# Patient Record
Sex: Female | Born: 1935 | Race: White | Hispanic: No | Smoking: Never smoker
Health system: Southern US, Community
[De-identification: ages and names within clinical notes are randomized; demographics above are authoritative.]

## PROBLEM LIST (undated history)

## (undated) DIAGNOSIS — J45909 Unspecified asthma, uncomplicated: Secondary | ICD-10-CM

## (undated) DIAGNOSIS — D649 Anemia, unspecified: Secondary | ICD-10-CM

## (undated) DIAGNOSIS — G47419 Narcolepsy without cataplexy: Secondary | ICD-10-CM

## (undated) DIAGNOSIS — J189 Pneumonia, unspecified organism: Secondary | ICD-10-CM

## (undated) DIAGNOSIS — K649 Unspecified hemorrhoids: Secondary | ICD-10-CM

## (undated) DIAGNOSIS — Z9889 Other specified postprocedural states: Secondary | ICD-10-CM

## (undated) DIAGNOSIS — K5790 Diverticulosis of intestine, part unspecified, without perforation or abscess without bleeding: Secondary | ICD-10-CM

## (undated) DIAGNOSIS — K635 Polyp of colon: Secondary | ICD-10-CM

## (undated) DIAGNOSIS — F32A Depression, unspecified: Secondary | ICD-10-CM

## (undated) DIAGNOSIS — G459 Transient cerebral ischemic attack, unspecified: Secondary | ICD-10-CM

## (undated) DIAGNOSIS — M51369 Other intervertebral disc degeneration, lumbar region without mention of lumbar back pain or lower extremity pain: Secondary | ICD-10-CM

## (undated) DIAGNOSIS — I499 Cardiac arrhythmia, unspecified: Secondary | ICD-10-CM

## (undated) DIAGNOSIS — E039 Hypothyroidism, unspecified: Secondary | ICD-10-CM

## (undated) DIAGNOSIS — T4145XA Adverse effect of unspecified anesthetic, initial encounter: Secondary | ICD-10-CM

## (undated) DIAGNOSIS — I2510 Atherosclerotic heart disease of native coronary artery without angina pectoris: Secondary | ICD-10-CM

## (undated) DIAGNOSIS — M5136 Other intervertebral disc degeneration, lumbar region: Secondary | ICD-10-CM

## (undated) DIAGNOSIS — I10 Essential (primary) hypertension: Secondary | ICD-10-CM

## (undated) DIAGNOSIS — T8859XA Other complications of anesthesia, initial encounter: Secondary | ICD-10-CM

## (undated) DIAGNOSIS — K219 Gastro-esophageal reflux disease without esophagitis: Secondary | ICD-10-CM

## (undated) DIAGNOSIS — I639 Cerebral infarction, unspecified: Secondary | ICD-10-CM

## (undated) DIAGNOSIS — Z87442 Personal history of urinary calculi: Secondary | ICD-10-CM

## (undated) DIAGNOSIS — Z950 Presence of cardiac pacemaker: Secondary | ICD-10-CM

## (undated) DIAGNOSIS — F419 Anxiety disorder, unspecified: Secondary | ICD-10-CM

## (undated) DIAGNOSIS — R112 Nausea with vomiting, unspecified: Secondary | ICD-10-CM

## (undated) DIAGNOSIS — D126 Benign neoplasm of colon, unspecified: Secondary | ICD-10-CM

## (undated) HISTORY — PX: EYE SURGERY: SHX253

## (undated) HISTORY — DX: Transient cerebral ischemic attack, unspecified: G45.9

## (undated) HISTORY — DX: Other intervertebral disc degeneration, lumbar region: M51.36

## (undated) HISTORY — DX: Essential (primary) hypertension: I10

## (undated) HISTORY — DX: Polyp of colon: K63.5

## (undated) HISTORY — DX: Narcolepsy without cataplexy: G47.419

## (undated) HISTORY — PX: ABDOMINAL HYSTERECTOMY: SHX81

## (undated) HISTORY — PX: BACK SURGERY: SHX140

## (undated) HISTORY — DX: Hypothyroidism, unspecified: E03.9

## (undated) HISTORY — PX: OTHER SURGICAL HISTORY: SHX169

## (undated) HISTORY — DX: Unspecified hemorrhoids: K64.9

## (undated) HISTORY — DX: Cerebral infarction, unspecified: I63.9

## (undated) HISTORY — DX: Benign neoplasm of colon, unspecified: D12.6

## (undated) HISTORY — DX: Other intervertebral disc degeneration, lumbar region without mention of lumbar back pain or lower extremity pain: M51.369

## (undated) HISTORY — DX: Unspecified asthma, uncomplicated: J45.909

## (undated) HISTORY — PX: BREAST ENHANCEMENT SURGERY: SHX7

## (undated) HISTORY — DX: Diverticulosis of intestine, part unspecified, without perforation or abscess without bleeding: K57.90

## (undated) SURGERY — REMOVAL, IMPLANT, BREAST
Anesthesia: General | Laterality: Right

---
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
## 2003-05-27 DIAGNOSIS — K635 Polyp of colon: Secondary | ICD-10-CM

## 2003-05-27 HISTORY — PX: COLONOSCOPY: SHX174

## 2003-05-27 HISTORY — DX: Polyp of colon: K63.5

## 2003-11-29 ENCOUNTER — Inpatient Hospital Stay (HOSPITAL_COMMUNITY): Admission: EM | Admit: 2003-11-29 | Discharge: 2003-12-02 | Payer: Self-pay | Admitting: Emergency Medicine

## 2003-11-29 ENCOUNTER — Ambulatory Visit (HOSPITAL_COMMUNITY): Admission: RE | Admit: 2003-11-29 | Discharge: 2003-11-29 | Payer: Self-pay | Admitting: Family Medicine

## 2003-12-28 ENCOUNTER — Encounter: Admission: RE | Admit: 2003-12-28 | Discharge: 2003-12-28 | Payer: Self-pay | Admitting: Family Medicine

## 2004-01-01 ENCOUNTER — Ambulatory Visit (HOSPITAL_COMMUNITY): Admission: RE | Admit: 2004-01-01 | Discharge: 2004-01-01 | Payer: Self-pay

## 2004-01-04 ENCOUNTER — Ambulatory Visit (HOSPITAL_COMMUNITY): Admission: RE | Admit: 2004-01-04 | Discharge: 2004-01-04 | Payer: Self-pay | Admitting: General Surgery

## 2004-02-05 ENCOUNTER — Ambulatory Visit (HOSPITAL_COMMUNITY): Admission: RE | Admit: 2004-02-05 | Discharge: 2004-02-05 | Payer: Self-pay | Admitting: Ophthalmology

## 2004-04-02 ENCOUNTER — Ambulatory Visit (HOSPITAL_COMMUNITY): Admission: RE | Admit: 2004-04-02 | Discharge: 2004-04-02 | Payer: Self-pay | Admitting: Family Medicine

## 2004-09-12 ENCOUNTER — Ambulatory Visit: Payer: Self-pay | Admitting: Orthopedic Surgery

## 2004-10-16 ENCOUNTER — Emergency Department (HOSPITAL_COMMUNITY): Admission: EM | Admit: 2004-10-16 | Discharge: 2004-10-16 | Payer: Self-pay | Admitting: Emergency Medicine

## 2004-10-16 ENCOUNTER — Ambulatory Visit (HOSPITAL_COMMUNITY): Admission: RE | Admit: 2004-10-16 | Discharge: 2004-10-16 | Payer: Self-pay | Admitting: Ophthalmology

## 2004-10-17 ENCOUNTER — Ambulatory Visit: Payer: Self-pay | Admitting: Orthopedic Surgery

## 2004-10-31 ENCOUNTER — Ambulatory Visit: Payer: Self-pay | Admitting: Orthopedic Surgery

## 2004-12-05 ENCOUNTER — Ambulatory Visit: Payer: Self-pay | Admitting: Orthopedic Surgery

## 2005-01-01 ENCOUNTER — Encounter: Admission: RE | Admit: 2005-01-01 | Discharge: 2005-01-01 | Payer: Self-pay | Admitting: Neurology

## 2005-01-16 ENCOUNTER — Ambulatory Visit: Payer: Self-pay | Admitting: Orthopedic Surgery

## 2005-02-12 ENCOUNTER — Emergency Department (HOSPITAL_COMMUNITY): Admission: EM | Admit: 2005-02-12 | Discharge: 2005-02-12 | Payer: Self-pay | Admitting: Emergency Medicine

## 2005-05-26 DIAGNOSIS — K649 Unspecified hemorrhoids: Secondary | ICD-10-CM

## 2005-05-26 HISTORY — PX: COLONOSCOPY: SHX174

## 2005-05-26 HISTORY — DX: Unspecified hemorrhoids: K64.9

## 2005-05-31 ENCOUNTER — Ambulatory Visit: Payer: Self-pay | Admitting: *Deleted

## 2005-06-02 ENCOUNTER — Inpatient Hospital Stay (HOSPITAL_COMMUNITY): Admission: EM | Admit: 2005-06-02 | Discharge: 2005-06-03 | Payer: Self-pay | Admitting: Emergency Medicine

## 2005-06-06 ENCOUNTER — Encounter (HOSPITAL_COMMUNITY): Admission: RE | Admit: 2005-06-06 | Discharge: 2005-07-06 | Payer: Self-pay | Admitting: Internal Medicine

## 2005-07-07 ENCOUNTER — Encounter (HOSPITAL_COMMUNITY): Admission: RE | Admit: 2005-07-07 | Discharge: 2005-08-06 | Payer: Self-pay | Admitting: Internal Medicine

## 2005-07-12 ENCOUNTER — Emergency Department (HOSPITAL_COMMUNITY): Admission: EM | Admit: 2005-07-12 | Discharge: 2005-07-12 | Payer: Self-pay | Admitting: Emergency Medicine

## 2005-07-22 ENCOUNTER — Ambulatory Visit (HOSPITAL_COMMUNITY): Admission: RE | Admit: 2005-07-22 | Discharge: 2005-07-22 | Payer: Self-pay | Admitting: Neurology

## 2005-08-11 ENCOUNTER — Encounter (HOSPITAL_COMMUNITY): Admission: RE | Admit: 2005-08-11 | Discharge: 2005-09-10 | Payer: Self-pay | Admitting: Internal Medicine

## 2005-08-15 ENCOUNTER — Ambulatory Visit: Payer: Self-pay | Admitting: *Deleted

## 2005-08-20 ENCOUNTER — Ambulatory Visit (HOSPITAL_COMMUNITY): Admission: RE | Admit: 2005-08-20 | Discharge: 2005-08-20 | Payer: Self-pay | Admitting: *Deleted

## 2005-08-20 ENCOUNTER — Ambulatory Visit: Payer: Self-pay | Admitting: *Deleted

## 2005-08-21 ENCOUNTER — Ambulatory Visit (HOSPITAL_COMMUNITY): Admission: RE | Admit: 2005-08-21 | Discharge: 2005-08-21 | Payer: Self-pay | Admitting: *Deleted

## 2005-08-26 ENCOUNTER — Ambulatory Visit: Payer: Self-pay | Admitting: *Deleted

## 2005-12-09 ENCOUNTER — Emergency Department (HOSPITAL_COMMUNITY): Admission: EM | Admit: 2005-12-09 | Discharge: 2005-12-09 | Payer: Self-pay | Admitting: Emergency Medicine

## 2005-12-10 ENCOUNTER — Ambulatory Visit: Payer: Self-pay | Admitting: Orthopedic Surgery

## 2005-12-23 ENCOUNTER — Inpatient Hospital Stay (HOSPITAL_COMMUNITY): Admission: EM | Admit: 2005-12-23 | Discharge: 2005-12-29 | Payer: Self-pay | Admitting: Emergency Medicine

## 2005-12-26 ENCOUNTER — Encounter: Payer: Self-pay | Admitting: Neurology

## 2005-12-29 ENCOUNTER — Inpatient Hospital Stay: Admission: AD | Admit: 2005-12-29 | Discharge: 2006-01-17 | Payer: Self-pay | Admitting: Internal Medicine

## 2005-12-29 ENCOUNTER — Ambulatory Visit: Payer: Self-pay | Admitting: Orthopedic Surgery

## 2005-12-30 ENCOUNTER — Ambulatory Visit (HOSPITAL_COMMUNITY): Admission: RE | Admit: 2005-12-30 | Discharge: 2005-12-30 | Payer: Self-pay | Admitting: Internal Medicine

## 2006-01-22 ENCOUNTER — Ambulatory Visit: Payer: Self-pay | Admitting: Orthopedic Surgery

## 2006-02-04 ENCOUNTER — Ambulatory Visit: Payer: Self-pay | Admitting: Gastroenterology

## 2006-02-26 ENCOUNTER — Ambulatory Visit: Payer: Self-pay | Admitting: Gastroenterology

## 2006-03-10 ENCOUNTER — Ambulatory Visit: Payer: Self-pay | Admitting: Gastroenterology

## 2006-03-10 ENCOUNTER — Ambulatory Visit (HOSPITAL_COMMUNITY): Admission: RE | Admit: 2006-03-10 | Discharge: 2006-03-10 | Payer: Self-pay | Admitting: Gastroenterology

## 2006-05-26 DIAGNOSIS — D126 Benign neoplasm of colon, unspecified: Secondary | ICD-10-CM

## 2006-05-26 HISTORY — PX: COLONOSCOPY: SHX174

## 2006-05-26 HISTORY — DX: Benign neoplasm of colon, unspecified: D12.6

## 2006-06-22 ENCOUNTER — Ambulatory Visit: Payer: Self-pay | Admitting: Cardiology

## 2006-06-22 ENCOUNTER — Inpatient Hospital Stay (HOSPITAL_COMMUNITY): Admission: EM | Admit: 2006-06-22 | Discharge: 2006-06-26 | Payer: Self-pay | Admitting: Emergency Medicine

## 2006-07-29 ENCOUNTER — Ambulatory Visit: Payer: Self-pay | Admitting: Cardiology

## 2006-09-09 ENCOUNTER — Ambulatory Visit (HOSPITAL_COMMUNITY): Admission: RE | Admit: 2006-09-09 | Discharge: 2006-09-09 | Payer: Self-pay | Admitting: Internal Medicine

## 2006-09-21 ENCOUNTER — Ambulatory Visit (HOSPITAL_COMMUNITY): Admission: RE | Admit: 2006-09-21 | Discharge: 2006-09-21 | Payer: Self-pay | Admitting: Internal Medicine

## 2006-10-09 ENCOUNTER — Observation Stay (HOSPITAL_COMMUNITY): Admission: RE | Admit: 2006-10-09 | Discharge: 2006-10-10 | Payer: Self-pay | Admitting: Urology

## 2006-11-02 ENCOUNTER — Ambulatory Visit (HOSPITAL_COMMUNITY): Admission: RE | Admit: 2006-11-02 | Discharge: 2006-11-02 | Payer: Self-pay | Admitting: Internal Medicine

## 2006-11-17 ENCOUNTER — Ambulatory Visit (HOSPITAL_COMMUNITY): Admission: RE | Admit: 2006-11-17 | Discharge: 2006-11-17 | Payer: Self-pay | Admitting: Internal Medicine

## 2006-11-18 ENCOUNTER — Encounter (HOSPITAL_COMMUNITY): Admission: RE | Admit: 2006-11-18 | Discharge: 2006-12-18 | Payer: Self-pay | Admitting: Urology

## 2006-11-30 ENCOUNTER — Encounter: Payer: Self-pay | Admitting: Gastroenterology

## 2006-11-30 ENCOUNTER — Ambulatory Visit (HOSPITAL_COMMUNITY): Admission: RE | Admit: 2006-11-30 | Discharge: 2006-11-30 | Payer: Self-pay | Admitting: Gastroenterology

## 2006-11-30 ENCOUNTER — Ambulatory Visit: Payer: Self-pay | Admitting: Gastroenterology

## 2007-02-18 ENCOUNTER — Ambulatory Visit (HOSPITAL_COMMUNITY): Admission: RE | Admit: 2007-02-18 | Discharge: 2007-02-18 | Payer: Self-pay | Admitting: Internal Medicine

## 2008-04-08 ENCOUNTER — Encounter: Admission: RE | Admit: 2008-04-08 | Discharge: 2008-04-08 | Payer: Self-pay | Admitting: Neurology

## 2008-05-26 HISTORY — PX: EYE SURGERY: SHX253

## 2008-07-02 ENCOUNTER — Emergency Department (HOSPITAL_COMMUNITY): Admission: EM | Admit: 2008-07-02 | Discharge: 2008-07-02 | Payer: Self-pay | Admitting: Emergency Medicine

## 2008-12-19 ENCOUNTER — Encounter: Admission: RE | Admit: 2008-12-19 | Discharge: 2008-12-19 | Payer: Self-pay | Admitting: Internal Medicine

## 2008-12-21 ENCOUNTER — Encounter: Admission: RE | Admit: 2008-12-21 | Discharge: 2008-12-21 | Payer: Self-pay | Admitting: Internal Medicine

## 2009-01-03 ENCOUNTER — Observation Stay (HOSPITAL_COMMUNITY): Admission: EM | Admit: 2009-01-03 | Discharge: 2009-01-05 | Payer: Self-pay | Admitting: Emergency Medicine

## 2009-01-04 ENCOUNTER — Encounter (INDEPENDENT_AMBULATORY_CARE_PROVIDER_SITE_OTHER): Payer: Self-pay | Admitting: Internal Medicine

## 2009-02-21 ENCOUNTER — Ambulatory Visit (HOSPITAL_COMMUNITY): Admission: RE | Admit: 2009-02-21 | Discharge: 2009-02-21 | Payer: Self-pay | Admitting: Internal Medicine

## 2009-07-09 ENCOUNTER — Encounter: Admission: RE | Admit: 2009-07-09 | Discharge: 2009-07-09 | Payer: Self-pay | Admitting: Internal Medicine

## 2010-03-14 ENCOUNTER — Ambulatory Visit (HOSPITAL_COMMUNITY): Admission: RE | Admit: 2010-03-14 | Discharge: 2010-03-14 | Payer: Self-pay | Admitting: Internal Medicine

## 2010-03-20 ENCOUNTER — Ambulatory Visit (HOSPITAL_COMMUNITY): Admission: RE | Admit: 2010-03-20 | Discharge: 2010-03-20 | Payer: Self-pay | Admitting: Internal Medicine

## 2010-06-15 ENCOUNTER — Encounter: Payer: Self-pay | Admitting: Family Medicine

## 2010-06-16 ENCOUNTER — Encounter: Payer: Self-pay | Admitting: Neurology

## 2010-08-31 LAB — PROTIME-INR
INR: 3.2 — ABNORMAL HIGH (ref 0.00–1.49)
Prothrombin Time: 24 seconds — ABNORMAL HIGH (ref 11.6–15.2)
Prothrombin Time: 32.4 seconds — ABNORMAL HIGH (ref 11.6–15.2)

## 2010-08-31 LAB — CBC
HCT: 45.5 % (ref 36.0–46.0)
Hemoglobin: 15 g/dL (ref 12.0–15.0)
MCHC: 33.1 g/dL (ref 30.0–36.0)
MCV: 87.9 fL (ref 78.0–100.0)
Platelets: 201 10*3/uL (ref 150–400)
RDW: 15 % (ref 11.5–15.5)

## 2010-08-31 LAB — BASIC METABOLIC PANEL
BUN: 19 mg/dL (ref 6–23)
BUN: 22 mg/dL (ref 6–23)
CO2: 29 mEq/L (ref 19–32)
CO2: 30 mEq/L (ref 19–32)
Calcium: 9 mg/dL (ref 8.4–10.5)
Chloride: 107 mEq/L (ref 96–112)
GFR calc non Af Amer: >60 mL/min (ref >60)
Glucose, Bld: 104 mg/dL — ABNORMAL HIGH (ref 70–99)
Glucose, Bld: 96 mg/dL (ref 70–99)
Potassium: 4 mEq/L (ref 3.5–5.1)
Potassium: 5.5 mEq/L — ABNORMAL HIGH (ref 3.5–5.1)
Sodium: 140 mEq/L (ref 135–145)
Sodium: 142 mEq/L (ref 135–145)

## 2010-08-31 LAB — LIPID PANEL
Cholesterol: 136 mg/dL (ref 0–200)
HDL: 55 mg/dL (ref >39)
LDL Cholesterol: 65 mg/dL (ref 0–99)
Total CHOL/HDL Ratio: 2.5 RATIO
VLDL: 16 mg/dL (ref 0–40)

## 2010-08-31 LAB — DIFFERENTIAL
Basophils Absolute: 0.1 10*3/uL (ref 0.0–0.1)
Eosinophils Absolute: 0.2 10*3/uL (ref 0.0–0.7)
Eosinophils Relative: 3 % (ref 0–5)
Lymphocytes Relative: 34 % (ref 12–46)
Monocytes Absolute: 0.3 10*3/uL (ref 0.1–1.0)

## 2010-09-10 LAB — COMPREHENSIVE METABOLIC PANEL
ALT: 16 U/L (ref 0–35)
Alkaline Phosphatase: 51 U/L (ref 39–117)
CO2: 32 mEq/L (ref 19–32)
GFR calc non Af Amer: 55 mL/min — ABNORMAL LOW (ref >60)
Glucose, Bld: 103 mg/dL — ABNORMAL HIGH (ref 70–99)
Potassium: 3.8 mEq/L (ref 3.5–5.1)
Sodium: 143 mEq/L (ref 135–145)
Total Protein: 6.6 g/dL (ref 6.0–8.3)

## 2010-09-10 LAB — DIFFERENTIAL
Basophils Relative: 0 % (ref 0–1)
Eosinophils Absolute: 0.2 10*3/uL (ref 0.0–0.7)
Eosinophils Relative: 2 % (ref 0–5)
Monocytes Relative: 7 % (ref 3–12)
Neutrophils Relative %: 68 % (ref 43–77)

## 2010-09-10 LAB — CBC
Hemoglobin: 13.3 g/dL (ref 12.0–15.0)
RBC: 4.55 MIL/uL (ref 3.87–5.11)

## 2010-09-10 LAB — POCT CARDIAC MARKERS: Myoglobin, poc: 59 ng/mL (ref 12–200)

## 2010-10-08 NOTE — Consult Note (Signed)
NAME:  Cynthia, [name]          ACCOUNT NO.:  [id number]   MEDICAL RECORD NO.:  [id number]          PATIENT TYPE:  OBV   LOCATION:                               FACILITY:  [facility]   PHYSICIAN:  Pramod P. Pearlean Brownie, MD    DATE OF BIRTH:  1935/06/27   DATE OF CONSULTATION:  DATE OF DISCHARGE:                                 CONSULTATION   REFERRING PHYSICIAN:  Samuel Jester, DO   REASON FOR REFERRAL:  TIA.   HISTORY OF PRESENT ILLNESS:  [name] is a 75 year old Caucasian  lady who had elective surgery on her right eye [facility].  The surgery began at 1:30 p.m.  and she was sedated with Versed.  Then, surgery finished at around 2:20.  She noticed she had expressive speech difficulties, trouble getting  words out and talking.  The symptoms persisted for at least 15-20  minutes and the patient was transferred to [facility] and at about an  hour or so from the onset she noticed her speech has recovered  completely.  She denied any trouble understanding people or any  increased visual weakness, though she does have some mild right facial  droop as a residual from her previous stroke.  She denied any extremity  weakness, numbness, headache, double vision, or loss of vision.   PAST MEDICAL HISTORY:  Significant for left brain subcortical infarct as  well as a flurry of 9 TIAs in 2007.  She was initially on aspirin,  subsequently switched to Coumadin and has remained on Coumadin and in  fact was taking it today prior to her eye surgery.  Her past medical  history significant for hypertension, coronary artery disease, stroke,  TIAs, asthma, hypothyroidism, right eye surgery x4, previous craniotomy  for head injury.   PAST SURGICAL HISTORY:  Appendicectomy, hysterectomy, craniotomy, and  right eye surgery.   HOME MEDICATIONS:  Coumadin, Lasix, levothyroxine, potassium, Prilosec,  simvastatin, and amlodipine.   MEDICATION  ALLERGIES:  EPINEPHRINE causes anaphylaxis.  VICODIN causes  hallucinations.  DEMEROL causes headache.   SOCIAL HISTORY:  The patient lives alone.  She is independent with  activities of daily living.  She does not smoke.  She has occasional  glass of wine.   REVIEW OF SYSTEMS:  Negative for recent fever, cough, chest pain,  diarrhea, or other illness.   PHYSICAL EXAMINATION:  GENERAL:  A pleasant middle-aged Caucasian lady  who is currently not in distress.  VITAL SIGNS:  She is afebrile.  Pulse rate is 54 per minute and regular.  Blood pressure on admission 142/65, it is not down to 130/70.  Respiratory rate 16 per minute.  Distal pulses well felt.  HEENT:  Head is nontraumatic.  Hearing is normal.  She has recent  bandage on the right eye from her surgery.  NECK:  Supple.  There is no bruit.  CARDIAC:  Regular heart sounds.  No murmur or gallop.  LUNGS:  Clear to auscultation.  NEUROLOGICAL:  She is pleasant, awake, alert, and cooperative with no  aphasia, apraxia, or dysarthria.  Right eye  movements not checked due to  surgical bandage.  She has full range of eye movements in the left eye.  Visual acuity and fields are adequate in the left eye.  She has minimum  right lower facial symmetry when she smiles.  Tongue is midline.  Motor  system exam reveals no upper extremity drift.  There is minimum right  lower extremity drift.  She has symmetric upper extremity strength.  There is mild weakness of hip flexors on the right.  Deep tendon  reflexes 2+ and symmetric.  Right plantar is equivocal, left is  downgoing.  Sensation is intact.  Coordination is slow, but accurate on  the right.  Gait was not tested.   DATA REVIEWED:  Noncontrast CAT scan of the head done today reveals no  acute abnormality.   LABORATORY DATA:  Electrolytes are significant only for potassium of  5.5.  Coagulation labs are pending.   IMPRESSION:  A 75-hour lady with transient episode with expressive   language difficulties, likely left hemispheric transient ischemic  attack.  This occurred in the immediate postoperative period following  right eye surgery under sedation.  My review of operative records  revealed that there was transient hypotension with systolic blood  pressure down to 80s, but it was not sustained.   PLAN:  I had a long discussion with the patient in regards to her risk  for future strokes and the need for secondary prevention.  She has  failed aspirin and now Coumadin.  I do not see there is a long-term  indication for Coumadin as she does not have documented atrial  fibrillation, mechanical heart valve, DVT, or pulmonary embolism.  She  has had the present TIA despite being on Coumadin and hence I think we  need to look at alternative strategies of secondary prevention.  I would  suggest she consider participating in the POINT trial comparing aspirin  alone versus aspirin and Plavix.  She appears interested.  We will give  information to review and see if she wants to participate.  She will be  admitted for further evaluation.  We will check MRI scan of the brain,  echocardiogram, Doppler studies, lipid profile, and hemoglobin A1c.  We  will follow the patient.  Kindly call for questions.           ______________________________  Sunny Schlein. Pearlean Brownie, MD     PPS/MEDQ  D:  01/03/2009  T:  01/04/2009  Job:  [id number]

## 2010-10-08 NOTE — H&P (Signed)
NAME:  [name], [name]          ACCOUNT NO.:  [id number]   MEDICAL RECORD NO.:  [id number]          PATIENT TYPE:  AMB   LOCATION:  DAY                           FACILITY:  [facility]   PHYSICIAN:  Ky Barban, M.D.DATE OF BIRTH:  [birth year]   DATE OF ADMISSION:  10/09/2006  DATE OF DISCHARGE:  LH                              HISTORY & PHYSICAL   This patient is coming tomorrow to have cystoretrograde pyelogram done.   CHIEF COMPLAINT:  Left flank pain.   HISTORY:  A 75 year old female referred to me by Dr. Margo Aye.  The patient  for the last 2-3 months has had on and off pain in the left flank.  A CT  scan was done which showed that she has moderate left hydronephrosis.  She does have a stone in the left kidney also, but it is in the calix,  not in the pelvis or UPJ.  There is a suspicion that she might have UPJ  obstruction on that side.  On that CT, she also was found to have a  lesion in the liver.  MRI was done and it turns out to be hemangioma.  She denies any gross hematuria, fever, or chills.  No voiding  difficulty.   PAST MEDICAL HISTORY:  1. Hysterectomy at the age of 72.  She also tells me that she had      radiotherapy after that, so obviously there was some kind of      cancer, but there is no evidence of recurrence so far.  2. She also had appendectomy.  3. 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  4. She also had several surgeries on her eyes.  5. She had breast implants removed and reimplanted again.  6. She had twice big CVA's, but recovered from several mini strokes.      No hypertension or diabetes.   FAMILY HISTORY:  Father had colon cancer and prostate cancer.  Mother  had pancreatic and kidney cancer.   MEDICATIONS:  1. Coumadin.  2. Toprol.  3. Furosemide.  4. Potassium chloride.  5. Crestor.  6. Levothyroxine.  7. Prilosec.  8. NitroQuick p.r.n.   SUPPLEMENTS:  1. Zinc.  2. Vitamin C.  3. Vitamin E.  4. Selenium.  5. Biotin.  6. Magnesium B Complex plus B12.  7. Vitamin D.  8. Calcium.  9. Flax oil.  10.Iron.   PERSONAL HISTORY:  She does not smoke or drink.   ALLERGIES:  She is allergic to ADRENALINE, DEMEROL, VICODIN.   REVIEW OF SYSTEMS:  Negative.   PHYSICAL EXAMINATION:  GENERAL:  A well-developed female in no acute  distress.  She was alert and oriented.  HEENT:  Negative.  CHEST:  Symmetrical.  HEART:  Regular rate and rhythm, no murmur.  ABDOMEN:  Soft, flat.  Liver, spleen, and kidneys are not palpable.  VITAL SIGNS:  Blood pressure 132/82, temperature 98.3.  PELVIC:  There is no CVA tenderness.  No adnexal mass  or tenderness.   IMPRESSION:  Left flank pain, left renal calculus, small 5 mm  nonobstructing possible left UPJ obstruction.   PLAN:  Cystoscopy, left retrograde pyelogram, possible double J stent  insertion.  We discussed the procedure and its limitations.  She  understands and wanted me to go ahead and schedule it.   I appreciate Dr. Margo Aye for letting me to see this patient.      Ky Barban, M.D.  Electronically Signed     MIJ/MEDQ  D:  10/08/2006  T:  10/08/2006  Job:  [id number]   cc:   Catalina Pizza, M.D.  Fax: [phone]

## 2010-10-08 NOTE — Op Note (Signed)
NAMEJOURDIN, [name]          ACCOUNT NO.:  [id number]   MEDICAL RECORD NO.:  [id number]          PATIENT TYPE:  AMB   LOCATION:  DAY                           FACILITY:  [facility]   PHYSICIAN:  Ky Barban, M.D.DATE OF BIRTH:  [birth year]   DATE OF PROCEDURE:  10/09/2006  DATE OF DISCHARGE:                               OPERATIVE REPORT   PREOPERATIVE DIAGNOSIS:  Left ureteropelvic junction obstruction, left  flank pain.   POSTOPERATIVE DIAGNOSIS:  Left ureteropelvic junction obstruction, left  flank pain.   PROCEDURE:  Cystoscopy, left retrograde pyelogram, insertion of double J  stent left side.  No strings attached.   ANESTHESIA:  Spinal.   DESCRIPTION OF PROCEDURE:  Patient with spinal anesthesia, placed in  lithotomy position after usual prep and drape.  A #25 cystoscope  introduced into the bladder.  It is inspected.  Left ureteral orifice  catheter as well as wedge catheter, Hypaque was injected under  fluoroscopic control.  The dye goes up into the upper ureter which looks  fine.  At the UPJ there is obstruction and the dye then goes into the  renal pelvis which is markedly dilated along with the calyces.  Although  some of the calyces are still cup-shape.  There is a small 5 mm stone in  the lower pole calyx as seen on CT but I again see very nicely over here  on this x-ray.  At this point, under fluoroscopy I can see the dye going  into the renal pelvis into the ureter.  There is not complete  obstruction but I decided to put a double J stent and see if it relieves  her pain.  A guidewire is passed up into the renal pelvis over the  guidewire.  A 5 French 24 cm double J stent is positioned between the  renal pelvis and the bladder.  Guidewire is removed, ___________  in the  bladder and the renal pelvis is obtained.  All instruments removed.  Patient left the operating room in satisfactory condition.      Ky Barban, M.D.  Electronically  Signed     MIJ/MEDQ  D:  10/09/2006  T:  10/09/2006  Job:  [id number]   cc:   Dr. __________ .

## 2010-10-08 NOTE — H&P (Signed)
[name], [name]          ACCOUNT NO.:  [id number]   MEDICAL RECORD NO.:  [id number]          PATIENT TYPE:  OBV   LOCATION:  3713                         FACILITY:  [facility]   PHYSICIAN:  Ruthy Dick, MD    DATE OF BIRTH:  05-29-1935   DATE OF ADMISSION:  01/03/2009  DATE OF DISCHARGE:                              HISTORY & PHYSICAL   The patient was seen and examined in the emergency room.   CHIEF COMPLAINTS:  Slurred speech today postop.   HISTORY OF PRESENT ILLNESS:  [name] is a pleasant 75 year old  lady with past medical history significant for multiple CVAs which has  led to right-sided hemiparesis, asthma, hypertension, hypothyroidism,  narcolepsy, and TIAs who was brought to the emergency room after she had  eye surgery today.  Specifically, the patient had vitrectomy of right  eye and postop was given pain medication.  After injection of this  medication which the patient is not sure what it was, the patient  started having episodes of slurred speech and inability to speak fast  enough.  Because of this and the fact that she has had previous history  of strokes, she was brought to the emergency room for further  evaluation.  According to the emergency room documentation, in the  emergency room she continued to have slow speech and her thought  processes seemed to be slow.  By the time I saw her, she said her speech  was back to normal and at that time she had already been seen by the  neurology team.  She denies having any other symptoms during this time.  No chest pain.  No shortness of breath.  No abdominal pain.  No nausea.  No vomiting.  No diarrhea.  No constipation.   PAST MEDICAL HISTORY:  Asthma, hypertension, hypothyroidism, narcolepsy,  recurrent stroke, recurrent TIA.   PAST SURGICAL HISTORY:  Core decompression surgery, multiple eye  surgeries, breast implant.   MEDICATIONS:  1. Coumadin 5 mg daily.  2. Lasix 40 mg daily.  3.  Levothyroxine 50 mcg daily.  4. Potassium chloride 20 mEq daily.  5. Prilosec 20 mg daily.  6. Simvastatin 10 mg daily.  7. Toprol-XL 25 mg daily.   ALLERGIES:  The patient is allergic to DEMEROL, EPINEPHRINE, and  VICODIN.   SOCIAL HISTORY:  Denies drinking, smoking, and illicit drug abuse.   FAMILY HISTORY:  Noncontributory.  The patient does mention that his  father did have hypertension and the mother did have also hypertension.   REVIEW OF SYSTEMS:  A 12-point review of system was done and was  negative except noted in the history of present illness.  Additionally,  the patient denies chest pain, shortness of breath, abdominal pain,  nausea, vomiting, diarrhea, dysuria, frequency, syncope, palpitations.  Also denies rash.   PHYSICAL EXAMINATION:  GENERAL:  The patient is seen and examined in the  emergency room.  She is alert and oriented x3, in no acute  cardiopulmonary distress and no painful distress either.  Speech is  normal at this time.  VITAL SIGNS:  Temperature 97.4, pulse 68, respirations 14, blood  pressure 112/69, and saturating 100% on room air.  HEENT:  Normocephalic.  The patient has a right eye patch on the right  eye postop.  Pupils are reactive on the left.  Ocular muscles normal.  NECK:  Supple.  No JVD.  No lymphadenopathy.  No thyromegaly.  CHEST:  Clear to auscultation bilaterally.  No rhonchi.  No rales.  No  wheezing.  ABDOMEN:  Soft, nontender.  No hepatosplenomegaly.  EXTREMITIES:  No clubbing.  No cyanosis.  No edema.  CARDIOVASCULAR:  First and second heart sounds only.  No third heart  sounds. No murmurs.  No gallops.  No rubs.  CENTRAL NERVOUS SYSTEM:  Nonfocal.  Cranial nerves intact II through  XII.  The patient however has a right-sided hemiparesis with the power  of about 3-4/5 on the right side.  Power on the left is 5/5.   LABORATORY DATA/INVESTIGATIONS:  CT scan of the brain does not show any  new acute findings.  CBC with differential  is fairly normal.  INR is  2.2.  Sodium is 140, potassium is 5.5, chloride 107, glucose 96, and  bicarb 30.  BUN 22, creatinine 0.80.  EKG shows a normal sinus rhythm  with no acute ST-T wave abnormalities.   ASSESSMENT:  1. Possible transient ischemic attack versus drug reaction postop.  2. Recurrent cerebrovascular accident.  3. Recurrent transient ischemic attack.  4. Hypertension.  5. Hypothyroidism.  6. Asthma.  7. Dyslipidemia.   PLAN OF CARE:  Plan of care will be to have this patient on observation  in the hospital for 24-48 hours, get neuro checks during this time, get  MRI of the brain.  We will also get a 2-D echo and carotid Doppler.  We  will continue Coumadin and since she is already on Coumadin at 2.0 INR,  we do not think we need any further DVT prophylaxis.      Ruthy Dick, MD      Ruthy Dick, MD     GU/MEDQ  D:  01/03/2009  T:  01/04/2009  Job:  [id number]

## 2010-10-08 NOTE — Discharge Summary (Signed)
NAMETYNETTA, BACHMANN          ACCOUNT NO.:  [id number]   MEDICAL RECORD NO.:  [id number]          PATIENT TYPE:  OBV   LOCATION:  3713                         FACILITY:  [facility]   PHYSICIAN:  Ruthy Dick, MD    DATE OF BIRTH:  April 25, 1936   DATE OF ADMISSION:  01/03/2009  DATE OF DISCHARGE:  01/04/2009                               DISCHARGE SUMMARY   REASON FOR ADMISSION:  Expressive aphasia, transient.   FINAL DISCHARGE DIAGNOSES:  1. Transient ischemic attack versus adverse effects to medications.  2. History of recurrent cerebrovascular accidents.  3. History of recurrent transient ischemic attacks.  4. Hypertension.  5. Hypothyroidism.  6. Asthma.  7. Dyslipidemia.  8. Hyperkalemia, resolved.   CONSULTS DURING THIS ADMISSION:  Neurology consult.   PROCEDURES DURING THIS ADMISSION:  1. 2-D echocardiogram, results pending.  2. CT scan of the head that showed no acute intracranial abnormalities      and a right frontal encephalomalacia.  3. MRI of the brain and carotid Dopplers, results of MRI and carotid      Dopplers pending at this time.   BRIEF HISTORY OF PRESENT ILLNESS AND HOSPITAL COURSE:  This is a  pleasant 75 year old lady with past medical history significant for  multiple CVAs, asthma, hypertension, and TIAs, who came in because of  expressive aphasia that happened, status post vitrectomy with Dr.  Luciana Axe.  According to her, she had expressive aphasia, but by the time I  saw the patient last night, her speech was back to baseline.  Today, she  continued to be stable and back at her baseline speech, and a workup is  in progress to rule out possibility of stroke.  As already noted, both  her MRI and carotid Dopplers are pending at this time.  Once these tests  are done and results had, the patient may be able to go home later on  tonight or tomorrow morning.  As already noted, the patient has no  complaints whatsoever today, no chest pain, no shortness of  breath, no  abdominal pain, no nausea, no vomiting, no diarrhea, and no  constipation.   PHYSICAL EXAMINATION:  VITAL SIGNS:  Today, temperature 97.5, pulse 56,  respirations 16, blood pressure 129/72, and saturating 100% on room air.  CHEST:  Clear to auscultation, bilateral.  ABDOMEN:  Soft and nontender.  EXTREMITIES:  No clubbing, no cyanosis, and no edema.  CARDIOVASCULAR:  First and second heart sounds only.  CENTRAL NERVOUS SYSTEM:  The patient has a right-sided hemiplegia with a  pulse of about 4/5, probably up to 5/5 sometimes.  The patient also has  a transparent right eye patch noted on the right eye.  Of note is the  fact that the patient was seen by the neurologist and according to Dr.  Pearlean Brownie, the patient may not really have any clear indication and definite  need for being on Coumadin.  However, we are going to continue this  medication since the patient is being scheduled to see Dr. Anne Hahn, who  would then address this issue from a neurologist point of view.   DISCHARGE MEDICATIONS:  1.  Coumadin 5 mg daily except Wednesday and Sunday when the patient      takes 2.5 mg daily.  Coumadin 2.5 mg daily and Wednesday and      Sunday.  2. Lasix 40 mg daily.  3. Levothyroxine 50 mcg daily.  4. Potassium chloride 10 mEq daily.  5. Zocor 10 mg daily.  6. Toprol-XL 25 mg daily.  7. The patient is also to take eye drops as prescribed by Dr. Luciana Axe.   She is to follow up with Dr. Luciana Axe as scheduled, to follow up with Dr.  Anne Hahn in 1 month.  The patient is to call for this appointment and to  follow up with Dr. Margo Aye in 1 month.  The patient also to call for  appointment.   DISCHARGE TIME:  40 minutes.      Ruthy Dick, MD  Electronically Signed     Ruthy Dick, MD  Electronically Signed    GU/MEDQ  D:  01/04/2009  T:  01/05/2009  Job:  [id number]   cc:   Jillyn Hidden A. Rankin, M.D.  Dr. Margo Aye  Dr. Anne Hahn

## 2010-10-08 NOTE — Op Note (Signed)
NAMEBRECKYN, [name]          ACCOUNT NO.:  [id number]   MEDICAL RECORD NO.:  [id number]          PATIENT TYPE:  AMB   LOCATION:  DAY                           FACILITY:  [facility]   PHYSICIAN:  Kassie Mends, M.D.      DATE OF BIRTH:  15-Feb-1936   DATE OF PROCEDURE:  11/30/2006  DATE OF DISCHARGE:                               OPERATIVE REPORT   REFERRING PHYSICIAN:  Catalina Pizza, M.D.   INDICATION FOR EXAM:  Ms. Brutus is a 75 year old female who presents  with rectal bleeding.  She had a PET scan on January 24 that showed  increased uptake in the distal sigmoid colon.  Radiology recommended  correlation with endoscopy.  She presents for evaluation of this area.  She had a colonoscopy in October 2007, which showed hemorrhoids.   FINDINGS:  1. A 1.5 cm sessile, rectosigmoid, villous-appearing polyp removed via      snare cautery.  2. Rare sigmoid diverticulosis.  3. Moderate internal hemorrhoids.  Otherwise no masses, inflammatory      changes or AVMs seen.   RECOMMENDATIONS:  1. Will call Ms. Mikes with her biopsy report.  Once her biopsy      results are known, then we will determine when her next colonoscopy      should be.  2. She may restart her Coumadin in 5 days.  No aspirin or NSAIDs for 7      days.  3. She should follow a high-fiber diet.  She is given a handout on      high-fiber diet, hemorrhoids, and polyps.  4. Anusol HC pr BID for 14 days. Rx given.  5. If her rectal bleeding persists, then would consider surgical      consultation for hemorrhoidectomy.   MEDICATIONS:  1. Demerol 75 mg IV.  2. Versed 5 mg IV.   PROCEDURE TECHNIQUE:  Physical exam was performed and informed consent  was obtained from the patient after explaining the benefits, risks and  alternatives to the procedure.  The patient was connected to the monitor  and placed in the left lateral position.  Continuous oxygen was provided  by nasal cannula and IV medicine administered through an  indwelling  cannula.  After administration of sedation and rectal exam, the  patient's rectum was intubated  and the scope was advanced under direct visualization to the mid  transverse colon.  The scope was withdrawn slowly by carefully examining  the color, texture, anatomy and integrity of the mucosa on the way out.  The patient was recovered in endoscopy and discharged home in  satisfactory condition.      Kassie Mends, M.D.  Electronically Signed     SM/MEDQ  D:  11/30/2006  T:  11/30/2006  Job:  [id number]   cc:   Catalina Pizza, M.D.  Fax: [phone]

## 2010-10-11 NOTE — Consult Note (Signed)
NAME:  [name], [name]                    ACCOUNT NO.:  [id number]   MEDICAL RECORD NO.:  [id number]                   PATIENT TYPE:  INP   LOCATION:  A226                                 FACILITY:  [facility]   PHYSICIAN:  [place] Bing, M.D.               DATE OF BIRTH:  1935-08-16   DATE OF CONSULTATION:  11/30/2003  DATE OF DISCHARGE:                                   CONSULTATION   REFERRING:  Dr. Evlyn Courier.   HISTORY OF PRESENT ILLNESS:  [age]-year-old woman admitted to [facility] with progressive chest discomfort.  [name] cardiac history  dates back approximately 20 years when she experienced palpitations  associated with chest discomfort.  She reports cardiac catheterization at  that time which apparently did not demonstrate significant disease.  She  describes some blockages, but these appear to be in the peripheral vessels  rather than in the coronaries.  In any case, she has not had substantial  cardiac issues since that time.  Over recent weeks, she has developed  episodes of chest pressure, frequently with associated left jaw discomfort.  There has been some radiation to the left arm.  These typically occur at  rest and last a matter of minutes.  There is nothing she can do to improve  her symptoms.  She used nitroglycerin in the distant past, but not recently.  She did experience similar symptoms during treadmill exercise a few weeks  ago.   Cardiovascular risk factors include hypertension.   PAST MEDICAL HISTORY:  Past medical history is complex.  It includes  hypothyroidism and asthma.  She describes a number of injuries including a  fall late last year for which she is apparently seeking disability.  She  sustained a head injury 10 years ago that required craniotomy.  She had a  traumatic A-V fistula in the left leg that was repaired; I wonder if this  was related to her previous angiography.   SURGICAL PROCEDURES:  Past surgical  procedures include:  1. Appendectomy.  2. Hysterectomy.  3. TMJ surgery.  4. She underwent bilateral breast implants, but these were subsequently     removed.  5. She has had bilateral cataract surgery.  6. She also reports remote blood transfusions -- the reason for these is     unclear.   SOCIAL HISTORY:  Recently moved to [place] from [facility] and  works at [employer]; widowed with 9 children; previously employed as a  Buyer, retail.  Denies use of tobacco products or excessive use of  alcohol.   FAMILY HISTORY:  Mother died from pancreatic cancer; father had colon  cancer; no coronary disease in first-degree relatives.   REVIEW OF SYSTEMS:  Arthralgias in both ankles; discomfort in left hip and  shoulder related to prior fall; GERD symptoms; described some rectal  bleeding a few weeks ago; had bronchospasm that she attributes to asthma as  well as cough a  few weeks ago; all other systems reviewed and are negative.   MEDICATIONS PRIOR TO ADMISSION:  1. Toprol 50 mg daily.  2. Levothyroxine 0.075 mg daily.  3. Furosemide 40 mg daily.  4. Kay Ciel 20 mEq daily.  5. Diltiazem 240 mg daily.  6. Aspirin 325 mg daily.   ALLERGIES:  She reports an allergy to EPINEPHRINE, DEMEROL and VICODIN.   PHYSICAL EXAMINATION:  GENERAL:  On exam, pleasant woman in no acute  distress.  VITAL SIGNS:  The temperature is 96.6, heart rate 60 and regular,  respirations 20, blood pressure 135/75, O2 saturation 97% on 2 L, weight  138.  HEENT:  Normal lids and conjunctivae; anicteric sclerae.  NECK:  No jugular venous distention; no carotid bruits.  ENDOCRINE:  No thyromegaly.  HEMATOPOIETIC:  No cervical, axillary or inguinal adenopathy.  LUNGS:  Clear.  CARDIAC:  Normal first and second heart sounds; modest systolic ejection  murmur.  ABDOMEN:  Soft and nontender; no organomegaly; no masses.  EXTREMITIES:  Distal pulses intact; no edema.   LABORATORY DATA:  Initial  laboratory notable for normal CBC, potassium of  3.2 and negative cardiac markers.   EKG:  Sinus bradycardia; somewhat delayed R wave progression; otherwise  unremarkable.   Echocardiogram:  Mild LVH; normal LV systolic function.   IMPRESSION:  [name] presents with symptoms that are somewhat  worrisome for myocardial ischemia, but atypical in a number of respects,  particularly in the resting occurrence of her symptoms.  It would be  reasonable to proceed directly to coronary angiography; however, that test  is unavailable until at least next Monday.  I believe it would be safe to  perform a stress Cardiolite study tomorrow.  If negative, we could attempt  to manage her symptoms by adjustment of her medications.  Hypertension is  well-controlled.  A lipid profile will also be obtained.   We appreciate the request for consultative services and will be happy to  follow this woman with you.      ___________________________________________                                            [place] Bing, M.D.   RR/MEDQ  D:  11/30/2003  T:  12/01/2003  Job:  [id number]

## 2010-10-11 NOTE — Discharge Summary (Signed)
NAME:  [name], [name]                    ACCOUNT NO.:  [id number]   MEDICAL RECORD NO.:  [id number]                   PATIENT TYPE:  INP   LOCATION:  A226                                 FACILITY:  [facility]   PHYSICIAN:  Jerolyn Shin C. Katrinka Blazing, M.D.                DATE OF BIRTH:  May 18, 1936   DATE OF ADMISSION:  11/29/2003  DATE OF DISCHARGE:  12/02/2003                                 DISCHARGE SUMMARY   DISCHARGE DIAGNOSES:  1. Atypical non-cardiac chest pain, treated, improved.  2. Hypertension.  3. 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  4. Multi-level degenerative disk disease with spondylosis and broad base     disk bulges, contributing to foraminal narrowing of C2-3,  C3-4, C4-5, C5-6, C6-7, C7-T1, T1-T2 and T3-T4.  1. Left paracentral herniated nucleus pulposus with L5-S1 mass effect on     left S1 nerve root and left foraminal herniated nucleus pulposus at L2-3     with mass effect on L2 nerve root.  2. Multi-level degenerative disk disease extending from L1-2 through L5-S1.  3. Chronic venous hypertension, lower extremities with stasis dermatitis.  4. History of asthma, stable.  5. Hypothyroidism.  6. Clinical depression.  7. Unresolved grief.   DISPOSITION:  The patient is discharged to home in stable, improved  condition.   DISCHARGE MEDICATIONS:  1. Toprol XL 50 mg q.d.  2. Diltiazem ER 240 mg q.d.  3. Furosemide 40 mg q.d.  4. Potassium chloride 10 meq q.d.  5. Prilosec 20 mg q.d.  6. Ibuprofen 400 mg t.i.d. with meals.  7. Aspirin 325 mg q.d.  8. Lipitor 20 mg q.d.  9. Levoxyl 75 mcg q.d.  10.      Darvocet N-100 q. 6 hours p.r.n. pain.   SUMMARY:  A 75 year old female admitted for evaluation of chest pain. The  patient gives a history of onset of tightness in her mid chest on Thursday  of the previous week. She had severe tightness, which kept her from going to  sleep. She had to sit up all night because of  pain. By the early morning,  the pain improved and she had mild, vague chest discomfort over the next few  days. Tonight, prior to admission, she had more pain with heaviness in her  chest and she again had a sensation as if someone was sitting on her chest.  She had pain, which radiated to the angle of the jaw and the left ear. The  pain then radiated to her left shoulder and down the outer aspect of her  left arm. She states that she had a similar episode in November of 2004,  which resolved spontaneously. There is a prior history of cardiac  catheterization in her 40's but she does not remember the year. She states  that during that time, she was having some heart irregularities and  tachycardia. She had Holter  monitor placed but she did not know the results  of the Holter monitor. She was started on Cardizem and Toprol at that time  and she has remained on those medications. She has no family history of  heart disease. She does have a history of hypertension. The patient was  admitted for evaluation and cardiology evaluation. It appeared initially  that her chest pain had an element of musculoskeletal origin.   PAST MEDICAL HISTORY:  Which is extensive, is given in the admission note.   PHYSICAL EXAMINATION:  VITAL SIGNS:  Blood pressure 120/80, pulse 72,  respiratory rate 18, O2 saturation was 95% on room air.  GENERAL:  The patient appeared to be depressed.  HEENT:  Head examination revealed operative changes of the right scalp in  the right parietal frontal area. There was some deformity of both jaws and  she had clicking sounds of her TMJ bilaterally. There were operative changes  of both eyes with obvious lens implants.  NECK:  Revealed tenderness to the posterior neck, along the lower aspect,  and tenderness laterally on the left side along the cervical spine. Range of  motion  of the neck appeared to be well preserved.  LUNGS:  Clear.  HEART:  Regular.  ABDOMEN:  Soft with  mild epigastric tenderness.  BACK:  Revealed lower lumbar tenderness in the midline, extending into the  iliac fossa on the left.  EXTREMITIES:  She had tenderness of her left hip to palpation. The patient  had good peripheral pulses. There were chronic changes of stasis dermatitis  and with extensive telangiectasias of both legs.  NEUROLOGIC:  She did not have any deficit. She had some pain with walking  but this was felt to be musculoskeletal.   HOSPITAL COURSE:  The patient was admitted. She had serial cardiac enzymes,  repeat electrocardiogram's, and she was placed on telemetry. An  echocardiogram was ordered and cardiology consultation was obtained. When  seen by cardiology, it was Dr. Marvel Plan impression that her symptoms were  somewhat worrisome for myocardial ischemia but he felt that it was atypical  in its presentation. Her cardiac enzymes were negative for myocardial  ischemia. An adenosine Cardiolite stress test was done and this showed no  ischemia with normal left ventricular function and no wall motion  abnormalities. It was therefore deemed that she had non-cardiac chest pain  and could be further evaluated as an outpatient. Also during this admission,  CT scan of her neck and back were done and this showed extensive  degenerative disk disease as outlined above. The patient's symptoms were  controlled symptomatically and by December 02, 2003, it was felt that she was  stable enough to be discharged home with close followup in the office.     ___________________________________________                                         Dirk Dress. Katrinka Blazing, M.D.   LCS/MEDQ  D:  12/26/2003  T:  12/27/2003  Job:  [id number]

## 2010-10-11 NOTE — Discharge Summary (Signed)
NAMEDIARRA, [name]          ACCOUNT NO.:  [id number]   MEDICAL RECORD NO.:  [id number]          PATIENT TYPE:  INP   LOCATION:  A213                          FACILITY:  [facility]   PHYSICIAN:  Catalina Pizza, M.D.        DATE OF BIRTH:  Jun 08, 1935   DATE OF ADMISSION:  12/23/2005  DATE OF DISCHARGE:  LH                                 DISCHARGE SUMMARY   DISCHARGE DIAGNOSES:  1. Acute cerebrovascular accident in left occipital lobe, as well as left      posterior frontal parasagittal region at the cortical base.  2. Hyperlipidemia.  3. Hypothyroidism.  4. Anxiety and depression.  5. Right foot evulsion fracture.  6. Started on Coumadin therapy.   DISCHARGE MEDICATIONS:  Medications stopped during hospitalization are  Aggrenox, Cardizem LA, and Prempro.   Medications that she is continued on are:  1. Toprol-XL 50 mg p.o. daily.  2. Furosemide 40 mg p.o. daily.  3. Potassium chloride 20 mEq p.o. daily.  4. Aspirin 81 mg p.o. daily.  5. Lovastatin 80 mg p.o. daily.  6. Levothyroxine 75 mcg p.o. daily.  7. Lexapro 5 mg p.o. daily.  8. Isoniazid 300 mg p.o. daily.  9. Coumadin per regimen listed below.  10.Darvocet-N 100 one tablet q.6h. p.r.n. for pain.   May continue all of her over-the-counter regimen including zinc, vitamin C,  calcium plus D 600 mg p.o. daily, vitamin E, biotin, flax seed oil,  magnesium, and vitamin B12 complex.   ALLERGIES:  She is allergic to:  1. EPINEPHRINE which causes her to have anaphylactic shock.  2. DEMEROL causes her to have severe headaches.  3. VICODIN causes confusion.   BRIEF HISTORY OF PRESENT ILLNESS:  On December 23, 2005, [name] woke up  and found to have some numbness and a tingling sensation down her right leg.  She has had previous stroke and TIA-type symptoms before but this did not  resolve on its own and came into the emergency department for further  assessment and had CT scan and MRI confirming acute stroke.   PHYSICAL  EXAMINATION AT TIME OF DISCHARGE:  VITAL SIGNS:  Blood pressure  ranging between 114/60 to 150/76, heart rate between 44 and 53, temperature  is 98.3, respirations are 16-18, she is saturating 98% on room air.  GENERAL:  This is an elderly white female, younger than her stated age, in  no acute distress lying in bed.  HEENT:  Unchanged.  Does have some lid lag which has been there since  previous strokes.  No JVD, no carotid bruits.  Pupils were equal and round,  react to light and accommodation.  Oropharynx is clear, tongue is midline.  HEART:  Regular rate and rhythm, no murmurs, gallops or rubs.  LUNGS:  Clear to auscultation bilaterally.  ABDOMEN:  Soft, nontender, nondistended, positive bowel sounds.  EXTREMITIES:  No clubbing, cyanosis, or edema.  Positive pulses all  extremities.  Does have an Ace wrap and soft cast to right foot.  NEUROLOGIC:  Strength appears to be relatively the same as before.  Does  have some right-sided weakness  compared to the left side.  This has been  chronic and related to previous stroke.  She does have subjectively some  decreased sensation in her toes occasionally but improving from since she  was admitted.  She is alert and oriented x3.  SKIN:  She does have a bandage to her right inguinal area with possibly one  stitch put in by Dr. Corliss Skains following angiogram.  This will likely need  to be removed in the next few days.  She has no signs of hematoma and pain  has improved associated with this.   LABORATORY WORK OBTAINED DURING HOSPITALIZATION AND AT TIME OF DISCHARGE:  Initial CBC:  White count of 5.6, hemoglobin of 12.6.  At time of discharge,  white count is 5.6, hemoglobin 13.1, platelet count of 297.  BMET obtained  during hospitalization shows sodium 138, potassium 3.7, chloride 106, CO2  26, glucose 94, BUN 19, creatinine 1.0, calcium of 8.9.  TSH was 0.625 and  free T4 was 1.17.  RPR was nonreactive.  Sed rate was 6.  Vitamin B12 level  was  472.  Homocysteine level was 5.  Her PTs have gone 1, 1.3, and 2.0  respectively.   IMAGES OBTAINED DURING HOSPITALIZATION:  CT of head showed acute left  occipital lobe infarction.  MRI/MRA of head revealed small focal ischemic  area in the left posterior frontal parasagittal region, old ischemic  changes, subacute area of probable ischemia in the right posterior frontal  anterior parietal region.  MRA question of whether some stenosis in the left  middle cerebral artery as well as a question of aneurysm.  She went for a  cerebral angiogram done by Dr. Corliss Skains which revealed no evidence  suggesting of presence of intracranial or extra-axial stenosis, dissection,  occlusions, or aneurysms.  No evidence of AV fistula or AVMs.  Venous  outflow was normal.  No signs of aneurysm.  She also had a right inguinal  ultrasound to rule out any signs of hematoma or significant lesion following  the angiogram that was causing her pain.  It only showed very small 6 mm  hematoma but no other abnormalities noted per Dr. Tyron Russell' report.   HOSPITAL COURSE PER PROBLEM:  #1 - CEREBROVASCULAR ACCIDENT.  Dr. Gerilyn Pilgrim  was consulted.  She has had multiple CVAs and TIA-type symptoms over the  last several months, has been worked up significantly with TEE, carotid  Dopplers, and has not been able to find any specific source related to it.  She did have angiogram which did not reveal any significant stenosis or  plaque which could be causing this issue.  She has remained on telemetry  throughout hospitalization and did not have any heart arrhythmias which  could predispose her to embolic-type disease.  She did have full workup and  felt that starting her on Coumadin to see if this would limit her embolic-  type strokes might be beneficial and we will do this for 6 months and  reevaluate if having any further issues related to it.   #2 - COUMADIN THERAPY.  Since she has had such rapid rise in her Coumadin level  will stop her Lovenox and will not need that at the nursing facility  and will start her out on 2.5 mg of Coumadin this afternoon starting at 6  p.m. and recheck a PT/INR in the morning and call results to Dr. Scharlene Gloss  office.  If we could run that STAT, that would be beneficial, and will  adjust her Coumadin level at that time.   #3 - HYPOTHYROIDISM.  Continue on her current regimen.  Checked level and is  doing well.   #4 - HYPERLIPIDEMIA.  Continue on the lovastatin.  This was checked in the  office recently and was doing well with that.   #5 - ANXIETY AND DEPRESSION.  She is doing well on Lexapro 5 mg daily.   #6 - RIGHT FOOT EVULSION FRACTURE.  She was seen and assessed by Dr.  Romeo Apple this morning in followup.  She has had this for approximately a  week to two weeks now.  He recommended continue her on the Ace wrap with the  soft cast for the next 3 weeks and then question whether she had any further  problems and follow up with him.   #7 - DEBILITY AND SOCIAL SITUATION.  She lives on a third-floor apartment  and given her recent stroke and her foot fracture, she is unable to be able  to live at her third story apartment.  She is currently working on getting  this resolved but I feel that she needs some daily physical therapy to help  increase her strength and mobility without having any weakness or falls, so  will send her to a nursing facility today.   #8 - HYPERTENSION AND BRADYCARDIA.  We will stop her Cardizem today and  continue her on the Toprol-XL.  She was asymptomatic with some of her  bradycardia but Cardizem is not a great blood pressure drug.  Please get  blood pressures daily on her and if her blood pressures run into the 160s  please call the office for further adjustment to her medicines.  Likely will  need an ARB in addition to her Toprol-XL as well.   DISPOSITION:  Will send the patient to [facility] for short-term  nursing care for the next 2-3  weeks, at which time she will be trying to  attempt to get another apartment on the first floor if possible.  Will need  physical therapy daily and will be getting routine PT/INR and Coumadin  checks until regulated.      Catalina Pizza, M.D.  Electronically Signed     ZH/MEDQ  D:  12/29/2005  T:  12/29/2005  Job:  [id number]
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
## 2010-10-11 NOTE — Letter (Signed)
July 29, 2006    Catalina Pizza, M.D.  [address]  [place],  [place] [address]   RE:  CAMARIA, GERALD  MRN:  [id number]  /  DOB:  May 11, 1936   Dear Ian Malkin:   Ms. Neglia returns to the office following an admission to [facility] in January for chest pain. Cardiac catheterization revealed no  critical coronary disease. She has done fairly well since discharge. She  notes tachycardia palpitations at times that resolve fairly promptly  with rest and with which there are no associated symptoms. She has had  some exertional dyspnea. She has had some chest discomfort that responds  to sublingual nitroglycerin. At times, she notes her whole body shaking  with her heartbeat.   CARDIAC MEDICATIONS:  1. Metoprolol 50 mg daily.  2. Furosemide 40 mg daily.  3. Lovastatin 40 mg daily.  4. Warfarin as managed from your office.   PHYSICAL EXAMINATION:  GENERAL:  A pleasant woman in no acute distress.  VITAL SIGNS:  The blood pressure is 130/80, heart rate 68 and regular,  respirations 16, weight 145, 7 pounds more than in April of last year.  HEENT:  Anicteric sclera.  LUNGS:  Clear.  CARDIAC:  Normal first and second heart sounds; fourth heart sound  present.  ABDOMEN:  Soft and nontender; no organomegaly.  EXTREMITIES:  No edema.  NEUROMUSCULAR:  Halting speech; no focal findings.   IMPRESSION:  Ms. Livengood is doing generally well. Should palpitations  become more troublesome, we can provide her with an event recorder.  Holter testing in the past has apparently not been revealing. Her chest  discomfort may represent mild ischemia and can continue to be treated  with sublingual nitroglycerin as needed. If it becomes more frequent, we  can try to increase her beta blocker or add a calcium channel  antagonist. Lipid profile was excellent a few months ago. We will  recheck a chemistry profile in 4 months and plan a return visit in 8  months. Vaccinations are up-to-date.    Sincerely,      Gerrit Friends. Dietrich Pates, MD, [facility]  Electronically Signed    RMR/MedQ  DD: 07/29/2006  DT: 07/29/2006  Job #: [id number]
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
## 2010-10-11 NOTE — Discharge Summary (Signed)
[name], [name]          ACCOUNT NO.:  [id number]   MEDICAL RECORD NO.:  [id number]          PATIENT TYPE:  INP   LOCATION:  3713                         FACILITY:  [facility]   PHYSICIAN:  Thomas C. Wall, MD, FACCDATE OF BIRTH:  [birth year]   DATE OF ADMISSION:  06/22/2006  DATE OF DISCHARGE:  06/25/2006                               DISCHARGE SUMMARY   PROCEDURES PERFORMED DURING HOSPITALIZATION:  1. Cardiac catheterization performed by Dr. Tonny Bollman on June 25, 2006.      a.     Nonobstructive coronary artery disease.      b.     Normal left ventricular function.      c.     Medical treatment with normal left ventricular ejection       fraction of 65% with no mitral regurgitation.   PRIMARY DIAGNOSES:  1. Chest pain, noncardiac.  2. Hypertension.   SECONDARY DIAGNOSES:  1. Hyperlipidemia.  2. History of cerebrovascular accident x2.  3. Hypothyroidism.  4. Asthma.  5. Arthritis.  6. Iron deficiency anemia.  7. Chronic Coumadin anticoagulation secondary to cerebrovascular      accident.  8. Temporomandibular joint surgery.  9. Cataracts.  10.Abnormal PVD in June 2007 on isoniazid for a 22-month course.   HISTORY OF PRESENT ILLNESS:  A 75 year old Caucasian female with no  prior history of coronary artery disease in her usual state of health,  while shopping at [employer], lifted a case of water into her cart and  continued shop.  While waiting at checkout, she developed 10/10  substernal chest pressure feeling like a big lump inside without  associated symptoms.  The patient went by her primary care physician's  office secondary to the pain and continuing of symptoms with radiation  pain down the left arm with sharp pain the left jaw.  The primary care  physician called EMS and, on arrival, she was treated with sublingual  nitroglycerin by EMS with complete resolution of pain.  They called Dr.  [place] Bing, her cardiologist, and she was advised to come  to [facility] ED.  The patient was seen and examined by Ward Givens,  Nurse Practitioner, and by Dr. Valera Castle in the [facility] Emergency  Room.   HOSPITAL COURSE:  On arrival, the patient's vital signs were found to be  100/77, heart rate 63, respirations 16, temperature 97.8, O2 100% on 2  liters.  The patient was admitted to rule out myocardial infarction.  Her Coumadin was discontinued and subsequent lab work was completed.  PT, on admission, was found to be 30.7, INR 2.8 with a PTT of 43.  The  patient was given vitamin K 2.5 mg and cardiac catheterization was  planned the following day.   Cardiac catheterization was completed by Dr. Tonny Bollman on June 25, 2006.  The left main was angiographically normal.  It bifurcated  into the LAD and left circumflex.  The left main stem is short.  The LAD  is a medium-caliber vessel in the proximal portion giving rise to a  large first diagonal branch that  was actually bigger in the mid and  distal true LAD.  The LAD, in its mid portion, had a long, tubular 40%  stenosis.  The vessel beyond that is a small diameter.  The first  diagonal branch, which again is a large vessel, almost services a twin  LAD system.  It is angiographically without significant disease.  The  left circumflex has nonobstructive plaque in the ostium that appears no  worse than 20% to 25%.  The circumflex gives off a first obtuse diagonal  branch, which is tortuous, but has no significant angiographic disease.  The remainder of the circumflex is small.  The right coronary artery was  found to be dominant.  It is angiographically normal throughout its  proximal and mid portions.  The distal vessel had nonobstructive plaque.  It gives off to a large posterior descending branch, which is  angiographically normal.  It gives off a medium-sized posterolateral  branch.  Left ventricular function was assessed in a 30 degree right  anterior oblique projection  demonstrating normal left ventricular  function with a left ventricular ejection fraction of 65%.  There was no  mitral regurgitation.  Per Dr. Tonny Bollman.   The patient recovered well after cardiac catheterization with no  continued complaints of chest discomfort.  The patient was started on  Protonix 40 mg p.o. daily during hospitalization.  She had no evidence  of bleeding, hematoma, or pseudoaneurysm post catheterization.  She was  started back on her Coumadin per pharmacy and will continue Coumadin as  an outpatient with follow up in the [facility] Clinic.   DISCHARGE LABORATORY DATA:  Hemoglobin 12.7, hematocrit 39, white blood  cells 6.1, platelets 273,000.  PT 15.3, INR 1.2.  Sodium 142, potassium  3.5, chloride 103, CO2 32, glucose 114, BUN 17, creatinine 0.76.  Troponin 0.01 and 0.02 respectively.  Cholesterol 135, triglycerides  158, HDL 53, LDL 50.  TSH 2.646.   VITAL SIGNS ON DISCHARGE:  Temperature 97.8, pulse 64, respirations 20,  blood pressure 115/69, O2 sat 100% on room air.  The patient weight  142.3 pounds on discharge.   FOLLOWUP APPOINTMENTS AND PLAN:  1. The patient will be seen by Dr. [place] Bing in the [facility] on July 15, 2006 at 10:00 a.m.  2. The patient has been given post cardiac catheterization instruction      with particular emphasis on the right groin site to evaluate for      bleeding, infection, or swelling.  3. The patient has been advised of a new medication, Protonix 40 mg      once a day, and a prescription has been given.  4. The patient is told to continue her other medications at home as      directed.   DISCHARGE MEDICATIONS:  1. Protonix 40 mg once a day (new prescription).  2. Toprol XL 50 mg once a day.  3. Synthroid 50 mcg once a day.  4. Lasix 40 mg daily.  5. Lovastatin 40 mg daily.  6. Potassium chloride 20 mEq daily.  7. Coumadin as directed.  8. Isoniazid 3 mg daily. 9. Multiple multivitamins to  include calcium, vitamin C, vitamin E,      biotin, flax oil, magnesium, B-complex, omega, and selenium.   The patient is being helped by case management for a ride home as she  did not arrive here on her own effort and was brought here by EMS.  This  will  be completed prior to discharge.      Bettey Mare. Lyman Bishop, NP      Jesse Sans. Daleen Squibb, MD, [facility]  Electronically Signed    KML/MEDQ  D:  06/25/2006  T:  06/26/2006  Job:  [id number]   cc:   Catalina Pizza, M.D.

## 2010-10-11 NOTE — Group Therapy Note (Signed)
NAMEFELIZ, HERARD          ACCOUNT NO.:  [id number]   MEDICAL RECORD NO.:  [id number]          PATIENT TYPE:  INP   LOCATION:  A213                          FACILITY:  [facility]   PHYSICIAN:  Catalina Pizza, M.D.        DATE OF BIRTH:  04-01-1936   DATE OF PROCEDURE:  DATE OF DISCHARGE:                                   PROGRESS NOTE   SUBJECTIVE:  [name] is a 75 year old white female admitted  for bihemispheric stroke. Has had a cerebral angiogram and with no  significant findings, very distal lesions, not amenable to any type of  surgery, unclear exactly where these strokes are originating from but per  Dr. Ronal Fear thoughts and  recommendations, started her on Coumadin for at  least six months.  Biggest complaint she has now is right leg and right  lower quadrant abdominal pain occasional and pain medicine does help with  this and this pain comes and goes.  This is the side that she had the  angiogram catheter put in.  No other complaints per her report.   OBJECTIVE:  VITAL SIGNS:  Temperature 98.2, blood pressure 109/63, pulse 48,  respirations 20, and saturation 97-100% on room air.  GENERAL:  This is an elderly white female, looking younger than her stated  age, in no acute distress, lying in bed.  HEENT:  Unchanged.  HEART:  Regular rate and rhythm.  No murmurs, gallops or rubs.  ABDOMEN:  Soft, nondistended with positive bowel sounds.  Does have some  right lower quadrant pain almost towards the inguinal area.  LUNGS:  Clear to auscultation bilaterally.  EXTREMITIES:  No cyanosis, clubbing or edema.  Positive pulses in all  extremities.  Right inguinal femoral area does not have any ecchymoses.  No  significant mass appreciated  NEUROLOGIC: Continues to have decreased sensation in the right foot with  numbness periodic which is somewhat better than constant before.  No other  specific deficits noted that are not chronic for her.   LABORATORY DATA:  PT/INR  showed a protime of 16.3, INR 1.3.   IMPRESSION:  This is a 75 year old white female with new onset of  hemispheric infarction cerebrovascular accident.  I started her on Coumadin  at this time.   ASSESSMENT/PLAN:  1.  Right abdominal inguinal pain, shooting in nature.  Wonder if related to      irritation of the nerve in the right inguinal bundle from the      catheterization, but given her degree of occasional pain, I need to      confirm that she is not having a bleed given the fact that she started      on Coumadin.  No signs of external bleed or external hematoma at this      time.  Pain may improve as she gets further out from the angiogram. We      will get an ultrasound with Dopplers on this.  2.  Hypothyroidism.  Continue with her current dosage of thyroid medicine.  3.  Anxiety and depression.  Doing well.  Continue on Lexapro.  4.  Hyperlipidemia.  Continue on lovastatin  5.  Right foot fracture.  Awaiting input from Dr. Romeo Apple.   DISPOSITION:  The patient maybe able to go to a nursing facility tomorrow if  all other medical conditions are stable and we will continue to get her  therapeutic on her Coumadin.      Catalina Pizza, M.D.  Electronically Signed     ZH/MEDQ  D:  12/28/2005  T:  12/28/2005  Job:  [id number]

## 2010-10-11 NOTE — H&P (Signed)
[name], [name]          ACCOUNT NO.:  [id number]   MEDICAL RECORD NO.:  [id number]          PATIENT TYPE:  INP   LOCATION:  2630                         FACILITY:  [facility]   PHYSICIAN:  Cynthia Sans. Wall, MD, FACCDATE OF BIRTH:  December 31, 1935   DATE OF ADMISSION:  06/22/2006  DATE OF DISCHARGE:                              HISTORY & PHYSICAL   PRIMARY CARDIOLOGIST:  Cynthia Friends. Dietrich Pates, MD, [facility] in [place].   PRIMARY CARE PHYSICIAN:  [name], M.D. in [place].   PATIENT PROFILE:  A 75 year old Caucasian female with no prior history  of CAD who presents to [place] ED with chest pain.   PROBLEM LIST:  1. Chest pain.      a.     July 2005 - low-risk Myoview.      b.     June 02, 2005 - 2-D echocardiogram with EF 60-65%, normal       [name] motion.  2. Hypertension.  3. Hyperlipidemia.  4. History of CVA x2.  5. Hypothyroidism.  6. Asthma.  7. Arthritis.  8. Iron-deficiency anemia.  9. Chronic Coumadin anticoagulation secondary to CVA.  10.TMJ surgery.  11.Cataract.  12.Status post total hysterectomy.  13.History of some joint replacement.  14.Benign left frontal meningioma.  15.Rectocele.  16.Abnormal PPD in June of 2007, on isoniazid for a 45-month course.   HISTORY OF PRESENT ILLNESS:  A 75 year old Caucasian female with no  prior history of CAD.  She was in her usual state of health until today  when, while at [facility], she had lifted a case of water into her cart  and continued shopping without problems.  While waiting to check out,  she developed 10/10 substernal chest pressure, saying it felt like a  big lump inside without associated symptoms.  She paid for her food and  then got into her car, and while driving to her primary care [name]'s  office, symptoms radiating down the left arm with sharp pain to the left  jaw.  Her primary care doctor called EMS, and following their arrival  she was treated with one sublingual nitroglycerin with complete  resolution of pain.  After discussion with Dr. Dietrich [name], she was taken  to [facility] ED.  Currently, she is comfortable but complains of a  minimal 1/10 left chest pain.   ALLERGIES:  1. DEMEROL.  2. VICODIN.  3. EPINEPHRINE.   MEDICATIONS:  1. Synthroid 50 mcg daily.  2. Toprol XL 50 mg daily.  3. Lasix 40 mg daily.  4. Lovastatin 40 mg daily.  5. Potassium chloride 20 mEq daily.  6. Coumadin as directed.  7. Isoniazid 3 mg daily since June of 2007.   FAMILY HISTORY:  Mother died at age 42 with a history of hypertension,  cancer and CVA.  Father died at 85 with CVA, cancer and heart problems.  She has two brothers who are alive and well.   SOCIAL HISTORY:  She lives in [place] with her cat.  She has one son  who lives in [place] and has a history of asthma.  She  currently volunteers at [employer].  She says maybe she smoked  for about a year in her 97s, but has not smoked since then.  She will  have a glass of wine every once in a while.  Denies any drug use.  She  will occasionally exercise.   REVIEW OF SYSTEMS:  Positive for chest pain as outlined in the HPI.  She  does have some residual right-sided weakness which she characterizes as  mild since her strokes.  Otherwise, all systems reviewed and negative.   PHYSICAL EXAMINATION:  VITAL SIGNS:  Temperature 97.8, heart rate 63,  respirations 16, blood pressure 100/77, pulse oximetry 100% on 2 liters.  GENERAL:  A pleasant white female in no acute distress.  Awake, alert  and oriented x3.  NECK:  Normal carotid upstrokes.  No bruits or JVD.  LUNGS:  Respirations were unlabored.  Clear to auscultation.  CARDIAC:  Regular S1 and S2.  No S3, S4 or murmurs.  ABDOMEN:  Round, soft, nontender, nontender.  Bowel sounds present x4.  EXTREMITIES:  Warm dry, pink.  No clubbing, cyanosis or edema.  Dorsalis  pedis pulse and posterior tibial pulses 2+ and equal bilaterally.  There  are no femoral bruits.    CHIEF COMPLAINT:  Pending.   ELECTROCARDIOGRAM:  Sinus bradycardia with a normal axis and a rate of  57 beats per minute.  No acute ST-T changes.   LABORATORY WORK:  Pending.   ASSESSMENT AND PLAN:  1. Chest pain.  She has minimal chest discomfort now.  Chest pain      earlier today lasted about 1 hour and was relieved with      nitroglycerin.  The patient says up front that she prefers to avoid      an invasive evaluation if possible.  Plan to admit and cycle      cardiac enzymes.  Continue beta blocker, statin, baby aspirin, and      I will hold her Coumadin for now.  If cardiac enzymes are abnormal,      will plan catheterization in the a.m.  However, if enzymes are      negative, we will likely do an inpatient Myoview.  2. Hypertension, stable on Lasix and beta blocker.  3. Hyperlipidemia.  Check lipids and LFTs.  Continue statin.  4. Hypothyroidism.  Check PFTs.  Continue Synthroid.  5. History of cerebrovascular accident.  Coumadin on hold for now      secondary to #1.  Continue statin.  6. History of abnormal PPD.  She is on isoniazid therapy for 9 months      to complete in March of 2008.      [name], ANP      Cynthia Sans. Daleen Squibb, MD, [facility]  Electronically Signed    CB/MEDQ  D:  06/22/2006  T:  06/22/2006  Job:  [id number]

## 2010-10-11 NOTE — Discharge Summary (Signed)
[name], [name]          ACCOUNT NO.:  [id number]   MEDICAL RECORD NO.:  [id number]          PATIENT TYPE:  INP   LOCATION:  A228                          FACILITY:  [facility]   PHYSICIAN:  Osvaldo Shipper, MD     DATE OF BIRTH:  25-Feb-1936   DATE OF ADMISSION:  06/02/2005  DATE OF DISCHARGE:  01/09/2007LH                                 DISCHARGE SUMMARY   PRIMARY CARE PHYSICIAN:  Dr. Loleta Chance.  However, she is thinking of  getting a  new PMD at this time.   DISCHARGE DIAGNOSES:  1.  Acute left parietal infarct.  2.  Hypertension.  3.  Dyslipidemia.  4.  Hypothyroidism.   HISTORY AND PHYSICAL:  Please see the H&P dictated at the time of admission  detailed regarding the patient's presenting illness.   BRIEF HOSPITAL COURSE:  The patient is a 75 year old white female who  presented to the hospital with the sudden onset of confusion and mild right-  sided weakness.  The patient initially underwent a CAT scan of her head,  which did not show any acute findings.  There were some findings of stable  left frontal meningioma and old infarction of the right frontal lobe.  Because her presentation was typical for a TIA the patient was admitted for  further workup.  She underwent an MRI of her brain the following day, which  showed acute left posterior parietal and posterior frontal temporal lobe  infarction.  Subsequently Dr. Gerilyn Pilgrim was consulted. Patient was started on  Aggrenox.  A carotid Doppler study was done, which did not show any  significant stenosis.  An echocardiogram was also done on this patient,  which showed an EF of 60-65%.  No other significant abnormalities were  noted.   The patient was also seen by physical therapy, who said that the patient had  a mild deficit and that should could benefit from outpatient PT, which will  be set up for this patient.   The patient is also status post surgery to her right side of her right eye  for various reasons including  trauma when she was 75 years old.  As a result  she has some diplopia in the right eye, for which she has been following up  with the ophthalmologist in the past one year.  She has been told that she  needs to see a neuro-ophthalmologist, which she has not had a chance to do  so until now.   Her other medical issues include hypertension, hypothyroidism, are all  stable.   DISCHARGE MEDICATIONS:  1.  Aggrenox one capsule daily for two days, then one capsule b.i.d.  2.  Enteric coated aspirin 81 mg once daily.  3.  Vicodin 7.5/500 q.6h. p.r.n. for pain.  4.  Lovastatin 40 mg p.o. once daily.  5.  She may resume her other outpatient medications as before except the      aspirin which has been changed.  Please see H&P.   FOLLOW UP:  1.  Dr. Gerilyn Pilgrim in three weeks.  2.  She is requesting a new PMD, Dr. Early Chars, in one week's time.  DISCHARGE INSTRUCTIONS:  LFTs are to be checked in one week's time with the  results to her PMD.   DIET:  Heart healthy diet.   PHYSICAL ACTIVITY:  As before.  Outpatient PT is to be set up.   STUDIES:  Done during this hospital stay include the following:  1.  CT of the head.  2.  Chest x-ray that did not show any acute abnormality.  3.  Carotid Dopplers discussed above.  4.  MRI of the brain and an MRI of the orbit, face, and neck also discussed      above.  The MRI of the orbit did not show any significant findings as      well.  5.  A 2-D echocardiogram also done as discussed above.   CONSULTATIONS:  Dr. Gerilyn Pilgrim.   ADDENDUM:  Please note that the above is preliminary until signed.      Osvaldo Shipper, MD  Electronically Signed     GK/MEDQ  D:  06/03/2005  T:  06/03/2005  Job:  [id number]   cc:   Annia Friendly. Loleta Chance, MD  Fax: [phone]   Dorthula Rue. Early Chars, MD  Fax: [phone]   Kofi A. Gerilyn Pilgrim, M.D.  Fax: [phone]

## 2010-10-11 NOTE — Procedures (Signed)
NAMEEDWARDINE, DESCHEPPER          ACCOUNT NO.:  [id number]   MEDICAL RECORD NO.:  [id number]          PATIENT TYPE:  AMB   LOCATION:  DAY                           FACILITY:  [facility]   PHYSICIAN:  Vida Roller, M.D.   DATE OF BIRTH:  1935/05/29   DATE OF PROCEDURE:  08/20/2005  DATE OF DISCHARGE:                                  ECHOCARDIOGRAM   PRIMARY CARE PHYSICIAN:  Catalina Pizza, M.D.   INDICATIONS:  Ms. Lesesne is a lady that I follow who has anxiety,  depression, hypertension and a history of an acute CVA.  She has had a  history of TIA in the past.  Her CVA, unfortunately, was in a multivessel  territory and so Dr. Gerilyn Pilgrim had asked if we would consider doing a  transesophageal echocardiogram.  Her transthoracic echocardiogram was  essentially unremarkable as was her bilateral carotid Dopplers.  The thought  was that there was potential for an inner cardiac shunt causing  multiterritorial vessel distribution to her stroke and we proceed with  transesophageal echocardiogram after obtaining informed consent.   DESCRIPTION OF PROCEDURE:  The patient was placed in the left lateral  decubitus position after being n.p.o. after midnight.  She was given  conscious sedation and her oropharynx was anesthetized using topical  Cetacaine spray.  Once the patient achieved adequate conscious sedation  using graduated doses of Versed and Fentanyl, the transesophageal  echocardiogram probe was successfully passed through the oropharynx into the  stomach where transgastric views were obtained.  Once the transgastric views  were obtained, the patient then had the transesophageal echocardiogram probe  moved to the mid esophageal point of view.  At that point, imaging was  obtained.  The patient then had agitated saline used as a contrast agent and  after this was completed, the probe was removed and the patient was  recovered appropriate for an upper endoscopy.  She recovered without any  complications.   FINDINGS:  1.  The left ventricle is normal size with normal systolic function.      Estimated ejection fraction is 60-65%.  There are no wall motion      abnormalities.  The ventricular septum is intact to both color and      contrast.  2.  The right ventricle is normal size.  There is normal systolic function.  3.  Both atria appear to be top normal in size.  The left atrium has a      pulsatile left atrial appendage with no evidence of clot.  4.  Superior vena cava and the inferior vena cava appear to be appropriately      positioned anatomically.  5.  The aortic valve was trileaflet, tricommisural, slightly sclerotic with      no evidence of stenosis or regurgitation.  There was no vegetation seen.  6.  The mitral valve is delicate with no evidence of vegetation.  There is      mild to mitral regurgitation.  There is mild annular calcification.      There is no evidence of stenosis.  7.  The tricuspid valve is delicate.  There  is mild regurgitation.  No      stenosis is seen.  8.  The pulmonic valve has mild regurgitation.  There is a delicate valve      with no stenosis.  9.  The atrial septum is intact to color and also intact to agitated saline      contrast.  10. The descending aorta, aortic arch and ascending aorta are free of      significant atheroma and there is a small area of sessile fixed atheroma      in the descending aorta which is appropriate for the patient's age.   ASSESSMENT:  There does not appear to be any obvious inner cardiac shunt or  source of emboli in the cardiac strictures.      Vida Roller, M.D.  Electronically Signed     JH/MEDQ  D:  08/20/2005  T:  08/21/2005  Job:  [id number]   cc:   Darleen Crocker A. Gerilyn Pilgrim, M.D.  Fax: [phone]

## 2010-10-11 NOTE — Group Therapy Note (Signed)
NAME:  [name], [name]          ACCOUNT NO.:  [id number]   MEDICAL RECORD NO.:  [id number]          PATIENT TYPE:  INP   LOCATION:  A213                          FACILITY:  [facility]   PHYSICIAN:  Catalina Pizza, M.D.        DATE OF BIRTH:  [birth year]   DATE OF PROCEDURE:  DATE OF DISCHARGE:                                   PROGRESS NOTE   SUBJECTIVE:  [name] is a 75 year old white female admitted for  another bihemispheric infarction.  Initially MRA is read as possible  aneurysm but had angiogram today by Dr. Corliss Skains but per his preliminary  report, angiographically no evidence of stenosis, occlusions, or aneurysms  seen.  Venous outflow within normal limits.  He entered through the right  groin.  She tolerated the procedure well.  At this time, only complaint is  lower back pain due to lying flat.  She is not having any other complaints  at this time.  She did have some wheezing and was started on Xopenex neb.   OBJECTIVE:  VITAL SIGNS:  Temperature 98.1, blood pressure 122/68, pulse 59,  respirations 18, 99% on room air.  GENERAL:  This is an elderly white female younger than stated age.  No acute  distress.  Lying in bed.  HEENT:  Unchanged.  HEART:  Regular rate and rhythm.  No murmurs, rubs or gallops.  ABDOMEN:  Soft, nontender, nondistended.  Positive bowel sounds.  LUNGS:  Clear to auscultation bilaterally.  EXTREMITIES:  No clubbing, cyanosis or edema.  Positive pulses in all  extremities.  She did get a stitch and dressing over the right inguinal  area.  Dry.  No signs of bleeding.  NEUROLOGIC:  Continues to have decreased sensation in her right foot and mid  calf but no change in weakness.   LABORATORY DATA:  RPR was nonreactive.  Sed rate was 6.  Vitamin B12 level  was 472.  Homocysteine level was 5.   ASSESSMENT/PLAN:  1.  Cerebrovascular accident:  Was seen by Dr. Gerilyn Pilgrim, who stated to start      her on Coumadin following the angiogram.  Once everything has  come back      negative, we will start her on Lovenox 1 mg/kg subcu b.i.d. and DC her      Aggrenox and begin her on Coumadin dose per pharmacy.  Continue with      physical therapy for now.  2.  Hypothyroidism:  Doing well.  Continue current medications.  3.  Anxiety/depression:  Doing well.  Continue Lexapro.  4.  Hyperlipidemia:  Continue Lovastatin.  5.  Right foot fracture:  See Dr. Romeo Apple next week.  Will try to get him      to come by and assess her prior to discharge to nursing facility.   DISPOSITION:  The patient is accepted at the [facility] and will  likely be sent over to the [facility] on Monday for further  rehabilitation and physical therapy, more than what she would get at home,  as well as trying to work out her living situation, since she  lives in a  third floor apartment, should try to get this changed.      Catalina Pizza, M.D.  Electronically Signed     ZH/MEDQ  D:  12/26/2005  T:  12/26/2005  Job:  [id number]

## 2010-10-11 NOTE — Group Therapy Note (Signed)
[name], Cynthia          ACCOUNT NO.:  [id number]   MEDICAL RECORD NO.:  [id number]          PATIENT TYPE:  INP   LOCATION:  A213                          FACILITY:  [facility]   PHYSICIAN:  Catalina Pizza, M.D.        DATE OF BIRTH:  06/23/1935   DATE OF PROCEDURE:  12/27/2005  DATE OF DISCHARGE:                                   PROGRESS NOTE   SUBJECTIVE:  [name] is a 75 year old white female who was admitted  with another bi-hemispheric infarction.  She was found to have small vessel  changes on cerebral angiogram yesterday by Dr. Corliss Skains.  No specific  findings to elaborate on cause of this stroke.  She states she did have  lower back pain which was relieved by pain medicine yesterday following the  angiogram.  She denies any problems with shortness of breath or chest pains  at this time.  She does mention some constipation.   OBJECTIVE:  VITALS:  Temperature 97.8.  Blood pressure 143/72.  Pulse 53.  Respirations 18.  Oxygen saturation 99% on room air.  GENERAL:  This is an elderly white female younger than stated age and in no  acute distress.  HEENT:  Unchanged.  HEART:  Regular rate and rhythm.  No murmurs, gallops, or rubs.  No signs of  arrhythmias on telemetry.  ABDOMEN:  Soft, nontender, nondistended.  Positive bowel wounds.  LUNGS:  Clear to auscultation bilaterally.  EXTREMITIES:  No cyanosis, clubbing, or edema.  Positive pulse in all  extremities.  NEUROLOGIC:  No significant change.  Continues with decreased sensation in  the right foot and mid calf but no change in weakness from yesterday.   LABORATORY DATA:  CBC shows white count of 5.8.  Hemoglobin 12.4.  Platelet  count 298.  PT 13.5, INR 1.0.   ASSESSMENT AND PLAN:  1.  Cerebrovascular accident, seen and assessed by Dr Jerre Simon.  Had the      cerebral angiogram by Dr. Corliss Skains.  Started on Coumadin yesterday and      will continue to monitor her PT INR to get therapeutic between 2-3      range.   Likely will need this for six months and then will re-evaluate.      Still unclear the exact cause of this.  2.  Hypothyroidism, doing well.  3.  Anxiety and depression, doing well.  4.  Hyperlipidemia.  Continue with lovastatin.  5.  Right foot fracture, getting Dr. Romeo Apple to assess her and prior to      going to the nursing facility.   DISPOSITION:  The patient will be going to a short-term nursing facility for  further physical therapy while she has time to work out her living situation  given the fact she lives in a third-floor apartment.      Catalina Pizza, M.D.  Electronically Signed     ZH/MEDQ  D:  12/27/2005  T:  12/27/2005  Job:  [id number]

## 2010-10-11 NOTE — Op Note (Signed)
NAME:  [name], MERRIMAN                    ACCOUNT NO.:  [id number]   MEDICAL RECORD NO.:  [id number]                   PATIENT TYPE:  OIB   LOCATION:  2899                                 FACILITY:  [facility]   PHYSICIAN:  Alford Highland. Rankin, M.D.                DATE OF BIRTH:  08/05/1935   DATE OF PROCEDURE:  02/05/2004  DATE OF DISCHARGE:                                 OPERATIVE REPORT   PREOPERATIVE DIAGNOSIS:  Epiretinal membrane, right eye.   POSTOPERATIVE DIAGNOSES:  1.  Epiretinal membrane, right eye.  2.  Posterior capsular opacity, right eye.   PROCEDURE:  1.  Posterior vitrectomy with membrane peel, right eye, using #25 gauge      system.  2.  Posterior capsulotomy, right eye.   SURGEON:  Alford Highland. Rankin, M.D.   ANESTHESIA:  Local retrobulbar, mild sedation.   INDICATIONS FOR PROCEDURE:  The patient is a 75 year old woman who has  significant visual impairment of the right eye, on the basis of macular  topographic distortion from an epiretinal membrane.  This is an attempt to  remove the macular wrinkling.  The patient understands the risks of  anesthesia, including the rare occurrence of death, but also to the eye,  including but not limited to hemorrhage, infection, scarring, need for  another surgery, no change in vision, loss of vision and a progression of  the disease despite intervention.   DESCRIPTION OF PROCEDURE:  After an appropriate signed consent was obtained,  she was taken to the operating room.  In the operating room the appropriate  monitoring, followed by mild sedation, 0.75% Marcaine delivered 5 mL  retrobulbar, an additional 5 mL laterally, in the fashion of a modified [employer].  The right periocular region was sterilely prepped and draped in the  usual ophthalmic fashion.  A lid speculum was applied.  A #25 gauge system  was then placed 3.5 mm posterior to the limbus in the inferotemporal  quadrant with the infusion cannula verified in the  vitreous cavity.  The  superior entry sites were then made with the #25 gauge cannula system.  The  microscope placed into position with the Biom attachment.  A core vitrectomy  was then begun.  It was notable that a previous vitreous detachment had  occurred.  The peripheral vitreous skirt trimmed 360 degrees.  No retinal  holes or tears were identified.  The epiretinal membrane was engaged but not  the membrane.  __________ was removed in a continuous sheet of the entire  posterior pole, apart from the temporal arcades.  A limited __________ with  a #25 gauge needle was engaged.  Identified that there were remnants of  internal limiting membrane.  None could be identified.  It was therefore  believed that the internal limiting membrane had been removed in one  continuous sheet from the macular region.  It was noted during this time of  the membrane  peel, that remnants of the posterior capsule opacity with  limited visualization of the peripheral retina.  The surgical posterior  capsule was then completed and enlarged.  The peripheral retina was  inspected and found to be free of retinal holes or tears.  The instruments  were removed from the eye.  Plugs placed into the #25 gauge cannula.  The  cannula was removed and gentle compression held over this area.  The  intraocular was assessed after the infusion had been removed and found  to be normal.  The subconjunctival injection of Decadron placed inferiorly.  A sterile patch and Fox shield were applied.  The patient was awakened without problem, and was taken to the recovery  room.  No complications occurred.                                               Alford Highland Rankin, M.D.    GAR/MEDQ  D:  02/05/2004  T:  02/05/2004  Job:  [id number]

## 2010-10-11 NOTE — Group Therapy Note (Signed)
NAME:  [name], SWISS          ACCOUNT NO.:  [id number]   MEDICAL RECORD NO.:  [id number]          PATIENT TYPE:  INP   LOCATION:  A213                          FACILITY:  [facility]   PHYSICIAN:  Catalina Pizza, M.D.        DATE OF BIRTH:  Dec 08, 1935   DATE OF PROCEDURE:  12/25/2005  DATE OF DISCHARGE:                                   PROGRESS NOTE   SUBJECTIVE:  [name] is a 75 year old white female admitted for  another stroke, bi-hemispheric infarction.  She was also found to have a  question of aneurysm on MRA upon the left side of the internal carotid  artery.  She does state she does continue to have some decreased sensation  in her toes on her right foot and question of some generalized weakness, but  states it is about the same.  She did have a little episode today where she  felt a little bit weaker, but once she sat down felt much better.  She  denies any other problems with shortness of breath or chest pains.   OBJECTIVE:  VITAL SIGNS:  Temperature is 98.3, blood pressure is 135/74,  pulse is 55, respirations are 20.  GENERAL:  This is an elderly white female, younger than her stated age, in  no acute distress.  HEENT:  Unchanged.  HEART:  Regular rate and rhythm.  No murmurs, gallops or rubs.  ABDOMEN:  Soft, nontender, nondistended, with positive bowel sounds.  LUNGS:  Clear to auscultation bilaterally.  EXTREMITIES:  No cyanosis, clubbing or edema.  Positive pulses in all  extremities.  NEUROLOGIC:  Continues to have decreased sensation in the right foot and mid  calf, but no change in weakness from yesterday.   LABORATORY DATA:  Sed rate 6; they are still pending some homocysteine level  and RPR.   ASSESSMENT/PLAN:  1.  Cerebrovascular accident.  Was seen and assessed by Dr. Gerilyn Pilgrim, and      suggest continuing on her current therapy for now and will assess with      angiogram the MCA stenosis, as well as the possible aneurysm.  Likely      may suggest to  anticoagulated for 6 months with Coumadin once felt the      aneurysm is either taken care of or not significant.  There have been no      signs of paroxysmal atrial fibrillation since being on the floor.  2.  Question carotid aneurysm.  Awaiting angiogram; to be sent to [facility]      for this.  Unclear whether neurosurgery or Dr. Corliss Skains of      neuroradiology will be able to fix this lesion, or at least assess      whether this lesion is stable so that she could go on anticoagulation      therapy.  3.  Hypothyroidism.  Doing well.  Continued with her current dosage/  4.  Anxiety/depression.  Doing well.  Continue on the Lexapro.  5.  Hyperlipidemia.  Continue with the lovastatin.  6.  Right foot fracture.  She is to followup with Dr. Romeo Apple  next week,      but, if is going to a nursing facility, may try to get him in to assess      earlier.   DISPOSITION:  The patient concerned about her living situation and working  with a care manager/social worker to help assess this and work out possible  short stay in a nursing facility for rehabilitation, and then try to get her  moved to a different apartment that is not on the third floor, given her  medical condition.      Catalina Pizza, M.D.  Electronically Signed     ZH/MEDQ  D:  12/25/2005  T:  12/25/2005  Job:  [id number]
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
## 2010-10-11 NOTE — Procedures (Signed)
NAME:  [name], [name]                    ACCOUNT NO.:  [id number]   MEDICAL RECORD NO.:  [id number]                   PATIENT TYPE:  INP   LOCATION:  A226                                 FACILITY:  [facility]   PHYSICIAN:  [place] Bing, M.D.               DATE OF BIRTH:  10/18/1935   DATE OF PROCEDURE:  11/30/2003  DATE OF DISCHARGE:                                  ECHOCARDIOGRAM   REFERRING PHYSICIAN:  Jerolyn Shin C. Katrinka Blazing, M.D.   CLINICAL DATA:  A 75 year old woman with chest pain, hypertension, and  asthma.   M-MODE TRACINGS:  1. Aorta 2.8.  2. Left atrium 3.6.  3. Septum 1.4.  4. Posterior wall 1.0.  5. LV diastole 3.7.  6. LV systole 1.9.   IMPRESSION:  1. Technically adequate echocardiographic study.  2. Left atrial size at the upper limit of normal; normal right atrium.  3. Normal right ventricular size and function; borderline RVH.  4. Normal aortic valve; mild annular calcification.  5. Normal mitral valve; mitral annular calcification.  6. Normal pulmonic valve.  7. Normal tricuspid valve.  8. Normal internal dimension of the left ventricle; mild hypertrophy; normal     regional and global LV systolic function.  9. Normal IVC.      ___________________________________________                                            [place] Bing, M.D.   RR/MEDQ  D:  11/30/2003  T:  12/01/2003  Job:  [id number]

## 2010-10-11 NOTE — Cardiovascular Report (Signed)
[name], [name]          ACCOUNT NO.:  [id number]   MEDICAL RECORD NO.:  [id number]          PATIENT TYPE:  INP   LOCATION:  3713                         FACILITY:  [facility]   PHYSICIAN:  Veverly Fells. Excell Seltzer, MD  DATE OF BIRTH:  Nov 16, 1935   DATE OF PROCEDURE:  DATE OF DISCHARGE:                            CARDIAC CATHETERIZATION   PROCEDURE:  Left heart catheterization, selective coronary angiography,  left ventricular angiography and Star close of the right femoral artery.   INDICATIONS:  [name] is a 75 year old woman who presented with  chest pain.  She ruled out for a myocardial infarction.  She had very  typical symptoms and has multiple risk factors, so she was subsequently  referred for cardiac catheterization.   PROCEDURAL DETAILS:  Risks and indications of the procedure were  explained in detail to the patient.  Informed consent was obtained.  The  right groin was prepped and draped and anesthetized with 1% lidocaine.  Using modified Seldinger technique a 6-French sheath was placed in the  right femoral artery.  Multiple views of the left and right coronary  arteries were taken using standard 6-French diagnostic catheters.  A  pigtail catheter was then inserted into the left ventricle where  pressures were recorded.  A left ventriculogram was performed.  A  pullback across the aortic valve was done.  All catheter exchanges were  performed over a guidewire.  At the conclusion of the case, a Star close  device was used to seal the right femoral arteriotomy.  There were no  immediate complications.   FINDINGS:  Aortic pressure 149/73, mean of 106, left ventricular  pressure 155/11 with an end-diastolic pressure of 16.   CORONARY ANGIOGRAPHY:  Left mainstem is angiographically normal.  It  bifurcates into the LAD and left circumflex.  The left main stem is  short.   The LAD is a medium caliber vessel in its proximal portion.  It gives  off a large first  diagonal branch that is actually bigger than the mid  and distal true LAD.  The LAD in its midportion has a long tubular 40%  stenosis.  The vessel beyond this is small diameter.  The first diagonal  branch, which again is a large vessel and almost serves as a twin LAD  system, is angiographically without significant disease.   The left circumflex has nonobstructive plaque in its ostium that appears  no worse than 20-25%.  The circumflex gives off a first obtuse marginal  branch which is tortuous but has no significant angiographic disease.  The remainder of the circumflex is small.   The right coronary artery is dominant.  It is angiographically normal  throughout its proximal and midportions.  The distal vessel has  nonobstructive plaque.  It gives off a large posterior descending branch  which is angiographically normal.  It gives off a medium size  posterolateral branch.   Left ventricular function assessed in the 30 degrees right anterior  oblique projection demonstrates normal left ventricular function with a  left ventricular ejection fraction of 65%.  There is no mitral  regurgitation.   ASSESSMENT:  1.  Nonobstructive coronary artery disease.  2. Normal left ventricular function.   RECOMMENDATIONS:  The patient should continue with medical therapy for  coronary artery disease.  Will plan on discharge later today if there  are no other significant issues that come up.      Veverly Fells. Excell Seltzer, MD  Electronically Signed     MDC/MEDQ  D:  06/25/2006  T:  06/25/2006  Job:  [id number]   cc:   Catalina Pizza, M.D.  Gerrit Friends. Dietrich Pates, MD, [facility]
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
## 2010-10-11 NOTE — Op Note (Signed)
NAME:  [name], [name]          ACCOUNT NO.:  [id number]   MEDICAL RECORD NO.:  [id number]          PATIENT TYPE:  EMS   LOCATION:  ED                            FACILITY:  [facility]   PHYSICIAN:  Tilda Burrow, M.D. DATE OF BIRTH:  [birth year]   DATE OF PROCEDURE:  02/12/2005  DATE OF DISCHARGE:                                 OPERATIVE REPORT   PROCEDURE IN THE EMERGENCY ROOM:  Excision of vulvar sebaceous cyst.   INDICATIONS:  A 75 year old female originally undergoing excision of vulvar  sebaceous in the office but had a tachycardia response to lidocaine with  epinephrine with a reported history which she described as anaphylactic  reaction to epinephrine. Due to this history and recent history of a  myocardial infarction last year, she was transferred to ER for monitoring  and assessment until anesthesia had resolved and been absorbed. At 2 p.m.,  she was then taken for local anesthesia with lidocaine only, 2%, which was  used to infiltrate around the 1-cm sebaceous cyst in the left labia minora.  This was then opened 1-cm in length over the cyst and the sebaceous cyst  peeled out of the underlying connective tissue. Three sutures of 4-0 Prolene  were then necessary to complete closure of the incision. The patient  tolerated the procedure well and received Keflex 500 q.i.d. for 3 days, 250  mg q.i.d. for 3 days. Will follow up five days in the office for suture  removal.      Tilda Burrow, M.D.  Electronically Signed     JVF/MEDQ  D:  02/12/2005  T:  02/12/2005  Job:  [id number]

## 2010-10-11 NOTE — Discharge Summary (Signed)
[name], [name]          ACCOUNT NO.:  [id number]   MEDICAL RECORD NO.:  [id number]          PATIENT TYPE:  INP   LOCATION:  3713                         FACILITY:  [facility]   PHYSICIAN:  Gerrit Friends. Dietrich Pates, MD, FACCDATE OF BIRTH:  March 06, 1936   DATE OF ADMISSION:  06/22/2006  DATE OF DISCHARGE:  06/26/2006                               DISCHARGE SUMMARY   PRIMARY CARDIOLOGIST:  Dr. Dietrich Pates.   PRIMARY CARE PHYSICIAN:  Dr. Shelva Majestic in [place].   PRINCIPAL DIAGNOSIS:  Chest pain.   SECONDARY DIAGNOSES:  1. Hypertension.  2. Hyperlipidemia.  3. History of cerebrovascular accident x2.  4. Hypothyroidism.  5. Asthma.  6. Arthritis.  7. Iron deficiency anemia.  8. Chronic Coumadin anticoagulation, secondary to history of      cerebrovascular accident.  9. Temporomandibular joint surgery.  10.Cataracts.  11.Status post total hysterectomy.  12.History of some joint replacement.  13.Benign left frontal meningioma.  14.Rectocele.  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   ALLERGIES:  DEMEROL, VICODIN, EPINEPHRINE.   PROCEDURES PERFORMED:  Left heart cardiac catheterization.   HISTORY OF PRESENT ILLNESS:  A 75 year old Caucasian female with no  prior history of CAD, who was in her usual state of health until June 02, 2006, when she developed 10/10 substernal chest pain while standing  in line at [facility], without associated symptoms, lasting approximately 1  hour and then resolving with sublingual nitroglycerin at her primary  care Haidyn Kilburg's office, where she had driven in the interim.  She was  then taken via EMS to [facility] ER, where she continued to complain of  mild, 1/10, left chest pain but was otherwise comfortable.  Her symptoms  were not reproducible.  She was subsequently admitted for further  evaluation.   HOSPITAL COURSE:  Following admission, the patient ruled out for MI  and  had no additional chest pain.  The decision was made to pursue left  heart cardiac catheterization once her INR was less or equal to 1.6, and  her Coumadin was held.  Catheterization was performed on January 31,  revealing nonobstructive coronary artery disease with an EF of 65%.  Coumadin was subsequently reinitiated, and she was placed on empiric  Protonix therapy.  She has had no complications post-catheterization and  is being discharged home today in satisfactory condition.   DISCHARGE LABORATORY VALUES:  Hemoglobin 12.7, hematocrit 39.0, WBC 6.1,  platelets 273.  PT 15.3, INR 1.2.  Sodium 142, potassium 3.5, chloride  103, CO2 of 32, BUN 17, creatinine 0.76, glucose 114.  Total bilirubin  0.8, alkaline phosphatase 52, AST 43, ALT 40, total protein 6.5, albumin  3.8, calcium 9.6.  CK 54, MB 2.5, troponin I 0.02.  Total cholesterol  135, triglycerides 158, HDL 53, LDL 50.  TSH 2.646, free T4 1.09.   DISPOSITION:  The patient is being discharged home today in good  condition.   FOLLOWUP APPOINTMENTS:  She is asked to follow up with her primary care  physician, Dr. Shelva Majestic, as previously scheduled, and will  require  Coumadin followup early next week.  She is scheduled to follow up with  Dr. Dietrich Pates on February 20 at 10 a.m.   DISCHARGE MEDICATIONS:  1. Protonix 40 mg every day.  2. Toprol-XL 50 mg every day.  3. Synthroid 50 mcg every day.  4. Lasix 40 mg every day.  5. Lovastatin 40 mg every day.  6. Potassium chloride 20 mEq every day.  7. Coumadin as previously prescribed.  8. Isoniazid 300 mg every day.  9. Nitroglycerin 0.4 mg sublingual p.r.n. chest pain.  10.Multivitamin 1 every day.   OUTSTANDING LABORATORY STUDIES:  None.   DURATION OF DISCHARGE ENCOUNTER:  40 minutes, including physician time.      Nicolasa Ducking, ANP      Gerrit Friends. Dietrich Pates, MD, [facility]  Electronically Signed    CB/MEDQ  D:  06/26/2006  T:  06/27/2006  Job:  [id number]   cc:    Catalina Pizza, M.D.
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
## 2010-10-11 NOTE — Op Note (Signed)
[name], [name]          ACCOUNT NO.:  [id number]   MEDICAL RECORD NO.:  [id number]          PATIENT TYPE:  AMB   LOCATION:  DAY                           FACILITY:  [facility]   PHYSICIAN:  Kassie Mends, M.D.      DATE OF BIRTH:  15-Jan-1936   DATE OF PROCEDURE:  03/10/2006  DATE OF DISCHARGE:                                 OPERATIVE REPORT   REFERRING PHYSICIAN:  Catalina Pizza, M.D.   PROCEDURE:  Ileocolonoscopy.   INDICATION FOR EXAMINATION:  [name] is a 75 year old female who  presents with rectal bleeding while on Coumadin.  Her last colonoscopy was  in 2005.  She had 2 hyperplastic polyps.  She has a father who had colon  cancer diagnosed at age greater than 13.   FINDINGS:  Moderate size internal hemorrhoids.  Otherwise, no polyps,  masses, diverticula, inflammatory changes or vascular ectasia seen.   RECOMMENDATIONS:  1. High-fiber diet.  Citrucel or other fiber supplementation once or twice      daily.  Consider Colace 1 mg 1 to 2 times daily.  2. Follow up with Dr. Cira Servant in 1 month.  3. Screening colonoscopy in 10 years.   MEDICATIONS:  1. Fentanyl 75 mcg IV.  2. Versed 4 mg IV.   PROCEDURE TECHNIQUE:  Physical exam was performed, and informed consent was  obtained from the patient after explaining the benefits, risks and  alternatives to the procedure.  The patient was connected to the monitor and  placed in left lateral position.  Continuous oxygen provided by nasal  cannula, and IV medicine administered through an indwelling cannula.  After  administration of sedation and rectal exam, the patient's rectum was  intubated, and the  scope was passed under direct visualization to the 10-20 cm into the  terminal ileum.  The scope was subsequently removed slowly by carefully  examining the color, texture, anatomy and integrity of mucosa on the way  out.  The patient was recovered in endoscopy suite and discharged to home in  satisfactory condition.      Kassie Mends, M.D.  Electronically Signed     SM/MEDQ  D:  03/10/2006  T:  03/11/2006  Job:  [id number]   cc:   Catalina Pizza, M.D.  Fax: [phone]

## 2010-10-11 NOTE — Procedures (Signed)
NAMEABBIE, [name]          ACCOUNT NO.:  [id number]   MEDICAL RECORD NO.:  [id number]          PATIENT TYPE:  INP   LOCATION:  A228                          FACILITY:  [facility]   PHYSICIAN:  Vida Roller, M.D.   DATE OF BIRTH:  06/19/1935   DATE OF PROCEDURE:  06/02/2005  DATE OF DISCHARGE:                                  ECHOCARDIOGRAM   PRIMARY:  Dr. Butler Denmark.   Tape number [id number], tape count 3087 through 4213.   This is a 75 year old woman with asthma for LV systolic function. Technical  quality of the study is difficult, poor acoustic windows.   M-MODE TRACINGS:  Aorta is 31 mm.   Left atrium is 35 mm.   The septum is 11 mm.   Posterior wall is 11 mm.   Left ventricular diastolic dimension 37 mm.   Left ventricular systolic dimension 23 mm.   TWO-D AND DOPPLER IMAGING:  Left ventricle is normal size with of preserved  LV systolic function. Estimated ejection fraction 60 to 65%. No obvious wall  motion abnormalities were seen.   The right ventricle is normal size with normal systolic function.   Both atria appear to be enlarged.   There is no obvious valvular heart disease by Doppler profile.   The aortic root appears to be slightly enlarged, although this is a  difficult study.   There is no pericardial effusion.   The inferior vena cava appears to be normal size. Comparison with previous  study done in January 2005 shows no significant interval change although the  size of the aorta was not commented on in the 2005 study.      Vida Roller, M.D.  Electronically Signed     JH/MEDQ  D:  06/02/2005  T:  06/03/2005  Job:  [id number]

## 2010-10-11 NOTE — Group Therapy Note (Signed)
NAME:  MELVIN, MARMO          ACCOUNT NO.:  [id number]   MEDICAL RECORD NO.:  [id number]          PATIENT TYPE:  INP   LOCATION:  A213                          FACILITY:  [facility]   PHYSICIAN:  Catalina Pizza, M.D.        DATE OF BIRTH:  1935-12-21   DATE OF PROCEDURE:  12/24/2005  DATE OF DISCHARGE:                                   PROGRESS NOTE   SUBJECTIVE:  [name] is a 75 year old white female admitted for yet  again another stroke.  At this time she only had some numbness and tingling  in her right foot.  Apparently last evening, she also had some jerking  motions in her right leg and right foot as well.  She denies any other  problems as far as chest pain or shortness of breath.  Dr. Gerilyn Pilgrim has been  consulted and awaiting his input.   OBJECTIVE:  VITAL SIGNS:  Temperature is 98.2, blood pressure 143/75, heart  rate 62, respirations are 20.  GENERAL:  This is an elderly white female lying in bed in no acute distress.  HEENT:  No change.  HEART:  Regular rate and rhythm.  No murmurs, gallops or rubs.  ABDOMEN:  Soft, nontender, nondistended.  Bowel sounds.  LUNGS:  Clear to auscultation bilaterally.  EXTREMITIES:  No cyanosis, clubbing or edema.  Positive pulses.  NEUROLOGIC:  She continues to have some decreased sensation in the right  foot up to mid-calf and continues to have some mild weakness in that area as  well.   LABORATORY DATA:  New lab work obtained showed TSH of 0.625, free T4 of  1.17.   IMAGES:  MRI and MRA of brain showed small focal acute ischemic area in the  left posterior frontal parasagittal region and cortical base.  Subacute area  of probable ischemia in the right posterior frontal anterior parietal area.  Nonspecific subcortical white matter changes most likely representative of  ischemic changes or small vessel disease, shows old ischemic changes, shows  mild sinusitis.  MRA showed probable 50% stenosis of left middle cerebral  artery in its  M1 segment.  Also showed an approximate 3 mm x 2.5 mm saccular  outpouching projecting medially from the internal carotid artery in its  caval segment.  This is very suspicious for aneurysm.   ASSESSMENT/PLAN:  1.  Cerebrovascular accident awaiting consult from Dr. Gerilyn Pilgrim.  Unclear if      there is anything else that needs to be done as far as anticoagulation      or whether she may need to be started on Coumadin to help prevent      further problems.  For now will continue with the Aggrenox.  She will      need physical therapy and outpatient therapy and further assessment.      Unclear if she needs specifically inpatient physical therapy.  PT      initially said home health and outpatient PT is some difficulty as the      patient lives on the third floor and it may be difficult for her to  transition up and down stairs in the short term.  2.  Question of carotid aneurysm:  May need to have further evaluation with      angiogram done by Dr. Corliss Skains who may be able to repair this if in      fact it is an aneurysm or whether it might need neurosurgery to take a      look at it.  We will also get Dr. Ronal Fear input on this.  3.  Hypothyroidism:  TSH and free T4 are normal at this time.  Continue      current therapy.  4.  Anxiety/depression:  She is doing well.  Will continue on the Lexapro.  5.  Hyperlipidemia:  Continue on lovastatin.  6.  Right foot fracture:  She is to follow-up with Dr. Romeo Apple next week      for further evaluation of this.      Catalina Pizza, M.D.  Electronically Signed     ZH/MEDQ  D:  12/24/2005  T:  12/25/2005  Job:  [id number]

## 2010-10-11 NOTE — Procedures (Signed)
NAME:  [name], [name]                    ACCOUNT NO.:  [id number]   MEDICAL RECORD NO.:  [id number]                   PATIENT TYPE:  INP   LOCATION:  A226                                 FACILITY:  [facility]   PHYSICIAN:  Vida Roller, M.D.                DATE OF BIRTH:  April 15, 1936   DATE OF PROCEDURE:  12/01/2003  DATE OF DISCHARGE:                                    STRESS TEST   A 75 year old female with questionable nonobstructive coronary artery  disease by catheterization 15 years ago in [place].  No records are  available.  The patient now presents with atypical chest discomfort.  Cardiac enzymes negative x 3 for acute myocardial infarction.  EKG reveals  no ischemic changes.  The patient was originally scheduled for an exercise  Cardiolite; however, secondary to elevated blood pressure, she was changed  to adenosine.   BASELINE DATA:  EKG reveals sinus rhythm at 54 beats per minute with  nonspecific ST abnormalities. Blood pressure is 178/98.   Adenosine was infused over 4-minute protocol with Cardiolite injected at 3  minutes.  The patient reported chest tightness, shortness of breath, and  tingling in her arms which resolved in recovery.  She did complain of neck  pain with no ischemic changes on her EKG.  There were no arrhythmias noted  and no ischemic changes during adenosine infusion.   Final images and results are pending MD review.     ________________________________________  ___________________________________________  Jae Dire, P.A. [facility]                      Vida Roller, M.D.   AB/MEDQ  D:  12/01/2003  T:  12/01/2003  Job:  [id number]

## 2010-10-11 NOTE — Consult Note (Signed)
[name], [name]          ACCOUNT NO.:  [id number]   MEDICAL RECORD NO.:  [id number]          PATIENT TYPE:  AMB   LOCATION:                                FACILITY:  [facility]   PHYSICIAN:  Kassie Mends, M.D.      DATE OF BIRTH:  [birth year]   DATE OF CONSULTATION:  02/26/2006  DATE OF DISCHARGE:                                   CONSULTATION   PROBLEM LIST:  1. Multiple cerebrovascular accidents and transient ischemic attacks  2. Hypertension.  3. Asthma.  4. Colonoscopy in 2005 with two hyperplastic polyps.  5. Father had colon cancer at age greater than 93.  6. Hysterectomy.  7. Hypothyroidism.  8. Chronic anticoagulation:  Coumadin 2.5 mg every Monday, Wednesday,      Friday and then 5 mg Tuesday, Thursday, Saturday, Sunday.  9. Allergy to epinephrine and Demerol   SUBJECTIVE:  [name] is a 75 year old female who is seen today as a  return patient visit.  She says the rectal bleeding still persists, but is  not as bad as it was.  She sees blood throughout the stool.  She is still  seeing rectal bleeding every day.  Last hospitalization for stroke was in  [place].  She reports having a TIA in August2007.  She reports being told by  Dr. Katrinka Blazing that needed a colonoscopy every 3 years.  The report from  August2005 confirms this.  She is not sure the reason why she was told to  have one every 3 years.  The records from Dr. Katrinka Blazing office are unavailable.   MEDICATIONS:  1. Coumadin.  2. Toprol XL 50 mg daily.  3. Lasix 40 mg daily.  4. Potassium chloride 20 mEq once daily.  5. Lovastatin 40 mg b.i.d.  6. Levothyroxine 75 mcg daily.  7. Lexapro 5 mg daily.  8. INH 300 mg daily.  9. Zinc 50 mg daily.  Vitamin C, calcium, vitamin e, multivitamin, Biotin, flax oil, magnesium, B-  complex, Omega 07-1198, selenium.   OBJECTIVE:  VITAL SIGNS:  Weight 141 pounds, height 5 feet, 4 inches,  temperature 98.5, blood pressure 138/70, pulse 64.  GENERAL:  She is in no apparent  distress.  LUNGS:  Clear to auscultation  bilaterally.  CARDIOVASCULAR:  Regular rhythm, no murmur. Normal S1 and S2.  ABDOMEN: Bowel sounds present, soft, nontender, nondistended.  No rebound or  guarding.   LABORATORY:  September26 2007, iron 40 total bili 0.6, AST 37, ALT 37,  albumin 4.6 white count 4.4.  Hemoglobin 13.4, platelet 325, INR 1.3  ferritin 40 (10-291).   ASSESSMENT:  [name] is a 75 year old female who has hematochezia on  Coumadin.  She had a CVA approximately 2 months ago.  She should have a  colonoscopy to further investigate the etiology for her hematochezia.  She,  in spite of her rectal bleeding has a normal hemoglobin.  The differential  diagnosis includes hemorrhoids or rectal polyp.  Thank for allowing me to  see [name] in consultation.  My recommendations follow.   RECOMMENDATIONS:  1. Will discuss anticoagulation management surrounding her colonoscopy.  She will need to be off of Coumadin to perform a colonoscopy so that a      polypectomy may be performed if needed.  We will discuss the need for      overlap therapy with heparin and the need for possible hospital      admission with Dr. Margo Aye.  2. She will follow up with me after the colonoscopy is complete.  I will      contact her after speaking with Dr. Margo Aye to arrange for colonoscopy.   Please feel free to contact me at [phone] with additional questions.      Kassie Mends, M.D.  Electronically Signed     SM/MEDQ  D:  02/27/2006  T:  03/01/2006  Job:  [id number]   cc:   Catalina Pizza, M.D.  Fax: [phone]

## 2011-07-21 ENCOUNTER — Other Ambulatory Visit: Payer: Self-pay | Admitting: Neurology

## 2011-07-21 DIAGNOSIS — G44009 Cluster headache syndrome, unspecified, not intractable: Secondary | ICD-10-CM

## 2011-07-21 DIAGNOSIS — M792 Neuralgia and neuritis, unspecified: Secondary | ICD-10-CM

## 2011-07-21 DIAGNOSIS — M47812 Spondylosis without myelopathy or radiculopathy, cervical region: Secondary | ICD-10-CM

## 2011-07-28 ENCOUNTER — Ambulatory Visit
Admission: RE | Admit: 2011-07-28 | Discharge: 2011-07-28 | Disposition: A | Discharge status: Home / Self Care | Payer: Medicare Other | Source: Ambulatory Visit | Attending: Neurology | Admitting: Neurology

## 2011-07-28 DIAGNOSIS — M792 Neuralgia and neuritis, unspecified: Secondary | ICD-10-CM

## 2011-07-28 DIAGNOSIS — M47812 Spondylosis without myelopathy or radiculopathy, cervical region: Secondary | ICD-10-CM

## 2011-07-28 DIAGNOSIS — G44009 Cluster headache syndrome, unspecified, not intractable: Secondary | ICD-10-CM

## 2011-10-16 ENCOUNTER — Other Ambulatory Visit (HOSPITAL_COMMUNITY): Payer: Self-pay | Admitting: Internal Medicine

## 2011-10-16 ENCOUNTER — Ambulatory Visit (HOSPITAL_COMMUNITY)
Admission: RE | Admit: 2011-10-16 | Discharge: 2011-10-16 | Disposition: A | Discharge status: Home / Self Care | Payer: Medicare Other | Source: Ambulatory Visit | Attending: Internal Medicine | Admitting: Internal Medicine

## 2011-10-16 DIAGNOSIS — N644 Mastodynia: Secondary | ICD-10-CM

## 2011-10-16 DIAGNOSIS — R0781 Pleurodynia: Secondary | ICD-10-CM

## 2011-10-16 DIAGNOSIS — J449 Chronic obstructive pulmonary disease, unspecified: Secondary | ICD-10-CM | POA: Insufficient documentation

## 2011-10-16 DIAGNOSIS — J4489 Other specified chronic obstructive pulmonary disease: Secondary | ICD-10-CM | POA: Insufficient documentation

## 2011-10-16 DIAGNOSIS — R079 Chest pain, unspecified: Secondary | ICD-10-CM | POA: Insufficient documentation

## 2011-10-16 DIAGNOSIS — R059 Cough, unspecified: Secondary | ICD-10-CM | POA: Insufficient documentation

## 2011-10-16 DIAGNOSIS — R05 Cough: Secondary | ICD-10-CM | POA: Insufficient documentation

## 2011-10-28 ENCOUNTER — Telehealth: Payer: Self-pay | Admitting: Gastroenterology

## 2011-10-28 NOTE — Telephone Encounter (Signed)
PLEASE CALL PT. SHE WAS SEEN IN AUG 2007 FOR AN OPV AND HAD HER TCS IN OCT 2007. IF SHE IS NOT ON COUMADIN, SHE DOES NOT NEEDS A VISIT PRIOR TO TCS. IF SHE IS ON COUMADIN SHE NEEDS TO BE SEEN BY DR. Estle Sabella PRIOR TO HER TCS.

## 2011-10-28 NOTE — Telephone Encounter (Signed)
Pt called to set up her colonoscopy. Her last one with in [id number]. I explained to her that it was office policy to bring patient in for OV if there is a HX of polyps and/or cancer. Pt said that SF knew all this before in 2007 and she shouldn't have to come back for OV. I told her all I could do is get with SF and see if she's OK with patient being triaged over the phone. Please call her back at [phone]

## 2011-10-29 NOTE — Telephone Encounter (Signed)
LMOM to call.

## 2011-10-29 NOTE — Telephone Encounter (Signed)
Pt called back and was informed. [name] is scheduling an OV.

## 2011-11-18 ENCOUNTER — Ambulatory Visit (INDEPENDENT_AMBULATORY_CARE_PROVIDER_SITE_OTHER): Payer: Medicare Other | Admitting: Gastroenterology

## 2011-11-18 ENCOUNTER — Other Ambulatory Visit: Payer: Self-pay | Admitting: Gastroenterology

## 2011-11-18 ENCOUNTER — Telehealth: Payer: Self-pay | Admitting: Gastroenterology

## 2011-11-18 VITALS — BP 134/68 | HR 54 | Temp 97.5°F | Ht 63.0 in | Wt 137.8 lb

## 2011-11-18 DIAGNOSIS — Z860101 Personal history of adenomatous and serrated colon polyps: Secondary | ICD-10-CM | POA: Insufficient documentation

## 2011-11-18 DIAGNOSIS — Z7901 Long term (current) use of anticoagulants: Secondary | ICD-10-CM

## 2011-11-18 DIAGNOSIS — Z800 Family history of malignant neoplasm of digestive organs: Secondary | ICD-10-CM

## 2011-11-18 DIAGNOSIS — Z86010 Personal history of colonic polyps: Secondary | ICD-10-CM

## 2011-11-18 MED ORDER — PEG 3350-KCL-NA BICARB-NACL 420 G PO SOLR: ORAL | Status: AC

## 2011-11-18 NOTE — Assessment & Plan Note (Signed)
H/O adenomatous colon polyps and FH of CRC in 1st degree relative at advanced age. Last TCS 2008. Due for f/u at this time. As done previously, we will hold her coumadin for four days prior to procedure.  I have discussed the risks, alternatives, benefits with regards to but not limited to the risk of reaction to medication, bleeding, infection, perforation and the patient is agreeable to proceed. Written consent to be obtained.  Further instructions regarding coumadin to be given day of procedure.

## 2011-11-18 NOTE — Progress Notes (Signed)
Faxed to PCP

## 2011-11-18 NOTE — Progress Notes (Signed)
Primary Care Physician:  Dwana Melena, MD  Primary Gastroenterologist:  [name], MD   Chief Complaint  Patient presents with  . Colonoscopy    HPI:  [name] is a 76 y.o. female here to schedule surveillance TCS given h/o adenomatous colon polyps and FH CRC.  She is on chronic coumadin for h/o two "sizable" strokes. Couple of years since last TIA/CVA. No constipation, diarrhea. BM 2-3 per day. No bleeding, melena. No abdominal pain. Appetite good. GERD well-controlled on omeprazole. No dysphagia.   Current Outpatient Prescriptions  Medication Sig Dispense Refill  . Ascorbic Acid (VITAMIN C) 1000 MG tablet Take 1,000 mg by mouth daily.      Marland Kitchen b complex vitamins tablet Take 1 tablet by mouth daily.      . cholecalciferol (VITAMIN D) 1000 UNITS tablet Take 1,000 Units by mouth daily.      . Flaxseed, Linseed, (FLAXSEED OIL) 1000 MG CAPS Take by mouth.      . furosemide (LASIX) 40 MG tablet Take 40 mg by mouth daily.      Marland Kitchen levothyroxine (SYNTHROID, LEVOTHROID) 50 MCG tablet Take 50 mcg by mouth daily.      . metoprolol (LOPRESSOR) 50 MG tablet Take 50 mg by mouth 2 (two) times daily.      . NON FORMULARY Calcium 1200 mg plus D 200 IU   daily      . NON FORMULARY B complex  One daily      . NON FORMULARY Magnesium 250 mg daily      . omeprazole (PRILOSEC) 20 MG capsule Take 20 mg by mouth daily.      . potassium chloride (K-DUR) 10 MEQ tablet Take 10 mEq by mouth 2 (two) times daily.      . vitamin E (VITAMIN E) 400 UNIT capsule Take 400 Units by mouth daily.      Marland Kitchen warfarin (COUMADIN) 3 MG tablet Take 3 mg by mouth daily. Takes 3 mg x 6 days a week and 5 mg on other day        Allergies as of 11/18/2011 - Review Complete 11/18/2011  Allergen Reaction Noted  . Demerol (meperidine) Other (See Comments) 11/18/2011  . Epinephrine Anaphylaxis 11/18/2011  . Vicodin (hydrocodone-acetaminophen) Other (See Comments) 11/18/2011    Past Medical History  Diagnosis Date  . CVA  (cerebral vascular accident)   . TIA (transient ischemic attack)   . HTN (hypertension)   . Asthma   . Hypothyroidism   . Hyperplastic colon polyp 2005  . Hemorrhoids 2007  . Adenomatous colon polyp 2008  . Diverticulosis   . Narcolepsy     Past Surgical History  Procedure Date  . Abdominal hysterectomy     age 37, complicated by poor wound healing, followed by revision of scar and radiation for ?malignancy  . Colonoscopy 2005    2 hyperplastic polyps  . Colonoscopy 2007    Dr. Darrick Penna- hemorrhoids  . Colonoscopy 2008    Dr. Steva Ready polyp- rare sigmoid diverticulosis, internal hemorrhoids  . Eye surgery 2010  . Breast enhancement surgery   . Eye surgery   . Right thumb surgery     Family History  Problem Relation Age of Onset  . Colon cancer Father     age 58  . Prostate cancer Father   . Pancreatic cancer Mother     age 55    History   Social History  . Marital Status: Widowed    Spouse Name: N/A  Number of Children: N/A  . Years of Education: N/A   Occupational History  . Not on file.   Social History Main Topics  . Smoking status: Never Smoker   . Smokeless tobacco: Not on file  . Alcohol Use: No  . Drug Use: No  . Sexually Active: Not on file   Other Topics Concern  . Not on file   Social History Narrative  . No narrative on file      ROS:  General: Negative for anorexia, weight loss, fever, chills, fatigue, weakness. Eyes: Negative for vision changes.  ENT: Negative for hoarseness, difficulty swallowing , nasal congestion. CV: Negative for chest pain, angina, palpitations, dyspnea on exertion, peripheral edema.  Respiratory: Negative for dyspnea at rest, dyspnea on exertion, cough, sputum, wheezing.  GI: See history of present illness. GU:  Negative for dysuria, hematuria, urinary incontinence, urinary frequency, nocturnal urination.  MS: Negative for joint pain, low back pain.  Derm: Negative for rash or itching.  Neuro:  Negative for weakness, abnormal sensation, seizure, frequent headaches, memory loss, confusion.  Psych: Negative for anxiety, depression, suicidal ideation, hallucinations.  Endo: Negative for unusual weight change.  Heme: Negative for bruising or bleeding. Allergy: Negative for rash or hives.    Physical Examination:  BP 134/68  Pulse 54  Temp 97.5 F (36.4 C) (Tympanic)  Ht 5\' 3"  (1.6 m)  Wt 137 lb 12.8 oz (62.506 kg)  BMI 24.41 kg/m2   General: Well-nourished, well-developed in no acute distress.  Head: Normocephalic, atraumatic.   Eyes: Conjunctiva pink, no icterus. Mouth: Oropharyngeal mucosa moist and pink , no lesions erythema or exudate. Neck: Supple without thyromegaly, masses, or lymphadenopathy.  Lungs: Clear to auscultation bilaterally.  Heart: Regular rate and rhythm, no murmurs rubs or gallops.  Abdomen: Bowel sounds are normal, nontender, nondistended, no hepatosplenomegaly or masses, no abdominal bruits or hernia , no rebound or guarding.   Rectal: defer Extremities: No lower extremity edema. No clubbing or deformities.  Neuro: Alert and oriented x 4 , grossly normal neurologically.  Skin: Warm and dry, no rash or jaundice.   Psych: Alert and cooperative, normal mood and affect.

## 2011-11-18 NOTE — Patient Instructions (Addendum)
We have scheduled you for a colonoscopy. Please see separate instructions. 

## 2011-11-18 NOTE — Telephone Encounter (Signed)
Patient called and stated the the movieprep Rx that she was given in the office was to expensive and wanted to know if we would call her in something differently and I faxed in Tri lyte Prep to [facility] in [place] and mailed the patient new instructions

## 2011-11-19 ENCOUNTER — Encounter (HOSPITAL_COMMUNITY): Payer: Self-pay

## 2011-11-28 MED ORDER — SODIUM CHLORIDE 0.45 % IV SOLN
Freq: Once | INTRAVENOUS | Status: AC
  Administered 2011-12-01: 12:00:00 via INTRAVENOUS

## 2011-12-01 ENCOUNTER — Encounter (HOSPITAL_COMMUNITY): Payer: Self-pay | Admitting: *Deleted

## 2011-12-01 ENCOUNTER — Ambulatory Visit (HOSPITAL_COMMUNITY)
Admission: RE | Admit: 2011-12-01 | Discharge: 2011-12-01 | Disposition: A | Discharge status: Home Health | Payer: Medicare Other | Source: Ambulatory Visit | Attending: Gastroenterology | Admitting: Gastroenterology

## 2011-12-01 ENCOUNTER — Encounter (HOSPITAL_COMMUNITY)
Admission: RE | Disposition: A | Discharge status: Home Health | Payer: Self-pay | Source: Ambulatory Visit | Attending: Gastroenterology

## 2011-12-01 DIAGNOSIS — Z7901 Long term (current) use of anticoagulants: Secondary | ICD-10-CM | POA: Insufficient documentation

## 2011-12-01 DIAGNOSIS — D126 Benign neoplasm of colon, unspecified: Secondary | ICD-10-CM | POA: Insufficient documentation

## 2011-12-01 DIAGNOSIS — Z800 Family history of malignant neoplasm of digestive organs: Secondary | ICD-10-CM | POA: Insufficient documentation

## 2011-12-01 DIAGNOSIS — Z1211 Encounter for screening for malignant neoplasm of colon: Secondary | ICD-10-CM

## 2011-12-01 DIAGNOSIS — Z860100 Personal history of colon polyps, unspecified: Secondary | ICD-10-CM | POA: Insufficient documentation

## 2011-12-01 DIAGNOSIS — K5730 Diverticulosis of large intestine without perforation or abscess without bleeding: Secondary | ICD-10-CM

## 2011-12-01 DIAGNOSIS — D129 Benign neoplasm of anus and anal canal: Secondary | ICD-10-CM

## 2011-12-01 DIAGNOSIS — D128 Benign neoplasm of rectum: Secondary | ICD-10-CM

## 2011-12-01 DIAGNOSIS — K648 Other hemorrhoids: Secondary | ICD-10-CM | POA: Insufficient documentation

## 2011-12-01 DIAGNOSIS — Z79899 Other long term (current) drug therapy: Secondary | ICD-10-CM | POA: Insufficient documentation

## 2011-12-01 DIAGNOSIS — Z8673 Personal history of transient ischemic attack (TIA), and cerebral infarction without residual deficits: Secondary | ICD-10-CM | POA: Insufficient documentation

## 2011-12-01 DIAGNOSIS — I10 Essential (primary) hypertension: Secondary | ICD-10-CM | POA: Insufficient documentation

## 2011-12-01 DIAGNOSIS — Z09 Encounter for follow-up examination after completed treatment for conditions other than malignant neoplasm: Secondary | ICD-10-CM | POA: Insufficient documentation

## 2011-12-01 HISTORY — PX: COLONOSCOPY: SHX5424

## 2011-12-01 SURGERY — COLONOSCOPY
Anesthesia: Moderate Sedation

## 2011-12-01 MED ORDER — FENTANYL CITRATE 0.05 MG/ML IJ SOLN
INTRAMUSCULAR | Status: AC
  Filled 2011-12-01: qty 2

## 2011-12-01 MED ORDER — MIDAZOLAM HCL 5 MG/5ML IJ SOLN
INTRAMUSCULAR | Status: DC | PRN
  Administered 2011-12-01: 1 mg via INTRAVENOUS
  Administered 2011-12-01: 2 mg via INTRAVENOUS
  Administered 2011-12-01: 1 mg via INTRAVENOUS

## 2011-12-01 MED ORDER — MIDAZOLAM HCL 5 MG/5ML IJ SOLN
INTRAMUSCULAR | Status: AC
  Filled 2011-12-01: qty 10

## 2011-12-01 MED ORDER — FENTANYL CITRATE 0.05 MG/ML IJ SOLN
INTRAMUSCULAR | Status: DC | PRN
  Administered 2011-12-01 (×3): 25 ug via INTRAVENOUS

## 2011-12-01 MED ORDER — STERILE WATER FOR IRRIGATION IR SOLN
Status: DC | PRN
  Administered 2011-12-01: 12:00:00

## 2011-12-01 NOTE — H&P (Signed)
Primary Care Physician:  Dwana Melena, MD Primary Gastroenterologist:  Dr. Darrick Penna  Pre-Procedure History & Physical: HPI:  [name] is a 76 y.o. female here for  FAMILY Hx COLON CA/PERSONAL HISTORY OF POLYPS.   Past Medical History  Diagnosis Date  . CVA (cerebral vascular accident)   . TIA (transient ischemic attack)   . HTN (hypertension)   . Asthma   . Hypothyroidism   . Hyperplastic colon polyp 2005  . Hemorrhoids 2007  . Adenomatous colon polyp 2008  . Diverticulosis   . Narcolepsy     Past Surgical History  Procedure Date  . Abdominal hysterectomy     age 70, complicated by poor wound healing, followed by revision of scar and radiation for ?malignancy  . Colonoscopy 2005    2 hyperplastic polyps  . Colonoscopy 2007    Dr. Darrick Penna- hemorrhoids  . Colonoscopy 2008    Dr. Steva Ready polyp- rare sigmoid diverticulosis, internal hemorrhoids  . Eye surgery 2010  . Breast enhancement surgery   . Eye surgery   . Right thumb surgery     Prior to Admission medications   Medication Sig Start Date End Date Taking? Authorizing Provider  Ascorbic Acid (VITAMIN C) 1000 MG tablet Take 1,000 mg by mouth daily.   Yes Historical Provider, MD  B Complex Vitamins (VITAMIN B COMPLEX) TABS Take 1 tablet by mouth daily.   Yes Historical Provider, MD  Biotin 300 MCG TABS Take 1 tablet by mouth daily.   Yes Historical Provider, MD  BLACK COHOSH PO Take 1 capsule by mouth daily. Patient states strength is 300mg    Yes Historical Provider, MD  CALCIUM CARBONATE PO Take 1 tablet by mouth daily. Patient states medication is calcium 1200 mg without vitamin D.   Yes Historical Provider, MD  cholecalciferol (VITAMIN D) 1000 UNITS tablet Take 1,000 Units by mouth daily.   Yes Historical Provider, MD  CYANOCOBALAMIN PO Take 1 tablet by mouth daily. Patient unsure of strength   Yes Historical Provider, MD  Ferrous Gluconate (IRON) 240 (27 FE) MG TABS Take 1 tablet by mouth daily.    Yes Historical Provider, MD  Flaxseed, Linseed, (FLAXSEED OIL) 1000 MG CAPS Take 1 capsule by mouth 2 (two) times daily.    Yes Historical Provider, MD  furosemide (LASIX) 40 MG tablet Take 40 mg by mouth daily.   Yes Historical Provider, MD  Lecithin 1200 MG CAPS Take 1 capsule by mouth daily.    Yes Historical Provider, MD  levothyroxine (SYNTHROID, LEVOTHROID) 50 MCG tablet Take 50 mcg by mouth daily.   Yes Historical Provider, MD  LUTEIN PO Take 1 capsule by mouth daily. Strength varies based on what is purchased at the time.   Yes Historical Provider, MD  Magnesium 250 MG TABS Take 1 tablet by mouth daily.   Yes Historical Provider, MD  metoprolol (LOPRESSOR) 50 MG tablet Take 50 mg by mouth 2 (two) times daily.   Yes Historical Provider, MD  omeprazole (PRILOSEC) 20 MG capsule Take 20 mg by mouth daily.   Yes Historical Provider, MD  potassium chloride (K-DUR) 10 MEQ tablet Take 10 mEq by mouth 2 (two) times daily.   Yes Historical Provider, MD  PROAIR HFA 108 (90 BASE) MCG/ACT inhaler Inhale 1 puff into the lungs Once daily as needed. For shortness of breath 09/03/11  Yes Historical Provider, MD  vitamin E 400 UNIT capsule Take 400 Units by mouth daily.   Yes Historical Provider, MD  ZETIA 10 MG tablet  Take 1 tablet by mouth Daily. 11/10/11  Yes Historical Provider, MD  Zinc 50 MG TABS Take 1 tablet by mouth daily.   Yes Historical Provider, MD  warfarin (COUMADIN) 3 MG tablet Take 3 mg by mouth daily. Takes 3 mg for 6 days of the week (Monday-Saturday)    Historical Provider, MD  warfarin (COUMADIN) 5 MG tablet Take 5 mg by mouth once a week. Takes 5 mg on Sundays.    Historical Provider, MD    Allergies as of 11/18/2011 - Review Complete 11/18/2011  Allergen Reaction Noted  . Demerol (meperidine) Other (See Comments) 11/18/2011  . Epinephrine Anaphylaxis 11/18/2011  . Vicodin (hydrocodone-acetaminophen) Other (See Comments) 11/18/2011    Family History  Problem Relation Age of Onset    . Colon cancer Father     age 86  . Prostate cancer Father   . Pancreatic cancer Mother     age 39    History   Social History  . Marital Status: Widowed    Spouse Name: N/A    Number of Children: N/A  . Years of Education: N/A   Occupational History  . Not on file.   Social History Main Topics  . Smoking status: Former Smoker -- 0.2 packs/day for 1 years  . Smokeless tobacco: Not on file  . Alcohol Use: No  . Drug Use: No  . Sexually Active: Not on file   Other Topics Concern  . Not on file   Social History Narrative  . No narrative on file    Review of Systems: See HPI, otherwise negative ROS   Physical Exam: BP 164/66  Pulse 55  Temp 97.8 F (36.6 C) (Oral)  Resp 19  Ht 5\' 3"  (1.6 m)  Wt 137 lb (62.143 kg)  BMI 24.27 kg/m2  SpO2 97% General:   Alert,  pleasant and cooperative in NAD Head:  Normocephalic and atraumatic. Neck:  Supple;  Lungs:  Clear throughout to auscultation.    Heart:  Regular rate and rhythm. Abdomen:  Soft, nontender and nondistended. Normal bowel sounds, without guarding, and without rebound.   Neurologic:  Alert and  oriented x4;  grossly normal neurologically.  Impression/Plan:     FAMILY HX COLON CA/PERSONAL HISTORY OF POLYPS.  PLAN:  1. TCS TODAY

## 2011-12-01 NOTE — Op Note (Signed)
[facility] [address] [place], [place]  [address]  COLONOSCOPY PROCEDURE REPORT  PATIENT:  Cynthia, [name]  MR#:  [id number] BIRTHDATE:  12-20-1935, 76 yrs. old  GENDER:  female  ENDOSCOPIST:  [name], MD REF. BY:  Catalina Pizza, M.D. ASSISTANT:  PROCEDURE DATE:  12/01/2011 PROCEDURE:  Colonoscopy with biopsy and snare polypectomy  INDICATIONS:  PERSONAL HISTORY OF POLYPS-LAST TCS 2007 FAMILY HX OF COLON CANCER  MEDICATIONS:   Fentanyl 75 mcg IV, Versed 4 mg IV  DESCRIPTION OF PROCEDURE:    Physical exam was performed. Informed consent was obtained from the patient after explaining the benefits, risks, and alternatives to procedure.  The patient was connected to monitor and placed in left lateral position. Continuous oxygen was provided by nasal cannula and IV medicine administered through an indwelling cannula.  After administration of sedation and rectal exam, the patient's rectum was intubated and the EC-[id number] ([id number]) colonoscope was advanced under direct visualization to the cecum.  The scope was removed slowly by carefully examining the color, texture, anatomy, and integrity mucosa on the way out.  The patient was recovered in endoscopy and discharged home in satisfactory condition. <<PROCEDUREIMAGES>>  FINDINGS:  There were FIVE SESSILE polypOID LESIONS (3-4MM) identified and removed. in the ascending colon.  There were TWO polypOID LESIONs (3-6 MM) identified and removed. in the proximal transverse colon.  POLYPS REMOVED VIA COLD FORCEPS. A 6 MM sessile polyp was found in the recto-sigmoid colon & REMOVED VIA SNARE CAUTERY.  FREQUENT Diverticula were found in the sigmoid to descending colon segments. MODERATE Internal Hemorrhoids were found.  PREP QUALITY:  EXCELLENT CECAL W/D TIME:    39 minutes  COMPLICATIONS:    None  ENDOSCOPIC IMPRESSION: 1) Polyps, multiple in the ascending colon 2) Polyps, multiple in the proximal transverse  colon 3) Sessile polyp in the recto-sigmoid colon 4) Internal hemorrhoids MILD DIVERTICULOSIS  RECOMMENDATIONS: AWAIT BIOPSIES HIGH FIBER DIET RESTART COUMADIN JUL 13. CONSIDER TCS IN 5 YEARS WITH OVERTBE IF BENEFITS OUTWEIGH THE RISKS  REPEAT EXAM:  No  ______________________________ [name], MD  CC:  Catalina Pizza, M.D.  n. eSIGNED:   Daniela Hernan at 12/01/2011 01:42 PM  Loleta Dicker, [phone]

## 2011-12-04 ENCOUNTER — Encounter (HOSPITAL_COMMUNITY): Payer: Self-pay | Admitting: Gastroenterology

## 2011-12-06 ENCOUNTER — Telehealth: Payer: Self-pay | Admitting: Gastroenterology

## 2011-12-08 ENCOUNTER — Encounter: Payer: Self-pay | Admitting: Gastroenterology

## 2011-12-08 NOTE — Telephone Encounter (Signed)
Please call pt. She had a simple adenomas removed from her colon. FOLLOW A High fiber diet. TCS in 5 years IF THE BENEFITS OUTWEIGH THE RISKS.

## 2011-12-08 NOTE — Telephone Encounter (Signed)
Pt aware of results 

## 2011-12-08 NOTE — Telephone Encounter (Signed)
Forwarded to Dawn  

## 2011-12-08 NOTE — Telephone Encounter (Signed)
Results faxed to Dr Dwana Melena

## 2011-12-08 NOTE — Telephone Encounter (Signed)
5 year recall put in

## 2011-12-08 NOTE — Telephone Encounter (Signed)
ERROR

## 2011-12-09 NOTE — Progress Notes (Signed)
REVIEWED.  

## 2012-09-01 ENCOUNTER — Other Ambulatory Visit (HOSPITAL_COMMUNITY): Payer: Self-pay | Admitting: *Deleted

## 2012-09-01 ENCOUNTER — Ambulatory Visit (HOSPITAL_COMMUNITY)
Admission: RE | Admit: 2012-09-01 | Discharge: 2012-09-01 | Disposition: A | Discharge status: Home / Self Care | Payer: MEDICARE | Source: Ambulatory Visit | Attending: *Deleted | Admitting: *Deleted

## 2012-09-01 DIAGNOSIS — R05 Cough: Secondary | ICD-10-CM

## 2012-09-01 DIAGNOSIS — R059 Cough, unspecified: Secondary | ICD-10-CM | POA: Insufficient documentation

## 2012-09-01 DIAGNOSIS — R911 Solitary pulmonary nodule: Secondary | ICD-10-CM | POA: Insufficient documentation

## 2012-09-20 ENCOUNTER — Ambulatory Visit (HOSPITAL_COMMUNITY)
Admission: RE | Admit: 2012-09-20 | Discharge: 2012-09-20 | Disposition: A | Discharge status: Home / Self Care | Payer: Medicare Other | Source: Ambulatory Visit | Attending: Internal Medicine | Admitting: Internal Medicine

## 2012-09-20 ENCOUNTER — Encounter: Payer: Self-pay | Admitting: Internal Medicine

## 2012-09-20 ENCOUNTER — Ambulatory Visit (INDEPENDENT_AMBULATORY_CARE_PROVIDER_SITE_OTHER): Payer: Medicare Other | Admitting: Internal Medicine

## 2012-09-20 ENCOUNTER — Institutional Professional Consult (permissible substitution): Payer: MEDICARE | Admitting: Pulmonary Disease

## 2012-09-20 ENCOUNTER — Encounter (HOSPITAL_COMMUNITY): Payer: Self-pay

## 2012-09-20 VITALS — BP 130/76 | HR 57 | Ht 62.0 in | Wt 143.4 lb

## 2012-09-20 DIAGNOSIS — R590 Localized enlarged lymph nodes: Secondary | ICD-10-CM

## 2012-09-20 DIAGNOSIS — R222 Localized swelling, mass and lump, trunk: Secondary | ICD-10-CM

## 2012-09-20 DIAGNOSIS — R0781 Pleurodynia: Secondary | ICD-10-CM

## 2012-09-20 DIAGNOSIS — J9859 Other diseases of mediastinum, not elsewhere classified: Secondary | ICD-10-CM

## 2012-09-20 DIAGNOSIS — I10 Essential (primary) hypertension: Secondary | ICD-10-CM | POA: Insufficient documentation

## 2012-09-20 DIAGNOSIS — R071 Chest pain on breathing: Secondary | ICD-10-CM | POA: Insufficient documentation

## 2012-09-20 DIAGNOSIS — R0789 Other chest pain: Secondary | ICD-10-CM

## 2012-09-20 DIAGNOSIS — R599 Enlarged lymph nodes, unspecified: Secondary | ICD-10-CM

## 2012-09-20 LAB — BASIC METABOLIC PANEL
BUN: 29 mg/dL — ABNORMAL HIGH (ref 6–23)
CO2: 35 mEq/L — ABNORMAL HIGH (ref 19–32)
Calcium: 10.2 mg/dL (ref 8.4–10.5)
Creatinine, Ser: 0.92 mg/dL (ref 0.50–1.10)
GFR calc Af Amer: 69 mL/min — ABNORMAL LOW (ref >90)

## 2012-09-20 MED ORDER — IOHEXOL 350 MG/ML SOLN
100.0000 mL | Freq: Once | INTRAVENOUS | Status: AC | PRN
  Administered 2012-09-20: 100 mL via INTRAVENOUS

## 2012-09-20 MED ORDER — PROMETHAZINE-CODEINE 6.25-10 MG/5ML PO SYRP: 5.0000 mL | ORAL_SOLUTION | ORAL | Status: DC | PRN
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
## 2012-09-20 NOTE — Patient Instructions (Addendum)
Order- Schedule CT chest, PE protocol with contrast    Dx pleuritic R  Chest pain, Left hilar adenopathy   To be done at [facility]  Try to brace your ribs with a small pillow when you cough  Script for cough syrup to use if needed  Continue Symbicort inhaler- 2 puffs then rinse mouth, twice daily  Ok to use the blue Ventolin HFA rescue inhaler    2 puffs, up to 4 times daily as needed for chest tightness, wheeze, cough  You can stop using the brown/ orange Flovent inhaler  Ok to use the tramadol as directed.

## 2012-09-26 DIAGNOSIS — R079 Chest pain, unspecified: Secondary | ICD-10-CM | POA: Insufficient documentation

## 2012-09-26 DIAGNOSIS — J9859 Other diseases of mediastinum, not elsewhere classified: Secondary | ICD-10-CM | POA: Insufficient documentation

## 2012-09-26 NOTE — Assessment & Plan Note (Signed)
Her smoking history was minimal and remote but an abnormality like this is indication for chest CT. Plan-the chest

## 2012-09-26 NOTE — Assessment & Plan Note (Addendum)
This does not seem like pleurisy, which would not cause tenderness to direct pressure. Most likely, she cracked rib or tore intracostal muscle with cough. Plan-CT Scan of chest, cough syrup, discussed appropriate use of Symbicort. Brace chest wall with a pillow when coughing

## 2012-10-14 ENCOUNTER — Encounter: Payer: Self-pay | Admitting: Internal Medicine

## 2012-10-14 ENCOUNTER — Other Ambulatory Visit: Payer: Medicare Other

## 2012-10-14 ENCOUNTER — Ambulatory Visit (INDEPENDENT_AMBULATORY_CARE_PROVIDER_SITE_OTHER): Payer: Medicare Other | Admitting: Internal Medicine

## 2012-10-14 VITALS — BP 124/76 | HR 49 | Ht 62.0 in | Wt 142.0 lb

## 2012-10-14 DIAGNOSIS — J9859 Other diseases of mediastinum, not elsewhere classified: Secondary | ICD-10-CM

## 2012-10-14 DIAGNOSIS — R590 Localized enlarged lymph nodes: Secondary | ICD-10-CM

## 2012-10-14 DIAGNOSIS — J309 Allergic rhinitis, unspecified: Secondary | ICD-10-CM

## 2012-10-14 DIAGNOSIS — R0781 Pleurodynia: Secondary | ICD-10-CM

## 2012-10-14 DIAGNOSIS — B370 Candidal stomatitis: Secondary | ICD-10-CM

## 2012-10-14 DIAGNOSIS — R222 Localized swelling, mass and lump, trunk: Secondary | ICD-10-CM

## 2012-10-14 DIAGNOSIS — R071 Chest pain on breathing: Secondary | ICD-10-CM

## 2012-10-14 DIAGNOSIS — R0789 Other chest pain: Secondary | ICD-10-CM

## 2012-10-14 LAB — BASIC METABOLIC PANEL
CO2: 30 mEq/L (ref 19–32)
Calcium: 9.1 mg/dL (ref 8.4–10.5)
Creatinine, Ser: 0.9 mg/dL (ref 0.4–1.2)
GFR: 62.41 mL/min (ref >60.00)
Glucose, Bld: 94 mg/dL (ref 70–99)
Sodium: 141 mEq/L (ref 135–145)

## 2012-10-14 MED ORDER — AEROCHAMBER MV MISC: Status: DC

## 2012-10-14 MED ORDER — FLUCONAZOLE 150 MG PO TABS: 150.0000 mg | ORAL_TABLET | Freq: Once | ORAL | Status: DC

## 2012-10-14 MED ORDER — BENZONATATE 200 MG PO CAPS: 200.0000 mg | ORAL_CAPSULE | Freq: Three times a day (TID) | ORAL | Status: DC | PRN

## 2012-10-14 NOTE — Patient Instructions (Addendum)
Script sent for benzonatate for cough to use as needed  Script sent for diflucan tabs to clear yeast from throat. This will affect your coumadin, but should be ok just for 3 days.  Script for Aerochamber spacer tube to use with either Symbicort or Dulera to reduce chance of yeast in your throat.  Order- lab Allergy profile  Dx allergic rhinitis
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
## 2012-10-22 NOTE — Progress Notes (Signed)
Quick Note:  LMTCB ______ 

## 2012-10-25 ENCOUNTER — Telehealth: Payer: Self-pay | Admitting: Internal Medicine

## 2012-10-25 NOTE — Telephone Encounter (Signed)
I spoke with patient about results and she verbalized understanding and had no questions 

## 2012-10-28 ENCOUNTER — Encounter: Payer: Self-pay | Admitting: Internal Medicine

## 2012-10-28 DIAGNOSIS — B370 Candidal stomatitis: Secondary | ICD-10-CM | POA: Insufficient documentation

## 2012-10-28 NOTE — Assessment & Plan Note (Signed)
Watch with occasional CXR

## 2012-10-28 NOTE — Assessment & Plan Note (Addendum)
Contributes to hoarseness. Educated on mouth care. Plan- diflucan. Spacer for steroid inhaler. Allergy profile

## 2012-10-28 NOTE — Assessment & Plan Note (Signed)
Explained this really seems musculoskeletal.  Plan- symptomatic therapy

## 2012-12-15 ENCOUNTER — Emergency Department (HOSPITAL_COMMUNITY)
Admission: EM | Admit: 2012-12-15 | Discharge: 2012-12-15 | Disposition: A | Discharge status: Home / Self Care | Payer: Medicare Other | Attending: Emergency Medicine | Admitting: Emergency Medicine

## 2012-12-15 ENCOUNTER — Emergency Department (HOSPITAL_COMMUNITY): Payer: Medicare Other

## 2012-12-15 ENCOUNTER — Encounter (HOSPITAL_COMMUNITY): Payer: Self-pay | Admitting: Emergency Medicine

## 2012-12-15 DIAGNOSIS — G8929 Other chronic pain: Secondary | ICD-10-CM | POA: Insufficient documentation

## 2012-12-15 DIAGNOSIS — J45901 Unspecified asthma with (acute) exacerbation: Secondary | ICD-10-CM | POA: Insufficient documentation

## 2012-12-15 DIAGNOSIS — Y9289 Other specified places as the place of occurrence of the external cause: Secondary | ICD-10-CM | POA: Insufficient documentation

## 2012-12-15 DIAGNOSIS — S6990XA Unspecified injury of unspecified wrist, hand and finger(s), initial encounter: Secondary | ICD-10-CM | POA: Insufficient documentation

## 2012-12-15 DIAGNOSIS — Z860100 Personal history of colon polyps, unspecified: Secondary | ICD-10-CM | POA: Insufficient documentation

## 2012-12-15 DIAGNOSIS — Z7901 Long term (current) use of anticoagulants: Secondary | ICD-10-CM | POA: Insufficient documentation

## 2012-12-15 DIAGNOSIS — Z8719 Personal history of other diseases of the digestive system: Secondary | ICD-10-CM | POA: Insufficient documentation

## 2012-12-15 DIAGNOSIS — I10 Essential (primary) hypertension: Secondary | ICD-10-CM | POA: Insufficient documentation

## 2012-12-15 DIAGNOSIS — M549 Dorsalgia, unspecified: Secondary | ICD-10-CM | POA: Insufficient documentation

## 2012-12-15 DIAGNOSIS — Z8669 Personal history of other diseases of the nervous system and sense organs: Secondary | ICD-10-CM | POA: Insufficient documentation

## 2012-12-15 DIAGNOSIS — Z79899 Other long term (current) drug therapy: Secondary | ICD-10-CM | POA: Insufficient documentation

## 2012-12-15 DIAGNOSIS — Y939 Activity, unspecified: Secondary | ICD-10-CM | POA: Insufficient documentation

## 2012-12-15 DIAGNOSIS — Z23 Encounter for immunization: Secondary | ICD-10-CM | POA: Insufficient documentation

## 2012-12-15 DIAGNOSIS — Z87891 Personal history of nicotine dependence: Secondary | ICD-10-CM | POA: Insufficient documentation

## 2012-12-15 DIAGNOSIS — M542 Cervicalgia: Secondary | ICD-10-CM | POA: Insufficient documentation

## 2012-12-15 DIAGNOSIS — E039 Hypothyroidism, unspecified: Secondary | ICD-10-CM | POA: Insufficient documentation

## 2012-12-15 DIAGNOSIS — Z7902 Long term (current) use of antithrombotics/antiplatelets: Secondary | ICD-10-CM | POA: Insufficient documentation

## 2012-12-15 DIAGNOSIS — Z8679 Personal history of other diseases of the circulatory system: Secondary | ICD-10-CM | POA: Insufficient documentation

## 2012-12-15 DIAGNOSIS — Z8673 Personal history of transient ischemic attack (TIA), and cerebral infarction without residual deficits: Secondary | ICD-10-CM | POA: Insufficient documentation

## 2012-12-15 DIAGNOSIS — IMO0001 Reserved for inherently not codable concepts without codable children: Secondary | ICD-10-CM | POA: Insufficient documentation

## 2012-12-15 DIAGNOSIS — S61451A Open bite of right hand, initial encounter: Secondary | ICD-10-CM

## 2012-12-15 MED ORDER — DOXYCYCLINE HYCLATE 100 MG PO TABS
100.0000 mg | ORAL_TABLET | Freq: Once | ORAL | Status: AC
  Administered 2012-12-15: 100 mg via ORAL
  Filled 2012-12-15: qty 1

## 2012-12-15 MED ORDER — TRAMADOL HCL 50 MG PO TABS
50.0000 mg | ORAL_TABLET | Freq: Four times a day (QID) | ORAL | Status: DC | PRN
Start: 2012-12-15 — End: 2013-04-06

## 2012-12-15 MED ORDER — TETANUS-DIPHTH-ACELL PERTUSSIS 5-2.5-18.5 LF-MCG/0.5 IM SUSP
0.5000 mL | Freq: Once | INTRAMUSCULAR | Status: AC
  Administered 2012-12-15: 0.5 mL via INTRAMUSCULAR
  Filled 2012-12-15: qty 0.5

## 2012-12-15 MED ORDER — DOXYCYCLINE HYCLATE 100 MG PO CAPS: 100.0000 mg | ORAL_CAPSULE | Freq: Two times a day (BID) | ORAL | Status: DC

## 2012-12-15 NOTE — ED Notes (Signed)
States that she was bitten by her cat yesterday evening, states that the cat is kept indoors and vaccinations are up to date.  States she started having redness and tenderness last night in her right hand.

## 2012-12-15 NOTE — ED Provider Notes (Signed)
History  This chart was scribed for Cynthia Jakes, MD by Bennett Scrape, ED Scribe. This patient was seen in room [facility] and the patient's care was started at 7:43 AM.  CSN: [id number] Arrival date & time 12/15/12  0711  First MD Initiated Contact with Patient 12/15/12 [phone]     Chief Complaint  Patient presents with  . Animal Bite    Patient is a 77 y.o. female presenting with animal bite. The history is provided by the patient. No language interpreter was used.  Animal Bite Contact animal:  Cat Location:  Hand Hand injury location:  R hand and dorsum of R hand Pain details:    Quality:  Sharp and stinging   Severity:  Moderate   Timing:  Constant Incident location:  Home Animal's rabies vaccination status:  Up to date Tetanus status:  Out of date Worsened by:  Activity Ineffective treatments:  None tried Associated symptoms: no fever and no rash     HPI Comments: [name] is a 78 y.o. female who presents to the Emergency Department complaining of a gradually worsening animal bite with associated redness and pain. Pt states that she was swatting at her cat when the cat bite her during play around 3 PM yesterday. She states that the cat is kept inside and is UTD on vaccines. She reports that she is currently on coumadin and the wound bled vigorously before becoming hemostatically stable. She admits that she trimmed several pieces of skin off once the wound stopped bleeding, because "there was nothing to fix up".  She reports an associated sharp "stinging" pain that she rates the pain a 6 or 7 out of 10 currently. She states that the pain is aggravated by movement of the fingers. She has a h/o a natural right thumb reconstruction surgery, no hardware, 6 years ago but states that the pain with movement is a new symptom. She denies fevers as an associated symptom. Pt is a former smoker but denies alcohol use.   Past Medical History  Diagnosis Date  . CVA  (cerebral vascular accident)   . TIA (transient ischemic attack)   . HTN (hypertension)   . Asthma   . Hypothyroidism   . Hyperplastic colon polyp 2005  . Hemorrhoids 2007  . Adenomatous colon polyp 2008  . Diverticulosis   . Narcolepsy    Past Surgical History  Procedure Laterality Date  . Abdominal hysterectomy      age 35, complicated by poor wound healing, followed by revision of scar and radiation for ?malignancy  . Colonoscopy  2005    2 hyperplastic polyps  . Colonoscopy  2007    Dr. Darrick Penna- hemorrhoids  . Colonoscopy  2008    Dr. Steva Ready polyp- rare sigmoid diverticulosis, internal hemorrhoids  . Eye surgery  2010  . Breast enhancement surgery    . Eye surgery    . Right thumb surgery    . Colonoscopy  12/01/2011    Procedure: COLONOSCOPY;  Surgeon: West Bali, MD;  Location: [facility];  Service: Endoscopy;  Laterality: N/A;  12:30 PM   Family History  Problem Relation Age of Onset  . Colon cancer Father     age 71  . Prostate cancer Father   . Pancreatic cancer Mother     age 37   History  Substance Use Topics  . Smoking status: Former Smoker -- 0.25 packs/day for 1 years  . Smokeless tobacco: Not on file  . Alcohol Use:  No   No OB history provided.  Review of Systems  Constitutional: Negative for fever and chills.  HENT: Positive for neck pain (chronic, no changes). Negative for congestion and sore throat.   Eyes: Negative for visual disturbance.  Respiratory: Positive for shortness of breath (chronic from asthma ). Negative for cough.   Cardiovascular: Negative for chest pain and leg swelling.  Gastrointestinal: Negative for nausea, vomiting, abdominal pain and diarrhea.  Genitourinary: Negative for dysuria.  Musculoskeletal: Positive for back pain (chronic, no changes).  Skin: Positive for color change and wound. Negative for rash.  Neurological: Negative for headaches.  Hematological: Bruises/bleeds easily (on coumadin).   Psychiatric/Behavioral: Negative for confusion.    Allergies  Epinephrine; Demerol; and Vicodin  Home Medications   Current Outpatient Rx  Name  Route  Sig  Dispense  Refill  . Ascorbic Acid (VITAMIN C) 1000 MG tablet   Oral   Take 1,000 mg by mouth daily.         . B Complex Vitamins (VITAMIN B COMPLEX) TABS   Oral   Take 1 tablet by mouth daily.         . benzonatate (TESSALON) 200 MG capsule   Oral   Take 1 capsule (200 mg total) by mouth 3 (three) times daily as needed for cough.   30 capsule   1   . Biotin 300 MCG TABS   Oral   Take 1 tablet by mouth daily.         Marland Kitchen BLACK COHOSH PO   Oral   Take 1 capsule by mouth daily. Patient states strength is 300mg          . budesonide-formoterol (SYMBICORT) 160-4.5 MCG/ACT inhaler   Inhalation   Inhale 2 puffs into the lungs 2 (two) times daily.         Marland Kitchen CALCIUM CARBONATE PO   Oral   Take 1 tablet by mouth daily. Patient states medication is calcium 1200 mg without vitamin D.         . cholecalciferol (VITAMIN D) 1000 UNITS tablet   Oral   Take 1,000 Units by mouth daily.         . CYANOCOBALAMIN PO   Oral   Take 1 tablet by mouth daily. Patient unsure of strength         . doxycycline (VIBRAMYCIN) 100 MG capsule   Oral   Take 1 capsule (100 mg total) by mouth 2 (two) times daily.   14 capsule   0   . Ferrous Gluconate (IRON) 240 (27 FE) MG TABS   Oral   Take 1 tablet by mouth daily.         . Flaxseed, Linseed, (FLAXSEED OIL) 1000 MG CAPS   Oral   Take 1 capsule by mouth daily.          . fluconazole (DIFLUCAN) 150 MG tablet   Oral   Take 1 tablet (150 mg total) by mouth once. daily   3 tablet   0   . FLUoxetine (PROZAC) 10 MG tablet   Oral   Take 5 mg by mouth daily.         . fluticasone (FLOVENT HFA) 110 MCG/ACT inhaler   Inhalation   Inhale 1 puff into the lungs 2 (two) times daily.         . furosemide (LASIX) 40 MG tablet   Oral   Take 40 mg by mouth daily.          Marland Kitchen  Lecithin 1200 MG CAPS   Oral   Take 1 capsule by mouth daily.          Marland Kitchen levothyroxine (SYNTHROID, LEVOTHROID) 75 MCG tablet   Oral   Take 75 mcg by mouth daily before breakfast.         . LUTEIN PO   Oral   Take 1 capsule by mouth daily. Strength varies based on what is purchased at the time.         . Magnesium 250 MG TABS   Oral   Take 1 tablet by mouth daily.         . metoprolol (LOPRESSOR) 50 MG tablet   Oral   Take 50 mg by mouth 2 (two) times daily.         Marland Kitchen omeprazole (PRILOSEC) 20 MG capsule   Oral   Take 20 mg by mouth daily.         . potassium chloride (K-DUR) 10 MEQ tablet   Oral   Take 10 mEq by mouth 2 (two) times daily.         Marland Kitchen PROAIR HFA 108 (90 BASE) MCG/ACT inhaler   Inhalation   Inhale 1 puff into the lungs Once daily as needed. For shortness of breath         . promethazine-codeine (PHENERGAN WITH CODEINE) 6.25-10 MG/5ML syrup   Oral   Take 5 mLs by mouth every 4 (four) hours as needed for cough.   200 mL   0   . Spacer/Aero-Holding Chambers (AEROCHAMBER MV) inhaler      Use as instructed   1 each   0   . traMADol (ULTRAM) 50 MG tablet   Oral   Take 1 tablet (50 mg total) by mouth every 6 (six) hours as needed.   20 tablet   0   . vitamin E 400 UNIT capsule   Oral   Take 400 Units by mouth daily.         Marland Kitchen warfarin (COUMADIN) 4 MG tablet      Take 1 tablet qd except on Sundays take 6mg          . ZETIA 10 MG tablet   Oral   Take 1 tablet by mouth Daily.         . Zinc 50 MG TABS   Oral   Take 1 tablet by mouth daily.          Triage Vitals: BP 167/65  Pulse 62  Temp(Src) 98.2 F (36.8 C) (Oral)  Resp 16  Wt 140 lb (63.504 kg)  BMI 25.6 kg/m2  SpO2 99%  Physical Exam  Nursing note and vitals reviewed. Constitutional: She is oriented to person, place, and time. She appears well-developed and well-nourished. No distress.  HENT:  Head: Normocephalic and atraumatic.  Mouth/Throat:  Oropharynx is clear and moist.  Eyes: Conjunctivae and EOM are normal. Pupils are equal, round, and reactive to light.  Sclera are clear  Neck: Neck supple. No tracheal deviation present.  Cardiovascular: Normal rate and regular rhythm.   No murmur heard. Pulses:      Dorsalis pedis pulses are 2+ on the right side, and 2+ on the left side.  Pulmonary/Chest: Effort normal and breath sounds normal. No respiratory distress. She has no wheezes.  Abdominal: Soft. Bowel sounds are normal. She exhibits no distension. There is no tenderness.  Musculoskeletal: Normal range of motion. She exhibits no edema (no ankle swelling).  Lymphadenopathy:    She has no  cervical adenopathy.  Neurological: She is alert and oriented to person, place, and time. No cranial nerve deficit.  Pt able to move both sets of fingers and toes  Skin: Skin is warm and dry. No rash noted.  2 cm laceration on the dorsum of the right hand between the thumb and index finger. There is an area of erythema that extends to the base of thumb and second metacarpal that is 4 cm by 4 cm large. No swelling of the right thumb or index finger, 1 second cap refill in both, index finger and thumb have good passive ROM  Psychiatric: She has a normal mood and affect. Her behavior is normal.    ED Course  Procedures (including critical care time)  Medications  TDaP (BOOSTRIX) injection 0.5 mL (0.5 mLs Intramuscular Given 12/15/12 0805)  doxycycline (VIBRA-TABS) tablet 100 mg (100 mg Oral Given 12/15/12 0806)    DIAGNOSTIC STUDIES: Oxygen Saturation is 99% on room air, normal by my interpretation.    COORDINATION OF CARE: 7:54 AM-Discussed treatment plan which includes antibiotics, TD vaccine and xray of right hand with pt at bedside and pt agreed to plan.   Dg Hand Complete Right  12/15/2012   *RADIOLOGY REPORT*  Clinical Data: Animal bite.  RIGHT HAND - COMPLETE 3+ VIEW  Comparison: 07/17 of seven  Findings: No fracture or dislocation.   No normal trapezium is seen at the first carpal metacarpal articulation.  This is a chronic finding and may be postoperative.  The soft tissues are unremarkable.  There is no radiopaque foreign body.  IMPRESSION: No fracture or radiopaque foreign body.   Original Report Authenticated By: Amie Portland, M.D.    1. Cat bite of hand, right, initial encounter     MDM  Patient status post cat bite to her right hand yesterday evening. Some signs of early infection with redness around the bite site. Laceration will not these closed vital to let heal by secondary intention at this point in time. Does not appear to be deep x-rays of the area were negative for foreign body or any bony injury. Patient started on doxycycline he will be continued. Patient will return for recheck of the wound in 2 days. Patient given for cautious to return if the infection gets worse at all. Patient's tetanus updated here. Rabies vaccination not required at this time. Animal can be observed at home.  I personally performed the services described in this documentation, which was scribed in my presence. The recorded information has been reviewed and is accurate.     Cynthia Jakes, MD 12/15/12 [phone]

## 2013-01-14 ENCOUNTER — Ambulatory Visit: Payer: Medicare Other | Admitting: Internal Medicine

## 2013-01-20 ENCOUNTER — Other Ambulatory Visit: Payer: Self-pay

## 2013-01-20 ENCOUNTER — Encounter: Payer: Self-pay | Admitting: Internal Medicine

## 2013-01-20 ENCOUNTER — Ambulatory Visit (INDEPENDENT_AMBULATORY_CARE_PROVIDER_SITE_OTHER): Payer: Self-pay | Admitting: Internal Medicine

## 2013-01-20 VITALS — BP 118/72 | HR 48 | Ht 62.0 in | Wt 142.4 lb

## 2013-01-20 DIAGNOSIS — R05 Cough: Secondary | ICD-10-CM

## 2013-01-20 DIAGNOSIS — K219 Gastro-esophageal reflux disease without esophagitis: Secondary | ICD-10-CM

## 2013-01-20 DIAGNOSIS — R079 Chest pain, unspecified: Secondary | ICD-10-CM

## 2013-01-20 DIAGNOSIS — R059 Cough, unspecified: Secondary | ICD-10-CM

## 2013-01-20 MED ORDER — BENZONATATE 200 MG PO CAPS: 200.0000 mg | ORAL_CAPSULE | Freq: Three times a day (TID) | ORAL | Status: DC | PRN

## 2013-01-20 NOTE — Patient Instructions (Addendum)
We need to really try to reduce reflux of stomach juice into your esophagus, especially while you are sleeping. Don't eat or drink for at least an hour or two before you lie down. Try to sleep with head and chest elevated a little.  Order- Regular barium esophagram (not MBS/speech) dx esophageal reflux  Order - lab- Allergy Profile   Dx cough  Sample Dymista nasal spray   1-2 puffs each nostril once daily at bedtime  Refill script for benzonatate perles

## 2013-01-20 NOTE — Assessment & Plan Note (Signed)
Likely cough, reflux and chest discomfort are related. Uncertain if there is an allergic post nasal drip component.  Plan- allergy profile, trial of Dymista nasal spray

## 2013-01-20 NOTE — Assessment & Plan Note (Signed)
Character of pain has changed. Suspect this is part of a reflux pattern also causing her hoarseness. Plan- Reflux precautions, continue omeprazole, Barium swallow
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
## 2013-01-21 LAB — ALLERGY FULL PROFILE
Alternaria Alternata: <0.1 kU/L
Bahia Grass: <0.1 kU/L
Bermuda Grass: <0.1 kU/L
Box Elder IgE: <0.1 kU/L
Candida Albicans: <0.1 kU/L
Cat Dander: <0.1 kU/L
Curvularia lunata: <0.1 kU/L
Elm IgE: <0.1 kU/L
Fescue: <0.1 kU/L
G009 Red Top: <0.1 kU/L
Oak: <0.1 kU/L
Sycamore Tree: <0.1 kU/L
Timothy Grass: <0.1 kU/L

## 2013-01-27 ENCOUNTER — Ambulatory Visit (HOSPITAL_COMMUNITY)
Admission: RE | Admit: 2013-01-27 | Discharge: 2013-01-27 | Disposition: A | Discharge status: Home / Self Care | Payer: Medicare Other | Source: Ambulatory Visit | Attending: Internal Medicine | Admitting: Internal Medicine

## 2013-01-27 DIAGNOSIS — K219 Gastro-esophageal reflux disease without esophagitis: Secondary | ICD-10-CM

## 2013-01-27 DIAGNOSIS — R05 Cough: Secondary | ICD-10-CM | POA: Insufficient documentation

## 2013-01-27 DIAGNOSIS — R059 Cough, unspecified: Secondary | ICD-10-CM | POA: Insufficient documentation

## 2013-01-28 NOTE — Progress Notes (Signed)
Quick Note:  Pt aware of results. ______ 

## 2013-03-16 ENCOUNTER — Other Ambulatory Visit (HOSPITAL_COMMUNITY): Payer: Self-pay | Admitting: Internal Medicine

## 2013-03-16 DIAGNOSIS — M8580 Other specified disorders of bone density and structure, unspecified site: Secondary | ICD-10-CM

## 2013-03-22 ENCOUNTER — Other Ambulatory Visit (HOSPITAL_COMMUNITY): Payer: Medicare Other

## 2013-03-23 ENCOUNTER — Ambulatory Visit (HOSPITAL_COMMUNITY)
Admission: RE | Admit: 2013-03-23 | Discharge: 2013-03-23 | Disposition: A | Discharge status: Home / Self Care | Payer: Medicare Other | Source: Ambulatory Visit | Attending: Internal Medicine | Admitting: Internal Medicine

## 2013-03-23 DIAGNOSIS — M899 Disorder of bone, unspecified: Secondary | ICD-10-CM | POA: Insufficient documentation

## 2013-03-23 DIAGNOSIS — M8580 Other specified disorders of bone density and structure, unspecified site: Secondary | ICD-10-CM

## 2013-03-23 DIAGNOSIS — Z780 Asymptomatic menopausal state: Secondary | ICD-10-CM | POA: Insufficient documentation

## 2013-03-24 ENCOUNTER — Ambulatory Visit (INDEPENDENT_AMBULATORY_CARE_PROVIDER_SITE_OTHER): Payer: Medicare Other | Admitting: Internal Medicine

## 2013-03-24 ENCOUNTER — Encounter: Payer: Self-pay | Admitting: Internal Medicine

## 2013-03-24 VITALS — BP 124/78 | HR 49 | Ht 62.0 in | Wt 141.6 lb

## 2013-03-24 DIAGNOSIS — R05 Cough: Secondary | ICD-10-CM

## 2013-03-24 DIAGNOSIS — J690 Pneumonitis due to inhalation of food and vomit: Secondary | ICD-10-CM

## 2013-03-24 MED ORDER — UMECLIDINIUM-VILANTEROL 62.5-25 MCG/INH IN AEPB: 1.0000 | INHALATION_SPRAY | Freq: Every day | RESPIRATORY_TRACT | Status: DC

## 2013-03-24 NOTE — Patient Instructions (Signed)
Try replacing Symbicort with sample Anoro inhaler    1 puff, ONE time daily    When this runs out, go back to Symbicort. See if your asthma and cough do better.  Referral - consultation to [facility] for cough with aspiration
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
## 2013-04-01 ENCOUNTER — Other Ambulatory Visit: Payer: Self-pay | Admitting: Gastroenterology

## 2013-04-01 ENCOUNTER — Encounter: Payer: Self-pay | Admitting: Gastroenterology

## 2013-04-01 ENCOUNTER — Ambulatory Visit (INDEPENDENT_AMBULATORY_CARE_PROVIDER_SITE_OTHER): Payer: Medicare Other | Admitting: Gastroenterology

## 2013-04-01 VITALS — BP 141/78 | HR 52 | Wt 140.0 lb

## 2013-04-01 DIAGNOSIS — R1319 Other dysphagia: Secondary | ICD-10-CM

## 2013-04-01 NOTE — Patient Instructions (Signed)
HOLD COUMADIN TODAY.  UPPER ENDOSCOPY WITH DILATION NOV 11. NOTHING TO EAT OR DRINK AFTER 8 AM. YOU MAY HAVE A CLEAR LIQUID BREAKFAST.  WE WILL SCHEDULE A M,ODIFICED BARIUM SWALLOW AFTER YOUR ENDOSCOPY.  FOLLOW UP IN 4 MOS.

## 2013-04-01 NOTE — Progress Notes (Signed)
Subjective:    Patient ID: [name], female    DOB: 04/25/1936, 77 y.o.   MRN: [id number]  HALL, ZACH, MD  HPI DEALING WITH RESP ISSUES. SOME THINGS GO DOWN THE WRONG WAY. SHE COUGHS AND SPUTTERS. RECENTLY INHALED A PEANUT. REFERRED TO DR. YOUNG FOR COUGH AND HOARSENESS. HAD 3 CVAs. BPE-SEP 2014-?ASPIRATION & BA TABLET SLOW TO MOVE THROUGH GE JXN. NO RECENT TIAs OR CVA Sx. BALANCE IS OFF, SHE FELL THUS AM. BMs: FOR WEEKS #4 THEN ALL OF A SUDDEN IT'S A #1. MAY BE LOOSE. CHEST PAIN OFF AND ON WHEN SHE LAYS DOWN. NO WEIGHT LOSS. APPETITE IS GOOD. PT DENIES FEVER, CHILLS, BRBPR, nausea, vomiting, melena, abd pain, problems swallowing, problems with sedation, OR heartburn or indigestion.  Past Medical History  Diagnosis Date  . CVA (cerebral vascular accident)   . TIA (transient ischemic attack)   . HTN (hypertension)   . Asthma   . Hypothyroidism   . Hyperplastic colon polyp 2005  . Hemorrhoids 2007  . Adenomatous colon polyp 2008  . Diverticulosis   . Narcolepsy     Past Surgical History  Procedure Laterality Date  . Abdominal hysterectomy      age 28, complicated by poor wound healing, followed by revision of scar and radiation for ?malignancy  . Colonoscopy  2005    2 hyperplastic polyps  . Colonoscopy  2007    Dr. Fields- hemorrhoids  . Colonoscopy  2008    Dr. Fields-adenomatous polyp- rare sigmoid diverticulosis, internal hemorrhoids  . Eye surgery  2010  . Breast enhancement surgery    . Eye surgery    . Right thumb surgery    . Colonoscopy  12/01/2011    Procedure: COLONOSCOPY;  Surgeon: Sandi L Fields, MD;  [facility]: [facility];  Service: Endoscopy;  Laterality: N/A;  12:30 PM    Allergies  Allergen Reactions  . Epinephrine Anaphylaxis  . Demerol [Meperidine] Other (See Comments)    headaches  . Vicodin [Hydrocodone-Acetaminophen] Other (See Comments)    Said it makes her crazy    Current Outpatient Prescriptions  Medication Sig Dispense Refill  .  Ascorbic Acid (VITAMIN C) 1000 MG tablet Take 1,000 mg by mouth daily.      . B Complex Vitamins (VITAMIN B COMPLEX) TABS Take 1 tablet by mouth daily.      . benzonatate (TESSALON) 200 MG capsule Take 1 capsule (200 mg total) by mouth 3 (three) times daily as needed for cough.  30 capsule  1  . Biotin 300 MCG TABS Take 1 tablet by mouth daily.      . BLACK COHOSH PO Take 1 capsule by mouth daily. Patient states strength is 300mg      . budesonide-formoterol (SYMBICORT) 160-4.5 MCG/ACT inhaler Inhale 2 puffs into the lungs 2 (two) times daily.      . CALCIUM CARBONATE PO Take 1 tablet by mouth daily. Patient states medication is calcium 1200 mg without vitamin D.      . cholecalciferol (VITAMIN D) 1000 UNITS tablet Take 1,000 Units by mouth daily.      . CYANOCOBALAMIN PO Take 1 tablet by mouth daily. Patient unsure of strength      . Ferrous Gluconate (IRON) 240 (27 FE) MG TABS Take 1 tablet by mouth daily.      . Flaxseed, Linseed, (FLAXSEED OIL) 1000 MG CAPS Take 1 capsule by mouth daily.       . FLUoxetine (PROZAC) 10 MG tablet Take 5 mg   by mouth daily.      . fluticasone (FLOVENT HFA) 110 MCG/ACT inhaler Inhale 1 puff into the lungs 2 (two) times daily.      . furosemide (LASIX) 40 MG tablet Take 40 mg by mouth daily.      . Lecithin 1200 MG CAPS Take 1 capsule by mouth daily.       . levothyroxine (SYNTHROID, LEVOTHROID) 75 MCG tablet Take 75 mcg by mouth daily before breakfast.      . LUTEIN PO Take 1 capsule by mouth daily. Strength varies based on what is purchased at the time.      . Magnesium 250 MG TABS Take 1 tablet by mouth daily.      . metoprolol (LOPRESSOR) 50 MG tablet Take 50 mg by mouth 2 (two) times daily.      . omeprazole (PRILOSEC) 20 MG capsule Take 20 mg by mouth daily.      . potassium chloride (K-DUR) 10 MEQ tablet Take 10 mEq by mouth 2 (two) times daily.      . PROAIR HFA 108 (90 BASE) MCG/ACT inhaler Inhale 1 puff into the lungs Once daily as needed. For shortness  of breath      . Spacer/Aero-Holding Chambers (AEROCHAMBER MV) inhaler Use as instructed  1 each  0  . vitamin E 400 UNIT capsule Take 400 Units by mouth daily.      . warfarin (COUMADIN) 4 MG tablet Take 1 tablet qd except on Sundays take 6mg      . ZETIA 10 MG tablet Take 1 tablet by mouth Daily.      . Zinc 50 MG TABS Take 1 tablet by mouth daily.      . traMADol (ULTRAM) 50 MG tablet Take 1 tablet (50 mg total) by mouth every 6 (six) hours as needed.  20 tablet  0  . Umeclidinium-Vilanterol (ANORO ELLIPTA) 62.5-25 MCG/INH AEPB Inhale 1 puff into the lungs daily.  1 each  0      Review of Systems     Objective:   Physical Exam  Vitals reviewed. Constitutional: She is oriented to person, place, and time. She appears well-nourished. No distress.  HENT:  Head: Normocephalic and atraumatic.  Mouth/Throat: No oropharyngeal exudate.  Eyes: Pupils are equal, round, and reactive to light. No scleral icterus.  Neck: Normal range of motion. Neck supple.  Cardiovascular: Normal rate, regular rhythm and normal heart sounds.   Pulmonary/Chest: Effort normal and breath sounds normal. No respiratory distress.  Abdominal: Soft. Bowel sounds are normal. She exhibits no distension. There is no tenderness.  Musculoskeletal: Normal range of motion. She exhibits no edema.  Lymphadenopathy:    She has no cervical adenopathy.  Neurological: She is alert and oriented to person, place, and time.  NO  NEW FOCAL DEFICITS   Psychiatric: She has a normal mood and affect.          Assessment & Plan:   

## 2013-04-01 NOTE — Assessment & Plan Note (Signed)
Not associated with weight loss in pt with hx: CVA AND BPE SUGGESTING UES DYSFUNCTION AND ? STRICTURE AT GE JXN.  EGD/DIL NOV 11 MBS AFTER EGD CONSIDER ENT REFERRAL FOR BOTOX OPV IN 4 MOS

## 2013-04-04 NOTE — Progress Notes (Signed)
cc'd to pcp 

## 2013-04-06 ENCOUNTER — Encounter (HOSPITAL_COMMUNITY): Payer: Self-pay | Admitting: Pharmacy Technician

## 2013-04-10 NOTE — Assessment & Plan Note (Addendum)
Barium swallow did not clearly demonstrate reflux or aspiration risk, but it is not ruled out. Plan-she will see Dr. Lisbeth Renshaw, her gastroenterologist Explore possible aspiration pneumonitis/bronchitis as a basis for her cough-replace Symbicort with a trial of Anoro. Consider possible need for bronchoscopy to look for foreign material and inflammation in the airways consistent with aspiration.

## 2013-04-15 ENCOUNTER — Ambulatory Visit (HOSPITAL_COMMUNITY)
Admission: RE | Admit: 2013-04-15 | Discharge: 2013-04-15 | Disposition: A | Discharge status: Home / Self Care | Payer: Medicare Other | Source: Ambulatory Visit | Attending: Gastroenterology | Admitting: Gastroenterology

## 2013-04-15 ENCOUNTER — Encounter (HOSPITAL_COMMUNITY)
Admission: RE | Disposition: A | Discharge status: Home / Self Care | Payer: Self-pay | Source: Ambulatory Visit | Attending: Gastroenterology

## 2013-04-15 ENCOUNTER — Encounter (HOSPITAL_COMMUNITY): Payer: Self-pay | Admitting: *Deleted

## 2013-04-15 DIAGNOSIS — R1310 Dysphagia, unspecified: Secondary | ICD-10-CM

## 2013-04-15 DIAGNOSIS — K2940 Chronic atrophic gastritis without bleeding: Secondary | ICD-10-CM | POA: Insufficient documentation

## 2013-04-15 DIAGNOSIS — R1319 Other dysphagia: Secondary | ICD-10-CM

## 2013-04-15 DIAGNOSIS — K2970 Gastritis, unspecified, without bleeding: Secondary | ICD-10-CM

## 2013-04-15 DIAGNOSIS — K2990 Gastroduodenitis, unspecified, without bleeding: Secondary | ICD-10-CM

## 2013-04-15 DIAGNOSIS — I10 Essential (primary) hypertension: Secondary | ICD-10-CM | POA: Insufficient documentation

## 2013-04-15 DIAGNOSIS — K2980 Duodenitis without bleeding: Secondary | ICD-10-CM

## 2013-04-15 HISTORY — PX: ESOPHAGOGASTRODUODENOSCOPY (EGD) WITH ESOPHAGEAL DILATION: SHX5812

## 2013-04-15 SURGERY — ESOPHAGOGASTRODUODENOSCOPY (EGD) WITH ESOPHAGEAL DILATION
Anesthesia: Moderate Sedation

## 2013-04-15 MED ORDER — BUTAMBEN-TETRACAINE-BENZOCAINE 2-2-14 % EX AERO
INHALATION_SPRAY | CUTANEOUS | Status: DC | PRN
  Administered 2013-04-15: 2 via TOPICAL

## 2013-04-15 MED ORDER — FENTANYL CITRATE 0.05 MG/ML IJ SOLN
INTRAMUSCULAR | Status: DC | PRN
  Administered 2013-04-15 (×2): 25 ug via INTRAVENOUS

## 2013-04-15 MED ORDER — MIDAZOLAM HCL 5 MG/5ML IJ SOLN
INTRAMUSCULAR | Status: AC
  Filled 2013-04-15: qty 10

## 2013-04-15 MED ORDER — FENTANYL CITRATE 0.05 MG/ML IJ SOLN
INTRAMUSCULAR | Status: AC
  Filled 2013-04-15: qty 2

## 2013-04-15 MED ORDER — MIDAZOLAM HCL 5 MG/5ML IJ SOLN
INTRAMUSCULAR | Status: DC | PRN
  Administered 2013-04-15: 2 mg via INTRAVENOUS
  Administered 2013-04-15: 1 mg via INTRAVENOUS

## 2013-04-15 MED ORDER — MINERAL OIL PO OIL
TOPICAL_OIL | ORAL | Status: AC
  Filled 2013-04-15: qty 30

## 2013-04-15 MED ORDER — STERILE WATER FOR IRRIGATION IR SOLN
Status: DC | PRN
  Administered 2013-04-15: 12:00:00

## 2013-04-15 MED ORDER — SODIUM CHLORIDE 0.9 % IV SOLN
INTRAVENOUS | Status: DC
  Administered 2013-04-15: 12:00:00 via INTRAVENOUS

## 2013-04-15 MED ORDER — MEPERIDINE HCL 100 MG/ML IJ SOLN: INTRAMUSCULAR | Status: DC | PRN

## 2013-04-15 MED ORDER — MEPERIDINE HCL 100 MG/ML IJ SOLN
INTRAMUSCULAR | Status: AC
  Filled 2013-04-15: qty 2

## 2013-04-15 NOTE — Op Note (Signed)
[facility] [address] [place], [address]   ENDOSCOPY PROCEDURE REPORT  PATIENT: [name], [name]  MR#: [id number] BIRTHDATE: 04/09/1936 , 77  yrs. old GENDER: Female  ENDOSCOPIST: [name], MD REFFERED XB:JYNW Hall, M.D.  PROCEDURE DATE:  04/15/2013 PROCEDURE:   EGD with biopsy and EGD with dilatation over guidewire   INDICATIONS:1.  dysphagia. MEDICATIONS: Demerol 50 mg IV and Versed 3 mg IV TOPICAL ANESTHETIC: Cetacaine Spray  DESCRIPTION OF PROCEDURE:   After the risks benefits and alternatives of the procedure were thoroughly explained, informed consent was obtained.  The EG-[id number] ([id number])  endoscope was introduced through the mouth and advanced to the second portion of the duodenum. The instrument was slowly withdrawn as the mucosa was carefully examined.  Prior to withdrawal of the scope, the guidwire was placed.  The esophagus was dilated successfully.  The patient was recovered in endoscopy and discharged home in satisfactory condition.   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  OTHERWISE NL ESOPHAGUS.   STOMACH: Moderate non-erosive gastritis (inflammation) was found in the gastric antrum.  Multiple biopsies were performed. DUODENUM: Mild duodenal inflammation was found in the duodenal bulb.   The duodenal mucosa showed no abnormalities in the 2nd part of the duodenum.   Dilation was then performed at the proximal esophagus Dilator: Savary over guidewire Size(s): 12.8-16 MM  COMPLICATIONS: There were no complications.   ENDOSCOPIC IMPRESSION: 1.   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  . 2.   MODERATER Non-erosive gastritis  AND MILD DUOEDENITIS  RECOMMENDATIONS: MODIFIED BARIUM SWALLOW IN THE NEXT 2 WEEKS. START COUMADIN NOV 25. CONTINUE OMEPRAZOLE.  TAKE 30 MINUTES PRIOR TO YOUR FIRST MEAL. FOLLOW A LOW FAT DIET. BIOPSY WILL BE BACK IN 7 DAYS.  FOLLOW UP IN [facility] 2015.      _______________________________ eSignedJonette Eva, MD 04/15/2013 2:55 PM      PATIENT NAME:  [name], [name] MR#: [id number]

## 2013-04-15 NOTE — Interval H&P Note (Signed)
History and Physical Interval Note:  04/15/2013 11:14 AM  [name]  has presented today for surgery, with the diagnosis of DYSPHAGIA  The various methods of treatment have been discussed with the patient and family. After consideration of risks, benefits and other options for treatment, the patient has consented to  Procedure(s) with comments: ESOPHAGOGASTRODUODENOSCOPY (EGD) WITH ESOPHAGEAL DILATION (N/A) - 11:45-moved to 12:30 [name] to notify pt as a surgical intervention .  The patient's history has been reviewed, patient examined, no change in status, stable for surgery.  I have reviewed the patient's chart and labs.  Questions were answered to the patient's satisfaction.     [employer]

## 2013-04-15 NOTE — H&P (View-Only) (Signed)
Subjective:    Patient ID: [name], female    DOB: 23-Feb-1936, 77 y.o.   MRN: [id number]  Catalina Pizza, MD  HPI DEALING WITH RESP ISSUES. SOME THINGS GO DOWN THE WRONG WAY. SHE COUGHS AND SPUTTERS. RECENTLY INHALED A PEANUT. REFERRED TO DR. Maple Hudson FOR COUGH AND HOARSENESS. HAD 3 CVAs. BPE-SEP 2014-?ASPIRATION & BA TABLET SLOW TO MOVE THROUGH GE JXN. NO RECENT TIAs OR CVA Sx. BALANCE IS OFF, SHE FELL THUS AM. BMs: FOR WEEKS #4 THEN ALL OF A SUDDEN IT'S A #1. MAY BE LOOSE. CHEST PAIN OFF AND ON WHEN SHE LAYS DOWN. NO WEIGHT LOSS. APPETITE IS GOOD. PT DENIES FEVER, CHILLS, BRBPR, nausea, vomiting, melena, abd pain, problems swallowing, problems with sedation, OR heartburn or indigestion.  Past Medical History  Diagnosis Date  . CVA (cerebral vascular accident)   . TIA (transient ischemic attack)   . HTN (hypertension)   . Asthma   . Hypothyroidism   . Hyperplastic colon polyp 2005  . Hemorrhoids 2007  . Adenomatous colon polyp 2008  . Diverticulosis   . Narcolepsy     Past Surgical History  Procedure Laterality Date  . Abdominal hysterectomy      age 8, complicated by poor wound healing, followed by revision of scar and radiation for ?malignancy  . Colonoscopy  2005    2 hyperplastic polyps  . Colonoscopy  2007    Dr. Darrick Penna- hemorrhoids  . Colonoscopy  2008    Dr. Steva Ready polyp- rare sigmoid diverticulosis, internal hemorrhoids  . Eye surgery  2010  . Breast enhancement surgery    . Eye surgery    . Right thumb surgery    . Colonoscopy  12/01/2011    Procedure: COLONOSCOPY;  Surgeon: West Bali, MD;  Location: [facility];  Service: Endoscopy;  Laterality: N/A;  12:30 PM    Allergies  Allergen Reactions  . Epinephrine Anaphylaxis  . Demerol [Meperidine] Other (See Comments)    headaches  . Vicodin [Hydrocodone-Acetaminophen] Other (See Comments)    York Spaniel it makes her crazy    Current Outpatient Prescriptions  Medication Sig Dispense Refill  .  Ascorbic Acid (VITAMIN C) 1000 MG tablet Take 1,000 mg by mouth daily.      . B Complex Vitamins (VITAMIN B COMPLEX) TABS Take 1 tablet by mouth daily.      . benzonatate (TESSALON) 200 MG capsule Take 1 capsule (200 mg total) by mouth 3 (three) times daily as needed for cough.  30 capsule  1  . Biotin 300 MCG TABS Take 1 tablet by mouth daily.      Marland Kitchen BLACK COHOSH PO Take 1 capsule by mouth daily. Patient states strength is 300mg       . budesonide-formoterol (SYMBICORT) 160-4.5 MCG/ACT inhaler Inhale 2 puffs into the lungs 2 (two) times daily.      Marland Kitchen CALCIUM CARBONATE PO Take 1 tablet by mouth daily. Patient states medication is calcium 1200 mg without vitamin D.      . cholecalciferol (VITAMIN D) 1000 UNITS tablet Take 1,000 Units by mouth daily.      . CYANOCOBALAMIN PO Take 1 tablet by mouth daily. Patient unsure of strength      . Ferrous Gluconate (IRON) 240 (27 FE) MG TABS Take 1 tablet by mouth daily.      . Flaxseed, Linseed, (FLAXSEED OIL) 1000 MG CAPS Take 1 capsule by mouth daily.       Marland Kitchen FLUoxetine (PROZAC) 10 MG tablet Take 5 mg  by mouth daily.      . fluticasone (FLOVENT HFA) 110 MCG/ACT inhaler Inhale 1 puff into the lungs 2 (two) times daily.      . furosemide (LASIX) 40 MG tablet Take 40 mg by mouth daily.      . Lecithin 1200 MG CAPS Take 1 capsule by mouth daily.       Marland Kitchen levothyroxine (SYNTHROID, LEVOTHROID) 75 MCG tablet Take 75 mcg by mouth daily before breakfast.      . LUTEIN PO Take 1 capsule by mouth daily. Strength varies based on what is purchased at the time.      . Magnesium 250 MG TABS Take 1 tablet by mouth daily.      . metoprolol (LOPRESSOR) 50 MG tablet Take 50 mg by mouth 2 (two) times daily.      Marland Kitchen omeprazole (PRILOSEC) 20 MG capsule Take 20 mg by mouth daily.      . potassium chloride (K-DUR) 10 MEQ tablet Take 10 mEq by mouth 2 (two) times daily.      Marland Kitchen PROAIR HFA 108 (90 BASE) MCG/ACT inhaler Inhale 1 puff into the lungs Once daily as needed. For shortness  of breath      . Spacer/Aero-Holding Chambers (AEROCHAMBER MV) inhaler Use as instructed  1 each  0  . vitamin E 400 UNIT capsule Take 400 Units by mouth daily.      Marland Kitchen warfarin (COUMADIN) 4 MG tablet Take 1 tablet qd except on Sundays take 6mg       . ZETIA 10 MG tablet Take 1 tablet by mouth Daily.      . Zinc 50 MG TABS Take 1 tablet by mouth daily.      . traMADol (ULTRAM) 50 MG tablet Take 1 tablet (50 mg total) by mouth every 6 (six) hours as needed.  20 tablet  0  . Umeclidinium-Vilanterol (ANORO ELLIPTA) 62.5-25 MCG/INH AEPB Inhale 1 puff into the lungs daily.  1 each  0      Review of Systems     Objective:   Physical Exam  Vitals reviewed. Constitutional: She is oriented to person, place, and time. She appears well-nourished. No distress.  HENT:  Head: Normocephalic and atraumatic.  Mouth/Throat: No oropharyngeal exudate.  Eyes: Pupils are equal, round, and reactive to light. No scleral icterus.  Neck: Normal range of motion. Neck supple.  Cardiovascular: Normal rate, regular rhythm and normal heart sounds.   Pulmonary/Chest: Effort normal and breath sounds normal. No respiratory distress.  Abdominal: Soft. Bowel sounds are normal. She exhibits no distension. There is no tenderness.  Musculoskeletal: Normal range of motion. She exhibits no edema.  Lymphadenopathy:    She has no cervical adenopathy.  Neurological: She is alert and oriented to person, place, and time.  NO  NEW FOCAL DEFICITS   Psychiatric: She has a normal mood and affect.          Assessment & Plan:

## 2013-04-18 ENCOUNTER — Other Ambulatory Visit: Payer: Self-pay | Admitting: Gastroenterology

## 2013-04-18 DIAGNOSIS — R1319 Other dysphagia: Secondary | ICD-10-CM

## 2013-04-19 ENCOUNTER — Telehealth: Payer: Self-pay | Admitting: Gastroenterology

## 2013-04-19 NOTE — Telephone Encounter (Signed)
Please call pt. HER stomach Bx shows gastritis.   MODIFIED BARIUM SWALLOW IN THE NEXT WEEK. START COUMADIN NOV 25.  CONTINUE OMEPRAZOLE. TAKE 30 MINUTES PRIOR TO YOUR FIRST MEAL. FOLLOW A LOW FAT DIET.  FOLLOW UP IN MAR 2015.

## 2013-04-19 NOTE — Telephone Encounter (Signed)
Referral was sent to speech pathology

## 2013-04-19 NOTE — Telephone Encounter (Signed)
Called and informed pt.  

## 2013-04-19 NOTE — Telephone Encounter (Signed)
Results Cc to PCP  

## 2013-04-20 ENCOUNTER — Encounter (HOSPITAL_COMMUNITY): Payer: Self-pay | Admitting: Gastroenterology

## 2013-04-25 ENCOUNTER — Encounter: Payer: Self-pay | Admitting: Internal Medicine

## 2013-04-25 ENCOUNTER — Ambulatory Visit (INDEPENDENT_AMBULATORY_CARE_PROVIDER_SITE_OTHER): Payer: Medicare Other | Admitting: Internal Medicine

## 2013-04-25 ENCOUNTER — Encounter (INDEPENDENT_AMBULATORY_CARE_PROVIDER_SITE_OTHER): Payer: Self-pay

## 2013-04-25 ENCOUNTER — Ambulatory Visit (INDEPENDENT_AMBULATORY_CARE_PROVIDER_SITE_OTHER)
Admission: RE | Admit: 2013-04-25 | Discharge: 2013-04-25 | Disposition: A | Discharge status: Home / Self Care | Payer: Medicare Other | Source: Ambulatory Visit | Attending: Internal Medicine | Admitting: Internal Medicine

## 2013-04-25 VITALS — BP 120/74 | HR 52 | Ht 62.0 in | Wt 143.0 lb

## 2013-04-25 DIAGNOSIS — R05 Cough: Secondary | ICD-10-CM

## 2013-04-25 NOTE — Patient Instructions (Signed)
Sample Dulera 200   2 puffs then rinse mouth, twice daily  You can use your tramadol as a cough suppressant if the perles aren't enough - try tramadol 50 mg, 1 orf 2 tabs every 8 hours if needed for cough  Order CXR   Dx chronic cough
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
## 2013-05-02 ENCOUNTER — Telehealth: Payer: Self-pay | Admitting: Gastroenterology

## 2013-05-02 NOTE — Telephone Encounter (Signed)
I am waiting for [facility] from Speech Pathology to return my call

## 2013-05-02 NOTE — Telephone Encounter (Signed)
Pt called to check on scheduling her modified barium pill. She said that after her procedure on 11/21 that SF wanted her to be scheduled for this in 2 weeks. Patient said she hasn't heard anything from [place] and was following up. Please advise. [phone]

## 2013-05-03 NOTE — Telephone Encounter (Signed)
I spoke to [facility] with Speech Pathology and she stated she would call the patient now to schedule

## 2013-05-03 NOTE — Telephone Encounter (Signed)
Referral has been sitting in Speech Pathologys workque since Nov

## 2013-05-05 ENCOUNTER — Other Ambulatory Visit: Payer: Self-pay | Admitting: Gastroenterology

## 2013-05-05 DIAGNOSIS — R1319 Other dysphagia: Secondary | ICD-10-CM

## 2013-05-10 ENCOUNTER — Ambulatory Visit (HOSPITAL_COMMUNITY)
Admission: RE | Admit: 2013-05-10 | Discharge: 2013-05-10 | Disposition: A | Discharge status: Home / Self Care | Payer: Medicare Other | Source: Ambulatory Visit | Attending: Gastroenterology | Admitting: Gastroenterology

## 2013-05-10 DIAGNOSIS — R1319 Other dysphagia: Secondary | ICD-10-CM | POA: Insufficient documentation

## 2013-05-10 DIAGNOSIS — IMO0001 Reserved for inherently not codable concepts without codable children: Secondary | ICD-10-CM | POA: Insufficient documentation

## 2013-05-10 DIAGNOSIS — R1310 Dysphagia, unspecified: Secondary | ICD-10-CM | POA: Insufficient documentation

## 2013-05-10 NOTE — Procedures (Signed)
Objective Swallowing Evaluation: Modified Barium Swallowing Study   Patient Details  Name: [name] MRN: [id number] Date of Birth: [birth year]  Today's Date: 05/10/2013 Time: 2:30 PM - 3:06 PM    Past Medical History:  Past Medical History  Diagnosis Date  . CVA (cerebral vascular accident)   . TIA (transient ischemic attack)   . HTN (hypertension)   . Asthma   . Hypothyroidism   . Hyperplastic colon polyp 2005  . Hemorrhoids 2007  . Adenomatous colon polyp 2008  . Diverticulosis   . Narcolepsy    Past Surgical History:  Past Surgical History  Procedure Laterality Date  . Abdominal hysterectomy      age 77, complicated by poor wound healing, followed by revision of scar and radiation for ?malignancy  . Colonoscopy  2005    2 hyperplastic polyps  . Colonoscopy  2007    Dr. Darrick Penna- hemorrhoids  . Colonoscopy  2008    Dr. Steva Ready polyp- rare sigmoid diverticulosis, internal hemorrhoids  . Eye surgery  2010  . Breast enhancement surgery    . Eye surgery    . Right thumb surgery    . Colonoscopy  12/01/2011    Procedure: COLONOSCOPY;  Surgeon: West Bali, MD;  Location: [facility];  Service: Endoscopy;  Laterality: N/A;  12:30 PM  . Esophagogastroduodenoscopy (egd) with esophageal dilation N/A 04/15/2013    Procedure: ESOPHAGOGASTRODUODENOSCOPY (EGD) WITH ESOPHAGEAL DILATION;  Surgeon: West Bali, MD;  Location: [facility];  Service: Endoscopy;  Laterality: N/A;  11:45-moved to 12:30 [name] to notify pt   HPI:  [name] is a 77 year old female who was referred by Dr Darrick Penna for [facility] due to pt complaints of choking and coughing since last spring. Pt is followed by Dr. Maple Hudson (Pulmonology). She had a barium pill esophagram completed in September at [facility] which showed no penetration or aspiration, however cough was elicited when pt drank water so there is some question of aspiration. Pt was dilated by Dr. 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 The pt reports to me that there seems to be no rhyme or reason to choking episodes, but that it started after URI last February. Pt has asthma.  Symptoms/Limitations Symptoms: "I get choked on my own saliva... or crumbs." Special Tests: MBSS  Recommendation/Prognosis  Clinical Impression Dysphagia Diagnosis: Mild oral phase dysphagia;Mild pharyngeal phase dysphagia Clinical impression: Oropharyngeal swallow is essentially normal to mild impairment. Pt required cues to fully masticate solids prior to swallowing and even on subsequent trials, mastication was incomplete. Pt had one episode of flash penetration during straw sips of thin liquid and NO aspiration was witnessed. Pt had the most difficulty coordinating pill and thin liquid. Pt had difficulty transferring pill to posterior oral cavity and threw her head back several times while taking mulitple sips. Pt had strong cough after the pill entered the esophagus, however no aspiration was witnessed.  SLP spoke to pt at length after the study to review the results and attempt to answer pt's questions/concerns. Pt relayed stories of coughing up bits of peanuts and crumbs well after having eaten them. SLP suggested that pt try a mechanical soft diet (avoid nuts, dry flaky crunchy foods), however pt stated that she was not going to do that and that this test was a waste of her time. I explained that this test only assesses her swallow function at that moment and perhaps she was having episodes of  penetration/aspiration at times and to try chewing foods thoroughly, eating soft diet, and going slowly. Pt was not interested in suggestions.   Swallow Evaluation Recommendations Diet Recommendations: Dysphagia 3 (Mechanical Soft);Thin liquid Liquid Administration via: Cup;Straw Medication Administration:  (Suggested trying pills whole in puree or thicker drinks) Supervision: Patient able to self feed Compensations:  Small sips/bites Postural Changes and/or Swallow Maneuvers: Out of bed for meals;Seated upright 90 degrees;Upright 30-60 min after meal Oral Care Recommendations: Oral care BID Other Recommendations: Clarify dietary restrictions Follow up Recommendations: None Prognosis Prognosis for Safe Diet Advancement: Fair Barriers to Reach Goals: Behavior Individuals Consulted Consulted and Agree with Results and Recommendations: Patient Report Sent to : Referring physician  SLP Assessment/Plan   General:  Date of Onset: 04/15/13 HPI: [name] is a 77 year old female who was referred by Dr Darrick Penna for [facility] due to pt complaints of choking and coughing since last spring. Pt is followed by Dr. Maple Hudson (Pulmonology). She had a barium pill esophagram completed in September at [facility] which showed no penetration or aspiration, however cough was elicited when pt drank water so there is some question of aspiration. Pt was dilated by Dr. 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 The pt reports to me that there seems to be no rhyme or reason to choking episodes, but that it started after URI last February. Pt has asthma. Type of Study: Modified Barium Swallowing Study Reason for Referral: Objectively evaluate swallowing function Diet Prior to this Study: Regular;Thin liquids Temperature Spikes Noted: No Respiratory Status: Room air Behavior/Cognition: Alert Oral Cavity - Dentition: Adequate natural dentition Oral Motor / Sensory Function: Within functional limits Self-Feeding Abilities: Able to feed self Patient Positioning: Upright in chair Baseline Vocal Quality: Clear;Hoarse Volitional Cough: Strong Volitional Swallow: Able to elicit Anatomy: Within functional limits Pharyngeal Secretions: Not observed secondary MBS  Reason for Referral:  Objectively evaluate swallowing function   Oral Phase Oral Preparation/Oral Phase Oral Phase: Impaired Oral -  Solids Oral - Regular: Impaired mastication (rapid oral transit with incomplete mastication suspected) Oral - Pill: Reduced posterior propulsion;Delayed oral transit (Pt throws head back to try to swallow pill with water) Pharyngeal Phase  Pharyngeal - Thin Pharyngeal - Thin Straw: Penetration/Aspiration during swallow;Delayed swallow initiation Penetration/Aspiration details (thin straw): Material enters airway, remains ABOVE vocal cords then ejected out;Material does not enter airway Pharyngeal - Solids Pharyngeal - Pill:  (Pt penetrated trace amount of thin when taking pill) Pharyngeal Phase - Comment Pharyngeal Comment: See summary Cervical Esophageal Phase  Cervical Esophageal Phase Cervical Esophageal Phase: Impaired Cervical Esophageal Phase - Solids Puree: Prominent cricopharyngeal segment Cervical Esophageal Phase - Comment Cervical Esophageal Comment: Prominent cricopharyngeus noted with each swallow   G-Codes Functional Assessment Tool Used: MBSS Functional Limitations: Swallowing Swallow Current Status ([id number]): At least 1 percent but less than 20 percent impaired, limited or restricted Swallow Goal Status [id number]): At least 1 percent but less than 20 percent impaired, limited or restricted Swallow Discharge Status [phone]): At least 1 percent but less than 20 percent impaired, limited or restricted  Thank you,  Havery Moros, CCC-SLP [phone]  PORTER,DABNEY 05/10/2013, 10:17 PM

## 2013-05-11 NOTE — Plan of Care (Signed)
REVIEWED MBS report.

## 2013-05-12 ENCOUNTER — Ambulatory Visit: Payer: Medicare Other | Admitting: Internal Medicine

## 2013-05-13 NOTE — Assessment & Plan Note (Signed)
Cyclical cough with probable recurrent aspiration. Now being evaluated by GI Plan-use Dulera 200  with spacer, instead of Symbicort

## 2013-06-27 ENCOUNTER — Encounter (INDEPENDENT_AMBULATORY_CARE_PROVIDER_SITE_OTHER): Payer: Self-pay

## 2013-06-27 ENCOUNTER — Ambulatory Visit (INDEPENDENT_AMBULATORY_CARE_PROVIDER_SITE_OTHER): Payer: Medicare Other | Admitting: Internal Medicine

## 2013-06-27 ENCOUNTER — Ambulatory Visit (INDEPENDENT_AMBULATORY_CARE_PROVIDER_SITE_OTHER)
Admission: RE | Admit: 2013-06-27 | Discharge: 2013-06-27 | Disposition: A | Discharge status: Home / Self Care | Payer: Medicare Other | Source: Ambulatory Visit | Attending: Internal Medicine | Admitting: Internal Medicine

## 2013-06-27 ENCOUNTER — Encounter: Payer: Self-pay | Admitting: Internal Medicine

## 2013-06-27 VITALS — BP 130/80 | HR 57 | Ht 62.0 in | Wt 138.8 lb

## 2013-06-27 DIAGNOSIS — R079 Chest pain, unspecified: Secondary | ICD-10-CM

## 2013-06-27 DIAGNOSIS — R059 Cough, unspecified: Secondary | ICD-10-CM

## 2013-06-27 DIAGNOSIS — J45909 Unspecified asthma, uncomplicated: Secondary | ICD-10-CM

## 2013-06-27 DIAGNOSIS — R05 Cough: Secondary | ICD-10-CM

## 2013-06-27 MED ORDER — ALBUTEROL SULFATE (2.5 MG/3ML) 0.083% IN NEBU: 2.5000 mg | INHALATION_SOLUTION | Freq: Four times a day (QID) | RESPIRATORY_TRACT | Status: DC | PRN

## 2013-06-27 MED ORDER — COMPRESSOR/NEBULIZER MISC: Status: DC
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
## 2013-07-21 NOTE — Assessment & Plan Note (Signed)
Not clear if this is chest wall pain

## 2013-07-21 NOTE — Assessment & Plan Note (Signed)
High probability recurrent aspiration contributing to chronic bronchitis. No history of flu this winter which would be an additional cause for bronchitis.  Plan-chest x-ray , nebulizer Xopenex treatment now , home nebulizer/albuterol through a DME company

## 2013-07-22 ENCOUNTER — Telehealth: Payer: Self-pay | Admitting: Internal Medicine

## 2013-07-22 NOTE — Telephone Encounter (Signed)
Called and spoke w/ pt. She reports she needs a bill from [place] for her insurance. I advised her she will need to call billing in regards to this. I gave her phone # for this. Nothing further needed

## 2013-08-22 ENCOUNTER — Encounter: Payer: Self-pay | Admitting: *Deleted

## 2013-08-22 ENCOUNTER — Ambulatory Visit (INDEPENDENT_AMBULATORY_CARE_PROVIDER_SITE_OTHER): Payer: Medicare Other | Admitting: Cardiovascular Disease

## 2013-08-22 ENCOUNTER — Encounter: Payer: Self-pay | Admitting: Cardiovascular Disease

## 2013-08-22 VITALS — BP 169/83 | HR 53 | Ht 60.0 in | Wt 137.0 lb

## 2013-08-22 DIAGNOSIS — R002 Palpitations: Secondary | ICD-10-CM

## 2013-08-22 DIAGNOSIS — I10 Essential (primary) hypertension: Secondary | ICD-10-CM

## 2013-08-22 DIAGNOSIS — R000 Tachycardia, unspecified: Secondary | ICD-10-CM

## 2013-08-22 DIAGNOSIS — R079 Chest pain, unspecified: Secondary | ICD-10-CM

## 2013-08-22 MED ORDER — AMLODIPINE BESYLATE 5 MG PO TABS: 5.0000 mg | ORAL_TABLET | Freq: Every day | ORAL | Status: DC

## 2013-08-22 NOTE — Patient Instructions (Signed)
   Begin Norvasc 5mg  daily - new sent to pharm Continue all other medications.   Your physician has recommended that you wear a 2 week event monitor. Event monitors are medical devices that record the heart's electrical activity. Doctors most often [place] these monitors to diagnose arrhythmias. Arrhythmias are problems with the speed or rhythm of the heartbeat. The monitor is a small, portable device. You can wear one while you do your normal daily activities. This is usually used to diagnose what is causing palpitations/syncope (passing out). Your physician has requested that you have an echocardiogram. Echocardiography is a painless test that uses sound waves to create images of your heart. It provides your doctor with information about the size and shape of your heart and how well your heart's chambers and valves are working. This procedure takes approximately one hour. There are no restrictions for this procedure. Your physician has requested that you have a lexiscan myoview. For further information please visit [email or web address]. Please follow instruction sheet, as given. Office will contact with results via phone or letter.   Follow up in  6 weeks
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
## 2013-08-25 ENCOUNTER — Other Ambulatory Visit: Payer: Self-pay | Admitting: *Deleted

## 2013-08-25 ENCOUNTER — Ambulatory Visit (INDEPENDENT_AMBULATORY_CARE_PROVIDER_SITE_OTHER): Payer: Medicare Other | Admitting: Internal Medicine

## 2013-08-25 ENCOUNTER — Encounter (INDEPENDENT_AMBULATORY_CARE_PROVIDER_SITE_OTHER): Payer: Self-pay

## 2013-08-25 ENCOUNTER — Encounter: Payer: Self-pay | Admitting: Internal Medicine

## 2013-08-25 VITALS — BP 140/80 | HR 58 | Ht 62.0 in | Wt 139.0 lb

## 2013-08-25 DIAGNOSIS — R059 Cough, unspecified: Secondary | ICD-10-CM

## 2013-08-25 DIAGNOSIS — R002 Palpitations: Secondary | ICD-10-CM

## 2013-08-25 DIAGNOSIS — R05 Cough: Secondary | ICD-10-CM

## 2013-08-25 DIAGNOSIS — J42 Unspecified chronic bronchitis: Secondary | ICD-10-CM

## 2013-08-25 DIAGNOSIS — R000 Tachycardia, unspecified: Secondary | ICD-10-CM

## 2013-08-25 MED ORDER — BENZONATATE 200 MG PO CAPS: 200.0000 mg | ORAL_CAPSULE | Freq: Three times a day (TID) | ORAL | Status: DC | PRN

## 2013-08-25 NOTE — Patient Instructions (Signed)
Order- office spirometry    Dx chronic bronchitis  Continue Dulera inhaler  Script refill benzonatate perles sent
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
## 2013-09-01 ENCOUNTER — Encounter (HOSPITAL_COMMUNITY)
Admission: RE | Admit: 2013-09-01 | Discharge: 2013-09-01 | Disposition: A | Discharge status: Home / Self Care | Payer: Medicare Other | Source: Ambulatory Visit | Attending: Cardiovascular Disease | Admitting: Cardiovascular Disease

## 2013-09-01 ENCOUNTER — Encounter (HOSPITAL_COMMUNITY): Payer: Self-pay

## 2013-09-01 DIAGNOSIS — R000 Tachycardia, unspecified: Secondary | ICD-10-CM

## 2013-09-01 DIAGNOSIS — R079 Chest pain, unspecified: Secondary | ICD-10-CM

## 2013-09-01 DIAGNOSIS — R002 Palpitations: Secondary | ICD-10-CM

## 2013-09-01 MED ORDER — TECHNETIUM TC 99M SESTAMIBI - CARDIOLITE
30.0000 | Freq: Once | INTRAVENOUS | Status: AC | PRN
  Administered 2013-09-01: 10:00:00 30 via INTRAVENOUS

## 2013-09-01 MED ORDER — REGADENOSON 0.4 MG/5ML IV SOLN
INTRAVENOUS | Status: AC
  Administered 2013-09-01: 0.4 mg via INTRAVENOUS
  Filled 2013-09-01: qty 5

## 2013-09-01 MED ORDER — SODIUM CHLORIDE 0.9 % IJ SOLN
INTRAMUSCULAR | Status: AC
  Administered 2013-09-01: 10 mL via INTRAVENOUS
  Filled 2013-09-01: qty 10

## 2013-09-01 MED ORDER — TECHNETIUM TC 99M SESTAMIBI GENERIC - CARDIOLITE
10.0000 | Freq: Once | INTRAVENOUS | Status: AC | PRN
  Administered 2013-09-01: 10 via INTRAVENOUS

## 2013-09-01 NOTE — Progress Notes (Signed)
Stress Lab Nurses Notes - [name] 09/01/2013 Reason for doing test: Chest Pain Type of test: Wille Glaser Nurse performing test: Gerrit Halls, RN Nuclear Medicine Tech: Melburn Hake Echo Tech: Not Applicable MD performing test: Gaynelle Cage MD: [place] Crane Test explained and consent signed: yes IV started: 22g jelco, Saline lock flushed, No redness or edema and Saline lock started in radiology Symptoms:headache, neck pain & chest discomfort  Treatment/Intervention: None Reason test stopped: protocol completed After recovery IV was: Discontinued via X-ray tech and No redness or edema Patient to return to [place]. Med at : 11:00 Patient discharged: Home Patient's Condition upon discharge was: stable Comments:During test BP 141/95 & HR 90.  Recovery BP 127/71 & HR 79.  Symptoms resolved in recovery.  Donnajean Lopes

## 2013-09-05 ENCOUNTER — Encounter: Payer: Self-pay | Admitting: *Deleted

## 2013-09-08 ENCOUNTER — Other Ambulatory Visit (INDEPENDENT_AMBULATORY_CARE_PROVIDER_SITE_OTHER): Payer: Medicare Other

## 2013-09-08 ENCOUNTER — Other Ambulatory Visit: Payer: Self-pay

## 2013-09-08 ENCOUNTER — Encounter: Payer: Self-pay | Admitting: Internal Medicine

## 2013-09-08 ENCOUNTER — Telehealth: Payer: Self-pay | Admitting: Cardiovascular Disease

## 2013-09-08 DIAGNOSIS — I359 Nonrheumatic aortic valve disorder, unspecified: Secondary | ICD-10-CM

## 2013-09-08 DIAGNOSIS — R002 Palpitations: Secondary | ICD-10-CM

## 2013-09-08 DIAGNOSIS — R079 Chest pain, unspecified: Secondary | ICD-10-CM

## 2013-09-08 DIAGNOSIS — I059 Rheumatic mitral valve disease, unspecified: Secondary | ICD-10-CM

## 2013-09-12 NOTE — Telephone Encounter (Signed)
Error

## 2013-09-13 ENCOUNTER — Telehealth: Payer: Self-pay | Admitting: *Deleted

## 2013-09-13 NOTE — Telephone Encounter (Signed)
Notes Recorded by Laurine Blazer, LPN on 0/07/4915 at 9:15 PM Patient notified. Stress test & echo showed normal pumping function. Explained again the importance of blood pressure control. Stated she has been wearing her support stockings recently & this seems to help some. Advised her to keep log of BP readings over next 2 weeks & return to office for MD review. Next OV is scheduled for 10/06/2013 with Dr. Bronson Ing. ------  Notes Recorded by Laurine Blazer, LPN on 0/56/9794 at 8:01 PM Left message to return call.  ------  Notes Recorded by Herminio Commons, MD on 09/08/2013 at 1:08 PM Optimal BP control will help with leg swelling. Agree with taking extra dose of Lasix to decrease swelling. Diastolic dysfunction ("heart stiffness with relaxation") is contributing to this, emphasizing the importance of BP control. ------  Notes Recorded by Laurine Blazer, LPN on 6/55/3748 at 27:07 PM Patient notified and verbalized understanding. States that she does continue to have swelling in her feet, ankles, and legs. No c/o increased SOB. Does notice weight difference of about 6 pounds off/on. States she does take an extra Lasix occasionally & helps. Advised message will be sent back to provider for advice. ------  Notes Recorded by Herminio Commons, MD on 09/08/2013 at 12:27 PM Normal pumping function. Heart is stiff with relaxation, common with both HTN and elderly.

## 2013-09-13 NOTE — Telephone Encounter (Signed)
Message copied by Laurine Blazer on Tue Sep 13, 2013  4:05 PM ------      Message from: Kate Sable A      Created: Thu Sep 08, 2013  1:08 PM       Optimal BP control will help with leg swelling. Agree with taking extra dose of Lasix to decrease swelling. Diastolic dysfunction ("heart stiffness with relaxation") is contributing to this, emphasizing the importance of BP control. ------

## 2013-09-25 DIAGNOSIS — J42 Unspecified chronic bronchitis: Secondary | ICD-10-CM | POA: Insufficient documentation

## 2013-09-25 NOTE — Assessment & Plan Note (Signed)
Continued emphasized reflux precautions 

## 2013-09-25 NOTE — Assessment & Plan Note (Addendum)
This mild chronic bronchitis with a reflux component and probably some allergic exacerbation now Office spirometry did not demonstrate an obvious reactive component responsive to bronchodilators Plan-continued Dulera at least through current supply. Consider steroid inhaler later. Benzonatate Perles

## 2013-10-06 ENCOUNTER — Ambulatory Visit: Payer: Medicare Other | Admitting: Cardiovascular Disease

## 2013-10-06 ENCOUNTER — Encounter: Payer: Self-pay | Admitting: Cardiovascular Disease

## 2013-10-06 ENCOUNTER — Ambulatory Visit (INDEPENDENT_AMBULATORY_CARE_PROVIDER_SITE_OTHER): Payer: Medicare Other | Admitting: Cardiovascular Disease

## 2013-10-06 VITALS — BP 128/76 | HR 46 | Ht 60.0 in | Wt 137.0 lb

## 2013-10-06 DIAGNOSIS — R609 Edema, unspecified: Secondary | ICD-10-CM

## 2013-10-06 DIAGNOSIS — I10 Essential (primary) hypertension: Secondary | ICD-10-CM

## 2013-10-06 DIAGNOSIS — R079 Chest pain, unspecified: Secondary | ICD-10-CM

## 2013-10-06 DIAGNOSIS — Z79899 Other long term (current) drug therapy: Secondary | ICD-10-CM

## 2013-10-06 DIAGNOSIS — R000 Tachycardia, unspecified: Secondary | ICD-10-CM

## 2013-10-06 DIAGNOSIS — R002 Palpitations: Secondary | ICD-10-CM

## 2013-10-06 MED ORDER — METOLAZONE 2.5 MG PO TABS: 2.5000 mg | ORAL_TABLET | ORAL | Status: DC

## 2013-10-06 MED ORDER — METOPROLOL TARTRATE 25 MG PO TABS: 25.0000 mg | ORAL_TABLET | Freq: Two times a day (BID) | ORAL | Status: DC
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
## 2013-10-06 NOTE — Patient Instructions (Signed)
   Decrease Metoprolol to 25mg  twice a day  - new sent to pharm  Begin Metolazone 2.5mg  daily x 3 days only, then STOP - new sent to pharm Continue all other medications.   Lab for BMET - due Monday, 10/10/2013 Office will contact with results via phone or letter.   Follow up in  6 weeks Please call office next week with update on how doing.

## 2013-10-07 ENCOUNTER — Encounter: Payer: Self-pay | Admitting: *Deleted

## 2013-10-07 NOTE — Telephone Encounter (Signed)
This encounter was created in error - please disregard.

## 2013-10-10 ENCOUNTER — Telehealth: Payer: Self-pay | Admitting: *Deleted

## 2013-10-10 ENCOUNTER — Other Ambulatory Visit: Payer: Self-pay | Admitting: Cardiovascular Disease

## 2013-10-10 DIAGNOSIS — N289 Disorder of kidney and ureter, unspecified: Secondary | ICD-10-CM

## 2013-10-10 DIAGNOSIS — Z79899 Other long term (current) drug therapy: Secondary | ICD-10-CM

## 2013-10-10 DIAGNOSIS — E876 Hypokalemia: Secondary | ICD-10-CM

## 2013-10-11 LAB — BASIC METABOLIC PANEL
BUN: 46 mg/dL — AB (ref 6–23)
CHLORIDE: 84 meq/L — AB (ref 96–112)
CO2: 43 meq/L — AB (ref 19–32)
CREATININE: 1.38 mg/dL — AB (ref 0.50–1.10)
Calcium: 10.7 mg/dL — ABNORMAL HIGH (ref 8.4–10.5)
Glucose, Bld: 94 mg/dL (ref 70–99)
Potassium: 2.8 mEq/L — ABNORMAL LOW (ref 3.5–5.3)
Sodium: 139 mEq/L (ref 135–145)

## 2013-10-12 ENCOUNTER — Other Ambulatory Visit: Payer: Self-pay | Admitting: *Deleted

## 2013-10-12 ENCOUNTER — Telehealth: Payer: Self-pay

## 2013-10-12 DIAGNOSIS — R002 Palpitations: Secondary | ICD-10-CM

## 2013-10-12 DIAGNOSIS — E876 Hypokalemia: Secondary | ICD-10-CM

## 2013-10-12 DIAGNOSIS — N289 Disorder of kidney and ureter, unspecified: Secondary | ICD-10-CM

## 2013-10-12 DIAGNOSIS — Z79899 Other long term (current) drug therapy: Secondary | ICD-10-CM

## 2013-10-12 MED ORDER — FUROSEMIDE 40 MG PO TABS: 40.0000 mg | ORAL_TABLET | Freq: Two times a day (BID) | ORAL | Status: DC

## 2013-10-12 NOTE — Telephone Encounter (Signed)
Notes Recorded by Laurine Blazer, LPN on 0/93/2671 at 2:45 PM Patient notified and verbalized understanding.   Notes Recorded by Herminio Commons, MD on 10/11/2013 at 9:12 AM Take 60 meq KCl for next 3 days, then back to regular dose. Repeat BMET in 1 week. Given abnormal renal function, would reduce Lasix back to 40 mg bid and recommend therapeutic compression stockings for leg swelling.  Follow up scheduled for 11/18/2013.

## 2013-10-12 NOTE — Telephone Encounter (Signed)
Patient advised that these symptoms she was feeling earlier may be related to her low potassium.  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  She was advised by nurse that EKG & BP were normal.  Advised to call back if not improving.  Patient verbalized understanding.

## 2013-10-12 NOTE — Telephone Encounter (Signed)
Per Dr.Koneswaran please do EKG and BP as pt saw him in hallway today( she volunteers  in the gift shop) with c/o racing heart   EKG NSR, BP 120/74   Per MD reassure pt that HR is in normal limits,pt states she runs int he 60's   Pt verbalized understanding of his diredtive

## 2013-11-18 ENCOUNTER — Encounter: Payer: Self-pay | Admitting: Cardiovascular Disease

## 2013-11-18 ENCOUNTER — Ambulatory Visit (INDEPENDENT_AMBULATORY_CARE_PROVIDER_SITE_OTHER): Payer: Medicare Other | Admitting: Cardiovascular Disease

## 2013-11-18 VITALS — BP 135/84 | HR 57 | Ht 60.0 in | Wt 132.0 lb

## 2013-11-18 DIAGNOSIS — R609 Edema, unspecified: Secondary | ICD-10-CM

## 2013-11-18 DIAGNOSIS — Z79899 Other long term (current) drug therapy: Secondary | ICD-10-CM

## 2013-11-18 DIAGNOSIS — N289 Disorder of kidney and ureter, unspecified: Secondary | ICD-10-CM

## 2013-11-18 DIAGNOSIS — R000 Tachycardia, unspecified: Secondary | ICD-10-CM

## 2013-11-18 DIAGNOSIS — I10 Essential (primary) hypertension: Secondary | ICD-10-CM

## 2013-11-18 DIAGNOSIS — R002 Palpitations: Secondary | ICD-10-CM

## 2013-11-18 MED ORDER — FUROSEMIDE 40 MG PO TABS: ORAL_TABLET | ORAL | Status: DC

## 2013-11-18 NOTE — Patient Instructions (Signed)
   Decrease Lasix to 40mg  every morning & 20mg  every evening  Continue Metoprolol at 25mg  twice a day   Continue all other medications.   Your physician wants you to follow up in: 6 months.  You will receive a reminder letter in the mail one-two months in advance.  If you don't receive a letter, please call our office to schedule the follow up appointment
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
## 2013-12-29 ENCOUNTER — Ambulatory Visit: Payer: Medicare Other | Admitting: Internal Medicine

## 2014-01-05 ENCOUNTER — Ambulatory Visit: Payer: Medicare Other | Admitting: Internal Medicine

## 2014-03-13 ENCOUNTER — Other Ambulatory Visit: Payer: Self-pay | Admitting: *Deleted

## 2014-03-13 MED ORDER — AMLODIPINE BESYLATE 5 MG PO TABS: 5.0000 mg | ORAL_TABLET | Freq: Every day | ORAL | Status: DC

## 2014-04-26 ENCOUNTER — Other Ambulatory Visit: Payer: Self-pay | Admitting: *Deleted

## 2014-04-26 MED ORDER — METOPROLOL TARTRATE 25 MG PO TABS: 25.0000 mg | ORAL_TABLET | Freq: Two times a day (BID) | ORAL | Status: DC

## 2014-06-12 ENCOUNTER — Telehealth: Payer: Self-pay | Admitting: *Deleted

## 2014-06-12 MED ORDER — AMLODIPINE BESYLATE 5 MG PO TABS: 5.0000 mg | ORAL_TABLET | Freq: Every day | ORAL | Status: DC

## 2014-06-12 NOTE — Telephone Encounter (Signed)
Refill request from [facility] [place] amlodipine 5 mg. Medication sent to pharmacy.

## 2014-07-17 ENCOUNTER — Ambulatory Visit (HOSPITAL_COMMUNITY)
Admission: RE | Admit: 2014-07-17 | Discharge: 2014-07-17 | Disposition: A | Discharge status: Home / Self Care | Payer: PPO | Source: Ambulatory Visit | Attending: Internal Medicine | Admitting: Internal Medicine

## 2014-07-17 ENCOUNTER — Other Ambulatory Visit (HOSPITAL_COMMUNITY): Payer: Self-pay | Admitting: Internal Medicine

## 2014-07-17 DIAGNOSIS — M25571 Pain in right ankle and joints of right foot: Secondary | ICD-10-CM | POA: Diagnosis not present

## 2014-07-17 DIAGNOSIS — W19XXXA Unspecified fall, initial encounter: Secondary | ICD-10-CM | POA: Diagnosis not present

## 2014-07-17 DIAGNOSIS — R52 Pain, unspecified: Secondary | ICD-10-CM

## 2014-07-24 ENCOUNTER — Other Ambulatory Visit: Payer: Self-pay | Admitting: *Deleted

## 2014-07-24 MED ORDER — METOPROLOL TARTRATE 25 MG PO TABS: 25.0000 mg | ORAL_TABLET | Freq: Two times a day (BID) | ORAL | Status: DC

## 2014-08-09 ENCOUNTER — Other Ambulatory Visit: Payer: Self-pay | Admitting: Cardiovascular Disease

## 2014-08-24 ENCOUNTER — Other Ambulatory Visit: Payer: Self-pay | Admitting: Cardiovascular Disease

## 2014-09-07 ENCOUNTER — Other Ambulatory Visit: Payer: Self-pay | Admitting: Cardiovascular Disease

## 2014-09-11 ENCOUNTER — Other Ambulatory Visit: Payer: Self-pay | Admitting: Cardiovascular Disease

## 2014-09-20 ENCOUNTER — Other Ambulatory Visit: Payer: Self-pay | Admitting: Cardiovascular Disease

## 2014-10-10 ENCOUNTER — Other Ambulatory Visit: Payer: Self-pay | Admitting: Cardiology

## 2014-11-24 ENCOUNTER — Encounter: Payer: Self-pay | Admitting: Cardiovascular Disease

## 2014-11-24 ENCOUNTER — Ambulatory Visit (INDEPENDENT_AMBULATORY_CARE_PROVIDER_SITE_OTHER): Payer: PPO | Admitting: Cardiovascular Disease

## 2014-11-24 VITALS — BP 140/70 | HR 60 | Ht 64.0 in | Wt 136.0 lb

## 2014-11-24 DIAGNOSIS — R609 Edema, unspecified: Secondary | ICD-10-CM | POA: Diagnosis not present

## 2014-11-24 DIAGNOSIS — R079 Chest pain, unspecified: Secondary | ICD-10-CM | POA: Diagnosis not present

## 2014-11-24 DIAGNOSIS — I10 Essential (primary) hypertension: Secondary | ICD-10-CM

## 2014-11-24 DIAGNOSIS — R002 Palpitations: Secondary | ICD-10-CM | POA: Diagnosis not present

## 2014-11-24 NOTE — Progress Notes (Signed)
Patient ID: [name], female   DOB: Dec 17, 1935, 79 y.o.   MRN: [id number]      SUBJECTIVE: The patient returns for follow-up of chest pain, palpitations, leg swelling, and hypertension. She has been having leg swelling but has not used compression stockings due to the heat and humidity. She denies chest pain and palpitations. She has neck pain and lumbar pain related to osteopenia and arthritis.   Review of Systems: As per "subjective", otherwise negative.  Allergies  Allergen Reactions  . Epinephrine Anaphylaxis  . Demerol [Meperidine] Other (See Comments)    headaches  . Vicodin [Hydrocodone-Acetaminophen] Other (See Comments)    [name] it makes her crazy    Current Outpatient Prescriptions  Medication Sig Dispense Refill  . albuterol (PROVENTIL) (2.5 MG/3ML) 0.083% nebulizer solution Take 3 mLs (2.5 mg total) by nebulization every 6 (six) hours as needed for wheezing or shortness of breath. 75 mL 12  . amLODipine (NORVASC) 5 MG tablet TAKE 1 TABLET BY MOUTH DAILY 15 tablet 0  . Ascorbic Acid (VITAMIN C) 1000 MG tablet Take 1,000 mg by mouth daily.    . B Complex Vitamins (VITAMIN B COMPLEX) TABS Take 1 tablet by mouth daily.    . benzonatate (TESSALON) 200 MG capsule Take 1 capsule (200 mg total) by mouth 3 (three) times daily as needed for cough. 30 capsule 5  . Biotin 300 MCG TABS Take 1 tablet by mouth daily.    Marland Kitchen BLACK COHOSH PO Take 1 capsule by mouth daily. Patient states strength is 300mg     . CALCIUM CARBONATE PO Take 1 tablet by mouth daily. Patient states medication is calcium 1200 mg without vitamin D.    . cholecalciferol (VITAMIN D) 1000 UNITS tablet Take 1,000 Units by mouth daily.    . CYANOCOBALAMIN PO Take 1 tablet by mouth daily. Patient unsure of strength    . Ferrous Gluconate (IRON) 240 (27 FE) MG TABS Take 1 tablet by mouth daily.    . Flaxseed, Linseed, (FLAXSEED OIL) 1000 MG CAPS Take 1 capsule by mouth daily.     Marland Kitchen FLUoxetine (PROZAC) 10 MG  tablet Take 5 mg by mouth daily.    . fluticasone (FLOVENT HFA) 110 MCG/ACT inhaler Inhale 1 puff into the lungs 2 (two) times daily.    . furosemide (LASIX) 40 MG tablet Take 1 tab (40mg ) by mouth every morning & 1/2 tab (20mg ) every evening    . Lecithin 1200 MG CAPS Take 1 capsule by mouth daily.     Marland Kitchen levothyroxine (SYNTHROID, LEVOTHROID) 50 MCG tablet Take 1 tablet by mouth daily.    . LUTEIN PO Take 1 capsule by mouth daily. Strength varies based on what is purchased at the time.    . Magnesium 250 MG TABS Take 1 tablet by mouth daily.    . metolazone (ZAROXOLYN) 2.5 MG tablet Take 1 tablet (2.5 mg total) by mouth as directed. 30 tablet 0  . metoprolol tartrate (LOPRESSOR) 25 MG tablet TAKE 1 TABLET BY MOUTH TWICE DAILY 30 tablet 0  . metoprolol tartrate (LOPRESSOR) 25 MG tablet TAKE 1 TABLET BY MOUTH TWICE DAILY 60 tablet 6  . mometasone-formoterol (DULERA) 200-5 MCG/ACT AERO Inhale 2 puffs into the lungs 2 (two) times daily.    . Nebulizers (COMPRESSOR/NEBULIZER) MISC Use as directed 1 each 0  . Omega-3 Fatty Acids (FISH OIL) 1200 MG CAPS Take 1,200 mg by mouth daily.    Marland Kitchen omeprazole (PRILOSEC) 20 MG capsule Take 20 mg by mouth  daily.    . potassium chloride (K-DUR) 10 MEQ tablet Take 10 mEq by mouth 2 (two) times daily.    . traMADol (ULTRAM) 50 MG tablet Take by mouth every 6 (six) hours as needed. pain    . vitamin E 400 UNIT capsule Take 400 Units by mouth daily.    Marland Kitchen warfarin (COUMADIN) 4 MG tablet Take 1 tablet by mouth daily. Or as directed based on labs    . ZETIA 10 MG tablet Take 1 tablet by mouth Daily.    . Zinc 50 MG TABS Take 1 tablet by mouth daily.     No current facility-administered medications for this visit.    Past Medical History  Diagnosis Date  . CVA (cerebral vascular accident)   . TIA (transient ischemic attack)   . HTN (hypertension)   . Asthma   . Hypothyroidism   . Hyperplastic colon polyp 2005  . Hemorrhoids 2007  . Adenomatous colon polyp 2008    . Diverticulosis   . Narcolepsy     Past Surgical History  Procedure Laterality Date  . Abdominal hysterectomy      age 6, complicated by poor wound healing, followed by revision of scar and radiation for ?malignancy  . Colonoscopy  2005    2 hyperplastic polyps  . Colonoscopy  2007    Dr. Oneida Alar- hemorrhoids  . Colonoscopy  2008    Dr. Reed Pandy polyp- rare sigmoid diverticulosis, internal hemorrhoids  . Eye surgery  2010  . Breast enhancement surgery    . Eye surgery    . Right thumb surgery    . Colonoscopy  12/01/2011    Procedure: COLONOSCOPY;  Surgeon: Danie Binder, MD;  [facility];  Service: Endoscopy;  Laterality: N/A;  12:30 PM  . Esophagogastroduodenoscopy (egd) with esophageal dilation N/A 04/15/2013    Procedure: ESOPHAGOGASTRODUODENOSCOPY (EGD) WITH ESOPHAGEAL DILATION;  Surgeon: Danie Binder, MD;  [facility]: [facility];  Service: Endoscopy;  Laterality: N/A;  11:45-moved to 12:30 Darius Bump to notify pt    History   Social History  . Marital Status: Widowed    Spouse Name: N/A  . Number of Children: N/A  . Years of Education: N/A   Occupational History  . Not on file.   Social History Main Topics  . Smoking status: Never Smoker   . Smokeless tobacco: Never Used  . Alcohol Use: No  . Drug Use: No  . Sexual Activity: Not on file   Other Topics Concern  . Not on file   Social History Narrative     Filed Vitals:   11/24/14 1126  BP: 140/70  Pulse: 60  Height: 5\' 4"  (1.626 m)  Weight: 136 lb (61.689 kg)  SpO2: 98%    PHYSICAL EXAM General: NAD HEENT: Normal. Neck: No JVD, no thyromegaly. Lungs: Clear to auscultation bilaterally with normal respiratory effort. CV: Nondisplaced PMI.  Regular rate and rhythm, normal S1/S2, no S3/S4, no murmur. Trace pretibial and periankle edema.  No carotid bruit.  Normal pedal pulses.  Abdomen: Soft, nontender, no distention.  Neurologic: Alert and oriented x 3.  Psych: Normal  affect. Skin: Normal. Musculoskeletal: Mild kyphosis. Extremities: No clubbing or cyanosis.   ECG: Most recent ECG reviewed.      ASSESSMENT AND PLAN: 1. Chest pain: No further symptoms since BP under control. She underwent coronary angiography in 2008 which revealed nonobstructive disease. Lexiscan Cardiolite stress testing was normal in 08/2013. Echocardiogram demonstrated normal left ventricular systolic function and  grade 2 diastolic function.   2. Essential hypertension: Well controlled on amlodipine 5 mg daily. No changes.  3. Tachycardia/palpitations: Symptomatically stable. Prior event monitoring did not demonstrate any supraventricular tachyarrhythmias. Symptoms correlated with sinus rhythm.   4. Leg swelling: Continue Lasix 40 mg q am and 20 mg q pm. She will use compression stockings when the weather is cooler.  Dispo: f/u 1 year.  Kate Sable, M.D., F.A.C.C.

## 2014-11-24 NOTE — Patient Instructions (Signed)
Continue all current medications. Your physician wants you to follow up in:  1 year.  You will receive a reminder letter in the mail one-two months in advance.  If you don't receive a letter, please call our office to schedule the follow up appointment   

## 2015-01-11 ENCOUNTER — Ambulatory Visit (INDEPENDENT_AMBULATORY_CARE_PROVIDER_SITE_OTHER): Payer: PPO | Admitting: Gastroenterology

## 2015-01-11 ENCOUNTER — Encounter: Payer: Self-pay | Admitting: Gastroenterology

## 2015-01-11 VITALS — BP 131/71 | HR 51 | Temp 98.2°F | Ht 64.0 in | Wt 137.2 lb

## 2015-01-11 DIAGNOSIS — R1011 Right upper quadrant pain: Secondary | ICD-10-CM

## 2015-01-11 DIAGNOSIS — R194 Change in bowel habit: Secondary | ICD-10-CM | POA: Diagnosis not present

## 2015-01-11 DIAGNOSIS — G8929 Other chronic pain: Secondary | ICD-10-CM | POA: Insufficient documentation

## 2015-01-11 DIAGNOSIS — R1010 Upper abdominal pain, unspecified: Secondary | ICD-10-CM | POA: Diagnosis not present

## 2015-01-11 DIAGNOSIS — R634 Abnormal weight loss: Secondary | ICD-10-CM

## 2015-01-11 LAB — CREATININE, SERUM: CREATININE: 1 mg/dL — AB (ref 0.60–0.93)

## 2015-01-11 NOTE — Assessment & Plan Note (Signed)
ASSOCIATED WITH WEIGHT LOSS AND RLQ ABD PAIN. DIFFERENTIAL DIAGNOSIS INCLUDES INCISIONAL HERNIA, CHANGE IN BOWEL HABITS DUE TO CHANGE IN DIET, RECURRENCE OF PELVIC CANCER, AND LESS LIKELY ADHESION DUE TO RADIATION AND PRIOR SURGERY.   CONTINUE PROBIOTIC. AVOID FOOD THAT MAY CAUSE GAS AND BLOATING.  HANDOUT GIVEN. COMPLETE CT SCAN ABD/PELVIS. CREATININE TODAY. FOLLOW UP IN 4 MOS.   GREATER THAN 50% WAS SPENT IN COUNSELING & COORDINATION OF CARE WITH THE PATIENT: DISCUSSED DIFFERENTIAL DIAGNOSIS, PROCEDURE, BENEFITS, RISKS, AND MANAGEMENT OF RLQ PAIN. TOTAL ENCOUNTER TIME: 57 MINS.

## 2015-01-11 NOTE — Progress Notes (Signed)
Subjective:    Patient ID: [name], female    DOB: 1935/11/19, 79 y.o.   MRN: [id number]  Delphina Cahill, MD  HPI STARTED NOTICING PAIN IN RLQ AND BULGE SINCE A FEW WEEKS. OVER PAST FEW WEEKS INTENSITY IS THE SAME. PAIN IS MOST OF THE TIME. DOESN'T KEEP HER AWAKE UP WITH BACK AND LEG PAIN. ACHY-RADIATION-MOSTLY IN RLQ.  ?? ASSOCIATED WITH FREQUENT BMs> BULGE IS JUST IN RLQ. AS BIG AS AN ORANGE. FOOD DOESN'T CHANGE BULGE. WORSE: AFTER EATS, WHEN HAS URGE TO HAVE BM. BETTER: NOTHING. NO NEW MEDS. TAKING OTC PROBIOTIC INSTEAD OF OMEPRAZOLE. TAKES A LOT OF SUPPLEMENTS.  COUPLE MOS AGO STARTING HAVING LARGE SOLID BMs. THIS IS A CHANGE IN HER BOWEL IN HABITS,  THAT MAY BE EXPLOSIVE AND CAN BE LIKE PEBBLES. RARE LOOSE STOOLS. DIET HASN'T CHANGED-? MORE TOMATO SANDWICH. SITS ON TOILET-LONG HOT DOG THIS AM. BMs: 4-6/DAY(#1-5). NO NOCTURNAL STOOLS. MILK: IN AM. ICE CREAM: NO, CHEESE: ONCE A WEEK.  BEEN EXERCISING AND CONCERNED ABOUT GAINING WEIGHT. IN 2014: 140 LBS. WANTS TO LOSE WEIGHT SO SHE CAN FIT IN HER CLOTHES. IS DELIBERATE WITH SWALLOWING AFTER STROKE.CHEST PAIN/SHORTNESS OF BREATH  IF SHE HAS A ALLERGY FLARE. HARD TO PEE IF SHE'S HAVING A MOVEMENT AT THE SAME TIME.   PT DENIES FEVER, CHILLS, HEMATOCHEZIA, nausea, vomiting, melena,  OR heartburn or indigestion.   Past Medical History  Diagnosis Date  . CVA (cerebral vascular accident)   . TIA (transient ischemic attack)   . HTN (hypertension)   . Asthma   . Hypothyroidism   . Hyperplastic colon polyp 2005  . Hemorrhoids 2007  . Adenomatous colon polyp 2008  . Diverticulosis   . Narcolepsy    Past Surgical History  Procedure Laterality Date  . Abdominal hysterectomy      age 43, complicated by poor wound healing, followed by revision of scar and radiation for ?malignancy  . Colonoscopy  2005    2 hyperplastic polyps  . Colonoscopy  2007    Dr. Oneida Alar- hemorrhoids  . Colonoscopy  2008    Dr. Reed Pandy polyp-  rare sigmoid diverticulosis, internal hemorrhoids  . Eye surgery  2010  . Breast enhancement surgery    . Eye surgery    . Right thumb surgery    . Colonoscopy  12/01/2011    Procedure: COLONOSCOPY;  Surgeon: Danie Binder, MD;  [facility];  Service: Endoscopy;  Laterality: N/A;  12:30 PM  . Esophagogastroduodenoscopy (egd) with esophageal dilation N/A 04/15/2013    Procedure: ESOPHAGOGASTRODUODENOSCOPY (EGD) WITH ESOPHAGEAL DILATION;  Surgeon: Danie Binder, MD;  [facility];  Service: Endoscopy;  Laterality: N/A;  11:45-moved to 12:30 Darius Bump to notify pt   Allergies  Allergen Reactions  . Epinephrine Anaphylaxis  . Demerol [Meperidine] Other (See Comments)    headaches  . Vicodin [Hydrocodone-Acetaminophen] Other (See Comments)    [name] it makes her crazy   Current Outpatient Prescriptions  Medication Sig Dispense Refill  . albuterol (PROVENTIL) (2.5 MG/3ML) 0.083% nebulizer solution Take 3 mLs (2.5 mg total) by nebulization every 6 (six) hours as needed for wheezing or shortness of breath.    Marland Kitchen amLODipine (NORVASC) 5 MG tablet TAKE 1 TABLET BY MOUTH DAILY    . Ascorbic Acid (VITAMIN C) 1000 MG tablet Take 1,000 mg by mouth daily.    . B Complex Vitamins (VITAMIN B COMPLEX) TABS Take 1 tablet by mouth daily.    . Biotin 300 MCG TABS  Take 1 tablet by mouth daily.    Marland Kitchen CALCIUM CARBONATE PO Take 1 tablet by mouth daily. Patient states medication is calcium 1200 mg without vitamin D.    . cholecalciferol (VITAMIN D) 1000 UNITS tablet Take 1,000 Units by mouth daily.    . CYANOCOBALAMIN PO Take 1 tablet by mouth daily. Patient unsure of strength    . Ferrous Gluconate (IRON) 240 (27 FE) MG TABS Take 1 tablet by mouth daily.    . Flaxseed, Linseed, (FLAXSEED OIL) 1000 MG CAPS Take 1 capsule by mouth daily.     Marland Kitchen FLUoxetine (PROZAC) 10 MG tablet Take 5 mg by mouth daily.    . fluticasone (FLOVENT HFA) 110 MCG/ACT inhaler Inhale 1 puff into the lungs 2 (two) times  daily.    . furosemide (LASIX) 40 MG tablet Take 1 tab (40mg ) by mouth every morning & 1/2 tab (20mg ) every evening    . Lecithin 1200 MG CAPS Take 1 capsule by mouth daily.     Marland Kitchen levothyroxine (SYNTHROID, LEVOTHROID) 50 MCG tablet Take 1 tablet by mouth daily.    . LUTEIN PO Take 1 capsule by mouth daily. Strength varies based on what is purchased at the time.    . Magnesium 250 MG TABS Take 1 tablet by mouth daily.    . metoprolol tartrate (LOPRESSOR) 25 MG tablet TAKE 1 TABLET BY MOUTH TWICE DAILY    . mometasone-formoterol (DULERA) 200-5 MCG/ACT AERO Inhale 2 puffs into the lungs 2 (two) times daily.    . Nebulizers (COMPRESSOR/NEBULIZER) MISC Use as directed    . Omega-3 Fatty Acids (FISH OIL) 1200 MG CAPS Take 1,200 mg by mouth daily.    . potassium chloride (K-DUR) 10 MEQ tablet Take 10 mEq by mouth 2 (two) times daily.    . Probiotic Product (PROBIOTIC DAILY PO) Take by mouth.    . vitamin E 400 UNIT capsule Take 400 Units by mouth daily.    Marland Kitchen warfarin (COUMADIN) 4 MG tablet Take 1 tablet by mouth daily. Or as directed based on labs    . ZETIA 10 MG tablet Take 1 tablet by mouth Daily.    . Zinc 50 MG TABS Take 1 tablet by mouth daily.    . benzonatate (TESSALON) 200 MG capsule Take 1 capsule (200 mg total) by mouth 3 (three) times daily as needed for cough.    Marland Kitchen BLACK COHOSH PO Take 1 capsule by mouth daily. Patient states strength is 300mg     . metolazone (ZAROXOLYN) 2.5 MG tablet Take 1 tablet (2.5 mg total) by mouth as directed. (Patient not taking: Reported on 01/11/2015)    .      . traMADol (ULTRAM) 50 MG tablet Take by mouth every 6 (six) hours as needed. pain     Family History  Problem Relation Age of Onset  . Colon cancer Father     age 60  . Prostate cancer Father   . Pancreatic cancer Mother     age 22   Review of Systems PER HPI OTHERWISE ALL SYSTEMS ARE NEGATIVE.     Objective:   Physical Exam  Constitutional: She is oriented to person, place, and time. She  appears well-developed and well-nourished. No distress.  HENT:  Head: Normocephalic and atraumatic.  Mouth/Throat: Oropharynx is clear and moist. No oropharyngeal exudate.  Eyes: Pupils are equal, round, and reactive to light. No scleral icterus.  Neck: Normal range of motion. Neck supple.  Cardiovascular: Normal rate, regular rhythm and normal  heart sounds.   Pulmonary/Chest: Effort normal and breath sounds normal. No respiratory distress.  Abdominal: Soft. Bowel sounds are normal. She exhibits no distension and no mass. There is tenderness. There is no rebound and no guarding.  MILD RLQ TTP WHICH IMPROVES WITH VALSALVA. SMALL BULGE IN RLQ ALONG INCISION.   Musculoskeletal: She exhibits no edema.  Lymphadenopathy:    She has no cervical adenopathy.  Neurological: She is alert and oriented to person, place, and time.  NO  NEW FOCAL DEFICITS   Psychiatric:  FLAT AFFECT, NL MOOD  Vitals reviewed.         Assessment & Plan:

## 2015-01-11 NOTE — Progress Notes (Signed)
ON RECALL  °

## 2015-01-11 NOTE — Assessment & Plan Note (Signed)
ASSOCIATED WITH WEIGHT LOSS AND change on bowel habits-LAST TCS JUL 2013. DIFFERENTIAL DIAGNOSIS INCLUDES INCISIONAL HERNIA, CHANGE IN BOWEL HABITS DUE TO CHANGE IN DIET, RECURRENCE OF PELVIC CANCER, AND LESS LIKELY ADHESION DUE TO RADIATION AND PRIOR SURGERY.   CONTINUE PROBIOTIC. AVOID FOOD THAT MAY CAUSE GAS AND BLOATING.  HANDOUT GIVEN. COMPLETE CT SCAN ABD/PELVIS. CREATININE TODAY. FOLLOW UP IN 4 MOS.

## 2015-01-11 NOTE — Patient Instructions (Signed)
CONTINUE SUPPLEMENTS.  COMPLETE BLOOD DRAW PRIOR TO GETTING YOUR CT SCAN.  AVOID ITEMS THAT CAUSE BLOATING & GAS. SEE INFO BELOW.  FOLLOW UP IN 4 MOS.    BLOATING AND GAS PREVENTION  Although gas may be uncomfortable and embarrassing, it is not life-threatening. Understanding causes, ways to reduce symptoms, and treatment will help most people find some relief.  Points to remember . Everyone has gas in the digestive tract. Marland Kitchen People often believe normal passage of gas to be excessive. . Gas comes from two main sources: swallowed air and normal breakdown of certain foods by harmless bacteria naturally present in the large intestine. . Many foods with carbohydrates can cause gas. Fats and proteins cause little gas. . Foods that may cause gas include o beans  o vegetables, such as broccoli, cabbage, brussels sprouts, onions, artichokes, and asparagus  o fruits, such as pears, apples, and peaches  o whole grains, such as whole wheat and bran  o soft drinks and fruit drinks  o milk and milk products, such as cheese and ice cream, and packaged foods prepared with lactose, such as bread, cereal, and salad dressing  o foods containing sorbitol, such as dietetic foods and sugar free candies and gums . The most common symptoms of gas are belching, flatulence, bloating, and abdominal pain.  . The most common ways to reduce the discomfort of gas are changing diet, taking nonprescription medicines, and reducing the amount of air swallowed. . Digestive enzymes, such as lactase supplements, actually help digest carbohydrates and may allow people to eat foods that normally cause gas.

## 2015-01-11 NOTE — Assessment & Plan Note (Signed)
ASSOCIATED WITH change in bowel habits AND RLQ ABD PAIN. DIFFERENTIAL DIAGNOSIS INCLUDES INCISIONAL HERNIA, CHANGE IN BOWEL HABITS DUE TO CHANGE IN DIET, RECURRENCE OF PELVIC CANCER, AND LESS LIKELY ADHESION DUE TO RADIATION AND PRIOR SURGERY.   CONTINUE PROBIOTIC. AVOID FOOD THAT MAY CAUSE GAS AND BLOATING.  HANDOUT GIVEN. COMPLETE CT SCAN ABD/PELVIS. CREATININE TODAY. FOLLOW UP IN 4 MOS.

## 2015-01-11 NOTE — Progress Notes (Signed)
cc'ed to pcp °

## 2015-01-15 ENCOUNTER — Ambulatory Visit (HOSPITAL_COMMUNITY)
Admission: RE | Admit: 2015-01-15 | Discharge: 2015-01-15 | Disposition: A | Discharge status: Home / Self Care | Payer: PPO | Source: Ambulatory Visit | Attending: Gastroenterology | Admitting: Gastroenterology

## 2015-01-15 DIAGNOSIS — K5730 Diverticulosis of large intestine without perforation or abscess without bleeding: Secondary | ICD-10-CM | POA: Diagnosis not present

## 2015-01-15 DIAGNOSIS — R1011 Right upper quadrant pain: Secondary | ICD-10-CM

## 2015-01-15 DIAGNOSIS — G8929 Other chronic pain: Secondary | ICD-10-CM

## 2015-01-15 DIAGNOSIS — R194 Change in bowel habit: Secondary | ICD-10-CM | POA: Diagnosis not present

## 2015-01-15 DIAGNOSIS — R1031 Right lower quadrant pain: Secondary | ICD-10-CM | POA: Insufficient documentation

## 2015-01-15 DIAGNOSIS — N200 Calculus of kidney: Secondary | ICD-10-CM | POA: Insufficient documentation

## 2015-01-15 DIAGNOSIS — R634 Abnormal weight loss: Secondary | ICD-10-CM

## 2015-01-15 MED ORDER — IOHEXOL 300 MG/ML  SOLN
100.0000 mL | Freq: Once | INTRAMUSCULAR | Status: AC | PRN
  Administered 2015-01-15: 100 mL via INTRAVENOUS

## 2015-01-15 MED ORDER — SODIUM CHLORIDE 0.9 % IJ SOLN
INTRAMUSCULAR | Status: AC
  Filled 2015-01-15: qty 75

## 2015-01-16 ENCOUNTER — Telehealth: Payer: Self-pay | Admitting: Gastroenterology

## 2015-01-16 DIAGNOSIS — G8929 Other chronic pain: Secondary | ICD-10-CM

## 2015-01-16 DIAGNOSIS — R1011 Right upper quadrant pain: Principal | ICD-10-CM

## 2015-01-16 NOTE — Telephone Encounter (Signed)
PLEASE CALL PT. HER CT SHOWS KIDNEY STONES WITH MILD OBSTRUCTION OF HER URETERS ON BOTH SIDES, SMALL GALLSTONES, BENIGN LIVER CYSTS,  AND DIVERTICULOSIS IN HER COLON AND SMALL BOWEL. SHE DOES NOT HAVE A MASS IN HER PANCREAS, OR HER PELVIS. SHE SHOULD SEE HER UROLOGIST. NO ADDITIONAL GI WORKUP IS NEEDED AT THIS TIME. CONTINUE OMEPRAZOLE AND A LOW FTA DIET/

## 2015-01-16 NOTE — Telephone Encounter (Signed)
LMOM to call.

## 2015-01-17 NOTE — Telephone Encounter (Signed)
Pt is aware of instructions. She said she would like a referral to Urologist in [place], [place].

## 2015-01-18 ENCOUNTER — Other Ambulatory Visit: Payer: Self-pay

## 2015-01-18 DIAGNOSIS — N200 Calculus of kidney: Secondary | ICD-10-CM

## 2015-01-18 NOTE — Telephone Encounter (Signed)
Referral sent to [facility] 

## 2015-01-18 NOTE — Telephone Encounter (Signed)
REVIEWED-NO ADDITIONAL RECOMMENDATIONS. 

## 2015-02-08 ENCOUNTER — Other Ambulatory Visit: Payer: Self-pay | Admitting: Cardiovascular Disease

## 2015-04-10 ENCOUNTER — Encounter: Payer: Self-pay | Admitting: Gastroenterology

## 2015-06-13 DIAGNOSIS — I482 Chronic atrial fibrillation: Secondary | ICD-10-CM | POA: Diagnosis not present

## 2015-06-13 DIAGNOSIS — F411 Generalized anxiety disorder: Secondary | ICD-10-CM | POA: Diagnosis not present

## 2015-06-13 DIAGNOSIS — R05 Cough: Secondary | ICD-10-CM | POA: Diagnosis not present

## 2015-06-27 ENCOUNTER — Other Ambulatory Visit: Payer: Self-pay | Admitting: Cardiovascular Disease

## 2015-07-11 DIAGNOSIS — I482 Chronic atrial fibrillation: Secondary | ICD-10-CM | POA: Diagnosis not present

## 2015-07-11 DIAGNOSIS — L988 Other specified disorders of the skin and subcutaneous tissue: Secondary | ICD-10-CM | POA: Diagnosis not present

## 2015-08-03 DIAGNOSIS — L738 Other specified follicular disorders: Secondary | ICD-10-CM | POA: Diagnosis not present

## 2015-08-03 DIAGNOSIS — L821 Other seborrheic keratosis: Secondary | ICD-10-CM | POA: Diagnosis not present

## 2015-08-03 DIAGNOSIS — D1801 Hemangioma of skin and subcutaneous tissue: Secondary | ICD-10-CM | POA: Diagnosis not present

## 2015-08-08 DIAGNOSIS — R55 Syncope and collapse: Secondary | ICD-10-CM | POA: Diagnosis not present

## 2015-08-15 DIAGNOSIS — T8543XA Leakage of breast prosthesis and implant, initial encounter: Secondary | ICD-10-CM | POA: Diagnosis not present

## 2015-08-17 DIAGNOSIS — E782 Mixed hyperlipidemia: Secondary | ICD-10-CM | POA: Diagnosis not present

## 2015-08-17 DIAGNOSIS — E119 Type 2 diabetes mellitus without complications: Secondary | ICD-10-CM | POA: Diagnosis not present

## 2015-08-27 DIAGNOSIS — R600 Localized edema: Secondary | ICD-10-CM | POA: Diagnosis not present

## 2015-08-27 DIAGNOSIS — T8549XA Other mechanical complication of breast prosthesis and implant, initial encounter: Secondary | ICD-10-CM | POA: Diagnosis not present

## 2015-08-27 DIAGNOSIS — E039 Hypothyroidism, unspecified: Secondary | ICD-10-CM | POA: Diagnosis not present

## 2015-08-27 DIAGNOSIS — I482 Chronic atrial fibrillation: Secondary | ICD-10-CM | POA: Diagnosis not present

## 2015-09-13 DIAGNOSIS — M542 Cervicalgia: Secondary | ICD-10-CM | POA: Diagnosis not present

## 2015-09-13 DIAGNOSIS — M546 Pain in thoracic spine: Secondary | ICD-10-CM | POA: Diagnosis not present

## 2015-09-13 DIAGNOSIS — N62 Hypertrophy of breast: Secondary | ICD-10-CM | POA: Diagnosis not present

## 2015-09-13 DIAGNOSIS — T8544XA Capsular contracture of breast implant, initial encounter: Secondary | ICD-10-CM | POA: Diagnosis not present

## 2015-09-13 DIAGNOSIS — T8549XA Other mechanical complication of breast prosthesis and implant, initial encounter: Secondary | ICD-10-CM | POA: Diagnosis not present

## 2015-10-05 DIAGNOSIS — I482 Chronic atrial fibrillation: Secondary | ICD-10-CM | POA: Diagnosis not present

## 2015-10-25 ENCOUNTER — Other Ambulatory Visit: Payer: Self-pay | Admitting: Cardiovascular Disease

## 2015-11-06 DIAGNOSIS — I10 Essential (primary) hypertension: Secondary | ICD-10-CM | POA: Diagnosis not present

## 2015-11-06 DIAGNOSIS — I482 Chronic atrial fibrillation: Secondary | ICD-10-CM | POA: Diagnosis not present

## 2015-11-21 DIAGNOSIS — I482 Chronic atrial fibrillation: Secondary | ICD-10-CM | POA: Diagnosis not present

## 2015-11-21 DIAGNOSIS — I10 Essential (primary) hypertension: Secondary | ICD-10-CM | POA: Diagnosis not present

## 2015-11-21 DIAGNOSIS — R001 Bradycardia, unspecified: Secondary | ICD-10-CM | POA: Diagnosis not present

## 2015-11-22 ENCOUNTER — Other Ambulatory Visit: Payer: Self-pay | Admitting: Cardiovascular Disease

## 2015-12-19 DIAGNOSIS — R001 Bradycardia, unspecified: Secondary | ICD-10-CM | POA: Diagnosis not present

## 2015-12-19 DIAGNOSIS — Z7901 Long term (current) use of anticoagulants: Secondary | ICD-10-CM | POA: Diagnosis not present

## 2015-12-19 DIAGNOSIS — I10 Essential (primary) hypertension: Secondary | ICD-10-CM | POA: Diagnosis not present

## 2015-12-19 DIAGNOSIS — R296 Repeated falls: Secondary | ICD-10-CM | POA: Diagnosis not present

## 2015-12-23 ENCOUNTER — Other Ambulatory Visit: Payer: Self-pay | Admitting: Cardiovascular Disease

## 2016-01-04 ENCOUNTER — Ambulatory Visit (INDEPENDENT_AMBULATORY_CARE_PROVIDER_SITE_OTHER): Payer: PPO | Admitting: Cardiovascular Disease

## 2016-01-04 ENCOUNTER — Encounter: Payer: Self-pay | Admitting: Cardiovascular Disease

## 2016-01-04 VITALS — BP 132/80 | HR 67 | Ht 61.0 in | Wt 138.0 lb

## 2016-01-04 DIAGNOSIS — I10 Essential (primary) hypertension: Secondary | ICD-10-CM | POA: Diagnosis not present

## 2016-01-04 DIAGNOSIS — R002 Palpitations: Secondary | ICD-10-CM

## 2016-01-04 DIAGNOSIS — R609 Edema, unspecified: Secondary | ICD-10-CM | POA: Diagnosis not present

## 2016-01-04 DIAGNOSIS — R072 Precordial pain: Secondary | ICD-10-CM

## 2016-01-04 MED ORDER — METOPROLOL TARTRATE 25 MG PO TABS: ORAL_TABLET | ORAL | 11 refills | Status: DC

## 2016-01-04 NOTE — Progress Notes (Signed)
SUBJECTIVE: The patient returns for follow-up of chest pain, palpitations, leg swelling, and hypertension.  She thinks a saline implant in her right breast burst as she developed bruising last week. She wonders if it should come out. She denies exertional chest pain. She continues to have palpitations in the morning when getting ready for work. 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  Allergies  Allergen Reactions  . Epinephrine Anaphylaxis  . Demerol [Meperidine] Other (See Comments)    headaches  . Vicodin [Hydrocodone-Acetaminophen] Other (See Comments)    [name] it makes her crazy    Current Outpatient Prescriptions  Medication Sig Dispense Refill  . albuterol (PROVENTIL) (2.5 MG/3ML) 0.083% nebulizer solution Take 3 mLs (2.5 mg total) by nebulization every 6 (six) hours as needed for wheezing or shortness of breath. 75 mL 12  . amLODipine (NORVASC) 5 MG tablet TAKE 1 TABLET BY MOUTH EVERY DAY, MUST MAKE OFFICE VISIT 30 tablet 11  . Ascorbic Acid (VITAMIN C) 1000 MG tablet Take 1,000 mg by mouth daily.    . B Complex Vitamins (VITAMIN B COMPLEX) TABS Take 1 tablet by mouth daily.    . Biotin 300 MCG TABS Take 1 tablet by mouth daily.    Marland Kitchen CALCIUM CARBONATE PO Take 1 tablet by mouth daily. Patient states medication is calcium 1200 mg without vitamin D.    . cholecalciferol (VITAMIN D) 1000 UNITS tablet Take 1,000 Units by mouth daily.    . CYANOCOBALAMIN PO Take 1 tablet by mouth daily. Patient unsure of strength    . Ferrous Gluconate (IRON) 240 (27 FE) MG TABS Take 1 tablet by mouth daily.    . Flaxseed, Linseed, (FLAXSEED OIL) 1000 MG CAPS Take 1 capsule by mouth daily.     . furosemide (LASIX) 40 MG tablet Take 1 tab (40mg ) by mouth every morning & 1/2 tab (20mg ) every evening    . Lecithin 1200 MG CAPS Take 1 capsule by mouth daily.     Marland Kitchen levothyroxine (SYNTHROID,  LEVOTHROID) 50 MCG tablet Take 1 tablet by mouth daily.    . LUTEIN PO Take 1 capsule by mouth daily. Strength varies based on what is purchased at the time.    . Magnesium 250 MG TABS Take 1 tablet by mouth daily.    . metoprolol tartrate (LOPRESSOR) 25 MG tablet Take 12.5 mg by mouth 2 (two) times daily.    . Nebulizers (COMPRESSOR/NEBULIZER) MISC Use as directed 1 each 0  . Omega-3 Fatty Acids (FISH OIL) 1200 MG CAPS Take 1,200 mg by mouth daily.    . potassium chloride (K-DUR) 10 MEQ tablet Take 10 mEq by mouth 2 (two) times daily.    . vitamin E 400 UNIT capsule Take 400 Units by mouth daily.    Marland Kitchen warfarin (COUMADIN) 4 MG tablet Take 1 tablet by mouth daily. Or as directed based on labs    . ZETIA 10 MG tablet Take 1 tablet by mouth Daily.    . Zinc 50 MG TABS Take 1 tablet by mouth daily.     No current facility-administered medications for this visit.     Past Medical History:  Diagnosis Date  . Adenomatous colon polyp 2008  . Asthma   . CVA (cerebral vascular accident) ([place])   . Diverticulosis   . Hemorrhoids 2007  . HTN (hypertension)   . Hyperplastic colon polyp 2005  .  Hypothyroidism   . Narcolepsy   . TIA (transient ischemic attack)     Past Surgical History:  Procedure Laterality Date  . ABDOMINAL HYSTERECTOMY     age 35, complicated by poor wound healing, followed by revision of scar and radiation for ?malignancy  . BREAST ENHANCEMENT SURGERY    . COLONOSCOPY  2005   2 hyperplastic polyps  . COLONOSCOPY  2007   Dr. Oneida Alar- hemorrhoids  . COLONOSCOPY  2008   Dr. Reed Pandy polyp- rare sigmoid diverticulosis, internal hemorrhoids  . COLONOSCOPY  12/01/2011   Procedure: COLONOSCOPY;  Surgeon: Danie Binder, MD;  [facility];  Service: Endoscopy;  Laterality: N/A;  12:30 PM  . ESOPHAGOGASTRODUODENOSCOPY (EGD) WITH ESOPHAGEAL DILATION N/A 04/15/2013   Procedure: ESOPHAGOGASTRODUODENOSCOPY (EGD) WITH ESOPHAGEAL DILATION;  Surgeon: Danie Binder,  MD;  [facility];  Service: Endoscopy;  Laterality: N/A;  11:45-moved to 12:30 Darius Bump to notify pt  . EYE SURGERY  2010  . EYE SURGERY    . right thumb surgery      Social History   Social History  . Marital status: Widowed    Spouse name: N/A  . Number of children: N/A  . Years of education: N/A   Occupational History  . Not on file.   Social History Main Topics  . Smoking status: Never Smoker  . Smokeless tobacco: Never Used  . Alcohol use No  . Drug use: No  . Sexual activity: No   Other Topics Concern  . Not on file   Social History Narrative  . No narrative on file     Vitals:   01/04/16 1117  BP: 132/80  Pulse: 67  SpO2: 97%  Weight: 138 lb (62.6 kg)  Height: 5\' 1"  (1.549 m)    PHYSICAL EXAM General: NAD HEENT: Normal. Neck: No JVD, no thyromegaly. Lungs: Clear to auscultation bilaterally with normal respiratory effort. CV: Nondisplaced PMI.  Regular rate and rhythm, normal S1/S2, no S3/S4, no murmur. No pretibial or periankle edema.    Abdomen: Soft, nontender, no distention.  Neurologic: Alert and oriented.  Psych: Normal affect. Skin: Normal. Musculoskeletal: No gross deformities.    ECG: Most recent ECG reviewed.      ASSESSMENT AND PLAN: 1. Chest pain: No further symptoms since BP under control. She underwent coronary angiography in 2008 which revealed nonobstructive disease. Lexiscan Cardiolite stress testing was normal in 08/2013. Echocardiogram demonstrated normal left ventricular systolic function and grade 2 diastolic function.   2. Essential hypertension: Well controlled on amlodipine 5 mg daily. No changes.  3. Tachycardia/palpitations: Will have her try an extra 12.5 mg of metoprolol for a total of 25 mg of metoprolol every morning and continue 12.5 mg every evening. I instructed her that should she become drowsy and fatigued, to go back to 12.5 mg in the morning. Prior event monitoring did not demonstrate any  supraventricular tachyarrhythmias. Symptoms correlated with sinus rhythm.   4. Leg swelling: Continue Lasix 40 mg q am and 20 mg q pm. She will use compression stockings when the weather is cooler.  Dispo: f/u 1 year.   Kate Sable, M.D., F.A.C.C.

## 2016-01-04 NOTE — Patient Instructions (Signed)
Your physician wants you to follow-up in: 1 Year with Dr. Bronson Ing.  You will receive a reminder letter in the mail two months in advance. If you don't receive a letter, please call our office to schedule the follow-up appointment.  Your physician has recommended you make the following change in your medication: Increase Lopressor to 25 mg in the Am and 12.5 in the PM   If you need a refill on your cardiac medications before your next appointment, please call your pharmacy.  Thank you for choosing [facility]!

## 2016-01-09 DIAGNOSIS — F339 Major depressive disorder, recurrent, unspecified: Secondary | ICD-10-CM | POA: Diagnosis not present

## 2016-01-09 DIAGNOSIS — I482 Chronic atrial fibrillation: Secondary | ICD-10-CM | POA: Diagnosis not present

## 2016-01-09 DIAGNOSIS — F411 Generalized anxiety disorder: Secondary | ICD-10-CM | POA: Diagnosis not present

## 2016-01-30 DIAGNOSIS — F411 Generalized anxiety disorder: Secondary | ICD-10-CM | POA: Diagnosis not present

## 2016-01-30 DIAGNOSIS — M542 Cervicalgia: Secondary | ICD-10-CM | POA: Diagnosis not present

## 2016-01-30 DIAGNOSIS — Z7901 Long term (current) use of anticoagulants: Secondary | ICD-10-CM | POA: Diagnosis not present

## 2016-01-30 DIAGNOSIS — I482 Chronic atrial fibrillation: Secondary | ICD-10-CM | POA: Diagnosis not present

## 2016-01-30 DIAGNOSIS — E119 Type 2 diabetes mellitus without complications: Secondary | ICD-10-CM | POA: Diagnosis not present

## 2016-01-30 DIAGNOSIS — F339 Major depressive disorder, recurrent, unspecified: Secondary | ICD-10-CM | POA: Diagnosis not present

## 2016-01-31 ENCOUNTER — Other Ambulatory Visit: Payer: Self-pay | Admitting: Cardiovascular Disease

## 2016-02-27 DIAGNOSIS — M542 Cervicalgia: Secondary | ICD-10-CM | POA: Diagnosis not present

## 2016-02-27 DIAGNOSIS — F339 Major depressive disorder, recurrent, unspecified: Secondary | ICD-10-CM | POA: Diagnosis not present

## 2016-02-27 DIAGNOSIS — F411 Generalized anxiety disorder: Secondary | ICD-10-CM | POA: Diagnosis not present

## 2016-02-27 DIAGNOSIS — I482 Chronic atrial fibrillation: Secondary | ICD-10-CM | POA: Diagnosis not present

## 2016-02-27 DIAGNOSIS — Z7901 Long term (current) use of anticoagulants: Secondary | ICD-10-CM | POA: Diagnosis not present

## 2016-02-27 DIAGNOSIS — Z6824 Body mass index (BMI) 24.0-24.9, adult: Secondary | ICD-10-CM | POA: Diagnosis not present

## 2016-03-05 DIAGNOSIS — S0591XS Unspecified injury of right eye and orbit, sequela: Secondary | ICD-10-CM | POA: Diagnosis not present

## 2016-03-05 DIAGNOSIS — Z961 Presence of intraocular lens: Secondary | ICD-10-CM | POA: Diagnosis not present

## 2016-03-05 DIAGNOSIS — H524 Presbyopia: Secondary | ICD-10-CM | POA: Diagnosis not present

## 2016-03-05 DIAGNOSIS — H52203 Unspecified astigmatism, bilateral: Secondary | ICD-10-CM | POA: Diagnosis not present

## 2016-05-29 DIAGNOSIS — T8549XA Other mechanical complication of breast prosthesis and implant, initial encounter: Secondary | ICD-10-CM | POA: Diagnosis not present

## 2016-05-29 DIAGNOSIS — T8544XA Capsular contracture of breast implant, initial encounter: Secondary | ICD-10-CM | POA: Diagnosis not present

## 2016-06-06 DIAGNOSIS — Z7901 Long term (current) use of anticoagulants: Secondary | ICD-10-CM | POA: Diagnosis not present

## 2016-06-06 DIAGNOSIS — I482 Chronic atrial fibrillation: Secondary | ICD-10-CM | POA: Diagnosis not present

## 2016-06-18 ENCOUNTER — Encounter (HOSPITAL_COMMUNITY)
Admission: RE | Admit: 2016-06-18 | Discharge: 2016-06-18 | Disposition: A | Discharge status: Home / Self Care | Payer: PPO | Source: Ambulatory Visit | Attending: Specialist | Admitting: Specialist

## 2016-06-18 ENCOUNTER — Encounter (HOSPITAL_BASED_OUTPATIENT_CLINIC_OR_DEPARTMENT_OTHER): Payer: Self-pay | Admitting: *Deleted

## 2016-06-18 DIAGNOSIS — Z01812 Encounter for preprocedural laboratory examination: Secondary | ICD-10-CM | POA: Insufficient documentation

## 2016-06-18 LAB — BASIC METABOLIC PANEL
Anion gap: 9 (ref 5–15)
BUN: 36 mg/dL — ABNORMAL HIGH (ref 6–20)
CO2: 31 mmol/L (ref 22–32)
CREATININE: 1.07 mg/dL — AB (ref 0.44–1.00)
Calcium: 9.4 mg/dL (ref 8.9–10.3)
Chloride: 101 mmol/L (ref 101–111)
GFR calc Af Amer: 56 mL/min — ABNORMAL LOW (ref >60)
GFR calc non Af Amer: 48 mL/min — ABNORMAL LOW (ref >60)
Glucose, Bld: 108 mg/dL — ABNORMAL HIGH (ref 65–99)
Potassium: 4 mmol/L (ref 3.5–5.1)
SODIUM: 141 mmol/L (ref 135–145)

## 2016-06-18 LAB — PROTIME-INR
INR: 0.93
PROTHROMBIN TIME: 12.5 s (ref 11.4–15.2)

## 2016-06-18 NOTE — Progress Notes (Signed)
Patient is on Coumadin for hx CVA. Her last dose was 06-09-16 , she works at [employer] and will get Bmet and PT/INR done there prior to surgery.

## 2016-06-22 NOTE — Anesthesia Preprocedure Evaluation (Addendum)
Anesthesia Evaluation  Patient identified by MRN, date of birth, ID band Patient awake    Reviewed: Allergy & Precautions, NPO status , Patient's Chart, lab work & pertinent test results  History of Anesthesia Complications (+) PONV and history of anesthetic complications  Airway Mallampati: II  TM Distance: >3 FB Neck ROM: Full    Dental  (+) Teeth Intact   Pulmonary neg shortness of breath, asthma , neg sleep apnea, COPD,  COPD inhaler, neg recent URI,    breath sounds clear to auscultation       Cardiovascular hypertension, Pt. on medications and Pt. on home beta blockers (-) angina(-) CHF (-) dysrhythmias  Rhythm:Regular  '15 ECHO: EF 60-65%, mild MR   Neuro/Psych TIACVA (R leg weakness), Residual Symptoms negative psych ROS   GI/Hepatic Neg liver ROS, GERD  ,  Endo/Other  Hypothyroidism   Renal/GU negative Renal ROS     Musculoskeletal   Abdominal   Peds  Hematology negative hematology ROS (+) Coumadin: last dose 06/10/15   Anesthesia Other Findings   Reproductive/Obstetrics                            Anesthesia Physical Anesthesia Plan  ASA: III  Anesthesia Plan: General   Post-op Pain Management:    Induction: Intravenous  Airway Management Planned: Oral ETT  Additional Equipment: None  Intra-op Plan:   Post-operative Plan: Extubation in OR  Informed Consent: I have reviewed the patients History and Physical, chart, labs and discussed the procedure including the risks, benefits and alternatives for the proposed anesthesia with the patient or authorized representative who has indicated his/her understanding and acceptance.   Dental advisory given  Plan Discussed with: CRNA and Surgeon  Anesthesia Plan Comments:        Anesthesia Quick Evaluation

## 2016-06-23 ENCOUNTER — Ambulatory Visit (HOSPITAL_BASED_OUTPATIENT_CLINIC_OR_DEPARTMENT_OTHER)
Admission: RE | Admit: 2016-06-23 | Discharge: 2016-06-24 | Disposition: A | Discharge status: Home / Self Care | Payer: PPO | Source: Ambulatory Visit | Attending: Specialist | Admitting: Specialist

## 2016-06-23 ENCOUNTER — Ambulatory Visit (HOSPITAL_BASED_OUTPATIENT_CLINIC_OR_DEPARTMENT_OTHER): Payer: PPO | Admitting: Anesthesiology

## 2016-06-23 ENCOUNTER — Encounter (HOSPITAL_BASED_OUTPATIENT_CLINIC_OR_DEPARTMENT_OTHER)
Admission: RE | Disposition: A | Discharge status: Home / Self Care | Payer: Self-pay | Source: Ambulatory Visit | Attending: Specialist

## 2016-06-23 ENCOUNTER — Encounter (HOSPITAL_BASED_OUTPATIENT_CLINIC_OR_DEPARTMENT_OTHER): Payer: Self-pay | Admitting: Anesthesiology

## 2016-06-23 DIAGNOSIS — E039 Hypothyroidism, unspecified: Secondary | ICD-10-CM | POA: Diagnosis not present

## 2016-06-23 DIAGNOSIS — R079 Chest pain, unspecified: Secondary | ICD-10-CM | POA: Diagnosis not present

## 2016-06-23 DIAGNOSIS — R194 Change in bowel habit: Secondary | ICD-10-CM | POA: Diagnosis not present

## 2016-06-23 DIAGNOSIS — Z7901 Long term (current) use of anticoagulants: Secondary | ICD-10-CM | POA: Diagnosis not present

## 2016-06-23 DIAGNOSIS — Y831 Surgical operation with implant of artificial internal device as the cause of abnormal reaction of the patient, or of later complication, without mention of misadventure at the time of the procedure: Secondary | ICD-10-CM | POA: Insufficient documentation

## 2016-06-23 DIAGNOSIS — J449 Chronic obstructive pulmonary disease, unspecified: Secondary | ICD-10-CM | POA: Insufficient documentation

## 2016-06-23 DIAGNOSIS — I69351 Hemiplegia and hemiparesis following cerebral infarction affecting right dominant side: Secondary | ICD-10-CM | POA: Insufficient documentation

## 2016-06-23 DIAGNOSIS — I10 Essential (primary) hypertension: Secondary | ICD-10-CM | POA: Insufficient documentation

## 2016-06-23 DIAGNOSIS — T85898A Other specified complication of other internal prosthetic devices, implants and grafts, initial encounter: Secondary | ICD-10-CM | POA: Diagnosis not present

## 2016-06-23 DIAGNOSIS — Z79899 Other long term (current) drug therapy: Secondary | ICD-10-CM | POA: Diagnosis not present

## 2016-06-23 DIAGNOSIS — C50919 Malignant neoplasm of unspecified site of unspecified female breast: Secondary | ICD-10-CM | POA: Diagnosis present

## 2016-06-23 DIAGNOSIS — T8544XA Capsular contracture of breast implant, initial encounter: Secondary | ICD-10-CM | POA: Diagnosis not present

## 2016-06-23 DIAGNOSIS — Z45811 Encounter for adjustment or removal of right breast implant: Secondary | ICD-10-CM | POA: Diagnosis not present

## 2016-06-23 DIAGNOSIS — R05 Cough: Secondary | ICD-10-CM | POA: Diagnosis not present

## 2016-06-23 DIAGNOSIS — T8549XA Other mechanical complication of breast prosthesis and implant, initial encounter: Secondary | ICD-10-CM | POA: Diagnosis not present

## 2016-06-23 HISTORY — DX: Other specified postprocedural states: Z98.890

## 2016-06-23 HISTORY — DX: Other specified postprocedural states: R11.2

## 2016-06-23 HISTORY — PX: BREAST IMPLANT EXCHANGE: SHX6296

## 2016-06-23 HISTORY — DX: Adverse effect of unspecified anesthetic, initial encounter: T41.45XA

## 2016-06-23 HISTORY — DX: Other complications of anesthesia, initial encounter: T88.59XA

## 2016-06-23 HISTORY — DX: Gastro-esophageal reflux disease without esophagitis: K21.9

## 2016-06-23 SURGERY — REPLACEMENT, IMPLANT, BREAST
Anesthesia: General | Site: Breast | Laterality: Right

## 2016-06-23 MED ORDER — PHENYLEPHRINE HCL 10 MG/ML IJ SOLN
INTRAMUSCULAR | Status: DC | PRN
  Administered 2016-06-23: 40 ug via INTRAVENOUS

## 2016-06-23 MED ORDER — MORPHINE SULFATE (PF) 2 MG/ML IV SOLN
1.0000 mg | INTRAVENOUS | Status: DC | PRN
  Administered 2016-06-24: 1 mg via INTRAVENOUS

## 2016-06-23 MED ORDER — SUCCINYLCHOLINE CHLORIDE 20 MG/ML IJ SOLN
INTRAMUSCULAR | Status: DC | PRN
  Administered 2016-06-23: 60 mg via INTRAVENOUS

## 2016-06-23 MED ORDER — FENTANYL CITRATE (PF) 100 MCG/2ML IJ SOLN
25.0000 ug | INTRAMUSCULAR | Status: DC | PRN
Start: 2016-06-23 — End: 2016-06-24
  Administered 2016-06-23 (×2): 25 ug via INTRAVENOUS
  Administered 2016-06-23: 50 ug via INTRAVENOUS
  Administered 2016-06-23: 25 ug via INTRAVENOUS

## 2016-06-23 MED ORDER — LIDOCAINE HCL (PF) 0.5 % IJ SOLN
INTRAMUSCULAR | Status: DC | PRN
  Administered 2016-06-23: 6 mL

## 2016-06-23 MED ORDER — IBUPROFEN 600 MG PO TABS
600.0000 mg | ORAL_TABLET | Freq: Four times a day (QID) | ORAL | Status: DC | PRN
  Administered 2016-06-23 (×2): 600 mg via ORAL
  Filled 2016-06-23 (×2): qty 1

## 2016-06-23 MED ORDER — CEFAZOLIN SODIUM 1 G IJ SOLR
INTRAMUSCULAR | Status: AC
  Filled 2016-06-23: qty 10

## 2016-06-23 MED ORDER — DEXTROSE IN LACTATED RINGERS 5 % IV SOLN
INTRAVENOUS | Status: DC
  Administered 2016-06-23 (×2): via INTRAVENOUS

## 2016-06-23 MED ORDER — OXYCODONE HCL 5 MG/5ML PO SOLN: 5.0000 mg | Freq: Once | ORAL | Status: DC | PRN

## 2016-06-23 MED ORDER — LIDOCAINE 2% (20 MG/ML) 5 ML SYRINGE
INTRAMUSCULAR | Status: AC
  Filled 2016-06-23: qty 5

## 2016-06-23 MED ORDER — SODIUM CHLORIDE 0.9 % IV SOLN
INTRAVENOUS | Status: DC | PRN
  Administered 2016-06-23: 450 mL

## 2016-06-23 MED ORDER — SUCCINYLCHOLINE CHLORIDE 200 MG/10ML IV SOSY
PREFILLED_SYRINGE | INTRAVENOUS | Status: AC
  Filled 2016-06-23: qty 10

## 2016-06-23 MED ORDER — FENTANYL CITRATE (PF) 100 MCG/2ML IJ SOLN
INTRAMUSCULAR | Status: DC | PRN
Start: 2016-06-23 — End: 2016-06-23
  Administered 2016-06-23 (×2): 50 ug via INTRAVENOUS

## 2016-06-23 MED ORDER — EPHEDRINE 5 MG/ML INJ
INTRAVENOUS | Status: AC
  Filled 2016-06-23: qty 10

## 2016-06-23 MED ORDER — DEXAMETHASONE SODIUM PHOSPHATE 10 MG/ML IJ SOLN
INTRAMUSCULAR | Status: AC
  Filled 2016-06-23: qty 1

## 2016-06-23 MED ORDER — BUPIVACAINE-EPINEPHRINE (PF) 0.5% -1:200000 IJ SOLN
INTRAMUSCULAR | Status: AC
  Filled 2016-06-23: qty 60

## 2016-06-23 MED ORDER — PROPOFOL 10 MG/ML IV BOLUS
INTRAVENOUS | Status: AC
  Filled 2016-06-23: qty 20

## 2016-06-23 MED ORDER — OXYCODONE HCL 5 MG PO TABS
5.0000 mg | ORAL_TABLET | ORAL | Status: DC | PRN
  Administered 2016-06-23 (×3): 5 mg via ORAL
  Filled 2016-06-23 (×2): qty 1

## 2016-06-23 MED ORDER — CEFAZOLIN SODIUM-DEXTROSE 2-4 GM/100ML-% IV SOLN
INTRAVENOUS | Status: AC
  Filled 2016-06-23: qty 100

## 2016-06-23 MED ORDER — LIDOCAINE HCL (PF) 0.5 % IJ SOLN
INTRAMUSCULAR | Status: AC
  Filled 2016-06-23: qty 50

## 2016-06-23 MED ORDER — CEFAZOLIN SODIUM-DEXTROSE 2-4 GM/100ML-% IV SOLN
2.0000 g | INTRAVENOUS | Status: AC
  Administered 2016-06-23: 2 g via INTRAVENOUS

## 2016-06-23 MED ORDER — LACTATED RINGERS IV SOLN
INTRAVENOUS | Status: DC
  Administered 2016-06-23: 07:00:00 via INTRAVENOUS
  Administered 2016-06-23: 10 mL/h via INTRAVENOUS

## 2016-06-23 MED ORDER — OXYCODONE HCL 5 MG/5ML PO SOLN: 5.0000 mg | ORAL | Status: DC | PRN

## 2016-06-23 MED ORDER — FENTANYL CITRATE (PF) 100 MCG/2ML IJ SOLN: 50.0000 ug | INTRAMUSCULAR | Status: DC | PRN

## 2016-06-23 MED ORDER — CHLORHEXIDINE GLUCONATE CLOTH 2 % EX PADS: 6.0000 | MEDICATED_PAD | Freq: Once | CUTANEOUS | Status: DC

## 2016-06-23 MED ORDER — PROPOFOL 10 MG/ML IV BOLUS
INTRAVENOUS | Status: DC | PRN
  Administered 2016-06-23: 150 mg via INTRAVENOUS
  Administered 2016-06-23: 100 mg via INTRAVENOUS
  Administered 2016-06-23: 50 mg via INTRAVENOUS
  Administered 2016-06-23: 20 mg via INTRAVENOUS

## 2016-06-23 MED ORDER — FENTANYL CITRATE (PF) 100 MCG/2ML IJ SOLN
INTRAMUSCULAR | Status: AC
  Filled 2016-06-23: qty 2

## 2016-06-23 MED ORDER — MIDAZOLAM HCL 2 MG/2ML IJ SOLN
1.0000 mg | INTRAMUSCULAR | Status: DC | PRN
Start: 2016-06-23 — End: 2016-06-24

## 2016-06-23 MED ORDER — EPHEDRINE SULFATE 50 MG/ML IJ SOLN
INTRAMUSCULAR | Status: DC | PRN
  Administered 2016-06-23: 25 mg via INTRAVENOUS

## 2016-06-23 MED ORDER — SCOPOLAMINE 1 MG/3DAYS TD PT72: 1.0000 | MEDICATED_PATCH | Freq: Once | TRANSDERMAL | Status: DC | PRN

## 2016-06-23 MED ORDER — LIDOCAINE HCL (CARDIAC) 20 MG/ML IV SOLN
INTRAVENOUS | Status: DC | PRN
  Administered 2016-06-23: 20 mg via INTRAVENOUS
  Administered 2016-06-23: 30 mg via INTRAVENOUS

## 2016-06-23 MED ORDER — LACTATED RINGERS IV SOLN
INTRAVENOUS | Status: DC | PRN
  Administered 2016-06-23 (×2): via INTRAVENOUS

## 2016-06-23 MED ORDER — ONDANSETRON HCL 4 MG/2ML IJ SOLN
INTRAMUSCULAR | Status: AC
  Filled 2016-06-23: qty 2

## 2016-06-23 MED ORDER — CEFAZOLIN SODIUM-DEXTROSE 2-4 GM/100ML-% IV SOLN
2.0000 g | Freq: Three times a day (TID) | INTRAVENOUS | Status: DC
  Administered 2016-06-23 – 2016-06-24 (×3): 2 g via INTRAVENOUS
  Filled 2016-06-23 (×3): qty 100

## 2016-06-23 MED ORDER — OXYCODONE HCL 5 MG PO TABS
5.0000 mg | ORAL_TABLET | Freq: Once | ORAL | Status: DC | PRN
  Filled 2016-06-23: qty 1

## 2016-06-23 MED ORDER — DEXAMETHASONE SODIUM PHOSPHATE 4 MG/ML IJ SOLN
INTRAMUSCULAR | Status: DC | PRN
  Administered 2016-06-23: 10 mg via INTRAVENOUS

## 2016-06-23 MED ORDER — GLYCOPYRROLATE 0.2 MG/ML IJ SOLN
INTRAMUSCULAR | Status: DC | PRN
  Administered 2016-06-23: 0.2 mg via INTRAVENOUS

## 2016-06-23 SURGICAL SUPPLY — 67 items
BAG DECANTER FOR FLEXI CONT (MISCELLANEOUS) ×2 IMPLANT
BENZOIN TINCTURE PRP APPL 2/3 (GAUZE/BANDAGES/DRESSINGS) ×2 IMPLANT
BINDER BREAST LRG (GAUZE/BANDAGES/DRESSINGS) IMPLANT
BINDER BREAST MEDIUM (GAUZE/BANDAGES/DRESSINGS) ×2 IMPLANT
BINDER BREAST XLRG (GAUZE/BANDAGES/DRESSINGS) IMPLANT
BINDER BREAST XXLRG (GAUZE/BANDAGES/DRESSINGS) IMPLANT
BLADE KNIFE PERSONA 10 (BLADE) ×2 IMPLANT
BLADE KNIFE PERSONA 15 (BLADE) ×2 IMPLANT
CANISTER SUCT 1200ML W/VALVE (MISCELLANEOUS) ×2 IMPLANT
COVER BACK TABLE 60X90IN (DRAPES) ×2 IMPLANT
COVER MAYO STAND STRL (DRAPES) ×2 IMPLANT
DECANTER SPIKE VIAL GLASS SM (MISCELLANEOUS) IMPLANT
DRAIN CHANNEL 10M FLAT 3/4 FLT (DRAIN) IMPLANT
DRAIN PENROSE 1/4X12 LTX STRL (WOUND CARE) IMPLANT
DRAPE LAPAROSCOPIC ABDOMINAL (DRAPES) ×2 IMPLANT
DRSG PAD ABDOMINAL 8X10 ST (GAUZE/BANDAGES/DRESSINGS) ×4 IMPLANT
ELECT BLADE 6.5 .24CM SHAFT (ELECTRODE) IMPLANT
ELECT REM PT RETURN 9FT ADLT (ELECTROSURGICAL) ×2
ELECTRODE REM PT RTRN 9FT ADLT (ELECTROSURGICAL) ×1 IMPLANT
EVACUATOR SILICONE 100CC (DRAIN) IMPLANT
FILTER 7/8 IN (FILTER) ×2 IMPLANT
GAUZE SPONGE 4X4 12PLY STRL (GAUZE/BANDAGES/DRESSINGS) ×2 IMPLANT
GAUZE XEROFORM 5X9 LF (GAUZE/BANDAGES/DRESSINGS) ×2 IMPLANT
GLOVE BIO SURGEON STRL SZ 6.5 (GLOVE) ×4 IMPLANT
GLOVE BIOGEL M STRL SZ7.5 (GLOVE) ×2 IMPLANT
GLOVE BIOGEL PI IND STRL 7.0 (GLOVE) ×1 IMPLANT
GLOVE BIOGEL PI IND STRL 8 (GLOVE) ×1 IMPLANT
GLOVE BIOGEL PI INDICATOR 7.0 (GLOVE) ×1
GLOVE BIOGEL PI INDICATOR 8 (GLOVE) ×1
GLOVE ECLIPSE 7.0 STRL STRAW (GLOVE) ×2 IMPLANT
GOWN STRL REUS W/TWL XL LVL3 (GOWN DISPOSABLE) ×4 IMPLANT
IMPL BREAST MP 425CC (Breast) ×1 IMPLANT
IMPLANT BREAST MP 425CC (Breast) ×2 IMPLANT
IV NS 500ML (IV SOLUTION) ×4
IV NS 500ML BAXH (IV SOLUTION) ×4 IMPLANT
KIT FILL SYSTEM UNIVERSAL (SET/KITS/TRAYS/PACK) IMPLANT
MARKER SKIN DUAL TIP RULER LAB (MISCELLANEOUS) IMPLANT
NDL SAFETY ECLIPSE 18X1.5 (NEEDLE) ×1 IMPLANT
NEEDLE HYPO 18GX1.5 SHARP (NEEDLE) ×1
NEEDLE HYPO 25X1 1.5 SAFETY (NEEDLE) ×2 IMPLANT
NEEDLE SPNL 18GX3.5 QUINCKE PK (NEEDLE) IMPLANT
NS IRRIG 1000ML POUR BTL (IV SOLUTION) ×2 IMPLANT
PACK BASIN DAY SURGERY FS (CUSTOM PROCEDURE TRAY) ×2 IMPLANT
PEN SKIN MARKING BROAD TIP (MISCELLANEOUS) IMPLANT
PIN SAFETY STERILE (MISCELLANEOUS) IMPLANT
SET ASEPTIC TRANSFER (MISCELLANEOUS) ×2 IMPLANT
SLEEVE SCD COMPRESS KNEE MED (MISCELLANEOUS) ×2 IMPLANT
SPONGE LAP 18X18 X RAY DECT (DISPOSABLE) ×4 IMPLANT
STRIP SUTURE WOUND CLOSURE 1/2 (SUTURE) IMPLANT
SUT MNCRL AB 3-0 PS2 18 (SUTURE) ×2 IMPLANT
SUT MON AB 2-0 CT1 36 (SUTURE) ×4 IMPLANT
SUT MON AB 5-0 PS2 18 (SUTURE) IMPLANT
SUT PROLENE 3 0 PS 2 (SUTURE) IMPLANT
SUT VLOC 180 0 24IN GS25 (SUTURE) IMPLANT
SYR 20CC LL (SYRINGE) ×2 IMPLANT
SYR 50ML LL SCALE MARK (SYRINGE) ×2 IMPLANT
SYR BULB IRRIGATION 50ML (SYRINGE) ×2 IMPLANT
SYR CONTROL 10ML LL (SYRINGE) ×2 IMPLANT
TAPE HYPAFIX 6X30 (GAUZE/BANDAGES/DRESSINGS) ×2 IMPLANT
TAPE MEASURE 72IN RETRACT (INSTRUMENTS)
TAPE MEASURE LINEN 72IN RETRCT (INSTRUMENTS) IMPLANT
TOWEL OR 17X24 6PK STRL BLUE (TOWEL DISPOSABLE) ×6 IMPLANT
TRAY DSU PREP LF (CUSTOM PROCEDURE TRAY) ×2 IMPLANT
TUBE CONNECTING 20X1/4 (TUBING) ×2 IMPLANT
UNDERPAD 30X30 (UNDERPADS AND DIAPERS) ×2 IMPLANT
VAC PENCILS W/TUBING CLEAR (MISCELLANEOUS) ×2 IMPLANT
YANKAUER SUCT BULB TIP NO VENT (SUCTIONS) ×2 IMPLANT

## 2016-06-23 NOTE — Anesthesia Procedure Notes (Signed)
Procedure Name: Intubation Date/Time: 06/23/2016 7:40 AM Performed by: Marrianne Mood Pre-anesthesia Checklist: Patient identified, Emergency Drugs available, Suction available, Patient being monitored and Timeout performed Patient Re-evaluated:Patient Re-evaluated prior to inductionOxygen Delivery Method: Circle system utilized Preoxygenation: Pre-oxygenation with 100% oxygen Intubation Type: IV induction Ventilation: Mask ventilation without difficulty Laryngoscope Size: Miller and 3 Tube type: Oral Tube size: 7.0 mm Number of attempts: 1 Airway Equipment and Method: Stylet and Oral airway Placement Confirmation: ETT inserted through vocal cords under direct vision,  positive ETCO2 and breath sounds checked- equal and bilateral Secured at: 20 cm Tube secured with: Tape Dental Injury: Teeth and Oropharynx as per pre-operative assessment  Difficulty Due To: Difficult Airway- due to limited oral opening and Difficult Airway- due to reduced neck mobility

## 2016-06-23 NOTE — Brief Op Note (Signed)
06/23/2016  8:46 AM  PATIENT:  [name]  81 y.o. female  PRE-OPERATIVE DIAGNOSIS:  deflation of right breast implant  POST-OPERATIVE DIAGNOSIS:  deflation of right breast implant  PROCEDURE:  Procedure(s): RIGHT BREAST IMPLANT REMOVAL AND REPLACEMENT (Right)  SURGEON:  Surgeon(s) and Role:    * Cristine Polio, MD - Primary  PHYSICIAN ASSISTANT:   ASSISTANTS: none   ANESTHESIA:   general  EBL:  Total I/O In: 1500 [I.V.:1500] Out: 12 [Blood:12]  BLOOD ADMINISTERED:none  DRAINS: (bbb) Jackson-Pratt drain(s) with closed bulb suction in the right lateral breast areas   LOCAL MEDICATIONS USED:  LIDOCAINE   SPECIMEN:  Excision  DISPOSITION OF SPECIMEN:  PATHOLOGY  COUNTS:  YES  TOURNIQUET:  * No tourniquets in log *  DICTATION: .Other Dictation: Dictation Number [id number]  PLAN OF CARE: Admit for overnight observation  PATIENT DISPOSITION:  PACU - hemodynamically stable.   Delay start of Pharmacological VTE agent (>24hrs) due to surgical blood loss or risk of bleeding: yes

## 2016-06-23 NOTE — Anesthesia Postprocedure Evaluation (Addendum)
Anesthesia Post Note  Patient: [name]  Procedure(s) Performed: Procedure(s) (LRB): RIGHT BREAST IMPLANT REMOVAL AND REPLACEMENT (Right)  Patient location during evaluation: PACU Anesthesia Type: General Level of consciousness: awake and alert, patient cooperative and oriented Pain management: pain level controlled (pain improving) Vital Signs Assessment: post-procedure vital signs reviewed and stable Respiratory status: spontaneous breathing, nonlabored ventilation, respiratory function stable and patient connected to nasal cannula oxygen Cardiovascular status: blood pressure returned to baseline and stable Postop Assessment: no signs of nausea or vomiting Anesthetic complications: no       Last Vitals:  Vitals:   06/23/16 1045 06/23/16 1130  BP: 114/68 100/62  Pulse: 75 77  Resp: 13 18  Temp: 36.4 C 36.1 C    Last Pain:  Vitals:   06/23/16 1126  TempSrc:   PainSc: 4                  Sarai January,E. Deziyah Arvin

## 2016-06-23 NOTE — H&P (Signed)
[name] is an 81 y.o. female.   Chief Complaint: Rupture implant right breast HPI: SP RECONSRUCTIONS IN THE PAST  hAD PREVious rupture on the left side requiring over 9 reconstructive procedures  Past Medical History:  Diagnosis Date  . Adenomatous colon polyp 2008  . Asthma   . Complication of anesthesia   . CVA (cerebral vascular accident) ([place])    sl weaker on rt foot  . Diverticulosis   . GERD (gastroesophageal reflux disease)   . Hemorrhoids 2007  . HTN (hypertension)   . Hyperplastic colon polyp 2005  . Hypothyroidism   . Narcolepsy   . PONV (postoperative nausea and vomiting)   . TIA (transient ischemic attack)     Past Surgical History:  Procedure Laterality Date  . ABDOMINAL HYSTERECTOMY     age 30, complicated by poor wound healing, followed by revision of scar and radiation for ?malignancy  . BREAST ENHANCEMENT SURGERY    . COLONOSCOPY  2005   2 hyperplastic polyps  . COLONOSCOPY  2007   Dr. Oneida Alar- hemorrhoids  . COLONOSCOPY  2008   Dr. Reed Pandy polyp- rare sigmoid diverticulosis, internal hemorrhoids  . COLONOSCOPY  12/01/2011   Procedure: COLONOSCOPY;  Surgeon: Danie Binder, MD;  Location: [facility];  Service: Endoscopy;  Laterality: N/A;  12:30 PM  . ESOPHAGOGASTRODUODENOSCOPY (EGD) WITH ESOPHAGEAL DILATION N/A 04/15/2013   Procedure: ESOPHAGOGASTRODUODENOSCOPY (EGD) WITH ESOPHAGEAL DILATION;  Surgeon: Danie Binder, MD;  Location: [facility];  Service: Endoscopy;  Laterality: N/A;  11:45-moved to 12:30 Darius Bump to notify pt  . EYE SURGERY  2010  . EYE SURGERY    . right thumb surgery      Family History  Problem Relation Age of Onset  . Colon cancer Father     age 6  . Prostate cancer Father   . Pancreatic cancer Mother     age 76   Social History:  reports that she has never smoked. She has never used smokeless tobacco. She reports that she does not drink alcohol or use drugs.  Allergies:  Allergies  Allergen  Reactions  . Epinephrine Anaphylaxis  . Demerol [Meperidine] Other (See Comments)    headaches  . Vicodin [Hydrocodone-Acetaminophen] Other (See Comments)    [name] it makes her crazy    Medications Prior to Admission  Medication Sig Dispense Refill  . amLODipine (NORVASC) 5 MG tablet TAKE 1 TABLET BY MOUTH EVERY DAY 30 tablet 6  . Ascorbic Acid (VITAMIN C) 1000 MG tablet Take 1,000 mg by mouth daily.    . B Complex Vitamins (VITAMIN B COMPLEX) TABS Take 1 tablet by mouth daily.    . Biotin 300 MCG TABS Take 1 tablet by mouth daily.    Marland Kitchen CALCIUM CARBONATE PO Take 1 tablet by mouth daily. Patient states medication is calcium 1200 mg without vitamin D.    . cholecalciferol (VITAMIN D) 1000 UNITS tablet Take 1,000 Units by mouth daily.    . CYANOCOBALAMIN PO Take 1 tablet by mouth daily. Patient unsure of strength    . ezetimibe (ZETIA) 10 MG tablet Take 10 mg by mouth daily.    . Ferrous Gluconate (IRON) 240 (27 FE) MG TABS Take 1 tablet by mouth daily.    . Flaxseed, Linseed, (FLAXSEED OIL) 1000 MG CAPS Take 1 capsule by mouth daily.     . furosemide (LASIX) 40 MG tablet Take 1 tab (40mg ) by mouth every morning & 1/2 tab (20mg ) every evening    .  levothyroxine (SYNTHROID, LEVOTHROID) 50 MCG tablet Take 1 tablet by mouth daily.    . LUTEIN PO Take 1 capsule by mouth daily. Strength varies based on what is purchased at the time.    . Magnesium 250 MG TABS Take 1 tablet by mouth daily.    . metoprolol tartrate (LOPRESSOR) 25 MG tablet Take 25 mg (1 Tablet) in the AM and 12.5 (1/2 Tablet)  in the PM 36 tablet 11  . Omega-3 Fatty Acids (FISH OIL) 1200 MG CAPS Take 1,200 mg by mouth daily.    . potassium chloride (K-DUR) 10 MEQ tablet Take 10 mEq by mouth 2 (two) times daily.    . vitamin E 400 UNIT capsule Take 400 Units by mouth daily.    . Zinc 50 MG TABS Take 1 tablet by mouth daily.    Marland Kitchen albuterol (PROVENTIL) (2.5 MG/3ML) 0.083% nebulizer solution Take 3 mLs (2.5 mg total) by nebulization  every 6 (six) hours as needed for wheezing or shortness of breath. 75 mL 12  . Nebulizers (COMPRESSOR/NEBULIZER) MISC Use as directed 1 each 0  . warfarin (COUMADIN) 4 MG tablet Take 1 tablet by mouth daily. Or as directed based on labs      No results found for this or any previous visit (from the past 48 hour(s)). No results found.  Review of Systems  Constitutional: Negative.   HENT: Negative.   Eyes: Negative.   Respiratory: Negative.   Cardiovascular: Negative.   Gastrointestinal: Negative.   Genitourinary: Negative.   Musculoskeletal: Positive for neck pain.  Skin: Negative.     Height 5\' 1"  (1.549 m), weight 62.6 kg (138 lb). Physical Exam   Assessment/Plan Rupture implant right side for removal and replacement  [name] L, MD 06/23/2016, 6:41 AM

## 2016-06-23 NOTE — Transfer of Care (Signed)
Immediate Anesthesia Transfer of Care Note  Patient: [name]  Procedure(s) Performed: Procedure(s): RIGHT BREAST IMPLANT REMOVAL AND REPLACEMENT (Right)  Patient Location: PACU  Anesthesia Type:General  Level of Consciousness: sedated  Airway & Oxygen Therapy: Patient Spontanous Breathing and Patient connected to face mask oxygen  Post-op Assessment: Report given to RN and Post -op Vital signs reviewed and stable  Post vital signs: Reviewed and stable  Last Vitals:  Vitals:   06/23/16 0704  BP: (!) 145/68  Pulse: (!) 52  Resp: 18  Temp: 36.6 C    Last Pain:  Vitals:   06/23/16 0704  TempSrc: Oral         Complications: No apparent anesthesia complications

## 2016-06-24 ENCOUNTER — Encounter (HOSPITAL_BASED_OUTPATIENT_CLINIC_OR_DEPARTMENT_OTHER): Payer: Self-pay | Admitting: Specialist

## 2016-06-24 DIAGNOSIS — T8549XA Other mechanical complication of breast prosthesis and implant, initial encounter: Secondary | ICD-10-CM | POA: Diagnosis not present

## 2016-06-24 MED ORDER — MORPHINE SULFATE (PF) 4 MG/ML IV SOLN
INTRAVENOUS | Status: AC
  Filled 2016-06-24: qty 1

## 2016-06-24 NOTE — Op Note (Signed)
NAME:  [name], [name]               ACCOUNT NO.:  MEDICAL RECORD NO.:  [id number]  LOCATION:                                 FACILITY:  PHYSICIAN:  Terea Neubauer L. Towanda Malkin, M.D.   DATE OF BIRTH:  DATE OF PROCEDURE:  06/23/2016 DATE OF DISCHARGE:                              OPERATIVE REPORT   INDICATION:  This is a 81 year old lady, who had reconstructive surgeries done in the past.  She has had severe situations with scar contracture and irritations and ruptures.  She has a rupture on the right side.  This caused pain and discomfort with severe contracture on that side.  The patient had a surgery done in [place], is not sure of the title and the make of the implant.  PROCEDURES DONE:  Exploration of right breast, excision of severe capsule formation class 4 circumferentially.  Removal of deflated implant.  Reconstruction with mentor implant, saline type, reference [id number], structure [id number].  Preoperatively the patient underwent drawings for the areas.  She underwent general anesthesia, intubated orally, prep was done to the chest, breast areas in routine fashion using Hibiclens, soap and solution walled off with sterile towels and drapes, so as to make a sterile field.  The previous inframammary incision was re-approached with a 15 blade down to skin and subcutaneous tissue.  Dissection was carried out delicately.  The patient is severely allergic to epinephrine.  Hemostasis maintained with Bovie and anticoagulation throughout the procedure, dissection carried down to the capsule.  The capsule was elevated previously circumferentially using Metzenbaum scissors and lighted retractor.  I was then able to remove it with the ruptured implant within it.  Hemostasis was maintained with the Bovie throughout the area of the dissection and then this placed in field with a Mentor smooth moderate profile implant, style 1600, total of 500 mL and good contour.  The wounds were  irrigated and a #10 fully fluted Blake drain was placed in the depths of the wound, brought out through the lateral portion incision secured with 3-0 Prolene.  The muscle and subcutaneous tissue were closed with 2-0 Monocryl x2 layers and then a running subcuticular stitch of 3-0 Monocryl.  Steri-Strips and soft dressing were applied on all areas.  She withstood procedure very well.  Sterile dressing applied.     Odella Aquas. Towanda Malkin, M.D.     Elie Confer  D:  06/23/2016  T:  06/24/2016  Job:  [id number]

## 2016-06-24 NOTE — Discharge Instructions (Signed)
Activity (include date of return to work if known) As tolerated: NO showers NO driving No heavy activities  Diet:regular No restrictions:  Wound Care: Keep dressing clean & dry  Do not change dressings For Abdominoplasties wear abdominal binder Special Instructions: Do not raise arms up  About my Jackson-Pratt Bulb Drain  What is a Jackson-Pratt bulb? A Jackson-Pratt is a soft, round device used to collect drainage. It is connected to a long, thin drainage catheter, which is held in place by one or two small stiches near your surgical incision site. When the bulb is squeezed, it forms a vacuum, forcing the drainage to empty into the bulb.  Emptying the Jackson-Pratt bulb- To empty the bulb: 1. Release the plug on the top of the bulb. 2. Pour the bulb's contents into a measuring container which your nurse will provide. 3. Record the time emptied and amount of drainage. Empty the drain(s) as often as your     doctor or nurse recommends.  Date                  Time                    Amount (Drain 1)                 Amount (Drain 2)  _____________________________________________________________________  _____________________________________________________________________  _____________________________________________________________________  _____________________________________________________________________  _____________________________________________________________________  _____________________________________________________________________  _____________________________________________________________________  _____________________________________________________________________  Squeezing the Jackson-Pratt Bulb- To squeeze the bulb: 1. Make sure the plug at the top of the bulb is open. 2. Squeeze the bulb tightly in your fist. You will hear air squeezing from the bulb. 3. Replace the plug while the bulb is squeezed. 4. Use a safety pin to attach the bulb to  your clothing. This will keep the catheter from     pulling at the bulb insertion site.  When to call your doctor- Call your doctor if:  Drain site becomes red, swollen or hot.  You have a fever greater than 101 degrees F.  There is oozing at the drain site.  Drain falls out (apply a guaze bandage over the drain hole and secure it with tape).  Drainage increases daily not related to activity patterns. (You will usually have more drainage when you are active than when you are resting.)  Drainage has a bad odor.   Continue to empty, recharge, & record drainage from J-P drains &/or Hemovacs 2-3 times a day, as needed. Call Doctor if any unusual problems occur such as pain, excessive Bleeding, unrelieved Nausea/vomiting, Fever &/or chills When lying down, keep head elevated on 2-3 pillows or back-rest For Addominoplasties the Jack-knife position Follow-up appointment: Please call the office.  The patient received discharge instruction from:___________________________________________   Patient signature ________________________________________ / Date___________    Signature of individual providing instructions/ Date________________

## 2016-07-03 ENCOUNTER — Other Ambulatory Visit: Payer: Self-pay | Admitting: Cardiovascular Disease

## 2016-07-18 DIAGNOSIS — Z7901 Long term (current) use of anticoagulants: Secondary | ICD-10-CM | POA: Diagnosis not present

## 2016-07-18 DIAGNOSIS — I482 Chronic atrial fibrillation: Secondary | ICD-10-CM | POA: Diagnosis not present

## 2016-07-18 DIAGNOSIS — Z9886 Personal history of breast implant removal: Secondary | ICD-10-CM | POA: Diagnosis not present

## 2016-08-15 DIAGNOSIS — F438 Other reactions to severe stress: Secondary | ICD-10-CM | POA: Diagnosis not present

## 2016-08-15 DIAGNOSIS — L988 Other specified disorders of the skin and subcutaneous tissue: Secondary | ICD-10-CM | POA: Diagnosis not present

## 2016-08-15 DIAGNOSIS — N6459 Other signs and symptoms in breast: Secondary | ICD-10-CM | POA: Diagnosis not present

## 2016-08-15 DIAGNOSIS — I482 Chronic atrial fibrillation: Secondary | ICD-10-CM | POA: Diagnosis not present

## 2016-08-15 DIAGNOSIS — Z6823 Body mass index (BMI) 23.0-23.9, adult: Secondary | ICD-10-CM | POA: Diagnosis not present

## 2016-08-28 ENCOUNTER — Other Ambulatory Visit: Payer: Self-pay | Admitting: Cardiovascular Disease

## 2016-08-28 DIAGNOSIS — D485 Neoplasm of uncertain behavior of skin: Secondary | ICD-10-CM | POA: Diagnosis not present

## 2016-08-29 DIAGNOSIS — Z7901 Long term (current) use of anticoagulants: Secondary | ICD-10-CM | POA: Diagnosis not present

## 2016-08-29 DIAGNOSIS — K5900 Constipation, unspecified: Secondary | ICD-10-CM | POA: Diagnosis not present

## 2016-08-29 DIAGNOSIS — I482 Chronic atrial fibrillation: Secondary | ICD-10-CM | POA: Diagnosis not present

## 2016-08-29 DIAGNOSIS — F438 Other reactions to severe stress: Secondary | ICD-10-CM | POA: Diagnosis not present

## 2016-08-29 DIAGNOSIS — Z6823 Body mass index (BMI) 23.0-23.9, adult: Secondary | ICD-10-CM | POA: Diagnosis not present

## 2016-09-10 DIAGNOSIS — C50111 Malignant neoplasm of central portion of right female breast: Secondary | ICD-10-CM | POA: Diagnosis not present

## 2016-09-26 DIAGNOSIS — Z7901 Long term (current) use of anticoagulants: Secondary | ICD-10-CM | POA: Diagnosis not present

## 2016-09-26 DIAGNOSIS — Z6823 Body mass index (BMI) 23.0-23.9, adult: Secondary | ICD-10-CM | POA: Diagnosis not present

## 2016-09-26 DIAGNOSIS — M2669 Other specified disorders of temporomandibular joint: Secondary | ICD-10-CM | POA: Diagnosis not present

## 2016-09-26 DIAGNOSIS — I482 Chronic atrial fibrillation: Secondary | ICD-10-CM | POA: Diagnosis not present

## 2016-09-26 DIAGNOSIS — F339 Major depressive disorder, recurrent, unspecified: Secondary | ICD-10-CM | POA: Diagnosis not present

## 2016-09-26 DIAGNOSIS — R5383 Other fatigue: Secondary | ICD-10-CM | POA: Diagnosis not present

## 2016-10-06 DIAGNOSIS — E119 Type 2 diabetes mellitus without complications: Secondary | ICD-10-CM | POA: Diagnosis not present

## 2016-10-06 DIAGNOSIS — I10 Essential (primary) hypertension: Secondary | ICD-10-CM | POA: Diagnosis not present

## 2016-10-06 DIAGNOSIS — D509 Iron deficiency anemia, unspecified: Secondary | ICD-10-CM | POA: Diagnosis not present

## 2016-10-10 DIAGNOSIS — M542 Cervicalgia: Secondary | ICD-10-CM | POA: Diagnosis not present

## 2016-10-10 DIAGNOSIS — E039 Hypothyroidism, unspecified: Secondary | ICD-10-CM | POA: Diagnosis not present

## 2016-10-10 DIAGNOSIS — M8580 Other specified disorders of bone density and structure, unspecified site: Secondary | ICD-10-CM | POA: Diagnosis not present

## 2016-10-10 DIAGNOSIS — E782 Mixed hyperlipidemia: Secondary | ICD-10-CM | POA: Diagnosis not present

## 2016-10-10 DIAGNOSIS — M79604 Pain in right leg: Secondary | ICD-10-CM | POA: Diagnosis not present

## 2016-10-10 DIAGNOSIS — R944 Abnormal results of kidney function studies: Secondary | ICD-10-CM | POA: Diagnosis not present

## 2016-10-10 DIAGNOSIS — Z6824 Body mass index (BMI) 24.0-24.9, adult: Secondary | ICD-10-CM | POA: Diagnosis not present

## 2016-10-10 DIAGNOSIS — Z8673 Personal history of transient ischemic attack (TIA), and cerebral infarction without residual deficits: Secondary | ICD-10-CM | POA: Diagnosis not present

## 2016-10-10 DIAGNOSIS — D1801 Hemangioma of skin and subcutaneous tissue: Secondary | ICD-10-CM | POA: Diagnosis not present

## 2016-10-22 DIAGNOSIS — T8549XA Other mechanical complication of breast prosthesis and implant, initial encounter: Secondary | ICD-10-CM | POA: Diagnosis not present

## 2016-10-22 DIAGNOSIS — T8544XA Capsular contracture of breast implant, initial encounter: Secondary | ICD-10-CM | POA: Diagnosis not present

## 2016-10-28 ENCOUNTER — Encounter: Payer: Self-pay | Admitting: Gastroenterology

## 2016-11-03 ENCOUNTER — Other Ambulatory Visit: Payer: Self-pay | Admitting: Cardiovascular Disease

## 2016-11-07 DIAGNOSIS — I482 Chronic atrial fibrillation: Secondary | ICD-10-CM | POA: Diagnosis not present

## 2016-11-07 DIAGNOSIS — R079 Chest pain, unspecified: Secondary | ICD-10-CM | POA: Diagnosis not present

## 2016-11-07 DIAGNOSIS — Z7901 Long term (current) use of anticoagulants: Secondary | ICD-10-CM | POA: Diagnosis not present

## 2016-11-21 ENCOUNTER — Other Ambulatory Visit: Payer: Self-pay | Admitting: Cardiovascular Disease

## 2016-12-05 DIAGNOSIS — H04309 Unspecified dacryocystitis of unspecified lacrimal passage: Secondary | ICD-10-CM | POA: Diagnosis not present

## 2016-12-05 DIAGNOSIS — Z7901 Long term (current) use of anticoagulants: Secondary | ICD-10-CM | POA: Diagnosis not present

## 2016-12-08 NOTE — Addendum Note (Signed)
Addendum  created 12/08/16 1521 by Annye Asa, MD   Sign clinical note

## 2016-12-08 NOTE — Anesthesia Postprocedure Evaluation (Deleted)
Anesthesia Post Note  Patient: [name]  Procedure(s) Performed: Procedure(s) (LRB): RIGHT BREAST IMPLANT REMOVAL AND REPLACEMENT (Right)     Anesthesia Post Evaluation  Last Vitals:  Vitals:   06/24/16 0245 06/24/16 0745  BP: 128/68 122/62  Pulse: 68 64  Resp:  18  Temp:  36.3 C    Last Pain:  Vitals:   06/24/16 0930  TempSrc:   PainSc: 0-No pain                 Patra Gherardi,E. Dolce Sylvia

## 2016-12-08 NOTE — Addendum Note (Signed)
Addendum  created 12/08/16 1652 by Annye Asa, MD   Delete clinical note, Sign clinical note

## 2017-01-02 DIAGNOSIS — Z7901 Long term (current) use of anticoagulants: Secondary | ICD-10-CM | POA: Diagnosis not present

## 2017-01-02 DIAGNOSIS — Z6824 Body mass index (BMI) 24.0-24.9, adult: Secondary | ICD-10-CM | POA: Diagnosis not present

## 2017-01-02 DIAGNOSIS — R002 Palpitations: Secondary | ICD-10-CM | POA: Diagnosis not present

## 2017-01-02 DIAGNOSIS — I482 Chronic atrial fibrillation: Secondary | ICD-10-CM | POA: Diagnosis not present

## 2017-01-02 DIAGNOSIS — I10 Essential (primary) hypertension: Secondary | ICD-10-CM | POA: Diagnosis not present

## 2017-01-02 DIAGNOSIS — Z86010 Personal history of colonic polyps: Secondary | ICD-10-CM | POA: Diagnosis not present

## 2017-01-02 DIAGNOSIS — R000 Tachycardia, unspecified: Secondary | ICD-10-CM | POA: Diagnosis not present

## 2017-01-06 ENCOUNTER — Ambulatory Visit: Payer: PPO | Admitting: Cardiovascular Disease

## 2017-01-07 ENCOUNTER — Emergency Department (HOSPITAL_COMMUNITY): Payer: PRIVATE HEALTH INSURANCE

## 2017-01-07 ENCOUNTER — Emergency Department (HOSPITAL_COMMUNITY)
Admission: EM | Admit: 2017-01-07 | Discharge: 2017-01-07 | Disposition: A | Discharge status: Home / Self Care | Payer: PRIVATE HEALTH INSURANCE | Attending: Emergency Medicine | Admitting: Emergency Medicine

## 2017-01-07 ENCOUNTER — Encounter (HOSPITAL_COMMUNITY): Payer: Self-pay | Admitting: Cardiology

## 2017-01-07 DIAGNOSIS — Z853 Personal history of malignant neoplasm of breast: Secondary | ICD-10-CM | POA: Diagnosis not present

## 2017-01-07 DIAGNOSIS — W19XXXA Unspecified fall, initial encounter: Secondary | ICD-10-CM | POA: Insufficient documentation

## 2017-01-07 DIAGNOSIS — E039 Hypothyroidism, unspecified: Secondary | ICD-10-CM | POA: Diagnosis not present

## 2017-01-07 DIAGNOSIS — S5011XA Contusion of right forearm, initial encounter: Secondary | ICD-10-CM | POA: Diagnosis not present

## 2017-01-07 DIAGNOSIS — I10 Essential (primary) hypertension: Secondary | ICD-10-CM | POA: Diagnosis not present

## 2017-01-07 DIAGNOSIS — S40021A Contusion of right upper arm, initial encounter: Secondary | ICD-10-CM | POA: Diagnosis not present

## 2017-01-07 DIAGNOSIS — J45909 Unspecified asthma, uncomplicated: Secondary | ICD-10-CM | POA: Insufficient documentation

## 2017-01-07 DIAGNOSIS — Y939 Activity, unspecified: Secondary | ICD-10-CM | POA: Insufficient documentation

## 2017-01-07 DIAGNOSIS — Z79899 Other long term (current) drug therapy: Secondary | ICD-10-CM | POA: Insufficient documentation

## 2017-01-07 DIAGNOSIS — Y999 Unspecified external cause status: Secondary | ICD-10-CM | POA: Insufficient documentation

## 2017-01-07 DIAGNOSIS — S59911A Unspecified injury of right forearm, initial encounter: Secondary | ICD-10-CM | POA: Diagnosis not present

## 2017-01-07 DIAGNOSIS — Y929 Unspecified place or not applicable: Secondary | ICD-10-CM | POA: Insufficient documentation

## 2017-01-07 DIAGNOSIS — S6991XA Unspecified injury of right wrist, hand and finger(s), initial encounter: Secondary | ICD-10-CM | POA: Diagnosis not present

## 2017-01-07 DIAGNOSIS — M79631 Pain in right forearm: Secondary | ICD-10-CM | POA: Diagnosis not present

## 2017-01-07 NOTE — ED Provider Notes (Signed)
[facility] DEPT Provider Note   CSN: [id number] Arrival date & time: 01/07/17  1113     History   Chief Complaint Chief Complaint  Patient presents with  . Arm Injury    HPI [name] is a 81 y.o. female with no comorbidities, on coumadin, presenting with acute onset of persistent right arm pain beginning prior to arrival. Patient states she was file when it fell forward onto her, she caught it with her right arm and on her right knee. She localizes pain to right forearm and right second and third knuckles, that is worse with movement. She states she has not taken anything for pain prior to arrival. Denies numbness or tingling, denies head trauma or fall. No other complaints today.  The history is provided by the patient.    Past Medical History:  Diagnosis Date  . Adenomatous colon polyp 2008  . Asthma   . Complication of anesthesia   . CVA (cerebral vascular accident) ([place])    sl weaker on rt foot  . Diverticulosis   . GERD (gastroesophageal reflux disease)   . Hemorrhoids 2007  . HTN (hypertension)   . Hyperplastic colon polyp 2005  . Hypothyroidism   . Narcolepsy   . PONV (postoperative nausea and vomiting)   . TIA (transient ischemic attack)     Patient Active Problem List   Diagnosis Date Noted  . Breast cancer ([place]) 06/23/2016  . Change in bowel habits 01/11/2015  . Loss of weight 01/11/2015  . Abdominal pain, chronic, right upper quadrant 01/11/2015  . Unspecified chronic bronchitis ([place]) 09/25/2013  . Other dysphagia 04/01/2013  . Cough 01/20/2013  . Thrush 10/28/2012  . Chest pain 09/26/2012  . H/O adenomatous polyp of colon 11/18/2011  . FH: colon cancer 11/18/2011  . Chronic anticoagulation 11/18/2011    Past Surgical History:  Procedure Laterality Date  . ABDOMINAL HYSTERECTOMY     age 47, complicated by poor wound healing, followed by revision of scar and radiation for ?malignancy  . BREAST ENHANCEMENT SURGERY    . BREAST  IMPLANT EXCHANGE Right 06/23/2016   Procedure: RIGHT BREAST IMPLANT REMOVAL AND REPLACEMENT;  Surgeon: Cristine Polio, MD;  Location: [facility];  Service: Plastics;  Laterality: Right;  . COLONOSCOPY  2005   2 hyperplastic polyps  . COLONOSCOPY  2007   Dr. Oneida Alar- hemorrhoids  . COLONOSCOPY  2008   Dr. Reed Pandy polyp- rare sigmoid diverticulosis, internal hemorrhoids  . COLONOSCOPY  12/01/2011   Procedure: COLONOSCOPY;  Surgeon: Danie Binder, MD;  [facility];  Service: Endoscopy;  Laterality: N/A;  12:30 PM  . ESOPHAGOGASTRODUODENOSCOPY (EGD) WITH ESOPHAGEAL DILATION N/A 04/15/2013   Procedure: ESOPHAGOGASTRODUODENOSCOPY (EGD) WITH ESOPHAGEAL DILATION;  Surgeon: Danie Binder, MD;  [facility];  Service: Endoscopy;  Laterality: N/A;  11:45-moved to 12:30 Darius Bump to notify pt  . EYE SURGERY  2010  . EYE SURGERY    . right thumb surgery      OB History    No data available       Home Medications    Prior to Admission medications   Medication Sig Start Date End Date Taking? Authorizing Provider  albuterol (PROVENTIL) (2.5 MG/3ML) 0.083% nebulizer solution Take 3 mLs (2.5 mg total) by nebulization every 6 (six) hours as needed for wheezing or shortness of breath. 06/27/13   Baird Lyons D, MD  amLODipine (NORVASC) 5 MG tablet TAKE 1 TABLET BY MOUTH EVERY DAY 11/21/16   Bronson Ing,  Lenise Herald, MD  Ascorbic Acid (VITAMIN C) 1000 MG tablet Take 1,000 mg by mouth daily.    [provider]  B Complex Vitamins (VITAMIN B COMPLEX) TABS Take 1 tablet by mouth daily.    [provider]  Biotin 300 MCG TABS Take 1 tablet by mouth daily.    [provider]  CALCIUM CARBONATE PO Take 1 tablet by mouth daily. Patient states medication is calcium 1200 mg without vitamin D.    [provider]  cholecalciferol (VITAMIN D) 1000 UNITS tablet Take 1,000 Units by mouth daily.    [provider]  CYANOCOBALAMIN  PO Take 1 tablet by mouth daily. Patient unsure of strength    [provider]  ezetimibe (ZETIA) 10 MG tablet Take 10 mg by mouth daily.    [provider]  Ferrous Gluconate (IRON) 240 (27 FE) MG TABS Take 1 tablet by mouth daily.    [provider]  Flaxseed, Linseed, (FLAXSEED OIL) 1000 MG CAPS Take 1 capsule by mouth daily.     [provider]  furosemide (LASIX) 40 MG tablet Take 1 tab (40mg ) by mouth every morning & 1/2 tab (20mg ) every evening 11/18/13   Herminio Commons, MD  levothyroxine (SYNTHROID, LEVOTHROID) 50 MCG tablet Take 1 tablet by mouth daily. 10/05/13   [provider]  LUTEIN PO Take 1 capsule by mouth daily. Strength varies based on what is purchased at the time.    [provider]  Magnesium 250 MG TABS Take 1 tablet by mouth daily.    [provider]  metoprolol tartrate (LOPRESSOR) 25 MG tablet Take 25 mg (1 Tablet) in the AM and 12.5 (1/2 Tablet)  in the PM 01/04/16   Herminio Commons, MD  metoprolol tartrate (LOPRESSOR) 25 MG tablet TAKE 1 TABLET BY MOUTH TWICE DAILY 07/03/16   Herminio Commons, MD  Nebulizers (COMPRESSOR/NEBULIZER) MISC Use as directed 06/27/13   Deneise Lever, MD  Omega-3 Fatty Acids (FISH OIL) 1200 MG CAPS Take 1,200 mg by mouth daily.    [provider]  potassium chloride (K-DUR) 10 MEQ tablet Take 10 mEq by mouth 2 (two) times daily.    [provider]  Zinc 50 MG TABS Take 1 tablet by mouth daily.    [provider]    Family History Family History  Problem Relation Age of Onset  . Colon cancer Father        age 93  . Prostate cancer Father   . Pancreatic cancer Mother        age 43    Social History Social History  Substance Use Topics  . Smoking status: Never Smoker  . Smokeless tobacco: Never Used  . Alcohol use No     Allergies   Epinephrine; Demerol [meperidine]; and Vicodin [hydrocodone-acetaminophen]   Review of  Systems Review of Systems  Musculoskeletal: Positive for arthralgias and myalgias.  Skin: Positive for color change. Negative for wound.  Neurological: Negative for numbness and headaches.     Physical Exam Updated Vital Signs BP (!) 173/60 (BP Location: Left Arm)   Pulse (!) 58   Temp 97.9 F (36.6 C) (Oral)   Resp 18   Ht 5\' 3"  (1.6 m)   Wt 59.9 kg (132 lb)   SpO2 100%   BMI 23.38 kg/m   Physical Exam  Constitutional: She appears well-developed and well-nourished. No distress.  HENT:  Head: Normocephalic and atraumatic.  Eyes: Conjunctivae are normal.  Cardiovascular: Intact distal pulses.   Slightly bradycardic  Pulmonary/Chest: Effort normal.  Musculoskeletal:  Right second and third knuckles with mild erythema and tenderness, no gross deformities. Right proximal forearm with small bruise and tenderness. No wrist tenderness or elbow tenderness. Normal range of motion of entire right upper extremity. No edema or gross deformities.  Neurological:  Normal sensation right distal arm.  Psychiatric: She has a normal mood and affect. Her behavior is normal.  Nursing note and vitals reviewed.    ED Treatments / Results  Labs (all labs ordered are listed, but only abnormal results are displayed) Labs Reviewed - No data to display  EKG  EKG Interpretation None       Radiology Dg Forearm Right  Result Date: 01/07/2017 CLINICAL DATA:  Injury to the right forearm today with pain. EXAM: RIGHT FOREARM - 2 VIEW COMPARISON:  None. FINDINGS: There is no evidence of fracture or other focal bone lesions. Soft tissues are unremarkable. IMPRESSION: Negative. Electronically Signed   By: Ilona Sorrel M.D.   On: 01/07/2017 13:02   Dg Hand Complete Right  Result Date: 01/07/2017 CLINICAL DATA:  Injury.  Cabinet fell onto right arm. EXAM: RIGHT HAND - COMPLETE 3+ VIEW COMPARISON:  12/15/2012 FINDINGS: There is no evidence of fracture or dislocation. There is no evidence of  arthropathy or other focal bone abnormality. Soft tissues are unremarkable. IMPRESSION: Negative. Electronically Signed   By: Kerby Moors M.D.   On: 01/07/2017 13:05    Procedures Procedures (including critical care time)  Medications Ordered in ED Medications - No data to display   Initial Impression / Assessment and Plan / ED Course  I have reviewed the triage vital signs and the nursing notes.  Pertinent labs & imaging results that were available during my care of the patient were reviewed by me and considered in my medical decision making (see chart for details).     Patient with right arm contusion. Patient on Coumadin. Small bruises noted, without hematoma. X-ray of right forearm and hand negative for acute fracture or dislocation. Normal range of motion, NV intact. Discussed symptomatic management with patient and to monitor for signs of hematoma. Patient to return if she develops hematoma. Patient is safe for discharge home with PCP follow-up.  Patient discussed with Dr. Alvino Chapel.  Discussed results, findings, treatment and follow up. Patient advised of return precautions. Patient verbalized understanding and agreed with plan.  Final Clinical Impressions(s) / ED Diagnoses   Final diagnoses:  Arm contusion, right, initial encounter    New Prescriptions Discharge Medication List as of 01/07/2017  1:37 PM       Russo, [place] N, PA-C 01/07/17 1707    Davonna Belling, MD 01/08/17 1511

## 2017-01-07 NOTE — ED Triage Notes (Signed)
File cabinet fell on right arm today.  C/o pain to forearm and knuckles on left hand.

## 2017-01-07 NOTE — Discharge Instructions (Signed)
Please read instructions below. Apply ice to your arm for 20 minutes at a time. Follow up with your primary care provider is symptoms persist/ Return to the ER for new or worsening symptoms.

## 2017-01-07 NOTE — ED Notes (Signed)
Bruise noted to right hand, right knee and right wrist.  Rates pates pain 2/10.

## 2017-01-13 DIAGNOSIS — I10 Essential (primary) hypertension: Secondary | ICD-10-CM | POA: Diagnosis not present

## 2017-01-13 DIAGNOSIS — E119 Type 2 diabetes mellitus without complications: Secondary | ICD-10-CM | POA: Diagnosis not present

## 2017-01-13 DIAGNOSIS — E039 Hypothyroidism, unspecified: Secondary | ICD-10-CM | POA: Diagnosis not present

## 2017-01-16 DIAGNOSIS — R600 Localized edema: Secondary | ICD-10-CM | POA: Diagnosis not present

## 2017-01-16 DIAGNOSIS — M542 Cervicalgia: Secondary | ICD-10-CM | POA: Diagnosis not present

## 2017-01-16 DIAGNOSIS — M8580 Other specified disorders of bone density and structure, unspecified site: Secondary | ICD-10-CM | POA: Diagnosis not present

## 2017-01-16 DIAGNOSIS — M79604 Pain in right leg: Secondary | ICD-10-CM | POA: Diagnosis not present

## 2017-01-16 DIAGNOSIS — R944 Abnormal results of kidney function studies: Secondary | ICD-10-CM | POA: Diagnosis not present

## 2017-01-16 DIAGNOSIS — R140 Abdominal distension (gaseous): Secondary | ICD-10-CM | POA: Diagnosis not present

## 2017-01-16 DIAGNOSIS — E039 Hypothyroidism, unspecified: Secondary | ICD-10-CM | POA: Diagnosis not present

## 2017-01-16 DIAGNOSIS — Z6823 Body mass index (BMI) 23.0-23.9, adult: Secondary | ICD-10-CM | POA: Diagnosis not present

## 2017-01-16 DIAGNOSIS — E782 Mixed hyperlipidemia: Secondary | ICD-10-CM | POA: Diagnosis not present

## 2017-01-16 DIAGNOSIS — Z8673 Personal history of transient ischemic attack (TIA), and cerebral infarction without residual deficits: Secondary | ICD-10-CM | POA: Diagnosis not present

## 2017-01-20 ENCOUNTER — Ambulatory Visit (INDEPENDENT_AMBULATORY_CARE_PROVIDER_SITE_OTHER): Payer: PPO | Admitting: Cardiovascular Disease

## 2017-01-20 ENCOUNTER — Encounter: Payer: Self-pay | Admitting: Cardiovascular Disease

## 2017-01-20 VITALS — BP 138/80 | HR 63 | Ht 60.0 in | Wt 136.0 lb

## 2017-01-20 DIAGNOSIS — I10 Essential (primary) hypertension: Secondary | ICD-10-CM

## 2017-01-20 DIAGNOSIS — R600 Localized edema: Secondary | ICD-10-CM

## 2017-01-20 DIAGNOSIS — R072 Precordial pain: Secondary | ICD-10-CM

## 2017-01-20 DIAGNOSIS — R002 Palpitations: Secondary | ICD-10-CM

## 2017-01-20 MED ORDER — FUROSEMIDE 40 MG PO TABS: 40.0000 mg | ORAL_TABLET | Freq: Two times a day (BID) | ORAL | 3 refills | Status: DC

## 2017-01-20 MED ORDER — FUROSEMIDE 40 MG PO TABS: ORAL_TABLET | ORAL | 3 refills | Status: DC

## 2017-01-20 NOTE — Addendum Note (Signed)
Addended by: Laurine Blazer on: 01/20/2017 02:00 PM   Modules accepted: Orders

## 2017-01-20 NOTE — Addendum Note (Signed)
Addended by: Laurine Blazer on: 01/20/2017 02:04 PM   Modules accepted: Orders

## 2017-01-20 NOTE — Progress Notes (Addendum)
SUBJECTIVE:  The patient returns for follow-up of chest pain, palpitations, leg swelling, and hypertension. 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  She has been having bilateral leg edema, right worse than left. Ever since her stroke she has had chronic right lower extremity edema. She uses a stationary bike. She seldom has chest pains.    Review of Systems: As per "subjective", otherwise negative.  Allergies  Allergen Reactions  . Epinephrine Anaphylaxis  . Demerol [Meperidine] Other (See Comments)    headaches  . Vicodin [Hydrocodone-Acetaminophen] Other (See Comments)    [name] it makes her crazy    Current Outpatient Prescriptions  Medication Sig Dispense Refill  . albuterol (PROVENTIL) (2.5 MG/3ML) 0.083% nebulizer solution Take 3 mLs (2.5 mg total) by nebulization every 6 (six) hours as needed for wheezing or shortness of breath. 75 mL 12  . amLODipine (NORVASC) 5 MG tablet TAKE 1 TABLET BY MOUTH EVERY DAY 30 tablet 6  . Ascorbic Acid (VITAMIN C) 1000 MG tablet Take 1,000 mg by mouth daily.    . B Complex Vitamins (VITAMIN B COMPLEX) TABS Take 1 tablet by mouth daily.    . Biotin 300 MCG TABS Take 1 tablet by mouth daily.    Marland Kitchen CALCIUM CARBONATE PO Take 1 tablet by mouth daily. Patient states medication is calcium 1200 mg without vitamin D.    . cholecalciferol (VITAMIN D) 1000 UNITS tablet Take 1,000 Units by mouth daily.    . CYANOCOBALAMIN PO Take 1 tablet by mouth daily. Patient unsure of strength    . ezetimibe (ZETIA) 10 MG tablet Take 10 mg by mouth daily.    . Ferrous Gluconate (IRON) 240 (27 FE) MG TABS Take 1 tablet by mouth daily.    . Flaxseed, Linseed, (FLAXSEED OIL) 1000 MG CAPS Take 1 capsule by mouth daily.     . furosemide (LASIX) 40 MG tablet Take 1 tab (40mg ) by mouth every morning & 1/2 tab (20mg ) every evening    . levothyroxine (SYNTHROID, LEVOTHROID) 50 MCG tablet Take 75 mcg by mouth daily.     . LUTEIN PO Take 1 capsule by mouth  daily. Strength varies based on what is purchased at the time.    . Magnesium 250 MG TABS Take 1 tablet by mouth daily.    . metoprolol tartrate (LOPRESSOR) 25 MG tablet TAKE 1 TABLET BY MOUTH TWICE DAILY 60 tablet 3  . Nebulizers (COMPRESSOR/NEBULIZER) MISC Use as directed 1 each 0  . Omega-3 Fatty Acids (FISH OIL) 1200 MG CAPS Take 1,200 mg by mouth daily.    . potassium chloride (K-DUR) 10 MEQ tablet Take 10 mEq by mouth 2 (two) times daily.    Marland Kitchen warfarin (COUMADIN) 4 MG tablet Take 4 mg by mouth daily.    . Zinc 50 MG TABS Take 1 tablet by mouth daily.     No current facility-administered medications for this visit.     Past Medical History:  Diagnosis Date  . Adenomatous colon polyp 2008  . Asthma   . Complication of anesthesia   . CVA (cerebral vascular accident) ([place])    sl weaker on rt foot  . Diverticulosis   . GERD (gastroesophageal reflux disease)   . Hemorrhoids 2007  . HTN (hypertension)   . Hyperplastic colon polyp 2005  . Hypothyroidism   . Narcolepsy   . PONV (postoperative nausea and vomiting)   . TIA (transient ischemic attack)     Past Surgical History:  Procedure Laterality Date  . ABDOMINAL HYSTERECTOMY     age 59, complicated by poor wound healing, followed by revision of scar and radiation for ?malignancy  . BREAST ENHANCEMENT SURGERY    . BREAST IMPLANT EXCHANGE Right 06/23/2016   Procedure: RIGHT BREAST IMPLANT REMOVAL AND REPLACEMENT;  Surgeon: Cristine Polio, MD;  Location: [facility];  Service: Plastics;  Laterality: Right;  . COLONOSCOPY  2005   2 hyperplastic polyps  . COLONOSCOPY  2007   Dr. Oneida Alar- hemorrhoids  . COLONOSCOPY  2008   Dr. Reed Pandy polyp- rare sigmoid diverticulosis, internal hemorrhoids  . COLONOSCOPY  12/01/2011   Procedure: COLONOSCOPY;  Surgeon: Danie Binder, MD;  [facility];  Service: Endoscopy;  Laterality: N/A;  12:30 PM  . ESOPHAGOGASTRODUODENOSCOPY (EGD) WITH ESOPHAGEAL DILATION  N/A 04/15/2013   Procedure: ESOPHAGOGASTRODUODENOSCOPY (EGD) WITH ESOPHAGEAL DILATION;  Surgeon: Danie Binder, MD;  [facility];  Service: Endoscopy;  Laterality: N/A;  11:45-moved to 12:30 Darius Bump to notify pt  . EYE SURGERY  2010  . EYE SURGERY    . right thumb surgery      Social History   Social History  . Marital status: Widowed    Spouse name: N/A  . Number of children: N/A  . Years of education: N/A   Occupational History  . Not on file.   Social History Main Topics  . Smoking status: Never Smoker  . Smokeless tobacco: Never Used  . Alcohol use No  . Drug use: No  . Sexual activity: No   Other Topics Concern  . Not on file   Social History Narrative  . No narrative on file     Vitals:   01/20/17 1324  BP: 138/80  Pulse: 63  SpO2: 97%  Weight: 136 lb (61.7 kg)  Height: 5' (1.524 m)    Wt Readings from Last 3 Encounters:  01/20/17 136 lb (61.7 kg)  01/07/17 132 lb (59.9 kg)  06/23/16 135 lb (61.2 kg)     PHYSICAL EXAM General: NAD HEENT: Normal. Neck: No JVD, no thyromegaly. Lungs: Clear to auscultation bilaterally with normal respiratory effort. CV: Nondisplaced PMI.  Regular rate and rhythm, normal S1/S2, no S3/S4, no murmur. 1+ pitting edema in left leg, trace to 1+ in right leg. Abdomen: Soft, nontender, no distention.  Neurologic: Alert and oriented.  Psych: Normal affect. Skin: Normal. Musculoskeletal: No gross deformities.    ECG: Most recent ECG reviewed.   Labs: Lab Results  Component Value Date/Time   K 4.0 06/18/2016 02:30 PM   BUN 36 (H) 06/18/2016 02:30 PM   CREATININE 1.07 (H) 06/18/2016 02:30 PM   CREATININE 1.00 (H) 01/11/2015 09:54 AM   ALT 16 07/02/2008 01:30 PM   HGB 15.0 01/03/2009 04:16 PM     Lipids: Lab Results  Component Value Date/Time   [facility]  01/04/2009 03:27 AM    65        Total Cholesterol/HDL:CHD Risk Coronary Heart Disease Risk Table                     Men   Women  1/2 Average  Risk   3.4   3.3  Average Risk       5.0   4.4  2 X Average Risk   9.6   7.1  3 X Average Risk  23.4   11.0        Use the calculated Patient Ratio above and the CHD Risk  Table to determine the patient's CHD Risk.        ATP III CLASSIFICATION (LDL):  <100     mg/dL   Optimal  100-129  mg/dL   Near or Above                    Optimal  130-159  mg/dL   Borderline  160-189  mg/dL   High  >190     mg/dL   Very High   CHOL  01/04/2009 03:27 AM    136        ATP III CLASSIFICATION:  <200     mg/dL   Desirable  200-239  mg/dL   Borderline High  >=240    mg/dL   High          TRIG 78 01/04/2009 03:27 AM   HDL 55 01/04/2009 03:27 AM       ASSESSMENT AND PLAN: 1. Chest pain: Overall symptomatically stable. She underwent coronary angiography in 2008 which revealed nonobstructive disease. Lexiscan Cardiolite stress testing was normal in 08/2013. Echocardiogram demonstrated normal left ventricular systolic function and grade 2 diastolic function.   2. Essential hypertension: Well controlled on amlodipine 5 mg daily. No changes.  3. Tachycardia/palpitations: Symptomatically stable on metoprolol. No changes. Prior event monitoring did not demonstrate any supraventricular tachyarrhythmias. Symptoms correlated with sinus rhythm.   4. Bilateral leg edema: Increase Lasix to 40 mg bid. She will use compression stockings when the weather is cooler.      Disposition: Follow up 1 yr   Kate Sable, M.D., F.A.C.C.

## 2017-01-20 NOTE — Patient Instructions (Addendum)
Medication Instructions:   Increase Lasix to 40mg  twice a day.  Continue all other medications.    Labwork: none  Testing/Procedures: none  Follow-Up: Your physician wants you to follow up in:  1 year.  You will receive a reminder letter in the mail one-two months in advance.  If you don't receive a letter, please call our office to schedule the follow up appointment   Any Other Special Instructions Will Be Listed Below (If Applicable).  If you need a refill on your cardiac medications before your next appointment, please call your pharmacy.

## 2017-01-21 ENCOUNTER — Ambulatory Visit (INDEPENDENT_AMBULATORY_CARE_PROVIDER_SITE_OTHER): Payer: PPO | Admitting: Gastroenterology

## 2017-01-21 ENCOUNTER — Encounter: Payer: Self-pay | Admitting: Gastroenterology

## 2017-01-21 DIAGNOSIS — Z86010 Personal history of colonic polyps: Secondary | ICD-10-CM | POA: Diagnosis not present

## 2017-01-21 DIAGNOSIS — R194 Change in bowel habit: Secondary | ICD-10-CM | POA: Diagnosis not present

## 2017-01-21 DIAGNOSIS — R1319 Other dysphagia: Secondary | ICD-10-CM | POA: Diagnosis not present

## 2017-01-21 NOTE — Assessment & Plan Note (Signed)
LAST TCS 2013: 3 SIMPLE ADENOMAS REMOVED(< 1 CM). NO WARNING SIGNS/SYMPTOMS  CONTINUE TO MONITOR SYMPTOMS.

## 2017-01-21 NOTE — Progress Notes (Signed)
cc'ed to pcp °

## 2017-01-21 NOTE — Patient Instructions (Signed)
DRINK WATER TO KEEP YOUR URINE LIGHT YELLOW.  CONTINUE YOUR HIGH FIBER DIET. AVOID ITEMS THAT CAUSE BLOATING & GAS. SEE INFO BELOW.  ADD LINZESS 30 MINS PRIOR TO BREAKFAST WHEN NEEDED TO AVOID CONSTIPATION. OPEN LINZESS CAPSULE. PLACE GRANULES IN 2 TEASPOONS OF WATER. STIR IT FOR 30 SECONDS. TAKE 1 TSP OF THE WATER  ONCE TO THREE TIMES A WEEK. YOU DO NOT NEED TO TO TAKE THE GRANULES THE MEDICINE IS IN THE WATER. IT MAY CAUSE EXPLOSIVE DIARRHEA.  PLEASE CALL IF YOUR WOULD LIKE A PRESCRIPTION FOR LINZESS.  FOLLOW UP IN 4 MOS.    BLOATING AND GAS PREVENTION  Although gas may be uncomfortable and embarrassing, it is not life-threatening. Understanding causes, ways to reduce symptoms, and treatment will help most people find some relief.  Points to remember . Everyone has gas in the digestive tract. Marland Kitchen People often believe normal passage of gas to be excessive. . Gas comes from two main sources: swallowed air and normal breakdown of certain foods by harmless bacteria naturally present in the large intestine. . Many foods with carbohydrates can cause gas. Fats and proteins cause little gas. . Foods that may cause gas include o beans  o vegetables, such as broccoli, cabbage, brussels sprouts, onions, artichokes, and asparagus  o fruits, such as pears, apples, and peaches  o whole grains, such as whole wheat and bran  o soft drinks and fruit drinks  o milk and milk products, such as cheese and ice cream, and packaged foods prepared with lactose, such as bread, cereal, and salad dressing  o foods containing sorbitol, such as dietetic foods and sugar free candies and gums . The most common symptoms of gas are belching, flatulence, bloating, and abdominal pain. However, some of these symptoms are often caused by an intestinal disorder, such as irritable bowel syndrome, rather than too much gas. . The most common ways to reduce the discomfort of gas are changing diet, taking nonprescription  medicines, and reducing the amount of air swallowed. . Digestive enzymes, such as lactase supplements, actually help digest carbohydrates and may allow people to eat foods that normally cause gas.

## 2017-01-21 NOTE — Assessment & Plan Note (Addendum)
SYMPTOMS NOT IDEALLY CONTROLLED ON OTC STOOL SOFTENERS. LAST SEEN IN 2016 WITH SIMILAR COMPLAINT.  REVIEWED TCS FROM 2007, 2008, AND 2013. LAST TCS HAD 39 MIN CECAL WITHDRAWAL TIME. DRINK WATER TO KEEP YOUR URINE LIGHT YELLOW. CONTINUE YOUR HIGH FIBER DIET. AVOID ITEMS THAT CAUSE BLOATING & GAS. SEE INFO BELOW.  ADD LINZESS 30 MINS PRIOR TO BREAKFAST WHEN NEEDED TO AVOID CONSTIPATION. OPEN LINZESS CAPSULE. PLACE GRANULES IN 2 TEASPOONS OF WATER. STIR IT FOR 30 SECONDS. TAKE 1 TSP OF THE WATER  ONCE TO THREE TIMES A WEEK. YOU DO NOT NEED TO TO TAKE THE GRANULES THE MEDICINE IS IN THE WATER. IT MAY CAUSE EXPLOSIVE DIARRHEA.  PLEASE CALL IF YOUR WOULD LIKE A PRESCRIPTION FOR LINZESS. PT [facility]S WILL PLACE HER IN THE DONUT HOLE.  FOLLOW UP IN 4 MOS.

## 2017-01-21 NOTE — Progress Notes (Addendum)
Primary Care Physician:  Celene Squibb, MD Primary Gastroenterologist:  Dr. Oneida Alar  Pre-Procedure History & Physical: HPI:  [name] is a 81 y.o. female here for constipation. GOES SEVERAL TIMES A DAY. 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 BOWELS HAVE BEEN A PROBLEMS SINCE 6-8 MOS.TWO YEARS AGO BOWELS WERE MORE REGULAR.AS A TEENAGER WAS CONSTIPATED A LOT. ADDED BUPROPRION A WHILE AGO. TAKES CALCIUM WITH MEALS. TAKING IRON FOR A LONG TIME. STOOL MAY BE REALLY DARK. STAYS BLOATED: ALL THE TIME. RARE TROUBLE GETTING CHOKED. MEDS TRIED TO REGULATE BOWELS: OTC STOOL SOFTENER. CAN'T DO MOM: VOMITS. HASN'T TRIED SENNA. NOW HAVING 4-6 BMs A DAY: #1 OR #6.  USUALLY #1 AND THEN #5 AND THEN #6.  PT DENIES FEVER, CHILLS, HEMATOCHEZIA, HEMATEMESIS, nausea, vomiting, melena, diarrhea, CHEST PAIN, SHORTNESS OF BREATH, CHANGE IN BOWEL IN HABITS, abdominal pain, problems swallowing, OR heartburn or indigestion.  Past Medical History:  Diagnosis Date  . Adenomatous colon polyp 2008  . Asthma   . Complication of anesthesia   . CVA (cerebral vascular accident) ([place])    sl weaker on rt foot  . Diverticulosis   . GERD (gastroesophageal reflux disease)   . Hemorrhoids 2007  . HTN (hypertension)   . Hyperplastic colon polyp 2005  . Hypothyroidism   . Narcolepsy   . PONV (postoperative nausea and vomiting)   . TIA (transient ischemic attack)    Past Surgical History:  Procedure Laterality Date  . ABDOMINAL HYSTERECTOMY     age 64, complicated by poor wound healing, followed by revision of scar and radiation for ?malignancy  . BREAST ENHANCEMENT SURGERY    . BREAST IMPLANT EXCHANGE Right 06/23/2016   Procedure: RIGHT BREAST IMPLANT REMOVAL AND REPLACEMENT;  Surgeon: Cristine Polio, MD;  Location: [facility];  Service: Plastics;  Laterality: Right;  . COLONOSCOPY  2005   2 hyperplastic polyps  . COLONOSCOPY  2007   Dr. Oneida Alar- hemorrhoids  .  COLONOSCOPY  2008   Dr. Reed Pandy polyp- rare sigmoid diverticulosis, internal hemorrhoids  . COLONOSCOPY  12/01/2011   Procedure: COLONOSCOPY;  Surgeon: Danie Binder, MD;  [facility];  Service: Endoscopy;  Laterality: N/A;  12:30 PM  . ESOPHAGOGASTRODUODENOSCOPY (EGD) WITH ESOPHAGEAL DILATION N/A 04/15/2013   Procedure: ESOPHAGOGASTRODUODENOSCOPY (EGD) WITH ESOPHAGEAL DILATION;  Surgeon: Danie Binder, MD;  [facility];  Service: Endoscopy;  Laterality: N/A;  11:45-moved to 12:30 Darius Bump to notify pt  . EYE SURGERY  2010  . EYE SURGERY    . right thumb surgery      Prior to Admission medications   Medication Sig Start Date End Date Taking? Authorizing Provider  albuterol (PROVENTIL) (2.5 MG/3ML) 0.083% nebulizer solution Take 3 mLs (2.5 mg total) by nebulization every 6 (six) hours as needed for wheezing or shortness of breath. 06/27/13   Baird Lyons D, MD  amLODipine (NORVASC) 5 MG tablet TAKE 1 TABLET BY MOUTH EVERY DAY 11/21/16   Herminio Commons, MD  Ascorbic Acid (VITAMIN C) 1000 MG tablet Take 1,000 mg by mouth daily.    [provider]  B Complex Vitamins (VITAMIN B COMPLEX) TABS Take 1 tablet by mouth daily.    [provider]  Biotin 300 MCG TABS Take 1 tablet by mouth daily.    [provider]  CALCIUM CARBONATE PO Take 1 tablet by mouth daily. Patient states medication is calcium 1200 mg without vitamin D.    [provider]  cholecalciferol (VITAMIN D) 1000 UNITS tablet Take 1,000 Units by mouth daily.    [provider]  CYANOCOBALAMIN PO Take 1 tablet by mouth daily. Patient unsure of strength    [provider]  ezetimibe (ZETIA) 10 MG tablet Take 10 mg by mouth daily.    [provider]  Ferrous Gluconate (IRON) 240 (27 FE) MG TABS Take 1 tablet by mouth daily.    [provider]  Flaxseed, Linseed, (FLAXSEED OIL) 1000 MG CAPS Take 1 capsule by mouth daily.      [provider]  furosemide (LASIX) 40 MG tablet Take 1 tablet (40 mg total) by mouth 2 (two) times daily. 01/20/17   Herminio Commons, MD  levothyroxine (SYNTHROID, LEVOTHROID) 50 MCG tablet Take 75 mcg by mouth daily.  10/05/13   [provider]  LUTEIN PO Take 1 capsule by mouth daily. Strength varies based on what is purchased at the time.    [provider]  Magnesium 250 MG TABS Take 1 tablet by mouth daily.    [provider]  metoprolol tartrate (LOPRESSOR) 25 MG tablet TAKE 1 TABLET BY MOUTH TWICE DAILY 07/03/16   Herminio Commons, MD  Nebulizers (COMPRESSOR/NEBULIZER) MISC Use as directed 06/27/13   Deneise Lever, MD  Omega-3 Fatty Acids (FISH OIL) 1200 MG CAPS Take 1,200 mg by mouth daily.    [provider]  potassium chloride (K-DUR) 10 MEQ tablet Take 10 mEq by mouth 2 (two) times daily.    [provider]  warfarin (COUMADIN) 4 MG tablet Take 4 mg by mouth daily.    [provider]  Zinc 50 MG TABS Take 1 tablet by mouth daily.    [provider]   Allergies as of 01/21/2017 - Review Complete 01/21/2017  Allergen Reaction Noted  . Epinephrine Anaphylaxis 11/18/2011  . Demerol [meperidine] Other (See Comments) 11/18/2011  . Vicodin [hydrocodone-acetaminophen] Other (See Comments) 11/18/2011   Family History  Problem Relation Age of Onset  . Colon cancer Father        age 29  . Prostate cancer Father   . Pancreatic cancer Mother        age 76   Social History   Social History  . Marital status: Widowed    Spouse name: N/A  . Number of children: N/A  . Years of education: N/A   Occupational History  . Not on file.   Social History Main Topics  . Smoking status: Never Smoker  . Smokeless tobacco: Never Used  . Alcohol use No  . Drug use: No  . Sexual activity: No   Other Topics Concern  . Not on file   Social History Narrative  . No narrative on file   Review of Systems: See HPI,  otherwise negative ROS. HAS TROUBLE WITH NECK PAIN AND PAIN UNDER RIGHT JAW. NEVER SEEN MASSAGE THERAPIST. 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   Physical Exam: BP 129/75   Pulse 62   Temp 98.7 F (37.1 C) (Oral)   Ht 5\' 1"  (1.549 m)   Wt 133 lb 6.4 oz (60.5 kg)   BMI 25.21 kg/m  General:   Alert,  pleasant and cooperative in NAD Head:  Normocephalic and atraumatic. Neck:  Supple; Lungs:  Clear throughout to auscultation.    Heart:  Regular rate and rhythm. Abdomen:  Soft, nontender and nondistended. Normal bowel sounds, without guarding, and without rebound.  Neurologic:  Alert and  oriented x4; NO  NEW FOCAL DEFICITS EXTREMITIES: 1-2+ BILATERAL LOWER LEG/PEDAL EDEMA SKIN: NO RASH  Impression/Plan:

## 2017-01-21 NOTE — Progress Notes (Signed)
ON RECALL  °

## 2017-01-21 NOTE — Assessment & Plan Note (Signed)
SYMPTOMS FAIRLY WELL CONTROLLED.  CONTINUE TO MONITOR SYMPTOMS. 

## 2017-02-01 DIAGNOSIS — R600 Localized edema: Secondary | ICD-10-CM | POA: Diagnosis not present

## 2017-02-01 DIAGNOSIS — Z6824 Body mass index (BMI) 24.0-24.9, adult: Secondary | ICD-10-CM | POA: Diagnosis not present

## 2017-02-06 DIAGNOSIS — I10 Essential (primary) hypertension: Secondary | ICD-10-CM | POA: Diagnosis not present

## 2017-02-06 DIAGNOSIS — Z6823 Body mass index (BMI) 23.0-23.9, adult: Secondary | ICD-10-CM | POA: Diagnosis not present

## 2017-02-06 DIAGNOSIS — R600 Localized edema: Secondary | ICD-10-CM | POA: Diagnosis not present

## 2017-02-13 DIAGNOSIS — R002 Palpitations: Secondary | ICD-10-CM | POA: Diagnosis not present

## 2017-02-13 DIAGNOSIS — R000 Tachycardia, unspecified: Secondary | ICD-10-CM | POA: Diagnosis not present

## 2017-03-17 DIAGNOSIS — G5139 Clonic hemifacial spasm, unspecified: Secondary | ICD-10-CM | POA: Diagnosis not present

## 2017-03-17 DIAGNOSIS — R002 Palpitations: Secondary | ICD-10-CM | POA: Diagnosis not present

## 2017-03-17 DIAGNOSIS — R600 Localized edema: Secondary | ICD-10-CM | POA: Diagnosis not present

## 2017-03-17 DIAGNOSIS — R000 Tachycardia, unspecified: Secondary | ICD-10-CM | POA: Diagnosis not present

## 2017-04-07 ENCOUNTER — Encounter: Payer: Self-pay | Admitting: Gastroenterology

## 2017-04-21 DIAGNOSIS — R51 Headache: Secondary | ICD-10-CM | POA: Diagnosis not present

## 2017-04-21 DIAGNOSIS — Z7901 Long term (current) use of anticoagulants: Secondary | ICD-10-CM | POA: Diagnosis not present

## 2017-05-12 DIAGNOSIS — Z86718 Personal history of other venous thrombosis and embolism: Secondary | ICD-10-CM | POA: Diagnosis not present

## 2017-05-12 DIAGNOSIS — K5900 Constipation, unspecified: Secondary | ICD-10-CM | POA: Diagnosis not present

## 2017-06-03 DIAGNOSIS — K5900 Constipation, unspecified: Secondary | ICD-10-CM | POA: Diagnosis not present

## 2017-06-03 DIAGNOSIS — R55 Syncope and collapse: Secondary | ICD-10-CM | POA: Diagnosis not present

## 2017-06-03 DIAGNOSIS — R079 Chest pain, unspecified: Secondary | ICD-10-CM | POA: Diagnosis not present

## 2017-06-03 DIAGNOSIS — Z7901 Long term (current) use of anticoagulants: Secondary | ICD-10-CM | POA: Diagnosis not present

## 2017-06-03 DIAGNOSIS — M79601 Pain in right arm: Secondary | ICD-10-CM | POA: Diagnosis not present

## 2017-06-03 DIAGNOSIS — M542 Cervicalgia: Secondary | ICD-10-CM | POA: Diagnosis not present

## 2017-06-03 DIAGNOSIS — M79602 Pain in left arm: Secondary | ICD-10-CM | POA: Diagnosis not present

## 2017-06-03 DIAGNOSIS — E039 Hypothyroidism, unspecified: Secondary | ICD-10-CM | POA: Diagnosis not present

## 2017-06-05 ENCOUNTER — Other Ambulatory Visit: Payer: Self-pay

## 2017-06-05 ENCOUNTER — Encounter: Payer: Self-pay | Admitting: *Deleted

## 2017-06-05 ENCOUNTER — Encounter: Payer: Self-pay | Admitting: Cardiovascular Disease

## 2017-06-05 ENCOUNTER — Telehealth: Payer: Self-pay | Admitting: Cardiovascular Disease

## 2017-06-05 ENCOUNTER — Ambulatory Visit (INDEPENDENT_AMBULATORY_CARE_PROVIDER_SITE_OTHER): Payer: PPO | Admitting: Cardiovascular Disease

## 2017-06-05 VITALS — BP 132/68 | HR 55 | Ht 60.0 in | Wt 136.4 lb

## 2017-06-05 DIAGNOSIS — R600 Localized edema: Secondary | ICD-10-CM

## 2017-06-05 DIAGNOSIS — R079 Chest pain, unspecified: Secondary | ICD-10-CM | POA: Diagnosis not present

## 2017-06-05 DIAGNOSIS — I10 Essential (primary) hypertension: Secondary | ICD-10-CM

## 2017-06-05 DIAGNOSIS — R002 Palpitations: Secondary | ICD-10-CM

## 2017-06-05 NOTE — Telephone Encounter (Signed)
Pre-cert Verification for the following procedure   Echo scheduled for 06/10/17 at [facility] scheduled 06-12-17 at [facility]

## 2017-06-05 NOTE — Progress Notes (Signed)
SUBJECTIVE: The patient presents for evaluation of chest pain.  She underwent a normal nuclear stress test on 09/01/2013. She has been having bilateral leg edema, right worse than left. Ever since her stroke she has had chronic right lower extremity edema.  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  She woke up shortly after 1 AM on Wednesday night with chest pressure radiating down both arms.  She said her heart rate and blood pressure were significantly elevated.  She initially took one nitroglycerin but did not have complete relief of her symptoms.  She took another nitroglycerin and she said her blood pressure became very low.  She said the entire episode lasted for over an hour.  She has had some right sided neck pain as well.  She said she felt unsteady after the symptoms occurred and she lie down on the floor.  She has not had any recurrence of symptoms.  She saw her PCP who reportedly performed an ECG and obtain blood test.  We are in the process of trying to acquire those records.    Review of Systems: As per "subjective", otherwise negative.  Allergies  Allergen Reactions  . Epinephrine Anaphylaxis  . Demerol [Meperidine] Other (See Comments)    headaches  . Vicodin [Hydrocodone-Acetaminophen] Other (See Comments)    [name] it makes her crazy    Current Outpatient Medications  Medication Sig Dispense Refill  . albuterol (PROVENTIL) (2.5 MG/3ML) 0.083% nebulizer solution Take 3 mLs (2.5 mg total) by nebulization every 6 (six) hours as needed for wheezing or shortness of breath. 75 mL 12  . amLODipine (NORVASC) 5 MG tablet TAKE 1 TABLET BY MOUTH EVERY DAY 30 tablet 6  . Ascorbic Acid (VITAMIN C) 1000 MG tablet Take 1,000 mg by mouth daily.    . B Complex Vitamins (VITAMIN B COMPLEX) TABS Take 1 tablet by mouth daily.    . Biotin 300 MCG TABS Take 1 tablet by mouth daily.    Marland Kitchen buPROPion (WELLBUTRIN XL) 150 MG 24 hr tablet Take 150 mg by mouth daily.    Marland Kitchen CALCIUM  CARBONATE PO Take 1 tablet by mouth daily. Patient states medication is calcium 1200 mg without vitamin D.    . cholecalciferol (VITAMIN D) 1000 UNITS tablet Take 1,000 Units by mouth daily.    . CYANOCOBALAMIN PO Take 1 tablet by mouth daily. Patient unsure of strength    . ezetimibe (ZETIA) 10 MG tablet Take 10 mg by mouth daily.    . Ferrous Gluconate (IRON) 240 (27 FE) MG TABS Take 1 tablet by mouth daily.    . Flaxseed, Linseed, (FLAXSEED OIL) 1000 MG CAPS Take 1 capsule by mouth.     . furosemide (LASIX) 40 MG tablet Take 1 tablet (40 mg total) by mouth 2 (two) times daily. 180 tablet 3  . Lecithin 1200 MG CAPS Take 1 capsule by mouth daily.    Marland Kitchen levothyroxine (SYNTHROID, LEVOTHROID) 75 MCG tablet Take 1 tablet by mouth daily.  4  . LUTEIN PO Take 1 capsule by mouth daily. Strength varies based on what is purchased at the time.    . Magnesium 250 MG TABS Take 1 tablet by mouth daily.    . metoprolol tartrate (LOPRESSOR) 25 MG tablet TAKE 1 TABLET BY MOUTH TWICE DAILY 60 tablet 3  . Nebulizers (COMPRESSOR/NEBULIZER) MISC Use as directed 1 each 0  . Omega-3 Fatty Acids (FISH OIL) 1200 MG CAPS Take 1,200 mg by mouth daily.    Marland Kitchen  pantoprazole (PROTONIX) 40 MG tablet Take 1 tablet by mouth daily as needed.    . potassium chloride (K-DUR) 10 MEQ tablet Take 10 mEq by mouth 2 (two) times daily.    . Turmeric 500 MG TABS Take by mouth daily.    Marland Kitchen warfarin (COUMADIN) 4 MG tablet Take 4 mg by mouth daily.    . Zinc 50 MG TABS Take 1 tablet by mouth daily.     No current facility-administered medications for this visit.     Past Medical History:  Diagnosis Date  . Adenomatous colon polyp 2008  . Asthma   . Complication of anesthesia   . CVA (cerebral vascular accident) ([place])    sl weaker on rt foot  . Diverticulosis   . GERD (gastroesophageal reflux disease)   . Hemorrhoids 2007  . HTN (hypertension)   . Hyperplastic colon polyp 2005  . Hypothyroidism   . Narcolepsy   . PONV  (postoperative nausea and vomiting)   . TIA (transient ischemic attack)     Past Surgical History:  Procedure Laterality Date  . ABDOMINAL HYSTERECTOMY     age 89, complicated by poor wound healing, followed by revision of scar and radiation for ?malignancy  . BREAST ENHANCEMENT SURGERY    . BREAST IMPLANT EXCHANGE Right 06/23/2016   Procedure: RIGHT BREAST IMPLANT REMOVAL AND REPLACEMENT;  Surgeon: Cristine Polio, MD;  Location: [facility];  Service: Plastics;  Laterality: Right;  . COLONOSCOPY  2005   2 hyperplastic polyps  . COLONOSCOPY  2007   Dr. Oneida Alar- hemorrhoids  . COLONOSCOPY  2008   Dr. Reed Pandy polyp- rare sigmoid diverticulosis, internal hemorrhoids  . COLONOSCOPY  12/01/2011   Procedure: COLONOSCOPY;  Surgeon: Danie Binder, MD;  [facility];  Service: Endoscopy;  Laterality: N/A;  12:30 PM  . ESOPHAGOGASTRODUODENOSCOPY (EGD) WITH ESOPHAGEAL DILATION N/A 04/15/2013   Procedure: ESOPHAGOGASTRODUODENOSCOPY (EGD) WITH ESOPHAGEAL DILATION;  Surgeon: Danie Binder, MD;  [facility];  Service: Endoscopy;  Laterality: N/A;  11:45-moved to 12:30 Darius Bump to notify pt  . EYE SURGERY  2010  . EYE SURGERY    . right thumb surgery      Social History   Socioeconomic History  . Marital status: Widowed    Spouse name: Not on file  . Number of children: Not on file  . Years of education: Not on file  . Highest education level: Not on file  Social Needs  . Financial resource strain: Not on file  . Food insecurity - worry: Not on file  . Food insecurity - inability: Not on file  . Transportation needs - medical: Not on file  . Transportation needs - non-medical: Not on file  Occupational History  . Not on file  Tobacco Use  . Smoking status: Never Smoker  . Smokeless tobacco: Never Used  Substance and Sexual Activity  . Alcohol use: No  . Drug use: No  . Sexual activity: No  Other Topics Concern  . Not on file  Social  History Narrative  . Not on file     Vitals:   06/05/17 1110  BP: 132/68  Pulse: (!) 55  SpO2: 100%  Weight: 136 lb 6.4 oz (61.9 kg)  Height: 5' (1.524 m)    Wt Readings from Last 3 Encounters:  06/05/17 136 lb 6.4 oz (61.9 kg)  01/21/17 133 lb 6.4 oz (60.5 kg)  01/20/17 136 lb (61.7 kg)     PHYSICAL EXAM General:  NAD HEENT: Normal. Neck: No JVD, no thyromegaly. Lungs: Clear to auscultation bilaterally with normal respiratory effort. CV: Regular rate and rhythm, normal S1/S2, no S3/S4, no murmur. No pretibial or periankle edema.  No carotid bruit.   Abdomen: Soft, nontender, no distention.  Neurologic: Alert and oriented.  Psych: Normal affect. Skin: Normal. Musculoskeletal: No gross deformities.    ECG: Most recent ECG reviewed.   Labs: Lab Results  Component Value Date/Time   K 4.0 06/18/2016 02:30 PM   BUN 36 (H) 06/18/2016 02:30 PM   CREATININE 1.07 (H) 06/18/2016 02:30 PM   CREATININE 1.00 (H) 01/11/2015 09:54 AM   ALT 16 07/02/2008 01:30 PM   HGB 15.0 01/03/2009 04:16 PM     Lipids: Lab Results  Component Value Date/Time   [facility]  01/04/2009 03:27 AM    65        Total Cholesterol/HDL:CHD Risk Coronary Heart Disease Risk Table                     Men   Women  1/2 Average Risk   3.4   3.3  Average Risk       5.0   4.4  2 X Average Risk   9.6   7.1  3 X Average Risk  23.4   11.0        Use the calculated Patient Ratio above and the CHD Risk Table to determine the patient's CHD Risk.        ATP III CLASSIFICATION (LDL):  <100     mg/dL   Optimal  100-129  mg/dL   Near or Above                    Optimal  130-159  mg/dL   Borderline  160-189  mg/dL   High  >190     mg/dL   Very High   CHOL  01/04/2009 03:27 AM    136        ATP III CLASSIFICATION:  <200     mg/dL   Desirable  200-239  mg/dL   Borderline High  >=240    mg/dL   High          TRIG 78 01/04/2009 03:27 AM   HDL 55 01/04/2009 03:27 AM       ASSESSMENT AND PLAN:  1.  Chest pain:  I am attempting to obtain records from her PCP with respect to her ECG and blood tests.  She underwent coronary angiography in 2008 which revealed nonobstructive disease. Lexiscan Cardiolite stress testing was normal in 08/2013. Echocardiogram demonstrated normal left ventricular systolic function and grade 2 diastolic function.  I will repeat a Lexiscan Myoview stress test to assess for ischemic heart disease.  I will also repeat an echocardiogram to assess for interval changes in left ventricular function and regional wall motion.  2. Essential hypertension: Well controlled on amlodipine 5 mg daily. No changes.  3. Tachycardia/palpitations: Symptomatically stable at present on metoprolol. No changes. Prior event monitoring did not demonstrate any supraventricular tachyarrhythmias. Symptoms correlated with sinus rhythm.   4. Bilateral leg edema: Continue Lasix 40 mg bid.  Compression stocking use is advised.    Disposition: Follow up 3 weeks   Kate Sable, M.D., F.A.C.C.

## 2017-06-05 NOTE — Patient Instructions (Signed)
Medication Instructions:  Continue all current medications.  Labwork: none  Testing/Procedures:  Your physician has requested that you have an echocardiogram. Echocardiography is a painless test that uses sound waves to create images of your heart. It provides your doctor with information about the size and shape of your heart and how well your heart's chambers and valves are working. This procedure takes approximately one hour. There are no restrictions for this procedure.  Your physician has requested that you have a lexiscan myoview. For further information please visit [email or web address]. Please follow instruction sheet, as given.  Office will contact with results via phone or letter.    Follow-Up: 2-3 weeks   Any Other Special Instructions Will Be Listed Below (If Applicable).  If you need a refill on your cardiac medications before your next appointment, please call your pharmacy.

## 2017-06-09 DIAGNOSIS — Z7901 Long term (current) use of anticoagulants: Secondary | ICD-10-CM | POA: Diagnosis not present

## 2017-06-09 DIAGNOSIS — R51 Headache: Secondary | ICD-10-CM | POA: Diagnosis not present

## 2017-06-10 ENCOUNTER — Other Ambulatory Visit: Payer: Self-pay

## 2017-06-10 ENCOUNTER — Ambulatory Visit (INDEPENDENT_AMBULATORY_CARE_PROVIDER_SITE_OTHER): Payer: PPO

## 2017-06-10 DIAGNOSIS — R079 Chest pain, unspecified: Secondary | ICD-10-CM | POA: Diagnosis not present

## 2017-06-12 ENCOUNTER — Encounter (HOSPITAL_COMMUNITY)
Admission: RE | Admit: 2017-06-12 | Discharge: 2017-06-12 | Disposition: A | Discharge status: Home / Self Care | Payer: PPO | Source: Ambulatory Visit | Attending: Cardiovascular Disease | Admitting: Cardiovascular Disease

## 2017-06-12 ENCOUNTER — Encounter (HOSPITAL_COMMUNITY): Payer: Self-pay

## 2017-06-12 ENCOUNTER — Encounter (HOSPITAL_BASED_OUTPATIENT_CLINIC_OR_DEPARTMENT_OTHER)
Admission: RE | Admit: 2017-06-12 | Discharge: 2017-06-12 | Disposition: A | Discharge status: Home / Self Care | Payer: PPO | Source: Ambulatory Visit | Attending: Cardiovascular Disease | Admitting: Cardiovascular Disease

## 2017-06-12 ENCOUNTER — Telehealth: Payer: Self-pay | Admitting: Cardiovascular Disease

## 2017-06-12 DIAGNOSIS — R079 Chest pain, unspecified: Secondary | ICD-10-CM

## 2017-06-12 LAB — NM MYOCAR MULTI W/SPECT W/WALL MOTION / EF
CHL CUP NUCLEAR SDS: 0
CHL CUP NUCLEAR SRS: 0
CHL CUP RESTING HR STRESS: 58 {beats}/min
LHR: 0.17
LV dias vol: 48 mL (ref 46–106)
LV sys vol: 12 mL
NUC STRESS TID: 0.98
Peak HR: 90 {beats}/min
SSS: 0

## 2017-06-12 MED ORDER — TECHNETIUM TC 99M TETROFOSMIN IV KIT
10.0000 | PACK | Freq: Once | INTRAVENOUS | Status: AC | PRN
  Administered 2017-06-12: 10.9 via INTRAVENOUS

## 2017-06-12 MED ORDER — TECHNETIUM TC 99M TETROFOSMIN IV KIT
30.0000 | PACK | Freq: Once | INTRAVENOUS | Status: AC | PRN
  Administered 2017-06-12: 30.2 via INTRAVENOUS

## 2017-06-12 MED ORDER — SODIUM CHLORIDE 0.9% FLUSH
INTRAVENOUS | Status: AC
  Administered 2017-06-12: 10 mL via INTRAVENOUS
  Filled 2017-06-12: qty 10

## 2017-06-12 MED ORDER — REGADENOSON 0.4 MG/5ML IV SOLN
INTRAVENOUS | Status: AC
  Administered 2017-06-12: 0.4 mg via INTRAVENOUS
  Filled 2017-06-12: qty 5

## 2017-06-12 NOTE — Telephone Encounter (Signed)
Returned call

## 2017-06-16 NOTE — Telephone Encounter (Signed)
Returning call.

## 2017-06-17 NOTE — Telephone Encounter (Signed)
-----   Message from Herminio Commons, MD sent at 06/12/2017  4:32 PM EST ----- Normal.

## 2017-06-17 NOTE — Telephone Encounter (Signed)
Called pt., no answer. Left message for pt to return call.  

## 2017-06-17 NOTE — Telephone Encounter (Signed)
Notes recorded by Laurine Blazer, LPN on 10/03/209 at 1:73 PM EST Patient notified via voice mail of normal echo.  Copy to pmd. Follow up scheduled for 07/13/2017 with Dr. Bronson Ing.

## 2017-07-07 DIAGNOSIS — I340 Nonrheumatic mitral (valve) insufficiency: Secondary | ICD-10-CM | POA: Diagnosis not present

## 2017-07-07 DIAGNOSIS — Z7901 Long term (current) use of anticoagulants: Secondary | ICD-10-CM | POA: Diagnosis not present

## 2017-07-07 DIAGNOSIS — Z86718 Personal history of other venous thrombosis and embolism: Secondary | ICD-10-CM | POA: Diagnosis not present

## 2017-07-07 DIAGNOSIS — R062 Wheezing: Secondary | ICD-10-CM | POA: Diagnosis not present

## 2017-07-13 ENCOUNTER — Ambulatory Visit: Payer: PPO | Admitting: Cardiovascular Disease

## 2017-07-13 ENCOUNTER — Encounter: Payer: Self-pay | Admitting: Cardiovascular Disease

## 2017-07-13 ENCOUNTER — Other Ambulatory Visit: Payer: Self-pay

## 2017-07-13 ENCOUNTER — Ambulatory Visit: Payer: Self-pay | Admitting: Cardiovascular Disease

## 2017-07-13 VITALS — BP 128/80 | HR 62 | Ht 60.0 in | Wt 138.0 lb

## 2017-07-13 DIAGNOSIS — R600 Localized edema: Secondary | ICD-10-CM | POA: Diagnosis not present

## 2017-07-13 DIAGNOSIS — I10 Essential (primary) hypertension: Secondary | ICD-10-CM

## 2017-07-13 DIAGNOSIS — R002 Palpitations: Secondary | ICD-10-CM | POA: Diagnosis not present

## 2017-07-13 DIAGNOSIS — R079 Chest pain, unspecified: Secondary | ICD-10-CM

## 2017-07-13 NOTE — Progress Notes (Signed)
SUBJECTIVE: The patient returns for follow-up after undergoing cardiovascular testing performed for the evaluation of chest pain.  Nuclear stress test was normal on 06/12/17.  Echocardiogram demonstrated normal left ventricular systolic function, LVEF 67-61%, mild LVH, indeterminate diastolic function, and mild tricuspid regurgitation.  She has episodic upper left-sided chest pain when bending over or lifting heavy objects.  She denies shortness of breath and leg swelling.      Review of Systems: As per "subjective", otherwise negative.  Allergies  Allergen Reactions  . Epinephrine Anaphylaxis  . Demerol [Meperidine] Other (See Comments)    headaches  . Vicodin [Hydrocodone-Acetaminophen] Other (See Comments)    [name] it makes her crazy    Current Outpatient Medications  Medication Sig Dispense Refill  . albuterol (PROVENTIL) (2.5 MG/3ML) 0.083% nebulizer solution Take 3 mLs (2.5 mg total) by nebulization every 6 (six) hours as needed for wheezing or shortness of breath. 75 mL 12  . amLODipine (NORVASC) 5 MG tablet TAKE 1 TABLET BY MOUTH EVERY DAY 30 tablet 6  . Ascorbic Acid (VITAMIN C) 1000 MG tablet Take 1,000 mg by mouth daily.    . B Complex Vitamins (VITAMIN B COMPLEX) TABS Take 1 tablet by mouth daily.    . Biotin 300 MCG TABS Take 1 tablet by mouth daily.    Marland Kitchen buPROPion (WELLBUTRIN XL) 150 MG 24 hr tablet Take 150 mg by mouth daily.    Marland Kitchen CALCIUM CARBONATE PO Take 1 tablet by mouth daily. Patient states medication is calcium 1200 mg without vitamin D.    . cholecalciferol (VITAMIN D) 1000 UNITS tablet Take 1,000 Units by mouth daily.    . CYANOCOBALAMIN PO Take 1 tablet by mouth daily. Patient unsure of strength    . ezetimibe (ZETIA) 10 MG tablet Take 10 mg by mouth daily.    . Ferrous Gluconate (IRON) 240 (27 FE) MG TABS Take 1 tablet by mouth daily.    . Flaxseed, Linseed, (FLAXSEED OIL) 1000 MG CAPS Take 1 capsule by mouth.     . furosemide (LASIX) 40 MG tablet  Take 1 tablet (40 mg total) by mouth 2 (two) times daily. 180 tablet 3  . Lecithin 1200 MG CAPS Take 1 capsule by mouth daily.    Marland Kitchen levothyroxine (SYNTHROID, LEVOTHROID) 75 MCG tablet Take 1 tablet by mouth daily.  4  . LUTEIN PO Take 1 capsule by mouth daily. Strength varies based on what is purchased at the time.    . Magnesium 250 MG TABS Take 1 tablet by mouth daily.    . metoprolol tartrate (LOPRESSOR) 25 MG tablet TAKE 1 TABLET BY MOUTH TWICE DAILY 60 tablet 3  . Nebulizers (COMPRESSOR/NEBULIZER) MISC Use as directed 1 each 0  . Omega-3 Fatty Acids (FISH OIL) 1200 MG CAPS Take 1,200 mg by mouth daily.    . pantoprazole (PROTONIX) 40 MG tablet Take 1 tablet by mouth daily as needed.    . potassium chloride (K-DUR) 10 MEQ tablet Take 10 mEq by mouth 2 (two) times daily.    . Turmeric 500 MG TABS Take by mouth daily.    Marland Kitchen warfarin (COUMADIN) 4 MG tablet Take 4 mg by mouth daily.    . Zinc 50 MG TABS Take 1 tablet by mouth daily.     No current facility-administered medications for this visit.     Past Medical History:  Diagnosis Date  . Adenomatous colon polyp 2008  . Asthma   . Complication of anesthesia   .  CVA (cerebral vascular accident) ([place])    sl weaker on rt foot  . Diverticulosis   . GERD (gastroesophageal reflux disease)   . Hemorrhoids 2007  . HTN (hypertension)   . Hyperplastic colon polyp 2005  . Hypothyroidism   . Narcolepsy   . PONV (postoperative nausea and vomiting)   . TIA (transient ischemic attack)     Past Surgical History:  Procedure Laterality Date  . ABDOMINAL HYSTERECTOMY     age 82, complicated by poor wound healing, followed by revision of scar and radiation for ?malignancy  . BREAST ENHANCEMENT SURGERY    . BREAST IMPLANT EXCHANGE Right 06/23/2016   Procedure: RIGHT BREAST IMPLANT REMOVAL AND REPLACEMENT;  Surgeon: Cristine Polio, MD;  Location: [place];  Service: Plastics;  Laterality: Right;  . COLONOSCOPY  2005   2  hyperplastic polyps  . COLONOSCOPY  2007   Dr. Oneida Alar- hemorrhoids  . COLONOSCOPY  2008   Dr. Reed Pandy polyp- rare sigmoid diverticulosis, internal hemorrhoids  . COLONOSCOPY  12/01/2011   Procedure: COLONOSCOPY;  Surgeon: Danie Binder, MD;  [facility];  Service: Endoscopy;  Laterality: N/A;  12:30 PM  . ESOPHAGOGASTRODUODENOSCOPY (EGD) WITH ESOPHAGEAL DILATION N/A 04/15/2013   Procedure: ESOPHAGOGASTRODUODENOSCOPY (EGD) WITH ESOPHAGEAL DILATION;  Surgeon: Danie Binder, MD;  Location: [facility];  Service: Endoscopy;  Laterality: N/A;  11:45-moved to 12:30 Darius Bump to notify pt  . EYE SURGERY  2010  . EYE SURGERY    . right thumb surgery      Social History   Socioeconomic History  . Marital status: Widowed    Spouse name: Not on file  . Number of children: Not on file  . Years of education: Not on file  . Highest education level: Not on file  Social Needs  . Financial resource strain: Not on file  . Food insecurity - worry: Not on file  . Food insecurity - inability: Not on file  . Transportation needs - medical: Not on file  . Transportation needs - non-medical: Not on file  Occupational History  . Not on file  Tobacco Use  . Smoking status: Never Smoker  . Smokeless tobacco: Never Used  Substance and Sexual Activity  . Alcohol use: No  . Drug use: No  . Sexual activity: No  Other Topics Concern  . Not on file  Social History Narrative  . Not on file     Vitals:   07/13/17 1404  BP: 128/80  Pulse: 62  SpO2: 98%  Weight: 138 lb (62.6 kg)  Height: 5' (1.524 m)    Wt Readings from Last 3 Encounters:  07/13/17 138 lb (62.6 kg)  06/05/17 136 lb 6.4 oz (61.9 kg)  01/21/17 133 lb 6.4 oz (60.5 kg)     PHYSICAL EXAM General: NAD HEENT: Normal. Neck: No JVD, no thyromegaly. Lungs: Clear to auscultation bilaterally with normal respiratory effort. CV: Regular rate and rhythm, normal S1/S2, no S3/S4, no murmur. No pretibial or  periankle edema.   Abdomen: Soft, nontender, no distention.  Neurologic: Alert and oriented.  Psych: Normal affect. Skin: Normal. Musculoskeletal: No gross deformities.    ECG: Most recent ECG reviewed.   Labs: Lab Results  Component Value Date/Time   K 4.0 06/18/2016 02:30 PM   BUN 36 (H) 06/18/2016 02:30 PM   CREATININE 1.07 (H) 06/18/2016 02:30 PM   CREATININE 1.00 (H) 01/11/2015 09:54 AM   ALT 16 07/02/2008 01:30 PM   HGB 15.0 01/03/2009  04:16 PM     Lipids: Lab Results  Component Value Date/Time   [facility]  01/04/2009 03:27 AM    65        Total Cholesterol/HDL:CHD Risk Coronary Heart Disease Risk Table                     Men   Women  1/2 Average Risk   3.4   3.3  Average Risk       5.0   4.4  2 X Average Risk   9.6   7.1  3 X Average Risk  23.4   11.0        Use the calculated Patient Ratio above and the CHD Risk Table to determine the patient's CHD Risk.        ATP III CLASSIFICATION (LDL):  <100     mg/dL   Optimal  100-129  mg/dL   Near or Above                    Optimal  130-159  mg/dL   Borderline  160-189  mg/dL   High  >190     mg/dL   Very High   CHOL  01/04/2009 03:27 AM    136        ATP III CLASSIFICATION:  <200     mg/dL   Desirable  200-239  mg/dL   Borderline High  >=240    mg/dL   High          TRIG 78 01/04/2009 03:27 AM   HDL 55 01/04/2009 03:27 AM       ASSESSMENT AND PLAN: 1.  Chest pain: Current symptoms are likely due to arthritis.  Normal nuclear stress test in January 2019 as noted above.  No further cardiac testing is indicated.  2.  Hypertension: Controlled on amlodipine 5 milligrams daily.  No changes.  3.  Tachycardia/palpitations: Symptomatically stable on metoprolol.  No changes.  4.  Bilateral leg edema: Continue Lasix 40 mg twice daily as per her preference.  Previously educated her on compression stockings.    Disposition: Follow up 1 year   Kate Sable, M.D., F.A.C.C.

## 2017-07-13 NOTE — Patient Instructions (Signed)

## 2017-08-04 DIAGNOSIS — J060 Acute laryngopharyngitis: Secondary | ICD-10-CM | POA: Diagnosis not present

## 2017-08-04 DIAGNOSIS — R079 Chest pain, unspecified: Secondary | ICD-10-CM | POA: Diagnosis not present

## 2017-08-04 DIAGNOSIS — J209 Acute bronchitis, unspecified: Secondary | ICD-10-CM | POA: Diagnosis not present

## 2017-08-04 DIAGNOSIS — Z6825 Body mass index (BMI) 25.0-25.9, adult: Secondary | ICD-10-CM | POA: Diagnosis not present

## 2017-08-04 DIAGNOSIS — Z7901 Long term (current) use of anticoagulants: Secondary | ICD-10-CM | POA: Diagnosis not present

## 2017-08-04 DIAGNOSIS — R062 Wheezing: Secondary | ICD-10-CM | POA: Diagnosis not present

## 2017-09-01 DIAGNOSIS — R599 Enlarged lymph nodes, unspecified: Secondary | ICD-10-CM | POA: Diagnosis not present

## 2017-09-01 DIAGNOSIS — L304 Erythema intertrigo: Secondary | ICD-10-CM | POA: Diagnosis not present

## 2017-09-01 DIAGNOSIS — G8929 Other chronic pain: Secondary | ICD-10-CM | POA: Diagnosis not present

## 2017-09-01 DIAGNOSIS — M545 Low back pain: Secondary | ICD-10-CM | POA: Diagnosis not present

## 2017-09-01 DIAGNOSIS — Z6826 Body mass index (BMI) 26.0-26.9, adult: Secondary | ICD-10-CM | POA: Diagnosis not present

## 2017-09-01 DIAGNOSIS — Z7901 Long term (current) use of anticoagulants: Secondary | ICD-10-CM | POA: Diagnosis not present

## 2017-09-01 DIAGNOSIS — M542 Cervicalgia: Secondary | ICD-10-CM | POA: Diagnosis not present

## 2017-09-01 DIAGNOSIS — M25552 Pain in left hip: Secondary | ICD-10-CM | POA: Diagnosis not present

## 2017-09-01 DIAGNOSIS — B372 Candidiasis of skin and nail: Secondary | ICD-10-CM | POA: Diagnosis not present

## 2017-09-29 DIAGNOSIS — E039 Hypothyroidism, unspecified: Secondary | ICD-10-CM | POA: Diagnosis not present

## 2017-09-29 DIAGNOSIS — M79602 Pain in left arm: Secondary | ICD-10-CM | POA: Diagnosis not present

## 2017-09-29 DIAGNOSIS — R55 Syncope and collapse: Secondary | ICD-10-CM | POA: Diagnosis not present

## 2017-09-29 DIAGNOSIS — M79601 Pain in right arm: Secondary | ICD-10-CM | POA: Diagnosis not present

## 2017-09-29 DIAGNOSIS — K5900 Constipation, unspecified: Secondary | ICD-10-CM | POA: Diagnosis not present

## 2017-09-29 DIAGNOSIS — R51 Headache: Secondary | ICD-10-CM | POA: Diagnosis not present

## 2017-09-29 DIAGNOSIS — R079 Chest pain, unspecified: Secondary | ICD-10-CM | POA: Diagnosis not present

## 2017-09-29 DIAGNOSIS — Z7901 Long term (current) use of anticoagulants: Secondary | ICD-10-CM | POA: Diagnosis not present

## 2017-10-02 DIAGNOSIS — M542 Cervicalgia: Secondary | ICD-10-CM | POA: Diagnosis not present

## 2017-10-02 DIAGNOSIS — R140 Abdominal distension (gaseous): Secondary | ICD-10-CM | POA: Diagnosis not present

## 2017-10-02 DIAGNOSIS — Z6826 Body mass index (BMI) 26.0-26.9, adult: Secondary | ICD-10-CM | POA: Diagnosis not present

## 2017-10-02 DIAGNOSIS — R600 Localized edema: Secondary | ICD-10-CM | POA: Diagnosis not present

## 2017-10-02 DIAGNOSIS — E782 Mixed hyperlipidemia: Secondary | ICD-10-CM | POA: Diagnosis not present

## 2017-10-02 DIAGNOSIS — M8580 Other specified disorders of bone density and structure, unspecified site: Secondary | ICD-10-CM | POA: Diagnosis not present

## 2017-10-02 DIAGNOSIS — R944 Abnormal results of kidney function studies: Secondary | ICD-10-CM | POA: Diagnosis not present

## 2017-10-02 DIAGNOSIS — E039 Hypothyroidism, unspecified: Secondary | ICD-10-CM | POA: Diagnosis not present

## 2017-10-02 DIAGNOSIS — B379 Candidiasis, unspecified: Secondary | ICD-10-CM | POA: Diagnosis not present

## 2017-10-02 DIAGNOSIS — M79604 Pain in right leg: Secondary | ICD-10-CM | POA: Diagnosis not present

## 2017-10-23 DIAGNOSIS — R944 Abnormal results of kidney function studies: Secondary | ICD-10-CM | POA: Diagnosis not present

## 2017-10-23 DIAGNOSIS — R140 Abdominal distension (gaseous): Secondary | ICD-10-CM | POA: Diagnosis not present

## 2017-10-23 DIAGNOSIS — R600 Localized edema: Secondary | ICD-10-CM | POA: Diagnosis not present

## 2017-10-23 DIAGNOSIS — M542 Cervicalgia: Secondary | ICD-10-CM | POA: Diagnosis not present

## 2017-10-23 DIAGNOSIS — M8580 Other specified disorders of bone density and structure, unspecified site: Secondary | ICD-10-CM | POA: Diagnosis not present

## 2017-10-23 DIAGNOSIS — Z6826 Body mass index (BMI) 26.0-26.9, adult: Secondary | ICD-10-CM | POA: Diagnosis not present

## 2017-10-23 DIAGNOSIS — Z8673 Personal history of transient ischemic attack (TIA), and cerebral infarction without residual deficits: Secondary | ICD-10-CM | POA: Diagnosis not present

## 2017-10-23 DIAGNOSIS — M79604 Pain in right leg: Secondary | ICD-10-CM | POA: Diagnosis not present

## 2017-10-23 DIAGNOSIS — E782 Mixed hyperlipidemia: Secondary | ICD-10-CM | POA: Diagnosis not present

## 2017-10-23 DIAGNOSIS — E039 Hypothyroidism, unspecified: Secondary | ICD-10-CM | POA: Diagnosis not present

## 2017-11-16 DIAGNOSIS — E039 Hypothyroidism, unspecified: Secondary | ICD-10-CM | POA: Diagnosis not present

## 2017-11-16 DIAGNOSIS — E782 Mixed hyperlipidemia: Secondary | ICD-10-CM | POA: Diagnosis not present

## 2017-11-27 DIAGNOSIS — E782 Mixed hyperlipidemia: Secondary | ICD-10-CM | POA: Diagnosis not present

## 2017-11-27 DIAGNOSIS — Z6825 Body mass index (BMI) 25.0-25.9, adult: Secondary | ICD-10-CM | POA: Diagnosis not present

## 2017-11-27 DIAGNOSIS — R944 Abnormal results of kidney function studies: Secondary | ICD-10-CM | POA: Diagnosis not present

## 2017-11-27 DIAGNOSIS — Z7901 Long term (current) use of anticoagulants: Secondary | ICD-10-CM | POA: Diagnosis not present

## 2017-11-27 DIAGNOSIS — Z8673 Personal history of transient ischemic attack (TIA), and cerebral infarction without residual deficits: Secondary | ICD-10-CM | POA: Diagnosis not present

## 2017-11-27 DIAGNOSIS — E039 Hypothyroidism, unspecified: Secondary | ICD-10-CM | POA: Diagnosis not present

## 2017-11-27 DIAGNOSIS — F39 Unspecified mood [affective] disorder: Secondary | ICD-10-CM | POA: Diagnosis not present

## 2017-11-27 DIAGNOSIS — R062 Wheezing: Secondary | ICD-10-CM | POA: Diagnosis not present

## 2017-11-27 DIAGNOSIS — I10 Essential (primary) hypertension: Secondary | ICD-10-CM | POA: Diagnosis not present

## 2017-11-27 DIAGNOSIS — K219 Gastro-esophageal reflux disease without esophagitis: Secondary | ICD-10-CM | POA: Diagnosis not present

## 2017-12-08 DIAGNOSIS — E039 Hypothyroidism, unspecified: Secondary | ICD-10-CM | POA: Diagnosis not present

## 2017-12-08 DIAGNOSIS — E782 Mixed hyperlipidemia: Secondary | ICD-10-CM | POA: Diagnosis not present

## 2017-12-08 DIAGNOSIS — R079 Chest pain, unspecified: Secondary | ICD-10-CM | POA: Diagnosis not present

## 2017-12-08 DIAGNOSIS — K5900 Constipation, unspecified: Secondary | ICD-10-CM | POA: Diagnosis not present

## 2017-12-08 DIAGNOSIS — M542 Cervicalgia: Secondary | ICD-10-CM | POA: Diagnosis not present

## 2017-12-08 DIAGNOSIS — Z86718 Personal history of other venous thrombosis and embolism: Secondary | ICD-10-CM | POA: Diagnosis not present

## 2017-12-08 DIAGNOSIS — M79602 Pain in left arm: Secondary | ICD-10-CM | POA: Diagnosis not present

## 2017-12-08 DIAGNOSIS — M79601 Pain in right arm: Secondary | ICD-10-CM | POA: Diagnosis not present

## 2017-12-08 DIAGNOSIS — R51 Headache: Secondary | ICD-10-CM | POA: Diagnosis not present

## 2017-12-08 DIAGNOSIS — R55 Syncope and collapse: Secondary | ICD-10-CM | POA: Diagnosis not present

## 2017-12-08 DIAGNOSIS — Z7901 Long term (current) use of anticoagulants: Secondary | ICD-10-CM | POA: Diagnosis not present

## 2017-12-08 DIAGNOSIS — Z6825 Body mass index (BMI) 25.0-25.9, adult: Secondary | ICD-10-CM | POA: Diagnosis not present

## 2017-12-21 DIAGNOSIS — Z8673 Personal history of transient ischemic attack (TIA), and cerebral infarction without residual deficits: Secondary | ICD-10-CM | POA: Diagnosis not present

## 2017-12-21 DIAGNOSIS — Z6825 Body mass index (BMI) 25.0-25.9, adult: Secondary | ICD-10-CM | POA: Diagnosis not present

## 2017-12-21 DIAGNOSIS — Z7901 Long term (current) use of anticoagulants: Secondary | ICD-10-CM | POA: Diagnosis not present

## 2018-01-04 DIAGNOSIS — M79601 Pain in right arm: Secondary | ICD-10-CM | POA: Diagnosis not present

## 2018-01-04 DIAGNOSIS — M79602 Pain in left arm: Secondary | ICD-10-CM | POA: Diagnosis not present

## 2018-01-04 DIAGNOSIS — R944 Abnormal results of kidney function studies: Secondary | ICD-10-CM | POA: Diagnosis not present

## 2018-01-04 DIAGNOSIS — Z7901 Long term (current) use of anticoagulants: Secondary | ICD-10-CM | POA: Diagnosis not present

## 2018-01-04 DIAGNOSIS — K5900 Constipation, unspecified: Secondary | ICD-10-CM | POA: Diagnosis not present

## 2018-01-04 DIAGNOSIS — R55 Syncope and collapse: Secondary | ICD-10-CM | POA: Diagnosis not present

## 2018-01-04 DIAGNOSIS — Z86718 Personal history of other venous thrombosis and embolism: Secondary | ICD-10-CM | POA: Diagnosis not present

## 2018-01-04 DIAGNOSIS — R51 Headache: Secondary | ICD-10-CM | POA: Diagnosis not present

## 2018-01-04 DIAGNOSIS — R079 Chest pain, unspecified: Secondary | ICD-10-CM | POA: Diagnosis not present

## 2018-01-04 DIAGNOSIS — M542 Cervicalgia: Secondary | ICD-10-CM | POA: Diagnosis not present

## 2018-01-04 DIAGNOSIS — E782 Mixed hyperlipidemia: Secondary | ICD-10-CM | POA: Diagnosis not present

## 2018-01-04 DIAGNOSIS — E039 Hypothyroidism, unspecified: Secondary | ICD-10-CM | POA: Diagnosis not present

## 2018-01-29 ENCOUNTER — Other Ambulatory Visit: Payer: Self-pay | Admitting: Cardiovascular Disease

## 2018-02-01 ENCOUNTER — Other Ambulatory Visit (HOSPITAL_COMMUNITY): Payer: Self-pay | Admitting: Internal Medicine

## 2018-02-01 DIAGNOSIS — R1011 Right upper quadrant pain: Secondary | ICD-10-CM

## 2018-02-01 DIAGNOSIS — R10811 Right upper quadrant abdominal tenderness: Secondary | ICD-10-CM | POA: Diagnosis not present

## 2018-02-01 DIAGNOSIS — Z7901 Long term (current) use of anticoagulants: Secondary | ICD-10-CM | POA: Diagnosis not present

## 2018-02-04 ENCOUNTER — Ambulatory Visit (HOSPITAL_COMMUNITY): Payer: PPO

## 2018-02-05 ENCOUNTER — Ambulatory Visit (HOSPITAL_COMMUNITY)
Admission: RE | Admit: 2018-02-05 | Discharge: 2018-02-05 | Disposition: A | Discharge status: Home / Self Care | Payer: PPO | Source: Ambulatory Visit | Attending: Internal Medicine | Admitting: Internal Medicine

## 2018-02-05 DIAGNOSIS — R1011 Right upper quadrant pain: Secondary | ICD-10-CM | POA: Insufficient documentation

## 2018-02-08 ENCOUNTER — Telehealth: Payer: Self-pay | Admitting: Gastroenterology

## 2018-02-08 NOTE — Telephone Encounter (Signed)
REVIEWED. I PERSONALLY REVIEWED THE CT 2016 and [place] Feb 05, 2018 WITH DR. Garald Balding TODAY. CHANGES ARE CLINICALLY STABLE.   OK WITH PT TRANSFER TO DR. Gala Romney.

## 2018-02-08 NOTE — Telephone Encounter (Signed)
I decline to accept pt

## 2018-02-08 NOTE — Telephone Encounter (Signed)
Pt called to say that she had an [place] on Friday and wanted to make an OV with [facility] ONLY. She is a [facility] patient and wants to switch to [facility] because she felt that [facility] wasn't interested in her care. She hasn't seen [facility] since AUG 2018. I explained to her that [facility] and [facility] don't see each other's patients and I couldn't schedule her with [facility] without approval that he was willing to accept her as a new patient first. Please advise if you are willing to see this lady and when? She said her [place] was abnormal. [phone]

## 2018-02-09 NOTE — Telephone Encounter (Signed)
PT HAS CHOSEN TO SEEK CARE ELSEWHERE. NO NEED TO SEND A DISCHARGE LETTER.

## 2018-02-09 NOTE — Telephone Encounter (Signed)
Noted  

## 2018-02-09 NOTE — Telephone Encounter (Addendum)
I made the patient aware and she stated she will have her pcp refer her to another GI doctor.  Dr. Oneida Alar is it ok to send the patient a discharge letter.

## 2018-03-01 DIAGNOSIS — Z7901 Long term (current) use of anticoagulants: Secondary | ICD-10-CM | POA: Diagnosis not present

## 2018-03-01 DIAGNOSIS — R1010 Upper abdominal pain, unspecified: Secondary | ICD-10-CM | POA: Diagnosis not present

## 2018-03-05 DIAGNOSIS — J060 Acute laryngopharyngitis: Secondary | ICD-10-CM | POA: Diagnosis not present

## 2018-03-05 DIAGNOSIS — Z6825 Body mass index (BMI) 25.0-25.9, adult: Secondary | ICD-10-CM | POA: Diagnosis not present

## 2018-03-18 ENCOUNTER — Other Ambulatory Visit (HOSPITAL_COMMUNITY): Payer: Self-pay | Admitting: Gastroenterology

## 2018-03-18 DIAGNOSIS — R194 Change in bowel habit: Secondary | ICD-10-CM | POA: Diagnosis not present

## 2018-03-18 DIAGNOSIS — R1011 Right upper quadrant pain: Secondary | ICD-10-CM | POA: Diagnosis not present

## 2018-03-18 DIAGNOSIS — Z800 Family history of malignant neoplasm of digestive organs: Secondary | ICD-10-CM | POA: Diagnosis not present

## 2018-03-18 DIAGNOSIS — R935 Abnormal findings on diagnostic imaging of other abdominal regions, including retroperitoneum: Secondary | ICD-10-CM | POA: Diagnosis not present

## 2018-03-18 DIAGNOSIS — Z8673 Personal history of transient ischemic attack (TIA), and cerebral infarction without residual deficits: Secondary | ICD-10-CM | POA: Diagnosis not present

## 2018-03-19 ENCOUNTER — Other Ambulatory Visit (HOSPITAL_COMMUNITY): Payer: Self-pay | Admitting: Gastroenterology

## 2018-03-19 DIAGNOSIS — R935 Abnormal findings on diagnostic imaging of other abdominal regions, including retroperitoneum: Secondary | ICD-10-CM

## 2018-03-26 ENCOUNTER — Other Ambulatory Visit (HOSPITAL_COMMUNITY): Payer: Self-pay | Admitting: Gastroenterology

## 2018-03-26 ENCOUNTER — Ambulatory Visit (HOSPITAL_COMMUNITY)
Admission: RE | Admit: 2018-03-26 | Discharge: 2018-03-26 | Disposition: A | Discharge status: Home / Self Care | Payer: PPO | Source: Ambulatory Visit | Attending: Gastroenterology | Admitting: Gastroenterology

## 2018-03-26 DIAGNOSIS — K7689 Other specified diseases of liver: Secondary | ICD-10-CM | POA: Insufficient documentation

## 2018-03-26 DIAGNOSIS — D1803 Hemangioma of intra-abdominal structures: Secondary | ICD-10-CM | POA: Insufficient documentation

## 2018-03-26 DIAGNOSIS — K838 Other specified diseases of biliary tract: Secondary | ICD-10-CM | POA: Insufficient documentation

## 2018-03-26 DIAGNOSIS — R935 Abnormal findings on diagnostic imaging of other abdominal regions, including retroperitoneum: Secondary | ICD-10-CM

## 2018-03-26 DIAGNOSIS — D1801 Hemangioma of skin and subcutaneous tissue: Secondary | ICD-10-CM | POA: Diagnosis not present

## 2018-03-26 DIAGNOSIS — R932 Abnormal findings on diagnostic imaging of liver and biliary tract: Secondary | ICD-10-CM | POA: Diagnosis not present

## 2018-03-26 LAB — POCT I-STAT CREATININE: CREATININE: 1.1 mg/dL — AB (ref 0.44–1.00)

## 2018-03-26 MED ORDER — SODIUM CHLORIDE 0.9 % IJ SOLN
INTRAMUSCULAR | Status: AC
  Filled 2018-03-26: qty 300

## 2018-03-26 MED ORDER — GADOBUTROL 1 MMOL/ML IV SOLN
7.0000 mL | Freq: Once | INTRAVENOUS | Status: AC | PRN
  Administered 2018-03-26: 7 mL via INTRAVENOUS

## 2018-03-29 DIAGNOSIS — Z7901 Long term (current) use of anticoagulants: Secondary | ICD-10-CM | POA: Diagnosis not present

## 2018-03-29 DIAGNOSIS — R109 Unspecified abdominal pain: Secondary | ICD-10-CM | POA: Diagnosis not present

## 2018-04-01 ENCOUNTER — Other Ambulatory Visit (HOSPITAL_COMMUNITY): Payer: Self-pay | Admitting: Gastroenterology

## 2018-04-01 DIAGNOSIS — R1011 Right upper quadrant pain: Secondary | ICD-10-CM

## 2018-04-05 ENCOUNTER — Telehealth (HOSPITAL_COMMUNITY): Payer: Self-pay | Admitting: Physical Therapy

## 2018-04-05 NOTE — Telephone Encounter (Signed)
Pt called our office to schedule MRI -gave pt Radiology number at [facility].

## 2018-04-09 ENCOUNTER — Encounter (HOSPITAL_COMMUNITY): Payer: Self-pay

## 2018-04-09 ENCOUNTER — Ambulatory Visit (HOSPITAL_COMMUNITY)
Admission: RE | Admit: 2018-04-09 | Discharge: 2018-04-09 | Disposition: A | Discharge status: Home / Self Care | Payer: PPO | Source: Ambulatory Visit | Attending: Gastroenterology | Admitting: Gastroenterology

## 2018-04-09 DIAGNOSIS — R1011 Right upper quadrant pain: Secondary | ICD-10-CM | POA: Insufficient documentation

## 2018-04-09 DIAGNOSIS — R109 Unspecified abdominal pain: Secondary | ICD-10-CM | POA: Diagnosis not present

## 2018-04-09 MED ORDER — TECHNETIUM TC 99M MEBROFENIN IV KIT
5.0000 | PACK | Freq: Once | INTRAVENOUS | Status: AC | PRN
  Administered 2018-04-09: 5.2 via INTRAVENOUS

## 2018-04-26 DIAGNOSIS — N289 Disorder of kidney and ureter, unspecified: Secondary | ICD-10-CM | POA: Diagnosis not present

## 2018-04-26 DIAGNOSIS — R109 Unspecified abdominal pain: Secondary | ICD-10-CM | POA: Diagnosis not present

## 2018-04-26 DIAGNOSIS — N130 Hydronephrosis with ureteropelvic junction obstruction: Secondary | ICD-10-CM | POA: Diagnosis not present

## 2018-04-26 DIAGNOSIS — D1809 Hemangioma of other sites: Secondary | ICD-10-CM | POA: Diagnosis not present

## 2018-04-26 DIAGNOSIS — Z7901 Long term (current) use of anticoagulants: Secondary | ICD-10-CM | POA: Diagnosis not present

## 2018-05-07 ENCOUNTER — Ambulatory Visit: Payer: Self-pay | Admitting: Urology

## 2018-05-14 DIAGNOSIS — E782 Mixed hyperlipidemia: Secondary | ICD-10-CM | POA: Diagnosis not present

## 2018-05-14 DIAGNOSIS — E039 Hypothyroidism, unspecified: Secondary | ICD-10-CM | POA: Diagnosis not present

## 2018-05-14 DIAGNOSIS — I10 Essential (primary) hypertension: Secondary | ICD-10-CM | POA: Diagnosis not present

## 2018-05-14 DIAGNOSIS — K219 Gastro-esophageal reflux disease without esophagitis: Secondary | ICD-10-CM | POA: Diagnosis not present

## 2018-05-17 DIAGNOSIS — R748 Abnormal levels of other serum enzymes: Secondary | ICD-10-CM | POA: Diagnosis not present

## 2018-05-17 DIAGNOSIS — R194 Change in bowel habit: Secondary | ICD-10-CM | POA: Diagnosis not present

## 2018-05-17 DIAGNOSIS — Z800 Family history of malignant neoplasm of digestive organs: Secondary | ICD-10-CM | POA: Diagnosis not present

## 2018-05-17 DIAGNOSIS — Z8673 Personal history of transient ischemic attack (TIA), and cerebral infarction without residual deficits: Secondary | ICD-10-CM | POA: Diagnosis not present

## 2018-05-17 DIAGNOSIS — R109 Unspecified abdominal pain: Secondary | ICD-10-CM | POA: Diagnosis not present

## 2018-05-28 DIAGNOSIS — R1084 Generalized abdominal pain: Secondary | ICD-10-CM | POA: Diagnosis not present

## 2018-05-28 DIAGNOSIS — Z7901 Long term (current) use of anticoagulants: Secondary | ICD-10-CM | POA: Diagnosis not present

## 2018-06-11 ENCOUNTER — Ambulatory Visit: Payer: PPO | Admitting: Urology

## 2018-06-11 DIAGNOSIS — N952 Postmenopausal atrophic vaginitis: Secondary | ICD-10-CM

## 2018-06-11 DIAGNOSIS — R3912 Poor urinary stream: Secondary | ICD-10-CM | POA: Diagnosis not present

## 2018-06-11 DIAGNOSIS — N1330 Unspecified hydronephrosis: Secondary | ICD-10-CM | POA: Diagnosis not present

## 2018-06-25 ENCOUNTER — Other Ambulatory Visit (HOSPITAL_COMMUNITY): Payer: Self-pay | Admitting: Urology

## 2018-06-25 DIAGNOSIS — N1330 Unspecified hydronephrosis: Secondary | ICD-10-CM

## 2018-06-30 DIAGNOSIS — R109 Unspecified abdominal pain: Secondary | ICD-10-CM | POA: Diagnosis not present

## 2018-06-30 DIAGNOSIS — Z7901 Long term (current) use of anticoagulants: Secondary | ICD-10-CM | POA: Diagnosis not present

## 2018-07-02 ENCOUNTER — Ambulatory Visit (HOSPITAL_COMMUNITY)
Admission: RE | Admit: 2018-07-02 | Discharge: 2018-07-02 | Disposition: A | Discharge status: Home / Self Care | Payer: PPO | Source: Ambulatory Visit | Attending: Urology | Admitting: Urology

## 2018-07-02 DIAGNOSIS — N1339 Other hydronephrosis: Secondary | ICD-10-CM | POA: Diagnosis not present

## 2018-07-02 DIAGNOSIS — N1330 Unspecified hydronephrosis: Secondary | ICD-10-CM

## 2018-07-02 MED ORDER — FUROSEMIDE 10 MG/ML IJ SOLN
INTRAMUSCULAR | Status: AC
  Administered 2018-07-02: 30 mg via INTRAVENOUS
  Filled 2018-07-02: qty 4

## 2018-07-02 MED ORDER — FUROSEMIDE 10 MG/ML IJ SOLN
30.0000 mg | Freq: Once | INTRAMUSCULAR | Status: AC
Start: 2018-07-02 — End: 2018-07-02
  Administered 2018-07-02: 30 mg via INTRAVENOUS

## 2018-07-02 MED ORDER — TECHNETIUM TC 99M MERTIATIDE
5.0000 | Freq: Once | INTRAVENOUS | Status: AC | PRN
  Administered 2018-07-02: 5.5 via INTRAVENOUS

## 2018-07-05 DIAGNOSIS — R194 Change in bowel habit: Secondary | ICD-10-CM | POA: Diagnosis not present

## 2018-07-05 DIAGNOSIS — R109 Unspecified abdominal pain: Secondary | ICD-10-CM | POA: Diagnosis not present

## 2018-07-05 DIAGNOSIS — R748 Abnormal levels of other serum enzymes: Secondary | ICD-10-CM | POA: Diagnosis not present

## 2018-07-05 DIAGNOSIS — Z800 Family history of malignant neoplasm of digestive organs: Secondary | ICD-10-CM | POA: Diagnosis not present

## 2018-07-05 DIAGNOSIS — Z8673 Personal history of transient ischemic attack (TIA), and cerebral infarction without residual deficits: Secondary | ICD-10-CM | POA: Diagnosis not present

## 2018-07-05 DIAGNOSIS — R1032 Left lower quadrant pain: Secondary | ICD-10-CM | POA: Diagnosis not present

## 2018-07-25 HISTORY — PX: BACK SURGERY: SHX140

## 2018-08-06 ENCOUNTER — Ambulatory Visit: Payer: PPO | Admitting: Urology

## 2018-08-06 DIAGNOSIS — R3912 Poor urinary stream: Secondary | ICD-10-CM | POA: Diagnosis not present

## 2018-08-06 DIAGNOSIS — Q6239 Other obstructive defects of renal pelvis and ureter: Secondary | ICD-10-CM

## 2018-08-06 DIAGNOSIS — R1031 Right lower quadrant pain: Secondary | ICD-10-CM

## 2018-08-09 DIAGNOSIS — Z823 Family history of stroke: Secondary | ICD-10-CM | POA: Diagnosis not present

## 2018-08-09 DIAGNOSIS — M546 Pain in thoracic spine: Secondary | ICD-10-CM | POA: Diagnosis not present

## 2018-08-09 DIAGNOSIS — S39011A Strain of muscle, fascia and tendon of abdomen, initial encounter: Secondary | ICD-10-CM | POA: Diagnosis not present

## 2018-08-09 DIAGNOSIS — R109 Unspecified abdominal pain: Secondary | ICD-10-CM | POA: Diagnosis not present

## 2018-08-09 DIAGNOSIS — Z7901 Long term (current) use of anticoagulants: Secondary | ICD-10-CM | POA: Diagnosis not present

## 2018-08-09 DIAGNOSIS — N289 Disorder of kidney and ureter, unspecified: Secondary | ICD-10-CM | POA: Diagnosis not present

## 2018-08-09 DIAGNOSIS — G8929 Other chronic pain: Secondary | ICD-10-CM | POA: Diagnosis not present

## 2018-08-09 DIAGNOSIS — M545 Low back pain: Secondary | ICD-10-CM | POA: Diagnosis not present

## 2018-08-09 DIAGNOSIS — N130 Hydronephrosis with ureteropelvic junction obstruction: Secondary | ICD-10-CM | POA: Diagnosis not present

## 2018-08-13 DIAGNOSIS — M545 Low back pain: Secondary | ICD-10-CM | POA: Diagnosis not present

## 2018-08-16 DIAGNOSIS — M62838 Other muscle spasm: Secondary | ICD-10-CM | POA: Diagnosis not present

## 2018-08-16 DIAGNOSIS — M545 Low back pain: Secondary | ICD-10-CM | POA: Diagnosis not present

## 2018-08-16 DIAGNOSIS — L603 Nail dystrophy: Secondary | ICD-10-CM | POA: Diagnosis not present

## 2018-08-18 ENCOUNTER — Emergency Department (HOSPITAL_COMMUNITY): Payer: PPO

## 2018-08-18 ENCOUNTER — Other Ambulatory Visit (HOSPITAL_COMMUNITY): Payer: Self-pay | Admitting: Internal Medicine

## 2018-08-18 ENCOUNTER — Encounter (HOSPITAL_COMMUNITY): Payer: Self-pay | Admitting: Emergency Medicine

## 2018-08-18 ENCOUNTER — Inpatient Hospital Stay (HOSPITAL_COMMUNITY)
Admission: EM | Admit: 2018-08-18 | Discharge: 2018-08-24 | DRG: 516 | Disposition: A | Discharge status: Skilled Nursing Facility | Payer: PPO | Attending: Internal Medicine | Admitting: Internal Medicine

## 2018-08-18 ENCOUNTER — Other Ambulatory Visit: Payer: Self-pay

## 2018-08-18 DIAGNOSIS — Z79899 Other long term (current) drug therapy: Secondary | ICD-10-CM | POA: Diagnosis not present

## 2018-08-18 DIAGNOSIS — R262 Difficulty in walking, not elsewhere classified: Secondary | ICD-10-CM | POA: Diagnosis not present

## 2018-08-18 DIAGNOSIS — S22080D Wedge compression fracture of T11-T12 vertebra, subsequent encounter for fracture with routine healing: Secondary | ICD-10-CM | POA: Diagnosis not present

## 2018-08-18 DIAGNOSIS — Z7901 Long term (current) use of anticoagulants: Secondary | ICD-10-CM | POA: Diagnosis not present

## 2018-08-18 DIAGNOSIS — Z4789 Encounter for other orthopedic aftercare: Secondary | ICD-10-CM | POA: Diagnosis not present

## 2018-08-18 DIAGNOSIS — K5900 Constipation, unspecified: Secondary | ICD-10-CM | POA: Diagnosis present

## 2018-08-18 DIAGNOSIS — F419 Anxiety disorder, unspecified: Secondary | ICD-10-CM | POA: Diagnosis present

## 2018-08-18 DIAGNOSIS — K5909 Other constipation: Secondary | ICD-10-CM | POA: Diagnosis not present

## 2018-08-18 DIAGNOSIS — M545 Low back pain, unspecified: Secondary | ICD-10-CM | POA: Diagnosis present

## 2018-08-18 DIAGNOSIS — Z885 Allergy status to narcotic agent status: Secondary | ICD-10-CM | POA: Diagnosis not present

## 2018-08-18 DIAGNOSIS — M4854XA Collapsed vertebra, not elsewhere classified, thoracic region, initial encounter for fracture: Secondary | ICD-10-CM | POA: Diagnosis present

## 2018-08-18 DIAGNOSIS — I10 Essential (primary) hypertension: Secondary | ICD-10-CM

## 2018-08-18 DIAGNOSIS — N200 Calculus of kidney: Secondary | ICD-10-CM | POA: Diagnosis not present

## 2018-08-18 DIAGNOSIS — F329 Major depressive disorder, single episode, unspecified: Secondary | ICD-10-CM | POA: Diagnosis present

## 2018-08-18 DIAGNOSIS — I130 Hypertensive heart and chronic kidney disease with heart failure and stage 1 through stage 4 chronic kidney disease, or unspecified chronic kidney disease: Secondary | ICD-10-CM | POA: Diagnosis present

## 2018-08-18 DIAGNOSIS — M549 Dorsalgia, unspecified: Secondary | ICD-10-CM

## 2018-08-18 DIAGNOSIS — M6281 Muscle weakness (generalized): Secondary | ICD-10-CM | POA: Diagnosis not present

## 2018-08-18 DIAGNOSIS — E039 Hypothyroidism, unspecified: Secondary | ICD-10-CM | POA: Diagnosis present

## 2018-08-18 DIAGNOSIS — M546 Pain in thoracic spine: Secondary | ICD-10-CM | POA: Diagnosis not present

## 2018-08-18 DIAGNOSIS — I5032 Chronic diastolic (congestive) heart failure: Secondary | ICD-10-CM | POA: Diagnosis present

## 2018-08-18 DIAGNOSIS — I69341 Monoplegia of lower limb following cerebral infarction affecting right dominant side: Secondary | ICD-10-CM

## 2018-08-18 DIAGNOSIS — K219 Gastro-esophageal reflux disease without esophagitis: Secondary | ICD-10-CM | POA: Diagnosis present

## 2018-08-18 DIAGNOSIS — S22080A Wedge compression fracture of T11-T12 vertebra, initial encounter for closed fracture: Secondary | ICD-10-CM | POA: Diagnosis present

## 2018-08-18 DIAGNOSIS — Z8042 Family history of malignant neoplasm of prostate: Secondary | ICD-10-CM

## 2018-08-18 DIAGNOSIS — S22088A Other fracture of T11-T12 vertebra, initial encounter for closed fracture: Secondary | ICD-10-CM | POA: Diagnosis not present

## 2018-08-18 DIAGNOSIS — M5136 Other intervertebral disc degeneration, lumbar region: Secondary | ICD-10-CM | POA: Diagnosis not present

## 2018-08-18 DIAGNOSIS — M5116 Intervertebral disc disorders with radiculopathy, lumbar region: Secondary | ICD-10-CM | POA: Diagnosis not present

## 2018-08-18 DIAGNOSIS — Z79890 Hormone replacement therapy (postmenopausal): Secondary | ICD-10-CM

## 2018-08-18 DIAGNOSIS — S22080G Wedge compression fracture of T11-T12 vertebra, subsequent encounter for fracture with delayed healing: Secondary | ICD-10-CM

## 2018-08-18 DIAGNOSIS — R52 Pain, unspecified: Secondary | ICD-10-CM

## 2018-08-18 DIAGNOSIS — Z800 Family history of malignant neoplasm of digestive organs: Secondary | ICD-10-CM | POA: Diagnosis not present

## 2018-08-18 DIAGNOSIS — Z741 Need for assistance with personal care: Secondary | ICD-10-CM | POA: Diagnosis not present

## 2018-08-18 DIAGNOSIS — N183 Chronic kidney disease, stage 3 (moderate): Secondary | ICD-10-CM | POA: Diagnosis not present

## 2018-08-18 LAB — COMPREHENSIVE METABOLIC PANEL
ALK PHOS: 89 U/L (ref 38–126)
ALT: 26 U/L (ref 0–44)
AST: 33 U/L (ref 15–41)
Albumin: 4.4 g/dL (ref 3.5–5.0)
Anion gap: 10 (ref 5–15)
BUN: 24 mg/dL — ABNORMAL HIGH (ref 8–23)
CO2: 30 mmol/L (ref 22–32)
Calcium: 9.4 mg/dL (ref 8.9–10.3)
Chloride: 101 mmol/L (ref 98–111)
Creatinine, Ser: 1.12 mg/dL — ABNORMAL HIGH (ref 0.44–1.00)
GFR calc Af Amer: 53 mL/min — ABNORMAL LOW (ref >60)
GFR calc non Af Amer: 46 mL/min — ABNORMAL LOW (ref >60)
Glucose, Bld: 107 mg/dL — ABNORMAL HIGH (ref 70–99)
Potassium: 4.3 mmol/L (ref 3.5–5.1)
Sodium: 141 mmol/L (ref 135–145)
Total Bilirubin: 0.6 mg/dL (ref 0.3–1.2)
Total Protein: 7.3 g/dL (ref 6.5–8.1)

## 2018-08-18 LAB — URINALYSIS, ROUTINE W REFLEX MICROSCOPIC
Bacteria, UA: NONE SEEN
Bilirubin Urine: NEGATIVE
Glucose, UA: NEGATIVE mg/dL
Hgb urine dipstick: NEGATIVE
KETONES UR: NEGATIVE mg/dL
Nitrite: NEGATIVE
Protein, ur: NEGATIVE mg/dL
Specific Gravity, Urine: 1.005 (ref 1.005–1.030)
pH: 8 (ref 5.0–8.0)

## 2018-08-18 LAB — CBC WITH DIFFERENTIAL/PLATELET
Abs Immature Granulocytes: 0.02 10*3/uL (ref 0.00–0.07)
Basophils Absolute: 0.1 10*3/uL (ref 0.0–0.1)
Basophils Relative: 1 %
Eosinophils Absolute: 0.2 10*3/uL (ref 0.0–0.5)
Eosinophils Relative: 3 %
HCT: 45.5 % (ref 36.0–46.0)
Hemoglobin: 14.6 g/dL (ref 12.0–15.0)
IMMATURE GRANULOCYTES: 0 %
Lymphocytes Relative: 17 %
Lymphs Abs: 1.2 10*3/uL (ref 0.7–4.0)
MCH: 28.9 pg (ref 26.0–34.0)
MCHC: 32.1 g/dL (ref 30.0–36.0)
MCV: 90.1 fL (ref 80.0–100.0)
MONOS PCT: 6 %
Monocytes Absolute: 0.4 10*3/uL (ref 0.1–1.0)
NEUTROS PCT: 73 %
Neutro Abs: 5.2 10*3/uL (ref 1.7–7.7)
Platelets: 300 10*3/uL (ref 150–400)
RBC: 5.05 MIL/uL (ref 3.87–5.11)
RDW: 13.7 % (ref 11.5–15.5)
WBC: 7 10*3/uL (ref 4.0–10.5)
nRBC: 0 % (ref 0.0–0.2)

## 2018-08-18 LAB — PROTIME-INR
INR: 2.9 — ABNORMAL HIGH (ref 0.8–1.2)
Prothrombin Time: 30 seconds — ABNORMAL HIGH (ref 11.4–15.2)

## 2018-08-18 MED ORDER — ONDANSETRON HCL 4 MG PO TABS: 4.0000 mg | ORAL_TABLET | Freq: Four times a day (QID) | ORAL | Status: DC | PRN

## 2018-08-18 MED ORDER — FERROUS SULFATE 325 (65 FE) MG PO TABS
325.0000 mg | ORAL_TABLET | Freq: Every day | ORAL | Status: DC
  Administered 2018-08-19 – 2018-08-24 (×6): 325 mg via ORAL
  Filled 2018-08-18 (×6): qty 1

## 2018-08-18 MED ORDER — MORPHINE SULFATE (PF) 2 MG/ML IV SOLN: 2.0000 mg | INTRAVENOUS | Status: DC | PRN

## 2018-08-18 MED ORDER — IBUPROFEN 400 MG PO TABS
400.0000 mg | ORAL_TABLET | Freq: Four times a day (QID) | ORAL | Status: DC | PRN
  Administered 2018-08-20: 400 mg via ORAL
  Filled 2018-08-18: qty 1

## 2018-08-18 MED ORDER — BUPROPION HCL ER (XL) 150 MG PO TB24
150.0000 mg | ORAL_TABLET | Freq: Every day | ORAL | Status: DC
  Administered 2018-08-19 – 2018-08-24 (×6): 150 mg via ORAL
  Filled 2018-08-18 (×6): qty 1

## 2018-08-18 MED ORDER — KETOROLAC TROMETHAMINE 15 MG/ML IJ SOLN
15.0000 mg | Freq: Once | INTRAMUSCULAR | Status: AC
  Administered 2018-08-19: 15 mg via INTRAVENOUS
  Filled 2018-08-18: qty 1

## 2018-08-18 MED ORDER — ONDANSETRON HCL 4 MG/2ML IJ SOLN: 4.0000 mg | Freq: Once | INTRAMUSCULAR | Status: DC

## 2018-08-18 MED ORDER — IOHEXOL 300 MG/ML  SOLN
75.0000 mL | Freq: Once | INTRAMUSCULAR | Status: AC | PRN
  Administered 2018-08-18: 75 mL via INTRAVENOUS

## 2018-08-18 MED ORDER — FUROSEMIDE 40 MG PO TABS
40.0000 mg | ORAL_TABLET | Freq: Every day | ORAL | Status: DC
  Administered 2018-08-19 – 2018-08-24 (×6): 40 mg via ORAL
  Filled 2018-08-18 (×6): qty 1

## 2018-08-18 MED ORDER — ACETAMINOPHEN 650 MG RE SUPP: 650.0000 mg | Freq: Four times a day (QID) | RECTAL | Status: DC | PRN

## 2018-08-18 MED ORDER — ONDANSETRON HCL 4 MG/2ML IJ SOLN
4.0000 mg | Freq: Once | INTRAMUSCULAR | Status: AC
  Administered 2018-08-18: 4 mg via INTRAVENOUS
  Filled 2018-08-18: qty 2

## 2018-08-18 MED ORDER — AMLODIPINE BESYLATE 5 MG PO TABS
5.0000 mg | ORAL_TABLET | Freq: Every day | ORAL | Status: DC
  Administered 2018-08-19 – 2018-08-24 (×6): 5 mg via ORAL
  Filled 2018-08-18 (×6): qty 1

## 2018-08-18 MED ORDER — VITAMIN D 25 MCG (1000 UNIT) PO TABS
1000.0000 [IU] | ORAL_TABLET | Freq: Every day | ORAL | Status: DC
  Administered 2018-08-19 – 2018-08-24 (×6): 1000 [IU] via ORAL
  Filled 2018-08-18 (×6): qty 1

## 2018-08-18 MED ORDER — KETOROLAC TROMETHAMINE 15 MG/ML IJ SOLN
15.0000 mg | Freq: Once | INTRAMUSCULAR | Status: AC
  Administered 2018-08-18: 15 mg via INTRAVENOUS
  Filled 2018-08-18: qty 1

## 2018-08-18 MED ORDER — EZETIMIBE 10 MG PO TABS
10.0000 mg | ORAL_TABLET | Freq: Every day | ORAL | Status: DC
  Administered 2018-08-19 – 2018-08-24 (×6): 10 mg via ORAL
  Filled 2018-08-18 (×6): qty 1

## 2018-08-18 MED ORDER — LEVOTHYROXINE SODIUM 50 MCG PO TABS
50.0000 ug | ORAL_TABLET | Freq: Every day | ORAL | Status: DC
  Administered 2018-08-19 – 2018-08-24 (×6): 50 ug via ORAL
  Filled 2018-08-18 (×6): qty 1

## 2018-08-18 MED ORDER — ACETAMINOPHEN 325 MG PO TABS: 650.0000 mg | ORAL_TABLET | Freq: Four times a day (QID) | ORAL | Status: DC | PRN

## 2018-08-18 MED ORDER — ONDANSETRON HCL 4 MG/2ML IJ SOLN: 4.0000 mg | Freq: Four times a day (QID) | INTRAMUSCULAR | Status: DC | PRN

## 2018-08-18 MED ORDER — DOCUSATE SODIUM 100 MG PO CAPS
100.0000 mg | ORAL_CAPSULE | Freq: Two times a day (BID) | ORAL | Status: DC
  Administered 2018-08-18 – 2018-08-20 (×4): 100 mg via ORAL
  Filled 2018-08-18 (×4): qty 1

## 2018-08-18 MED ORDER — POLYETHYLENE GLYCOL 3350 17 G PO PACK
17.0000 g | PACK | Freq: Every day | ORAL | Status: DC | PRN
  Administered 2018-08-19: 17 g via ORAL
  Filled 2018-08-18: qty 1

## 2018-08-18 MED ORDER — MORPHINE SULFATE (PF) 2 MG/ML IV SOLN
2.0000 mg | INTRAVENOUS | Status: DC | PRN
  Administered 2018-08-20 – 2018-08-23 (×2): 2 mg via INTRAVENOUS
  Filled 2018-08-18 (×2): qty 1

## 2018-08-18 MED ORDER — ALBUTEROL SULFATE (2.5 MG/3ML) 0.083% IN NEBU: 2.5000 mg | INHALATION_SOLUTION | Freq: Four times a day (QID) | RESPIRATORY_TRACT | Status: DC | PRN

## 2018-08-18 MED ORDER — VITAMIN C 500 MG PO TABS
1000.0000 mg | ORAL_TABLET | Freq: Every day | ORAL | Status: DC
  Administered 2018-08-19 – 2018-08-24 (×6): 1000 mg via ORAL
  Filled 2018-08-18 (×6): qty 2

## 2018-08-18 MED ORDER — METOPROLOL TARTRATE 25 MG PO TABS
12.5000 mg | ORAL_TABLET | Freq: Two times a day (BID) | ORAL | Status: DC
  Administered 2018-08-18 – 2018-08-24 (×12): 12.5 mg via ORAL
  Filled 2018-08-18 (×12): qty 1

## 2018-08-18 NOTE — ED Triage Notes (Signed)
Pt states lower back pain x1 week after picking up some water at home with pain that radiates down both legs. PT states she went to Dr. [place] Crane office and they put in [name] but pt states she has been hurting for a week and wants to come to ED to be seen. PT drove herself to ED today and ambulated into waiting room. PT denies any urinary symptoms and states last BM 08/16/18.

## 2018-08-18 NOTE — ED Notes (Signed)
Patient ambulatory to bathroom and another 800 cc urine.

## 2018-08-18 NOTE — ED Provider Notes (Signed)
[facility] EMERGENCY DEPARTMENT Provider Note   CSN: [id number] Arrival date & time: 08/18/18  3382    History   Chief Complaint Chief Complaint  Patient presents with  . Back Pain    HPI [name] is a 83 y.o. female with a past medical history as outlined below, most significant for asthma, hypertension, history of TIA and also has chronic abdominal pain who is under the care of Dr. Alessandra Bevels presenting with a one-week history of worsening mid to lower back pain.  She has a history of a chronic back pain as well, however 1 week ago was lifting a case of water when she felt sudden onset of pain midline radiating down into her bilateral legs to her lower thighs.  She denies weakness or numbness in her legs and has been ambulatory.  She denies any urinary incontinence or retention.  She was seen by her PCP 2 days ago at which time she was started on a medication that she cannot recall the name but it has not been helpful.  She was also seen again yesterday and was placed on cyclobenzaprine which she states is also not helping.  She has been using a heating pad consistently without relief.     The history is provided by the patient.    Past Medical History:  Diagnosis Date  . Adenomatous colon polyp 2008  . Asthma   . Complication of anesthesia   . CVA (cerebral vascular accident) ([place])    sl weaker on rt foot  . Diverticulosis   . GERD (gastroesophageal reflux disease)   . Hemorrhoids 2007  . HTN (hypertension)   . Hyperplastic colon polyp 2005  . Hypothyroidism   . Narcolepsy   . PONV (postoperative nausea and vomiting)   . TIA (transient ischemic attack)     Patient Active Problem List   Diagnosis Date Noted  . Breast cancer ([place]) 06/23/2016  . Change in bowel habits 01/11/2015  . Loss of weight 01/11/2015  . Abdominal pain, chronic, right upper quadrant 01/11/2015  . Unspecified chronic bronchitis ([place]) 09/25/2013  . Other dysphagia 04/01/2013  . Cough  01/20/2013  . Thrush 10/28/2012  . Chest pain 09/26/2012  . H/O adenomatous polyp of colon 11/18/2011  . Chronic anticoagulation 11/18/2011    Past Surgical History:  Procedure Laterality Date  . ABDOMINAL HYSTERECTOMY     age 7, complicated by poor wound healing, followed by revision of scar and radiation for ?malignancy  . BREAST ENHANCEMENT SURGERY    . BREAST IMPLANT EXCHANGE Right 06/23/2016   Procedure: RIGHT BREAST IMPLANT REMOVAL AND REPLACEMENT;  Surgeon: Cristine Polio, MD;  Location: [place];  Service: Plastics;  Laterality: Right;  . COLONOSCOPY  2005   2 hyperplastic polyps  . COLONOSCOPY  2007   Dr. Oneida Alar- hemorrhoids  . COLONOSCOPY  2008   Dr. Reed Pandy polyp- rare sigmoid diverticulosis, internal hemorrhoids  . COLONOSCOPY  12/01/2011   Procedure: COLONOSCOPY;  Surgeon: Danie Binder, MD;  [facility];  Service: Endoscopy;  Laterality: N/A;  12:30 PM  . ESOPHAGOGASTRODUODENOSCOPY (EGD) WITH ESOPHAGEAL DILATION N/A 04/15/2013   Procedure: ESOPHAGOGASTRODUODENOSCOPY (EGD) WITH ESOPHAGEAL DILATION;  Surgeon: Danie Binder, MD;  [facility];  Service: Endoscopy;  Laterality: N/A;  11:45-moved to 12:30 Darius Bump to notify pt  . EYE SURGERY  2010  . EYE SURGERY    . right thumb surgery       OB History    Gravida  Para      Term      Preterm      AB      Living  1     SAB      TAB      Ectopic      Multiple      Live Births               Home Medications    Prior to Admission medications   Medication Sig Start Date End Date Taking? Authorizing Provider  albuterol (PROVENTIL) (2.5 MG/3ML) 0.083% nebulizer solution Take 3 mLs (2.5 mg total) by nebulization every 6 (six) hours as needed for wheezing or shortness of breath. 06/27/13  Yes Baird Lyons D, MD  amLODipine (NORVASC) 5 MG tablet TAKE 1 TABLET BY MOUTH EVERY DAY 11/21/16  Yes Herminio Commons, MD  Ascorbic Acid (VITAMIN C)  1000 MG tablet Take 1,000 mg by mouth daily.   Yes [provider]  B Complex Vitamins (VITAMIN B COMPLEX) TABS Take 1 tablet by mouth daily.   Yes [provider]  Biotin 300 MCG TABS Take 1 tablet by mouth daily after supper.    Yes [provider]  buPROPion (WELLBUTRIN XL) 150 MG 24 hr tablet Take 150 mg by mouth daily. 01/15/17  Yes [provider]  CALCIUM CARBONATE PO Take 1 tablet by mouth daily. Patient states medication is calcium 1200 mg without vitamin D.   Yes [provider]  cholecalciferol (VITAMIN D) 1000 UNITS tablet Take 1,000 Units by mouth daily.   Yes [provider]  CYANOCOBALAMIN PO Take 1 tablet by mouth daily. Patient unsure of strength   Yes [provider]  ezetimibe (ZETIA) 10 MG tablet Take 10 mg by mouth daily.   Yes [provider]  Ferrous Gluconate (IRON) 240 (27 FE) MG TABS Take 1 tablet by mouth daily.   Yes [provider]  Flaxseed, Linseed, (FLAXSEED OIL) 1000 MG CAPS Take 1 capsule by mouth.    Yes [provider]  furosemide (LASIX) 20 MG tablet Take 20 mg by mouth daily after supper.   Yes [provider]  furosemide (LASIX) 40 MG tablet TAKE 1 TABLET BY MOUTH TWICE DAILY Patient taking differently: 40 mg daily.  01/29/18  Yes Herminio Commons, MD  Lecithin 1200 MG CAPS Take 1 capsule by mouth daily.   Yes [provider]  levothyroxine (SYNTHROID, LEVOTHROID) 75 MCG tablet Take 50 mcg by mouth daily.  05/12/17  Yes [provider]  LUTEIN PO Take 1 capsule by mouth daily. Strength varies based on what is purchased at the time.   Yes [provider]  Magnesium 250 MG TABS Take 1 tablet by mouth daily.   Yes [provider]  metoprolol tartrate (LOPRESSOR) 25 MG tablet TAKE 1 TABLET BY MOUTH TWICE DAILY Patient taking differently: 12.5 mg 2 (two) times daily.  07/03/16  Yes Herminio Commons, MD  Nebulizers  (COMPRESSOR/NEBULIZER) MISC Use as directed 06/27/13  Yes Young, Kasandra Knudsen, MD  Omega-3 Fatty Acids (FISH OIL) 1200 MG CAPS Take 1,200 mg by mouth daily.   Yes [provider]  pantoprazole (PROTONIX) 40 MG tablet Take 1 tablet by mouth daily as needed. 06/03/17  Yes [provider]  potassium chloride (K-DUR) 10 MEQ tablet Take 10 mEq by mouth 2 (two) times daily.   Yes [provider]  Turmeric 500 MG TABS Take by mouth daily.   Yes [provider]  warfarin (COUMADIN) 4 MG tablet Take 4 mg by mouth daily.   Yes [provider]  Zinc 50 MG TABS Take 1 tablet by mouth daily.   Yes [provider]    Family History Family History  Problem Relation Age of Onset  . Colon cancer Father        age 26  . Prostate cancer Father   . Pancreatic cancer Mother        age 3    Social History Social History   Tobacco Use  . Smoking status: Never Smoker  . Smokeless tobacco: Never Used  Substance Use Topics  . Alcohol use: No  . Drug use: No     Allergies   Epinephrine; Demerol [meperidine]; and Vicodin [hydrocodone-acetaminophen]   Review of Systems Review of Systems  Constitutional: Negative for fever.  Respiratory: Negative for shortness of breath.   Cardiovascular: Negative for chest pain and leg swelling.  Gastrointestinal: Negative for abdominal distention, abdominal pain and constipation.  Genitourinary: Negative for difficulty urinating, dysuria, flank pain, frequency and urgency.  Musculoskeletal: Positive for back pain. Negative for gait problem and joint swelling.  Skin: Negative for rash.  Neurological: Negative for weakness and numbness.     Physical Exam Updated Vital Signs BP (!) 171/78   Pulse 69   Temp 97.7 F (36.5 C) (Oral)   Resp 16   Ht 5\' 3"  (1.6 m)   Wt 59 kg   SpO2 94%   BMI 23.03 kg/m   Physical Exam Vitals signs and nursing note reviewed.  Constitutional:      Appearance: She is  well-developed.  HENT:     Head: Normocephalic.  Eyes:     Conjunctiva/sclera: Conjunctivae normal.  Neck:     Musculoskeletal: Normal range of motion and neck supple.  Cardiovascular:     Rate and Rhythm: Normal rate.     Comments: Pedal pulses normal. Pulmonary:     Effort: Pulmonary effort is normal.  Abdominal:     General: Bowel sounds are normal. There is no distension.     Palpations: Abdomen is soft. There is no mass.     Comments: Abdomen distended, soft.  Nontender without mass (baseline per pt report).   Musculoskeletal: Normal range of motion.     Thoracic back: She exhibits bony tenderness. She exhibits no swelling, no edema, no deformity and no spasm.     Lumbar back: She exhibits bony tenderness. She exhibits no swelling, no edema, no deformity and no spasm.  Skin:    General: Skin is warm and dry.  Neurological:     Mental Status: She is alert.     Sensory: No sensory deficit.     Motor: No tremor or atrophy.     Gait: Gait normal.     Deep Tendon Reflexes:     Reflex Scores:      Patellar reflexes are 2+ on the right side and 2+ on the left side.      Achilles reflexes are 2+ on the right side and 2+ on the left side.    Comments: No strength deficit noted in hip and knee flexor and extensor muscle groups.  Ankle flexion and extension intact.      ED Treatments / Results  Labs (all labs ordered are listed, but only abnormal results are displayed) Labs Reviewed  URINALYSIS, ROUTINE W REFLEX MICROSCOPIC - Abnormal; Notable for the following components:      Result Value   Color,  Urine STRAW (*)    Leukocytes,Ua TRACE (*)    All other components within normal limits  COMPREHENSIVE METABOLIC PANEL - Abnormal; Notable for the following components:   Glucose, Bld 107 (*)    BUN 24 (*)    Creatinine, Ser 1.12 (*)    GFR calc non Af Amer 46 (*)    GFR calc Af Amer 53 (*)    All other components within normal limits  CBC WITH DIFFERENTIAL/PLATELET    EKG  None  Radiology Dg Thoracic Spine W/swimmers  Result Date: 08/18/2018 CLINICAL DATA:  Thoracic spine pain since lifting water a week ago. EXAM: THORACIC SPINE - 3 VIEWS COMPARISON:  CT chest 09/20/2012 and CT abdomen and pelvis 01/15/2015. FINDINGS: S shaped thoracolumbar scoliosis is identified. Remote T11 and T12 compression fractures are noted. No acute fracture. Mild multilevel degenerative change noted. IMPRESSION: No acute finding. S shaped scoliosis. Mild, remote T11 and T12 compression fractures. Electronically Signed   By: Inge Rise M.D.   On: 08/18/2018 10:34   Dg Lumbar Spine Complete  Result Date: 08/18/2018 CLINICAL DATA:  Low back pain for 1 week radiating into both legs after lifting water. EXAM: LUMBAR SPINE - COMPLETE 4+ VIEW COMPARISON:  CT abdomen and pelvis 01/15/2015. FINDINGS: Convex left scoliosis is unchanged. The patient has remote T12 superior endplate and G18 inferior endplate compression fractures which are unchanged. Lower lumbar facet arthropathy is noted. Mild loss of disc space height is seen at L5-S1. Paraspinous structures demonstrate a stone in the lower pole of the left kidney, unchanged. Massive volume of stool throughout the colon is seen. IMPRESSION: No acute abnormality or change compared to the prior exam. Mild, remote T11 and T12 compression fractures. Lower lumbar spondylosis. Nonobstructing stone left kidney, unchanged. Very large volume of stool throughout the colon. Electronically Signed   By: Inge Rise M.D.   On: 08/18/2018 10:30   Ct Abdomen Pelvis W Contrast  Result Date: 08/18/2018 CLINICAL DATA:  Right greater than left lower abdominal pain. EXAM: CT ABDOMEN AND PELVIS WITH CONTRAST TECHNIQUE: Multidetector CT imaging of the abdomen and pelvis was performed using the standard protocol following bolus administration of intravenous contrast. CONTRAST:  66mL OMNIPAQUE IOHEXOL 300 MG/ML  SOLN COMPARISON:  MRI abdomen dated March 26, 2018.  CT abdomen pelvis dated January 15, 2015. FINDINGS: Lower chest: No acute abnormality. Hepatobiliary: Multiple hepatic simple cysts and small hemangiomas are unchanged. Unchanged small gallbladder polyp versus gallstone. No gallbladder wall thickening. Unchanged mild common bile duct and central intrahepatic biliary dilatation. Pancreas: Unremarkable. No pancreatic ductal dilatation or surrounding inflammatory changes. Spleen: Normal in size without focal abnormality. Adrenals/Urinary Tract: The adrenal glands are unremarkable. There is a 1.2 cm exophytic, high-attenuating lesion arising from the midpole of the right kidney, previously characterized as a hemorrhagic/proteinaceous cyst on prior MRI. Subcentimeter low-density lesions in both kidneys remain too small to characterize, but are unchanged. Slight interval enlargement of the nonobstructive calculi in the lower pole of the left kidney and midpole of the right kidney. Decreased mild left hydronephrosis to the level of the UPJ. No right hydronephrosis. The bladder is unremarkable for the degree of distention. Stomach/Bowel: Stomach is within normal limits. Appendix is surgically absent. No evidence of bowel wall thickening, distention, or inflammatory changes. Moderate left-sided colonic diverticulosis. Large colonic stool burden. Vascular/Lymphatic: Aortic atherosclerosis. No enlarged abdominal or pelvic lymph nodes. Reproductive: Status post hysterectomy. No adnexal masses. Other: No abdominal wall hernia or abnormality. No abdominopelvic ascites. No pneumoperitoneum. Musculoskeletal: No  acute or significant osseous findings. Slight interval progression of the now moderate T12 compression deformity since November 2019. IMPRESSION: 1.  No acute intra-abdominal process. 2. Prominent stool throughout the colon. Correlate for constipation. 3. Slight interval increase in size of bilateral nonobstructive nephrolithiasis. 4. Slight interval progression of the now  moderate T12 compression deformity since November 2019. Electronically Signed   By: Titus Dubin M.D.   On: 08/18/2018 15:22    Procedures Procedures (including critical care time)  Medications Ordered in ED Medications  ondansetron (ZOFRAN) injection 4 mg (has no administration in time range)  morphine 2 MG/ML injection 2 mg (has no administration in time range)  ondansetron (ZOFRAN) injection 4 mg (4 mg Intravenous Given 08/18/18 1241)  iohexol (OMNIPAQUE) 300 MG/ML solution 75 mL (75 mLs Intravenous Contrast Given 08/18/18 1455)     Initial Impression / Assessment and Plan / ED Course  I have reviewed the triage vital signs and the nursing notes.  Pertinent labs & imaging results that were available during my care of the patient were reviewed by me and considered in my medical decision making (see chart for details).        Review of chart, ct imaging 11/19 negative for abdominal vascular disease/aneurysm.  Imaging today, old compression fractures T11/T12.  Large stool.  At re-exam,  Pt states her abdominal pain is worsening, localizing to the RLQ.  Also feels she cannot empty her bladder, has a hx of a "blockage" left ureter, followed by Dr Jeffie Pollock.  Ct abd/bladder scan ordered.   Patient has had several episodes of urination while here, controlled, no loss of bladder control.  She also has no bladder residual post void per bladder scanning.  Review of CT scan and labs reviewed and discussed with patient.  She is unaware of having a diagnosis of thoracic compression fracture, but has had chronic pain for a while severe since picking up a case of water last week.  She has no neuro deficits on today's exam, although was unable to ambulate patient secondary to pain.  Discussed CT results with Dr. Hyacinth Meeker of interventional radiology.  He would need a thoracic MRI to be able to determine whether she would be a kyphoplasty candidate.  If the MRI shows edema then a referral to Dr. Patrecia Pour for  consideration of this procedure would be a good plan.  Call to [facility], pt picked up a prescription for flexeril on 3/20.  Yesterday, was started on tizanidine.  Pt reports neither medicine has made a difference.  Attempts to get patient is sitting on the edge of the bed were unsuccessful secondary to severe pain.  Patient drove herself here and she lives alone.  Unable to give patient any potentially sedating medications for pain control and anticipate being able to DC home.  Explored possible friends are family who could help her at home with ADLs.  She states she has only 1 brother locally who is in worse physical condition than her and is not in a position to be able to help her.  Discussed with attending and will arrange for admission for further testing, OT/PT and MRI as recommended by interventional radiology.  Discussed with Dr. Velia Meyer who will accept patient to his service.  Final Clinical Impressions(s) / ED Diagnoses   Final diagnoses:  Compression fracture of T12 vertebra, initial encounter [facility])    ED Discharge Orders    None       Landis Martins 08/18/18 1627    Francine Graven,  DO 08/23/18 1804

## 2018-08-18 NOTE — ED Notes (Signed)
Patient ambulatory to restroom.  Patient had urine of 800 cc

## 2018-08-18 NOTE — ED Notes (Signed)
Quincy Sheehan, brother  [phone]  "Contact if anything changes or updates"

## 2018-08-18 NOTE — H&P (Addendum)
History and Physical    PLEASE NOTE THAT DRAGON DICTATION SOFTWARE WAS USED IN THE CONSTRUCTION OF THIS NOTE.   [name] PFX:[id number] DOB: 22-Dec-1935 DOA: 08/18/2018  PCP: Celene Squibb, MD Patient coming from: home  I have personally briefly reviewed patient's old medical records in [place]  Chief Complaint: low back pain  HPI: [name] is a 83 y.o. female with medical history significant for multiple acute ischemic CVAs prompting chronic anticoagulation on warfarin, chronic abdominal pain, acquired hypothyroidism, hypertension, chronic diastolic heart failure, who is admitted to [facility] on 08/18/2018 for pain control in setting of progression of T12 compression fracture at presenting from home to [facility] ED complaining of low back pain.   The patient reports that she has been experiencing low back pain for approximately 1 year, but developed significant exacerbation there of 1 week ago after attempting to lift a case of water off of a shelf in front of her.  In performing dissection, the patient reports immediate exacerbation of her low back discomfort, which is gradually progressed over the ensuing 1 week to the point where she is no longer able to ambulate, shower, or prepare her own food due to insufficient pain control as opposed to any acute focal weakness.  She conveys her concern regarding the above as she reports that she lives by herself, functions completely independently at baseline, and has no local family they could offer her temporary assistance.   She reports that the low back pain has been constant over the course of the last week, and worsens with any kind of movement as well as with ambulation.  She reports radiation of the low, midline back pain, into the groin bilaterally, as well as down the lateral aspect of her left lower extremity.  She denies any associated fecal incontinence, urinary retention, or saddle anesthesia.  Over  the course of the last year, the patient reports that she has tried opioid painkillers as management of her low back discomfort, but states that these have offered her little benefit in terms of pain control, while subjecting her to undesirable side effects such as somnolence that has impaired her ability for independent ADL completion.  Over the last week, the patient reports that she has tried taking as needed Tylenol, which has offered little benefit in terms of pain control.  Additionally, she has tried heating pads, which likewise have offered little improvement in her discomfort. She has not tried any recent NSAIDs.  Denies any recent falls or additional trauma.   The patient also reports chronic abdominal pain for several years, without any significant recent worsening thereof.  In the setting of a relative decline in her ambulatory status over the course of the last week, she has noted relative constipation, with most recent bowel movement occurring 2 days prior to today's presentation.    ED Course: Vital signs in the emergency department were notable for the following: Temperature max 97.7; heart rate 57-68; blood pressure ranged from 149/71-160 4/87; respiratory rate 16-18, and oxygen saturation 96 to 99% on room air.  Labs in the ED were notable for the following: CMP notable for sodium 141, creatinine 1.12 relative to baseline creatinine of 1.0-1.1.  CBC notable for white blood cell count of 7000.  Urinalysis showed no evidence of white blood cells, no bacteria, and was nitrite negative.  In the context of the patient's abdominal discomfort, is further described above, CT abdomen/pelvis with contrast was pursued, and per  final radiology report showed no evidence of acute intra-abdominal process, but did show evidence of prominent stool in the absence of any evidence of obstruction.  CT abdomen/pelvis, relative to MRI from November 2019, all shows showed slight interval progression of the now  moderate T12 compression fracture.  In response to this progression of T12 compression fracture with worsening symptoms of low back discomfort, the ED provider discussed the patient's case and imaging with the on-call interventional radiologist, Dr. Hyacinth Meeker, who recommended obtaining MRI of thoracic spine to further evaluate the patient's candidacy for kyphoplasty.   Of note, while still in the ED, the patient voided 800 cc a urine, with post void residual scan revealing 0 cc's of retained urine.     Review of Systems: As per HPI otherwise 10 point review of systems negative.   Past Medical History:  Diagnosis Date   Adenomatous colon polyp 2008   Asthma    Complication of anesthesia    CVA (cerebral vascular accident) ([place])    sl weaker on rt foot   Diverticulosis    GERD (gastroesophageal reflux disease)    Hemorrhoids 2007   HTN (hypertension)    Hyperplastic colon polyp 2005   Hypothyroidism    Narcolepsy    PONV (postoperative nausea and vomiting)    TIA (transient ischemic attack)     Past Surgical History:  Procedure Laterality Date   ABDOMINAL HYSTERECTOMY     age 36, complicated by poor wound healing, followed by revision of scar and radiation for ?malignancy   BREAST ENHANCEMENT SURGERY     BREAST IMPLANT EXCHANGE Right 06/23/2016   Procedure: RIGHT BREAST IMPLANT REMOVAL AND REPLACEMENT;  Surgeon: Cristine Polio, MD;  Location: [facility];  Service: Plastics;  Laterality: Right;   COLONOSCOPY  2005   2 hyperplastic polyps   COLONOSCOPY  2007   Dr. Oneida Alar- hemorrhoids   COLONOSCOPY  2008   Dr. Reed Pandy polyp- rare sigmoid diverticulosis, internal hemorrhoids   COLONOSCOPY  12/01/2011   Procedure: COLONOSCOPY;  Surgeon: Danie Binder, MD;  [facility];  Service: Endoscopy;  Laterality: N/A;  12:30 PM   ESOPHAGOGASTRODUODENOSCOPY (EGD) WITH ESOPHAGEAL DILATION N/A 04/15/2013   Procedure:  ESOPHAGOGASTRODUODENOSCOPY (EGD) WITH ESOPHAGEAL DILATION;  Surgeon: Danie Binder, MD;  [facility];  Service: Endoscopy;  Laterality: N/A;  11:45-moved to 12:30 Darius Bump to notify pt   EYE SURGERY  2010   EYE SURGERY     right thumb surgery      Social History:  reports that she has never smoked. She has never used smokeless tobacco. She reports that she does not drink alcohol or use drugs.   Allergies  Allergen Reactions   Epinephrine Anaphylaxis   Demerol [Meperidine] Other (See Comments)    headaches   Vicodin [Hydrocodone-Acetaminophen] Other (See Comments)    Said it makes her crazy    Family History  Problem Relation Age of Onset   Colon cancer Father        age 74   Prostate cancer Father    Pancreatic cancer Mother        age 47     Prior to Admission medications   Medication Sig Start Date End Date Taking? Authorizing Provider  albuterol (PROVENTIL) (2.5 MG/3ML) 0.083% nebulizer solution Take 3 mLs (2.5 mg total) by nebulization every 6 (six) hours as needed for wheezing or shortness of breath. 06/27/13  Yes Baird Lyons D, MD  amLODipine (NORVASC) 5 MG tablet TAKE  1 TABLET BY MOUTH EVERY DAY 11/21/16  Yes Herminio Commons, MD  Ascorbic Acid (VITAMIN C) 1000 MG tablet Take 1,000 mg by mouth daily.   Yes [provider]  B Complex Vitamins (VITAMIN B COMPLEX) TABS Take 1 tablet by mouth daily.   Yes [provider]  Biotin 300 MCG TABS Take 1 tablet by mouth daily after supper.    Yes [provider]  buPROPion (WELLBUTRIN XL) 150 MG 24 hr tablet Take 150 mg by mouth daily. 01/15/17  Yes [provider]  CALCIUM CARBONATE PO Take 1 tablet by mouth daily. Patient states medication is calcium 1200 mg without vitamin D.   Yes [provider]  cholecalciferol (VITAMIN D) 1000 UNITS tablet Take 1,000 Units by mouth daily.   Yes [provider]  CYANOCOBALAMIN PO Take 1 tablet by mouth daily.  Patient unsure of strength   Yes [provider]  ezetimibe (ZETIA) 10 MG tablet Take 10 mg by mouth daily.   Yes [provider]  Ferrous Gluconate (IRON) 240 (27 FE) MG TABS Take 1 tablet by mouth daily.   Yes [provider]  Flaxseed, Linseed, (FLAXSEED OIL) 1000 MG CAPS Take 1 capsule by mouth.    Yes [provider]  furosemide (LASIX) 20 MG tablet Take 20 mg by mouth daily after supper.   Yes [provider]  furosemide (LASIX) 40 MG tablet TAKE 1 TABLET BY MOUTH TWICE DAILY Patient taking differently: 40 mg daily.  01/29/18  Yes Herminio Commons, MD  Lecithin 1200 MG CAPS Take 1 capsule by mouth daily.   Yes [provider]  levothyroxine (SYNTHROID, LEVOTHROID) 75 MCG tablet Take 50 mcg by mouth daily.  05/12/17  Yes [provider]  LUTEIN PO Take 1 capsule by mouth daily. Strength varies based on what is purchased at the time.   Yes [provider]  Magnesium 250 MG TABS Take 1 tablet by mouth daily.   Yes [provider]  metoprolol tartrate (LOPRESSOR) 25 MG tablet TAKE 1 TABLET BY MOUTH TWICE DAILY Patient taking differently: 12.5 mg 2 (two) times daily.  07/03/16  Yes Herminio Commons, MD  Nebulizers (COMPRESSOR/NEBULIZER) MISC Use as directed 06/27/13  Yes Young, Kasandra Knudsen, MD  Omega-3 Fatty Acids (FISH OIL) 1200 MG CAPS Take 1,200 mg by mouth daily.   Yes [provider]  pantoprazole (PROTONIX) 40 MG tablet Take 1 tablet by mouth daily as needed. 06/03/17  Yes [provider]  potassium chloride (K-DUR) 10 MEQ tablet Take 10 mEq by mouth 2 (two) times daily.   Yes [provider]  Turmeric 500 MG TABS Take by mouth daily.   Yes [provider]  warfarin (COUMADIN) 4 MG tablet Take 4 mg by mouth daily.   Yes [provider]  Zinc 50 MG TABS Take 1 tablet by mouth daily.   Yes [provider]     Objective    Physical Exam: Vitals:    08/18/18 1232 08/18/18 1330 08/18/18 1530 08/18/18 1600  BP: (!) 169/85 (!) 149/71 (!) 164/87 (!) 171/78  Pulse: 68 (!) 59 (!) 58 69  Resp: 18 18 18 16   Temp:      TempSrc:      SpO2: 100% 96% 98% 94%  Weight:      Height:        General: appears to be stated age; alert, oriented Skin: warm, dry, no rash Head:  AT/[place] Eyes:  PEARL  b/l, EOMI Mouth:  Oral mucosa membranes appear moist, normal dentition Neck: supple; trachea midline Heart:  RRR; did not appreciate any M/R/G Lungs: CTAB, did not appreciate any wheezes, rales, or rhonchi Abdomen: + BS; soft, ND, NT Extremities: no peripheral edema, no muscle wasting   Labs on Admission: I have personally reviewed following labs and imaging studies  CBC: Recent Labs  Lab 08/18/18 1205  WBC 7.0  NEUTROABS 5.2  HGB 14.6  HCT 45.5  MCV 90.1  PLT 194   Basic Metabolic Panel: Recent Labs  Lab 08/18/18 1205  NA 141  K 4.3  CL 101  CO2 30  GLUCOSE 107*  BUN 24*  CREATININE 1.12*  CALCIUM 9.4   GFR: Estimated Creatinine Clearance: 32.6 mL/min (A) (by C-G formula based on SCr of 1.12 mg/dL (H)). Liver Function Tests: Recent Labs  Lab 08/18/18 1205  AST 33  ALT 26  ALKPHOS 89  BILITOT 0.6  PROT 7.3  ALBUMIN 4.4   No results for input(s): LIPASE, AMYLASE in the last 168 hours. No results for input(s): AMMONIA in the last 168 hours. Coagulation Profile: No results for input(s): INR, PROTIME in the last 168 hours. Cardiac Enzymes: No results for input(s): CKTOTAL, CKMB, CKMBINDEX, TROPONINI in the last 168 hours. BNP (last 3 results) No results for input(s): PROBNP in the last 8760 hours. HbA1C: No results for input(s): HGBA1C in the last 72 hours. CBG: No results for input(s): GLUCAP in the last 168 hours. Lipid Profile: No results for input(s): CHOL, HDL, LDLCALC, TRIG, CHOLHDL, LDLDIRECT in the last 72 hours. Thyroid Function Tests: No results for input(s): TSH, T4TOTAL, FREET4, T3FREE, THYROIDAB in the  last 72 hours. Anemia Panel: No results for input(s): VITAMINB12, FOLATE, FERRITIN, TIBC, IRON, RETICCTPCT in the last 72 hours. Urine analysis:    Component Value Date/Time   COLORURINE STRAW (A) 08/18/2018 1139   APPEARANCEUR CLEAR 08/18/2018 1139   LABSPEC 1.005 08/18/2018 1139   PHURINE 8.0 08/18/2018 1139   GLUCOSEU NEGATIVE 08/18/2018 1139   HGBUR NEGATIVE 08/18/2018 1139   BILIRUBINUR NEGATIVE 08/18/2018 1139   KETONESUR NEGATIVE 08/18/2018 1139   PROTEINUR NEGATIVE 08/18/2018 1139   NITRITE NEGATIVE 08/18/2018 1139   LEUKOCYTESUR TRACE (A) 08/18/2018 1139    Radiological Exams on Admission: Dg Thoracic Spine W/swimmers  Result Date: 08/18/2018 CLINICAL DATA:  Thoracic spine pain since lifting water a week ago. EXAM: THORACIC SPINE - 3 VIEWS COMPARISON:  CT chest 09/20/2012 and CT abdomen and pelvis 01/15/2015. FINDINGS: S shaped thoracolumbar scoliosis is identified. Remote T11 and T12 compression fractures are noted. No acute fracture. Mild multilevel degenerative change noted. IMPRESSION: No acute finding. S shaped scoliosis. Mild, remote T11 and T12 compression fractures. Electronically Signed   By: Inge Rise M.D.   On: 08/18/2018 10:34   Dg Lumbar Spine Complete  Result Date: 08/18/2018 CLINICAL DATA:  Low back pain for 1 week radiating into both legs after lifting water. EXAM: LUMBAR SPINE - COMPLETE 4+ VIEW COMPARISON:  CT abdomen and pelvis 01/15/2015. FINDINGS: Convex left scoliosis is unchanged. The patient has remote T12 superior endplate and R74 inferior endplate compression fractures which are unchanged. Lower lumbar facet arthropathy is noted. Mild loss of disc space height is seen at L5-S1. Paraspinous structures demonstrate a stone in the lower pole of the left kidney, unchanged. Massive volume of stool throughout the colon is seen. IMPRESSION: No acute abnormality or change compared to the prior exam. Mild, remote T11 and T12 compression fractures. Lower  lumbar spondylosis.  Nonobstructing stone left kidney, unchanged. Very large volume of stool throughout the colon. Electronically Signed   By: Inge Rise M.D.   On: 08/18/2018 10:30   Ct Abdomen Pelvis W Contrast  Result Date: 08/18/2018 CLINICAL DATA:  Right greater than left lower abdominal pain. EXAM: CT ABDOMEN AND PELVIS WITH CONTRAST TECHNIQUE: Multidetector CT imaging of the abdomen and pelvis was performed using the standard protocol following bolus administration of intravenous contrast. CONTRAST:  78mL OMNIPAQUE IOHEXOL 300 MG/ML  SOLN COMPARISON:  MRI abdomen dated March 26, 2018. CT abdomen pelvis dated January 15, 2015. FINDINGS: Lower chest: No acute abnormality. Hepatobiliary: Multiple hepatic simple cysts and small hemangiomas are unchanged. Unchanged small gallbladder polyp versus gallstone. No gallbladder wall thickening. Unchanged mild common bile duct and central intrahepatic biliary dilatation. Pancreas: Unremarkable. No pancreatic ductal dilatation or surrounding inflammatory changes. Spleen: Normal in size without focal abnormality. Adrenals/Urinary Tract: The adrenal glands are unremarkable. There is a 1.2 cm exophytic, high-attenuating lesion arising from the midpole of the right kidney, previously characterized as a hemorrhagic/proteinaceous cyst on prior MRI. Subcentimeter low-density lesions in both kidneys remain too small to characterize, but are unchanged. Slight interval enlargement of the nonobstructive calculi in the lower pole of the left kidney and midpole of the right kidney. Decreased mild left hydronephrosis to the level of the UPJ. No right hydronephrosis. The bladder is unremarkable for the degree of distention. Stomach/Bowel: Stomach is within normal limits. Appendix is surgically absent. No evidence of bowel wall thickening, distention, or inflammatory changes. Moderate left-sided colonic diverticulosis. Large colonic stool burden. Vascular/Lymphatic: Aortic  atherosclerosis. No enlarged abdominal or pelvic lymph nodes. Reproductive: Status post hysterectomy. No adnexal masses. Other: No abdominal wall hernia or abnormality. No abdominopelvic ascites. No pneumoperitoneum. Musculoskeletal: No acute or significant osseous findings. Slight interval progression of the now moderate T12 compression deformity since November 2019. IMPRESSION: 1.  No acute intra-abdominal process. 2. Prominent stool throughout the colon. Correlate for constipation. 3. Slight interval increase in size of bilateral nonobstructive nephrolithiasis. 4. Slight interval progression of the now moderate T12 compression deformity since November 2019. Electronically Signed   By: Titus Dubin M.D.   On: 08/18/2018 15:22     Assessment/Plan   [name] is a 83 y.o. female with medical history significant for multiple acute ischemic CVAs prompting chronic anticoagulation on warfarin, chronic abdominal pain, acquired hypothyroidism, hypertension, chronic diastolic heart failure, who is admitted to [facility] on 08/18/2018 for pain control in setting of progression of T12 compression fracture at presenting from home to [facility] ED complaining of low back pain.    Principal Problem:   T12 compression fracture (HCC) Active Problems:   Chronic anticoagulation   Low back pain   Constipation   Chronic diastolic CHF (congestive heart failure) (HCC)   Acquired hypothyroidism   #) Low Back Pain due to progression of T12 compression fracture: The patient presents with 1 week of progressive low back pain, with exacerbation of discomfort starting following patients attempt to lift a case of water, as above, with CT imaging today correlating with interval progression of T12 compression fracture relative to MRI from November 2019.  While the patient possesses no overt red flag symptoms at this time, including no evidence of urinary retention, fecal incontinence, saddle anesthesia,  or acute focal weakness, the intensity of her low back pain has progressed to the point where she is finding it difficult to perform her own ADLs.  This is of concern given that  the patient lives alone, independently performing her ADLs, without any local family that can provide temporary assistance to her. Therefore, the patient was admitted for pain control as well as further evaluation of worsening T12 compression fracture. ED provider discussed the patient's case and imaging with the on-call interventional radiologist, Dr. Hyacinth Meeker, who recommended obtaining MRI of thoracic spine to further evaluate the patient's candidacy for kyphoplasty.  The patient would prefer to avoid opioid analgesia given prior experience with incapacitating somnolence associated with this class of medications. Of note, she has not yet tried the NSAIDs class as pain control intervention.  There does appear to be radicular element, with radiation of patient's back discomfort into the bilateral groin.  Plan: MRI of the thoracic spine, per recommendations of IR, as above. Toradol 15 mg IV x 1 now. If beneficial, could order this on a prn basis. Will start ibuprofen 400 to 600 mg p.o. every 6 hours as needed for low back pain. I have also placed consults with physical therapy and Occupational Therapy, starting tomorrow morning (08/19/18).  PRN acetaminophen.  Monitor strict I's and O's.    #) Constipation: The patient reports a decrease in the frequency of her bowel movements over the last few days leading up to today's presentation, likely on the basis of diminished ambulatory status stemming from suboptimal pain control relating to progression of her T12 compression fracture, as further described above.  CT abdomen/pelvis performed in the ED today demonstrated evidence of prominent stool in the absence of any radiographic evidence to suggest bowel obstruction.  Optimal definitive management of such includes improvement in pain control of  low back discomfort to promote increase in ambulatory status.  Plan: I have ordered scheduled Colace along with as needed MiraLAX.  Work-up and management of low back pain in the setting of progression of T12 compression fracture, is further described above.     #) History of multiple prior acute ischemic CVAs: Resulting in initiation of chronic anticoagulation on warfarin a few years ago.  No evidence of acute focal neurologic deficits at this time.  Plan: Continue home warfarin.  I have placed an inpatient pharmacy consult for assistance with warfarin dosing during this hospitalization.     #) Acquired hypothyroidism: On Synthroid as an outpatient.  Plan: Continue home Synthroid.     #) Essential hypertension: On amlodipine as well as Lopressor as an outpatient.  Vital signs in the ED today revealed multiple blood pressures that exceed JNC 8 antihypertensive guidelines, however, I believe that a component of this is likely secondary to suboptimal pain control at this moment, as above.  Plan: Continue home amlodipine and Lopressor.  Work-up and management of low back pain in the setting of T12 compression fracture, is further described above.     #) Chronic diastolic heart failure: Most recent echocardiogram occurred in January 2019 and showed mild LVH, EF 60 to 65%, no focal wall motion abnormalities, but was not technically sufficient to evaluate diastolic function, although most recent echocardiogram performed prior to that, which occurred in April 2015, showed evidence of grade 2 diastolic dysfunction.  She is on Lasix 4 mg p.o. daily at home.  No evidence of acutely decompensated heart failure at this time.  Plan: Monitor strict I's and O's.  Continue home Lasix.  Repeat BMP in the morning.     #) Depression: On Wellbutrin as an outpatient.  Plan: Continue home Wellbutrin.     DVT prophylaxis: on chronic anticoagulation via Warfarin.  Code Status:  full Family  Communication: (none) Disposition Plan: Per Rounding Team Consults called: Dr. Hyacinth Meeker of interventional radiology, as above. Admission status: observation; med-surg    PLEASE NOTE THAT DRAGON DICTATION SOFTWARE WAS USED IN THE CONSTRUCTION OF THIS NOTE.   [facility] Pager [phone] From 3PM- 11PM.   Otherwise, please contact night-coverage  [email or web address] Password [facility]  08/18/2018, 4:26 PM

## 2018-08-19 ENCOUNTER — Observation Stay (HOSPITAL_COMMUNITY): Payer: PPO

## 2018-08-19 DIAGNOSIS — Z885 Allergy status to narcotic agent status: Secondary | ICD-10-CM | POA: Diagnosis not present

## 2018-08-19 DIAGNOSIS — Z79899 Other long term (current) drug therapy: Secondary | ICD-10-CM | POA: Diagnosis not present

## 2018-08-19 DIAGNOSIS — M545 Low back pain, unspecified: Secondary | ICD-10-CM | POA: Diagnosis present

## 2018-08-19 DIAGNOSIS — M4854XA Collapsed vertebra, not elsewhere classified, thoracic region, initial encounter for fracture: Secondary | ICD-10-CM | POA: Diagnosis present

## 2018-08-19 DIAGNOSIS — S22080A Wedge compression fracture of T11-T12 vertebra, initial encounter for closed fracture: Secondary | ICD-10-CM | POA: Diagnosis not present

## 2018-08-19 DIAGNOSIS — M5136 Other intervertebral disc degeneration, lumbar region: Secondary | ICD-10-CM | POA: Diagnosis not present

## 2018-08-19 DIAGNOSIS — I10 Essential (primary) hypertension: Secondary | ICD-10-CM | POA: Diagnosis not present

## 2018-08-19 DIAGNOSIS — S22080G Wedge compression fracture of T11-T12 vertebra, subsequent encounter for fracture with delayed healing: Secondary | ICD-10-CM | POA: Diagnosis not present

## 2018-08-19 DIAGNOSIS — R52 Pain, unspecified: Secondary | ICD-10-CM

## 2018-08-19 DIAGNOSIS — K5900 Constipation, unspecified: Secondary | ICD-10-CM | POA: Diagnosis present

## 2018-08-19 DIAGNOSIS — I5032 Chronic diastolic (congestive) heart failure: Secondary | ICD-10-CM | POA: Diagnosis present

## 2018-08-19 DIAGNOSIS — Z8042 Family history of malignant neoplasm of prostate: Secondary | ICD-10-CM | POA: Diagnosis not present

## 2018-08-19 DIAGNOSIS — E039 Hypothyroidism, unspecified: Secondary | ICD-10-CM | POA: Diagnosis present

## 2018-08-19 DIAGNOSIS — K5909 Other constipation: Secondary | ICD-10-CM

## 2018-08-19 DIAGNOSIS — F329 Major depressive disorder, single episode, unspecified: Secondary | ICD-10-CM | POA: Diagnosis present

## 2018-08-19 DIAGNOSIS — F419 Anxiety disorder, unspecified: Secondary | ICD-10-CM | POA: Diagnosis present

## 2018-08-19 DIAGNOSIS — M549 Dorsalgia, unspecified: Secondary | ICD-10-CM | POA: Diagnosis present

## 2018-08-19 DIAGNOSIS — I69341 Monoplegia of lower limb following cerebral infarction affecting right dominant side: Secondary | ICD-10-CM | POA: Diagnosis not present

## 2018-08-19 DIAGNOSIS — Z79890 Hormone replacement therapy (postmenopausal): Secondary | ICD-10-CM | POA: Diagnosis not present

## 2018-08-19 DIAGNOSIS — Z7901 Long term (current) use of anticoagulants: Secondary | ICD-10-CM | POA: Diagnosis not present

## 2018-08-19 DIAGNOSIS — I130 Hypertensive heart and chronic kidney disease with heart failure and stage 1 through stage 4 chronic kidney disease, or unspecified chronic kidney disease: Secondary | ICD-10-CM | POA: Diagnosis present

## 2018-08-19 DIAGNOSIS — Z800 Family history of malignant neoplasm of digestive organs: Secondary | ICD-10-CM | POA: Diagnosis not present

## 2018-08-19 DIAGNOSIS — K219 Gastro-esophageal reflux disease without esophagitis: Secondary | ICD-10-CM | POA: Diagnosis present

## 2018-08-19 LAB — BASIC METABOLIC PANEL
Anion gap: 11 (ref 5–15)
BUN: 20 mg/dL (ref 8–23)
CO2: 30 mmol/L (ref 22–32)
Calcium: 9.1 mg/dL (ref 8.9–10.3)
Chloride: 102 mmol/L (ref 98–111)
Creatinine, Ser: 1.16 mg/dL — ABNORMAL HIGH (ref 0.44–1.00)
GFR calc Af Amer: 51 mL/min — ABNORMAL LOW (ref >60)
GFR calc non Af Amer: 44 mL/min — ABNORMAL LOW (ref >60)
Glucose, Bld: 85 mg/dL (ref 70–99)
Potassium: 4.2 mmol/L (ref 3.5–5.1)
Sodium: 143 mmol/L (ref 135–145)

## 2018-08-19 LAB — CBC
HCT: 43.8 % (ref 36.0–46.0)
Hemoglobin: 13.8 g/dL (ref 12.0–15.0)
MCH: 28.6 pg (ref 26.0–34.0)
MCHC: 31.5 g/dL (ref 30.0–36.0)
MCV: 90.9 fL (ref 80.0–100.0)
Platelets: 285 10*3/uL (ref 150–400)
RBC: 4.82 MIL/uL (ref 3.87–5.11)
RDW: 13.7 % (ref 11.5–15.5)
WBC: 6.1 10*3/uL (ref 4.0–10.5)
nRBC: 0 % (ref 0.0–0.2)

## 2018-08-19 LAB — PROTIME-INR
INR: 2.9 — ABNORMAL HIGH (ref 0.8–1.2)
Prothrombin Time: 29.5 seconds — ABNORMAL HIGH (ref 11.4–15.2)

## 2018-08-19 LAB — MAGNESIUM: Magnesium: 2.7 mg/dL — ABNORMAL HIGH (ref 1.7–2.4)

## 2018-08-19 MED ORDER — TRAMADOL HCL 50 MG PO TABS
50.0000 mg | ORAL_TABLET | Freq: Two times a day (BID) | ORAL | Status: DC
  Administered 2018-08-19 – 2018-08-24 (×11): 50 mg via ORAL
  Filled 2018-08-19 (×11): qty 1

## 2018-08-19 MED ORDER — WARFARIN - PHARMACIST DOSING INPATIENT
Freq: Every day | Status: DC
  Administered 2018-08-19 – 2018-08-24 (×2)

## 2018-08-19 MED ORDER — METHOCARBAMOL 500 MG PO TABS
500.0000 mg | ORAL_TABLET | Freq: Three times a day (TID) | ORAL | Status: DC
  Administered 2018-08-19 – 2018-08-24 (×17): 500 mg via ORAL
  Filled 2018-08-19 (×17): qty 1

## 2018-08-19 MED ORDER — WARFARIN SODIUM 2 MG PO TABS: 2.0000 mg | ORAL_TABLET | Freq: Once | ORAL | Status: DC

## 2018-08-19 NOTE — TOC Initial Note (Signed)
Transition of Care [facility]) - Initial/Assessment Note    Patient Details  Name: [name] MRN: [id number] Date of Birth: November 16, 1935  Transition of Care [facility]) CM/SW Contact:    Sherald Barge, RN Phone Number: 08/19/2018, 12:06 PM  Clinical Narrative:                   Expected Discharge Plan: [place]     Patient Goals and CMS Choice Patient states their goals for this hospitalization and ongoing recovery are:: go back to volunteering CMS Medicare.gov Compare Post Acute Care list provided to:: Patient Choice offered to / list presented to : Patient  Expected Discharge Plan and Services Expected Discharge Plan: [place] Choice: [place] Living arrangements for the past 2 months: Apartment                          Prior Living Arrangements/Services Living arrangements for the past 2 months: Apartment Lives with:: Self Patient language and need for interpreter reviewed:: Yes Do you feel safe going back to the place where you live?: Yes(after rehab)      Need for Family Participation in Patient Care: No (Comment) Care giver support system in place?: Yes (comment)   Criminal Activity/Legal Involvement Pertinent to Current Situation/Hospitalization: No - Comment as needed  Activities of Daily Living Home Assistive Devices/Equipment: Eyeglasses ADL Screening (condition at time of admission) Patient's cognitive ability adequate to safely complete daily activities?: Yes Is the patient deaf or have difficulty hearing?: No Does the patient have difficulty seeing, even when wearing glasses/contacts?: No Does the patient have difficulty concentrating, remembering, or making decisions?: No Patient able to express need for assistance with ADLs?: Yes Does the patient have difficulty dressing or bathing?: No Independently performs ADLs?: No Communication: Independent Dressing (OT): Needs  assistance Is this a change from baseline?: Change from baseline, expected to last <3days Grooming: Independent Feeding: Independent Bathing: Needs assistance Is this a change from baseline?: Change from baseline, expected to last <3 days Toileting: Needs assistance Is this a change from baseline?: Change from baseline, expected to last <3 days In/Out Bed: Needs assistance Is this a change from baseline?: Change from baseline, expected to last <3 days Walks in Home: Independent Does the patient have difficulty walking or climbing stairs?: Yes Weakness of Legs: None Weakness of Arms/Hands: None  Permission Sought/Granted Permission sought to share information with : Chartered certified accountant granted to share information with : Yes, Verbal Permission Granted     Permission granted to share info w AGENCY: [facility]        Emotional Assessment Appearance:: Appears younger than stated age Attitude/Demeanor/Rapport: Engaged Affect (typically observed): Appropriate, Pleasant Orientation: : Oriented to Self, Oriented to Place, Oriented to  Time, Oriented to Situation Alcohol / Substance Use: Not Applicable    Admission diagnosis:  Back pain [M54.9] Compression fracture of T12 vertebra, initial encounter ([place]) [S22.080A] Patient Active Problem List   Diagnosis Date Noted  . Low back pain 08/19/2018  . Constipation 08/19/2018  . Chronic diastolic CHF (congestive heart failure) ([place]) 08/19/2018  . Acquired hypothyroidism 08/19/2018  . Intractable pain 08/19/2018  . Essential hypertension 08/19/2018  . T12 compression fracture ([place]) 08/18/2018  . Breast cancer ([place]) 06/23/2016  . Change in bowel habits 01/11/2015  . Loss of weight 01/11/2015  . Abdominal pain, chronic, right upper quadrant 01/11/2015  . Unspecified chronic bronchitis (  [place]) 09/25/2013  . Other dysphagia 04/01/2013  . Cough 01/20/2013  . Thrush 10/28/2012  . Chest pain 09/26/2012  . H/O  adenomatous polyp of colon 11/18/2011  . Chronic anticoagulation 11/18/2011   PCP:  Celene Squibb, MD Pharmacy:   [facility], [place] AT [place] [place] [address] Phone: [phone] Fax: [phone]     Social Determinants of Health (SDOH) Interventions    Readmission Risk Interventions No flowsheet data found.

## 2018-08-19 NOTE — Plan of Care (Signed)
  Problem: Acute Rehab PT Goals(only PT should resolve) Goal: Pt Will Go Supine/Side To Sit Outcome: Progressing Flowsheets (Taken 08/19/2018 1024) Pt will go Supine/Side to Sit: with supervision Goal: Patient Will Transfer Sit To/From Stand Outcome: Progressing Flowsheets (Taken 08/19/2018 1024) Patient will transfer sit to/from stand: with min guard assist Goal: Pt Will Transfer Bed To Chair/Chair To Bed Outcome: Progressing Flowsheets (Taken 08/19/2018 1024) Pt will Transfer Bed to Chair/Chair to Bed: min guard assist Goal: Pt Will Ambulate Outcome: Progressing Flowsheets (Taken 08/19/2018 1024) Pt will Ambulate: 75 feet; with cane; with min guard assist   10:26 AM, 08/19/18 Lonell Grandchild, MPT Physical Therapist with [facility] [phone] office [phone] mobile phone

## 2018-08-19 NOTE — Care Management Obs Status (Signed)
[place] NOTIFICATION   Patient Details  Name: KITA NEACE MRN: [id number] Date of Birth: Apr 05, 1936   Medicare Observation Status Notification Given:  Yes    Sherald Barge, RN 08/19/2018, 12:03 PM

## 2018-08-19 NOTE — Progress Notes (Signed)
PROGRESS NOTE  [name] EXB:[id number] DOB: 1935/06/28 DOA: 08/18/2018 PCP: Celene Squibb, MD  Brief History:  83 year old female with a history of ischemic CVAs in the past resulting in warfarin anticoagulation, chronic abdominal pain, hypertension, hypothyroidism presenting with worsening back pain over 1 week.  The patient states that she was at the grocery store getting a pack of bottled water.  After lifting the water and transferring her to her grocery cart, the patient had immediate increase in her lower back pain.  Fortunately, the patient was able to subsequently drive home, and she even transferred the package of water onto a dolly and back into her house.  Over the next 1 to 2 days, the patient related increasing lower back pain.  She stated that in the past week she has visited her primary care provider twice.  She stated that no radiographs were performed, but she was started on some type of muscle relaxer.  She thinks that it may have been cyclobenzaprine.  She stated that it did not help much.  Because of increasing pain, this has limited the patient's ability to perform her usual ADLs.  Fortunately, the patient is still able to make transfers and get to the bathroom albeit with significant pain and taking a significant amount of time.  She denies any recent additional falls or trauma.  She denies any saddle anesthesia, focal unilateral leg or arm weakness, bowel or bladder incontinence.  She denies any fevers or chills.  The patient states that she is only been using acetaminophen around-the-clock.  She does not want to use any " strong pain medicine" that would cause her to have somnolence.  She also complains of constipation with her last bowel movement on 08/17/2018.  Normally she has a bowel movement almost daily.  Because of worsening pain, the patient presented to emergency department for further evaluation.  CT of the abdomen and pelvis showed slight interval  progression of her T12 compression deformity since 03/2018.  The case was discussed with IR whom recommended MRI of the thoracic spine to see if the patient may be a candidate for kyphoplasty.  Assessment/Plan: Intractable back pain/T12 compression fracture -The patient wants to avoid " strong pain medicine" -Start tramadol -Start Robaxin -Acetaminophen around-the-clock -PT evaluation -MRI thoracic spine  Constipation/chronic abdominal pain -08/18/2018 CT abdomen pelvis--unchanged hepatic cyst, unchanged GB polyp versus stone with unchanged mild CBD and central intrahepatic biliary ductal dilatation; prominent colonic stool; decreased mild left hydronephrosis to the level of UPJ -Start MiraLAX daily -Start Senokot  History Ischemic CVAs -holding warfarin for now in anticipation should pt require intervention for T12 compression fracture  Essential hypertension -BP elevated in part due to pain -Continue amlodipine and metoprolol tartrate  Hypothyroidism -Continue Synthroid  CKD stage III -Baseline creatinine 0.9-1.1  Depression/anxiety -Continue Wellbutrin     Disposition Plan:   Home vs SNF  in 1-2 days  Family Communication:   Family at bedside  Consultants:  none Code Status:  FULL  DVT Prophylaxis: warfarin   Procedures: As Listed in Progress Note Above  Antibiotics: None   Total time spent 35 minutes.  Greater than 50% spent face to face counseling and coordinating care.     Subjective: Pt states her abd pain is about same--severe with any type of movement from midback to buttock.  She denies f/c, cp, sob, n/v/d, hematochezia, melena.  She denies focal extremity weakness, bowel or bladder incontinence or saddle  anesthesia.  Objective: Vitals:   08/18/18 1915 08/18/18 1944 08/18/18 2102 08/19/18 0459  BP: (!) 144/98  (!) 151/87 132/63  Pulse: 69  65 (!) 50  Resp: 20  20 16   Temp:  98.1 F (36.7 C) 98 F (36.7 C) 97.9 F (36.6 C)  TempSrc:  Oral  Oral Oral  SpO2: 99%  100% 99%  Weight: 59.7 kg   64.5 kg  Height: 5\' 2"  (1.575 m)       Intake/Output Summary (Last 24 hours) at 08/19/2018 0716 Last data filed at 08/18/2018 2326 Gross per 24 hour  Intake 240 ml  Output 400 ml  Net -160 ml   Weight change:  Exam:   General:  Pt is alert, follows commands appropriately, not in acute distress  HEENT: No icterus, No thrush, No neck mass, [place]/AT  Cardiovascular: RRR, S1/S2, no rubs, no gallops  Respiratory: CTA bilaterally, no wheezing, no crackles, no rhonchi  Abdomen: Soft/+BS, non tender, non distended, no guarding  Extremities: trace LE edema, No lymphangitis, No petechiae, No rashes, no synovitis  Neuro:  CN II-XII intact, strength 4/5 in RUE, RLE, strength 4/5 LUE, LLE; sensation intact bilateral; no dysmetria; babinski equivocal     Data Reviewed: I have personally reviewed following labs and imaging studies Basic Metabolic Panel: Recent Labs  Lab 08/18/18 1205 08/19/18 0457  NA 141 143  K 4.3 4.2  CL 101 102  CO2 30 30  GLUCOSE 107* 85  BUN 24* 20  CREATININE 1.12* 1.16*  CALCIUM 9.4 9.1  MG  --  2.7*   Liver Function Tests: Recent Labs  Lab 08/18/18 1205  AST 33  ALT 26  ALKPHOS 89  BILITOT 0.6  PROT 7.3  ALBUMIN 4.4   No results for input(s): LIPASE, AMYLASE in the last 168 hours. No results for input(s): AMMONIA in the last 168 hours. Coagulation Profile: Recent Labs  Lab 08/18/18 1758  INR 2.9*   CBC: Recent Labs  Lab 08/18/18 1205 08/19/18 0457  WBC 7.0 6.1  NEUTROABS 5.2  --   HGB 14.6 13.8  HCT 45.5 43.8  MCV 90.1 90.9  PLT 300 285   Cardiac Enzymes: No results for input(s): CKTOTAL, CKMB, CKMBINDEX, TROPONINI in the last 168 hours. BNP: Invalid input(s): POCBNP CBG: No results for input(s): GLUCAP in the last 168 hours. HbA1C: No results for input(s): HGBA1C in the last 72 hours. Urine analysis:    Component Value Date/Time   COLORURINE STRAW (A) 08/18/2018 1139     APPEARANCEUR CLEAR 08/18/2018 1139   LABSPEC 1.005 08/18/2018 1139   PHURINE 8.0 08/18/2018 1139   GLUCOSEU NEGATIVE 08/18/2018 1139   HGBUR NEGATIVE 08/18/2018 1139   BILIRUBINUR NEGATIVE 08/18/2018 1139   KETONESUR NEGATIVE 08/18/2018 1139   PROTEINUR NEGATIVE 08/18/2018 1139   NITRITE NEGATIVE 08/18/2018 1139   LEUKOCYTESUR TRACE (A) 08/18/2018 1139   Sepsis Labs: @LABRCNTIP (procalcitonin:4,lacticidven:4) )No results found for this or any previous visit (from the past 240 hour(s)).   Scheduled Meds:  amLODipine  5 mg Oral Daily   buPROPion  150 mg Oral Daily   cholecalciferol  1,000 Units Oral Daily   docusate sodium  100 mg Oral BID   ezetimibe  10 mg Oral Daily   ferrous sulfate  325 mg Oral Daily   furosemide  40 mg Oral Daily   ketorolac  15 mg Intravenous Once   levothyroxine  50 mcg Oral Q0600   metoprolol tartrate  12.5 mg Oral BID   vitamin C  1,000 mg Oral Daily   Continuous Infusions:  Procedures/Studies: Dg Thoracic Spine W/swimmers  Result Date: 08/18/2018 CLINICAL DATA:  Thoracic spine pain since lifting water a week ago. EXAM: THORACIC SPINE - 3 VIEWS COMPARISON:  CT chest 09/20/2012 and CT abdomen and pelvis 01/15/2015. FINDINGS: S shaped thoracolumbar scoliosis is identified. Remote T11 and T12 compression fractures are noted. No acute fracture. Mild multilevel degenerative change noted. IMPRESSION: No acute finding. S shaped scoliosis. Mild, remote T11 and T12 compression fractures. Electronically Signed   By: Inge Rise M.D.   On: 08/18/2018 10:34   Dg Lumbar Spine Complete  Result Date: 08/18/2018 CLINICAL DATA:  Low back pain for 1 week radiating into both legs after lifting water. EXAM: LUMBAR SPINE - COMPLETE 4+ VIEW COMPARISON:  CT abdomen and pelvis 01/15/2015. FINDINGS: Convex left scoliosis is unchanged. The patient has remote T12 superior endplate and K24 inferior endplate compression fractures which are unchanged. Lower lumbar  facet arthropathy is noted. Mild loss of disc space height is seen at L5-S1. Paraspinous structures demonstrate a stone in the lower pole of the left kidney, unchanged. Massive volume of stool throughout the colon is seen. IMPRESSION: No acute abnormality or change compared to the prior exam. Mild, remote T11 and T12 compression fractures. Lower lumbar spondylosis. Nonobstructing stone left kidney, unchanged. Very large volume of stool throughout the colon. Electronically Signed   By: Inge Rise M.D.   On: 08/18/2018 10:30   Ct Abdomen Pelvis W Contrast  Result Date: 08/18/2018 CLINICAL DATA:  Right greater than left lower abdominal pain. EXAM: CT ABDOMEN AND PELVIS WITH CONTRAST TECHNIQUE: Multidetector CT imaging of the abdomen and pelvis was performed using the standard protocol following bolus administration of intravenous contrast. CONTRAST:  80mL OMNIPAQUE IOHEXOL 300 MG/ML  SOLN COMPARISON:  MRI abdomen dated March 26, 2018. CT abdomen pelvis dated January 15, 2015. FINDINGS: Lower chest: No acute abnormality. Hepatobiliary: Multiple hepatic simple cysts and small hemangiomas are unchanged. Unchanged small gallbladder polyp versus gallstone. No gallbladder wall thickening. Unchanged mild common bile duct and central intrahepatic biliary dilatation. Pancreas: Unremarkable. No pancreatic ductal dilatation or surrounding inflammatory changes. Spleen: Normal in size without focal abnormality. Adrenals/Urinary Tract: The adrenal glands are unremarkable. There is a 1.2 cm exophytic, high-attenuating lesion arising from the midpole of the right kidney, previously characterized as a hemorrhagic/proteinaceous cyst on prior MRI. Subcentimeter low-density lesions in both kidneys remain too small to characterize, but are unchanged. Slight interval enlargement of the nonobstructive calculi in the lower pole of the left kidney and midpole of the right kidney. Decreased mild left hydronephrosis to the level of  the UPJ. No right hydronephrosis. The bladder is unremarkable for the degree of distention. Stomach/Bowel: Stomach is within normal limits. Appendix is surgically absent. No evidence of bowel wall thickening, distention, or inflammatory changes. Moderate left-sided colonic diverticulosis. Large colonic stool burden. Vascular/Lymphatic: Aortic atherosclerosis. No enlarged abdominal or pelvic lymph nodes. Reproductive: Status post hysterectomy. No adnexal masses. Other: No abdominal wall hernia or abnormality. No abdominopelvic ascites. No pneumoperitoneum. Musculoskeletal: No acute or significant osseous findings. Slight interval progression of the now moderate T12 compression deformity since November 2019. IMPRESSION: 1.  No acute intra-abdominal process. 2. Prominent stool throughout the colon. Correlate for constipation. 3. Slight interval increase in size of bilateral nonobstructive nephrolithiasis. 4. Slight interval progression of the now moderate T12 compression deformity since November 2019. Electronically Signed   By: Titus Dubin M.D.   On: 08/18/2018 15:22    Orson Eva,  DO  [facility] Pager [phone]  If 7PM-7AM, please contact night-coverage [email or web address] Password [email or web address] 08/19/2018, 7:16 AM   LOS: 0 days

## 2018-08-19 NOTE — Progress Notes (Signed)
ANTICOAGULATION CONSULT NOTE - Follow Up Consult  Pharmacy Consult for warfarin Indication: multiple ischemic CVAs   Allergies  Allergen Reactions  . Epinephrine Anaphylaxis  . Demerol [Meperidine] Other (See Comments)    headaches  . Vicodin [Hydrocodone-Acetaminophen] Other (See Comments)    [name] it makes her crazy. Pt tolerates acetaminophen    Patient Measurements: Height: 5\' 2"  (157.5 cm) Weight: 142 lb 3.2 oz (64.5 kg) IBW/kg (Calculated) : 50.1   Vital Signs: Temp: 97.9 F (36.6 C) (03/26 0459) Temp Source: Oral (03/26 0459) BP: 132/63 (03/26 0459) Pulse Rate: 50 (03/26 0459)  Labs: Recent Labs    08/18/18 1205 08/18/18 1758 08/19/18 0457 08/19/18 0845  HGB 14.6  --  13.8  --   HCT 45.5  --  43.8  --   PLT 300  --  285  --   LABPROT  --  30.0*  --  29.5*  INR  --  2.9*  --  2.9*  CREATININE 1.12*  --  1.16*  --     Estimated Creatinine Clearance: 33.6 mL/min (A) (by C-G formula based on SCr of 1.16 mg/dL (H)).   Medications:  Medications Prior to Admission  Medication Sig Dispense Refill Last Dose  . albuterol (PROVENTIL) (2.5 MG/3ML) 0.083% nebulizer solution Take 3 mLs (2.5 mg total) by nebulization every 6 (six) hours as needed for wheezing or shortness of breath. 75 mL 12 Past Week at Unknown time  . amLODipine (NORVASC) 5 MG tablet TAKE 1 TABLET BY MOUTH EVERY DAY 30 tablet 6 08/18/2018 at Unknown time  . Ascorbic Acid (VITAMIN C) 1000 MG tablet Take 1,000 mg by mouth daily.   08/18/2018 at Unknown time  . B Complex Vitamins (VITAMIN B COMPLEX) TABS Take 1 tablet by mouth daily.   08/18/2018 at Unknown time  . Biotin 300 MCG TABS Take 1 tablet by mouth daily after supper.    08/17/2018 at Unknown time  . buPROPion (WELLBUTRIN XL) 150 MG 24 hr tablet Take 150 mg by mouth daily.   08/18/2018 at Unknown time  . CALCIUM CARBONATE PO Take 1 tablet by mouth daily. Patient states medication is calcium 1200 mg without vitamin D.   08/18/2018 at Unknown time  .  cholecalciferol (VITAMIN D) 1000 UNITS tablet Take 1,000 Units by mouth daily.   08/18/2018 at Unknown time  . CYANOCOBALAMIN PO Take 1 tablet by mouth daily. Patient unsure of strength   08/18/2018 at Unknown time  . ezetimibe (ZETIA) 10 MG tablet Take 10 mg by mouth daily.   08/18/2018 at Unknown time  . Ferrous Gluconate (IRON) 240 (27 FE) MG TABS Take 1 tablet by mouth daily.   08/17/2018 at Unknown time  . Flaxseed, Linseed, (FLAXSEED OIL) 1000 MG CAPS Take 1 capsule by mouth.    08/17/2018 at Unknown time  . furosemide (LASIX) 20 MG tablet Take 20 mg by mouth daily after supper.   08/17/2018 at Unknown time  . furosemide (LASIX) 40 MG tablet TAKE 1 TABLET BY MOUTH TWICE DAILY (Patient taking differently: 40 mg daily. ) 180 tablet 3 08/18/2018 at Unknown time  . Lecithin 1200 MG CAPS Take 1 capsule by mouth daily.   08/17/2018 at Unknown time  . levothyroxine (SYNTHROID, LEVOTHROID) 75 MCG tablet Take 50 mcg by mouth daily.   4 08/18/2018 at Unknown time  . LUTEIN PO Take 1 capsule by mouth daily. Strength varies based on what is purchased at the time.   08/17/2018 at Unknown time  . Magnesium  250 MG TABS Take 1 tablet by mouth daily.   08/18/2018 at Unknown time  . metoprolol tartrate (LOPRESSOR) 25 MG tablet TAKE 1 TABLET BY MOUTH TWICE DAILY (Patient taking differently: 12.5 mg 2 (two) times daily. ) 60 tablet 3 08/18/2018 at Unknown time  . Nebulizers (COMPRESSOR/NEBULIZER) MISC Use as directed 1 each 0 Past Week at Unknown time  . Omega-3 Fatty Acids (FISH OIL) 1200 MG CAPS Take 1,200 mg by mouth daily.   08/17/2018 at Unknown time  . pantoprazole (PROTONIX) 40 MG tablet Take 1 tablet by mouth daily as needed.   08/18/2018 at Unknown time  . potassium chloride (K-DUR) 10 MEQ tablet Take 10 mEq by mouth 2 (two) times daily.   08/18/2018 at Unknown time  . Turmeric 500 MG TABS Take by mouth daily.   08/17/2018 at Unknown time  . warfarin (COUMADIN) 4 MG tablet Take 4 mg by mouth daily.   08/17/2018 at  Unknown time  . Zinc 50 MG TABS Take 1 tablet by mouth daily.   08/18/2018 at Unknown time   Scheduled:  . amLODipine  5 mg Oral Daily  . buPROPion  150 mg Oral Daily  . cholecalciferol  1,000 Units Oral Daily  . docusate sodium  100 mg Oral BID  . ezetimibe  10 mg Oral Daily  . ferrous sulfate  325 mg Oral Daily  . furosemide  40 mg Oral Daily  . ketorolac  15 mg Intravenous Once  . levothyroxine  50 mcg Oral Q0600  . methocarbamol  500 mg Oral TID  . metoprolol tartrate  12.5 mg Oral BID  . traMADol  50 mg Oral Q12H  . vitamin C  1,000 mg Oral Daily   Infusions:   PRN: acetaminophen **OR** acetaminophen, albuterol, ibuprofen, morphine injection, ondansetron **OR** ondansetron (ZOFRAN) IV, polyethylene glycol Anti-infectives (From admission, onward)   None      Assessment: 83 year old female with history of ischemic CVAs, takes warfarin 4mg  po daily, admitted with INR of 2.9 3/25, INR 2/26 2.9. INR is therapeutic, INR goal 2-3.  Goal of Therapy:  INR 2-3 Monitor platelets by anticoagulation protocol: Yes   Plan:  Warfarin 2mg  x 1 dose tonight, Will re-evaluate tomorrow. CBC/INR daily, will monitor for signs and symptoms of bleeding.    Christophere Hillhouse 08/19/2018,9:20 AM

## 2018-08-19 NOTE — Progress Notes (Signed)
Pharmacy notified of Coumadin consult placed.

## 2018-08-19 NOTE — Evaluation (Signed)
Physical Therapy Evaluation Patient Details Name: [name] MRN: [id number] DOB: September 14, 1935 Today's Date: 08/19/2018   History of Present Illness  [name] is a 83 y.o. female with medical history significant for multiple acute ischemic CVAs prompting chronic anticoagulation on warfarin, chronic abdominal pain, acquired hypothyroidism, hypertension, chronic diastolic heart failure, who is admitted to [facility] on 08/18/2018 for pain control in setting of progression of T12 compression fracture at presenting from home to [facility] ED complaining of low back pain.     Clinical Impression  Patient has much difficulty with bed mobility requiring assistance to move legs due to sharp low back pain and spasms, has to lean on nearby objects when taking steps and frequently guards right side of low back by holding with right hand increasing fall risk, required use of cane for safety, but frequently had to stop with near loss of balance when low pain/spasms increase.  Patient put on gurney and taken for a procedure after therapy.  Patient will benefit from continued physical therapy in hospital and recommended venue below to increase strength, balance, endurance for safe ADLs and gait.    Follow Up Recommendations SNF;Supervision - Intermittent    Equipment Recommendations  Rolling walker with 5" wheels;Other (comment)(TSLO brace to stabilize back)    Recommendations for Other Services       Precautions / Restrictions Precautions Precautions: Back;Fall Precaution Comments: T12 compression fx Restrictions Weight Bearing Restrictions: No      Mobility  Bed Mobility Overal bed mobility: Needs Assistance Bed Mobility: Rolling;Sidelying to Sit;Sit to Sidelying Rolling: Supervision Sidelying to sit: Min assist     Sit to sidelying: Min assist General bed mobility comments: increased time and requires assistance to move legs due to severe incease in low back pain  with spasming  Transfers Overall transfer level: Needs assistance Equipment used: Straight cane;None Transfers: Sit to/from [employer] Transfers Sit to Stand: Min guard Stand pivot transfers: Supervision       General transfer comment: slow labored movement having to lean on nearby objects for support, safer using SPC  Ambulation/Gait Ambulation/Gait assistance: Min assist Gait Distance (Feet): 50 Feet Assistive device: Straight cane Gait Pattern/deviations: Decreased step length - right;Decreased step length - left;Decreased stride length Gait velocity: decreased   General Gait Details: slow labored cadence with frequent stopping due to increased pain/spasming in low back, slightly decreased proprioception of RLE possibly due to old CVA per patient  Stairs            Wheelchair Mobility    Modified Rankin (Stroke Patients Only)       Balance Overall balance assessment: Needs assistance Sitting-balance support: Feet supported;No upper extremity supported Sitting balance-Leahy Scale: Fair     Standing balance support: Single extremity supported;During functional activity Standing balance-Leahy Scale: Fair Standing balance comment: using SPC                             Pertinent Vitals/Pain Pain Assessment: 0-10 Pain Score: 6  Pain Location: right side of low back with radiation down hip Pain Descriptors / Indicators: Sharp;Shooting;Spasm;Grimacing;Guarding Pain Intervention(s): Limited activity within patient's tolerance;Monitored during session;Patient requesting pain meds-RN notified    Home Living Family/patient expects to be discharged to:: Private residence Living Arrangements: Alone Available Help at Discharge: Other (Comment)(no help, her brother disabled) Type of Home: Apartment Home Access: Level entry     Home Layout: One level Home Equipment: Cane - single  point;Grab bars - tub/shower      Prior Function Level of  Independence: Independent         Comments: household and short distanced community ambulator with [facility] PRN     Hand Dominance   Dominant Hand: Right    Extremity/Trunk Assessment   Upper Extremity Assessment Upper Extremity Assessment: Generalized weakness    Lower Extremity Assessment Lower Extremity Assessment: Generalized weakness    Cervical / Trunk Assessment Cervical / Trunk Assessment: Normal  Communication   Communication: HOH  Cognition Arousal/Alertness: Awake/alert Behavior During Therapy: WFL for tasks assessed/performed Overall Cognitive Status: Within Functional Limits for tasks assessed                                        General Comments      Exercises     Assessment/Plan    PT Assessment Patient needs continued PT services  PT Problem List Decreased strength;Decreased activity tolerance;Decreased balance;Decreased mobility;Pain       PT Treatment Interventions Gait training;Stair training;Functional mobility training;Therapeutic activities;Patient/family education;Therapeutic exercise    PT Goals (Current goals can be found in the Care Plan section)  Acute Rehab PT Goals Patient Stated Goal: return home after rehab PT Goal Formulation: With patient Time For Goal Achievement: 09/02/18 Potential to Achieve Goals: Good    Frequency Min 3X/week   Barriers to discharge        Co-evaluation               AM-PAC PT "6 Clicks" Mobility  Outcome Measure Help needed turning from your back to your side while in a flat bed without using bedrails?: A Little Help needed moving from lying on your back to sitting on the side of a flat bed without using bedrails?: A Lot Help needed moving to and from a bed to a chair (including a wheelchair)?: A Little Help needed standing up from a chair using your arms (e.g., wheelchair or bedside chair)?: A Little Help needed to walk in hospital room?: A Little Help needed climbing 3-5  steps with a railing? : A Lot 6 Click Score: 16    End of Session Equipment Utilized During Treatment: Gait belt Activity Tolerance: Patient tolerated treatment well;Patient limited by fatigue;Patient limited by pain Patient left: in bed;with call bell/phone within reach;with nursing/sitter in room Nurse Communication: Mobility status PT Visit Diagnosis: Unsteadiness on feet (R26.81);Other abnormalities of gait and mobility (R26.89);Muscle weakness (generalized) (M62.81)    Time: 4098-1191 PT Time Calculation (min) (ACUTE ONLY): 38 min   Charges:   PT Evaluation $PT Eval Moderate Complexity: 1 Mod PT Treatments $Therapeutic Activity: 23-37 mins        10:22 AM, 08/19/18 Lonell Grandchild, MPT Physical Therapist with [facility] 336 [phone] office [phone] mobile phone

## 2018-08-19 NOTE — NC FL2 (Signed)
[place] LEVEL OF CARE SCREENING TOOL     IDENTIFICATION  Patient Name: [name] Birthdate: 12-25-1935 Sex: female Admission Date (Current Location): 08/18/2018  [facility] and [place] Number:  [employer] and Address:  [place] [address], [place]      Provider Number: [id number]  Attending Physician Name and Address:  Orson Eva, MD  Relative Name and Phone Number:  Quincy Sheehan [phone]    Current Level of Care: Hospital Recommended Level of Care: [place] Prior Approval Number:    Date Approved/Denied:   PASRR Number: [id number]  Discharge Plan: SNF    Current Diagnoses: Patient Active Problem List   Diagnosis Date Noted  . Low back pain 08/19/2018  . Constipation 08/19/2018  . Chronic diastolic CHF (congestive heart failure) ([place]) 08/19/2018  . Acquired hypothyroidism 08/19/2018  . Intractable pain 08/19/2018  . Essential hypertension 08/19/2018  . T12 compression fracture ([place]) 08/18/2018  . Breast cancer ([place]) 06/23/2016  . Change in bowel habits 01/11/2015  . Loss of weight 01/11/2015  . Abdominal pain, chronic, right upper quadrant 01/11/2015  . Unspecified chronic bronchitis ([place]) 09/25/2013  . Other dysphagia 04/01/2013  . Cough 01/20/2013  . Thrush 10/28/2012  . Chest pain 09/26/2012  . H/O adenomatous polyp of colon 11/18/2011  . Chronic anticoagulation 11/18/2011    Orientation RESPIRATION BLADDER Height & Weight     Self, Time, Situation, Place  Normal Continent Weight: 64.5 kg Height:  5\' 2"  (157.5 cm)  BEHAVIORAL SYMPTOMS/MOOD NEUROLOGICAL BOWEL NUTRITION STATUS      Continent Diet(heart healthy)  AMBULATORY STATUS COMMUNICATION OF NEEDS Skin   Limited Assist Verbally Normal                       Personal Care Assistance Level of Assistance  Bathing, Feeding, Dressing Bathing Assistance: Limited assistance Feeding assistance:  Independent Dressing Assistance: Limited assistance     Functional Limitations Info  Sight, Hearing, Speech Sight Info: Impaired(glasses) Hearing Info: Impaired(hearing aids) Speech Info: Impaired    SPECIAL CARE FACTORS FREQUENCY  PT (By licensed PT)     PT Frequency: 5 days/week              Contractures Contractures Info: Not present    Additional Factors Info  Code Status, Allergies Code Status Info: full Allergies Info: epinephrine, demerol, vicodin           Current Medications (08/19/2018):  This is the current hospital active medication list Current Facility-Administered Medications  Medication Dose Route Frequency Provider Last Rate Last Dose  . acetaminophen (TYLENOL) tablet 650 mg  650 mg Oral Q6H PRN Howerter, Justin B, DO       Or  . acetaminophen (TYLENOL) suppository 650 mg  650 mg Rectal Q6H PRN Howerter, Justin B, DO      . albuterol (PROVENTIL) (2.5 MG/3ML) 0.083% nebulizer solution 2.5 mg  2.5 mg Nebulization Q6H PRN Howerter, Justin B, DO      . amLODipine (NORVASC) tablet 5 mg  5 mg Oral Daily Howerter, Justin B, DO   5 mg at 08/19/18 0934  . buPROPion (WELLBUTRIN XL) 24 hr tablet 150 mg  150 mg Oral Daily Howerter, Justin B, DO   150 mg at 08/19/18 0935  . cholecalciferol (VITAMIN D3) tablet 1,000 Units  1,000 Units Oral Daily Howerter, Justin B, DO   1,000 Units at 08/19/18 0934  . docusate sodium (COLACE) capsule 100 mg  100 mg Oral BID Howerter, Justin B, DO   100 mg at 08/19/18 0934  . ezetimibe (ZETIA) tablet 10 mg  10 mg Oral Daily Howerter, Justin B, DO   10 mg at 08/19/18 0935  . ferrous sulfate tablet 325 mg  325 mg Oral Daily Howerter, Justin B, DO   325 mg at 08/19/18 0934  . furosemide (LASIX) tablet 40 mg  40 mg Oral Daily Howerter, Justin B, DO   40 mg at 08/19/18 0935  . ibuprofen (ADVIL,MOTRIN) tablet 400 mg  400 mg Oral Q6H PRN Howerter, Justin B, DO      . levothyroxine (SYNTHROID, LEVOTHROID) tablet 50 mcg  50 mcg Oral Q0600  Howerter, Justin B, DO   50 mcg at 08/19/18 0600  . methocarbamol (ROBAXIN) tablet 500 mg  500 mg Oral TID Orson Eva, MD   500 mg at 08/19/18 0934  . metoprolol tartrate (LOPRESSOR) tablet 12.5 mg  12.5 mg Oral BID Howerter, Justin B, DO   12.5 mg at 08/19/18 0934  . morphine 2 MG/ML injection 2 mg  2 mg Intravenous Q4H PRN Howerter, Justin B, DO      . ondansetron (ZOFRAN) tablet 4 mg  4 mg Oral Q6H PRN Howerter, Justin B, DO       Or  . ondansetron (ZOFRAN) injection 4 mg  4 mg Intravenous Q6H PRN Howerter, Justin B, DO      . polyethylene glycol (MIRALAX / GLYCOLAX) packet 17 g  17 g Oral Daily PRN Howerter, Justin B, DO      . traMADol (ULTRAM) tablet 50 mg  50 mg Oral Therisa Doyne, MD   50 mg at 08/19/18 0934  . vitamin C (ASCORBIC ACID) tablet 1,000 mg  1,000 mg Oral Daily Howerter, Justin B, DO   1,000 mg at 08/19/18 0934  . warfarin (COUMADIN) tablet 2 mg  2 mg Oral ONCE-1800 Coffee, Donna Christen, [facility]      . Warfarin - Pharmacist Dosing Inpatient   Does not apply q1800 Coffee, Donna Christen [facility]         Discharge Medications: Please see discharge summary for a list of discharge medications.  Relevant Imaging Results:  Relevant Lab Results:   Additional Information SS# [id number]  Sherald Barge, RN

## 2018-08-19 NOTE — Evaluation (Signed)
Occupational Therapy Evaluation Patient Details Name: [name] MRN: [id number] DOB: [birth year] Today's Date: 08/19/2018    History of Present Illness KIEANA LIVESAY is a 83 y.o. female with medical history significant for multiple acute ischemic CVAs prompting chronic anticoagulation on warfarin, chronic abdominal pain, acquired hypothyroidism, hypertension, chronic diastolic heart failure, who is admitted to [facility] on 08/18/2018 for pain control in setting of progression of T12 compression fracture at presenting from home to [facility] ED complaining of low back pain.    Clinical Impression   Pt reporting significant hx of back issues and pain, reports this is worst in a while. Pt occasionally gasping and grabbing back during bed mobility, once standing seemed improved. Pt performing ADLs with supervision, insists on completing tasks in standing (toileting/dressing). Pt has fairly good balance when completing, lightly leaning against one wall. OT educated on alternate dressing technique in sitting, to improve safety and decrease strain on her back. Pt verbalized understanding. Pt is near baseline with ADLs, continues to complete despite pain. No further OT services required at this time.      Follow Up Recommendations  No OT follow up    Equipment Recommendations  Tub/shower seat       Precautions / Restrictions Precautions Precautions: Back Precaution Comments: T12 compression fx Restrictions Weight Bearing Restrictions: No      Mobility Bed Mobility Overal bed mobility: Modified Independent             General bed mobility comments: Increased time for completion. Pt log rolling to side and then sitting up  Transfers Overall transfer level: Needs assistance Equipment used: None Transfers: Sit to/from [employer] Sit to Stand: Min guard Stand pivot transfers: Supervision                ADL either performed or assessed  with clinical judgement   ADL Overall ADL's : Needs assistance/impaired Eating/Feeding: Modified independent;Sitting   Grooming: Wash/dry hands;Modified independent;Standing Grooming Details (indicate cue type and reason): No difficulty standing at sink for hand washing             Lower Body Dressing: Supervision/safety;Sit to/from stand Lower Body Dressing Details (indicate cue type and reason): Pt donning pants and socks while standing and leaning against doorway Toilet Transfer: Supervision/safety;Ambulation;Grab bars Toilet Transfer Details (indicate cue type and reason): Pt refuses to sit on toilet, therefore held a squat during toileting, holding to grab bar Toileting- Clothing Manipulation and Hygiene: Supervision/safety;Sit to/from stand Toileting - Clothing Manipulation Details (indicate cue type and reason): Pt doffing one leg of pajama pants and underwear for toileting, then donned after finished. Pt stood in bathroom and leaned against wall for balance while bending over and completing clothing manipulation. She then donned sock in the same fashion     Functional mobility during ADLs: Supervision/safety       Vision Baseline Vision/History: Wears glasses Wears Glasses: At all times Patient Visual Report: No change from baseline              Pertinent Vitals/Pain Pain Assessment: 0-10 Pain Score: 4  Pain Location: low back Pain Descriptors / Indicators: Aching;Discomfort;Grimacing;Guarding;Sharp Pain Intervention(s): Limited activity within patient's tolerance;Monitored during session;Repositioned     Hand Dominance Right   Extremity/Trunk Assessment Upper Extremity Assessment Upper Extremity Assessment: Overall WFL for tasks assessed   Lower Extremity Assessment Lower Extremity Assessment: Defer to PT evaluation       Communication Communication Communication: HOH(wears hearing aids)   Cognition Arousal/Alertness: Awake/alert  Behavior During  Therapy: WFL for tasks assessed/performed Overall Cognitive Status: Within Functional Limits for tasks assessed                                                Home Living Family/patient expects to be discharged to:: Private residence Living Arrangements: Alone Available Help at Discharge: Other (Comment)(no family in state) Type of Home: Apartment Home Access: Level entry     Home Layout: One level     Bathroom Shower/Tub: Teacher, early years/pre: Standard     Home Equipment: Cane - single point;Grab bars - tub/shower          Prior Functioning/Environment Level of Independence: Independent        Comments: drives locally        OT Problem List: Pain       AM-PAC OT "6 Clicks" Daily Activity     Outcome Measure Help from another person eating meals?: None Help from another person taking care of personal grooming?: None Help from another person toileting, which includes using toliet, bedpan, or urinal?: None Help from another person bathing (including washing, rinsing, drying)?: None Help from another person to put on and taking off regular upper body clothing?: None Help from another person to put on and taking off regular lower body clothing?: None 6 Click Score: 24   End of Session Nurse Communication: Mobility status  Activity Tolerance: Patient tolerated treatment well Patient left: in bed;with call bell/phone within reach  OT Visit Diagnosis: Muscle weakness (generalized) (M62.81);Pain Pain - Right/Left: (low) Pain - part of body: (back)                Time: 3646-8032 OT Time Calculation (min): 33 min Charges:  OT General Charges $OT Visit: 1 Visit OT Evaluation $OT Eval Low Complexity: [place], OTR/L  [phone] 08/19/2018, 8:21 AM

## 2018-08-20 DIAGNOSIS — S22080G Wedge compression fracture of T11-T12 vertebra, subsequent encounter for fracture with delayed healing: Secondary | ICD-10-CM

## 2018-08-20 DIAGNOSIS — I10 Essential (primary) hypertension: Secondary | ICD-10-CM

## 2018-08-20 LAB — CBC
HCT: 39.9 % (ref 36.0–46.0)
Hemoglobin: 13.2 g/dL (ref 12.0–15.0)
MCH: 29.1 pg (ref 26.0–34.0)
MCHC: 33.1 g/dL (ref 30.0–36.0)
MCV: 88.1 fL (ref 80.0–100.0)
NRBC: 0 % (ref 0.0–0.2)
Platelets: 278 10*3/uL (ref 150–400)
RBC: 4.53 MIL/uL (ref 3.87–5.11)
RDW: 13.7 % (ref 11.5–15.5)
WBC: 5.6 10*3/uL (ref 4.0–10.5)

## 2018-08-20 LAB — PROTIME-INR
INR: 2.5 — ABNORMAL HIGH (ref 0.8–1.2)
Prothrombin Time: 26.4 seconds — ABNORMAL HIGH (ref 11.4–15.2)

## 2018-08-20 MED ORDER — POLYETHYLENE GLYCOL 3350 17 G PO PACK
17.0000 g | PACK | Freq: Every day | ORAL | Status: DC
  Administered 2018-08-20 – 2018-08-24 (×4): 17 g via ORAL
  Filled 2018-08-20 (×5): qty 1

## 2018-08-20 MED ORDER — SENNA 8.6 MG PO TABS
2.0000 | ORAL_TABLET | Freq: Every day | ORAL | Status: DC
  Administered 2018-08-20 – 2018-08-24 (×5): 17.2 mg via ORAL
  Filled 2018-08-20 (×5): qty 2

## 2018-08-20 MED ORDER — OXYCODONE HCL 5 MG PO TABS
5.0000 mg | ORAL_TABLET | ORAL | Status: DC | PRN
  Administered 2018-08-21 – 2018-08-24 (×7): 5 mg via ORAL
  Filled 2018-08-20 (×7): qty 1

## 2018-08-20 MED ORDER — WARFARIN SODIUM 2 MG PO TABS: 3.0000 mg | ORAL_TABLET | Freq: Once | ORAL | Status: DC

## 2018-08-20 NOTE — Progress Notes (Signed)
ANTICOAGULATION CONSULT NOTE - Follow Up Consult  Pharmacy Consult for warfarin Indication: multiple ischemic CVAs   Allergies  Allergen Reactions  . Epinephrine Anaphylaxis  . Demerol [Meperidine] Other (See Comments)    headaches  . Vicodin [Hydrocodone-Acetaminophen] Other (See Comments)    [name] it makes her crazy. Pt tolerates acetaminophen    Patient Measurements: Height: 5\' 2"  (157.5 cm) Weight: 145 lb 4.5 oz (65.9 kg) IBW/kg (Calculated) : 50.1   Vital Signs: Temp: 98.1 F (36.7 C) (03/27 0449) Temp Source: Oral (03/27 0449) BP: 141/79 (03/27 0449) Pulse Rate: 65 (03/27 0449)  Labs: Recent Labs    08/18/18 1205 08/18/18 1758 08/19/18 0457 08/19/18 0845 08/20/18 0502  HGB 14.6  --  13.8  --  13.2  HCT 45.5  --  43.8  --  39.9  PLT 300  --  285  --  278  LABPROT  --  30.0*  --  29.5* 26.4*  INR  --  2.9*  --  2.9* 2.5*  CREATININE 1.12*  --  1.16*  --   --     Estimated Creatinine Clearance: 33.9 mL/min (A) (by C-G formula based on SCr of 1.16 mg/dL (H)).   Medications:  Medications Prior to Admission  Medication Sig Dispense Refill Last Dose  . albuterol (PROVENTIL) (2.5 MG/3ML) 0.083% nebulizer solution Take 3 mLs (2.5 mg total) by nebulization every 6 (six) hours as needed for wheezing or shortness of breath. 75 mL 12 Past Week at Unknown time  . amLODipine (NORVASC) 5 MG tablet TAKE 1 TABLET BY MOUTH EVERY DAY 30 tablet 6 08/18/2018 at Unknown time  . Ascorbic Acid (VITAMIN C) 1000 MG tablet Take 1,000 mg by mouth daily.   08/18/2018 at Unknown time  . B Complex Vitamins (VITAMIN B COMPLEX) TABS Take 1 tablet by mouth daily.   08/18/2018 at Unknown time  . Biotin 300 MCG TABS Take 1 tablet by mouth daily after supper.    08/17/2018 at Unknown time  . buPROPion (WELLBUTRIN XL) 150 MG 24 hr tablet Take 150 mg by mouth daily.   08/18/2018 at Unknown time  . CALCIUM CARBONATE PO Take 1 tablet by mouth daily. Patient states medication is calcium 1200 mg without  vitamin D.   08/18/2018 at Unknown time  . cholecalciferol (VITAMIN D) 1000 UNITS tablet Take 1,000 Units by mouth daily.   08/18/2018 at Unknown time  . CYANOCOBALAMIN PO Take 1 tablet by mouth daily. Patient unsure of strength   08/18/2018 at Unknown time  . ezetimibe (ZETIA) 10 MG tablet Take 10 mg by mouth daily.   08/18/2018 at Unknown time  . Ferrous Gluconate (IRON) 240 (27 FE) MG TABS Take 1 tablet by mouth daily.   08/17/2018 at Unknown time  . Flaxseed, Linseed, (FLAXSEED OIL) 1000 MG CAPS Take 1 capsule by mouth.    08/17/2018 at Unknown time  . furosemide (LASIX) 20 MG tablet Take 20 mg by mouth daily after supper.   08/17/2018 at Unknown time  . furosemide (LASIX) 40 MG tablet TAKE 1 TABLET BY MOUTH TWICE DAILY (Patient taking differently: 40 mg daily. ) 180 tablet 3 08/18/2018 at Unknown time  . Lecithin 1200 MG CAPS Take 1 capsule by mouth daily.   08/17/2018 at Unknown time  . levothyroxine (SYNTHROID, LEVOTHROID) 75 MCG tablet Take 50 mcg by mouth daily.   4 08/18/2018 at Unknown time  . LUTEIN PO Take 1 capsule by mouth daily. Strength varies based on what is purchased at the  time.   08/17/2018 at Unknown time  . Magnesium 250 MG TABS Take 1 tablet by mouth daily.   08/18/2018 at Unknown time  . metoprolol tartrate (LOPRESSOR) 25 MG tablet TAKE 1 TABLET BY MOUTH TWICE DAILY (Patient taking differently: 12.5 mg 2 (two) times daily. ) 60 tablet 3 08/18/2018 at Unknown time  . Nebulizers (COMPRESSOR/NEBULIZER) MISC Use as directed 1 each 0 Past Week at Unknown time  . Omega-3 Fatty Acids (FISH OIL) 1200 MG CAPS Take 1,200 mg by mouth daily.   08/17/2018 at Unknown time  . pantoprazole (PROTONIX) 40 MG tablet Take 1 tablet by mouth daily as needed.   08/18/2018 at Unknown time  . potassium chloride (K-DUR) 10 MEQ tablet Take 10 mEq by mouth 2 (two) times daily.   08/18/2018 at Unknown time  . Turmeric 500 MG TABS Take by mouth daily.   08/17/2018 at Unknown time  . warfarin (COUMADIN) 4 MG tablet Take  4 mg by mouth daily.   08/17/2018 at Unknown time  . Zinc 50 MG TABS Take 1 tablet by mouth daily.   08/18/2018 at Unknown time   Scheduled:  . amLODipine  5 mg Oral Daily  . buPROPion  150 mg Oral Daily  . cholecalciferol  1,000 Units Oral Daily  . docusate sodium  100 mg Oral BID  . ezetimibe  10 mg Oral Daily  . ferrous sulfate  325 mg Oral Daily  . furosemide  40 mg Oral Daily  . levothyroxine  50 mcg Oral Q0600  . methocarbamol  500 mg Oral TID  . metoprolol tartrate  12.5 mg Oral BID  . traMADol  50 mg Oral Q12H  . vitamin C  1,000 mg Oral Daily  . Warfarin - Pharmacist Dosing Inpatient   Does not apply q1800   Infusions:   PRN: acetaminophen **OR** acetaminophen, albuterol, ibuprofen, morphine injection, ondansetron **OR** ondansetron (ZOFRAN) IV, polyethylene glycol Anti-infectives (From admission, onward)   None      Assessment: 83 year old female with history of ischemic CVAs, takes warfarin 4mg  po daily, admitted with INR of 2.9 3/25, INR 2/26 2.9. INR is therapeutic, INR goal 2-3.  Date     Dose    INR 3/25       0           2.9 3/26       2mg       2.9 3/27       3mg       2.5     Goal of Therapy:  INR 2-3 Monitor platelets by anticoagulation protocol: Yes   Plan:  Warfarin 3mg x 1 dose tonight, Will re-evaluate tomorrow. CBC/INR daily, will monitor for signs and symptoms of bleeding.    [name] 08/20/2018,8:45 AM

## 2018-08-20 NOTE — Progress Notes (Signed)
Physical Therapy Treatment Patient Details Name: [name] MRN: [id number] DOB: April 03, 1936 Today's Date: 08/20/2018    History of Present Illness [name] is a 83 y.o. female with medical history significant for multiple acute ischemic CVAs prompting chronic anticoagulation on warfarin, chronic abdominal pain, acquired hypothyroidism, hypertension, chronic diastolic heart failure, who is admitted to [facility] on 08/18/2018 for pain control in setting of progression of T12 compression fracture at presenting from home to [facility] ED complaining of low back pain.     PT Comments    Patient was found awake and alert in bedside chair reading upon therapist entering. Patient reported that her back pain was severe today. She stated that her back pain is more severe today and that she feels she needs a RW to ambulate this session. Patient performed transfers with supervision to min guard assist. Patient ambulated 70 feet with RW pausing intermittently due to pain. Patient returned to bedside chair all needs met. Patient would continue to benefit from skilled physical therapy while in the hospital and at recommended venue below in order to improve strength, activity tolerance, gait, and overall functional mobility.    Follow Up Recommendations  SNF;Supervision - Intermittent     Equipment Recommendations  Rolling walker with 5" wheels;Other (comment)(TSLO brace to stabilize back)    Recommendations for Other Services       Precautions / Restrictions Precautions Precautions: Back;Fall Precaution Comments: T12 compression fx Restrictions Weight Bearing Restrictions: No    Mobility  Bed Mobility                  Transfers Overall transfer level: Needs assistance Equipment used: Rolling walker (2 wheeled) Transfers: Sit to/from [facility] Sit to Stand: Min guard Stand pivot transfers: Supervision       General transfer comment:  slow labored movement used RW due to pain level patient stated it was hurting more than previously  Ambulation/Gait Ambulation/Gait assistance: Min assist Gait Distance (Feet): 70 Feet Assistive device: Straight cane Gait Pattern/deviations: Decreased stride length;Decreased step length - right;Decreased step length - left Gait velocity: decreased   General Gait Details: slow labored cadence with frequent stopping due to increased pain/spasming in low back, slightly decreased proprioception of RLE possibly due to old CVA per patient   Stairs             Wheelchair Mobility    Modified Rankin (Stroke Patients Only)       Balance Overall balance assessment: Needs assistance Sitting-balance support: Feet supported;No upper extremity supported Sitting balance-Leahy Scale: Fair     Standing balance support: During functional activity;Bilateral upper extremity supported Standing balance-Leahy Scale: Fair Standing balance comment: using RW                            Cognition Arousal/Alertness: Awake/alert Behavior During Therapy: WFL for tasks assessed/performed Overall Cognitive Status: Within Functional Limits for tasks assessed                                        Exercises      General Comments        Pertinent Vitals/Pain Pain Assessment: 0-10 Pain Score: 8  Pain Location: Back pain more on the right side radiating down toward right side Pain Descriptors / Indicators: Stabbing Pain Intervention(s): Limited activity within patient's tolerance;Monitored during  session;Premedicated before session    Home Living                      Prior Function            PT Goals (current goals can now be found in the care plan section) Acute Rehab PT Goals Patient Stated Goal: return home after rehab PT Goal Formulation: With patient Time For Goal Achievement: 09/02/18 Potential to Achieve Goals: Good Progress towards PT  goals: Progressing toward goals    Frequency    Min 3X/week      PT Plan      Co-evaluation              AM-PAC PT "6 Clicks" Mobility   Outcome Measure  Help needed turning from your back to your side while in a flat bed without using bedrails?: A Little Help needed moving from lying on your back to sitting on the side of a flat bed without using bedrails?: A Lot Help needed moving to and from a bed to a chair (including a wheelchair)?: A Little Help needed standing up from a chair using your arms (e.g., wheelchair or bedside chair)?: A Little Help needed to walk in hospital room?: A Little Help needed climbing 3-5 steps with a railing? : A Lot 6 Click Score: 16    End of Session Equipment Utilized During Treatment: Gait belt Activity Tolerance: Patient tolerated treatment well;Patient limited by fatigue;Patient limited by pain Patient left: with call bell/phone within reach;in chair Nurse Communication: Mobility status PT Visit Diagnosis: Unsteadiness on feet (R26.81);Other abnormalities of gait and mobility (R26.89);Muscle weakness (generalized) (M62.81)     Time: 7829-6039 PT Time Calculation (min) (ACUTE ONLY): 18 min  Charges:  $Therapeutic Activity: 8-22 mins                     Clarene Critchley PT, DPT 11:52 AM, 08/20/18 [phone]

## 2018-08-20 NOTE — Progress Notes (Signed)
PROGRESS NOTE  [name] XFG:[id number] DOB: [birth year] DOA: 08/18/2018 PCP: Celene Squibb, MD  Brief History:  83 year old female with a history of ischemic CVAs in the past resulting in warfarin anticoagulation, chronic abdominal pain, hypertension, hypothyroidism presenting with worsening back pain over 1 week.  The patient states that she was at the grocery store getting a pack of bottled water.  After lifting the water and transferring her to her grocery cart, the patient had immediate increase in her lower back pain.  Fortunately, the patient was able to subsequently drive home, and she even transferred the package of water onto a dolly and back into her house.  Over the next 1 to 2 days, the patient related increasing lower back pain.  She stated that in the past week she has visited her primary care provider twice.  She stated that no radiographs were performed, but she was started on some type of muscle relaxer.  She thinks that it may have been cyclobenzaprine.  She stated that it did not help much.  Because of increasing pain, this has limited the patient's ability to perform her usual ADLs.  Fortunately, the patient is still able to make transfers and get to the bathroom albeit with significant pain and taking a significant amount of time.  She denies any recent additional falls or trauma.  She denies any saddle anesthesia, focal unilateral leg or arm weakness, bowel or bladder incontinence.  She denies any fevers or chills.  The patient states that she is only been using acetaminophen around-the-clock.  She does not want to use any " strong pain medicine" that would cause her to have somnolence.  She also complains of constipation with her last bowel movement on 08/17/2018.  Normally she has a bowel movement almost daily.  Because of worsening pain, the patient presented to emergency department for further evaluation.  CT of the abdomen and pelvis showed slight interval  progression of her T12 compression deformity since 03/2018.  The case was discussed with IR whom recommended MRI of the thoracic spine to see if the patient may be a candidate for kyphoplasty.  Assessment/Plan: Intractable back pain/T12 compression fracture -The patient wants to avoid " strong pain medicine" -continue tramadol -continue Robaxin -Acetaminophen around-the-clock -PT evaluation--SNF -MRI thoracic spine--Mild to moderate compression fracture T12 with bone marrow edema and paraspinous soft tissue edema compatible with acute or subacute fracture -start oxyIR as pt was able to tolerate IV morphine -case discussed with IR, Dr. Bradd Burner for kyphoplasty on 3/30  Constipation/chronic abdominal pain -08/18/2018 CT abdomen pelvis--unchanged hepatic cyst, unchanged GB polyp versus stone with unchanged mild CBD and central intrahepatic biliary ductal dilatation; prominent colonic stool; decreased mild left hydronephrosis to the level of UPJ -Start MiraLAX daily -Start Senokot  History Ischemic CVAs -holding warfarin for now in anticipation should pt require intervention for T12 compression fracture  Essential hypertension -BP elevated in part due to pain -Continue amlodipine and metoprolol tartrate  Hypothyroidism -Continue Synthroid  CKD stage III -Baseline creatinine 0.9-1.1  Depression/anxiety -Continue Wellbutrin     Disposition Plan: SNF after kyphoplasty Family Communication:   Family at bedside  Consultants:  none Code Status:  FULL  DVT Prophylaxis: warfarin   Procedures: As Listed in Progress Note Above  Antibiotics: None     Subjective: Pt in back is slightly better but still severe, worse with any movement.  Patient denies fevers, chills, headache, chest pain, dyspnea, nausea,  vomiting, diarrhea, abdominal pain, dysuria, hematuria, hematochezia, and melena.   Objective: Vitals:   08/19/18 1423 08/19/18 2010 08/19/18  2132 08/20/18 0449  BP: 110/66  (!) 134/101 (!) 141/79  Pulse: 61  70 65  Resp: 20  (!) 22 17  Temp: 97.9 F (36.6 C)  97.8 F (36.6 C) 98.1 F (36.7 C)  TempSrc: Oral  Oral Oral  SpO2: 99% 97% 99% 93%  Weight:    65.9 kg  Height:        Intake/Output Summary (Last 24 hours) at 08/20/2018 1237 Last data filed at 08/20/2018 7564 Gross per 24 hour  Intake 960 ml  Output --  Net 960 ml   Weight change: 6.932 kg Exam:   General:  Pt is alert, follows commands appropriately, not in acute distress  HEENT: No icterus, No thrush, No neck mass, [place]/AT  Cardiovascular: RRR, S1/S2, no rubs, no gallops  Respiratory: CTA bilaterally, no wheezing, no crackles, no rhonchi  Abdomen: Soft/+BS, non tender, non distended, no guarding  Extremities: No edema, No lymphangitis, No petechiae, No rashes, no synovitis  -Neuro:  CN II-XII intact, strength 4/5 in RUE, RLE, strength 4/5 LUE, LLE; sensation intact bilateral; no dysmetria; babinski equivocal     Data Reviewed: I have personally reviewed following labs and imaging studies Basic Metabolic Panel: Recent Labs  Lab 08/18/18 1205 08/19/18 0457  NA 141 143  K 4.3 4.2  CL 101 102  CO2 30 30  GLUCOSE 107* 85  BUN 24* 20  CREATININE 1.12* 1.16*  CALCIUM 9.4 9.1  MG  --  2.7*   Liver Function Tests: Recent Labs  Lab 08/18/18 1205  AST 33  ALT 26  ALKPHOS 89  BILITOT 0.6  PROT 7.3  ALBUMIN 4.4   No results for input(s): LIPASE, AMYLASE in the last 168 hours. No results for input(s): AMMONIA in the last 168 hours. Coagulation Profile: Recent Labs  Lab 08/18/18 1758 08/19/18 0845 08/20/18 0502  INR 2.9* 2.9* 2.5*   CBC: Recent Labs  Lab 08/18/18 1205 08/19/18 0457 08/20/18 0502  WBC 7.0 6.1 5.6  NEUTROABS 5.2  --   --   HGB 14.6 13.8 13.2  HCT 45.5 43.8 39.9  MCV 90.1 90.9 88.1  PLT 300 285 278   Cardiac Enzymes: No results for input(s): CKTOTAL, CKMB, CKMBINDEX, TROPONINI in the last 168  hours. BNP: Invalid input(s): POCBNP CBG: No results for input(s): GLUCAP in the last 168 hours. HbA1C: No results for input(s): HGBA1C in the last 72 hours. Urine analysis:    Component Value Date/Time   COLORURINE STRAW (A) 08/18/2018 1139   APPEARANCEUR CLEAR 08/18/2018 1139   LABSPEC 1.005 08/18/2018 1139   PHURINE 8.0 08/18/2018 1139   GLUCOSEU NEGATIVE 08/18/2018 1139   HGBUR NEGATIVE 08/18/2018 1139   BILIRUBINUR NEGATIVE 08/18/2018 1139   KETONESUR NEGATIVE 08/18/2018 1139   PROTEINUR NEGATIVE 08/18/2018 1139   NITRITE NEGATIVE 08/18/2018 1139   LEUKOCYTESUR TRACE (A) 08/18/2018 1139   Sepsis Labs: @LABRCNTIP (procalcitonin:4,lacticidven:4) )No results found for this or any previous visit (from the past 240 hour(s)).   Scheduled Meds:  amLODipine  5 mg Oral Daily   buPROPion  150 mg Oral Daily   cholecalciferol  1,000 Units Oral Daily   ezetimibe  10 mg Oral Daily   ferrous sulfate  325 mg Oral Daily   furosemide  40 mg Oral Daily   levothyroxine  50 mcg Oral Q0600   methocarbamol  500 mg Oral TID   metoprolol tartrate  12.5 mg Oral BID   polyethylene glycol  17 g Oral Daily   traMADol  50 mg Oral Q12H   vitamin C  1,000 mg Oral Daily   Warfarin - Pharmacist Dosing Inpatient   Does not apply q1800   Continuous Infusions:  Procedures/Studies: Dg Thoracic Spine W/swimmers  Result Date: 08/18/2018 CLINICAL DATA:  Thoracic spine pain since lifting water a week ago. EXAM: THORACIC SPINE - 3 VIEWS COMPARISON:  CT chest 09/20/2012 and CT abdomen and pelvis 01/15/2015. FINDINGS: S shaped thoracolumbar scoliosis is identified. Remote T11 and T12 compression fractures are noted. No acute fracture. Mild multilevel degenerative change noted. IMPRESSION: No acute finding. S shaped scoliosis. Mild, remote T11 and T12 compression fractures. Electronically Signed   By: Inge Rise M.D.   On: 08/18/2018 10:34   Dg Lumbar Spine Complete  Result Date:  08/18/2018 CLINICAL DATA:  Low back pain for 1 week radiating into both legs after lifting water. EXAM: LUMBAR SPINE - COMPLETE 4+ VIEW COMPARISON:  CT abdomen and pelvis 01/15/2015. FINDINGS: Convex left scoliosis is unchanged. The patient has remote T12 superior endplate and G25 inferior endplate compression fractures which are unchanged. Lower lumbar facet arthropathy is noted. Mild loss of disc space height is seen at L5-S1. Paraspinous structures demonstrate a stone in the lower pole of the left kidney, unchanged. Massive volume of stool throughout the colon is seen. IMPRESSION: No acute abnormality or change compared to the prior exam. Mild, remote T11 and T12 compression fractures. Lower lumbar spondylosis. Nonobstructing stone left kidney, unchanged. Very large volume of stool throughout the colon. Electronically Signed   By: Inge Rise M.D.   On: 08/18/2018 10:30   Mr Thoracic Spine Wo Contrast  Result Date: 08/19/2018 CLINICAL DATA:  Pre vertebroplasty. T12 fracture. Back pain 2 months EXAM: MRI THORACIC AND LUMBAR SPINE WITHOUT CONTRAST TECHNIQUE: Multiplanar and multiecho pulse sequences of the thoracic and lumbar spine were obtained without intravenous contrast. COMPARISON:  CT lumbar spine 08/18/2018 FINDINGS: MRI THORACIC SPINE FINDINGS Alignment: Mild anterolisthesis C7-T1, T2-3, T3-4. Mild retrolisthesis T11-12 Vertebrae: Mild to moderate compression fracture T12 with diffuse bone marrow edema. Paraspinous edema bilaterally compatible with recent fracture. Mild retropulsion of the superior endplate of K27 into the canal without significant stenosis. Cord: Negative for cord compression. Normal cord signal without cord lesion. Paraspinal and other soft tissues: Paraspinous soft tissue swelling around the T12 fracture bilaterally. No mass lesion. Disc levels: Advanced disc degeneration throughout the entire lumbar spine with disc space narrowing and spurring diffusely. Arthrodesis at T7-8. No  cord compression or significant spinal stenosis. Mild foraminal narrowing bilaterally at multiple levels due to spurring. MRI LUMBAR SPINE FINDINGS Segmentation:  Normal Alignment:  Mild retrolisthesis L1-2 and L2-3 Vertebrae: Mild to moderate fracture of T12 which appears acute or subacute. No lumbar fracture identified. Conus medullaris and cauda equina: Conus extends to the L1 level. Conus and cauda equina appear normal. Paraspinal and other soft tissues: Negative for paraspinous mass or edema Disc levels: L1-2: Mild disc degeneration with Schmorl's node L2-3: Disc degeneration with Schmorl's node. Mild disc bulging and endplate spurring without significant stenosis L2-3: Mild disc and moderate facet degeneration. Negative for stenosis L4-5: Diffuse bulging of the disc with moderate facet degeneration. Negative for stenosis L5-S1: Disc degeneration and spurring left greater than right with left foraminal encroachment. Bilateral facet hypertrophy. IMPRESSION: MR THORACIC SPINE IMPRESSION Mild to moderate compression fracture T12 with bone marrow edema and paraspinous soft tissue edema compatible with acute or subacute fracture.  Mild retropulsion of T12 into the canal without significant spinal stenosis. Severe diffuse thoracic disc degeneration without significant spinal stenosis. MR LUMBAR SPINE IMPRESSION Negative for lumbar fracture. Multilevel degenerative change as above without significant spinal stenosis. Electronically Signed   By: Franchot Gallo M.D.   On: 08/19/2018 10:53   Mr Lumbar Spine Wo Contrast  Result Date: 08/19/2018 CLINICAL DATA:  Pre vertebroplasty. T12 fracture. Back pain 2 months EXAM: MRI THORACIC AND LUMBAR SPINE WITHOUT CONTRAST TECHNIQUE: Multiplanar and multiecho pulse sequences of the thoracic and lumbar spine were obtained without intravenous contrast. COMPARISON:  CT lumbar spine 08/18/2018 FINDINGS: MRI THORACIC SPINE FINDINGS Alignment: Mild anterolisthesis C7-T1, T2-3, T3-4.  Mild retrolisthesis T11-12 Vertebrae: Mild to moderate compression fracture T12 with diffuse bone marrow edema. Paraspinous edema bilaterally compatible with recent fracture. Mild retropulsion of the superior endplate of V95 into the canal without significant stenosis. Cord: Negative for cord compression. Normal cord signal without cord lesion. Paraspinal and other soft tissues: Paraspinous soft tissue swelling around the T12 fracture bilaterally. No mass lesion. Disc levels: Advanced disc degeneration throughout the entire lumbar spine with disc space narrowing and spurring diffusely. Arthrodesis at T7-8. No cord compression or significant spinal stenosis. Mild foraminal narrowing bilaterally at multiple levels due to spurring. MRI LUMBAR SPINE FINDINGS Segmentation:  Normal Alignment:  Mild retrolisthesis L1-2 and L2-3 Vertebrae: Mild to moderate fracture of T12 which appears acute or subacute. No lumbar fracture identified. Conus medullaris and cauda equina: Conus extends to the L1 level. Conus and cauda equina appear normal. Paraspinal and other soft tissues: Negative for paraspinous mass or edema Disc levels: L1-2: Mild disc degeneration with Schmorl's node L2-3: Disc degeneration with Schmorl's node. Mild disc bulging and endplate spurring without significant stenosis L2-3: Mild disc and moderate facet degeneration. Negative for stenosis L4-5: Diffuse bulging of the disc with moderate facet degeneration. Negative for stenosis L5-S1: Disc degeneration and spurring left greater than right with left foraminal encroachment. Bilateral facet hypertrophy. IMPRESSION: MR THORACIC SPINE IMPRESSION Mild to moderate compression fracture T12 with bone marrow edema and paraspinous soft tissue edema compatible with acute or subacute fracture. Mild retropulsion of T12 into the canal without significant spinal stenosis. Severe diffuse thoracic disc degeneration without significant spinal stenosis. MR LUMBAR SPINE IMPRESSION  Negative for lumbar fracture. Multilevel degenerative change as above without significant spinal stenosis. Electronically Signed   By: Franchot Gallo M.D.   On: 08/19/2018 10:53   Ct Abdomen Pelvis W Contrast  Result Date: 08/18/2018 CLINICAL DATA:  Right greater than left lower abdominal pain. EXAM: CT ABDOMEN AND PELVIS WITH CONTRAST TECHNIQUE: Multidetector CT imaging of the abdomen and pelvis was performed using the standard protocol following bolus administration of intravenous contrast. CONTRAST:  30mL OMNIPAQUE IOHEXOL 300 MG/ML  SOLN COMPARISON:  MRI abdomen dated March 26, 2018. CT abdomen pelvis dated January 15, 2015. FINDINGS: Lower chest: No acute abnormality. Hepatobiliary: Multiple hepatic simple cysts and small hemangiomas are unchanged. Unchanged small gallbladder polyp versus gallstone. No gallbladder wall thickening. Unchanged mild common bile duct and central intrahepatic biliary dilatation. Pancreas: Unremarkable. No pancreatic ductal dilatation or surrounding inflammatory changes. Spleen: Normal in size without focal abnormality. Adrenals/Urinary Tract: The adrenal glands are unremarkable. There is a 1.2 cm exophytic, high-attenuating lesion arising from the midpole of the right kidney, previously characterized as a hemorrhagic/proteinaceous cyst on prior MRI. Subcentimeter low-density lesions in both kidneys remain too small to characterize, but are unchanged. Slight interval enlargement of the nonobstructive calculi in the lower pole of  the left kidney and midpole of the right kidney. Decreased mild left hydronephrosis to the level of the UPJ. No right hydronephrosis. The bladder is unremarkable for the degree of distention. Stomach/Bowel: Stomach is within normal limits. Appendix is surgically absent. No evidence of bowel wall thickening, distention, or inflammatory changes. Moderate left-sided colonic diverticulosis. Large colonic stool burden. Vascular/Lymphatic: Aortic  atherosclerosis. No enlarged abdominal or pelvic lymph nodes. Reproductive: Status post hysterectomy. No adnexal masses. Other: No abdominal wall hernia or abnormality. No abdominopelvic ascites. No pneumoperitoneum. Musculoskeletal: No acute or significant osseous findings. Slight interval progression of the now moderate T12 compression deformity since November 2019. IMPRESSION: 1.  No acute intra-abdominal process. 2. Prominent stool throughout the colon. Correlate for constipation. 3. Slight interval increase in size of bilateral nonobstructive nephrolithiasis. 4. Slight interval progression of the now moderate T12 compression deformity since November 2019. Electronically Signed   By: Titus Dubin M.D.   On: 08/18/2018 15:22    Orson Eva, DO  [facility] Pager [phone]  If 7PM-7AM, please contact night-coverage [email or web address] Password [facility] 08/20/2018, 12:37 PM   LOS: 1 day

## 2018-08-21 LAB — CBC
HCT: 40.2 % (ref 36.0–46.0)
Hemoglobin: 12.9 g/dL (ref 12.0–15.0)
MCH: 29.1 pg (ref 26.0–34.0)
MCHC: 32.1 g/dL (ref 30.0–36.0)
MCV: 90.5 fL (ref 80.0–100.0)
Platelets: 266 10*3/uL (ref 150–400)
RBC: 4.44 MIL/uL (ref 3.87–5.11)
RDW: 13.7 % (ref 11.5–15.5)
WBC: 5.6 10*3/uL (ref 4.0–10.5)
nRBC: 0 % (ref 0.0–0.2)

## 2018-08-21 LAB — HEPARIN LEVEL (UNFRACTIONATED)
Heparin Unfractionated: 1.5 IU/mL — ABNORMAL HIGH (ref 0.30–0.70)
Heparin Unfractionated: 1.09 IU/mL — ABNORMAL HIGH (ref 0.30–0.70)

## 2018-08-21 LAB — PROTIME-INR
INR: 1.2 (ref 0.8–1.2)
Prothrombin Time: 15.5 seconds — ABNORMAL HIGH (ref 11.4–15.2)

## 2018-08-21 LAB — APTT: aPTT: >200 seconds (ref 24–36)

## 2018-08-21 MED ORDER — HEPARIN (PORCINE) 25000 UT/250ML-% IV SOLN
950.0000 [IU]/h | INTRAVENOUS | Status: DC
  Administered 2018-08-21: 950 [IU]/h via INTRAVENOUS
  Filled 2018-08-21: qty 250

## 2018-08-21 MED ORDER — HEPARIN BOLUS VIA INFUSION
2900.0000 [IU] | Freq: Once | INTRAVENOUS | Status: AC
  Administered 2018-08-21: 2900 [IU] via INTRAVENOUS
  Filled 2018-08-21: qty 2900

## 2018-08-21 NOTE — Progress Notes (Addendum)
ANTICOAGULATION CONSULT NOTE - Follow Up Consult  Pharmacy Consult for warfarin-->transistion to heparin for acute PE Indication: multiple ischemic CVAs /PE  Allergies  Allergen Reactions  . Epinephrine Anaphylaxis  . Demerol [Meperidine] Other (See Comments)    headaches  . Vicodin [Hydrocodone-Acetaminophen] Other (See Comments)    [name] it makes her crazy. Pt tolerates acetaminophen    Patient Measurements: Height: 5\' 2"  (157.5 cm) Weight: 134 lb 8 oz (61 kg) IBW/kg (Calculated) : 50.1  HEPARIN DW (KG): 59.7    Labs: Recent Labs    08/18/18 1205  08/19/18 0457 08/19/18 0845 08/20/18 0502 08/21/18 0607  HGB 14.6  --  13.8  --  13.2 12.9  HCT 45.5  --  43.8  --  39.9 40.2  PLT 300  --  285  --  278 266  LABPROT  --    < >  --  29.5* 26.4* 15.5*  INR  --    < >  --  2.9* 2.5* 1.2  CREATININE 1.12*  --  1.16*  --   --   --    < > = values in this interval not displayed.    Estimated Creatinine Clearance: 32.7 mL/min (A) (by C-G formula based on SCr of 1.16 mg/dL (H)).  Assessment: Pharmacy consulted to dose heparin infusion for this 83 yo female with acute PE. She has been on chronic anti-coagulation with warfarin 4mg  daily for history of multiple ischemic CVAs in the past. Patient was found to have a   T12 compression fracture and warfarin is now on hold for possible intervention in the near future.  Goal of Therapy:  Heparin level 0.3--0.7 IU/mL Monitor platelets by anticoagulation protocol: Yes   Plan: Give heparin bolus of 2900 units now (INR now 1.2) Start heparin infusion at  950 units/hr Obtain anti-Xa level in 6-8 hrs  CBC, INR and anti-Xa level daily   Pharmacy will monitor for for signs and symptoms of bleeding.  F/U transition back to oral anti-coagulant when appropriate.   Despina Pole, Pharm. D. Clinical Pharmacist 08/21/2018 12:12 PM

## 2018-08-21 NOTE — Progress Notes (Signed)
PROGRESS NOTE  [name] GEX:[id number] DOB: 08-Nov-1935 DOA: 08/18/2018 PCP: Celene Squibb, MD  Brief History: 83 year old female with a history of ischemic CVAs in the past resulting in warfarin anticoagulation, chronic abdominal pain, hypertension, hypothyroidism presenting with worsening back pain over 1 week. The patient states that she was at the grocery store getting a pack of bottled water. After lifting the water and transferring her to her grocery cart, the patient had immediate increase in her lower back pain. Fortunately, the patient was able to subsequently drive home, and she even transferred the package of water onto a dolly and back into her house. Over the next1 to 2 days, the patient related increasing lower back pain. She stated that in the past week she has visited her primary care provider twice. She stated that no radiographs were performed, but she was started on some type of muscle relaxer. She thinks that it may have been cyclobenzaprine. She stated that it did not help much. Because of increasing pain, this has limited the patient's ability to perform her usual ADLs. Fortunately, the patient is still able to make transfers and get to the bathroom albeit with significant pain and taking a significant amount of time. She denies any recent additional falls or trauma. She denies any saddle anesthesia, focal unilateral leg or arm weakness, bowel or bladder incontinence. She denies any fevers or chills. The patient states that she is only been using acetaminophen around-the-clock. She does not want to use any "strong pain medicine"that would cause her to have somnolence. She also complains of constipation with her last bowel movement on 08/17/2018. Normally she has a bowel movement almost daily. Because of worsening pain, the patient presented to emergency department for further evaluation. CT of the abdomen and pelvis showed slight interval  progression of her T12 compression deformity since 03/2018. The case was discussed with IR whom recommended MRI of the thoracic spine to see if the patient may be a candidate for kyphoplasty.  Assessment/Plan: Intractable back pain/T12 compression fracture -The patient wants to avoid"strong pain medicine" -continue tramadol -continue Robaxin -Acetaminophen around-the-clock -PT evaluation--SNF -MRI thoracic spine--Mild to moderate compression fracture T12 with bone marrow edema and paraspinous soft tissue edema compatible with acute or subacute fracture -start oxyIR as pt was able to tolerate IV morphine -case discussed with IR, Dr. Bradd Burner for kyphoplasty on 3/30  Constipation/chronic abdominal pain -08/18/2018 CT abdomen pelvis--unchanged hepatic cyst, unchangedGBpolyp versus stone with unchanged mild CBD and central intrahepatic biliary ductal dilatation;prominent colonic stool;decreased mild left hydronephrosis to the level of UPJ -Start MiraLAX daily -Start Senokot  History Ischemic CVAs -holding warfarin for now in anticipation should pt require intervention for T12 compression fracture -start IV heparin as INR now subtherapeutic  Essential hypertension -BP elevated in part due to pain -Continue amlodipine and metoprolol tartrate  Hypothyroidism -Continue Synthroid  CKD stage III -Baseline creatinine 0.9-1.1  Depression/anxiety -Continue Wellbutrin     Disposition Plan: SNFafter kyphoplasty Family Communication: Family at bedside  Consultants:none Code Status: FULL  DVT Prophylaxis:warfarin   Procedures: As Listed in Progress Note Above  Antibiotics: None    Subjective: Pt still has moderate to severe low back pain worsen with ambulation and weight bearing.  Denies worsen leg weakness, incontinence, saddle anesthesia.  Denies f/c, cp, sob, n/v/d.  +BM x3 last 24 hours  Objective: Vitals:   08/20/18 1959 08/20/18  2100 08/20/18 2221 08/21/18 0500  BP:  135/67 130/62  Pulse:  68 67   Resp:  18 16   Temp:  98.2 F (36.8 C) 97.7 F (36.5 C)   TempSrc:  Oral Oral   SpO2: 92% 99% 98%   Weight:    61 kg  Height:        Intake/Output Summary (Last 24 hours) at 08/21/2018 1328 Last data filed at 08/21/2018 1307 Gross per 24 hour  Intake 960 ml  Output --  Net 960 ml   Weight change: -4.891 kg Exam:   General:  Pt is alert, follows commands appropriately, not in acute distress  HEENT: No icterus, No thrush, No neck mass, [place]/AT  Cardiovascular: RRR, S1/S2, no rubs, no gallops  Respiratory: CTA bilaterally, no wheezing, no crackles, no rhonchi  Abdomen: Soft/+BS, non tender, non distended, no guarding  Extremities: No edema, No lymphangitis, No petechiae, No rashes, no synovitis   Data Reviewed: I have personally reviewed following labs and imaging studies Basic Metabolic Panel: Recent Labs  Lab 08/18/18 1205 08/19/18 0457  NA 141 143  K 4.3 4.2  CL 101 102  CO2 30 30  GLUCOSE 107* 85  BUN 24* 20  CREATININE 1.12* 1.16*  CALCIUM 9.4 9.1  MG  --  2.7*   Liver Function Tests: Recent Labs  Lab 08/18/18 1205  AST 33  ALT 26  ALKPHOS 89  BILITOT 0.6  PROT 7.3  ALBUMIN 4.4   No results for input(s): LIPASE, AMYLASE in the last 168 hours. No results for input(s): AMMONIA in the last 168 hours. Coagulation Profile: Recent Labs  Lab 08/18/18 1758 08/19/18 0845 08/20/18 0502 08/21/18 0607  INR 2.9* 2.9* 2.5* 1.2   CBC: Recent Labs  Lab 08/18/18 1205 08/19/18 0457 08/20/18 0502 08/21/18 0607  WBC 7.0 6.1 5.6 5.6  NEUTROABS 5.2  --   --   --   HGB 14.6 13.8 13.2 12.9  HCT 45.5 43.8 39.9 40.2  MCV 90.1 90.9 88.1 90.5  PLT 300 285 278 266   Cardiac Enzymes: No results for input(s): CKTOTAL, CKMB, CKMBINDEX, TROPONINI in the last 168 hours. BNP: Invalid input(s): POCBNP CBG: No results for input(s): GLUCAP in the last 168 hours. HbA1C: No results for  input(s): HGBA1C in the last 72 hours. Urine analysis:    Component Value Date/Time   COLORURINE STRAW (A) 08/18/2018 1139   APPEARANCEUR CLEAR 08/18/2018 1139   LABSPEC 1.005 08/18/2018 1139   PHURINE 8.0 08/18/2018 1139   GLUCOSEU NEGATIVE 08/18/2018 1139   HGBUR NEGATIVE 08/18/2018 1139   BILIRUBINUR NEGATIVE 08/18/2018 1139   KETONESUR NEGATIVE 08/18/2018 1139   PROTEINUR NEGATIVE 08/18/2018 1139   NITRITE NEGATIVE 08/18/2018 1139   LEUKOCYTESUR TRACE (A) 08/18/2018 1139   Sepsis Labs: @LABRCNTIP (procalcitonin:4,lacticidven:4) )No results found for this or any previous visit (from the past 240 hour(s)).   Scheduled Meds:  amLODipine  5 mg Oral Daily   buPROPion  150 mg Oral Daily   cholecalciferol  1,000 Units Oral Daily   ezetimibe  10 mg Oral Daily   ferrous sulfate  325 mg Oral Daily   furosemide  40 mg Oral Daily   levothyroxine  50 mcg Oral Q0600   methocarbamol  500 mg Oral TID   metoprolol tartrate  12.5 mg Oral BID   polyethylene glycol  17 g Oral Daily   senna  2 tablet Oral Daily   traMADol  50 mg Oral Q12H   vitamin C  1,000 mg Oral Daily   Warfarin - Pharmacist Dosing Inpatient  Does not apply q1800   Continuous Infusions:  heparin 950 Units/hr (08/21/18 1245)    Procedures/Studies: Dg Thoracic Spine W/swimmers  Result Date: 08/18/2018 CLINICAL DATA:  Thoracic spine pain since lifting water a week ago. EXAM: THORACIC SPINE - 3 VIEWS COMPARISON:  CT chest 09/20/2012 and CT abdomen and pelvis 01/15/2015. FINDINGS: S shaped thoracolumbar scoliosis is identified. Remote T11 and T12 compression fractures are noted. No acute fracture. Mild multilevel degenerative change noted. IMPRESSION: No acute finding. S shaped scoliosis. Mild, remote T11 and T12 compression fractures. Electronically Signed   By: Inge Rise M.D.   On: 08/18/2018 10:34   Dg Lumbar Spine Complete  Result Date: 08/18/2018 CLINICAL DATA:  Low back pain for 1 week  radiating into both legs after lifting water. EXAM: LUMBAR SPINE - COMPLETE 4+ VIEW COMPARISON:  CT abdomen and pelvis 01/15/2015. FINDINGS: Convex left scoliosis is unchanged. The patient has remote T12 superior endplate and N23 inferior endplate compression fractures which are unchanged. Lower lumbar facet arthropathy is noted. Mild loss of disc space height is seen at L5-S1. Paraspinous structures demonstrate a stone in the lower pole of the left kidney, unchanged. Massive volume of stool throughout the colon is seen. IMPRESSION: No acute abnormality or change compared to the prior exam. Mild, remote T11 and T12 compression fractures. Lower lumbar spondylosis. Nonobstructing stone left kidney, unchanged. Very large volume of stool throughout the colon. Electronically Signed   By: Inge Rise M.D.   On: 08/18/2018 10:30   Mr Thoracic Spine Wo Contrast  Result Date: 08/19/2018 CLINICAL DATA:  Pre vertebroplasty. T12 fracture. Back pain 2 months EXAM: MRI THORACIC AND LUMBAR SPINE WITHOUT CONTRAST TECHNIQUE: Multiplanar and multiecho pulse sequences of the thoracic and lumbar spine were obtained without intravenous contrast. COMPARISON:  CT lumbar spine 08/18/2018 FINDINGS: MRI THORACIC SPINE FINDINGS Alignment: Mild anterolisthesis C7-T1, T2-3, T3-4. Mild retrolisthesis T11-12 Vertebrae: Mild to moderate compression fracture T12 with diffuse bone marrow edema. Paraspinous edema bilaterally compatible with recent fracture. Mild retropulsion of the superior endplate of F57 into the canal without significant stenosis. Cord: Negative for cord compression. Normal cord signal without cord lesion. Paraspinal and other soft tissues: Paraspinous soft tissue swelling around the T12 fracture bilaterally. No mass lesion. Disc levels: Advanced disc degeneration throughout the entire lumbar spine with disc space narrowing and spurring diffusely. Arthrodesis at T7-8. No cord compression or significant spinal stenosis.  Mild foraminal narrowing bilaterally at multiple levels due to spurring. MRI LUMBAR SPINE FINDINGS Segmentation:  Normal Alignment:  Mild retrolisthesis L1-2 and L2-3 Vertebrae: Mild to moderate fracture of T12 which appears acute or subacute. No lumbar fracture identified. Conus medullaris and cauda equina: Conus extends to the L1 level. Conus and cauda equina appear normal. Paraspinal and other soft tissues: Negative for paraspinous mass or edema Disc levels: L1-2: Mild disc degeneration with Schmorl's node L2-3: Disc degeneration with Schmorl's node. Mild disc bulging and endplate spurring without significant stenosis L2-3: Mild disc and moderate facet degeneration. Negative for stenosis L4-5: Diffuse bulging of the disc with moderate facet degeneration. Negative for stenosis L5-S1: Disc degeneration and spurring left greater than right with left foraminal encroachment. Bilateral facet hypertrophy. IMPRESSION: MR THORACIC SPINE IMPRESSION Mild to moderate compression fracture T12 with bone marrow edema and paraspinous soft tissue edema compatible with acute or subacute fracture. Mild retropulsion of T12 into the canal without significant spinal stenosis. Severe diffuse thoracic disc degeneration without significant spinal stenosis. MR LUMBAR SPINE IMPRESSION Negative for lumbar fracture. Multilevel degenerative change  as above without significant spinal stenosis. Electronically Signed   By: Franchot Gallo M.D.   On: 08/19/2018 10:53   Mr Lumbar Spine Wo Contrast  Result Date: 08/19/2018 CLINICAL DATA:  Pre vertebroplasty. T12 fracture. Back pain 2 months EXAM: MRI THORACIC AND LUMBAR SPINE WITHOUT CONTRAST TECHNIQUE: Multiplanar and multiecho pulse sequences of the thoracic and lumbar spine were obtained without intravenous contrast. COMPARISON:  CT lumbar spine 08/18/2018 FINDINGS: MRI THORACIC SPINE FINDINGS Alignment: Mild anterolisthesis C7-T1, T2-3, T3-4. Mild retrolisthesis T11-12 Vertebrae: Mild to  moderate compression fracture T12 with diffuse bone marrow edema. Paraspinous edema bilaterally compatible with recent fracture. Mild retropulsion of the superior endplate of V78 into the canal without significant stenosis. Cord: Negative for cord compression. Normal cord signal without cord lesion. Paraspinal and other soft tissues: Paraspinous soft tissue swelling around the T12 fracture bilaterally. No mass lesion. Disc levels: Advanced disc degeneration throughout the entire lumbar spine with disc space narrowing and spurring diffusely. Arthrodesis at T7-8. No cord compression or significant spinal stenosis. Mild foraminal narrowing bilaterally at multiple levels due to spurring. MRI LUMBAR SPINE FINDINGS Segmentation:  Normal Alignment:  Mild retrolisthesis L1-2 and L2-3 Vertebrae: Mild to moderate fracture of T12 which appears acute or subacute. No lumbar fracture identified. Conus medullaris and cauda equina: Conus extends to the L1 level. Conus and cauda equina appear normal. Paraspinal and other soft tissues: Negative for paraspinous mass or edema Disc levels: L1-2: Mild disc degeneration with Schmorl's node L2-3: Disc degeneration with Schmorl's node. Mild disc bulging and endplate spurring without significant stenosis L2-3: Mild disc and moderate facet degeneration. Negative for stenosis L4-5: Diffuse bulging of the disc with moderate facet degeneration. Negative for stenosis L5-S1: Disc degeneration and spurring left greater than right with left foraminal encroachment. Bilateral facet hypertrophy. IMPRESSION: MR THORACIC SPINE IMPRESSION Mild to moderate compression fracture T12 with bone marrow edema and paraspinous soft tissue edema compatible with acute or subacute fracture. Mild retropulsion of T12 into the canal without significant spinal stenosis. Severe diffuse thoracic disc degeneration without significant spinal stenosis. MR LUMBAR SPINE IMPRESSION Negative for lumbar fracture. Multilevel  degenerative change as above without significant spinal stenosis. Electronically Signed   By: Franchot Gallo M.D.   On: 08/19/2018 10:53   Ct Abdomen Pelvis W Contrast  Result Date: 08/18/2018 CLINICAL DATA:  Right greater than left lower abdominal pain. EXAM: CT ABDOMEN AND PELVIS WITH CONTRAST TECHNIQUE: Multidetector CT imaging of the abdomen and pelvis was performed using the standard protocol following bolus administration of intravenous contrast. CONTRAST:  85mL OMNIPAQUE IOHEXOL 300 MG/ML  SOLN COMPARISON:  MRI abdomen dated March 26, 2018. CT abdomen pelvis dated January 15, 2015. FINDINGS: Lower chest: No acute abnormality. Hepatobiliary: Multiple hepatic simple cysts and small hemangiomas are unchanged. Unchanged small gallbladder polyp versus gallstone. No gallbladder wall thickening. Unchanged mild common bile duct and central intrahepatic biliary dilatation. Pancreas: Unremarkable. No pancreatic ductal dilatation or surrounding inflammatory changes. Spleen: Normal in size without focal abnormality. Adrenals/Urinary Tract: The adrenal glands are unremarkable. There is a 1.2 cm exophytic, high-attenuating lesion arising from the midpole of the right kidney, previously characterized as a hemorrhagic/proteinaceous cyst on prior MRI. Subcentimeter low-density lesions in both kidneys remain too small to characterize, but are unchanged. Slight interval enlargement of the nonobstructive calculi in the lower pole of the left kidney and midpole of the right kidney. Decreased mild left hydronephrosis to the level of the UPJ. No right hydronephrosis. The bladder is unremarkable for the degree of distention.  Stomach/Bowel: Stomach is within normal limits. Appendix is surgically absent. No evidence of bowel wall thickening, distention, or inflammatory changes. Moderate left-sided colonic diverticulosis. Large colonic stool burden. Vascular/Lymphatic: Aortic atherosclerosis. No enlarged abdominal or pelvic lymph  nodes. Reproductive: Status post hysterectomy. No adnexal masses. Other: No abdominal wall hernia or abnormality. No abdominopelvic ascites. No pneumoperitoneum. Musculoskeletal: No acute or significant osseous findings. Slight interval progression of the now moderate T12 compression deformity since November 2019. IMPRESSION: 1.  No acute intra-abdominal process. 2. Prominent stool throughout the colon. Correlate for constipation. 3. Slight interval increase in size of bilateral nonobstructive nephrolithiasis. 4. Slight interval progression of the now moderate T12 compression deformity since November 2019. Electronically Signed   By: Titus Dubin M.D.   On: 08/18/2018 15:22    Orson Eva, DO  [facility] Pager [phone]  If 7PM-7AM, please contact night-coverage [email or web address] Password [email or web address] 08/21/2018, 1:28 PM   LOS: 2 days

## 2018-08-21 NOTE — Progress Notes (Addendum)
CRITICAL VALUE ALERT  Critical Value:  APTT >200  Date & Time Notied:  08/21/18 2:09 PM  Provider Notified: Dr. Carles Collet  Orders Received/Actions taken: Spoke with pharmacy, APTT will be redrawn tonight.

## 2018-08-21 NOTE — Progress Notes (Signed)
Physical Therapy Treatment Patient Details Name: [name] MRN: [id number] DOB: [birth year] Today's Date: 08/21/2018    History of Present Illness [name] is a 83 y.o. female with medical history significant for multiple acute ischemic CVAs prompting chronic anticoagulation on warfarin, chronic abdominal pain, acquired hypothyroidism, hypertension, chronic diastolic heart failure, who is admitted to [facility] on 08/18/2018 for pain control in setting of progression of T12 compression fracture at presenting from home to [facility] ED complaining of low back pain.     PT Comments    Patient instructed in core strengthening lifting legs and arms while supine in bed with good return demonstrated and demonstrates slow labored movement for log rolling to side and sitting up from side lying position using bed rail.  Patient frequently looks down while seated at bedside, requires verbal/tactile cuing to extend trunk while completing BLE ROM/strengthening exercises and limited for gait training due to frequent episodes of spasms with sharp right sided pain in mid back radiating around rib cage resulting in buckling of knees with near falls.  Patient tolerated sitting up in chair after therapy.  Patient will benefit from continued physical therapy in hospital and recommended venue below to increase strength, balance, endurance for safe ADLs and gait.   Follow Up Recommendations  SNF;Supervision - Intermittent     Equipment Recommendations  Rolling walker with 5" wheels;Other (comment)    Recommendations for Other Services       Precautions / Restrictions Precautions Precautions: Fall Precaution Comments: T12 compression fx Restrictions Weight Bearing Restrictions: No    Mobility  Bed Mobility Overal bed mobility: Needs Assistance Bed Mobility: Rolling;Sidelying to Sit Rolling: Supervision Sidelying to sit: Min guard       General bed mobility comments: had  to use siderail during log rolling/supine to sitting with slow labored movement  Transfers Overall transfer level: Needs assistance   Transfers: Sit to/from Stand;Stand Pivot Transfers Sit to Stand: Min guard Stand pivot transfers: Min guard       General transfer comment: increased right sided back pain  Ambulation/Gait Ambulation/Gait assistance: Min guard;Min assist Gait Distance (Feet): 40 Feet Assistive device: Rolling walker (2 wheeled) Gait Pattern/deviations: Decreased step length - right;Decreased stride length;Decreased step length - left Gait velocity: decreased   General Gait Details: slow labored cadence with frequent stopping due to sharp right sided mid back pain with radiation around rib cage, frequent buckling of knees   Stairs             Wheelchair Mobility    Modified Rankin (Stroke Patients Only)       Balance Overall balance assessment: Needs assistance Sitting-balance support: No upper extremity supported;Feet supported Sitting balance-Leahy Scale: Fair     Standing balance support: During functional activity;Bilateral upper extremity supported Standing balance-Leahy Scale: Fair Standing balance comment: using RW                            Cognition Arousal/Alertness: Awake/alert Behavior During Therapy: WFL for tasks assessed/performed Overall Cognitive Status: Within Functional Limits for tasks assessed                                        Exercises General Exercises - Lower Extremity Long Arc Quad: Seated;Strengthening;AROM;Both;10 reps Hip Flexion/Marching: Seated;Strengthening;Both;10 reps;AROM Toe Raises: Seated;Strengthening;AROM;Both;10 reps Heel Raises: Seated;AROM;Strengthening;Both;10 reps    General  Comments        Pertinent Vitals/Pain Pain Score: 6  Pain Location: Back pain more on the right side radiating around rib cage Pain Descriptors / Indicators: Sharp;Spasm Pain  Intervention(s): Limited activity within patient's tolerance;Monitored during session;Repositioned;Premedicated before session    Home Living                      Prior Function            PT Goals (current goals can now be found in the care plan section) Acute Rehab PT Goals Patient Stated Goal: return home after rehab PT Goal Formulation: With patient Time For Goal Achievement: 09/02/18 Potential to Achieve Goals: Good Progress towards PT goals: Progressing toward goals    Frequency    Min 3X/week      PT Plan Current plan remains appropriate    Co-evaluation              AM-PAC PT "6 Clicks" Mobility   Outcome Measure  Help needed turning from your back to your side while in a flat bed without using bedrails?: A Little Help needed moving from lying on your back to sitting on the side of a flat bed without using bedrails?: A Lot(had to use bedrail) Help needed moving to and from a bed to a chair (including a wheelchair)?: A Little Help needed standing up from a chair using your arms (e.g., wheelchair or bedside chair)?: A Little Help needed to walk in hospital room?: A Little Help needed climbing 3-5 steps with a railing? : A Lot 6 Click Score: 16    End of Session Equipment Utilized During Treatment: Gait belt Activity Tolerance: Patient tolerated treatment well;Patient limited by fatigue;Patient limited by pain Patient left: in chair;with call bell/phone within reach Nurse Communication: Mobility status PT Visit Diagnosis: Unsteadiness on feet (R26.81);Other abnormalities of gait and mobility (R26.89);Muscle weakness (generalized) (M62.81)     Time: 5462-7035 PT Time Calculation (min) (ACUTE ONLY): 37 min  Charges:  $Therapeutic Exercise: 8-22 mins $Therapeutic Activity: 8-22 mins                     12:43 PM, 08/21/18 Lonell Grandchild, MPT Physical Therapist with [facility] [phone] office [phone] mobile phone

## 2018-08-22 DIAGNOSIS — S22080G Wedge compression fracture of T11-T12 vertebra, subsequent encounter for fracture with delayed healing: Secondary | ICD-10-CM

## 2018-08-22 LAB — CBC
HCT: 39.6 % (ref 36.0–46.0)
Hemoglobin: 12.6 g/dL (ref 12.0–15.0)
MCH: 29.1 pg (ref 26.0–34.0)
MCHC: 31.8 g/dL (ref 30.0–36.0)
MCV: 91.5 fL (ref 80.0–100.0)
Platelets: 284 10*3/uL (ref 150–400)
RBC: 4.33 MIL/uL (ref 3.87–5.11)
RDW: 13.5 % (ref 11.5–15.5)
WBC: 6.7 10*3/uL (ref 4.0–10.5)
nRBC: 0 % (ref 0.0–0.2)

## 2018-08-22 LAB — PROTIME-INR
INR: 1.2 (ref 0.8–1.2)
Prothrombin Time: 15.1 seconds (ref 11.4–15.2)

## 2018-08-22 LAB — HEPARIN LEVEL (UNFRACTIONATED)
Heparin Unfractionated: 1.26 IU/mL — ABNORMAL HIGH (ref 0.30–0.70)
Heparin Unfractionated: 0.57 IU/mL (ref 0.30–0.70)

## 2018-08-22 MED ORDER — HEPARIN (PORCINE) 25000 UT/250ML-% IV SOLN
500.0000 [IU]/h | INTRAVENOUS | Status: DC
  Administered 2018-08-22 (×2): 500 [IU]/h via INTRAVENOUS
  Filled 2018-08-22: qty 250

## 2018-08-22 MED ORDER — HEPARIN (PORCINE) 25000 UT/250ML-% IV SOLN
750.0000 [IU]/h | INTRAVENOUS | Status: DC
  Administered 2018-08-22: 750 [IU]/h via INTRAVENOUS

## 2018-08-22 NOTE — Progress Notes (Signed)
Physical Therapy Treatment Patient Details Name: [name] MRN: [id number] DOB: [birth year] Today's Date: 08/22/2018    History of Present Illness [name] is a 83 y.o. female with medical history significant for multiple acute ischemic CVAs prompting chronic anticoagulation on warfarin, chronic abdominal pain, acquired hypothyroidism, hypertension, chronic diastolic heart failure, who is admitted to [facility] on 08/18/2018 for pain control in setting of progression of T12 compression fracture at presenting from home to [facility] ED complaining of low back pain.     PT Comments    Patient tolerated standing in front of sink to brush teeth supporting self with 1 hand, continues to have difficulty with bed mobility during supine<>sitting due to increased right sided low back pain with spasms and radiation to bilateral side of rib cage.  Patient has to take occasional stops during gait training with guarding of right hip and low back with minor buckling of right knee.   Patient tolerated sitting up in chair after therapy.  Patient will benefit from continued physical therapy in hospital and recommended venue below to increase strength, balance, endurance for safe ADLs and gait.   Follow Up Recommendations  SNF;Supervision - Intermittent     Equipment Recommendations  Rolling walker with 5" wheels    Recommendations for Other Services       Precautions / Restrictions Precautions Precautions: Fall Precaution Comments: T12 compression fx Restrictions Weight Bearing Restrictions: No    Mobility  Bed Mobility Overal bed mobility: Needs Assistance Bed Mobility: Rolling;Sidelying to Sit   Sidelying to sit: Min guard     Sit to sidelying: Min assist General bed mobility comments: requires use of siderail and assistance to move BLE when getting into/out of bed  Transfers Overall transfer level: Needs assistance Equipment used: Rolling walker (2  wheeled) Transfers: Sit to/from [facility] Sit to Stand: Supervision Stand pivot transfers: Min guard       General transfer comment: increased time, c/o increased right hip/right sided back pain during stand to sitting when flexing trunk  Ambulation/Gait Ambulation/Gait assistance: Min guard;Min assist Gait Distance (Feet): 35 Feet Assistive device: Rolling walker (2 wheeled) Gait Pattern/deviations: Decreased step length - right;Decreased step length - left;Decreased stride length Gait velocity: decreased   General Gait Details: slow labored cadence with occasional stopping with guarding of right side of low back due to pain with radiation to right hip, occasional buckling of righ knee   Stairs             Wheelchair Mobility    Modified Rankin (Stroke Patients Only)       Balance Overall balance assessment: Needs assistance Sitting-balance support: Feet supported;No upper extremity supported Sitting balance-Leahy Scale: Fair Sitting balance - Comments: fair/good when supporting self with BUE   Standing balance support: During functional activity;Bilateral upper extremity supported Standing balance-Leahy Scale: Fair Standing balance comment: using RW                            Cognition Arousal/Alertness: Awake/alert Behavior During Therapy: WFL for tasks assessed/performed Overall Cognitive Status: Within Functional Limits for tasks assessed                                        Exercises General Exercises - Lower Extremity Long Arc Quad: Seated;Strengthening;AROM;Both;10 reps Hip Flexion/Marching: Seated;Strengthening;Both;10 reps;AROM Toe Raises: Seated;Strengthening;AROM;Both;10 reps  Heel Raises: Seated;AROM;Strengthening;Both;10 reps Other Exercises Other Exercises: supine trunk core strength - leg lifts in hooklying position, abdominal sets x 10 reps each    General Comments        Pertinent  Vitals/Pain Pain Assessment: Faces Faces Pain Scale: Hurts even more Pain Location: Back pain more on the right side radiating around bilateral rib cage at T12 level Pain Descriptors / Indicators: Sharp;Spasm Pain Intervention(s): Limited activity within patient's tolerance;Monitored during session;Premedicated before session    Home Living Family/patient expects to be discharged to:: Private residence Living Arrangements: Alone                  Prior Function            PT Goals (current goals can now be found in the care plan section) Acute Rehab PT Goals Patient Stated Goal: return home after rehab PT Goal Formulation: With patient Time For Goal Achievement: 09/02/18 Potential to Achieve Goals: Good Progress towards PT goals: Progressing toward goals    Frequency    Min 3X/week      PT Plan Current plan remains appropriate    Co-evaluation              AM-PAC PT "6 Clicks" Mobility   Outcome Measure  Help needed turning from your back to your side while in a flat bed without using bedrails?: A Little Help needed moving from lying on your back to sitting on the side of a flat bed without using bedrails?: A Little Help needed moving to and from a bed to a chair (including a wheelchair)?: A Little Help needed standing up from a chair using your arms (e.g., wheelchair or bedside chair)?: A Little Help needed to walk in hospital room?: A Lot Help needed climbing 3-5 steps with a railing? : A Lot 6 Click Score: 16    End of Session Equipment Utilized During Treatment: Gait belt Activity Tolerance: Patient tolerated treatment well;Patient limited by fatigue;Patient limited by pain Patient left: in chair;with call bell/phone within reach Nurse Communication: Mobility status PT Visit Diagnosis: Unsteadiness on feet (R26.81);Other abnormalities of gait and mobility (R26.89);Muscle weakness (generalized) (M62.81)     Time: 4696-2952 PT Time Calculation  (min) (ACUTE ONLY): 36 min  Charges:  $Therapeutic Exercise: 8-22 mins $Therapeutic Activity: 8-22 mins                     11:06 AM, 08/22/18 Lonell Grandchild, MPT Physical Therapist with [facility] [phone] office [phone] mobile phone

## 2018-08-22 NOTE — Progress Notes (Signed)
PROGRESS NOTE  [name] ION:[id number] DOB: 17-May-1936 DOA: 08/18/2018 PCP: Celene Squibb, MD  Brief History: 83 year old female with a history of ischemic CVAs in the past resulting in warfarin anticoagulation, chronic abdominal pain, hypertension, hypothyroidism presenting with worsening back pain over 1 week. The patient states that she was at the grocery store getting a pack of bottled water. After lifting the water and transferring her to her grocery cart, the patient had immediate increase in her lower back pain. Fortunately, the patient was able to subsequently drive home, and she even transferred the package of water onto a dolly and back into her house. Over the next1 to 2 days, the patient related increasing lower back pain. She stated that in the past week she has visited her primary care provider twice. She stated that no radiographs were performed, but she was started on some type of muscle relaxer. She thinks that it may have been cyclobenzaprine. She stated that it did not help much. Because of increasing pain, this has limited the patient's ability to perform her usual ADLs. Fortunately, the patient is still able to make transfers and get to the bathroom albeit with significant pain and taking a significant amount of time. She denies any recent additional falls or trauma. She denies any saddle anesthesia, focal unilateral leg or arm weakness, bowel or bladder incontinence. She denies any fevers or chills. The patient states that she is only been using acetaminophen around-the-clock. She does not want to use any "strong pain medicine"that would cause her to have somnolence. She also complains of constipation with her last bowel movement on 08/17/2018. Normally she has a bowel movement almost daily. Because of worsening pain, the patient presented to emergency department for further evaluation. CT of the abdomen and pelvis showed slight interval  progression of her T12 compression deformity since 03/2018. The case was discussed with IR whom recommended MRI of the thoracic spine to see if the patient may be a candidate for kyphoplasty.  Assessment/Plan: Intractable back pain/T12 compression fracture -The patient wants to avoid"strong pain medicine" -continuetramadol -continueRobaxin -Acetaminophen around-the-clock -PT evaluation--SNF -MRI thoracic spine--Mild to moderate compression fracture T12 with bone marrow edema and paraspinous soft tissue edema compatible with acute or subacute fracture -continue oxy IR for breakthrough pain -case discussed with IR, Dr. Bradd Burner for kyphoplasty on 3/30 -holding IV heparin after MN -restart warfarin when ok with IR after procedure  Constipation/chronic abdominal pain -08/18/2018 CT abdomen pelvis--unchanged hepatic cyst, unchangedGBpolyp versus stone with unchanged mild CBD and central intrahepatic biliary ductal dilatation;prominent colonic stool;decreased mild left hydronephrosis to the level of UPJ -Started MiraLAX daily -Started Senokot  History Ischemic CVAs -holding warfarin for now in anticipation should pt require intervention for T12 compression fracture -started IV heparin as INR now subtherapeutic  Essential hypertension -BP elevated in part due to pain -Continue amlodipine and metoprolol tartrate  Hypothyroidism -Continue Synthroid  CKD stage III -Baseline creatinine 0.9-1.1  Depression/anxiety -Continue Wellbutrin     Disposition Plan: SNFafter kyphoplasty Family Communication: Family at bedside  Consultants:none Code Status: FULL  DVT Prophylaxis:warfarin   Procedures: As Listed in Progress Note Above  Antibiotics: None   Subjective: Pt continues to complain of pain in lower back moderate to severe with intermittent muscle spasms.  Pain is worse with activity and weight bearing.  She denies worsening leg  weakness.  No f/c, cp, sob, n/v/d. +BM  Objective: Vitals:   08/21/18 1418 08/21/18 2135  08/22/18 0500 08/22/18 0741  BP: 123/66 (!) 153/87  (!) 161/74  Pulse: 68 70  (!) 57  Resp: 17 18  18   Temp: 98 F (36.7 C) 97.9 F (36.6 C)  97.8 F (36.6 C)  TempSrc: Oral Oral  Oral  SpO2: 98% 100%  99%  Weight:   62.7 kg   Height:        Intake/Output Summary (Last 24 hours) at 08/22/2018 1028 Last data filed at 08/22/2018 0700 Gross per 24 hour  Intake 1440 ml  Output --  Net 1440 ml   Weight change: 1.691 kg Exam:   General:  Pt is alert, follows commands appropriately, not in acute distress  HEENT: No icterus, No thrush, No neck mass, [place]/AT  Cardiovascular: RRR, S1/S2, no rubs, no gallops  Respiratory: CTA bilaterally, no wheezing, no crackles, no rhonchi  Abdomen: Soft/+BS, non tender, non distended, no guarding  Extremities: No edema, No lymphangitis, No petechiae, No rashes, no synovitis   Data Reviewed: I have personally reviewed following labs and imaging studies Basic Metabolic Panel: Recent Labs  Lab 08/18/18 1205 08/19/18 0457  NA 141 143  K 4.3 4.2  CL 101 102  CO2 30 30  GLUCOSE 107* 85  BUN 24* 20  CREATININE 1.12* 1.16*  CALCIUM 9.4 9.1  MG  --  2.7*   Liver Function Tests: Recent Labs  Lab 08/18/18 1205  AST 33  ALT 26  ALKPHOS 89  BILITOT 0.6  PROT 7.3  ALBUMIN 4.4   No results for input(s): LIPASE, AMYLASE in the last 168 hours. No results for input(s): AMMONIA in the last 168 hours. Coagulation Profile: Recent Labs  Lab 08/18/18 1758 08/19/18 0845 08/20/18 0502 08/21/18 0607 08/22/18 0447  INR 2.9* 2.9* 2.5* 1.2 1.2   CBC: Recent Labs  Lab 08/18/18 1205 08/19/18 0457 08/20/18 0502 08/21/18 0607 08/22/18 0447  WBC 7.0 6.1 5.6 5.6 6.7  NEUTROABS 5.2  --   --   --   --   HGB 14.6 13.8 13.2 12.9 12.6  HCT 45.5 43.8 39.9 40.2 39.6  MCV 90.1 90.9 88.1 90.5 91.5  PLT 300 285 278 266 284   Cardiac Enzymes: No results  for input(s): CKTOTAL, CKMB, CKMBINDEX, TROPONINI in the last 168 hours. BNP: Invalid input(s): POCBNP CBG: No results for input(s): GLUCAP in the last 168 hours. HbA1C: No results for input(s): HGBA1C in the last 72 hours. Urine analysis:    Component Value Date/Time   COLORURINE STRAW (A) 08/18/2018 1139   APPEARANCEUR CLEAR 08/18/2018 1139   LABSPEC 1.005 08/18/2018 1139   PHURINE 8.0 08/18/2018 1139   GLUCOSEU NEGATIVE 08/18/2018 1139   HGBUR NEGATIVE 08/18/2018 1139   BILIRUBINUR NEGATIVE 08/18/2018 1139   KETONESUR NEGATIVE 08/18/2018 1139   PROTEINUR NEGATIVE 08/18/2018 1139   NITRITE NEGATIVE 08/18/2018 1139   LEUKOCYTESUR TRACE (A) 08/18/2018 1139   Sepsis Labs: @LABRCNTIP (procalcitonin:4,lacticidven:4) )No results found for this or any previous visit (from the past 240 hour(s)).   Scheduled Meds:  amLODipine  5 mg Oral Daily   buPROPion  150 mg Oral Daily   cholecalciferol  1,000 Units Oral Daily   ezetimibe  10 mg Oral Daily   ferrous sulfate  325 mg Oral Daily   furosemide  40 mg Oral Daily   levothyroxine  50 mcg Oral Q0600   methocarbamol  500 mg Oral TID   metoprolol tartrate  12.5 mg Oral BID   polyethylene glycol  17 g Oral Daily   senna  2 tablet Oral Daily   traMADol  50 mg Oral Q12H   vitamin C  1,000 mg Oral Daily   Warfarin - Pharmacist Dosing Inpatient   Does not apply q1800   Continuous Infusions:  heparin 750 Units/hr (08/22/18 0116)    Procedures/Studies: Dg Thoracic Spine W/swimmers  Result Date: 08/18/2018 CLINICAL DATA:  Thoracic spine pain since lifting water a week ago. EXAM: THORACIC SPINE - 3 VIEWS COMPARISON:  CT chest 09/20/2012 and CT abdomen and pelvis 01/15/2015. FINDINGS: S shaped thoracolumbar scoliosis is identified. Remote T11 and T12 compression fractures are noted. No acute fracture. Mild multilevel degenerative change noted. IMPRESSION: No acute finding. S shaped scoliosis. Mild, remote T11 and T12  compression fractures. Electronically Signed   By: Inge Rise M.D.   On: 08/18/2018 10:34   Dg Lumbar Spine Complete  Result Date: 08/18/2018 CLINICAL DATA:  Low back pain for 1 week radiating into both legs after lifting water. EXAM: LUMBAR SPINE - COMPLETE 4+ VIEW COMPARISON:  CT abdomen and pelvis 01/15/2015. FINDINGS: Convex left scoliosis is unchanged. The patient has remote T12 superior endplate and G95 inferior endplate compression fractures which are unchanged. Lower lumbar facet arthropathy is noted. Mild loss of disc space height is seen at L5-S1. Paraspinous structures demonstrate a stone in the lower pole of the left kidney, unchanged. Massive volume of stool throughout the colon is seen. IMPRESSION: No acute abnormality or change compared to the prior exam. Mild, remote T11 and T12 compression fractures. Lower lumbar spondylosis. Nonobstructing stone left kidney, unchanged. Very large volume of stool throughout the colon. Electronically Signed   By: Inge Rise M.D.   On: 08/18/2018 10:30   Mr Thoracic Spine Wo Contrast  Result Date: 08/19/2018 CLINICAL DATA:  Pre vertebroplasty. T12 fracture. Back pain 2 months EXAM: MRI THORACIC AND LUMBAR SPINE WITHOUT CONTRAST TECHNIQUE: Multiplanar and multiecho pulse sequences of the thoracic and lumbar spine were obtained without intravenous contrast. COMPARISON:  CT lumbar spine 08/18/2018 FINDINGS: MRI THORACIC SPINE FINDINGS Alignment: Mild anterolisthesis C7-T1, T2-3, T3-4. Mild retrolisthesis T11-12 Vertebrae: Mild to moderate compression fracture T12 with diffuse bone marrow edema. Paraspinous edema bilaterally compatible with recent fracture. Mild retropulsion of the superior endplate of A21 into the canal without significant stenosis. Cord: Negative for cord compression. Normal cord signal without cord lesion. Paraspinal and other soft tissues: Paraspinous soft tissue swelling around the T12 fracture bilaterally. No mass lesion. Disc  levels: Advanced disc degeneration throughout the entire lumbar spine with disc space narrowing and spurring diffusely. Arthrodesis at T7-8. No cord compression or significant spinal stenosis. Mild foraminal narrowing bilaterally at multiple levels due to spurring. MRI LUMBAR SPINE FINDINGS Segmentation:  Normal Alignment:  Mild retrolisthesis L1-2 and L2-3 Vertebrae: Mild to moderate fracture of T12 which appears acute or subacute. No lumbar fracture identified. Conus medullaris and cauda equina: Conus extends to the L1 level. Conus and cauda equina appear normal. Paraspinal and other soft tissues: Negative for paraspinous mass or edema Disc levels: L1-2: Mild disc degeneration with Schmorl's node L2-3: Disc degeneration with Schmorl's node. Mild disc bulging and endplate spurring without significant stenosis L2-3: Mild disc and moderate facet degeneration. Negative for stenosis L4-5: Diffuse bulging of the disc with moderate facet degeneration. Negative for stenosis L5-S1: Disc degeneration and spurring left greater than right with left foraminal encroachment. Bilateral facet hypertrophy. IMPRESSION: MR THORACIC SPINE IMPRESSION Mild to moderate compression fracture T12 with bone marrow edema and paraspinous soft tissue edema compatible with acute or subacute fracture. Mild  retropulsion of T12 into the canal without significant spinal stenosis. Severe diffuse thoracic disc degeneration without significant spinal stenosis. MR LUMBAR SPINE IMPRESSION Negative for lumbar fracture. Multilevel degenerative change as above without significant spinal stenosis. Electronically Signed   By: Franchot Gallo M.D.   On: 08/19/2018 10:53   Mr Lumbar Spine Wo Contrast  Result Date: 08/19/2018 CLINICAL DATA:  Pre vertebroplasty. T12 fracture. Back pain 2 months EXAM: MRI THORACIC AND LUMBAR SPINE WITHOUT CONTRAST TECHNIQUE: Multiplanar and multiecho pulse sequences of the thoracic and lumbar spine were obtained without  intravenous contrast. COMPARISON:  CT lumbar spine 08/18/2018 FINDINGS: MRI THORACIC SPINE FINDINGS Alignment: Mild anterolisthesis C7-T1, T2-3, T3-4. Mild retrolisthesis T11-12 Vertebrae: Mild to moderate compression fracture T12 with diffuse bone marrow edema. Paraspinous edema bilaterally compatible with recent fracture. Mild retropulsion of the superior endplate of A12 into the canal without significant stenosis. Cord: Negative for cord compression. Normal cord signal without cord lesion. Paraspinal and other soft tissues: Paraspinous soft tissue swelling around the T12 fracture bilaterally. No mass lesion. Disc levels: Advanced disc degeneration throughout the entire lumbar spine with disc space narrowing and spurring diffusely. Arthrodesis at T7-8. No cord compression or significant spinal stenosis. Mild foraminal narrowing bilaterally at multiple levels due to spurring. MRI LUMBAR SPINE FINDINGS Segmentation:  Normal Alignment:  Mild retrolisthesis L1-2 and L2-3 Vertebrae: Mild to moderate fracture of T12 which appears acute or subacute. No lumbar fracture identified. Conus medullaris and cauda equina: Conus extends to the L1 level. Conus and cauda equina appear normal. Paraspinal and other soft tissues: Negative for paraspinous mass or edema Disc levels: L1-2: Mild disc degeneration with Schmorl's node L2-3: Disc degeneration with Schmorl's node. Mild disc bulging and endplate spurring without significant stenosis L2-3: Mild disc and moderate facet degeneration. Negative for stenosis L4-5: Diffuse bulging of the disc with moderate facet degeneration. Negative for stenosis L5-S1: Disc degeneration and spurring left greater than right with left foraminal encroachment. Bilateral facet hypertrophy. IMPRESSION: MR THORACIC SPINE IMPRESSION Mild to moderate compression fracture T12 with bone marrow edema and paraspinous soft tissue edema compatible with acute or subacute fracture. Mild retropulsion of T12 into the  canal without significant spinal stenosis. Severe diffuse thoracic disc degeneration without significant spinal stenosis. MR LUMBAR SPINE IMPRESSION Negative for lumbar fracture. Multilevel degenerative change as above without significant spinal stenosis. Electronically Signed   By: Franchot Gallo M.D.   On: 08/19/2018 10:53   Ct Abdomen Pelvis W Contrast  Result Date: 08/18/2018 CLINICAL DATA:  Right greater than left lower abdominal pain. EXAM: CT ABDOMEN AND PELVIS WITH CONTRAST TECHNIQUE: Multidetector CT imaging of the abdomen and pelvis was performed using the standard protocol following bolus administration of intravenous contrast. CONTRAST:  82mL OMNIPAQUE IOHEXOL 300 MG/ML  SOLN COMPARISON:  MRI abdomen dated March 26, 2018. CT abdomen pelvis dated January 15, 2015. FINDINGS: Lower chest: No acute abnormality. Hepatobiliary: Multiple hepatic simple cysts and small hemangiomas are unchanged. Unchanged small gallbladder polyp versus gallstone. No gallbladder wall thickening. Unchanged mild common bile duct and central intrahepatic biliary dilatation. Pancreas: Unremarkable. No pancreatic ductal dilatation or surrounding inflammatory changes. Spleen: Normal in size without focal abnormality. Adrenals/Urinary Tract: The adrenal glands are unremarkable. There is a 1.2 cm exophytic, high-attenuating lesion arising from the midpole of the right kidney, previously characterized as a hemorrhagic/proteinaceous cyst on prior MRI. Subcentimeter low-density lesions in both kidneys remain too small to characterize, but are unchanged. Slight interval enlargement of the nonobstructive calculi in the lower pole of the  left kidney and midpole of the right kidney. Decreased mild left hydronephrosis to the level of the UPJ. No right hydronephrosis. The bladder is unremarkable for the degree of distention. Stomach/Bowel: Stomach is within normal limits. Appendix is surgically absent. No evidence of bowel wall thickening,  distention, or inflammatory changes. Moderate left-sided colonic diverticulosis. Large colonic stool burden. Vascular/Lymphatic: Aortic atherosclerosis. No enlarged abdominal or pelvic lymph nodes. Reproductive: Status post hysterectomy. No adnexal masses. Other: No abdominal wall hernia or abnormality. No abdominopelvic ascites. No pneumoperitoneum. Musculoskeletal: No acute or significant osseous findings. Slight interval progression of the now moderate T12 compression deformity since November 2019. IMPRESSION: 1.  No acute intra-abdominal process. 2. Prominent stool throughout the colon. Correlate for constipation. 3. Slight interval increase in size of bilateral nonobstructive nephrolithiasis. 4. Slight interval progression of the now moderate T12 compression deformity since November 2019. Electronically Signed   By: Titus Dubin M.D.   On: 08/18/2018 15:22    Orson Eva, DO  [facility] Pager [phone]  If 7PM-7AM, please contact night-coverage [email or web address] Password [email or web address] 08/22/2018, 10:28 AM   LOS: 3 days

## 2018-08-22 NOTE — Progress Notes (Addendum)
ANTICOAGULATION CONSULT NOTE - Follow Up Consult  Pharmacy Consult for Heparin (warfarin on hold) Indication: multiple ischemic CVAs   Allergies  Allergen Reactions  . Epinephrine Anaphylaxis  . Demerol [Meperidine] Other (See Comments)    headaches  . Vicodin [Hydrocodone-Acetaminophen] Other (See Comments)    [name] it makes her crazy. Pt tolerates acetaminophen    Patient Measurements: Height: 5\' 2"  (157.5 cm) Weight: 138 lb 3.7 oz (62.7 kg) IBW/kg (Calculated) : 50.1  HEPARIN DW (KG): 59.7   Temp: 97.8 F (36.6 C) (03/29 0741) Temp Source: Oral (03/29 0741) BP: 161/74 (03/29 0741) Pulse Rate: 57 (03/29 0741)Labs: Recent Labs    08/20/18 0502 08/21/18 2595 08/21/18 1251 08/21/18 2237 08/22/18 0447 08/22/18 0912  HGB 13.2 12.9  --   --  12.6  --   HCT 39.9 40.2  --   --  39.6  --   PLT 278 266  --   --  284  --   APTT  --   --  >200*  --   --   --   LABPROT 26.4* 15.5*  --   --  15.1  --   INR 2.5* 1.2  --   --  1.2  --   HEPARINUNFRC  --   --  1.50* 1.09*  --  1.26*    Estimated Creatinine Clearance: 33.1 mL/min (A) (by C-G formula based on SCr of 1.16 mg/dL (H)).  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 Patient was found to have a T12 compression fracture and warfarin is now on hold for possible intervention in the near future. Using heparin while warfarin is on hold.   Heparin level is elevated at 1.26 this morning.  Confirmed with RN Larene Beach that lab was drawn from arm opposite of heparin infusion. Patient is very sensitive to heparin.  08/22/18 PM UPDATE:  Heparin level of 0.57 IU/mL is within desired therapeutic goal range.  Goal of Therapy:  Heparin level 0.3--0.7 IU/mL Monitor platelets by anticoagulation protocol: Yes   Plan:  Continue heparin at 500 units/hr   Daily CBC/HL Monitor for bleeding  Despina Pole, Pharm. D. Clinical Pharmacist 08/22/2018 10:39  AM

## 2018-08-22 NOTE — Progress Notes (Addendum)
Called pharmacist, Jonelle Sidle, to inform her about the heparin level. She instructed me to turn off pump for 1 hour and she would recalculate rate.

## 2018-08-22 NOTE — Progress Notes (Signed)
ANTICOAGULATION CONSULT NOTE - Follow Up Consult  Pharmacy Consult for Heparin (warfarin on hold) Indication: multiple ischemic CVAs   Allergies  Allergen Reactions  . Epinephrine Anaphylaxis  . Demerol [Meperidine] Other (See Comments)    headaches  . Vicodin [Hydrocodone-Acetaminophen] Other (See Comments)    [name] it makes her crazy. Pt tolerates acetaminophen    Patient Measurements: Height: 5\' 2"  (157.5 cm) Weight: 134 lb 8 oz (61 kg) IBW/kg (Calculated) : 50.1  HEPARIN DW (KG): 59.7   Temp: 97.9 F (36.6 C) (03/28 2135) Temp Source: Oral (03/28 2135) BP: 153/87 (03/28 2135) Pulse Rate: 70 (03/28 2135)Labs: Recent Labs    08/19/18 0457 08/19/18 0845 08/20/18 0502 08/21/18 0607 08/21/18 1251 08/21/18 2237  HGB 13.8  --  13.2 12.9  --   --   HCT 43.8  --  39.9 40.2  --   --   PLT 285  --  278 266  --   --   APTT  --   --   --   --  >200*  --   LABPROT  --  29.5* 26.4* 15.5*  --   --   INR  --  2.9* 2.5* 1.2  --   --   HEPARINUNFRC  --   --   --   --  1.50* 1.09*  CREATININE 1.16*  --   --   --   --   --     Estimated Creatinine Clearance: 32.7 mL/min (A) (by C-G formula based on SCr of 1.16 mg/dL (H)).  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 Patient was found to have a T12 compression fracture and warfarin is now on hold for possible intervention in the near future. Using heparin while warfarin is on hold.   Heparin level is elevated at 1.09 tonight, confirmed with RN that lab was drawn from arm opposite of heparin infusion   Goal of Therapy:  Heparin level 0.3--0.7 IU/mL Monitor platelets by anticoagulation protocol: Yes   Plan: Hold heparin x 1 hr Re-start heparin at 750 units/hr at 0100 0900 HL Daily CBC/HL Monitor for bleeding  Narda Bonds, PharmD, BCPS Clinical Pharmacist Phone: [phone]

## 2018-08-23 ENCOUNTER — Encounter (HOSPITAL_COMMUNITY): Payer: Self-pay

## 2018-08-23 ENCOUNTER — Ambulatory Visit (HOSPITAL_COMMUNITY)
Admission: RE | Admit: 2018-08-23 | Discharge: 2018-08-23 | Disposition: A | Discharge status: Home / Self Care | Payer: PPO | Source: Ambulatory Visit | Attending: Internal Medicine | Admitting: Internal Medicine

## 2018-08-23 DIAGNOSIS — Z79890 Hormone replacement therapy (postmenopausal): Secondary | ICD-10-CM

## 2018-08-23 DIAGNOSIS — S22080G Wedge compression fracture of T11-T12 vertebra, subsequent encounter for fracture with delayed healing: Secondary | ICD-10-CM | POA: Insufficient documentation

## 2018-08-23 DIAGNOSIS — J45909 Unspecified asthma, uncomplicated: Secondary | ICD-10-CM | POA: Insufficient documentation

## 2018-08-23 DIAGNOSIS — Z885 Allergy status to narcotic agent status: Secondary | ICD-10-CM | POA: Insufficient documentation

## 2018-08-23 DIAGNOSIS — S22080A Wedge compression fracture of T11-T12 vertebra, initial encounter for closed fracture: Secondary | ICD-10-CM | POA: Diagnosis not present

## 2018-08-23 DIAGNOSIS — E039 Hypothyroidism, unspecified: Secondary | ICD-10-CM | POA: Insufficient documentation

## 2018-08-23 DIAGNOSIS — Z7901 Long term (current) use of anticoagulants: Secondary | ICD-10-CM

## 2018-08-23 DIAGNOSIS — Z90710 Acquired absence of both cervix and uterus: Secondary | ICD-10-CM | POA: Insufficient documentation

## 2018-08-23 DIAGNOSIS — Z79899 Other long term (current) drug therapy: Secondary | ICD-10-CM | POA: Insufficient documentation

## 2018-08-23 DIAGNOSIS — X58XXXD Exposure to other specified factors, subsequent encounter: Secondary | ICD-10-CM | POA: Insufficient documentation

## 2018-08-23 DIAGNOSIS — K219 Gastro-esophageal reflux disease without esophagitis: Secondary | ICD-10-CM

## 2018-08-23 DIAGNOSIS — I10 Essential (primary) hypertension: Secondary | ICD-10-CM

## 2018-08-23 DIAGNOSIS — Z8673 Personal history of transient ischemic attack (TIA), and cerebral infarction without residual deficits: Secondary | ICD-10-CM

## 2018-08-23 HISTORY — PX: IR KYPHO LUMBAR INC FX REDUCE BONE BX UNI/BIL CANNULATION INC/IMAGING: IMG5519

## 2018-08-23 LAB — CBC
HCT: 40.5 % (ref 36.0–46.0)
Hemoglobin: 12.7 g/dL (ref 12.0–15.0)
MCH: 28.8 pg (ref 26.0–34.0)
MCHC: 31.4 g/dL (ref 30.0–36.0)
MCV: 91.8 fL (ref 80.0–100.0)
Platelets: 290 10*3/uL (ref 150–400)
RBC: 4.41 MIL/uL (ref 3.87–5.11)
RDW: 13.2 % (ref 11.5–15.5)
WBC: 7.2 10*3/uL (ref 4.0–10.5)
nRBC: 0 % (ref 0.0–0.2)

## 2018-08-23 LAB — HEPARIN LEVEL (UNFRACTIONATED): Heparin Unfractionated: 0.54 IU/mL (ref 0.30–0.70)

## 2018-08-23 MED ORDER — FENTANYL CITRATE (PF) 100 MCG/2ML IJ SOLN
INTRAMUSCULAR | Status: AC | PRN
  Administered 2018-08-23 (×3): 25 ug via INTRAVENOUS

## 2018-08-23 MED ORDER — HEPARIN (PORCINE) 25000 UT/250ML-% IV SOLN
550.0000 [IU]/h | INTRAVENOUS | Status: DC
  Administered 2018-08-23: 500 [IU]/h via INTRAVENOUS

## 2018-08-23 MED ORDER — FENTANYL CITRATE (PF) 100 MCG/2ML IJ SOLN
INTRAMUSCULAR | Status: AC
  Filled 2018-08-23: qty 2

## 2018-08-23 MED ORDER — MIDAZOLAM HCL 2 MG/2ML IJ SOLN
INTRAMUSCULAR | Status: AC
  Filled 2018-08-23: qty 2

## 2018-08-23 MED ORDER — LIDOCAINE HCL (PF) 1 % IJ SOLN
INTRAMUSCULAR | Status: AC
  Filled 2018-08-23: qty 30

## 2018-08-23 MED ORDER — MORPHINE SULFATE (PF) 2 MG/ML IV SOLN
2.0000 mg | Freq: Once | INTRAVENOUS | Status: AC
  Administered 2018-08-23: 2 mg via INTRAVENOUS
  Filled 2018-08-23: qty 1

## 2018-08-23 MED ORDER — CEFAZOLIN SODIUM-DEXTROSE 2-4 GM/100ML-% IV SOLN
INTRAVENOUS | Status: AC
  Filled 2018-08-23: qty 100

## 2018-08-23 MED ORDER — MIDAZOLAM HCL 2 MG/2ML IJ SOLN
INTRAMUSCULAR | Status: AC | PRN
  Administered 2018-08-23: 1 mg via INTRAVENOUS
  Administered 2018-08-23 (×2): 0.5 mg via INTRAVENOUS

## 2018-08-23 MED ORDER — CEFAZOLIN SODIUM-DEXTROSE 2-4 GM/100ML-% IV SOLN
2.0000 g | INTRAVENOUS | Status: AC
  Administered 2018-08-23: 2 g via INTRAVENOUS

## 2018-08-23 NOTE — Procedures (Signed)
Pre procedural Dx: Symptomatic compression fracture Post procedural Dx: Same  Technically successful cement augmentation of the T12 vertebral body.  Patient is to lie flat for 3 hours.  EBL: Minimal Complications: None immediate.  Jay Hadden Steig, MD Pager #: [phone]   

## 2018-08-23 NOTE — Progress Notes (Signed)
Physical Therapy Treatment Patient Details Name: [name] MRN: [id number] DOB: Dec 18, 1935 Today's Date: 08/23/2018    History of Present Illness [name] is a 83 y.o. female with medical history significant for multiple acute ischemic CVAs prompting chronic anticoagulation on warfarin, chronic abdominal pain, acquired hypothyroidism, hypertension, chronic diastolic heart failure, who is admitted to [facility] on 08/18/2018 for pain control in setting of progression of T12 compression fracture at presenting from home to [facility] ED complaining of low back pain.     PT Comments    Pt continues to be limited in mobility due to significant LBP. Pt demo'ing R toe walking with R ankle PF and inversion this date which she reported has worsened since being in the hospital; she stated that it significantly increased her LBP if she put full pressure on it/put foot flat on floor during gait. She denied any shooting pains down RLE, just numbness. Pt required min A for sit > supine due to pain and weakness; cued her for proper log rolling technique to assist with reducing LBP with bed mobility and he verbalized understanding. Continue to recommend SNF upon d/c due to deficits in functional mobility. Pt due for kyphoplasty today at [facility].      Follow Up Recommendations  SNF;Supervision - Intermittent     Equipment Recommendations  Rolling walker with 5" wheels    Recommendations for Other Services       Precautions / Restrictions Precautions Precautions: Fall Precaution Comments: T12 compression fx Restrictions Weight Bearing Restrictions: No    Mobility  Bed Mobility Overal bed mobility: Needs Assistance Bed Mobility: Rolling;Sidelying to Sit   Sidelying to sit: Min guard Supine to sit: Min guard Sit to supine: Min assist Sit to sidelying: Min assist General bed mobility comments: requires use of siderail and assistance to move BLE when getting into of  bed  Transfers Overall transfer level: Needs assistance Equipment used: Rolling walker (2 wheeled) Transfers: Sit to/from [facility] Sit to Stand: Supervision Stand pivot transfers: Min guard       General transfer comment: increased time, c/o increased right hip/right sided back pain during stand to sitting when flexing trunk  Ambulation/Gait Ambulation/Gait assistance: Min guard;Min assist Gait Distance (Feet): 40 Feet Assistive device: Rolling walker (2 wheeled) Gait Pattern/deviations: Decreased step length - right;Decreased step length - left;Decreased stride length Gait velocity: decreased   General Gait Details: slow labored cadence with occasional stopping with guarding of right side of low back due to pain with radiation to right hip, occasional buckling of righ knee; difficulty placing RLE fully on ground and tended to toe walk on RLE due to pain   Stairs             Wheelchair Mobility    Modified Rankin (Stroke Patients Only)       Balance Overall balance assessment: Needs assistance Sitting-balance support: Feet supported;No upper extremity supported Sitting balance-Leahy Scale: Fair Sitting balance - Comments: fair/good when supporting self with BUE   Standing balance support: During functional activity;Bilateral upper extremity supported Standing balance-Leahy Scale: Fair Standing balance comment: using RW                            Cognition Arousal/Alertness: Awake/alert Behavior During Therapy: WFL for tasks assessed/performed Overall Cognitive Status: Within Functional Limits for tasks assessed  Exercises      General Comments        Pertinent Vitals/Pain Pain Assessment: 0-10 Pain Score: (12/10) Pain Location: Back pain more on the right > left, numbness down RLE, difficulty putting RLE on ground this date due to pain Pain Descriptors / Indicators:  Sharp;Spasm Pain Intervention(s): Limited activity within patient's tolerance;Monitored during session;Repositioned    Home Living                      Prior Function            PT Goals (current goals can now be found in the care plan section) Acute Rehab PT Goals Patient Stated Goal: return home after rehab PT Goal Formulation: With patient Time For Goal Achievement: 09/02/18 Potential to Achieve Goals: Good    Frequency    Min 3X/week      PT Plan      Co-evaluation              AM-PAC PT "6 Clicks" Mobility   Outcome Measure  Help needed turning from your back to your side while in a flat bed without using bedrails?: A Little Help needed moving from lying on your back to sitting on the side of a flat bed without using bedrails?: A Little Help needed moving to and from a bed to a chair (including a wheelchair)?: A Little Help needed standing up from a chair using your arms (e.g., wheelchair or bedside chair)?: A Little Help needed to walk in hospital room?: A Lot Help needed climbing 3-5 steps with a railing? : A Lot 6 Click Score: 16    End of Session Equipment Utilized During Treatment: Gait belt Activity Tolerance: Patient tolerated treatment well;Patient limited by fatigue;Patient limited by pain Patient left: with call bell/phone within reach;in bed;with bed alarm set;with nursing/sitter in room Nurse Communication: Mobility status PT Visit Diagnosis: Unsteadiness on feet (R26.81);Other abnormalities of gait and mobility (R26.89);Muscle weakness (generalized) (M62.81)     Time: 8563-1497 PT Time Calculation (min) (ACUTE ONLY): 20 min  Charges:  $Therapeutic Activity: 8-22 mins                        Geraldine Solar PT, DPT

## 2018-08-23 NOTE — Progress Notes (Signed)
Patient's hearing aids, necklace, watch, medical alert bracelet, earrings, and multiple rings were placed in side table mirror compartment per patient's request.

## 2018-08-23 NOTE — H&P (Signed)
Chief Complaint: Patient was seen in consultation today for Thoracic 12 Kyphoplasty at the request of Tat,David  Referring Physician(s): Tat,David  Supervising Physician: Sandi Mariscal  Patient Status: [facility] IP  History of Present Illness: [name] is a 83 y.o. female   Pt developed back pain immediately after moving case bottled water in grocery store Pain has been worsening for days. Pain meds without relief To [facility] for evaluation and treatment  3/26 MR:  IMPRESSION: MR THORACIC SPINE IMPRESSION Mild to moderate compression fracture T12 with bone marrow edema and paraspinous soft tissue edema compatible with acute or subacute fracture. Mild retropulsion of T12 into the canal without significant spinal stenosis. Severe diffuse thoracic disc degeneration without significant spinal stenosis.  MR LUMBAR SPINE IMPRESSION Negative for lumbar fracture. Multilevel degenerative change as above without significant spinal stenosis.  Request for T12 KP Imaging reviewed with Dr Pascal Lux He has approved procedure Here now for same  Past Medical History:  Diagnosis Date   Adenomatous colon polyp 2008   Asthma    Complication of anesthesia    CVA (cerebral vascular accident) ([place])    sl weaker on rt foot   Diverticulosis    GERD (gastroesophageal reflux disease)    Hemorrhoids 2007   HTN (hypertension)    Hyperplastic colon polyp 2005   Hypothyroidism    Narcolepsy    PONV (postoperative nausea and vomiting)    TIA (transient ischemic attack)     Past Surgical History:  Procedure Laterality Date   ABDOMINAL HYSTERECTOMY     age 40, complicated by poor wound healing, followed by revision of scar and radiation for ?malignancy   BREAST ENHANCEMENT SURGERY     BREAST IMPLANT EXCHANGE Right 06/23/2016   Procedure: RIGHT BREAST IMPLANT REMOVAL AND REPLACEMENT;  Surgeon: Cristine Polio, MD;  Location: [facility];  Service: Plastics;   Laterality: Right;   COLONOSCOPY  2005   2 hyperplastic polyps   COLONOSCOPY  2007   Dr. Oneida Alar- hemorrhoids   COLONOSCOPY  2008   Dr. Reed Pandy polyp- rare sigmoid diverticulosis, internal hemorrhoids   COLONOSCOPY  12/01/2011   Procedure: COLONOSCOPY;  Surgeon: Danie Binder, MD;  [facility];  Service: Endoscopy;  Laterality: N/A;  12:30 PM   ESOPHAGOGASTRODUODENOSCOPY (EGD) WITH ESOPHAGEAL DILATION N/A 04/15/2013   Procedure: ESOPHAGOGASTRODUODENOSCOPY (EGD) WITH ESOPHAGEAL DILATION;  Surgeon: Danie Binder, MD;  [facility];  Service: Endoscopy;  Laterality: N/A;  11:45-moved to 12:30 Darius Bump to notify pt   EYE SURGERY  2010   EYE SURGERY     right thumb surgery      Allergies: Epinephrine; Demerol [meperidine]; and Vicodin [hydrocodone-acetaminophen]  Medications: Prior to Admission medications   Medication Sig Start Date End Date Taking? Authorizing Provider  albuterol (PROVENTIL) (2.5 MG/3ML) 0.083% nebulizer solution Take 3 mLs (2.5 mg total) by nebulization every 6 (six) hours as needed for wheezing or shortness of breath. 06/27/13   Baird Lyons D, MD  amLODipine (NORVASC) 5 MG tablet TAKE 1 TABLET BY MOUTH EVERY DAY 11/21/16   Herminio Commons, MD  Ascorbic Acid (VITAMIN C) 1000 MG tablet Take 1,000 mg by mouth daily.    [provider]  B Complex Vitamins (VITAMIN B COMPLEX) TABS Take 1 tablet by mouth daily.    [provider]  Biotin 300 MCG TABS Take 1 tablet by mouth daily after supper.     [provider]  buPROPion (WELLBUTRIN XL) 150 MG 24 hr  tablet Take 150 mg by mouth daily. 01/15/17   [provider]  CALCIUM CARBONATE PO Take 1 tablet by mouth daily. Patient states medication is calcium 1200 mg without vitamin D.    [provider]  cholecalciferol (VITAMIN D) 1000 UNITS tablet Take 1,000 Units by mouth daily.    [provider]  CYANOCOBALAMIN PO Take 1 tablet by  mouth daily. Patient unsure of strength    [provider]  ezetimibe (ZETIA) 10 MG tablet Take 10 mg by mouth daily.    [provider]  Ferrous Gluconate (IRON) 240 (27 FE) MG TABS Take 1 tablet by mouth daily.    [provider]  Flaxseed, Linseed, (FLAXSEED OIL) 1000 MG CAPS Take 1 capsule by mouth.     [provider]  furosemide (LASIX) 20 MG tablet Take 20 mg by mouth daily after supper.    [provider]  furosemide (LASIX) 40 MG tablet TAKE 1 TABLET BY MOUTH TWICE DAILY Patient taking differently: 40 mg daily.  01/29/18   Herminio Commons, MD  Lecithin 1200 MG CAPS Take 1 capsule by mouth daily.    [provider]  levothyroxine (SYNTHROID, LEVOTHROID) 75 MCG tablet Take 50 mcg by mouth daily.  05/12/17   [provider]  LUTEIN PO Take 1 capsule by mouth daily. Strength varies based on what is purchased at the time.    [provider]  Magnesium 250 MG TABS Take 1 tablet by mouth daily.    [provider]  metoprolol tartrate (LOPRESSOR) 25 MG tablet TAKE 1 TABLET BY MOUTH TWICE DAILY Patient taking differently: 12.5 mg 2 (two) times daily.  07/03/16   Herminio Commons, MD  Nebulizers (COMPRESSOR/NEBULIZER) MISC Use as directed 06/27/13   Deneise Lever, MD  Omega-3 Fatty Acids (FISH OIL) 1200 MG CAPS Take 1,200 mg by mouth daily.    [provider]  pantoprazole (PROTONIX) 40 MG tablet Take 1 tablet by mouth daily as needed. 06/03/17   [provider]  potassium chloride (K-DUR) 10 MEQ tablet Take 10 mEq by mouth 2 (two) times daily.    [provider]  Turmeric 500 MG TABS Take by mouth daily.    [provider]  warfarin (COUMADIN) 4 MG tablet Take 4 mg by mouth daily.    [provider]  Zinc 50 MG TABS Take 1 tablet by mouth daily.    [provider]     Family History  Problem Relation Age of Onset   Colon cancer Father        age 24    Prostate cancer Father    Pancreatic cancer Mother        age 52    Social History   Socioeconomic History   Marital status: Widowed    Spouse name: Not on file   Number of children: Not on file   Years of education: Not on file   Highest education level: Not on file  Occupational History   Not on file  Social Needs   Financial resource strain: Not on file   Food insecurity:    Worry: Not on file    Inability: Not on file   Transportation needs:    Medical: Not on file    Non-medical: Not on file  Tobacco Use   Smoking status: Never Smoker   Smokeless tobacco: Never Used  Substance and Sexual Activity   Alcohol use: No   Drug use: No  Sexual activity: Never  Lifestyle   Physical activity:    Days per week: Not on file    Minutes per session: Not on file   Stress: Not on file  Relationships   Social connections:    Talks on phone: Not on file    Gets together: Not on file    Attends religious service: Not on file    Active member of club or organization: Not on file    Attends meetings of clubs or organizations: Not on file    Relationship status: Not on file  Other Topics Concern   Not on file  Social History Narrative   Not on file     Review of Systems: A 12 point ROS discussed and pertinent positives are indicated in the HPI above.  All other systems are negative.  Review of Systems  Constitutional: Positive for activity change, appetite change and fatigue. Negative for fever.  Respiratory: Negative for cough and shortness of breath.   Gastrointestinal: Positive for abdominal pain.  Musculoskeletal: Positive for back pain and gait problem.  Neurological: Positive for weakness.  Psychiatric/Behavioral: Negative for behavioral problems and confusion.    Vital Signs: BP (!) 166/71    Pulse 60    Temp 98.2 F (36.8 C) (Oral)    Resp (!) 26    Ht 5\' 2"  (1.575 m)    Wt 138 lb (62.6 kg)    SpO2 100%    BMI 25.24 kg/m   Physical  Exam Vitals signs reviewed.  Cardiovascular:     Rate and Rhythm: Normal rate and regular rhythm.     Heart sounds: Normal heart sounds.  Pulmonary:     Effort: Pulmonary effort is normal.     Breath sounds: Normal breath sounds.  Abdominal:     General: Bowel sounds are normal.  Musculoskeletal: Normal range of motion.  Skin:    General: Skin is warm and dry.  Neurological:     Mental Status: She is alert and oriented to person, place, and time.  Psychiatric:        Mood and Affect: Mood normal.        Behavior: Behavior normal.        Thought Content: Thought content normal.        Judgment: Judgment normal.     Imaging: Dg Thoracic Spine W/swimmers  Result Date: 08/18/2018 CLINICAL DATA:  Thoracic spine pain since lifting water a week ago. EXAM: THORACIC SPINE - 3 VIEWS COMPARISON:  CT chest 09/20/2012 and CT abdomen and pelvis 01/15/2015. FINDINGS: S shaped thoracolumbar scoliosis is identified. Remote T11 and T12 compression fractures are noted. No acute fracture. Mild multilevel degenerative change noted. IMPRESSION: No acute finding. S shaped scoliosis. Mild, remote T11 and T12 compression fractures. Electronically Signed   By: Inge Rise M.D.   On: 08/18/2018 10:34   Dg Lumbar Spine Complete  Result Date: 08/18/2018 CLINICAL DATA:  Low back pain for 1 week radiating into both legs after lifting water. EXAM: LUMBAR SPINE - COMPLETE 4+ VIEW COMPARISON:  CT abdomen and pelvis 01/15/2015. FINDINGS: Convex left scoliosis is unchanged. The patient has remote T12 superior endplate and P50 inferior endplate compression fractures which are unchanged. Lower lumbar facet arthropathy is noted. Mild loss of disc space height is seen at L5-S1. Paraspinous structures demonstrate a stone in the lower pole of the left kidney, unchanged. Massive volume of stool throughout the colon is seen. IMPRESSION: No acute abnormality or change compared to the prior exam.  Mild, remote T11 and T12  compression fractures. Lower lumbar spondylosis. Nonobstructing stone left kidney, unchanged. Very large volume of stool throughout the colon. Electronically Signed   By: Inge Rise M.D.   On: 08/18/2018 10:30   Mr Thoracic Spine Wo Contrast  Result Date: 08/19/2018 CLINICAL DATA:  Pre vertebroplasty. T12 fracture. Back pain 2 months EXAM: MRI THORACIC AND LUMBAR SPINE WITHOUT CONTRAST TECHNIQUE: Multiplanar and multiecho pulse sequences of the thoracic and lumbar spine were obtained without intravenous contrast. COMPARISON:  CT lumbar spine 08/18/2018 FINDINGS: MRI THORACIC SPINE FINDINGS Alignment: Mild anterolisthesis C7-T1, T2-3, T3-4. Mild retrolisthesis T11-12 Vertebrae: Mild to moderate compression fracture T12 with diffuse bone marrow edema. Paraspinous edema bilaterally compatible with recent fracture. Mild retropulsion of the superior endplate of R67 into the canal without significant stenosis. Cord: Negative for cord compression. Normal cord signal without cord lesion. Paraspinal and other soft tissues: Paraspinous soft tissue swelling around the T12 fracture bilaterally. No mass lesion. Disc levels: Advanced disc degeneration throughout the entire lumbar spine with disc space narrowing and spurring diffusely. Arthrodesis at T7-8. No cord compression or significant spinal stenosis. Mild foraminal narrowing bilaterally at multiple levels due to spurring. MRI LUMBAR SPINE FINDINGS Segmentation:  Normal Alignment:  Mild retrolisthesis L1-2 and L2-3 Vertebrae: Mild to moderate fracture of T12 which appears acute or subacute. No lumbar fracture identified. Conus medullaris and cauda equina: Conus extends to the L1 level. Conus and cauda equina appear normal. Paraspinal and other soft tissues: Negative for paraspinous mass or edema Disc levels: L1-2: Mild disc degeneration with Schmorl's node L2-3: Disc degeneration with Schmorl's node. Mild disc bulging and endplate spurring without significant  stenosis L2-3: Mild disc and moderate facet degeneration. Negative for stenosis L4-5: Diffuse bulging of the disc with moderate facet degeneration. Negative for stenosis L5-S1: Disc degeneration and spurring left greater than right with left foraminal encroachment. Bilateral facet hypertrophy. IMPRESSION: MR THORACIC SPINE IMPRESSION Mild to moderate compression fracture T12 with bone marrow edema and paraspinous soft tissue edema compatible with acute or subacute fracture. Mild retropulsion of T12 into the canal without significant spinal stenosis. Severe diffuse thoracic disc degeneration without significant spinal stenosis. MR LUMBAR SPINE IMPRESSION Negative for lumbar fracture. Multilevel degenerative change as above without significant spinal stenosis. Electronically Signed   By: Franchot Gallo M.D.   On: 08/19/2018 10:53   Mr Lumbar Spine Wo Contrast  Result Date: 08/19/2018 CLINICAL DATA:  Pre vertebroplasty. T12 fracture. Back pain 2 months EXAM: MRI THORACIC AND LUMBAR SPINE WITHOUT CONTRAST TECHNIQUE: Multiplanar and multiecho pulse sequences of the thoracic and lumbar spine were obtained without intravenous contrast. COMPARISON:  CT lumbar spine 08/18/2018 FINDINGS: MRI THORACIC SPINE FINDINGS Alignment: Mild anterolisthesis C7-T1, T2-3, T3-4. Mild retrolisthesis T11-12 Vertebrae: Mild to moderate compression fracture T12 with diffuse bone marrow edema. Paraspinous edema bilaterally compatible with recent fracture. Mild retropulsion of the superior endplate of E93 into the canal without significant stenosis. Cord: Negative for cord compression. Normal cord signal without cord lesion. Paraspinal and other soft tissues: Paraspinous soft tissue swelling around the T12 fracture bilaterally. No mass lesion. Disc levels: Advanced disc degeneration throughout the entire lumbar spine with disc space narrowing and spurring diffusely. Arthrodesis at T7-8. No cord compression or significant spinal stenosis.  Mild foraminal narrowing bilaterally at multiple levels due to spurring. MRI LUMBAR SPINE FINDINGS Segmentation:  Normal Alignment:  Mild retrolisthesis L1-2 and L2-3 Vertebrae: Mild to moderate fracture of T12 which appears acute or subacute. No lumbar fracture identified. Conus medullaris  and cauda equina: Conus extends to the L1 level. Conus and cauda equina appear normal. Paraspinal and other soft tissues: Negative for paraspinous mass or edema Disc levels: L1-2: Mild disc degeneration with Schmorl's node L2-3: Disc degeneration with Schmorl's node. Mild disc bulging and endplate spurring without significant stenosis L2-3: Mild disc and moderate facet degeneration. Negative for stenosis L4-5: Diffuse bulging of the disc with moderate facet degeneration. Negative for stenosis L5-S1: Disc degeneration and spurring left greater than right with left foraminal encroachment. Bilateral facet hypertrophy. IMPRESSION: MR THORACIC SPINE IMPRESSION Mild to moderate compression fracture T12 with bone marrow edema and paraspinous soft tissue edema compatible with acute or subacute fracture. Mild retropulsion of T12 into the canal without significant spinal stenosis. Severe diffuse thoracic disc degeneration without significant spinal stenosis. MR LUMBAR SPINE IMPRESSION Negative for lumbar fracture. Multilevel degenerative change as above without significant spinal stenosis. Electronically Signed   By: Franchot Gallo M.D.   On: 08/19/2018 10:53   Ct Abdomen Pelvis W Contrast  Result Date: 08/18/2018 CLINICAL DATA:  Right greater than left lower abdominal pain. EXAM: CT ABDOMEN AND PELVIS WITH CONTRAST TECHNIQUE: Multidetector CT imaging of the abdomen and pelvis was performed using the standard protocol following bolus administration of intravenous contrast. CONTRAST:  24mL OMNIPAQUE IOHEXOL 300 MG/ML  SOLN COMPARISON:  MRI abdomen dated March 26, 2018. CT abdomen pelvis dated January 15, 2015. FINDINGS: Lower chest: No  acute abnormality. Hepatobiliary: Multiple hepatic simple cysts and small hemangiomas are unchanged. Unchanged small gallbladder polyp versus gallstone. No gallbladder wall thickening. Unchanged mild common bile duct and central intrahepatic biliary dilatation. Pancreas: Unremarkable. No pancreatic ductal dilatation or surrounding inflammatory changes. Spleen: Normal in size without focal abnormality. Adrenals/Urinary Tract: The adrenal glands are unremarkable. There is a 1.2 cm exophytic, high-attenuating lesion arising from the midpole of the right kidney, previously characterized as a hemorrhagic/proteinaceous cyst on prior MRI. Subcentimeter low-density lesions in both kidneys remain too small to characterize, but are unchanged. Slight interval enlargement of the nonobstructive calculi in the lower pole of the left kidney and midpole of the right kidney. Decreased mild left hydronephrosis to the level of the UPJ. No right hydronephrosis. The bladder is unremarkable for the degree of distention. Stomach/Bowel: Stomach is within normal limits. Appendix is surgically absent. No evidence of bowel wall thickening, distention, or inflammatory changes. Moderate left-sided colonic diverticulosis. Large colonic stool burden. Vascular/Lymphatic: Aortic atherosclerosis. No enlarged abdominal or pelvic lymph nodes. Reproductive: Status post hysterectomy. No adnexal masses. Other: No abdominal wall hernia or abnormality. No abdominopelvic ascites. No pneumoperitoneum. Musculoskeletal: No acute or significant osseous findings. Slight interval progression of the now moderate T12 compression deformity since November 2019. IMPRESSION: 1.  No acute intra-abdominal process. 2. Prominent stool throughout the colon. Correlate for constipation. 3. Slight interval increase in size of bilateral nonobstructive nephrolithiasis. 4. Slight interval progression of the now moderate T12 compression deformity since November 2019.  Electronically Signed   By: Titus Dubin M.D.   On: 08/18/2018 15:22    Labs:  CBC: Recent Labs    08/20/18 0502 08/21/18 0607 08/22/18 0447 08/23/18 0423  WBC 5.6 5.6 6.7 7.2  HGB 13.2 12.9 12.6 12.7  HCT 39.9 40.2 39.6 40.5  PLT 278 266 284 290    COAGS: Recent Labs    08/19/18 0845 08/20/18 0502 08/21/18 0607 08/21/18 1251 08/22/18 0447  INR 2.9* 2.5* 1.2  --  1.2  APTT  --   --   --  >200*  --  BMP: Recent Labs    03/26/18 0938 08/18/18 1205 08/19/18 0457  NA  --  141 143  K  --  4.3 4.2  CL  --  101 102  CO2  --  30 30  GLUCOSE  --  107* 85  BUN  --  24* 20  CALCIUM  --  9.4 9.1  CREATININE 1.10* 1.12* 1.16*  GFRNONAA  --  46* 44*  GFRAA  --  53* 51*    LIVER FUNCTION TESTS: Recent Labs    08/18/18 1205  BILITOT 0.6  AST 33  ALT 26  ALKPHOS 89  PROT 7.3  ALBUMIN 4.4    TUMOR MARKERS: No results for input(s): AFPTM, CEA, CA199, CHROMGRNA in the last 8760 hours.  Assessment and Plan:  Severe back pain Acute T 12 painful fracture Scheduled for kyphoplasty Risks and benefits of Thoracic 12 kyphoplasty were discussed with the patient including, but not limited to education regarding the natural healing process of compression fractures without intervention, bleeding, infection, cement migration which may cause spinal cord damage, paralysis, pulmonary embolism or even death.  This interventional procedure involves the use of X-rays and because of the nature of the planned procedure, it is possible that we will have prolonged use of X-ray fluoroscopy.  Potential radiation risks to you include (but are not limited to) the following: - A slightly elevated risk for cancer  several years later in life. This risk is typically less than 0.5% percent. This risk is low in comparison to the normal incidence of human cancer, which is 33% for women and 50% for men according to the [employer]. - Radiation induced injury can include skin  redness, resembling a rash, tissue breakdown / ulcers and hair loss (which can be temporary or permanent).   The likelihood of either of these occurring depends on the difficulty of the procedure and whether you are sensitive to radiation due to previous procedures, disease, or genetic conditions.   IF your procedure requires a prolonged use of radiation, you will be notified and given written instructions for further action.  It is your responsibility to monitor the irradiated area for the 2 weeks following the procedure and to notify your physician if you are concerned that you have suffered a radiation induced injury.    All of the patient's questions were answered, patient is agreeable to proceed.  Consent signed and in chart.  Thank you for this interesting consult.  I greatly enjoyed meeting [name] and look forward to participating in their care.  A copy of this report was sent to the requesting provider on this date.  Electronically Signed: Lavonia Drafts, PA-C 08/23/2018, 1:01 PM   I spent a total of 40 Minutes    in face to face in clinical consultation, greater than 50% of which was counseling/coordinating care for T12 KP

## 2018-08-23 NOTE — Care Management (Signed)
CM spoke with [facility] about ins auth for [facility]. Pt will need to be re-assed by PT tomorrow morning and they will determine authorization after. Could potentially DC tomorrow afternoon is approved by [facility].

## 2018-08-23 NOTE — Progress Notes (Signed)
PROGRESS NOTE  [name] YNW:[id number] DOB: August 08, 1935 DOA: 08/18/2018 PCP: Celene Squibb, MD  Brief History: 83 year old female with a history of ischemic CVAs in the past resulting in warfarin anticoagulation, chronic abdominal pain, hypertension, hypothyroidism presenting with worsening back pain over 1 week. The patient states that she was at the grocery store getting a pack of bottled water. After lifting the water and transferring her to her grocery cart, the patient had immediate increase in her lower back pain. Fortunately, the patient was able to subsequently drive home, and she even transferred the package of water onto a dolly and back into her house. Over the next1 to 2 days, the patient related increasing lower back pain. She stated that in the past week she has visited her primary care provider twice. She stated that no radiographs were performed, but she was started on some type of muscle relaxer. She thinks that it may have been cyclobenzaprine. She stated that it did not help much. Because of increasing pain, this has limited the patient's ability to perform her usual ADLs. Fortunately, the patient is still able to make transfers and get to the bathroom albeit with significant pain and taking a significant amount of time. She denies any recent additional falls or trauma. She denies any saddle anesthesia, focal unilateral leg or arm weakness, bowel or bladder incontinence. She denies any fevers or chills. The patient states that she is only been using acetaminophen around-the-clock. She does not want to use any "strong pain medicine"that would cause her to have somnolence. She also complains of constipation with her last bowel movement on 08/17/2018. Normally she has a bowel movement almost daily. Because of worsening pain, the patient presented to emergency department for further evaluation. CT of the abdomen and pelvis showed slight interval  progression of her T12 compression deformity since 03/2018. The case was discussed with IR whom recommended MRI of the thoracic spine to see if the patient may be a candidate for kyphoplasty.  Assessment/Plan: Intractable back pain/T12 compression fracture -The patient wants to avoid"strong pain medicine" -continuetramadol -continueRobaxin -Acetaminophen around-the-clock -PT evaluation-->SNF -MRI thoracic spine--Mild to moderate compression fracture T12 with bone marrow edema and paraspinous soft tissue edema compatible with acute or subacute fracture -continue oxy IR for breakthrough pain -case discussed with IR, Dr. Ulice Dash Watts--> kyphoplasty on 08/23/18 -restart warfarin when ok with IR after procedure  Constipation/chronic abdominal pain -08/18/2018 CT abdomen pelvis--unchanged hepatic cyst, unchangedGBpolyp versus stone with unchanged mild CBD and central intrahepatic biliary ductal dilatation;prominent colonic stool;decreased mild left hydronephrosis to the level of UPJ -Started MiraLAX daily -Started Senokot  History Ischemic CVAs -holding warfarin for now in anticipation should pt require intervention for T12 compression fracture -started IV heparin as INR now subtherapeutic  Essential hypertension -BP elevated in part due to pain -Continue amlodipine and metoprolol tartrate  Hypothyroidism -Continue Synthroid  CKD stage III -Baseline creatinine 0.9-1.1  Depression/anxiety -Continue Wellbutrin     Disposition Plan: SNF3/31 if stable Family Communication: No Family at bedside  Consultants:none Code Status: FULL  DVT Prophylaxis:warfarin   Procedures: As Listed in Progress Note Above  Antibiotics: None  Subjective: Pt still  Complains of severe pain with any type of movement or physical therapy limiting her ADLs.  She denies any worsening of leg weakness or radicular symptoms.  No bowel or bladder incontinence.  Denies f/c,cp,  sob, n/v/d  Objective: Vitals:   08/22/18 0741 08/22/18 1422 08/22/18 2117 08/23/18  0540  BP: (!) 161/74 (!) 129/97 106/62 (!) 162/70  Pulse: (!) 57 (!) 56 70 (!) 59  Resp: 18 16 16 16   Temp: 97.8 F (36.6 C) 98.3 F (36.8 C) 97.7 F (36.5 C) 97.7 F (36.5 C)  TempSrc: Oral Oral Oral Oral  SpO2: 99% 98% 99% 99%  Weight:    63.1 kg  Height:        Intake/Output Summary (Last 24 hours) at 08/23/2018 1241 Last data filed at 08/23/2018 1036 Gross per 24 hour  Intake 480 ml  Output 1300 ml  Net -820 ml   Weight change: 0.4 kg Exam:   General:  Pt is alert, follows commands appropriately, not in acute distress  HEENT: No icterus, No thrush, No neck mass, [place]/AT  Cardiovascular: RRR, S1/S2, no rubs, no gallops  Respiratory: CTA bilaterally, no wheezing, no crackles, no rhonchi  Abdomen: Soft/+BS, non tender, non distended, no guarding  Extremities: No edema, No lymphangitis, No petechiae, No rashes, no synovitis   Data Reviewed: I have personally reviewed following labs and imaging studies Basic Metabolic Panel: Recent Labs  Lab 08/18/18 1205 08/19/18 0457  NA 141 143  K 4.3 4.2  CL 101 102  CO2 30 30  GLUCOSE 107* 85  BUN 24* 20  CREATININE 1.12* 1.16*  CALCIUM 9.4 9.1  MG  --  2.7*   Liver Function Tests: Recent Labs  Lab 08/18/18 1205  AST 33  ALT 26  ALKPHOS 89  BILITOT 0.6  PROT 7.3  ALBUMIN 4.4   No results for input(s): LIPASE, AMYLASE in the last 168 hours. No results for input(s): AMMONIA in the last 168 hours. Coagulation Profile: Recent Labs  Lab 08/18/18 1758 08/19/18 0845 08/20/18 0502 08/21/18 0607 08/22/18 0447  INR 2.9* 2.9* 2.5* 1.2 1.2   CBC: Recent Labs  Lab 08/18/18 1205 08/19/18 0457 08/20/18 0502 08/21/18 0607 08/22/18 0447 08/23/18 0423  WBC 7.0 6.1 5.6 5.6 6.7 7.2  NEUTROABS 5.2  --   --   --   --   --   HGB 14.6 13.8 13.2 12.9 12.6 12.7  HCT 45.5 43.8 39.9 40.2 39.6 40.5  MCV 90.1 90.9 88.1 90.5 91.5 91.8    PLT 300 285 278 266 284 290   Cardiac Enzymes: No results for input(s): CKTOTAL, CKMB, CKMBINDEX, TROPONINI in the last 168 hours. BNP: Invalid input(s): POCBNP CBG: No results for input(s): GLUCAP in the last 168 hours. HbA1C: No results for input(s): HGBA1C in the last 72 hours. Urine analysis:    Component Value Date/Time   COLORURINE STRAW (A) 08/18/2018 1139   APPEARANCEUR CLEAR 08/18/2018 1139   LABSPEC 1.005 08/18/2018 1139   PHURINE 8.0 08/18/2018 1139   GLUCOSEU NEGATIVE 08/18/2018 1139   HGBUR NEGATIVE 08/18/2018 1139   BILIRUBINUR NEGATIVE 08/18/2018 1139   KETONESUR NEGATIVE 08/18/2018 1139   PROTEINUR NEGATIVE 08/18/2018 1139   NITRITE NEGATIVE 08/18/2018 1139   LEUKOCYTESUR TRACE (A) 08/18/2018 1139   Sepsis Labs: @LABRCNTIP (procalcitonin:4,lacticidven:4) )No results found for this or any previous visit (from the past 240 hour(s)).   Scheduled Meds:  amLODipine  5 mg Oral Daily   buPROPion  150 mg Oral Daily   cholecalciferol  1,000 Units Oral Daily   ezetimibe  10 mg Oral Daily   ferrous sulfate  325 mg Oral Daily   furosemide  40 mg Oral Daily   levothyroxine  50 mcg Oral Q0600   methocarbamol  500 mg Oral TID   metoprolol tartrate  12.5 mg  Oral BID   polyethylene glycol  17 g Oral Daily   senna  2 tablet Oral Daily   traMADol  50 mg Oral Q12H   vitamin C  1,000 mg Oral Daily   Warfarin - Pharmacist Dosing Inpatient   Does not apply q1800   Continuous Infusions:  heparin 500 Units/hr (08/22/18 1948)    Procedures/Studies: Dg Thoracic Spine W/swimmers  Result Date: 08/18/2018 CLINICAL DATA:  Thoracic spine pain since lifting water a week ago. EXAM: THORACIC SPINE - 3 VIEWS COMPARISON:  CT chest 09/20/2012 and CT abdomen and pelvis 01/15/2015. FINDINGS: S shaped thoracolumbar scoliosis is identified. Remote T11 and T12 compression fractures are noted. No acute fracture. Mild multilevel degenerative change noted. IMPRESSION: No acute  finding. S shaped scoliosis. Mild, remote T11 and T12 compression fractures. Electronically Signed   By: Inge Rise M.D.   On: 08/18/2018 10:34   Dg Lumbar Spine Complete  Result Date: 08/18/2018 CLINICAL DATA:  Low back pain for 1 week radiating into both legs after lifting water. EXAM: LUMBAR SPINE - COMPLETE 4+ VIEW COMPARISON:  CT abdomen and pelvis 01/15/2015. FINDINGS: Convex left scoliosis is unchanged. The patient has remote T12 superior endplate and C62 inferior endplate compression fractures which are unchanged. Lower lumbar facet arthropathy is noted. Mild loss of disc space height is seen at L5-S1. Paraspinous structures demonstrate a stone in the lower pole of the left kidney, unchanged. Massive volume of stool throughout the colon is seen. IMPRESSION: No acute abnormality or change compared to the prior exam. Mild, remote T11 and T12 compression fractures. Lower lumbar spondylosis. Nonobstructing stone left kidney, unchanged. Very large volume of stool throughout the colon. Electronically Signed   By: Inge Rise M.D.   On: 08/18/2018 10:30   Mr Thoracic Spine Wo Contrast  Result Date: 08/19/2018 CLINICAL DATA:  Pre vertebroplasty. T12 fracture. Back pain 2 months EXAM: MRI THORACIC AND LUMBAR SPINE WITHOUT CONTRAST TECHNIQUE: Multiplanar and multiecho pulse sequences of the thoracic and lumbar spine were obtained without intravenous contrast. COMPARISON:  CT lumbar spine 08/18/2018 FINDINGS: MRI THORACIC SPINE FINDINGS Alignment: Mild anterolisthesis C7-T1, T2-3, T3-4. Mild retrolisthesis T11-12 Vertebrae: Mild to moderate compression fracture T12 with diffuse bone marrow edema. Paraspinous edema bilaterally compatible with recent fracture. Mild retropulsion of the superior endplate of B76 into the canal without significant stenosis. Cord: Negative for cord compression. Normal cord signal without cord lesion. Paraspinal and other soft tissues: Paraspinous soft tissue swelling around  the T12 fracture bilaterally. No mass lesion. Disc levels: Advanced disc degeneration throughout the entire lumbar spine with disc space narrowing and spurring diffusely. Arthrodesis at T7-8. No cord compression or significant spinal stenosis. Mild foraminal narrowing bilaterally at multiple levels due to spurring. MRI LUMBAR SPINE FINDINGS Segmentation:  Normal Alignment:  Mild retrolisthesis L1-2 and L2-3 Vertebrae: Mild to moderate fracture of T12 which appears acute or subacute. No lumbar fracture identified. Conus medullaris and cauda equina: Conus extends to the L1 level. Conus and cauda equina appear normal. Paraspinal and other soft tissues: Negative for paraspinous mass or edema Disc levels: L1-2: Mild disc degeneration with Schmorl's node L2-3: Disc degeneration with Schmorl's node. Mild disc bulging and endplate spurring without significant stenosis L2-3: Mild disc and moderate facet degeneration. Negative for stenosis L4-5: Diffuse bulging of the disc with moderate facet degeneration. Negative for stenosis L5-S1: Disc degeneration and spurring left greater than right with left foraminal encroachment. Bilateral facet hypertrophy. IMPRESSION: MR THORACIC SPINE IMPRESSION Mild to moderate compression fracture T12 with  bone marrow edema and paraspinous soft tissue edema compatible with acute or subacute fracture. Mild retropulsion of T12 into the canal without significant spinal stenosis. Severe diffuse thoracic disc degeneration without significant spinal stenosis. MR LUMBAR SPINE IMPRESSION Negative for lumbar fracture. Multilevel degenerative change as above without significant spinal stenosis. Electronically Signed   By: Franchot Gallo M.D.   On: 08/19/2018 10:53   Mr Lumbar Spine Wo Contrast  Result Date: 08/19/2018 CLINICAL DATA:  Pre vertebroplasty. T12 fracture. Back pain 2 months EXAM: MRI THORACIC AND LUMBAR SPINE WITHOUT CONTRAST TECHNIQUE: Multiplanar and multiecho pulse sequences of the  thoracic and lumbar spine were obtained without intravenous contrast. COMPARISON:  CT lumbar spine 08/18/2018 FINDINGS: MRI THORACIC SPINE FINDINGS Alignment: Mild anterolisthesis C7-T1, T2-3, T3-4. Mild retrolisthesis T11-12 Vertebrae: Mild to moderate compression fracture T12 with diffuse bone marrow edema. Paraspinous edema bilaterally compatible with recent fracture. Mild retropulsion of the superior endplate of S28 into the canal without significant stenosis. Cord: Negative for cord compression. Normal cord signal without cord lesion. Paraspinal and other soft tissues: Paraspinous soft tissue swelling around the T12 fracture bilaterally. No mass lesion. Disc levels: Advanced disc degeneration throughout the entire lumbar spine with disc space narrowing and spurring diffusely. Arthrodesis at T7-8. No cord compression or significant spinal stenosis. Mild foraminal narrowing bilaterally at multiple levels due to spurring. MRI LUMBAR SPINE FINDINGS Segmentation:  Normal Alignment:  Mild retrolisthesis L1-2 and L2-3 Vertebrae: Mild to moderate fracture of T12 which appears acute or subacute. No lumbar fracture identified. Conus medullaris and cauda equina: Conus extends to the L1 level. Conus and cauda equina appear normal. Paraspinal and other soft tissues: Negative for paraspinous mass or edema Disc levels: L1-2: Mild disc degeneration with Schmorl's node L2-3: Disc degeneration with Schmorl's node. Mild disc bulging and endplate spurring without significant stenosis L2-3: Mild disc and moderate facet degeneration. Negative for stenosis L4-5: Diffuse bulging of the disc with moderate facet degeneration. Negative for stenosis L5-S1: Disc degeneration and spurring left greater than right with left foraminal encroachment. Bilateral facet hypertrophy. IMPRESSION: MR THORACIC SPINE IMPRESSION Mild to moderate compression fracture T12 with bone marrow edema and paraspinous soft tissue edema compatible with acute or  subacute fracture. Mild retropulsion of T12 into the canal without significant spinal stenosis. Severe diffuse thoracic disc degeneration without significant spinal stenosis. MR LUMBAR SPINE IMPRESSION Negative for lumbar fracture. Multilevel degenerative change as above without significant spinal stenosis. Electronically Signed   By: Franchot Gallo M.D.   On: 08/19/2018 10:53   Ct Abdomen Pelvis W Contrast  Result Date: 08/18/2018 CLINICAL DATA:  Right greater than left lower abdominal pain. EXAM: CT ABDOMEN AND PELVIS WITH CONTRAST TECHNIQUE: Multidetector CT imaging of the abdomen and pelvis was performed using the standard protocol following bolus administration of intravenous contrast. CONTRAST:  42mL OMNIPAQUE IOHEXOL 300 MG/ML  SOLN COMPARISON:  MRI abdomen dated March 26, 2018. CT abdomen pelvis dated January 15, 2015. FINDINGS: Lower chest: No acute abnormality. Hepatobiliary: Multiple hepatic simple cysts and small hemangiomas are unchanged. Unchanged small gallbladder polyp versus gallstone. No gallbladder wall thickening. Unchanged mild common bile duct and central intrahepatic biliary dilatation. Pancreas: Unremarkable. No pancreatic ductal dilatation or surrounding inflammatory changes. Spleen: Normal in size without focal abnormality. Adrenals/Urinary Tract: The adrenal glands are unremarkable. There is a 1.2 cm exophytic, high-attenuating lesion arising from the midpole of the right kidney, previously characterized as a hemorrhagic/proteinaceous cyst on prior MRI. Subcentimeter low-density lesions in both kidneys remain too small to characterize, but  are unchanged. Slight interval enlargement of the nonobstructive calculi in the lower pole of the left kidney and midpole of the right kidney. Decreased mild left hydronephrosis to the level of the UPJ. No right hydronephrosis. The bladder is unremarkable for the degree of distention. Stomach/Bowel: Stomach is within normal limits. Appendix is  surgically absent. No evidence of bowel wall thickening, distention, or inflammatory changes. Moderate left-sided colonic diverticulosis. Large colonic stool burden. Vascular/Lymphatic: Aortic atherosclerosis. No enlarged abdominal or pelvic lymph nodes. Reproductive: Status post hysterectomy. No adnexal masses. Other: No abdominal wall hernia or abnormality. No abdominopelvic ascites. No pneumoperitoneum. Musculoskeletal: No acute or significant osseous findings. Slight interval progression of the now moderate T12 compression deformity since November 2019. IMPRESSION: 1.  No acute intra-abdominal process. 2. Prominent stool throughout the colon. Correlate for constipation. 3. Slight interval increase in size of bilateral nonobstructive nephrolithiasis. 4. Slight interval progression of the now moderate T12 compression deformity since November 2019. Electronically Signed   By: Titus Dubin M.D.   On: 08/18/2018 15:22    Orson Eva, DO  [facility] Pager [phone]  If 7PM-7AM, please contact night-coverage [email or web address] Password [email or web address] 08/23/2018, 12:41 PM   LOS: 4 days

## 2018-08-23 NOTE — Progress Notes (Signed)
ANTICOAGULATION CONSULT NOTE - Follow Up Consult  Pharmacy Consult for Heparin (warfarin on hold) Indication: multiple ischemic CVAs   Allergies  Allergen Reactions  . Epinephrine Anaphylaxis  . Demerol [Meperidine] Other (See Comments)    headaches  . Vicodin [Hydrocodone-Acetaminophen] Other (See Comments)    [name] it makes her crazy. Pt tolerates acetaminophen    Patient Measurements: Height: 5\' 2"  (157.5 cm) Weight: 139 lb 1.8 oz (63.1 kg) IBW/kg (Calculated) : 50.1  HEPARIN DW (KG): 59.7   Temp: 97.7 F (36.5 C) (03/30 0540) Temp Source: Oral (03/30 0540) BP: 162/70 (03/30 0540) Pulse Rate: 59 (03/30 0540)Labs: Recent Labs    08/21/18 0607 08/21/18 1251  08/22/18 0447 08/22/18 0912 08/22/18 1640 08/23/18 0423  HGB 12.9  --   --  12.6  --   --  12.7  HCT 40.2  --   --  39.6  --   --  40.5  PLT 266  --   --  284  --   --  290  APTT  --  >200*  --   --   --   --   --   LABPROT 15.5*  --   --  15.1  --   --   --   INR 1.2  --   --  1.2  --   --   --   HEPARINUNFRC  --  1.50*   < >  --  1.26* 0.57 0.54   < > = values in this interval not displayed.    Estimated Creatinine Clearance: 33.2 mL/min (A) (by C-G formula based on SCr of 1.16 mg/dL (H)).  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 Patient was found to have a T12 compression fracture and warfarin is now on hold for possible intervention in the near future. Using heparin while warfarin is on hold. INR 1.2. Pt is scheduled for T12 KP 3/30 at [facility]  Patient is very sensitive to heparin.  Heparin level remains within desired therapeutic goal range. F/U plan Goal of Therapy:  Heparin level 0.3--0.7 IU/mL Monitor platelets by anticoagulation protocol: Yes   Plan: Continue heparin at 500 units/hr   Daily CBC/HL Monitor for bleeding  Isac Sarna, BS Vena Austria, BCPS Clinical Pharmacist Pager [phone] 08/23/2018 10:12  AM

## 2018-08-23 NOTE — Progress Notes (Signed)
Patient ID: [name], female   DOB: 1936/01/14, 83 y.o.   MRN: [id number]  Pt is scheduled for T12 KP today at [facility]  To be at [facility] via ambulance by 1100 am DC Hep when ambulance comes to get her  We will consent pt at [facility] She will return to [facility] after procedure  RN aware of plan

## 2018-08-24 ENCOUNTER — Inpatient Hospital Stay
Admission: RE | Admit: 2018-08-24 | Discharge: 2018-09-10 | Disposition: A | Discharge status: Home / Self Care | Payer: PPO | Source: Ambulatory Visit | Attending: Internal Medicine | Admitting: Internal Medicine

## 2018-08-24 DIAGNOSIS — F329 Major depressive disorder, single episode, unspecified: Secondary | ICD-10-CM | POA: Diagnosis not present

## 2018-08-24 DIAGNOSIS — S22080G Wedge compression fracture of T11-T12 vertebra, subsequent encounter for fracture with delayed healing: Secondary | ICD-10-CM | POA: Diagnosis not present

## 2018-08-24 DIAGNOSIS — J42 Unspecified chronic bronchitis: Secondary | ICD-10-CM | POA: Diagnosis not present

## 2018-08-24 DIAGNOSIS — E039 Hypothyroidism, unspecified: Secondary | ICD-10-CM | POA: Diagnosis not present

## 2018-08-24 DIAGNOSIS — S22088D Other fracture of T11-T12 vertebra, subsequent encounter for fracture with routine healing: Secondary | ICD-10-CM | POA: Diagnosis not present

## 2018-08-24 DIAGNOSIS — F339 Major depressive disorder, recurrent, unspecified: Secondary | ICD-10-CM | POA: Diagnosis not present

## 2018-08-24 DIAGNOSIS — K219 Gastro-esophageal reflux disease without esophagitis: Secondary | ICD-10-CM | POA: Diagnosis not present

## 2018-08-24 DIAGNOSIS — S22080D Wedge compression fracture of T11-T12 vertebra, subsequent encounter for fracture with routine healing: Secondary | ICD-10-CM | POA: Diagnosis not present

## 2018-08-24 DIAGNOSIS — I130 Hypertensive heart and chronic kidney disease with heart failure and stage 1 through stage 4 chronic kidney disease, or unspecified chronic kidney disease: Secondary | ICD-10-CM | POA: Diagnosis not present

## 2018-08-24 DIAGNOSIS — F419 Anxiety disorder, unspecified: Secondary | ICD-10-CM | POA: Diagnosis not present

## 2018-08-24 DIAGNOSIS — Z741 Need for assistance with personal care: Secondary | ICD-10-CM | POA: Diagnosis not present

## 2018-08-24 DIAGNOSIS — K5900 Constipation, unspecified: Secondary | ICD-10-CM | POA: Diagnosis not present

## 2018-08-24 DIAGNOSIS — M6281 Muscle weakness (generalized): Secondary | ICD-10-CM | POA: Diagnosis not present

## 2018-08-24 DIAGNOSIS — S22080A Wedge compression fracture of T11-T12 vertebra, initial encounter for closed fracture: Secondary | ICD-10-CM | POA: Diagnosis not present

## 2018-08-24 DIAGNOSIS — I10 Essential (primary) hypertension: Secondary | ICD-10-CM | POA: Diagnosis not present

## 2018-08-24 DIAGNOSIS — Z8673 Personal history of transient ischemic attack (TIA), and cerebral infarction without residual deficits: Secondary | ICD-10-CM | POA: Diagnosis not present

## 2018-08-24 DIAGNOSIS — N183 Chronic kidney disease, stage 3 (moderate): Secondary | ICD-10-CM | POA: Diagnosis not present

## 2018-08-24 DIAGNOSIS — Z4789 Encounter for other orthopedic aftercare: Secondary | ICD-10-CM | POA: Diagnosis not present

## 2018-08-24 DIAGNOSIS — R262 Difficulty in walking, not elsewhere classified: Secondary | ICD-10-CM | POA: Diagnosis not present

## 2018-08-24 DIAGNOSIS — R52 Pain, unspecified: Secondary | ICD-10-CM | POA: Diagnosis not present

## 2018-08-24 DIAGNOSIS — I5032 Chronic diastolic (congestive) heart failure: Secondary | ICD-10-CM | POA: Diagnosis not present

## 2018-08-24 DIAGNOSIS — M545 Low back pain: Secondary | ICD-10-CM | POA: Diagnosis not present

## 2018-08-24 DIAGNOSIS — Z7901 Long term (current) use of anticoagulants: Secondary | ICD-10-CM | POA: Diagnosis not present

## 2018-08-24 DIAGNOSIS — K5909 Other constipation: Secondary | ICD-10-CM | POA: Diagnosis not present

## 2018-08-24 LAB — CBC
HCT: 41.2 % (ref 36.0–46.0)
Hemoglobin: 13.5 g/dL (ref 12.0–15.0)
MCH: 29.1 pg (ref 26.0–34.0)
MCHC: 32.8 g/dL (ref 30.0–36.0)
MCV: 88.8 fL (ref 80.0–100.0)
PLATELETS: 284 10*3/uL (ref 150–400)
RBC: 4.64 MIL/uL (ref 3.87–5.11)
RDW: 13.7 % (ref 11.5–15.5)
WBC: 6.7 10*3/uL (ref 4.0–10.5)
nRBC: 0 % (ref 0.0–0.2)

## 2018-08-24 LAB — HEPARIN LEVEL (UNFRACTIONATED): HEPARIN UNFRACTIONATED: 0.26 [IU]/mL — AB (ref 0.30–0.70)

## 2018-08-24 MED ORDER — METHOCARBAMOL 500 MG PO TABS: 500.0000 mg | ORAL_TABLET | Freq: Three times a day (TID) | ORAL | 0 refills | Status: DC

## 2018-08-24 MED ORDER — POLYETHYLENE GLYCOL 3350 17 G PO PACK: 17.0000 g | PACK | Freq: Every day | ORAL | 0 refills | Status: DC

## 2018-08-24 MED ORDER — OXYCODONE HCL 5 MG PO TABS: 5.0000 mg | ORAL_TABLET | ORAL | 0 refills | Status: DC | PRN

## 2018-08-24 MED ORDER — TRAMADOL HCL 50 MG PO TABS: 50.0000 mg | ORAL_TABLET | Freq: Two times a day (BID) | ORAL | 0 refills | Status: DC

## 2018-08-24 MED ORDER — DIAZEPAM 2 MG PO TABS
2.0000 mg | ORAL_TABLET | Freq: Once | ORAL | Status: AC
  Administered 2018-08-24: 2 mg via ORAL
  Filled 2018-08-24: qty 1

## 2018-08-24 MED ORDER — DIAZEPAM 2 MG PO TABS: 2.0000 mg | ORAL_TABLET | Freq: Three times a day (TID) | ORAL | 0 refills | Status: DC | PRN

## 2018-08-24 MED ORDER — ENOXAPARIN SODIUM 60 MG/0.6ML ~~LOC~~ SOLN
60.0000 mg | SUBCUTANEOUS | Status: DC
  Administered 2018-08-24: 60 mg via SUBCUTANEOUS
  Filled 2018-08-24: qty 0.6

## 2018-08-24 MED ORDER — SENNA 8.6 MG PO TABS: 2.0000 | ORAL_TABLET | Freq: Every day | ORAL | 0 refills | Status: DC

## 2018-08-24 MED ORDER — DIAZEPAM 2 MG PO TABS: 2.0000 mg | ORAL_TABLET | Freq: Three times a day (TID) | ORAL | Status: DC | PRN

## 2018-08-24 MED ORDER — WARFARIN SODIUM 2 MG PO TABS
4.0000 mg | ORAL_TABLET | Freq: Once | ORAL | Status: AC
  Administered 2018-08-24: 4 mg via ORAL
  Filled 2018-08-24: qty 2

## 2018-08-24 MED ORDER — ENOXAPARIN SODIUM 60 MG/0.6ML ~~LOC~~ SOLN: 60.0000 mg | SUBCUTANEOUS | 0 refills | Status: DC

## 2018-08-24 NOTE — Progress Notes (Signed)
ANTICOAGULATION CONSULT NOTE - Follow Up Consult  Pharmacy Consult for warfarin/lovenox Indication: multiple ischemic CVAs   Allergies  Allergen Reactions  . Epinephrine Anaphylaxis  . Demerol [Meperidine] Other (See Comments)    headaches  . Vicodin [Hydrocodone-Acetaminophen] Other (See Comments)    [name] it makes her crazy. Pt tolerates acetaminophen    Patient Measurements: Height: 5\' 2"  (157.5 cm) Weight: 136 lb 11 oz (62 kg) IBW/kg (Calculated) : 50.1  HEPARIN DW (KG): 59.7   Temp: 98.2 F (36.8 C) (03/31 0550) Temp Source: Oral (03/31 0550) BP: 152/67 (03/31 0550) Pulse Rate: 55 (03/31 0550)Labs: Recent Labs    08/22/18 0447  08/22/18 1640 08/23/18 0423 08/24/18 0440  HGB 12.6  --   --  12.7 13.5  HCT 39.6  --   --  40.5 41.2  PLT 284  --   --  290 284  LABPROT 15.1  --   --   --   --   INR 1.2  --   --   --   --   HEPARINUNFRC  --    < > 0.57 0.54 0.26*   < > = values in this interval not displayed.    Estimated Creatinine Clearance: 33 mL/min (A) (by C-G formula based on SCr of 1.16 mg/dL (H)).  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 Patient was found to have a T12 compression fracture . Pt T12 KP 3/30 at [facility]    F/U plan Goal of Therapy:  INR 2-3 Monitor platelets by anticoagulation protocol: Yes   Plan: Stop heparin and start lovenox upon discontinuation. Warfarin 4 mg daily Lovenox 60 mg daily until INR therapeutic for 24 hours. Daily CBC/HL   Margot Ables, PharmD Clinical Pharmacist 08/24/2018 12:55 PM

## 2018-08-24 NOTE — Progress Notes (Signed)
PT Cancellation Note  Patient Details Name: [name] MRN: [id number] DOB: 1935/09/08   Cancelled Treatment:    Reason Eval/Treat Not Completed: Pain limiting ability to participate  Attempted PT session, pt supine in bed and reports spasms in back and pain scale 50/10.  Reports of increased pain with all movements and does not wish to complete therapy currently.  RN aware of pain for possible medication assistance if able.  Will attempted PT session later if available.  [address] LPTA; [place]  Aldona Lento 08/24/2018, 9:26 AM

## 2018-08-24 NOTE — Progress Notes (Signed)
Physical Therapy Treatment Patient Details Name: [name] MRN: [id number] DOB: 1935-09-23 Today's Date: 08/24/2018    History of Present Illness [name] is a 83 y.o. female with medical history significant for multiple acute ischemic CVAs prompting chronic anticoagulation on warfarin, chronic abdominal pain, acquired hypothyroidism, hypertension, chronic diastolic heart failure, who is admitted to [facility] on 08/18/2018 for pain control in setting of progression of T12 compression fracture at presenting from home to [facility] ED complaining of low back pain.     PT Comments    Patient is now S/P kyphoplasty on 08/23/18. Spoke to RN before treatment session and she stated she was giving the patient pain medication. Therapist found patient awake and alert sitting up in the bedside chair. She reported feeling limited by pain, however consented to walk with physical therapy. Patient performed sit to stand and stand pivot transfer with RW and min guard. Patient then ambulated 50 feet with frequent pauses with RW due to pain. Patient continued to demonstrate walking with the right toe pointed at times and occasional right knee buckling. Patient returned to bedside chair due to fatigue and pain. Therapist brought call bell within patient's reach and chair alarm on and all needs met. Patient would continue to benefit from skilled physical therapy in order for patient to improve, transfers, gait, and overall functional independence while patient is in the hospital and at [facility] following discharge.    Follow Up Recommendations  SNF;Supervision - Intermittent     Equipment Recommendations  Rolling walker with 5" wheels    Recommendations for Other Services       Precautions / Restrictions Precautions Precautions: Fall Precaution Comments: T12 compression fx; S/P Kyphoplasty on 08/23/18 Restrictions Weight Bearing Restrictions: No    Mobility  Bed Mobility                   Transfers Overall transfer level: Needs assistance Equipment used: Rolling walker (2 wheeled) Transfers: Sit to/from [facility] Sit to Stand: Supervision Stand pivot transfers: Min guard       General transfer comment: increased time, c/o increased right hip/right sided back pain during stand to sitting when flexing trunk  Ambulation/Gait Ambulation/Gait assistance: Min guard;Min assist Gait Distance (Feet): 50 Feet Assistive device: Rolling walker (2 wheeled) Gait Pattern/deviations: Decreased step length - right;Decreased step length - left;Decreased stride length Gait velocity: decreased   General Gait Details: slow labored cadence with occasional stopping with guarding of right side of low back due to pain with radiation to right hip, occasional buckling of righ knee; difficulty placing RLE fully on ground and tended to toe walk on RLE due to pain   Stairs             Wheelchair Mobility    Modified Rankin (Stroke Patients Only)       Balance Overall balance assessment: Needs assistance Sitting-balance support: Feet supported;No upper extremity supported Sitting balance-Leahy Scale: Fair Sitting balance - Comments: fair/good when supporting self BLE   Standing balance support: During functional activity;Bilateral upper extremity supported Standing balance-Leahy Scale: Fair Standing balance comment: using RW                            Cognition Arousal/Alertness: Awake/alert Behavior During Therapy: WFL for tasks assessed/performed Overall Cognitive Status: Within Functional Limits for tasks assessed  Exercises      General Comments        Pertinent Vitals/Pain Pain Assessment: 0-10 Pain Score: 6  Pain Location: Back pain on the right more than left Pain Descriptors / Indicators: Sharp;Spasm Pain Intervention(s): Limited activity within patient's  tolerance;Monitored during session;Premedicated before session    Home Living                      Prior Function            PT Goals (current goals can now be found in the care plan section) Acute Rehab PT Goals Patient Stated Goal: return home after rehab PT Goal Formulation: With patient Time For Goal Achievement: 09/02/18 Potential to Achieve Goals: Good Progress towards PT goals: Progressing toward goals    Frequency    Min 3X/week      PT Plan Current plan remains appropriate    Co-evaluation              AM-PAC PT "6 Clicks" Mobility   Outcome Measure  Help needed turning from your back to your side while in a flat bed without using bedrails?: A Little Help needed moving from lying on your back to sitting on the side of a flat bed without using bedrails?: A Little Help needed moving to and from a bed to a chair (including a wheelchair)?: A Little Help needed standing up from a chair using your arms (e.g., wheelchair or bedside chair)?: A Little Help needed to walk in hospital room?: A Lot Help needed climbing 3-5 steps with a railing? : A Lot 6 Click Score: 16    End of Session Equipment Utilized During Treatment: Gait belt Activity Tolerance: Patient tolerated treatment well;Patient limited by fatigue;Patient limited by pain Patient left: with call bell/phone within reach;in chair;with chair alarm set Nurse Communication: Mobility status PT Visit Diagnosis: Unsteadiness on feet (R26.81);Other abnormalities of gait and mobility (R26.89);Muscle weakness (generalized) (M62.81)     Time: 4417-1278 PT Time Calculation (min) (ACUTE ONLY): 20 min  Charges:  $Therapeutic Activity: 8-22 mins                    Clarene Critchley PT, DPT 12:39 PM, 08/24/18 [phone]

## 2018-08-24 NOTE — Progress Notes (Signed)
Report for dc to SNF  - [facility] given to [employer]. LPN w/ FU appointment, med changes, diet, activity level and vitals. All questions answered. No distress noted. IV dc'd, tele dc'd. Will continue to monitor until dc. Await pickup from [facility] transporter.

## 2018-08-24 NOTE — Progress Notes (Signed)
Pt up in chair, eating lunch, no distress noted. No c/o spasms or back pain. Tolerating movement well. Valium given as ordered.

## 2018-08-24 NOTE — Discharge Summary (Addendum)
Physician Discharge Summary  [name]:[id number] DOB: 1935-09-24 DOA: 08/18/2018  PCP: Celene Squibb, MD  Admit date: 08/18/2018 Discharge date: 08/24/2018  Admitted From: Home Disposition:  SNF  Recommendations for Outpatient Follow-up:  1. Follow up with PCP in 1-2 weeks 2. Please obtain daily INR and adjust warfarin for INR >2.0 3. Please discontinue lovenox when INR >2.0 4. Pt restarted on home dose warfarin (4 mg daily) on 08/24/18   Discharge Condition: Stable CODE STATUS: FULL Diet recommendation: Heart Healthy   Brief/Interim Summary: 83 year old female with a history of ischemic CVAs in the past resulting in warfarin anticoagulation, chronic abdominal pain, hypertension, hypothyroidism presenting with worsening back pain over 1 week. The patient states that she was at the grocery store getting a pack of bottled water. After lifting the water and transferring her to her grocery cart, the patient had immediate increase in her lower back pain. Fortunately, the patient was able to subsequently drive home, and she even transferred the package of water onto a dolly and back into her house. Over the next1 to 2 days, the patient related increasing lower back pain. She stated that in the past week she has visited her primary care provider twice. She stated that no radiographs were performed, but she was started on some type of muscle relaxer. She thinks that it may have been cyclobenzaprine. She stated that it did not help much. Because of increasing pain, this has limited the patient's ability to perform her usual ADLs. Fortunately, the patient is still able to make transfers and get to the bathroom albeit with significant pain and taking a significant amount of time. She denies any recent additional falls or trauma. She denies any saddle anesthesia, focal unilateral leg or arm weakness, bowel or bladder incontinence. She denies any fevers or chills. The patient  states that she is only been using acetaminophen around-the-clock. She does not want to use any "strong pain medicine"that would cause her to have somnolence. She also complains of constipation with her last bowel movement on 08/17/2018. Normally she has a bowel movement almost daily. Because of worsening pain, the patient presented to emergency department for further evaluation. CT of the abdomen and pelvis showed slight interval progression of her T12 compression deformity since 03/2018. The case was discussed with IR whom recommended MRI of the thoracic spine to see if the patient may be a candidate for kyphoplasty.  After MRI was reviewed with IR, the patient ultimate underwent kyphoplasty of T12 on 08/23/18.  She continued to significant muscle spasms for which valium 2 mg prn was added.  This significantly improved her muscle spasms and anxiety to the point where pt was able to work with PT again.  Discharge Diagnoses:  Intractable back pain/T12 compression fracture -The patient wants to avoid"strong pain medicine" -continuetramadol q 12 hours -continueRobaxin q 8 hours -PT evaluation-->SNF -MRI thoracic spine--Mild to moderate compression fracture T12 with bone marrow edema and paraspinous soft tissue edema compatible with acute or subacute fracture -continue oxy IR for breakthrough pain -case discussed with IR, Dr. Ulice Dash Watts--> kyphoplasty on 08/23/18 -IV heparin was restarted ~6 hrs after the procedure without complications -restart warfarin with lovenox bridge -patient developed more muscle spasms resulting in pain after kyphoplasty-->added diazepam low dose for anxiety and as muscle relaxer  -d/c lovenox when INR >2.0  Constipation/chronic abdominal pain -08/18/2018 CT abdomen pelvis--unchanged hepatic cyst, unchangedGBpolyp versus stone with unchanged mild CBD and central intrahepatic biliary ductal dilatation;prominent colonic stool;decreased mild left hydronephrosis  to the  level of UPJ -StartedMiraLAX daily -StartedSenokot  History Ischemic CVAs -holding warfarin for now in anticipation should pt require intervention for T12 compression fracture -startedIV heparin as INR subtherapeutic -d/c with lovenox bridge until INR is therapeutic (>2)  Essential hypertension -BP elevated in part due to pain -Continue amlodipine and metoprolol tartrate  Hypothyroidism -Continue Synthroid  CKD stage III -Baseline creatinine 0.9-1.1  Depression/anxiety -Continue Wellbutrin    Discharge Instructions   Allergies as of 08/24/2018      Reactions   Epinephrine Anaphylaxis   Demerol [meperidine] Other (See Comments)   headaches   Vicodin [hydrocodone-acetaminophen] Other (See Comments)   Said it makes her crazy. Pt tolerates acetaminophen      Medication List    TAKE these medications   albuterol (2.5 MG/3ML) 0.083% nebulizer solution Commonly known as:  PROVENTIL Take 3 mLs (2.5 mg total) by nebulization every 6 (six) hours as needed for wheezing or shortness of breath.   amLODipine 5 MG tablet Commonly known as:  NORVASC TAKE 1 TABLET BY MOUTH EVERY DAY   Biotin 300 MCG Tabs Take 1 tablet by mouth daily after supper.   buPROPion 150 MG 24 hr tablet Commonly known as:  WELLBUTRIN XL Take 150 mg by mouth daily.   CALCIUM CARBONATE PO Take 1 tablet by mouth daily. Patient states medication is calcium 1200 mg without vitamin D.   cholecalciferol 1000 units tablet Commonly known as:  VITAMIN D Take 1,000 Units by mouth daily.   Compressor/Nebulizer Misc Use as directed   CYANOCOBALAMIN PO Take 1 tablet by mouth daily. Patient unsure of strength   diazepam 2 MG tablet Commonly known as:  VALIUM Take 1 tablet (2 mg total) by mouth every 8 (eight) hours as needed for anxiety or muscle spasms.   enoxaparin 60 MG/0.6ML injection Commonly known as:  LOVENOX Inject 0.6 mLs (60 mg total) into the skin daily.   ezetimibe 10 MG  tablet Commonly known as:  ZETIA Take 10 mg by mouth daily.   Fish Oil 1200 MG Caps Take 1,200 mg by mouth daily.   Flaxseed Oil 1000 MG Caps Take 1 capsule by mouth.   furosemide 20 MG tablet Commonly known as:  LASIX Take 20 mg by mouth daily after supper. What changed:  Another medication with the same name was changed. Make sure you understand how and when to take each.   furosemide 40 MG tablet Commonly known as:  LASIX TAKE 1 TABLET BY MOUTH TWICE DAILY What changed:    how to take this  when to take this   Iron 240 (27 Fe) MG Tabs Take 1 tablet by mouth daily.   Lecithin 1200 MG Caps Take 1 capsule by mouth daily.   levothyroxine 75 MCG tablet Commonly known as:  SYNTHROID, LEVOTHROID Take 50 mcg by mouth daily.   LUTEIN PO Take 1 capsule by mouth daily. Strength varies based on what is purchased at the time.   Magnesium 250 MG Tabs Take 1 tablet by mouth daily.   methocarbamol 500 MG tablet Commonly known as:  ROBAXIN Take 1 tablet (500 mg total) by mouth 3 (three) times daily. X 5 days   metoprolol tartrate 25 MG tablet Commonly known as:  LOPRESSOR TAKE 1 TABLET BY MOUTH TWICE DAILY What changed:    how much to take  how to take this   oxyCODONE 5 MG immediate release tablet Commonly known as:  Oxy IR/ROXICODONE Take 1 tablet (5 mg total) by mouth  every 4 (four) hours as needed for severe pain.   pantoprazole 40 MG tablet Commonly known as:  PROTONIX Take 1 tablet by mouth daily as needed.   polyethylene glycol packet Commonly known as:  MIRALAX / GLYCOLAX Take 17 g by mouth daily. Start taking on:  August 25, 2018   potassium chloride 10 MEQ tablet Commonly known as:  K-DUR Take 10 mEq by mouth 2 (two) times daily.   senna 8.6 MG Tabs tablet Commonly known as:  SENOKOT Take 2 tablets (17.2 mg total) by mouth daily. Start taking on:  August 25, 2018   traMADol 50 MG tablet Commonly known as:  ULTRAM Take 1 tablet (50 mg total) by  mouth every 12 (twelve) hours.   Turmeric 500 MG Tabs Take by mouth daily.   Vitamin B Complex Tabs Take 1 tablet by mouth daily.   vitamin C 1000 MG tablet Take 1,000 mg by mouth daily.   warfarin 4 MG tablet Commonly known as:  COUMADIN Take 4 mg by mouth daily.   Zinc 50 MG Tabs Take 1 tablet by mouth daily.       Allergies  Allergen Reactions   Epinephrine Anaphylaxis   Demerol [Meperidine] Other (See Comments)    headaches   Vicodin [Hydrocodone-Acetaminophen] Other (See Comments)    [name] it makes her crazy. Pt tolerates acetaminophen    Consultations:  IR   Procedures/Studies: Dg Thoracic Spine W/swimmers  Result Date: 08/18/2018 CLINICAL DATA:  Thoracic spine pain since lifting water a week ago. EXAM: THORACIC SPINE - 3 VIEWS COMPARISON:  CT chest 09/20/2012 and CT abdomen and pelvis 01/15/2015. FINDINGS: S shaped thoracolumbar scoliosis is identified. Remote T11 and T12 compression fractures are noted. No acute fracture. Mild multilevel degenerative change noted. IMPRESSION: No acute finding. S shaped scoliosis. Mild, remote T11 and T12 compression fractures. Electronically Signed   By: Inge Rise M.D.   On: 08/18/2018 10:34   Dg Lumbar Spine Complete  Result Date: 08/18/2018 CLINICAL DATA:  Low back pain for 1 week radiating into both legs after lifting water. EXAM: LUMBAR SPINE - COMPLETE 4+ VIEW COMPARISON:  CT abdomen and pelvis 01/15/2015. FINDINGS: Convex left scoliosis is unchanged. The patient has remote T12 superior endplate and E99 inferior endplate compression fractures which are unchanged. Lower lumbar facet arthropathy is noted. Mild loss of disc space height is seen at L5-S1. Paraspinous structures demonstrate a stone in the lower pole of the left kidney, unchanged. Massive volume of stool throughout the colon is seen. IMPRESSION: No acute abnormality or change compared to the prior exam. Mild, remote T11 and T12 compression fractures. Lower  lumbar spondylosis. Nonobstructing stone left kidney, unchanged. Very large volume of stool throughout the colon. Electronically Signed   By: Inge Rise M.D.   On: 08/18/2018 10:30   Mr Thoracic Spine Wo Contrast  Result Date: 08/19/2018 CLINICAL DATA:  Pre vertebroplasty. T12 fracture. Back pain 2 months EXAM: MRI THORACIC AND LUMBAR SPINE WITHOUT CONTRAST TECHNIQUE: Multiplanar and multiecho pulse sequences of the thoracic and lumbar spine were obtained without intravenous contrast. COMPARISON:  CT lumbar spine 08/18/2018 FINDINGS: MRI THORACIC SPINE FINDINGS Alignment: Mild anterolisthesis C7-T1, T2-3, T3-4. Mild retrolisthesis T11-12 Vertebrae: Mild to moderate compression fracture T12 with diffuse bone marrow edema. Paraspinous edema bilaterally compatible with recent fracture. Mild retropulsion of the superior endplate of B71 into the canal without significant stenosis. Cord: Negative for cord compression. Normal cord signal without cord lesion. Paraspinal and other soft tissues: Paraspinous soft tissue swelling  around the T12 fracture bilaterally. No mass lesion. Disc levels: Advanced disc degeneration throughout the entire lumbar spine with disc space narrowing and spurring diffusely. Arthrodesis at T7-8. No cord compression or significant spinal stenosis. Mild foraminal narrowing bilaterally at multiple levels due to spurring. MRI LUMBAR SPINE FINDINGS Segmentation:  Normal Alignment:  Mild retrolisthesis L1-2 and L2-3 Vertebrae: Mild to moderate fracture of T12 which appears acute or subacute. No lumbar fracture identified. Conus medullaris and cauda equina: Conus extends to the L1 level. Conus and cauda equina appear normal. Paraspinal and other soft tissues: Negative for paraspinous mass or edema Disc levels: L1-2: Mild disc degeneration with Schmorl's node L2-3: Disc degeneration with Schmorl's node. Mild disc bulging and endplate spurring without significant stenosis L2-3: Mild disc and  moderate facet degeneration. Negative for stenosis L4-5: Diffuse bulging of the disc with moderate facet degeneration. Negative for stenosis L5-S1: Disc degeneration and spurring left greater than right with left foraminal encroachment. Bilateral facet hypertrophy. IMPRESSION: MR THORACIC SPINE IMPRESSION Mild to moderate compression fracture T12 with bone marrow edema and paraspinous soft tissue edema compatible with acute or subacute fracture. Mild retropulsion of T12 into the canal without significant spinal stenosis. Severe diffuse thoracic disc degeneration without significant spinal stenosis. MR LUMBAR SPINE IMPRESSION Negative for lumbar fracture. Multilevel degenerative change as above without significant spinal stenosis. Electronically Signed   By: Franchot Gallo M.D.   On: 08/19/2018 10:53   Mr Lumbar Spine Wo Contrast  Result Date: 08/19/2018 CLINICAL DATA:  Pre vertebroplasty. T12 fracture. Back pain 2 months EXAM: MRI THORACIC AND LUMBAR SPINE WITHOUT CONTRAST TECHNIQUE: Multiplanar and multiecho pulse sequences of the thoracic and lumbar spine were obtained without intravenous contrast. COMPARISON:  CT lumbar spine 08/18/2018 FINDINGS: MRI THORACIC SPINE FINDINGS Alignment: Mild anterolisthesis C7-T1, T2-3, T3-4. Mild retrolisthesis T11-12 Vertebrae: Mild to moderate compression fracture T12 with diffuse bone marrow edema. Paraspinous edema bilaterally compatible with recent fracture. Mild retropulsion of the superior endplate of V67 into the canal without significant stenosis. Cord: Negative for cord compression. Normal cord signal without cord lesion. Paraspinal and other soft tissues: Paraspinous soft tissue swelling around the T12 fracture bilaterally. No mass lesion. Disc levels: Advanced disc degeneration throughout the entire lumbar spine with disc space narrowing and spurring diffusely. Arthrodesis at T7-8. No cord compression or significant spinal stenosis. Mild foraminal narrowing  bilaterally at multiple levels due to spurring. MRI LUMBAR SPINE FINDINGS Segmentation:  Normal Alignment:  Mild retrolisthesis L1-2 and L2-3 Vertebrae: Mild to moderate fracture of T12 which appears acute or subacute. No lumbar fracture identified. Conus medullaris and cauda equina: Conus extends to the L1 level. Conus and cauda equina appear normal. Paraspinal and other soft tissues: Negative for paraspinous mass or edema Disc levels: L1-2: Mild disc degeneration with Schmorl's node L2-3: Disc degeneration with Schmorl's node. Mild disc bulging and endplate spurring without significant stenosis L2-3: Mild disc and moderate facet degeneration. Negative for stenosis L4-5: Diffuse bulging of the disc with moderate facet degeneration. Negative for stenosis L5-S1: Disc degeneration and spurring left greater than right with left foraminal encroachment. Bilateral facet hypertrophy. IMPRESSION: MR THORACIC SPINE IMPRESSION Mild to moderate compression fracture T12 with bone marrow edema and paraspinous soft tissue edema compatible with acute or subacute fracture. Mild retropulsion of T12 into the canal without significant spinal stenosis. Severe diffuse thoracic disc degeneration without significant spinal stenosis. MR LUMBAR SPINE IMPRESSION Negative for lumbar fracture. Multilevel degenerative change as above without significant spinal stenosis. Electronically Signed   By: Juanda Crumble  Carlis Abbott M.D.   On: 08/19/2018 10:53   Ct Abdomen Pelvis W Contrast  Result Date: 08/18/2018 CLINICAL DATA:  Right greater than left lower abdominal pain. EXAM: CT ABDOMEN AND PELVIS WITH CONTRAST TECHNIQUE: Multidetector CT imaging of the abdomen and pelvis was performed using the standard protocol following bolus administration of intravenous contrast. CONTRAST:  1mL OMNIPAQUE IOHEXOL 300 MG/ML  SOLN COMPARISON:  MRI abdomen dated March 26, 2018. CT abdomen pelvis dated January 15, 2015. FINDINGS: Lower chest: No acute abnormality.  Hepatobiliary: Multiple hepatic simple cysts and small hemangiomas are unchanged. Unchanged small gallbladder polyp versus gallstone. No gallbladder wall thickening. Unchanged mild common bile duct and central intrahepatic biliary dilatation. Pancreas: Unremarkable. No pancreatic ductal dilatation or surrounding inflammatory changes. Spleen: Normal in size without focal abnormality. Adrenals/Urinary Tract: The adrenal glands are unremarkable. There is a 1.2 cm exophytic, high-attenuating lesion arising from the midpole of the right kidney, previously characterized as a hemorrhagic/proteinaceous cyst on prior MRI. Subcentimeter low-density lesions in both kidneys remain too small to characterize, but are unchanged. Slight interval enlargement of the nonobstructive calculi in the lower pole of the left kidney and midpole of the right kidney. Decreased mild left hydronephrosis to the level of the UPJ. No right hydronephrosis. The bladder is unremarkable for the degree of distention. Stomach/Bowel: Stomach is within normal limits. Appendix is surgically absent. No evidence of bowel wall thickening, distention, or inflammatory changes. Moderate left-sided colonic diverticulosis. Large colonic stool burden. Vascular/Lymphatic: Aortic atherosclerosis. No enlarged abdominal or pelvic lymph nodes. Reproductive: Status post hysterectomy. No adnexal masses. Other: No abdominal wall hernia or abnormality. No abdominopelvic ascites. No pneumoperitoneum. Musculoskeletal: No acute or significant osseous findings. Slight interval progression of the now moderate T12 compression deformity since November 2019. IMPRESSION: 1.  No acute intra-abdominal process. 2. Prominent stool throughout the colon. Correlate for constipation. 3. Slight interval increase in size of bilateral nonobstructive nephrolithiasis. 4. Slight interval progression of the now moderate T12 compression deformity since November 2019. Electronically Signed   By:  Titus Dubin M.D.   On: 08/18/2018 15:22   Ir Kypho Lumbar Inc Fx Reduce Bone Bx Uni/bil Cannulation Inc/imaging  Result Date: 08/23/2018 CLINICAL DATA:  Symptomatic compression fracture, failed conservative management. Please perform fluoroscopic guided kyphoplasty for symptomatic purposes. EXAM: FLUOROSCOPIC GUIDED KYPHOPLASTY OF THE T12 VERTEBRAL BODY COMPARISON:  Thoracic and lumbar spine MRI - 08/19/2018 MEDICATIONS: As antibiotic prophylaxis, Ancef 2 gm IV was ordered pre-procedure and administered intravenously within 1 hour of incision. ANESTHESIA/SEDATION: Moderate (conscious) sedation was employed during this procedure. A total of Versed 2 mg and Fentanyl 75 mcg was administered intravenously. Moderate Sedation Time: 27 minutes. The patient's level of consciousness and vital signs were monitored continuously by radiology nursing throughout the procedure under my direct supervision. FLUOROSCOPY TIME:  5 minutes, 48 seconds (892 mGy) COMPLICATIONS: None immediate. TECHNIQUE: The procedure, risks (including but not limited to bleeding, infection, organ damage), benefits, and alternatives were explained to the patient. Questions regarding the procedure were encouraged and answered. The patient understands and consents to the procedure. The patient was placed prone on the fluoroscopic table. The skin overlying the thoracolumbar region was then prepped and draped in the usual sterile fashion. Maximal barrier sterile technique was utilized including caps, mask, sterile gowns, sterile gloves, sterile drape, hand hygiene and skin antiseptic. Intravenous Fentanyl and Versed were administered as conscious sedation during continuous cardiorespiratory monitoring by the radiology RN. The left pedicle at T12 was then infiltrated with 1% lidocaine followed by the advancement  of a Kyphon trocar needle through the left pedicle into the posterior one-third of the vertebral body. Subsequently, the osteo drill was  advanced to the anterior third of the vertebral body. The osteo drill was retracted. Through the working cannula, a Kyphon inflatable bone tamp 15 x 2 was advanced and positioned with the distal marker approximately 5 mm from the anterior aspect of the cortex. Appropriate positioning was confirmed on the AP projection. At this time, the balloon was expanded using contrast via a Kyphon inflation syringe device via micro tubing. Inflations were continued until there was near apposition with the superior end plate. At this time, methylmethacrylate mixture was reconstituted in the Kyphon bone mixing device system. This was then loaded into the delivery mechanism, attached to Kyphon bone fillers. The balloons were deflated and removed followed by the instillation of methylmethacrylate mixture with excellent filling in the AP and lateral projections. No extravasation was noted in the disk spaces or posteriorly into the spinal canal. No epidural venous contamination was seen. The working cannulae and the bone filler were then retrieved and removed. Hemostasis was achieved with manual compression. The patient tolerated the procedure well without immediate postprocedural complication. IMPRESSION: 1. Technically successful T12 vertebral body augmentation using balloon kyphoplasty. 2. Per CMS PQRS reporting requirements (PQRS Measure 24): Given the patient's age of greater than 70 and the fracture site (hip, distal radius, or spine), the patient should be tested for osteoporosis using DXA, and the appropriate treatment considered based on the DXA results. Electronically Signed   By: Sandi Mariscal M.D.   On: 08/23/2018 15:56        Discharge Exam: Vitals:   08/24/18 0550 08/24/18 1300  BP: (!) 152/67 (!) 148/69  Pulse: (!) 55 61  Resp: 16 18  Temp: 98.2 F (36.8 C) 98.3 F (36.8 C)  SpO2: 96% 96%   Vitals:   08/23/18 2203 08/24/18 0500 08/24/18 0550 08/24/18 1300  BP: (!) 141/74  (!) 152/67 (!) 148/69  Pulse:  70  (!) 55 61  Resp: 20  16 18   Temp: 97.8 F (36.6 C)  98.2 F (36.8 C) 98.3 F (36.8 C)  TempSrc: Oral  Oral Oral  SpO2: 100%  96% 96%  Weight:  62 kg    Height:        General: Pt is alert, awake, not in acute distress Cardiovascular: RRR, S1/S2 +, no rubs, no gallops Respiratory: CTA bilaterally, no wheezing, no rhonchi Abdominal: Soft, NT, ND, bowel sounds + Extremities: no edema, no cyanosis   The results of significant diagnostics from this hospitalization (including imaging, microbiology, ancillary and laboratory) are listed below for reference.    Significant Diagnostic Studies: Dg Thoracic Spine W/swimmers  Result Date: 08/18/2018 CLINICAL DATA:  Thoracic spine pain since lifting water a week ago. EXAM: THORACIC SPINE - 3 VIEWS COMPARISON:  CT chest 09/20/2012 and CT abdomen and pelvis 01/15/2015. FINDINGS: S shaped thoracolumbar scoliosis is identified. Remote T11 and T12 compression fractures are noted. No acute fracture. Mild multilevel degenerative change noted. IMPRESSION: No acute finding. S shaped scoliosis. Mild, remote T11 and T12 compression fractures. Electronically Signed   By: Inge Rise M.D.   On: 08/18/2018 10:34   Dg Lumbar Spine Complete  Result Date: 08/18/2018 CLINICAL DATA:  Low back pain for 1 week radiating into both legs after lifting water. EXAM: LUMBAR SPINE - COMPLETE 4+ VIEW COMPARISON:  CT abdomen and pelvis 01/15/2015. FINDINGS: Convex left scoliosis is unchanged. The patient has remote T12 superior  endplate and G29 inferior endplate compression fractures which are unchanged. Lower lumbar facet arthropathy is noted. Mild loss of disc space height is seen at L5-S1. Paraspinous structures demonstrate a stone in the lower pole of the left kidney, unchanged. Massive volume of stool throughout the colon is seen. IMPRESSION: No acute abnormality or change compared to the prior exam. Mild, remote T11 and T12 compression fractures. Lower lumbar  spondylosis. Nonobstructing stone left kidney, unchanged. Very large volume of stool throughout the colon. Electronically Signed   By: Inge Rise M.D.   On: 08/18/2018 10:30   Mr Thoracic Spine Wo Contrast  Result Date: 08/19/2018 CLINICAL DATA:  Pre vertebroplasty. T12 fracture. Back pain 2 months EXAM: MRI THORACIC AND LUMBAR SPINE WITHOUT CONTRAST TECHNIQUE: Multiplanar and multiecho pulse sequences of the thoracic and lumbar spine were obtained without intravenous contrast. COMPARISON:  CT lumbar spine 08/18/2018 FINDINGS: MRI THORACIC SPINE FINDINGS Alignment: Mild anterolisthesis C7-T1, T2-3, T3-4. Mild retrolisthesis T11-12 Vertebrae: Mild to moderate compression fracture T12 with diffuse bone marrow edema. Paraspinous edema bilaterally compatible with recent fracture. Mild retropulsion of the superior endplate of B28 into the canal without significant stenosis. Cord: Negative for cord compression. Normal cord signal without cord lesion. Paraspinal and other soft tissues: Paraspinous soft tissue swelling around the T12 fracture bilaterally. No mass lesion. Disc levels: Advanced disc degeneration throughout the entire lumbar spine with disc space narrowing and spurring diffusely. Arthrodesis at T7-8. No cord compression or significant spinal stenosis. Mild foraminal narrowing bilaterally at multiple levels due to spurring. MRI LUMBAR SPINE FINDINGS Segmentation:  Normal Alignment:  Mild retrolisthesis L1-2 and L2-3 Vertebrae: Mild to moderate fracture of T12 which appears acute or subacute. No lumbar fracture identified. Conus medullaris and cauda equina: Conus extends to the L1 level. Conus and cauda equina appear normal. Paraspinal and other soft tissues: Negative for paraspinous mass or edema Disc levels: L1-2: Mild disc degeneration with Schmorl's node L2-3: Disc degeneration with Schmorl's node. Mild disc bulging and endplate spurring without significant stenosis L2-3: Mild disc and moderate  facet degeneration. Negative for stenosis L4-5: Diffuse bulging of the disc with moderate facet degeneration. Negative for stenosis L5-S1: Disc degeneration and spurring left greater than right with left foraminal encroachment. Bilateral facet hypertrophy. IMPRESSION: MR THORACIC SPINE IMPRESSION Mild to moderate compression fracture T12 with bone marrow edema and paraspinous soft tissue edema compatible with acute or subacute fracture. Mild retropulsion of T12 into the canal without significant spinal stenosis. Severe diffuse thoracic disc degeneration without significant spinal stenosis. MR LUMBAR SPINE IMPRESSION Negative for lumbar fracture. Multilevel degenerative change as above without significant spinal stenosis. Electronically Signed   By: Franchot Gallo M.D.   On: 08/19/2018 10:53   Mr Lumbar Spine Wo Contrast  Result Date: 08/19/2018 CLINICAL DATA:  Pre vertebroplasty. T12 fracture. Back pain 2 months EXAM: MRI THORACIC AND LUMBAR SPINE WITHOUT CONTRAST TECHNIQUE: Multiplanar and multiecho pulse sequences of the thoracic and lumbar spine were obtained without intravenous contrast. COMPARISON:  CT lumbar spine 08/18/2018 FINDINGS: MRI THORACIC SPINE FINDINGS Alignment: Mild anterolisthesis C7-T1, T2-3, T3-4. Mild retrolisthesis T11-12 Vertebrae: Mild to moderate compression fracture T12 with diffuse bone marrow edema. Paraspinous edema bilaterally compatible with recent fracture. Mild retropulsion of the superior endplate of U13 into the canal without significant stenosis. Cord: Negative for cord compression. Normal cord signal without cord lesion. Paraspinal and other soft tissues: Paraspinous soft tissue swelling around the T12 fracture bilaterally. No mass lesion. Disc levels: Advanced disc degeneration throughout the entire lumbar spine  with disc space narrowing and spurring diffusely. Arthrodesis at T7-8. No cord compression or significant spinal stenosis. Mild foraminal narrowing bilaterally at  multiple levels due to spurring. MRI LUMBAR SPINE FINDINGS Segmentation:  Normal Alignment:  Mild retrolisthesis L1-2 and L2-3 Vertebrae: Mild to moderate fracture of T12 which appears acute or subacute. No lumbar fracture identified. Conus medullaris and cauda equina: Conus extends to the L1 level. Conus and cauda equina appear normal. Paraspinal and other soft tissues: Negative for paraspinous mass or edema Disc levels: L1-2: Mild disc degeneration with Schmorl's node L2-3: Disc degeneration with Schmorl's node. Mild disc bulging and endplate spurring without significant stenosis L2-3: Mild disc and moderate facet degeneration. Negative for stenosis L4-5: Diffuse bulging of the disc with moderate facet degeneration. Negative for stenosis L5-S1: Disc degeneration and spurring left greater than right with left foraminal encroachment. Bilateral facet hypertrophy. IMPRESSION: MR THORACIC SPINE IMPRESSION Mild to moderate compression fracture T12 with bone marrow edema and paraspinous soft tissue edema compatible with acute or subacute fracture. Mild retropulsion of T12 into the canal without significant spinal stenosis. Severe diffuse thoracic disc degeneration without significant spinal stenosis. MR LUMBAR SPINE IMPRESSION Negative for lumbar fracture. Multilevel degenerative change as above without significant spinal stenosis. Electronically Signed   By: Franchot Gallo M.D.   On: 08/19/2018 10:53   Ct Abdomen Pelvis W Contrast  Result Date: 08/18/2018 CLINICAL DATA:  Right greater than left lower abdominal pain. EXAM: CT ABDOMEN AND PELVIS WITH CONTRAST TECHNIQUE: Multidetector CT imaging of the abdomen and pelvis was performed using the standard protocol following bolus administration of intravenous contrast. CONTRAST:  61mL OMNIPAQUE IOHEXOL 300 MG/ML  SOLN COMPARISON:  MRI abdomen dated March 26, 2018. CT abdomen pelvis dated January 15, 2015. FINDINGS: Lower chest: No acute abnormality. Hepatobiliary:  Multiple hepatic simple cysts and small hemangiomas are unchanged. Unchanged small gallbladder polyp versus gallstone. No gallbladder wall thickening. Unchanged mild common bile duct and central intrahepatic biliary dilatation. Pancreas: Unremarkable. No pancreatic ductal dilatation or surrounding inflammatory changes. Spleen: Normal in size without focal abnormality. Adrenals/Urinary Tract: The adrenal glands are unremarkable. There is a 1.2 cm exophytic, high-attenuating lesion arising from the midpole of the right kidney, previously characterized as a hemorrhagic/proteinaceous cyst on prior MRI. Subcentimeter low-density lesions in both kidneys remain too small to characterize, but are unchanged. Slight interval enlargement of the nonobstructive calculi in the lower pole of the left kidney and midpole of the right kidney. Decreased mild left hydronephrosis to the level of the UPJ. No right hydronephrosis. The bladder is unremarkable for the degree of distention. Stomach/Bowel: Stomach is within normal limits. Appendix is surgically absent. No evidence of bowel wall thickening, distention, or inflammatory changes. Moderate left-sided colonic diverticulosis. Large colonic stool burden. Vascular/Lymphatic: Aortic atherosclerosis. No enlarged abdominal or pelvic lymph nodes. Reproductive: Status post hysterectomy. No adnexal masses. Other: No abdominal wall hernia or abnormality. No abdominopelvic ascites. No pneumoperitoneum. Musculoskeletal: No acute or significant osseous findings. Slight interval progression of the now moderate T12 compression deformity since November 2019. IMPRESSION: 1.  No acute intra-abdominal process. 2. Prominent stool throughout the colon. Correlate for constipation. 3. Slight interval increase in size of bilateral nonobstructive nephrolithiasis. 4. Slight interval progression of the now moderate T12 compression deformity since November 2019. Electronically Signed   By: Titus Dubin  M.D.   On: 08/18/2018 15:22   Ir Kypho Lumbar Inc Fx Reduce Bone Bx Uni/bil Cannulation Inc/imaging  Result Date: 08/23/2018 CLINICAL DATA:  Symptomatic compression fracture, failed conservative management.  Please perform fluoroscopic guided kyphoplasty for symptomatic purposes. EXAM: FLUOROSCOPIC GUIDED KYPHOPLASTY OF THE T12 VERTEBRAL BODY COMPARISON:  Thoracic and lumbar spine MRI - 08/19/2018 MEDICATIONS: As antibiotic prophylaxis, Ancef 2 gm IV was ordered pre-procedure and administered intravenously within 1 hour of incision. ANESTHESIA/SEDATION: Moderate (conscious) sedation was employed during this procedure. A total of Versed 2 mg and Fentanyl 75 mcg was administered intravenously. Moderate Sedation Time: 27 minutes. The patient's level of consciousness and vital signs were monitored continuously by radiology nursing throughout the procedure under my direct supervision. FLUOROSCOPY TIME:  5 minutes, 48 seconds (902 mGy) COMPLICATIONS: None immediate. TECHNIQUE: The procedure, risks (including but not limited to bleeding, infection, organ damage), benefits, and alternatives were explained to the patient. Questions regarding the procedure were encouraged and answered. The patient understands and consents to the procedure. The patient was placed prone on the fluoroscopic table. The skin overlying the thoracolumbar region was then prepped and draped in the usual sterile fashion. Maximal barrier sterile technique was utilized including caps, mask, sterile gowns, sterile gloves, sterile drape, hand hygiene and skin antiseptic. Intravenous Fentanyl and Versed were administered as conscious sedation during continuous cardiorespiratory monitoring by the radiology RN. The left pedicle at T12 was then infiltrated with 1% lidocaine followed by the advancement of a Kyphon trocar needle through the left pedicle into the posterior one-third of the vertebral body. Subsequently, the osteo drill was advanced to the  anterior third of the vertebral body. The osteo drill was retracted. Through the working cannula, a Kyphon inflatable bone tamp 15 x 2 was advanced and positioned with the distal marker approximately 5 mm from the anterior aspect of the cortex. Appropriate positioning was confirmed on the AP projection. At this time, the balloon was expanded using contrast via a Kyphon inflation syringe device via micro tubing. Inflations were continued until there was near apposition with the superior end plate. At this time, methylmethacrylate mixture was reconstituted in the Kyphon bone mixing device system. This was then loaded into the delivery mechanism, attached to Kyphon bone fillers. The balloons were deflated and removed followed by the instillation of methylmethacrylate mixture with excellent filling in the AP and lateral projections. No extravasation was noted in the disk spaces or posteriorly into the spinal canal. No epidural venous contamination was seen. The working cannulae and the bone filler were then retrieved and removed. Hemostasis was achieved with manual compression. The patient tolerated the procedure well without immediate postprocedural complication. IMPRESSION: 1. Technically successful T12 vertebral body augmentation using balloon kyphoplasty. 2. Per CMS PQRS reporting requirements (PQRS Measure 24): Given the patient's age of greater than 41 and the fracture site (hip, distal radius, or spine), the patient should be tested for osteoporosis using DXA, and the appropriate treatment considered based on the DXA results. Electronically Signed   By: Sandi Mariscal M.D.   On: 08/23/2018 15:56     Microbiology: No results found for this or any previous visit (from the past 240 hour(s)).   Labs: Basic Metabolic Panel: Recent Labs  Lab 08/18/18 1205 08/19/18 0457  NA 141 143  K 4.3 4.2  CL 101 102  CO2 30 30  GLUCOSE 107* 85  BUN 24* 20  CREATININE 1.12* 1.16*  CALCIUM 9.4 9.1  MG  --  2.7*    Liver Function Tests: Recent Labs  Lab 08/18/18 1205  AST 33  ALT 26  ALKPHOS 89  BILITOT 0.6  PROT 7.3  ALBUMIN 4.4   No results for input(s): LIPASE,  AMYLASE in the last 168 hours. No results for input(s): AMMONIA in the last 168 hours. CBC: Recent Labs  Lab 08/18/18 1205  08/20/18 0502 08/21/18 0607 08/22/18 0447 08/23/18 0423 08/24/18 0440  WBC 7.0   < > 5.6 5.6 6.7 7.2 6.7  NEUTROABS 5.2  --   --   --   --   --   --   HGB 14.6   < > 13.2 12.9 12.6 12.7 13.5  HCT 45.5   < > 39.9 40.2 39.6 40.5 41.2  MCV 90.1   < > 88.1 90.5 91.5 91.8 88.8  PLT 300   < > 278 266 284 290 284   < > = values in this interval not displayed.   Cardiac Enzymes: No results for input(s): CKTOTAL, CKMB, CKMBINDEX, TROPONINI in the last 168 hours. BNP: Invalid input(s): POCBNP CBG: No results for input(s): GLUCAP in the last 168 hours.  Time coordinating discharge:  36 minutes  Signed:  Orson Eva, DO [facility] Pager: [phone] 08/24/2018, 1:09 PM

## 2018-08-24 NOTE — Progress Notes (Signed)
ANTICOAGULATION CONSULT NOTE - Follow Up Consult  Pharmacy Consult for Heparin (warfarin on hold) Indication: multiple ischemic CVAs   Allergies  Allergen Reactions  . Epinephrine Anaphylaxis  . Demerol [Meperidine] Other (See Comments)    headaches  . Vicodin [Hydrocodone-Acetaminophen] Other (See Comments)    [name] it makes her crazy. Pt tolerates acetaminophen    Patient Measurements: Height: 5\' 2"  (157.5 cm) Weight: 136 lb 11 oz (62 kg) IBW/kg (Calculated) : 50.1  HEPARIN DW (KG): 59.7   Temp: 98.2 F (36.8 C) (03/31 0550) Temp Source: Oral (03/31 0550) BP: 152/67 (03/31 0550) Pulse Rate: 55 (03/31 0550)Labs: Recent Labs    08/21/18 1251  08/22/18 0447  08/22/18 1640 08/23/18 0423 08/24/18 0440  HGB  --    < > 12.6  --   --  12.7 13.5  HCT  --   --  39.6  --   --  40.5 41.2  PLT  --   --  284  --   --  290 284  APTT >200*  --   --   --   --   --   --   LABPROT  --   --  15.1  --   --   --   --   INR  --   --  1.2  --   --   --   --   HEPARINUNFRC 1.50*   < >  --    < > 0.57 0.54 0.26*   < > = values in this interval not displayed.    Estimated Creatinine Clearance: 33 mL/min (A) (by C-G formula based on SCr of 1.16 mg/dL (H)).  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 Patient was found to have a T12 compression fracture and warfarin is now on hold for possible intervention in the near future. Using heparin while warfarin is on hold. INR 1.2. Pt is scheduled for T12 KP 3/30 at [facility]  Patient is very sensitive to heparin.  Heparin level remains within desired therapeutic goal range.  F/U plan Goal of Therapy:  Heparin level 0.3--0.7 IU/mL Monitor platelets by anticoagulation protocol: Yes   Plan: Increase heparin to 550 units/hr   Daily CBC/HL Monitor for bleeding  Margot Ables, PharmD Clinical Pharmacist 08/24/2018 8:02 AM

## 2018-08-24 NOTE — Care Management Important Message (Signed)
Important Message  Patient Details  Name: [name] MRN: [id number] Date of Birth: Oct 27, 1935   Medicare Important Message Given:  Yes    Sherald Barge, RN 08/24/2018, 1:27 PM

## 2018-08-24 NOTE — TOC Transition Note (Signed)
Transition of Care [facility]) - CM/SW Discharge Note   Patient Details  Name: JISELE PRICE MRN: [id number] Date of Birth: 11/26/1935  Transition of Care [facility]) CM/SW Contact:  Sherald Barge, RN Phone Number: 08/24/2018, 3:01 PM   Clinical Narrative:   Faythe Ghee for DC to SNF today. Auth received from [facility]. Auth # [id number].     Final next level of care: Edie     Patient Goals and CMS Choice Patient states their goals for this hospitalization and ongoing recovery are:: go back to volunteering CMS Medicare.gov Compare Post Acute Care list provided to:: Patient Choice offered to / list presented to : Patient  Discharge Placement              Patient chooses bed at: [facility] Patient to be transferred to facility by: [facility] Staff Name of family member notified: patient to notify any friends/family at their request Patient and family notified of of transfer: 08/24/18  Discharge Plan and Services     Post Acute Care Choice: [place]                    Social Determinants of Health (SDOH) Interventions     Readmission Risk Interventions No flowsheet data found.

## 2018-08-25 ENCOUNTER — Other Ambulatory Visit: Payer: Self-pay | Admitting: *Deleted

## 2018-08-25 ENCOUNTER — Encounter: Payer: Self-pay | Admitting: Internal Medicine

## 2018-08-25 ENCOUNTER — Non-Acute Institutional Stay (SKILLED_NURSING_FACILITY): Payer: PPO | Admitting: Internal Medicine

## 2018-08-25 DIAGNOSIS — Z8673 Personal history of transient ischemic attack (TIA), and cerebral infarction without residual deficits: Secondary | ICD-10-CM | POA: Diagnosis not present

## 2018-08-25 DIAGNOSIS — E039 Hypothyroidism, unspecified: Secondary | ICD-10-CM

## 2018-08-25 DIAGNOSIS — I5032 Chronic diastolic (congestive) heart failure: Secondary | ICD-10-CM

## 2018-08-25 DIAGNOSIS — F339 Major depressive disorder, recurrent, unspecified: Secondary | ICD-10-CM | POA: Diagnosis not present

## 2018-08-25 DIAGNOSIS — S22080G Wedge compression fracture of T11-T12 vertebra, subsequent encounter for fracture with delayed healing: Secondary | ICD-10-CM | POA: Diagnosis not present

## 2018-08-25 DIAGNOSIS — I10 Essential (primary) hypertension: Secondary | ICD-10-CM

## 2018-08-25 NOTE — Progress Notes (Signed)
Provider:  Veleta Miners, MD Location:  [place] Room Number: 159 P Place of Service:  SNF ([phone])  PCP: Celene Squibb, MD Patient Care Team: Celene Squibb, MD as PCP - General (Internal Medicine)  Extended Emergency Contact Information Primary Emergency Contact: Shearer,Mike Address: [address]          [place], [place] [address] [place] Phone: [phone] Relation: Brother Secondary Emergency Contact: citty,betty Mobile Phone: [phone] Relation: Friend  Code Status: Full Code Goals of Care: Advanced Directive information Advanced Directives 08/25/2018  Does Patient Have a Medical Advance Directive? No  Type of Advance Directive -  Copy of [place] in Chart? -  Would patient like information on creating a medical advance directive? No - Patient declined  Pre-existing out of facility DNR order (yellow form or pink MOST form) -      Chief Complaint  Patient presents with   New Admit To SNF    Admission    HPI: Patient is a 83 y.o. female seen today for admission to SNF for therapy She was admitted in the hospital from 03/25-03/31 acute back pain due to T12 compression fracture status post kyphoplasty  Patient has h/o ischemic stroke and chronic Coumadin, hypertension, hypothyroidism and depression  Patient was admitted with acute back pain with inability to do her ADLs at home. She was found to have a T12 compression fracture.  She underwent kyphoplasty on 3/30.  But she continued to be dependent for her ADLs and was decided to send her to SNF for therapy. In the facility patient continues to have severe right sided back pain radiating down to her leg.  She continues to also have muscle spasms especially when she moves. Patient is a Psychologist, occupational in our facility.  She was very active and was driving before this.  She uses a cane sometimes at home lives by herself in an apartment and does not have anybody who can help  her    Past Medical History:  Diagnosis Date   Adenomatous colon polyp 2008   Asthma    Complication of anesthesia    CVA (cerebral vascular accident) ([place])    sl weaker on rt foot   Diverticulosis    GERD (gastroesophageal reflux disease)    Hemorrhoids 2007   HTN (hypertension)    Hyperplastic colon polyp 2005   Hypothyroidism    Narcolepsy    PONV (postoperative nausea and vomiting)    TIA (transient ischemic attack)    Past Surgical History:  Procedure Laterality Date   ABDOMINAL HYSTERECTOMY     age 44, complicated by poor wound healing, followed by revision of scar and radiation for ?malignancy   BREAST ENHANCEMENT SURGERY     BREAST IMPLANT EXCHANGE Right 06/23/2016   Procedure: RIGHT BREAST IMPLANT REMOVAL AND REPLACEMENT;  Surgeon: Cristine Polio, MD;  Location: [facility];  Service: Plastics;  Laterality: Right;   COLONOSCOPY  2005   2 hyperplastic polyps   COLONOSCOPY  2007   Dr. Oneida Alar- hemorrhoids   COLONOSCOPY  2008   Dr. Reed Pandy polyp- rare sigmoid diverticulosis, internal hemorrhoids   COLONOSCOPY  12/01/2011   Procedure: COLONOSCOPY;  Surgeon: Danie Binder, MD;  [facility];  Service: Endoscopy;  Laterality: N/A;  12:30 PM   ESOPHAGOGASTRODUODENOSCOPY (EGD) WITH ESOPHAGEAL DILATION N/A 04/15/2013   Procedure: ESOPHAGOGASTRODUODENOSCOPY (EGD) WITH ESOPHAGEAL DILATION;  Surgeon: Danie Binder, MD;  [facility];  Service:  Endoscopy;  Laterality: N/A;  11:45-moved to 12:30 Darius Bump to notify pt   EYE SURGERY  2010   EYE SURGERY     IR KYPHO LUMBAR INC FX REDUCE BONE BX UNI/BIL CANNULATION INC/IMAGING  08/23/2018   right thumb surgery      reports that she has never smoked. She has never used smokeless tobacco. She reports that she does not drink alcohol or use drugs. Social History   Socioeconomic History   Marital status: Widowed    Spouse name: Not on file   Number of  children: Not on file   Years of education: Not on file   Highest education level: Not on file  Occupational History   Not on file  Social Needs   Financial resource strain: Not on file   Food insecurity:    Worry: Not on file    Inability: Not on file   Transportation needs:    Medical: Not on file    Non-medical: Not on file  Tobacco Use   Smoking status: Never Smoker   Smokeless tobacco: Never Used  Substance and Sexual Activity   Alcohol use: No   Drug use: No   Sexual activity: Never  Lifestyle   Physical activity:    Days per week: Not on file    Minutes per session: Not on file   Stress: Not on file  Relationships   Social connections:    Talks on phone: Not on file    Gets together: Not on file    Attends religious service: Not on file    Active member of club or organization: Not on file    Attends meetings of clubs or organizations: Not on file    Relationship status: Not on file   Intimate partner violence:    Fear of current or ex partner: Not on file    Emotionally abused: Not on file    Physically abused: Not on file    Forced sexual activity: Not on file  Other Topics Concern   Not on file  Social History Narrative   Not on file    Functional Status Survey:    Family History  Problem Relation Age of Onset   Colon cancer Father        age 66   Prostate cancer Father    Pancreatic cancer Mother        age 78    Health Maintenance  Topic Date Due   PNA vac Low Risk Adult (2 of 2 - PCV13) 09/24/2018 (Originally 04/25/2009)   INFLUENZA VACCINE  12/25/2018   TETANUS/TDAP  12/16/2022   DEXA SCAN  Completed    Allergies  Allergen Reactions   Epinephrine Anaphylaxis   Demerol [Meperidine] Other (See Comments)    headaches   Vicodin [Hydrocodone-Acetaminophen] Other (See Comments)    Said it makes her crazy. Pt tolerates acetaminophen    Outpatient Encounter Medications as of 08/25/2018  Medication Sig    albuterol (PROVENTIL) (2.5 MG/3ML) 0.083% nebulizer solution Take 3 mLs (2.5 mg total) by nebulization every 6 (six) hours as needed for wheezing or shortness of breath.   amLODipine (NORVASC) 5 MG tablet TAKE 1 TABLET BY MOUTH EVERY DAY   Ascorbic Acid (VITAMIN C) 1000 MG tablet Take 1,000 mg by mouth daily.   B Complex Vitamins (VITAMIN B COMPLEX) TABS Take 1 tablet by mouth daily.   Biotin 300 MCG TABS Take 1 tablet by mouth daily after supper.    buPROPion (WELLBUTRIN XL) 150  MG 24 hr tablet Take 150 mg by mouth daily.   Calcium Carbonate 500 MG CHEW Chew 1 tablet by mouth daily.   cholecalciferol (VITAMIN D) 1000 UNITS tablet Take 1,000 Units by mouth daily.   cyanocobalamin 100 MCG tablet Take 1 tablet by mouth daily.    diazepam (VALIUM) 2 MG tablet Take 1 tablet (2 mg total) by mouth every 8 (eight) hours as needed for anxiety or muscle spasms.   enoxaparin (LOVENOX) 60 MG/0.6ML injection Inject 0.6 mLs (60 mg total) into the skin daily.   ezetimibe (ZETIA) 10 MG tablet Take 10 mg by mouth daily.   Ferrous Gluconate (IRON) 240 (27 FE) MG TABS Take 1 tablet by mouth daily.   Flaxseed, Linseed, (FLAXSEED OIL) 1000 MG CAPS Take 1 capsule by mouth.    furosemide (LASIX) 40 MG tablet Take 40 mg by mouth 2 (two) times daily.    Lecithin 1200 MG CAPS Take 1 capsule by mouth daily.   levothyroxine (SYNTHROID, LEVOTHROID) 75 MCG tablet Take 50 mcg by mouth daily.    Lutein 6 MG CAPS Take 1 capsule by mouth daily.   Magnesium 250 MG TABS Take 1 tablet by mouth daily.   methocarbamol (ROBAXIN) 500 MG tablet Take 1 tablet (500 mg total) by mouth 3 (three) times daily. X 5 days   metoprolol tartrate (LOPRESSOR) 25 MG tablet Take 25 mg by mouth 2 (two) times daily. Take with food / meals   NON FORMULARY Diet Type:  NAS   Omega-3 Fatty Acids (FISH OIL) 1200 MG CAPS Take 1,200 mg by mouth daily.   oxyCODONE (OXY IR/ROXICODONE) 5 MG immediate release tablet Take 1 tablet (5 mg  total) by mouth every 4 (four) hours as needed for severe pain.   pantoprazole (PROTONIX) 40 MG tablet Take 1 tablet by mouth daily as needed.   polyethylene glycol (MIRALAX / GLYCOLAX) packet Take 17 g by mouth daily.   potassium chloride (K-DUR) 10 MEQ tablet Take 10 mEq by mouth 2 (two) times daily.   senna (SENOKOT) 8.6 MG TABS tablet Take 2 tablets (17.2 mg total) by mouth daily.   traMADol (ULTRAM) 50 MG tablet Take 1 tablet (50 mg total) by mouth every 12 (twelve) hours.   Turmeric 500 MG TABS Take by mouth daily.   warfarin (COUMADIN) 4 MG tablet Take 4 mg by mouth daily.   Zinc 50 MG TABS Take 1 tablet by mouth daily.   [DISCONTINUED] CALCIUM CARBONATE PO Take 1 tablet by mouth daily. Patient states medication is calcium 1200 mg without vitamin D.   [DISCONTINUED] furosemide (LASIX) 40 MG tablet TAKE 1 TABLET BY MOUTH TWICE DAILY (Patient not taking: No sig reported)   [DISCONTINUED] LUTEIN PO Take 1 capsule by mouth daily. Strength varies based on what is purchased at the time.   [DISCONTINUED] metoprolol tartrate (LOPRESSOR) 25 MG tablet TAKE 1 TABLET BY MOUTH TWICE DAILY (Patient not taking: No sig reported)   [DISCONTINUED] Nebulizers (COMPRESSOR/NEBULIZER) MISC Use as directed (Patient not taking: Reported on 08/25/2018)   No facility-administered encounter medications on file as of 08/25/2018.     Review of Systems  Constitutional: Positive for activity change. Negative for appetite change.  HENT: Negative.   Respiratory: Negative.   Cardiovascular: Negative.   Genitourinary: Negative.   Musculoskeletal: Positive for arthralgias and back pain.  Skin: Negative.   Neurological: Positive for weakness.  Psychiatric/Behavioral: Negative.     Vitals:   08/25/18 1000  BP: (!) 141/76  Pulse: (!) 58  Resp: 20  Temp: 98 F (36.7 C)  Weight: 133 lb 12.8 oz (60.7 kg)  Height: 5\' 2"  (1.575 m)   Body mass index is 24.47 kg/m. Physical Exam Vitals signs reviewed.    HENT:     Head: Normocephalic.     Nose: Nose normal.     Mouth/Throat:     Mouth: Mucous membranes are moist.     Pharynx: Oropharynx is clear.  Eyes:     Pupils: Pupils are equal, round, and reactive to light.  Cardiovascular:     Rate and Rhythm: Normal rate and regular rhythm.     Pulses: Normal pulses.     Heart sounds: Normal heart sounds.  Pulmonary:     Effort: Pulmonary effort is normal. No respiratory distress.     Breath sounds: Normal breath sounds. No wheezing or rales.  Abdominal:     General: Abdomen is flat. Bowel sounds are normal.     Palpations: Abdomen is soft.  Musculoskeletal:     Comments: Mild edema Bilateral  Skin:    General: Skin is warm and dry.  Neurological:     General: No focal deficit present.     Mental Status: She is alert and oriented to person, place, and time.     Comments: Does have some mild weakness in Right LE but mostly due to pain  Psychiatric:        Mood and Affect: Mood normal.        Thought Content: Thought content normal.        Judgment: Judgment normal.     Labs reviewed: Basic Metabolic Panel: Recent Labs    03/26/18 0938 08/18/18 1205 08/19/18 0457  NA  --  141 143  K  --  4.3 4.2  CL  --  101 102  CO2  --  30 30  GLUCOSE  --  107* 85  BUN  --  24* 20  CREATININE 1.10* 1.12* 1.16*  CALCIUM  --  9.4 9.1  MG  --   --  2.7*   Liver Function Tests: Recent Labs    08/18/18 1205  AST 33  ALT 26  ALKPHOS 89  BILITOT 0.6  PROT 7.3  ALBUMIN 4.4   No results for input(s): LIPASE, AMYLASE in the last 8760 hours. No results for input(s): AMMONIA in the last 8760 hours. CBC: Recent Labs    08/18/18 1205  08/22/18 0447 08/23/18 0423 08/24/18 0440  WBC 7.0   < > 6.7 7.2 6.7  NEUTROABS 5.2  --   --   --   --   HGB 14.6   < > 12.6 12.7 13.5  HCT 45.5   < > 39.6 40.5 41.2  MCV 90.1   < > 91.5 91.8 88.8  PLT 300   < > 284 290 284   < > = values in this interval not displayed.   Cardiac Enzymes: No  results for input(s): CKTOTAL, CKMB, CKMBINDEX, TROPONINI in the last 8760 hours. BNP: Invalid input(s): POCBNP Lab Results  Component Value Date   HGBA1C  01/04/2009    5.6 (NOTE) The ADA recommends the following therapeutic goal for glycemic control related to Hgb A1c measurement: Goal of therapy: <6.5 Hgb A1c  Reference: American Diabetes Association: Clinical Practice Recommendations 2010, Diabetes Care, 2010, 33: (Suppl  1).   No results found for: TSH No results found for: VITAMINB12 No results found for: FOLATE No results found for: IRON, TIBC, FERRITIN  Imaging and Procedures  obtained prior to SNF admission: Ir Kypho Lumbar Inc Fx Reduce Bone Bx Uni/bil Cannulation Inc/imaging  Result Date: 08/23/2018 CLINICAL DATA:  Symptomatic compression fracture, failed conservative management. Please perform fluoroscopic guided kyphoplasty for symptomatic purposes. EXAM: FLUOROSCOPIC GUIDED KYPHOPLASTY OF THE T12 VERTEBRAL BODY COMPARISON:  Thoracic and lumbar spine MRI - 08/19/2018 MEDICATIONS: As antibiotic prophylaxis, Ancef 2 gm IV was ordered pre-procedure and administered intravenously within 1 hour of incision. ANESTHESIA/SEDATION: Moderate (conscious) sedation was employed during this procedure. A total of Versed 2 mg and Fentanyl 75 mcg was administered intravenously. Moderate Sedation Time: 27 minutes. The patient's level of consciousness and vital signs were monitored continuously by radiology nursing throughout the procedure under my direct supervision. FLUOROSCOPY TIME:  5 minutes, 48 seconds (390 mGy) COMPLICATIONS: None immediate. TECHNIQUE: The procedure, risks (including but not limited to bleeding, infection, organ damage), benefits, and alternatives were explained to the patient. Questions regarding the procedure were encouraged and answered. The patient understands and consents to the procedure. The patient was placed prone on the fluoroscopic table. The skin overlying the  thoracolumbar region was then prepped and draped in the usual sterile fashion. Maximal barrier sterile technique was utilized including caps, mask, sterile gowns, sterile gloves, sterile drape, hand hygiene and skin antiseptic. Intravenous Fentanyl and Versed were administered as conscious sedation during continuous cardiorespiratory monitoring by the radiology RN. The left pedicle at T12 was then infiltrated with 1% lidocaine followed by the advancement of a Kyphon trocar needle through the left pedicle into the posterior one-third of the vertebral body. Subsequently, the osteo drill was advanced to the anterior third of the vertebral body. The osteo drill was retracted. Through the working cannula, a Kyphon inflatable bone tamp 15 x 2 was advanced and positioned with the distal marker approximately 5 mm from the anterior aspect of the cortex. Appropriate positioning was confirmed on the AP projection. At this time, the balloon was expanded using contrast via a Kyphon inflation syringe device via micro tubing. Inflations were continued until there was near apposition with the superior end plate. At this time, methylmethacrylate mixture was reconstituted in the Kyphon bone mixing device system. This was then loaded into the delivery mechanism, attached to Kyphon bone fillers. The balloons were deflated and removed followed by the instillation of methylmethacrylate mixture with excellent filling in the AP and lateral projections. No extravasation was noted in the disk spaces or posteriorly into the spinal canal. No epidural venous contamination was seen. The working cannulae and the bone filler were then retrieved and removed. Hemostasis was achieved with manual compression. The patient tolerated the procedure well without immediate postprocedural complication. IMPRESSION: 1. Technically successful T12 vertebral body augmentation using balloon kyphoplasty. 2. Per CMS PQRS reporting requirements (PQRS Measure 24):  Given the patient's age of greater than 76 and the fracture site (hip, distal radius, or spine), the patient should be tested for osteoporosis using DXA, and the appropriate treatment considered based on the DXA results. Electronically Signed   By: Sandi Mariscal M.D.   On: 08/23/2018 15:56    Assessment/Plan Compression fracture of T12 vertebra , S/P Khyphoplasty Will start her on Neurontin to see if it helps her pain Continue on Robaxina and  ultram Will order TLSO brace for her  Chronic diastolic CHF Last Echo was 55% Continue on Lasix 40 mg in am and 20 mg in PM as requested by patient  Essential hypertension Controlled on Norvasc and Lasix Acquired hypothyroidism Will check TSH Continue same dose of Levothyroxine  for now  H/O: CVA (cerebrovascular accident) On Coumadin And Lovenox Will check PT/INR Adjust does of Coumadin as needed Depression, recurrent  Continue on Wellbutrin     Family/ staff Communication:   Labs/tests ordered: Total time spent in this patient care encounter was 45_ minutes; greater than 50% of the visit spent counseling patient, reviewing records , Labs and coordinating care for problems addressed at this encounter.

## 2018-08-25 NOTE — Patient Outreach (Signed)
[facility]) Care Management  08/25/2018  [name] [birth year] [id number]   Per PatientPing this patient admitted to [facility], noted that they are eligible for [name] management.  This patient will be followed by [name] in the community if any care management needs identified. [name] will provide full case management services.  [name] care management services are not appropriate at this time.   Plan to sign off. Will collaborate with [facility] as indicated.  [name]. [name], MSN, RN, [employer] , [place] [phone]) Business Cell  [phone]) Toll Free Office

## 2018-08-26 ENCOUNTER — Other Ambulatory Visit: Payer: Self-pay | Admitting: Internal Medicine

## 2018-08-26 ENCOUNTER — Encounter (HOSPITAL_COMMUNITY)
Admission: RE | Admit: 2018-08-26 | Discharge: 2018-08-26 | Disposition: A | Discharge status: Home / Self Care | Payer: PPO | Source: Skilled Nursing Facility | Attending: Internal Medicine | Admitting: Internal Medicine

## 2018-08-26 DIAGNOSIS — Z7901 Long term (current) use of anticoagulants: Secondary | ICD-10-CM | POA: Insufficient documentation

## 2018-08-26 DIAGNOSIS — Z8673 Personal history of transient ischemic attack (TIA), and cerebral infarction without residual deficits: Secondary | ICD-10-CM | POA: Insufficient documentation

## 2018-08-26 DIAGNOSIS — I10 Essential (primary) hypertension: Secondary | ICD-10-CM | POA: Insufficient documentation

## 2018-08-26 LAB — BASIC METABOLIC PANEL
Anion gap: 11 (ref 5–15)
BUN: 24 mg/dL — ABNORMAL HIGH (ref 8–23)
CO2: 27 mmol/L (ref 22–32)
Calcium: 9.4 mg/dL (ref 8.9–10.3)
Chloride: 104 mmol/L (ref 98–111)
Creatinine, Ser: 1.09 mg/dL — ABNORMAL HIGH (ref 0.44–1.00)
GFR calc Af Amer: 55 mL/min — ABNORMAL LOW (ref >60)
GFR calc non Af Amer: 48 mL/min — ABNORMAL LOW (ref >60)
Glucose, Bld: 95 mg/dL (ref 70–99)
Potassium: 3.8 mmol/L (ref 3.5–5.1)
Sodium: 142 mmol/L (ref 135–145)

## 2018-08-26 LAB — TSH: TSH: 5.979 u[IU]/mL — ABNORMAL HIGH (ref 0.350–4.500)

## 2018-08-26 LAB — PROTIME-INR
INR: 0.9 (ref 0.8–1.2)
Prothrombin Time: 12.5 seconds (ref 11.4–15.2)

## 2018-08-26 MED ORDER — HYDROCODONE-ACETAMINOPHEN 5-325 MG PO TABS: 1.0000 | ORAL_TABLET | Freq: Four times a day (QID) | ORAL | 0 refills | Status: DC | PRN

## 2018-08-26 MED ORDER — TRAMADOL HCL 50 MG PO TABS: 50.0000 mg | ORAL_TABLET | Freq: Two times a day (BID) | ORAL | 0 refills | Status: DC

## 2018-08-27 ENCOUNTER — Other Ambulatory Visit: Payer: Self-pay | Admitting: Internal Medicine

## 2018-08-27 MED ORDER — OXYCODONE HCL 5 MG PO TABS: 5.0000 mg | ORAL_TABLET | Freq: Four times a day (QID) | ORAL | 0 refills | Status: DC | PRN

## 2018-08-30 ENCOUNTER — Encounter (HOSPITAL_COMMUNITY)
Admission: RE | Admit: 2018-08-30 | Discharge: 2018-08-30 | Disposition: A | Discharge status: Home / Self Care | Payer: PPO | Source: Skilled Nursing Facility | Attending: *Deleted | Admitting: *Deleted

## 2018-08-30 LAB — PROTIME-INR
INR: 1.6 — ABNORMAL HIGH (ref 0.8–1.2)
Prothrombin Time: 18.5 seconds — ABNORMAL HIGH (ref 11.4–15.2)

## 2018-08-31 ENCOUNTER — Encounter: Payer: Self-pay | Admitting: Internal Medicine

## 2018-08-31 NOTE — Progress Notes (Deleted)
Location:  [facility] Room Number: [place] of Service:  SNF [phone]) Provider: Granville Lewis, PA  Celene Squibb, MD  Patient Care Team: Celene Squibb, MD as PCP - General (Internal Medicine)  Extended Emergency Contact Information Primary Emergency Contact: Shearer,Mike Address: [address]          [place], [place] [address] [place] Phone: [phone] Relation: Brother Secondary Emergency Contact: citty,betty Mobile Phone: [phone] Relation: Friend  Code Status:  Full Code Goals of care: Advanced Directive information Advanced Directives 08/31/2018  Does Patient Have a Medical Advance Directive? No  Type of Advance Directive -  Copy of [place] in Chart? -  Would patient like information on creating a medical advance directive? No - Patient declined  Pre-existing out of facility DNR order (yellow form or pink MOST form) -     Chief Complaint  Patient presents with   Medical Management of Chronic Issues    Short term Rehab    HPI:  Pt is a 83 y.o. female seen today for medical management of chronic diseases.     Past Medical History:  Diagnosis Date   Adenomatous colon polyp 2008   Asthma    Complication of anesthesia    CVA (cerebral vascular accident) ([place])    sl weaker on rt foot   Diverticulosis    GERD (gastroesophageal reflux disease)    Hemorrhoids 2007   HTN (hypertension)    Hyperplastic colon polyp 2005   Hypothyroidism    Narcolepsy    PONV (postoperative nausea and vomiting)    TIA (transient ischemic attack)    Past Surgical History:  Procedure Laterality Date   ABDOMINAL HYSTERECTOMY     age 15, complicated by poor wound healing, followed by revision of scar and radiation for ?malignancy   BREAST ENHANCEMENT SURGERY     BREAST IMPLANT EXCHANGE Right 06/23/2016   Procedure: RIGHT BREAST IMPLANT REMOVAL AND REPLACEMENT;  Surgeon: Cristine Polio, MD;  Location: [facility];  Service: Plastics;  Laterality: Right;   COLONOSCOPY  2005   2 hyperplastic polyps   COLONOSCOPY  2007   Dr. Oneida Alar- hemorrhoids   COLONOSCOPY  2008   Dr. Reed Pandy polyp- rare sigmoid diverticulosis, internal hemorrhoids   COLONOSCOPY  12/01/2011   Procedure: COLONOSCOPY;  Surgeon: Danie Binder, MD;  [facility];  Service: Endoscopy;  Laterality: N/A;  12:30 PM   ESOPHAGOGASTRODUODENOSCOPY (EGD) WITH ESOPHAGEAL DILATION N/A 04/15/2013   Procedure: ESOPHAGOGASTRODUODENOSCOPY (EGD) WITH ESOPHAGEAL DILATION;  Surgeon: Danie Binder, MD;  [facility];  Service: Endoscopy;  Laterality: N/A;  11:45-moved to 12:30 Darius Bump to notify pt   EYE SURGERY  2010   EYE SURGERY     IR KYPHO LUMBAR INC FX REDUCE BONE BX UNI/BIL CANNULATION INC/IMAGING  08/23/2018   right thumb surgery      Allergies  Allergen Reactions   Epinephrine Anaphylaxis   Demerol [Meperidine] Other (See Comments)    headaches   Vicodin [Hydrocodone-Acetaminophen] Other (See Comments)    [name] it makes her crazy. Pt tolerates acetaminophen    Outpatient Encounter Medications as of 08/31/2018  Medication Sig   albuterol (PROVENTIL) (2.5 MG/3ML) 0.083% nebulizer solution Take 3 mLs (2.5 mg total) by nebulization every 6 (six) hours as needed for wheezing or shortness of breath.   amLODipine (NORVASC) 5 MG tablet TAKE 1 TABLET BY MOUTH EVERY DAY   Ascorbic Acid (VITAMIN C)  1000 MG tablet Take 1,000 mg by mouth daily.   B Complex Vitamins (VITAMIN B COMPLEX) TABS Take 1 tablet by mouth daily.   Biotin 300 MCG TABS Take 1 tablet by mouth daily after supper.    buPROPion (WELLBUTRIN XL) 150 MG 24 hr tablet Take 150 mg by mouth daily.   Calcium Carbonate 500 MG CHEW Chew 1 tablet by mouth daily.   cholecalciferol (VITAMIN D) 1000 UNITS tablet Take 1,000 Units by mouth daily.   cyanocobalamin 100 MCG tablet Take 1 tablet by mouth daily.    enoxaparin  (LOVENOX) 60 MG/0.6ML injection Inject 0.6 mLs (60 mg total) into the skin daily.   ezetimibe (ZETIA) 10 MG tablet Take 10 mg by mouth daily.   Ferrous Gluconate (IRON) 240 (27 FE) MG TABS Take 1 tablet by mouth daily.   Flaxseed, Linseed, (FLAXSEED OIL) 1000 MG CAPS Take 1 capsule by mouth.    furosemide (LASIX) 20 MG tablet Take 20 mg by mouth 2 (two) times daily.    gabapentin (NEURONTIN) 100 MG capsule Take 100 mg by mouth 3 (three) times daily.   Lecithin 1200 MG CAPS Take 1 capsule by mouth daily.   levothyroxine (SYNTHROID, LEVOTHROID) 75 MCG tablet Take 50 mcg by mouth daily.    Lutein 6 MG CAPS Take 1 capsule by mouth daily.   Magnesium 250 MG TABS Take 1 tablet by mouth daily.   meloxicam (MOBIC) 15 MG tablet Take 15 mg by mouth daily.   metoprolol tartrate (LOPRESSOR) 25 MG tablet Take 25 mg by mouth 2 (two) times daily. Take with food / meals   NON FORMULARY Diet Type:  NAS   Omega-3 Fatty Acids (FISH OIL) 1200 MG CAPS Take 1,200 mg by mouth daily.   oxyCODONE (OXY IR/ROXICODONE) 5 MG immediate release tablet Take 1 tablet (5 mg total) by mouth every 6 (six) hours as needed for severe pain.   pantoprazole (PROTONIX) 40 MG tablet Take 1 tablet by mouth daily as needed.   polyethylene glycol (MIRALAX / GLYCOLAX) packet Take 17 g by mouth daily.   potassium chloride (K-DUR) 10 MEQ tablet Take 10 mEq by mouth 2 (two) times daily.   senna (SENOKOT) 8.6 MG TABS tablet Take 2 tablets (17.2 mg total) by mouth daily.   traMADol (ULTRAM) 50 MG tablet Take 1 tablet (50 mg total) by mouth every 12 (twelve) hours for 30 doses.   Turmeric 500 MG TABS Take by mouth daily.   warfarin (COUMADIN) 4 MG tablet Take 4 mg by mouth daily.   Zinc 50 MG TABS Take 1 tablet by mouth daily.   [DISCONTINUED] diazepam (VALIUM) 2 MG tablet Take 1 tablet (2 mg total) by mouth every 8 (eight) hours as needed for anxiety or muscle spasms. (Patient not taking: Reported on 08/31/2018)    [DISCONTINUED] HYDROcodone-acetaminophen (NORCO) 5-325 MG tablet Take 1 tablet by mouth every 6 (six) hours as needed for up to 14 days for moderate pain. (Patient not taking: Reported on 08/31/2018)   [DISCONTINUED] methocarbamol (ROBAXIN) 500 MG tablet Take 1 tablet (500 mg total) by mouth 3 (three) times daily. X 5 days (Patient not taking: Reported on 08/31/2018)   No facility-administered encounter medications on file as of 08/31/2018.     Review of Systems  Immunization History  Administered Date(s) Administered   Influenza Split 02/24/2012, 03/10/2013   Influenza-Unspecified 02/11/2018   Pneumococcal Polysaccharide-23 04/25/2008   Tdap 12/15/2012   Pertinent  Health Maintenance Due  Topic Date Due  PNA vac Low Risk Adult (2 of 2 - PCV13) 09/24/2018 (Originally 04/25/2009)   INFLUENZA VACCINE  12/25/2018   DEXA SCAN  Completed   No flowsheet data found. Functional Status Survey:    Vitals:   08/31/18 1027  BP: (!) 151/82  Pulse: (!) 55  Resp: 18  Temp: (!) 96.7 F (35.9 C)  Weight: 135 lb 9.6 oz (61.5 kg)  Height: 5\' 2"  (1.575 m)   Body mass index is 24.8 kg/m. Physical Exam  Labs reviewed: Recent Labs    08/18/18 1205 08/19/18 0457 08/26/18 0722  NA 141 143 142  K 4.3 4.2 3.8  CL 101 102 104  CO2 30 30 27   GLUCOSE 107* 85 95  BUN 24* 20 24*  CREATININE 1.12* 1.16* 1.09*  CALCIUM 9.4 9.1 9.4  MG  --  2.7*  --    Recent Labs    08/18/18 1205  AST 33  ALT 26  ALKPHOS 89  BILITOT 0.6  PROT 7.3  ALBUMIN 4.4   Recent Labs    08/18/18 1205  08/22/18 0447 08/23/18 0423 08/24/18 0440  WBC 7.0   < > 6.7 7.2 6.7  NEUTROABS 5.2  --   --   --   --   HGB 14.6   < > 12.6 12.7 13.5  HCT 45.5   < > 39.6 40.5 41.2  MCV 90.1   < > 91.5 91.8 88.8  PLT 300   < > 284 290 284   < > = values in this interval not displayed.   Lab Results  Component Value Date   TSH 5.979 (H) 08/26/2018   Lab Results  Component Value Date   HGBA1C  01/04/2009     5.6 (NOTE) The ADA recommends the following therapeutic goal for glycemic control related to Hgb A1c measurement: Goal of therapy: <6.5 Hgb A1c  Reference: American Diabetes Association: Clinical Practice Recommendations 2010, Diabetes Care, 2010, 33: (Suppl  1).   Lab Results  Component Value Date   CHOL  01/04/2009    136        ATP III CLASSIFICATION:  <200     mg/dL   Desirable  200-239  mg/dL   Borderline High  >=240    mg/dL   High          HDL 55 01/04/2009   LDLCALC  01/04/2009    65        Total Cholesterol/HDL:CHD Risk Coronary Heart Disease Risk Table                     Men   Women  1/2 Average Risk   3.4   3.3  Average Risk       5.0   4.4  2 X Average Risk   9.6   7.1  3 X Average Risk  23.4   11.0        Use the calculated Patient Ratio above and the CHD Risk Table to determine the patient's CHD Risk.        ATP III CLASSIFICATION (LDL):  <100     mg/dL   Optimal  100-129  mg/dL   Near or Above                    Optimal  130-159  mg/dL   Borderline  160-189  mg/dL   High  >190     mg/dL   Very High   TRIG 78 01/04/2009  CHOLHDL 2.5 01/04/2009    Significant Diagnostic Results in last 30 days:  Dg Thoracic Spine W/swimmers  Result Date: 08/18/2018 CLINICAL DATA:  Thoracic spine pain since lifting water a week ago. EXAM: THORACIC SPINE - 3 VIEWS COMPARISON:  CT chest 09/20/2012 and CT abdomen and pelvis 01/15/2015. FINDINGS: S shaped thoracolumbar scoliosis is identified. Remote T11 and T12 compression fractures are noted. No acute fracture. Mild multilevel degenerative change noted. IMPRESSION: No acute finding. S shaped scoliosis. Mild, remote T11 and T12 compression fractures. Electronically Signed   By: Inge Rise M.D.   On: 08/18/2018 10:34   Dg Lumbar Spine Complete  Result Date: 08/18/2018 CLINICAL DATA:  Low back pain for 1 week radiating into both legs after lifting water. EXAM: LUMBAR SPINE - COMPLETE 4+ VIEW COMPARISON:  CT abdomen and  pelvis 01/15/2015. FINDINGS: Convex left scoliosis is unchanged. The patient has remote T12 superior endplate and W29 inferior endplate compression fractures which are unchanged. Lower lumbar facet arthropathy is noted. Mild loss of disc space height is seen at L5-S1. Paraspinous structures demonstrate a stone in the lower pole of the left kidney, unchanged. Massive volume of stool throughout the colon is seen. IMPRESSION: No acute abnormality or change compared to the prior exam. Mild, remote T11 and T12 compression fractures. Lower lumbar spondylosis. Nonobstructing stone left kidney, unchanged. Very large volume of stool throughout the colon. Electronically Signed   By: Inge Rise M.D.   On: 08/18/2018 10:30   Mr Thoracic Spine Wo Contrast  Result Date: 08/19/2018 CLINICAL DATA:  Pre vertebroplasty. T12 fracture. Back pain 2 months EXAM: MRI THORACIC AND LUMBAR SPINE WITHOUT CONTRAST TECHNIQUE: Multiplanar and multiecho pulse sequences of the thoracic and lumbar spine were obtained without intravenous contrast. COMPARISON:  CT lumbar spine 08/18/2018 FINDINGS: MRI THORACIC SPINE FINDINGS Alignment: Mild anterolisthesis C7-T1, T2-3, T3-4. Mild retrolisthesis T11-12 Vertebrae: Mild to moderate compression fracture T12 with diffuse bone marrow edema. Paraspinous edema bilaterally compatible with recent fracture. Mild retropulsion of the superior endplate of H37 into the canal without significant stenosis. Cord: Negative for cord compression. Normal cord signal without cord lesion. Paraspinal and other soft tissues: Paraspinous soft tissue swelling around the T12 fracture bilaterally. No mass lesion. Disc levels: Advanced disc degeneration throughout the entire lumbar spine with disc space narrowing and spurring diffusely. Arthrodesis at T7-8. No cord compression or significant spinal stenosis. Mild foraminal narrowing bilaterally at multiple levels due to spurring. MRI LUMBAR SPINE FINDINGS Segmentation:   Normal Alignment:  Mild retrolisthesis L1-2 and L2-3 Vertebrae: Mild to moderate fracture of T12 which appears acute or subacute. No lumbar fracture identified. Conus medullaris and cauda equina: Conus extends to the L1 level. Conus and cauda equina appear normal. Paraspinal and other soft tissues: Negative for paraspinous mass or edema Disc levels: L1-2: Mild disc degeneration with Schmorl's node L2-3: Disc degeneration with Schmorl's node. Mild disc bulging and endplate spurring without significant stenosis L2-3: Mild disc and moderate facet degeneration. Negative for stenosis L4-5: Diffuse bulging of the disc with moderate facet degeneration. Negative for stenosis L5-S1: Disc degeneration and spurring left greater than right with left foraminal encroachment. Bilateral facet hypertrophy. IMPRESSION: MR THORACIC SPINE IMPRESSION Mild to moderate compression fracture T12 with bone marrow edema and paraspinous soft tissue edema compatible with acute or subacute fracture. Mild retropulsion of T12 into the canal without significant spinal stenosis. Severe diffuse thoracic disc degeneration without significant spinal stenosis. MR LUMBAR SPINE IMPRESSION Negative for lumbar fracture. Multilevel degenerative change as above without significant  spinal stenosis. Electronically Signed   By: Franchot Gallo M.D.   On: 08/19/2018 10:53   Mr Lumbar Spine Wo Contrast  Result Date: 08/19/2018 CLINICAL DATA:  Pre vertebroplasty. T12 fracture. Back pain 2 months EXAM: MRI THORACIC AND LUMBAR SPINE WITHOUT CONTRAST TECHNIQUE: Multiplanar and multiecho pulse sequences of the thoracic and lumbar spine were obtained without intravenous contrast. COMPARISON:  CT lumbar spine 08/18/2018 FINDINGS: MRI THORACIC SPINE FINDINGS Alignment: Mild anterolisthesis C7-T1, T2-3, T3-4. Mild retrolisthesis T11-12 Vertebrae: Mild to moderate compression fracture T12 with diffuse bone marrow edema. Paraspinous edema bilaterally compatible with  recent fracture. Mild retropulsion of the superior endplate of B71 into the canal without significant stenosis. Cord: Negative for cord compression. Normal cord signal without cord lesion. Paraspinal and other soft tissues: Paraspinous soft tissue swelling around the T12 fracture bilaterally. No mass lesion. Disc levels: Advanced disc degeneration throughout the entire lumbar spine with disc space narrowing and spurring diffusely. Arthrodesis at T7-8. No cord compression or significant spinal stenosis. Mild foraminal narrowing bilaterally at multiple levels due to spurring. MRI LUMBAR SPINE FINDINGS Segmentation:  Normal Alignment:  Mild retrolisthesis L1-2 and L2-3 Vertebrae: Mild to moderate fracture of T12 which appears acute or subacute. No lumbar fracture identified. Conus medullaris and cauda equina: Conus extends to the L1 level. Conus and cauda equina appear normal. Paraspinal and other soft tissues: Negative for paraspinous mass or edema Disc levels: L1-2: Mild disc degeneration with Schmorl's node L2-3: Disc degeneration with Schmorl's node. Mild disc bulging and endplate spurring without significant stenosis L2-3: Mild disc and moderate facet degeneration. Negative for stenosis L4-5: Diffuse bulging of the disc with moderate facet degeneration. Negative for stenosis L5-S1: Disc degeneration and spurring left greater than right with left foraminal encroachment. Bilateral facet hypertrophy. IMPRESSION: MR THORACIC SPINE IMPRESSION Mild to moderate compression fracture T12 with bone marrow edema and paraspinous soft tissue edema compatible with acute or subacute fracture. Mild retropulsion of T12 into the canal without significant spinal stenosis. Severe diffuse thoracic disc degeneration without significant spinal stenosis. MR LUMBAR SPINE IMPRESSION Negative for lumbar fracture. Multilevel degenerative change as above without significant spinal stenosis. Electronically Signed   By: Franchot Gallo M.D.    On: 08/19/2018 10:53   Ct Abdomen Pelvis W Contrast  Result Date: 08/18/2018 CLINICAL DATA:  Right greater than left lower abdominal pain. EXAM: CT ABDOMEN AND PELVIS WITH CONTRAST TECHNIQUE: Multidetector CT imaging of the abdomen and pelvis was performed using the standard protocol following bolus administration of intravenous contrast. CONTRAST:  75mL OMNIPAQUE IOHEXOL 300 MG/ML  SOLN COMPARISON:  MRI abdomen dated March 26, 2018. CT abdomen pelvis dated January 15, 2015. FINDINGS: Lower chest: No acute abnormality. Hepatobiliary: Multiple hepatic simple cysts and small hemangiomas are unchanged. Unchanged small gallbladder polyp versus gallstone. No gallbladder wall thickening. Unchanged mild common bile duct and central intrahepatic biliary dilatation. Pancreas: Unremarkable. No pancreatic ductal dilatation or surrounding inflammatory changes. Spleen: Normal in size without focal abnormality. Adrenals/Urinary Tract: The adrenal glands are unremarkable. There is a 1.2 cm exophytic, high-attenuating lesion arising from the midpole of the right kidney, previously characterized as a hemorrhagic/proteinaceous cyst on prior MRI. Subcentimeter low-density lesions in both kidneys remain too small to characterize, but are unchanged. Slight interval enlargement of the nonobstructive calculi in the lower pole of the left kidney and midpole of the right kidney. Decreased mild left hydronephrosis to the level of the UPJ. No right hydronephrosis. The bladder is unremarkable for the degree of distention. Stomach/Bowel: Stomach is within  normal limits. Appendix is surgically absent. No evidence of bowel wall thickening, distention, or inflammatory changes. Moderate left-sided colonic diverticulosis. Large colonic stool burden. Vascular/Lymphatic: Aortic atherosclerosis. No enlarged abdominal or pelvic lymph nodes. Reproductive: Status post hysterectomy. No adnexal masses. Other: No abdominal wall hernia or abnormality. No  abdominopelvic ascites. No pneumoperitoneum. Musculoskeletal: No acute or significant osseous findings. Slight interval progression of the now moderate T12 compression deformity since November 2019. IMPRESSION: 1.  No acute intra-abdominal process. 2. Prominent stool throughout the colon. Correlate for constipation. 3. Slight interval increase in size of bilateral nonobstructive nephrolithiasis. 4. Slight interval progression of the now moderate T12 compression deformity since November 2019. Electronically Signed   By: Titus Dubin M.D.   On: 08/18/2018 15:22   Ir Kypho Lumbar Inc Fx Reduce Bone Bx Uni/bil Cannulation Inc/imaging  Result Date: 08/23/2018 CLINICAL DATA:  Symptomatic compression fracture, failed conservative management. Please perform fluoroscopic guided kyphoplasty for symptomatic purposes. EXAM: FLUOROSCOPIC GUIDED KYPHOPLASTY OF THE T12 VERTEBRAL BODY COMPARISON:  Thoracic and lumbar spine MRI - 08/19/2018 MEDICATIONS: As antibiotic prophylaxis, Ancef 2 gm IV was ordered pre-procedure and administered intravenously within 1 hour of incision. ANESTHESIA/SEDATION: Moderate (conscious) sedation was employed during this procedure. A total of Versed 2 mg and Fentanyl 75 mcg was administered intravenously. Moderate Sedation Time: 27 minutes. The patient's level of consciousness and vital signs were monitored continuously by radiology nursing throughout the procedure under my direct supervision. FLUOROSCOPY TIME:  5 minutes, 48 seconds (416 mGy) COMPLICATIONS: None immediate. TECHNIQUE: The procedure, risks (including but not limited to bleeding, infection, organ damage), benefits, and alternatives were explained to the patient. Questions regarding the procedure were encouraged and answered. The patient understands and consents to the procedure. The patient was placed prone on the fluoroscopic table. The skin overlying the thoracolumbar region was then prepped and draped in the usual sterile  fashion. Maximal barrier sterile technique was utilized including caps, mask, sterile gowns, sterile gloves, sterile drape, hand hygiene and skin antiseptic. Intravenous Fentanyl and Versed were administered as conscious sedation during continuous cardiorespiratory monitoring by the radiology RN. The left pedicle at T12 was then infiltrated with 1% lidocaine followed by the advancement of a Kyphon trocar needle through the left pedicle into the posterior one-third of the vertebral body. Subsequently, the osteo drill was advanced to the anterior third of the vertebral body. The osteo drill was retracted. Through the working cannula, a Kyphon inflatable bone tamp 15 x 2 was advanced and positioned with the distal marker approximately 5 mm from the anterior aspect of the cortex. Appropriate positioning was confirmed on the AP projection. At this time, the balloon was expanded using contrast via a Kyphon inflation syringe device via micro tubing. Inflations were continued until there was near apposition with the superior end plate. At this time, methylmethacrylate mixture was reconstituted in the Kyphon bone mixing device system. This was then loaded into the delivery mechanism, attached to Kyphon bone fillers. The balloons were deflated and removed followed by the instillation of methylmethacrylate mixture with excellent filling in the AP and lateral projections. No extravasation was noted in the disk spaces or posteriorly into the spinal canal. No epidural venous contamination was seen. The working cannulae and the bone filler were then retrieved and removed. Hemostasis was achieved with manual compression. The patient tolerated the procedure well without immediate postprocedural complication. IMPRESSION: 1. Technically successful T12 vertebral body augmentation using balloon kyphoplasty. 2. Per CMS PQRS reporting requirements (PQRS Measure 24): Given the patient's age  of greater than 50 and the fracture site (hip,  distal radius, or spine), the patient should be tested for osteoporosis using DXA, and the appropriate treatment considered based on the DXA results. Electronically Signed   By: Sandi Mariscal M.D.   On: 08/23/2018 15:56    Assessment/Plan There are no diagnoses linked to this encounter.   Family/ staff Communication: ***  Labs/tests ordered:  ***

## 2018-09-01 ENCOUNTER — Non-Acute Institutional Stay (SKILLED_NURSING_FACILITY): Payer: PPO | Admitting: Internal Medicine

## 2018-09-01 ENCOUNTER — Encounter: Payer: Self-pay | Admitting: Internal Medicine

## 2018-09-01 DIAGNOSIS — Z7901 Long term (current) use of anticoagulants: Secondary | ICD-10-CM

## 2018-09-01 DIAGNOSIS — S22080G Wedge compression fracture of T11-T12 vertebra, subsequent encounter for fracture with delayed healing: Secondary | ICD-10-CM

## 2018-09-01 DIAGNOSIS — I5032 Chronic diastolic (congestive) heart failure: Secondary | ICD-10-CM

## 2018-09-01 DIAGNOSIS — I10 Essential (primary) hypertension: Secondary | ICD-10-CM | POA: Diagnosis not present

## 2018-09-01 DIAGNOSIS — Z8673 Personal history of transient ischemic attack (TIA), and cerebral infarction without residual deficits: Secondary | ICD-10-CM

## 2018-09-01 NOTE — Progress Notes (Signed)
Location:  [facility] Room Number: [place] of Service:  SNF [id number]) Provider:  Granville Lewis, PA-C  Celene Squibb, MD  Patient Care Team: Celene Squibb, MD as PCP - General (Internal Medicine)  Extended Emergency Contact Information Primary Emergency Contact: Shearer,Mike Address: [address]          [place], [place] [address] [place] Phone: [phone] Relation: Brother Secondary Emergency Contact: citty,betty Mobile Phone: [phone] Relation: Friend  Code Status:  Full Code Goals of care: Advanced Directive information Advanced Directives 09/01/2018  Does Patient Have a Medical Advance Directive? No  Type of Advance Directive -  Copy of [place] in Chart? -  Would patient like information on creating a medical advance directive? No - Patient declined  Pre-existing out of facility DNR order (yellow form or pink MOST form) -   Chief complaint medical management of chronic medical conditions-in short-term rehab patient  These include T12 compression fracture status post kyphoplasty- history of CVA as well as hypertension hypothyroidism and diastolic CHF  HPI:  Pt is a 83 y.o. female seen today for medical management of chronic diseases.  As above.  Patient is here for short-term rehab after hospitalization for acute back pain found secondary to a T12 compression fracture he subsequently received kyphoplasty  She also has a history of ischemic CVA Coumadin as well as hypertension hypothyroidism in addition to depression.  She underwent the kyphoplasty on March 30 but continued to be dependent for ADLs and decided going to a skilled nursing was her best option.  We are right-sided back pain in facility radiating down to her leg She was started on Neurontin she also has tramadol twice daily as well as oxycodone  5 mg every 6 hours as needed for breakthrough pain.  She had been on Robaxin but this was for a limited  duration.  She is also completing course of Mobic.  She continues to complain of pain she describes this more as spasm pain when she moves her right lower back In regards to her other medical issues he does have a history of CVA Coumadin INR is rising at 1.6--  In regards to hypertension this appears stable on Norvasc 5 mg a day as well as her Lasix 20 mg a day and Lopressor 25 mg twice daily blood pressure today 112/58 previous blood pressures 146/82-124/70- 5.  In regards to diastolic CHF she has a listed ejection fraction she does have some mild edema more so on her right leg- -her weight is  fairly stable at 135.9 pounds  Currently she is sitting in her wheelchair appears to be comfortable but she says when she moves   she has significant pain she is able to get up and ambulate with with a walker but is somewhat of a slow process    Past Medical History:  Diagnosis Date   Adenomatous colon polyp 2008   Asthma    Complication of anesthesia    CVA (cerebral vascular accident) ([place] [facility])    sl weaker on rt foot   Diverticulosis    GERD (gastroesophageal reflux disease)    Hemorrhoids 2007   HTN (hypertension)    Hyperplastic colon polyp 2005   Hypothyroidism    Narcolepsy    PONV (postoperative nausea and vomiting)    TIA (transient ischemic attack)    Past Surgical History:  Procedure Laterality Date   ABDOMINAL HYSTERECTOMY     age 81, complicated  by poor wound healing, followed by revision of scar and radiation for ?malignancy   BREAST ENHANCEMENT SURGERY     BREAST IMPLANT EXCHANGE Right 06/23/2016   Procedure: RIGHT BREAST IMPLANT REMOVAL AND REPLACEMENT;  Surgeon: Cristine Polio, MD;  Location: [facility];  Service: Plastics;  Laterality: Right;   COLONOSCOPY  2005   2 hyperplastic polyps   COLONOSCOPY  2007   Dr. Oneida Alar- hemorrhoids   COLONOSCOPY  2008   Dr. Reed Pandy polyp- rare sigmoid diverticulosis, internal hemorrhoids    COLONOSCOPY  12/01/2011   Procedure: COLONOSCOPY;  Surgeon: Danie Binder, MD;  [facility];  Service: Endoscopy;  Laterality: N/A;  12:30 PM   ESOPHAGOGASTRODUODENOSCOPY (EGD) WITH ESOPHAGEAL DILATION N/A 04/15/2013   Procedure: ESOPHAGOGASTRODUODENOSCOPY (EGD) WITH ESOPHAGEAL DILATION;  Surgeon: Danie Binder, MD;  [facility];  Service: Endoscopy;  Laterality: N/A;  11:45-moved to 12:30 Darius Bump to notify pt   EYE SURGERY  2010   EYE SURGERY     IR KYPHO LUMBAR INC FX REDUCE BONE BX UNI/BIL CANNULATION INC/IMAGING  08/23/2018   right thumb surgery      Allergies  Allergen Reactions   Epinephrine Anaphylaxis   Demerol [Meperidine] Other (See Comments)    headaches   Vicodin [Hydrocodone-Acetaminophen] Other (See Comments)    [name] it makes her crazy. Pt tolerates acetaminophen    Outpatient Encounter Medications as of 09/01/2018  Medication Sig   albuterol (PROVENTIL) (2.5 MG/3ML) 0.083% nebulizer solution Take 3 mLs (2.5 mg total) by nebulization every 6 (six) hours as needed for wheezing or shortness of breath.   amLODipine (NORVASC) 5 MG tablet TAKE 1 TABLET BY MOUTH EVERY DAY   Ascorbic Acid (VITAMIN C) 1000 MG tablet Take 1,000 mg by mouth daily.   B Complex Vitamins (VITAMIN B COMPLEX) TABS Take 1 tablet by mouth daily.   Biotin 300 MCG TABS Take 1 tablet by mouth daily after supper.    buPROPion (WELLBUTRIN XL) 150 MG 24 hr tablet Take 150 mg by mouth daily.   Calcium Carbonate 500 MG CHEW Chew 1 tablet by mouth daily.   cholecalciferol (VITAMIN D) 1000 UNITS tablet Take 1,000 Units by mouth daily.   cyanocobalamin 100 MCG tablet Take 1 tablet by mouth daily.    enoxaparin (LOVENOX) 60 MG/0.6ML injection Inject 0.6 mLs (60 mg total) into the skin daily.   ezetimibe (ZETIA) 10 MG tablet Take 10 mg by mouth daily.   Ferrous Gluconate (IRON) 240 (27 FE) MG TABS Take 1 tablet by mouth daily.   Flaxseed, Linseed, (FLAXSEED OIL) 1000  MG CAPS Take 1 capsule by mouth.    furosemide (LASIX) 20 MG tablet Take 20 mg by mouth every evening.    gabapentin (NEURONTIN) 100 MG capsule Take 100 mg by mouth 3 (three) times daily.   Lecithin 1200 MG CAPS Take 1 capsule by mouth daily.   levothyroxine (SYNTHROID, LEVOTHROID) 75 MCG tablet Take 50 mcg by mouth daily.    Lutein 6 MG CAPS Take 1 capsule by mouth daily.   Magnesium 250 MG TABS Take 1 tablet by mouth daily.   meloxicam (MOBIC) 15 MG tablet Take 15 mg by mouth daily.   metoprolol tartrate (LOPRESSOR) 25 MG tablet Take 25 mg by mouth 2 (two) times daily. Take with food / meals   NON FORMULARY Diet Type:  NAS   Omega-3 Fatty Acids (FISH OIL) 1200 MG CAPS Take 1,200 mg by mouth daily.   oxyCODONE (OXY IR/ROXICODONE) 5  MG immediate release tablet Take 1 tablet (5 mg total) by mouth every 6 (six) hours as needed for severe pain.   pantoprazole (PROTONIX) 40 MG tablet Take 1 tablet by mouth daily as needed.   polyethylene glycol (MIRALAX / GLYCOLAX) packet Take 17 g by mouth daily.   potassium chloride (K-DUR) 10 MEQ tablet Take 10 mEq by mouth 2 (two) times daily.   senna (SENOKOT) 8.6 MG TABS tablet Take 2 tablets (17.2 mg total) by mouth daily.   traMADol (ULTRAM) 50 MG tablet Take 1 tablet (50 mg total) by mouth every 12 (twelve) hours for 30 doses.   Turmeric 500 MG TABS Take by mouth daily.   warfarin (COUMADIN) 4 MG tablet Take 4 mg by mouth daily.   Zinc 50 MG TABS Take 1 tablet by mouth daily.   No facility-administered encounter medications on file as of 09/01/2018.     Review of Systems  General she is not complaining of fever chills but continues to have some weakness.  And lacks mobility.  Skin does not compla  in of rashes or itching  Head ears eyes nose mouth and throat does not complain of visual changes or sore throat  Respiratory is not complaining of shortness of breath or cough  I cardiac does not complain of chest pain has some  edema more so her right leg  GI is not complaining of abdominal pain nausea vomiting diarrhea constipation.    Musculoskeletal continues to complain of right low back pain more so with movement she describes this is more spasms  Neurologic again positive for weakness does not complain of dizziness headache says at times she has some numbness in her right leg--apparently this has been occurring off and on since compression fracture   Psych does not complain of overt depression or anxiety  Immunization History  Administered Date(s) Administered   Influenza Split 02/24/2012, 03/10/2013   Influenza-Unspecified 02/11/2018   Pneumococcal Polysaccharide-23 04/25/2008   Tdap 12/15/2012   Pertinent  Health Maintenance Due  Topic Date Due   PNA vac Low Risk Adult (2 of 2 - PCV13) 09/24/2018 (Originally 04/25/2009)   INFLUENZA VACCINE  12/25/2018   DEXA SCAN  Completed   No flowsheet data found. Functional Status Survey:    Vitals:   09/01/18 1006  BP: (!) 146/82  Pulse: 84  Resp: 18  Temp: 97.8 F (36.6 C)  Weight: 135 lb 9.6 oz (61.5 kg)  Height: 5\' 2"  (1.575 m)  Update blood pressure is 112/58 Body mass index is 24.8 kg/m. Physical Exam   In general this is a pleasant elderly female in no distress sitting in her wheelchair.    Her skin is warm and dry  Eyes visual acuity appears to be intact sclera and conjunctive are clear she has prescription lenses.  Oropharynx is clear mucous membranes moist.  Chest is clear to auscultation there is no labored breathing.  Heart is regular rate and rhythm without murmur gallop or rub.  She has mild lower extremity edema this is most prominent right lower extremity- pedal pulses intact edema is cool nonerythematous does not appear to be acutely tender.    Abdomen is soft nontender with positive bowel sounds  Musculoskeletal does move all extremities x4 she is able to get up and ambulate with a walker continues with some  mild weakness in her right lower extremity.  Neurologic as noted above touch sensation is intact right lower extremity-cannot really appreciate lateralizing findings.  Psych she is alert  and oriented pleasant and appropriate  Labs reviewed: Recent Labs    08/18/18 1205 08/19/18 0457 08/26/18 0722  NA 141 143 142  K 4.3 4.2 3.8  CL 101 102 104  CO2 30 30 27   GLUCOSE 107* 85 95  BUN 24* 20 24*  CREATININE 1.12* 1.16* 1.09*  CALCIUM 9.4 9.1 9.4  MG  --  2.7*  --    Recent Labs    08/18/18 1205  AST 33  ALT 26  ALKPHOS 89  BILITOT 0.6  PROT 7.3  ALBUMIN 4.4   Recent Labs    08/18/18 1205  08/22/18 0447 08/23/18 0423 08/24/18 0440  WBC 7.0   < > 6.7 7.2 6.7  NEUTROABS 5.2  --   --   --   --   HGB 14.6   < > 12.6 12.7 13.5  HCT 45.5   < > 39.6 40.5 41.2  MCV 90.1   < > 91.5 91.8 88.8  PLT 300   < > 284 290 284   < > = values in this interval not displayed.   Lab Results  Component Value Date   TSH 5.979 (H) 08/26/2018   Lab Results  Component Value Date   HGBA1C  01/04/2009    5.6 (NOTE) The ADA recommends the following therapeutic goal for glycemic control related to Hgb A1c measurement: Goal of therapy: <6.5 Hgb A1c  Reference: American Diabetes Association: Clinical Practice Recommendations 2010, Diabetes Care, 2010, 33: (Suppl  1).   Lab Results  Component Value Date   CHOL  01/04/2009    136        ATP III CLASSIFICATION:  <200     mg/dL   Desirable  200-239  mg/dL   Borderline High  >=240    mg/dL   High          HDL 55 01/04/2009   LDLCALC  01/04/2009    65        Total Cholesterol/HDL:CHD Risk Coronary Heart Disease Risk Table                     Men   Women  1/2 Average Risk   3.4   3.3  Average Risk       5.0   4.4  2 X Average Risk   9.6   7.1  3 X Average Risk  23.4   11.0        Use the calculated Patient Ratio above and the CHD Risk Table to determine the patient's CHD Risk.        ATP III CLASSIFICATION (LDL):  <100     mg/dL    Optimal  100-129  mg/dL   Near or Above                    Optimal  130-159  mg/dL   Borderline  160-189  mg/dL   High  >190     mg/dL   Very High   TRIG 78 01/04/2009   CHOLHDL 2.5 01/04/2009    Significant Diagnostic Results in last 30 days:  Dg Thoracic Spine W/swimmers  Result Date: 08/18/2018 CLINICAL DATA:  Thoracic spine pain since lifting water a week ago. EXAM: THORACIC SPINE - 3 VIEWS COMPARISON:  CT chest 09/20/2012 and CT abdomen and pelvis 01/15/2015. FINDINGS: S shaped thoracolumbar scoliosis is identified. Remote T11 and T12 compression fractures are noted. No acute fracture. Mild multilevel degenerative change noted. IMPRESSION: No acute finding. S  shaped scoliosis. Mild, remote T11 and T12 compression fractures. Electronically Signed   By: Inge Rise M.D.   On: 08/18/2018 10:34   Dg Lumbar Spine Complete  Result Date: 08/18/2018 CLINICAL DATA:  Low back pain for 1 week radiating into both legs after lifting water. EXAM: LUMBAR SPINE - COMPLETE 4+ VIEW COMPARISON:  CT abdomen and pelvis 01/15/2015. FINDINGS: Convex left scoliosis is unchanged. The patient has remote T12 superior endplate and U20 inferior endplate compression fractures which are unchanged. Lower lumbar facet arthropathy is noted. Mild loss of disc space height is seen at L5-S1. Paraspinous structures demonstrate a stone in the lower pole of the left kidney, unchanged. Massive volume of stool throughout the colon is seen. IMPRESSION: No acute abnormality or change compared to the prior exam. Mild, remote T11 and T12 compression fractures. Lower lumbar spondylosis. Nonobstructing stone left kidney, unchanged. Very large volume of stool throughout the colon. Electronically Signed   By: Inge Rise M.D.   On: 08/18/2018 10:30   Mr Thoracic Spine Wo Contrast  Result Date: 08/19/2018 CLINICAL DATA:  Pre vertebroplasty. T12 fracture. Back pain 2 months EXAM: MRI THORACIC AND LUMBAR SPINE WITHOUT CONTRAST  TECHNIQUE: Multiplanar and multiecho pulse sequences of the thoracic and lumbar spine were obtained without intravenous contrast. COMPARISON:  CT lumbar spine 08/18/2018 FINDINGS: MRI THORACIC SPINE FINDINGS Alignment: Mild anterolisthesis C7-T1, T2-3, T3-4. Mild retrolisthesis T11-12 Vertebrae: Mild to moderate compression fracture T12 with diffuse bone marrow edema. Paraspinous edema bilaterally compatible with recent fracture. Mild retropulsion of the superior endplate of U54 into the canal without significant stenosis. Cord: Negative for cord compression. Normal cord signal without cord lesion. Paraspinal and other soft tissues: Paraspinous soft tissue swelling around the T12 fracture bilaterally. No mass lesion. Disc levels: Advanced disc degeneration throughout the entire lumbar spine with disc space narrowing and spurring diffusely. Arthrodesis at T7-8. No cord compression or significant spinal stenosis. Mild foraminal narrowing bilaterally at multiple levels due to spurring. MRI LUMBAR SPINE FINDINGS Segmentation:  Normal Alignment:  Mild retrolisthesis L1-2 and L2-3 Vertebrae: Mild to moderate fracture of T12 which appears acute or subacute. No lumbar fracture identified. Conus medullaris and cauda equina: Conus extends to the L1 level. Conus and cauda equina appear normal. Paraspinal and other soft tissues: Negative for paraspinous mass or edema Disc levels: L1-2: Mild disc degeneration with Schmorl's node L2-3: Disc degeneration with Schmorl's node. Mild disc bulging and endplate spurring without significant stenosis L2-3: Mild disc and moderate facet degeneration. Negative for stenosis L4-5: Diffuse bulging of the disc with moderate facet degeneration. Negative for stenosis L5-S1: Disc degeneration and spurring left greater than right with left foraminal encroachment. Bilateral facet hypertrophy. IMPRESSION: MR THORACIC SPINE IMPRESSION Mild to moderate compression fracture T12 with bone marrow edema and  paraspinous soft tissue edema compatible with acute or subacute fracture. Mild retropulsion of T12 into the canal without significant spinal stenosis. Severe diffuse thoracic disc degeneration without significant spinal stenosis. MR LUMBAR SPINE IMPRESSION Negative for lumbar fracture. Multilevel degenerative change as above without significant spinal stenosis. Electronically Signed   By: Franchot Gallo M.D.   On: 08/19/2018 10:53   Mr Lumbar Spine Wo Contrast  Result Date: 08/19/2018 CLINICAL DATA:  Pre vertebroplasty. T12 fracture. Back pain 2 months EXAM: MRI THORACIC AND LUMBAR SPINE WITHOUT CONTRAST TECHNIQUE: Multiplanar and multiecho pulse sequences of the thoracic and lumbar spine were obtained without intravenous contrast. COMPARISON:  CT lumbar spine 08/18/2018 FINDINGS: MRI THORACIC SPINE FINDINGS Alignment: Mild anterolisthesis C7-T1, T2-3,  T3-4. Mild retrolisthesis T11-12 Vertebrae: Mild to moderate compression fracture T12 with diffuse bone marrow edema. Paraspinous edema bilaterally compatible with recent fracture. Mild retropulsion of the superior endplate of Z61 into the canal without significant stenosis. Cord: Negative for cord compression. Normal cord signal without cord lesion. Paraspinal and other soft tissues: Paraspinous soft tissue swelling around the T12 fracture bilaterally. No mass lesion. Disc levels: Advanced disc degeneration throughout the entire lumbar spine with disc space narrowing and spurring diffusely. Arthrodesis at T7-8. No cord compression or significant spinal stenosis. Mild foraminal narrowing bilaterally at multiple levels due to spurring. MRI LUMBAR SPINE FINDINGS Segmentation:  Normal Alignment:  Mild retrolisthesis L1-2 and L2-3 Vertebrae: Mild to moderate fracture of T12 which appears acute or subacute. No lumbar fracture identified. Conus medullaris and cauda equina: Conus extends to the L1 level. Conus and cauda equina appear normal. Paraspinal and other soft  tissues: Negative for paraspinous mass or edema Disc levels: L1-2: Mild disc degeneration with Schmorl's node L2-3: Disc degeneration with Schmorl's node. Mild disc bulging and endplate spurring without significant stenosis L2-3: Mild disc and moderate facet degeneration. Negative for stenosis L4-5: Diffuse bulging of the disc with moderate facet degeneration. Negative for stenosis L5-S1: Disc degeneration and spurring left greater than right with left foraminal encroachment. Bilateral facet hypertrophy. IMPRESSION: MR THORACIC SPINE IMPRESSION Mild to moderate compression fracture T12 with bone marrow edema and paraspinous soft tissue edema compatible with acute or subacute fracture. Mild retropulsion of T12 into the canal without significant spinal stenosis. Severe diffuse thoracic disc degeneration without significant spinal stenosis. MR LUMBAR SPINE IMPRESSION Negative for lumbar fracture. Multilevel degenerative change as above without significant spinal stenosis. Electronically Signed   By: Franchot Gallo M.D.   On: 08/19/2018 10:53   Ct Abdomen Pelvis W Contrast  Result Date: 08/18/2018 CLINICAL DATA:  Right greater than left lower abdominal pain. EXAM: CT ABDOMEN AND PELVIS WITH CONTRAST TECHNIQUE: Multidetector CT imaging of the abdomen and pelvis was performed using the standard protocol following bolus administration of intravenous contrast. CONTRAST:  31mL OMNIPAQUE IOHEXOL 300 MG/ML  SOLN COMPARISON:  MRI abdomen dated March 26, 2018. CT abdomen pelvis dated January 15, 2015. FINDINGS: Lower chest: No acute abnormality. Hepatobiliary: Multiple hepatic simple cysts and small hemangiomas are unchanged. Unchanged small gallbladder polyp versus gallstone. No gallbladder wall thickening. Unchanged mild common bile duct and central intrahepatic biliary dilatation. Pancreas: Unremarkable. No pancreatic ductal dilatation or surrounding inflammatory changes. Spleen: Normal in size without focal abnormality.  Adrenals/Urinary Tract: The adrenal glands are unremarkable. There is a 1.2 cm exophytic, high-attenuating lesion arising from the midpole of the right kidney, previously characterized as a hemorrhagic/proteinaceous cyst on prior MRI. Subcentimeter low-density lesions in both kidneys remain too small to characterize, but are unchanged. Slight interval enlargement of the nonobstructive calculi in the lower pole of the left kidney and midpole of the right kidney. Decreased mild left hydronephrosis to the level of the UPJ. No right hydronephrosis. The bladder is unremarkable for the degree of distention. Stomach/Bowel: Stomach is within normal limits. Appendix is surgically absent. No evidence of bowel wall thickening, distention, or inflammatory changes. Moderate left-sided colonic diverticulosis. Large colonic stool burden. Vascular/Lymphatic: Aortic atherosclerosis. No enlarged abdominal or pelvic lymph nodes. Reproductive: Status post hysterectomy. No adnexal masses. Other: No abdominal wall hernia or abnormality. No abdominopelvic ascites. No pneumoperitoneum. Musculoskeletal: No acute or significant osseous findings. Slight interval progression of the now moderate T12 compression deformity since November 2019. IMPRESSION: 1.  No acute intra-abdominal  process. 2. Prominent stool throughout the colon. Correlate for constipation. 3. Slight interval increase in size of bilateral nonobstructive nephrolithiasis. 4. Slight interval progression of the now moderate T12 compression deformity since November 2019. Electronically Signed   By: Titus Dubin M.D.   On: 08/18/2018 15:22   Ir Kypho Lumbar Inc Fx Reduce Bone Bx Uni/bil Cannulation Inc/imaging  Result Date: 08/23/2018 CLINICAL DATA:  Symptomatic compression fracture, failed conservative management. Please perform fluoroscopic guided kyphoplasty for symptomatic purposes. EXAM: FLUOROSCOPIC GUIDED KYPHOPLASTY OF THE T12 VERTEBRAL BODY COMPARISON:  Thoracic and  lumbar spine MRI - 08/19/2018 MEDICATIONS: As antibiotic prophylaxis, Ancef 2 gm IV was ordered pre-procedure and administered intravenously within 1 hour of incision. ANESTHESIA/SEDATION: Moderate (conscious) sedation was employed during this procedure. A total of Versed 2 mg and Fentanyl 75 mcg was administered intravenously. Moderate Sedation Time: 27 minutes. The patient's level of consciousness and vital signs were monitored continuously by radiology nursing throughout the procedure under my direct supervision. FLUOROSCOPY TIME:  5 minutes, 48 seconds (308 mGy) COMPLICATIONS: None immediate. TECHNIQUE: The procedure, risks (including but not limited to bleeding, infection, organ damage), benefits, and alternatives were explained to the patient. Questions regarding the procedure were encouraged and answered. The patient understands and consents to the procedure. The patient was placed prone on the fluoroscopic table. The skin overlying the thoracolumbar region was then prepped and draped in the usual sterile fashion. Maximal barrier sterile technique was utilized including caps, mask, sterile gowns, sterile gloves, sterile drape, hand hygiene and skin antiseptic. Intravenous Fentanyl and Versed were administered as conscious sedation during continuous cardiorespiratory monitoring by the radiology RN. The left pedicle at T12 was then infiltrated with 1% lidocaine followed by the advancement of a Kyphon trocar needle through the left pedicle into the posterior one-third of the vertebral body. Subsequently, the osteo drill was advanced to the anterior third of the vertebral body. The osteo drill was retracted. Through the working cannula, a Kyphon inflatable bone tamp 15 x 2 was advanced and positioned with the distal marker approximately 5 mm from the anterior aspect of the cortex. Appropriate positioning was confirmed on the AP projection. At this time, the balloon was expanded using contrast via a Kyphon  inflation syringe device via micro tubing. Inflations were continued until there was near apposition with the superior end plate. At this time, methylmethacrylate mixture was reconstituted in the Kyphon bone mixing device system. This was then loaded into the delivery mechanism, attached to Kyphon bone fillers. The balloons were deflated and removed followed by the instillation of methylmethacrylate mixture with excellent filling in the AP and lateral projections. No extravasation was noted in the disk spaces or posteriorly into the spinal canal. No epidural venous contamination was seen. The working cannulae and the bone filler were then retrieved and removed. Hemostasis was achieved with manual compression. The patient tolerated the procedure well without immediate postprocedural complication. IMPRESSION: 1. Technically successful T12 vertebral body augmentation using balloon kyphoplasty. 2. Per CMS PQRS reporting requirements (PQRS Measure 24): Given the patient's age of greater than 89 and the fracture site (hip, distal radius, or spine), the patient should be tested for osteoporosis using DXA, and the appropriate treatment considered based on the DXA results. Electronically Signed   By: Sandi Mariscal M.D.   On: 08/23/2018 15:56    Assessment/Plan  #1 history of T12 compression fracture status post kyphoplasty- she has been started on Neurontin 100 mg 3 times daily- she is also on tramadol twice daily and  has Oxycodone as needed 5 mg every 8 hours she is waiting on her TLSO brace.  She does complain of muscle spasms has completed her Robaxin was for 5 days we will renew this for another 5 days since she is complaining of significant back pain she describes is more gripping spasm type pain.  2.  History of chronic diastolic CHF her weight appears to be stable she does have some mild/moderate edema- one point she was on Lasix a total of 60 mg a day she is now on 20 continue to monitor weights and clinical  status  3.  History of ischemic CVA-she continues on Coumadin as well as Lovenox as a bridge until INR is 2 or greater- INR was 1.6 on lab done on will update this tomorrow appears to be rising steadily   4- history of hypertension this appears stable did above on Norvasc 5 mg a day Lasix 20 mg a day-Lopressor 25 mg twice daily.  5 depression she continues on Wellbutrin.  6.  Hypothyroidism TSH was mildly elevated at 5.979 she continues on Synthroid -- at this point TSH is minimally elevated will continue current dose and likely defer to primary care provider for any adjustments  [phone]

## 2018-09-01 NOTE — Progress Notes (Deleted)
This encounter was created in error - please disregard.

## 2018-09-02 ENCOUNTER — Encounter (HOSPITAL_COMMUNITY)
Admission: RE | Admit: 2018-09-02 | Discharge: 2018-09-02 | Disposition: A | Discharge status: Home / Self Care | Payer: PPO | Source: Skilled Nursing Facility | Attending: Internal Medicine | Admitting: Internal Medicine

## 2018-09-02 DIAGNOSIS — Z4789 Encounter for other orthopedic aftercare: Secondary | ICD-10-CM | POA: Insufficient documentation

## 2018-09-02 DIAGNOSIS — Z7901 Long term (current) use of anticoagulants: Secondary | ICD-10-CM | POA: Insufficient documentation

## 2018-09-02 DIAGNOSIS — Z8673 Personal history of transient ischemic attack (TIA), and cerebral infarction without residual deficits: Secondary | ICD-10-CM | POA: Insufficient documentation

## 2018-09-02 DIAGNOSIS — Z741 Need for assistance with personal care: Secondary | ICD-10-CM | POA: Insufficient documentation

## 2018-09-02 LAB — PROTIME-INR
INR: 2 — ABNORMAL HIGH (ref 0.8–1.2)
Prothrombin Time: 22.1 seconds — ABNORMAL HIGH (ref 11.4–15.2)

## 2018-09-08 ENCOUNTER — Non-Acute Institutional Stay (SKILLED_NURSING_FACILITY): Payer: PPO | Admitting: Adult Health

## 2018-09-08 ENCOUNTER — Other Ambulatory Visit: Payer: Self-pay | Admitting: Adult Health

## 2018-09-08 ENCOUNTER — Encounter: Payer: Self-pay | Admitting: Adult Health

## 2018-09-08 DIAGNOSIS — J42 Unspecified chronic bronchitis: Secondary | ICD-10-CM

## 2018-09-08 DIAGNOSIS — I5032 Chronic diastolic (congestive) heart failure: Secondary | ICD-10-CM | POA: Diagnosis not present

## 2018-09-08 DIAGNOSIS — S22080G Wedge compression fracture of T11-T12 vertebra, subsequent encounter for fracture with delayed healing: Secondary | ICD-10-CM | POA: Diagnosis not present

## 2018-09-08 MED ORDER — GABAPENTIN 100 MG PO CAPS: 100.0000 mg | ORAL_CAPSULE | Freq: Three times a day (TID) | ORAL | 0 refills | Status: DC

## 2018-09-08 MED ORDER — EZETIMIBE 10 MG PO TABS: 10.0000 mg | ORAL_TABLET | Freq: Every day | ORAL | 0 refills | Status: DC

## 2018-09-08 MED ORDER — OXYCODONE HCL 5 MG PO TABS: 5.0000 mg | ORAL_TABLET | Freq: Four times a day (QID) | ORAL | 0 refills | Status: DC | PRN

## 2018-09-08 MED ORDER — ALBUTEROL SULFATE (2.5 MG/3ML) 0.083% IN NEBU: 2.5000 mg | INHALATION_SOLUTION | Freq: Four times a day (QID) | RESPIRATORY_TRACT | 0 refills | Status: DC | PRN

## 2018-09-08 MED ORDER — IRON 240 (27 FE) MG PO TABS: 1.0000 | ORAL_TABLET | Freq: Every day | ORAL | 0 refills | Status: DC

## 2018-09-08 MED ORDER — FUROSEMIDE 20 MG PO TABS: 20.0000 mg | ORAL_TABLET | Freq: Every evening | ORAL | 0 refills | Status: DC

## 2018-09-08 MED ORDER — BUPROPION HCL ER (XL) 150 MG PO TB24: 150.0000 mg | ORAL_TABLET | Freq: Every day | ORAL | 0 refills | Status: DC

## 2018-09-08 MED ORDER — AMLODIPINE BESYLATE 5 MG PO TABS: 5.0000 mg | ORAL_TABLET | Freq: Every day | ORAL | 0 refills | Status: DC

## 2018-09-08 MED ORDER — METOPROLOL TARTRATE 25 MG PO TABS: 25.0000 mg | ORAL_TABLET | Freq: Two times a day (BID) | ORAL | 0 refills | Status: DC

## 2018-09-08 MED ORDER — POTASSIUM CHLORIDE ER 10 MEQ PO TBCR: 10.0000 meq | EXTENDED_RELEASE_TABLET | Freq: Two times a day (BID) | ORAL | 0 refills | Status: DC

## 2018-09-08 MED ORDER — FUROSEMIDE 40 MG PO TABS: 40.0000 mg | ORAL_TABLET | ORAL | 0 refills | Status: DC

## 2018-09-08 MED ORDER — WARFARIN SODIUM 4 MG PO TABS: 4.0000 mg | ORAL_TABLET | Freq: Every day | ORAL | 0 refills | Status: DC

## 2018-09-08 MED ORDER — LEVOTHYROXINE SODIUM 50 MCG PO TABS: 50.0000 ug | ORAL_TABLET | Freq: Every day | ORAL | 0 refills | Status: DC

## 2018-09-08 MED ORDER — PANTOPRAZOLE SODIUM 40 MG PO TBEC: 40.0000 mg | DELAYED_RELEASE_TABLET | Freq: Every day | ORAL | 0 refills | Status: DC | PRN

## 2018-09-08 MED ORDER — TRAMADOL HCL 50 MG PO TABS: 50.0000 mg | ORAL_TABLET | Freq: Two times a day (BID) | ORAL | 0 refills | Status: AC

## 2018-09-08 NOTE — Progress Notes (Signed)
Location:   [place] Room Number: 159 P Place of Service:  SNF (31)    CODE STATUS: Full Code  Allergies  Allergen Reactions  . Epinephrine Anaphylaxis  . Demerol [Meperidine] Other (See Comments)    headaches  . Vicodin [Hydrocodone-Acetaminophen] Other (See Comments)    [name] it makes her crazy. Pt tolerates acetaminophen    Chief Complaint  Patient presents with  . Discharge Note    Discharging to home on 09/10/2018    HPI:  She is being discharged to home with home health for pt/ot/rn. She will need a standard wheelchair; semi-electric bed; and neb machine. She will need her prescriptions to be written and will need to follow up with her medical provider. She had been hospitalized for a T 12 compression fracture status post kyphoplasty. She was admitted to this facility for short term rehab and is now ready to return back home.     Past Medical History:  Diagnosis Date  . Adenomatous colon polyp 2008  . Asthma   . Complication of anesthesia   . CVA (cerebral vascular accident) ([place])    sl weaker on rt foot  . Diverticulosis   . GERD (gastroesophageal reflux disease)   . Hemorrhoids 2007  . HTN (hypertension)   . Hyperplastic colon polyp 2005  . Hypothyroidism   . Narcolepsy   . PONV (postoperative nausea and vomiting)   . TIA (transient ischemic attack)     Past Surgical History:  Procedure Laterality Date  . ABDOMINAL HYSTERECTOMY     age 41, complicated by poor wound healing, followed by revision of scar and radiation for ?malignancy  . BREAST ENHANCEMENT SURGERY    . BREAST IMPLANT EXCHANGE Right 06/23/2016   Procedure: RIGHT BREAST IMPLANT REMOVAL AND REPLACEMENT;  Surgeon: Cristine Polio, MD;  Location: [facility];  Service: Plastics;  Laterality: Right;  . COLONOSCOPY  2005   2 hyperplastic polyps  . COLONOSCOPY  2007   Dr. Oneida Alar- hemorrhoids  . COLONOSCOPY  2008   Dr. Reed Pandy polyp- rare  sigmoid diverticulosis, internal hemorrhoids  . COLONOSCOPY  12/01/2011   Procedure: COLONOSCOPY;  Surgeon: Danie Binder, MD;  [facility];  Service: Endoscopy;  Laterality: N/A;  12:30 PM  . ESOPHAGOGASTRODUODENOSCOPY (EGD) WITH ESOPHAGEAL DILATION N/A 04/15/2013   Procedure: ESOPHAGOGASTRODUODENOSCOPY (EGD) WITH ESOPHAGEAL DILATION;  Surgeon: Danie Binder, MD;  [facility];  Service: Endoscopy;  Laterality: N/A;  11:45-moved to 12:30 Darius Bump to notify pt  . EYE SURGERY  2010  . EYE SURGERY    . IR KYPHO LUMBAR INC FX REDUCE BONE BX UNI/BIL CANNULATION INC/IMAGING  08/23/2018  . right thumb surgery      Social History   Socioeconomic History  . Marital status: Widowed    Spouse name: Not on file  . Number of children: Not on file  . Years of education: Not on file  . Highest education level: Not on file  Occupational History  . Not on file  Social Needs  . Financial resource strain: Not on file  . Food insecurity:    Worry: Not on file    Inability: Not on file  . Transportation needs:    Medical: Not on file    Non-medical: Not on file  Tobacco Use  . Smoking status: Never Smoker  . Smokeless tobacco: Never Used  Substance and Sexual Activity  . Alcohol use: No  . Drug use: No  .  Sexual activity: Never  Lifestyle  . Physical activity:    Days per week: Not on file    Minutes per session: Not on file  . Stress: Not on file  Relationships  . Social connections:    Talks on phone: Not on file    Gets together: Not on file    Attends religious service: Not on file    Active member of club or organization: Not on file    Attends meetings of clubs or organizations: Not on file    Relationship status: Not on file  . Intimate partner violence:    Fear of current or ex partner: Not on file    Emotionally abused: Not on file    Physically abused: Not on file    Forced sexual activity: Not on file  Other Topics Concern  . Not on file  Social  History Narrative  . Not on file   Family History  Problem Relation Age of Onset  . Colon cancer Father        age 51  . Prostate cancer Father   . Pancreatic cancer Mother        age 58    VITAL SIGNS BP (!) 118/55   Pulse (!) 58   Temp 98.7 F (37.1 C)   Resp 20   Ht 5\' 2"  (1.575 m)   Wt 143 lb (64.9 kg)   BMI 26.16 kg/m   Patient's Medications  New Prescriptions   No medications on file  Previous Medications   ALBUTEROL (PROVENTIL) (2.5 MG/3ML) 0.083% NEBULIZER SOLUTION    Take 3 mLs (2.5 mg total) by nebulization every 6 (six) hours as needed for wheezing or shortness of breath.   AMLODIPINE (NORVASC) 5 MG TABLET    TAKE 1 TABLET BY MOUTH EVERY DAY   ASCORBIC ACID (VITAMIN C) 1000 MG TABLET    Take 1,000 mg by mouth daily.   B COMPLEX VITAMINS (VITAMIN B COMPLEX) TABS    Take 1 tablet by mouth daily.   BIOTIN 300 MCG TABS    Take 1 tablet by mouth daily after supper.    BUPROPION (WELLBUTRIN XL) 150 MG 24 HR TABLET    Take 150 mg by mouth daily.   CALCIUM CARBONATE 500 MG CHEW    Chew 1 tablet by mouth daily.   CHOLECALCIFEROL (VITAMIN D) 1000 UNITS TABLET    Take 1,000 Units by mouth daily.   CYANOCOBALAMIN 100 MCG TABLET    Take 1 tablet by mouth daily.    EZETIMIBE (ZETIA) 10 MG TABLET    Take 10 mg by mouth daily.   FERROUS GLUCONATE (IRON) 240 (27 FE) MG TABS    Take 1 tablet by mouth daily.   FLAXSEED, LINSEED, (FLAXSEED OIL) 1000 MG CAPS    Take 1 capsule by mouth.    FUROSEMIDE (LASIX) 20 MG TABLET    Take 20 mg by mouth every evening.    FUROSEMIDE (LASIX) 40 MG TABLET    Take 40 mg by mouth every morning.   GABAPENTIN (NEURONTIN) 100 MG CAPSULE    Take 100 mg by mouth 3 (three) times daily.   LECITHIN 1200 MG CAPS    Take 1 capsule by mouth daily.   LEVOTHYROXINE (SYNTHROID, LEVOTHROID) 75 MCG TABLET    Take 50 mcg by mouth daily.    LUTEIN 6 MG CAPS    Take 1 capsule by mouth daily.   MAGNESIUM 250 MG TABS    Take 1 tablet by  mouth daily.   METOPROLOL  TARTRATE (LOPRESSOR) 25 MG TABLET    Take 25 mg by mouth 2 (two) times daily. Take with food / meals   NON FORMULARY    Diet Type:  NAS   OMEGA-3 FATTY ACIDS (FISH OIL) 1200 MG CAPS    Take 1,200 mg by mouth daily.   OXYCODONE (OXY IR/ROXICODONE) 5 MG IMMEDIATE RELEASE TABLET    Take 1 tablet (5 mg total) by mouth every 6 (six) hours as needed for severe pain.   PANTOPRAZOLE (PROTONIX) 40 MG TABLET    Take 1 tablet by mouth daily as needed.   POLYETHYLENE GLYCOL (MIRALAX / GLYCOLAX) PACKET    Take 17 g by mouth daily.   POTASSIUM CHLORIDE (K-DUR) 10 MEQ TABLET    Take 10 mEq by mouth 2 (two) times daily.   SENNA (SENOKOT) 8.6 MG TABS TABLET    Take 2 tablets (17.2 mg total) by mouth daily.   TRAMADOL (ULTRAM) 50 MG TABLET    Take 1 tablet (50 mg total) by mouth every 12 (twelve) hours for 30 doses.   TURMERIC 500 MG TABS    Take by mouth daily.   WARFARIN (COUMADIN) 4 MG TABLET    Take 4 mg by mouth daily.   ZINC 50 MG TABS    Take 1 tablet by mouth daily.  Modified Medications   No medications on file  Discontinued Medications   ENOXAPARIN (LOVENOX) 60 MG/0.6ML INJECTION    Inject 0.6 mLs (60 mg total) into the skin daily.     SIGNIFICANT DIAGNOSTIC EXAMS  LABS REVIEWED TODAY:   09-10-18: glucose 90; bun 20; creat 1.10  k+ 3.9 na++ 141 ca 9.2   Review of Systems  Constitutional: Negative for malaise/fatigue.  Respiratory: Negative for cough and shortness of breath.   Cardiovascular: Negative for chest pain, palpitations and leg swelling.  Gastrointestinal: Negative for abdominal pain, constipation and heartburn.  Musculoskeletal: Negative for back pain, joint pain and myalgias.  Skin: Negative.   Neurological: Negative for dizziness.  Psychiatric/Behavioral: The patient is not nervous/anxious.     Physical Exam Constitutional:      General: She is not in acute distress.    Appearance: She is well-developed. She is not diaphoretic.  Neck:     Musculoskeletal: Neck supple.      Thyroid: No thyromegaly.  Cardiovascular:     Rate and Rhythm: Normal rate and regular rhythm.     Pulses: Normal pulses.     Heart sounds: Normal heart sounds.  Pulmonary:     Effort: Pulmonary effort is normal. No respiratory distress.     Breath sounds: Normal breath sounds.  Abdominal:     General: Bowel sounds are normal. There is no distension.     Palpations: Abdomen is soft.     Tenderness: There is no abdominal tenderness.  Musculoskeletal: Normal range of motion.     Right lower leg: No edema.     Left lower leg: No edema.  Lymphadenopathy:     Cervical: No cervical adenopathy.  Skin:    General: Skin is warm and dry.  Neurological:     Mental Status: She is alert and oriented to person, place, and time.  Psychiatric:        Mood and Affect: Mood normal.        ASSESSMENT/ PLAN:   Patient is being discharged with the following home health services:  Pt/ot/rn: to evaluate and treat as indicated for gait balance strength adl  training medication management   Patient is being discharged with the following durable medical equipment:  Standard wheelchair with elevated leg rests; cushion antitippers to allow her to maintain her current level of independence with her adls which cannot be achieved with a walker; is able to propel self.  Semi-electric bed to allow for frequent positioning due to her T 12 compression fracture and back pain which cannot be achieved with a standard bed.  Neb machine and supplies.   Patient has been advised to f/u with their PCP in 1-2 weeks to bring them up to date on their rehab stay.  Social services at facility was responsible for arranging this appointment.  Pt was provided with a 30 day supply of prescriptions for medications and refills must be obtained from their PCP.  For controlled substances, a more limited supply may be provided adequate until PCP appointment only.   A 30 day supply of her prescription medications have been sent to  [facility] on [address] #10 oxycodone 5 mg tabs and #30 ultram 50 mg tabs.   Time spent with patient: 45 minutes: discussed home health needs; dme and medications; verbalized understanding.    Ok Edwards NP [facility]  Contact [phone] Monday through Friday 8am- 5pm  After hours call [phone]

## 2018-09-09 ENCOUNTER — Other Ambulatory Visit (HOSPITAL_COMMUNITY)
Admission: RE | Admit: 2018-09-09 | Discharge: 2018-09-09 | Disposition: A | Discharge status: Home / Self Care | Payer: PPO | Source: Skilled Nursing Facility | Attending: Internal Medicine | Admitting: Internal Medicine

## 2018-09-09 LAB — PROTIME-INR
INR: 1.3 — ABNORMAL HIGH (ref 0.8–1.2)
Prothrombin Time: 16.1 seconds — ABNORMAL HIGH (ref 11.4–15.2)

## 2018-09-10 ENCOUNTER — Encounter (HOSPITAL_COMMUNITY)
Admission: RE | Admit: 2018-09-10 | Discharge: 2018-09-10 | Disposition: A | Discharge status: Home / Self Care | Payer: PPO | Source: Skilled Nursing Facility | Attending: Internal Medicine | Admitting: Internal Medicine

## 2018-09-10 DIAGNOSIS — Z741 Need for assistance with personal care: Secondary | ICD-10-CM | POA: Insufficient documentation

## 2018-09-10 DIAGNOSIS — Z8673 Personal history of transient ischemic attack (TIA), and cerebral infarction without residual deficits: Secondary | ICD-10-CM | POA: Insufficient documentation

## 2018-09-10 DIAGNOSIS — K5900 Constipation, unspecified: Secondary | ICD-10-CM | POA: Diagnosis not present

## 2018-09-10 DIAGNOSIS — S22088D Other fracture of T11-T12 vertebra, subsequent encounter for fracture with routine healing: Secondary | ICD-10-CM | POA: Diagnosis not present

## 2018-09-10 DIAGNOSIS — Z4789 Encounter for other orthopedic aftercare: Secondary | ICD-10-CM | POA: Insufficient documentation

## 2018-09-10 DIAGNOSIS — Z7901 Long term (current) use of anticoagulants: Secondary | ICD-10-CM | POA: Insufficient documentation

## 2018-09-10 LAB — BASIC METABOLIC PANEL
Anion gap: 9 (ref 5–15)
BUN: 20 mg/dL (ref 8–23)
CO2: 30 mmol/L (ref 22–32)
Calcium: 9.2 mg/dL (ref 8.9–10.3)
Chloride: 102 mmol/L (ref 98–111)
Creatinine, Ser: 1.1 mg/dL — ABNORMAL HIGH (ref 0.44–1.00)
GFR calc Af Amer: 55 mL/min — ABNORMAL LOW (ref >60)
GFR calc non Af Amer: 47 mL/min — ABNORMAL LOW (ref >60)
Glucose, Bld: 90 mg/dL (ref 70–99)
Potassium: 3.9 mmol/L (ref 3.5–5.1)
Sodium: 141 mmol/L (ref 135–145)

## 2018-09-12 DIAGNOSIS — S22000D Wedge compression fracture of unspecified thoracic vertebra, subsequent encounter for fracture with routine healing: Secondary | ICD-10-CM | POA: Diagnosis not present

## 2018-09-12 DIAGNOSIS — Z79891 Long term (current) use of opiate analgesic: Secondary | ICD-10-CM | POA: Diagnosis not present

## 2018-09-12 DIAGNOSIS — K5909 Other constipation: Secondary | ICD-10-CM | POA: Diagnosis not present

## 2018-09-12 DIAGNOSIS — Z8673 Personal history of transient ischemic attack (TIA), and cerebral infarction without residual deficits: Secondary | ICD-10-CM | POA: Diagnosis not present

## 2018-09-12 DIAGNOSIS — I110 Hypertensive heart disease with heart failure: Secondary | ICD-10-CM | POA: Diagnosis not present

## 2018-09-12 DIAGNOSIS — S22088D Other fracture of T11-T12 vertebra, subsequent encounter for fracture with routine healing: Secondary | ICD-10-CM | POA: Diagnosis not present

## 2018-09-12 DIAGNOSIS — F329 Major depressive disorder, single episode, unspecified: Secondary | ICD-10-CM | POA: Diagnosis not present

## 2018-09-12 DIAGNOSIS — I5032 Chronic diastolic (congestive) heart failure: Secondary | ICD-10-CM | POA: Diagnosis not present

## 2018-09-12 DIAGNOSIS — N183 Chronic kidney disease, stage 3 (moderate): Secondary | ICD-10-CM | POA: Diagnosis not present

## 2018-09-12 DIAGNOSIS — Z7901 Long term (current) use of anticoagulants: Secondary | ICD-10-CM | POA: Diagnosis not present

## 2018-09-12 DIAGNOSIS — G8929 Other chronic pain: Secondary | ICD-10-CM | POA: Diagnosis not present

## 2018-09-12 DIAGNOSIS — F419 Anxiety disorder, unspecified: Secondary | ICD-10-CM | POA: Diagnosis not present

## 2018-09-12 DIAGNOSIS — Z5181 Encounter for therapeutic drug level monitoring: Secondary | ICD-10-CM | POA: Diagnosis not present

## 2018-09-12 DIAGNOSIS — E039 Hypothyroidism, unspecified: Secondary | ICD-10-CM | POA: Diagnosis not present

## 2018-09-15 ENCOUNTER — Other Ambulatory Visit: Payer: Self-pay | Admitting: Pharmacist

## 2018-09-15 NOTE — Patient Outreach (Signed)
[facility]) Care Management [place]  09/15/2018  [name] 06-12-1935 [id number]  Reason for referral: pharmacy assistance  [facility] pharmacy case is being closed due to the following reasons:  The patient is participating in another care management program ([place])   Regina Eck, PharmD, [place]  [phone]

## 2018-09-18 DIAGNOSIS — R109 Unspecified abdominal pain: Secondary | ICD-10-CM | POA: Diagnosis not present

## 2018-09-20 DIAGNOSIS — Z0000 Encounter for general adult medical examination without abnormal findings: Secondary | ICD-10-CM | POA: Diagnosis not present

## 2018-09-21 ENCOUNTER — Other Ambulatory Visit: Payer: Self-pay | Admitting: Internal Medicine

## 2018-09-21 DIAGNOSIS — R911 Solitary pulmonary nodule: Secondary | ICD-10-CM

## 2018-09-23 NOTE — Progress Notes (Signed)
This encounter was created in error - please disregard.

## 2018-09-24 DIAGNOSIS — I340 Nonrheumatic mitral (valve) insufficiency: Secondary | ICD-10-CM | POA: Diagnosis not present

## 2018-09-24 DIAGNOSIS — E782 Mixed hyperlipidemia: Secondary | ICD-10-CM | POA: Diagnosis not present

## 2018-09-24 DIAGNOSIS — I10 Essential (primary) hypertension: Secondary | ICD-10-CM | POA: Diagnosis not present

## 2018-09-24 DIAGNOSIS — F331 Major depressive disorder, recurrent, moderate: Secondary | ICD-10-CM | POA: Diagnosis not present

## 2018-09-24 DIAGNOSIS — M545 Low back pain: Secondary | ICD-10-CM | POA: Diagnosis not present

## 2018-09-24 DIAGNOSIS — K219 Gastro-esophageal reflux disease without esophagitis: Secondary | ICD-10-CM | POA: Diagnosis not present

## 2018-09-24 DIAGNOSIS — E039 Hypothyroidism, unspecified: Secondary | ICD-10-CM | POA: Diagnosis not present

## 2018-09-24 DIAGNOSIS — K5900 Constipation, unspecified: Secondary | ICD-10-CM | POA: Diagnosis not present

## 2018-09-24 DIAGNOSIS — Z8673 Personal history of transient ischemic attack (TIA), and cerebral infarction without residual deficits: Secondary | ICD-10-CM | POA: Diagnosis not present

## 2018-09-30 DIAGNOSIS — E039 Hypothyroidism, unspecified: Secondary | ICD-10-CM | POA: Diagnosis not present

## 2018-09-30 DIAGNOSIS — N183 Chronic kidney disease, stage 3 (moderate): Secondary | ICD-10-CM | POA: Diagnosis not present

## 2018-09-30 DIAGNOSIS — Z8673 Personal history of transient ischemic attack (TIA), and cerebral infarction without residual deficits: Secondary | ICD-10-CM | POA: Diagnosis not present

## 2018-09-30 DIAGNOSIS — F419 Anxiety disorder, unspecified: Secondary | ICD-10-CM | POA: Diagnosis not present

## 2018-09-30 DIAGNOSIS — K5909 Other constipation: Secondary | ICD-10-CM | POA: Diagnosis not present

## 2018-09-30 DIAGNOSIS — S22088D Other fracture of T11-T12 vertebra, subsequent encounter for fracture with routine healing: Secondary | ICD-10-CM | POA: Diagnosis not present

## 2018-09-30 DIAGNOSIS — Z7901 Long term (current) use of anticoagulants: Secondary | ICD-10-CM | POA: Diagnosis not present

## 2018-09-30 DIAGNOSIS — I5032 Chronic diastolic (congestive) heart failure: Secondary | ICD-10-CM | POA: Diagnosis not present

## 2018-09-30 DIAGNOSIS — S22000D Wedge compression fracture of unspecified thoracic vertebra, subsequent encounter for fracture with routine healing: Secondary | ICD-10-CM | POA: Diagnosis not present

## 2018-09-30 DIAGNOSIS — I110 Hypertensive heart disease with heart failure: Secondary | ICD-10-CM | POA: Diagnosis not present

## 2018-09-30 DIAGNOSIS — G8929 Other chronic pain: Secondary | ICD-10-CM | POA: Diagnosis not present

## 2018-09-30 DIAGNOSIS — F329 Major depressive disorder, single episode, unspecified: Secondary | ICD-10-CM | POA: Diagnosis not present

## 2018-09-30 DIAGNOSIS — Z5181 Encounter for therapeutic drug level monitoring: Secondary | ICD-10-CM | POA: Diagnosis not present

## 2018-09-30 DIAGNOSIS — Z79891 Long term (current) use of opiate analgesic: Secondary | ICD-10-CM | POA: Diagnosis not present

## 2018-10-07 DIAGNOSIS — E039 Hypothyroidism, unspecified: Secondary | ICD-10-CM | POA: Diagnosis not present

## 2018-10-07 DIAGNOSIS — S22000D Wedge compression fracture of unspecified thoracic vertebra, subsequent encounter for fracture with routine healing: Secondary | ICD-10-CM | POA: Diagnosis not present

## 2018-10-07 DIAGNOSIS — I5032 Chronic diastolic (congestive) heart failure: Secondary | ICD-10-CM | POA: Diagnosis not present

## 2018-10-07 DIAGNOSIS — S22088D Other fracture of T11-T12 vertebra, subsequent encounter for fracture with routine healing: Secondary | ICD-10-CM | POA: Diagnosis not present

## 2018-10-07 DIAGNOSIS — F329 Major depressive disorder, single episode, unspecified: Secondary | ICD-10-CM | POA: Diagnosis not present

## 2018-10-07 DIAGNOSIS — N183 Chronic kidney disease, stage 3 (moderate): Secondary | ICD-10-CM | POA: Diagnosis not present

## 2018-10-07 DIAGNOSIS — Z79891 Long term (current) use of opiate analgesic: Secondary | ICD-10-CM | POA: Diagnosis not present

## 2018-10-07 DIAGNOSIS — Z5181 Encounter for therapeutic drug level monitoring: Secondary | ICD-10-CM | POA: Diagnosis not present

## 2018-10-07 DIAGNOSIS — K5909 Other constipation: Secondary | ICD-10-CM | POA: Diagnosis not present

## 2018-10-07 DIAGNOSIS — I110 Hypertensive heart disease with heart failure: Secondary | ICD-10-CM | POA: Diagnosis not present

## 2018-10-07 DIAGNOSIS — F419 Anxiety disorder, unspecified: Secondary | ICD-10-CM | POA: Diagnosis not present

## 2018-10-07 DIAGNOSIS — Z8673 Personal history of transient ischemic attack (TIA), and cerebral infarction without residual deficits: Secondary | ICD-10-CM | POA: Diagnosis not present

## 2018-10-07 DIAGNOSIS — Z7901 Long term (current) use of anticoagulants: Secondary | ICD-10-CM | POA: Diagnosis not present

## 2018-10-07 DIAGNOSIS — G8929 Other chronic pain: Secondary | ICD-10-CM | POA: Diagnosis not present

## 2018-10-08 DIAGNOSIS — J42 Unspecified chronic bronchitis: Secondary | ICD-10-CM | POA: Diagnosis not present

## 2018-10-09 ENCOUNTER — Other Ambulatory Visit: Payer: Self-pay | Admitting: Adult Health

## 2018-10-12 ENCOUNTER — Other Ambulatory Visit: Payer: Self-pay | Admitting: Internal Medicine

## 2018-10-12 DIAGNOSIS — M545 Low back pain: Secondary | ICD-10-CM | POA: Diagnosis not present

## 2018-10-12 DIAGNOSIS — R911 Solitary pulmonary nodule: Secondary | ICD-10-CM | POA: Diagnosis not present

## 2018-10-12 DIAGNOSIS — Z8673 Personal history of transient ischemic attack (TIA), and cerebral infarction without residual deficits: Secondary | ICD-10-CM | POA: Diagnosis not present

## 2018-10-12 DIAGNOSIS — I340 Nonrheumatic mitral (valve) insufficiency: Secondary | ICD-10-CM | POA: Diagnosis not present

## 2018-10-12 DIAGNOSIS — Z780 Asymptomatic menopausal state: Secondary | ICD-10-CM

## 2018-10-12 DIAGNOSIS — S22088D Other fracture of T11-T12 vertebra, subsequent encounter for fracture with routine healing: Secondary | ICD-10-CM | POA: Diagnosis not present

## 2018-10-12 DIAGNOSIS — F331 Major depressive disorder, recurrent, moderate: Secondary | ICD-10-CM | POA: Diagnosis not present

## 2018-10-12 DIAGNOSIS — R0602 Shortness of breath: Secondary | ICD-10-CM

## 2018-10-12 DIAGNOSIS — R112 Nausea with vomiting, unspecified: Secondary | ICD-10-CM

## 2018-10-12 DIAGNOSIS — G8929 Other chronic pain: Secondary | ICD-10-CM

## 2018-11-08 DIAGNOSIS — J42 Unspecified chronic bronchitis: Secondary | ICD-10-CM | POA: Diagnosis not present

## 2018-11-09 DIAGNOSIS — F331 Major depressive disorder, recurrent, moderate: Secondary | ICD-10-CM | POA: Diagnosis not present

## 2018-11-09 DIAGNOSIS — Z7901 Long term (current) use of anticoagulants: Secondary | ICD-10-CM | POA: Diagnosis not present

## 2018-11-09 DIAGNOSIS — S22088D Other fracture of T11-T12 vertebra, subsequent encounter for fracture with routine healing: Secondary | ICD-10-CM | POA: Diagnosis not present

## 2018-11-09 DIAGNOSIS — I340 Nonrheumatic mitral (valve) insufficiency: Secondary | ICD-10-CM | POA: Diagnosis not present

## 2018-11-09 DIAGNOSIS — Z8673 Personal history of transient ischemic attack (TIA), and cerebral infarction without residual deficits: Secondary | ICD-10-CM | POA: Diagnosis not present

## 2018-11-09 DIAGNOSIS — M545 Low back pain: Secondary | ICD-10-CM | POA: Diagnosis not present

## 2018-11-23 DIAGNOSIS — S22088D Other fracture of T11-T12 vertebra, subsequent encounter for fracture with routine healing: Secondary | ICD-10-CM | POA: Diagnosis not present

## 2018-11-23 DIAGNOSIS — G47419 Narcolepsy without cataplexy: Secondary | ICD-10-CM | POA: Diagnosis not present

## 2018-11-23 DIAGNOSIS — F331 Major depressive disorder, recurrent, moderate: Secondary | ICD-10-CM | POA: Diagnosis not present

## 2018-11-23 DIAGNOSIS — Z8673 Personal history of transient ischemic attack (TIA), and cerebral infarction without residual deficits: Secondary | ICD-10-CM | POA: Diagnosis not present

## 2018-11-23 DIAGNOSIS — Z7901 Long term (current) use of anticoagulants: Secondary | ICD-10-CM | POA: Diagnosis not present

## 2018-11-23 DIAGNOSIS — M545 Low back pain: Secondary | ICD-10-CM | POA: Diagnosis not present

## 2018-11-23 DIAGNOSIS — I340 Nonrheumatic mitral (valve) insufficiency: Secondary | ICD-10-CM | POA: Diagnosis not present

## 2018-11-24 ENCOUNTER — Other Ambulatory Visit: Payer: Self-pay | Admitting: Internal Medicine

## 2018-11-24 DIAGNOSIS — R911 Solitary pulmonary nodule: Secondary | ICD-10-CM

## 2018-11-24 DIAGNOSIS — N200 Calculus of kidney: Secondary | ICD-10-CM

## 2018-11-25 ENCOUNTER — Other Ambulatory Visit (HOSPITAL_COMMUNITY): Payer: Self-pay | Admitting: Internal Medicine

## 2018-11-25 DIAGNOSIS — R911 Solitary pulmonary nodule: Secondary | ICD-10-CM

## 2018-11-25 DIAGNOSIS — N200 Calculus of kidney: Secondary | ICD-10-CM

## 2018-11-26 DIAGNOSIS — Z4789 Encounter for other orthopedic aftercare: Secondary | ICD-10-CM | POA: Diagnosis not present

## 2018-12-08 DIAGNOSIS — J42 Unspecified chronic bronchitis: Secondary | ICD-10-CM | POA: Diagnosis not present

## 2018-12-09 ENCOUNTER — Other Ambulatory Visit: Payer: Self-pay

## 2018-12-09 ENCOUNTER — Ambulatory Visit (HOSPITAL_COMMUNITY)
Admission: RE | Admit: 2018-12-09 | Discharge: 2018-12-09 | Disposition: A | Discharge status: Home / Self Care | Payer: PPO | Source: Ambulatory Visit | Attending: Internal Medicine | Admitting: Internal Medicine

## 2018-12-09 DIAGNOSIS — Z780 Asymptomatic menopausal state: Secondary | ICD-10-CM | POA: Diagnosis not present

## 2018-12-09 DIAGNOSIS — M8589 Other specified disorders of bone density and structure, multiple sites: Secondary | ICD-10-CM | POA: Diagnosis not present

## 2018-12-14 DIAGNOSIS — R55 Syncope and collapse: Secondary | ICD-10-CM | POA: Diagnosis not present

## 2018-12-14 DIAGNOSIS — M8580 Other specified disorders of bone density and structure, unspecified site: Secondary | ICD-10-CM | POA: Diagnosis not present

## 2018-12-14 DIAGNOSIS — E039 Hypothyroidism, unspecified: Secondary | ICD-10-CM | POA: Diagnosis not present

## 2018-12-14 DIAGNOSIS — R51 Headache: Secondary | ICD-10-CM | POA: Diagnosis not present

## 2018-12-14 DIAGNOSIS — Z7901 Long term (current) use of anticoagulants: Secondary | ICD-10-CM | POA: Diagnosis not present

## 2018-12-14 DIAGNOSIS — I340 Nonrheumatic mitral (valve) insufficiency: Secondary | ICD-10-CM | POA: Diagnosis not present

## 2018-12-14 DIAGNOSIS — G47419 Narcolepsy without cataplexy: Secondary | ICD-10-CM | POA: Diagnosis not present

## 2018-12-14 DIAGNOSIS — K5900 Constipation, unspecified: Secondary | ICD-10-CM | POA: Diagnosis not present

## 2018-12-14 DIAGNOSIS — S22088D Other fracture of T11-T12 vertebra, subsequent encounter for fracture with routine healing: Secondary | ICD-10-CM | POA: Diagnosis not present

## 2018-12-14 DIAGNOSIS — M79601 Pain in right arm: Secondary | ICD-10-CM | POA: Diagnosis not present

## 2018-12-14 DIAGNOSIS — Z8673 Personal history of transient ischemic attack (TIA), and cerebral infarction without residual deficits: Secondary | ICD-10-CM | POA: Diagnosis not present

## 2018-12-14 DIAGNOSIS — M542 Cervicalgia: Secondary | ICD-10-CM | POA: Diagnosis not present

## 2018-12-14 DIAGNOSIS — R079 Chest pain, unspecified: Secondary | ICD-10-CM | POA: Diagnosis not present

## 2018-12-14 DIAGNOSIS — M545 Low back pain: Secondary | ICD-10-CM | POA: Diagnosis not present

## 2018-12-14 DIAGNOSIS — R10811 Right upper quadrant abdominal tenderness: Secondary | ICD-10-CM | POA: Diagnosis not present

## 2018-12-14 DIAGNOSIS — F331 Major depressive disorder, recurrent, moderate: Secondary | ICD-10-CM | POA: Diagnosis not present

## 2018-12-14 DIAGNOSIS — Z86718 Personal history of other venous thrombosis and embolism: Secondary | ICD-10-CM | POA: Diagnosis not present

## 2018-12-14 DIAGNOSIS — M79602 Pain in left arm: Secondary | ICD-10-CM | POA: Diagnosis not present

## 2018-12-15 DIAGNOSIS — M5136 Other intervertebral disc degeneration, lumbar region: Secondary | ICD-10-CM | POA: Insufficient documentation

## 2018-12-17 ENCOUNTER — Observation Stay (HOSPITAL_COMMUNITY)
Admission: EM | Admit: 2018-12-17 | Discharge: 2018-12-18 | Disposition: A | Discharge status: Home / Self Care | Payer: PPO | Attending: Family Medicine | Admitting: Family Medicine

## 2018-12-17 ENCOUNTER — Other Ambulatory Visit (HOSPITAL_COMMUNITY): Payer: Self-pay | Admitting: *Deleted

## 2018-12-17 ENCOUNTER — Observation Stay (HOSPITAL_COMMUNITY)
Admission: RE | Admit: 2018-12-17 | Discharge: 2018-12-17 | Disposition: A | Discharge status: Home / Self Care | Payer: PPO | Source: Ambulatory Visit | Attending: Family Medicine | Admitting: Family Medicine

## 2018-12-17 ENCOUNTER — Observation Stay (HOSPITAL_COMMUNITY): Payer: PPO

## 2018-12-17 ENCOUNTER — Emergency Department (HOSPITAL_COMMUNITY): Payer: PPO

## 2018-12-17 ENCOUNTER — Other Ambulatory Visit: Payer: Self-pay

## 2018-12-17 ENCOUNTER — Encounter (HOSPITAL_COMMUNITY): Payer: Self-pay

## 2018-12-17 DIAGNOSIS — M546 Pain in thoracic spine: Secondary | ICD-10-CM

## 2018-12-17 DIAGNOSIS — R55 Syncope and collapse: Secondary | ICD-10-CM

## 2018-12-17 DIAGNOSIS — Z03818 Encounter for observation for suspected exposure to other biological agents ruled out: Secondary | ICD-10-CM | POA: Insufficient documentation

## 2018-12-17 DIAGNOSIS — I110 Hypertensive heart disease with heart failure: Secondary | ICD-10-CM | POA: Diagnosis not present

## 2018-12-17 DIAGNOSIS — R4182 Altered mental status, unspecified: Secondary | ICD-10-CM | POA: Diagnosis not present

## 2018-12-17 DIAGNOSIS — R202 Paresthesia of skin: Secondary | ICD-10-CM | POA: Insufficient documentation

## 2018-12-17 DIAGNOSIS — J45909 Unspecified asthma, uncomplicated: Secondary | ICD-10-CM | POA: Insufficient documentation

## 2018-12-17 DIAGNOSIS — E039 Hypothyroidism, unspecified: Secondary | ICD-10-CM | POA: Diagnosis not present

## 2018-12-17 DIAGNOSIS — F339 Major depressive disorder, recurrent, unspecified: Secondary | ICD-10-CM | POA: Diagnosis present

## 2018-12-17 DIAGNOSIS — G8929 Other chronic pain: Secondary | ICD-10-CM | POA: Insufficient documentation

## 2018-12-17 DIAGNOSIS — Z853 Personal history of malignant neoplasm of breast: Secondary | ICD-10-CM | POA: Insufficient documentation

## 2018-12-17 DIAGNOSIS — Z8673 Personal history of transient ischemic attack (TIA), and cerebral infarction without residual deficits: Secondary | ICD-10-CM | POA: Insufficient documentation

## 2018-12-17 DIAGNOSIS — I10 Essential (primary) hypertension: Secondary | ICD-10-CM | POA: Diagnosis present

## 2018-12-17 DIAGNOSIS — R079 Chest pain, unspecified: Principal | ICD-10-CM | POA: Insufficient documentation

## 2018-12-17 DIAGNOSIS — Z79899 Other long term (current) drug therapy: Secondary | ICD-10-CM | POA: Diagnosis not present

## 2018-12-17 DIAGNOSIS — Z7901 Long term (current) use of anticoagulants: Secondary | ICD-10-CM | POA: Insufficient documentation

## 2018-12-17 DIAGNOSIS — R569 Unspecified convulsions: Secondary | ICD-10-CM | POA: Diagnosis not present

## 2018-12-17 DIAGNOSIS — I6523 Occlusion and stenosis of bilateral carotid arteries: Secondary | ICD-10-CM | POA: Diagnosis not present

## 2018-12-17 DIAGNOSIS — R0789 Other chest pain: Secondary | ICD-10-CM | POA: Diagnosis not present

## 2018-12-17 DIAGNOSIS — F329 Major depressive disorder, single episode, unspecified: Secondary | ICD-10-CM | POA: Diagnosis not present

## 2018-12-17 DIAGNOSIS — I5032 Chronic diastolic (congestive) heart failure: Secondary | ICD-10-CM | POA: Diagnosis not present

## 2018-12-17 DIAGNOSIS — G9389 Other specified disorders of brain: Secondary | ICD-10-CM | POA: Diagnosis not present

## 2018-12-17 LAB — LIPID PANEL
Cholesterol: 220 mg/dL — ABNORMAL HIGH (ref 0–200)
HDL: 60 mg/dL (ref >40)
LDL Cholesterol: 131 mg/dL — ABNORMAL HIGH (ref 0–99)
Total CHOL/HDL Ratio: 3.7 RATIO
Triglycerides: 146 mg/dL (ref <150)
VLDL: 29 mg/dL (ref 0–40)

## 2018-12-17 LAB — CBC WITH DIFFERENTIAL/PLATELET
Abs Immature Granulocytes: 0.01 10*3/uL (ref 0.00–0.07)
Basophils Absolute: 0.1 10*3/uL (ref 0.0–0.1)
Basophils Relative: 1 %
Eosinophils Absolute: 0.3 10*3/uL (ref 0.0–0.5)
Eosinophils Relative: 4 %
HCT: 43.6 % (ref 36.0–46.0)
Hemoglobin: 14 g/dL (ref 12.0–15.0)
Immature Granulocytes: 0 %
Lymphocytes Relative: 34 %
Lymphs Abs: 2.2 10*3/uL (ref 0.7–4.0)
MCH: 29.1 pg (ref 26.0–34.0)
MCHC: 32.1 g/dL (ref 30.0–36.0)
MCV: 90.6 fL (ref 80.0–100.0)
Monocytes Absolute: 0.7 10*3/uL (ref 0.1–1.0)
Monocytes Relative: 10 %
Neutro Abs: 3.3 10*3/uL (ref 1.7–7.7)
Neutrophils Relative %: 51 %
Platelets: 297 10*3/uL (ref 150–400)
RBC: 4.81 MIL/uL (ref 3.87–5.11)
RDW: 14.4 % (ref 11.5–15.5)
WBC: 6.5 10*3/uL (ref 4.0–10.5)
nRBC: 0 % (ref 0.0–0.2)

## 2018-12-17 LAB — BASIC METABOLIC PANEL
Anion gap: 10 (ref 5–15)
BUN: 20 mg/dL (ref 8–23)
CO2: 28 mmol/L (ref 22–32)
Calcium: 9.3 mg/dL (ref 8.9–10.3)
Chloride: 103 mmol/L (ref 98–111)
Creatinine, Ser: 1.21 mg/dL — ABNORMAL HIGH (ref 0.44–1.00)
GFR calc Af Amer: 49 mL/min — ABNORMAL LOW (ref >60)
GFR calc non Af Amer: 42 mL/min — ABNORMAL LOW (ref >60)
Glucose, Bld: 102 mg/dL — ABNORMAL HIGH (ref 70–99)
Potassium: 3 mmol/L — ABNORMAL LOW (ref 3.5–5.1)
Sodium: 141 mmol/L (ref 135–145)

## 2018-12-17 LAB — CBC
HCT: 40.7 % (ref 36.0–46.0)
Hemoglobin: 12.9 g/dL (ref 12.0–15.0)
MCH: 29.3 pg (ref 26.0–34.0)
MCHC: 31.7 g/dL (ref 30.0–36.0)
MCV: 92.3 fL (ref 80.0–100.0)
Platelets: 271 10*3/uL (ref 150–400)
RBC: 4.41 MIL/uL (ref 3.87–5.11)
RDW: 14.1 % (ref 11.5–15.5)
WBC: 6.4 10*3/uL (ref 4.0–10.5)
nRBC: 0 % (ref 0.0–0.2)

## 2018-12-17 LAB — MAGNESIUM: Magnesium: 2.5 mg/dL — ABNORMAL HIGH (ref 1.7–2.4)

## 2018-12-17 LAB — CREATININE, SERUM
Creatinine, Ser: 1.1 mg/dL — ABNORMAL HIGH (ref 0.44–1.00)
GFR calc Af Amer: 55 mL/min — ABNORMAL LOW (ref >60)
GFR calc non Af Amer: 47 mL/min — ABNORMAL LOW (ref >60)

## 2018-12-17 LAB — TROPONIN I (HIGH SENSITIVITY)
Troponin I (High Sensitivity): 10 ng/L (ref <18)
Troponin I (High Sensitivity): 17 ng/L (ref <18)
Troponin I (High Sensitivity): 35 ng/L — ABNORMAL HIGH (ref <18)
Troponin I (High Sensitivity): 26 ng/L — ABNORMAL HIGH (ref <18)

## 2018-12-17 LAB — PROTIME-INR
INR: 1.6 — ABNORMAL HIGH (ref 0.8–1.2)
Prothrombin Time: 19.2 seconds — ABNORMAL HIGH (ref 11.4–15.2)

## 2018-12-17 LAB — SARS CORONAVIRUS 2 BY RT PCR (HOSPITAL ORDER, PERFORMED IN ~~LOC~~ HOSPITAL LAB): SARS Coronavirus 2: NEGATIVE

## 2018-12-17 MED ORDER — WARFARIN SODIUM 5 MG PO TABS
5.0000 mg | ORAL_TABLET | Freq: Once | ORAL | Status: AC
  Administered 2018-12-17: 5 mg via ORAL
  Filled 2018-12-17: qty 1

## 2018-12-17 MED ORDER — ACETAMINOPHEN 325 MG PO TABS
650.0000 mg | ORAL_TABLET | ORAL | Status: DC | PRN
  Administered 2018-12-17: 650 mg via ORAL
  Filled 2018-12-17 (×2): qty 2

## 2018-12-17 MED ORDER — ALBUTEROL SULFATE (2.5 MG/3ML) 0.083% IN NEBU: 2.5000 mg | INHALATION_SOLUTION | Freq: Four times a day (QID) | RESPIRATORY_TRACT | Status: DC | PRN

## 2018-12-17 MED ORDER — BUPROPION HCL ER (XL) 150 MG PO TB24
150.0000 mg | ORAL_TABLET | Freq: Every day | ORAL | Status: DC
  Administered 2018-12-18: 150 mg via ORAL
  Filled 2018-12-17: qty 1

## 2018-12-17 MED ORDER — WARFARIN - PHARMACIST DOSING INPATIENT
Freq: Every day | Status: DC
  Administered 2018-12-17: 18:00:00

## 2018-12-17 MED ORDER — ALBUTEROL SULFATE HFA 108 (90 BASE) MCG/ACT IN AERS: 1.0000 | INHALATION_SPRAY | Freq: Four times a day (QID) | RESPIRATORY_TRACT | Status: DC | PRN

## 2018-12-17 MED ORDER — POLYETHYLENE GLYCOL 3350 17 G PO PACK: 17.0000 g | PACK | Freq: Every day | ORAL | Status: DC | PRN

## 2018-12-17 MED ORDER — POTASSIUM CHLORIDE CRYS ER 20 MEQ PO TBCR
30.0000 meq | EXTENDED_RELEASE_TABLET | Freq: Two times a day (BID) | ORAL | Status: DC
  Administered 2018-12-17: 30 meq via ORAL
  Filled 2018-12-17: qty 1

## 2018-12-17 MED ORDER — MORPHINE SULFATE (PF) 2 MG/ML IV SOLN
0.5000 mg | INTRAVENOUS | Status: DC | PRN
  Administered 2018-12-17 – 2018-12-18 (×2): 0.5 mg via INTRAVENOUS
  Filled 2018-12-17 (×2): qty 1

## 2018-12-17 MED ORDER — PANTOPRAZOLE SODIUM 40 MG PO TBEC
40.0000 mg | DELAYED_RELEASE_TABLET | Freq: Every day | ORAL | Status: DC
  Administered 2018-12-17 – 2018-12-18 (×2): 40 mg via ORAL
  Filled 2018-12-17 (×2): qty 1

## 2018-12-17 MED ORDER — FUROSEMIDE 40 MG PO TABS: 40.0000 mg | ORAL_TABLET | ORAL | Status: DC

## 2018-12-17 MED ORDER — POTASSIUM CHLORIDE IN NACL 20-0.9 MEQ/L-% IV SOLN
INTRAVENOUS | Status: DC
  Administered 2018-12-17: 18:00:00 via INTRAVENOUS

## 2018-12-17 MED ORDER — EZETIMIBE 10 MG PO TABS
10.0000 mg | ORAL_TABLET | Freq: Every day | ORAL | Status: DC
  Administered 2018-12-17 – 2018-12-18 (×2): 10 mg via ORAL
  Filled 2018-12-17 (×2): qty 1

## 2018-12-17 MED ORDER — ONDANSETRON HCL 4 MG/2ML IJ SOLN: 4.0000 mg | Freq: Four times a day (QID) | INTRAMUSCULAR | Status: DC | PRN

## 2018-12-17 MED ORDER — ASPIRIN EC 81 MG PO TBEC: 81.0000 mg | DELAYED_RELEASE_TABLET | Freq: Every day | ORAL | Status: DC

## 2018-12-17 MED ORDER — LEVOTHYROXINE SODIUM 50 MCG PO TABS
50.0000 ug | ORAL_TABLET | Freq: Every day | ORAL | Status: DC
  Administered 2018-12-18: 50 ug via ORAL
  Filled 2018-12-17: qty 1

## 2018-12-17 MED ORDER — FERROUS GLUCONATE 324 (38 FE) MG PO TABS
324.0000 mg | ORAL_TABLET | Freq: Every day | ORAL | Status: DC
  Administered 2018-12-18: 324 mg via ORAL
  Filled 2018-12-17 (×3): qty 1

## 2018-12-17 MED ORDER — POTASSIUM CHLORIDE CRYS ER 10 MEQ PO TBCR: 10.0000 meq | EXTENDED_RELEASE_TABLET | Freq: Two times a day (BID) | ORAL | Status: DC

## 2018-12-17 MED ORDER — METOPROLOL TARTRATE 25 MG PO TABS
25.0000 mg | ORAL_TABLET | Freq: Two times a day (BID) | ORAL | Status: DC
  Administered 2018-12-17 – 2018-12-18 (×3): 25 mg via ORAL
  Filled 2018-12-17 (×3): qty 1

## 2018-12-17 MED ORDER — HEPARIN SODIUM (PORCINE) 5000 UNIT/ML IJ SOLN
5000.0000 [IU] | Freq: Three times a day (TID) | INTRAMUSCULAR | Status: DC
  Administered 2018-12-17 – 2018-12-18 (×2): 5000 [IU] via SUBCUTANEOUS
  Filled 2018-12-17 (×2): qty 1

## 2018-12-17 MED ORDER — NITROGLYCERIN 0.4 MG SL SUBL: 0.4000 mg | SUBLINGUAL_TABLET | SUBLINGUAL | Status: DC | PRN

## 2018-12-17 MED ORDER — LEVETIRACETAM 250 MG PO TABS
250.0000 mg | ORAL_TABLET | Freq: Two times a day (BID) | ORAL | Status: DC
  Administered 2018-12-17 – 2018-12-18 (×2): 250 mg via ORAL
  Filled 2018-12-17 (×2): qty 1

## 2018-12-17 MED ORDER — AMLODIPINE BESYLATE 5 MG PO TABS
5.0000 mg | ORAL_TABLET | Freq: Every day | ORAL | Status: DC
  Administered 2018-12-17 – 2018-12-18 (×2): 5 mg via ORAL
  Filled 2018-12-17 (×2): qty 1

## 2018-12-17 MED ORDER — VITAMIN B-12 100 MCG PO TABS
100.0000 ug | ORAL_TABLET | Freq: Every day | ORAL | Status: DC
  Administered 2018-12-18: 100 ug via ORAL
  Filled 2018-12-17: qty 1

## 2018-12-17 MED ORDER — CALCIUM CARBONATE ANTACID 500 MG PO CHEW
1.0000 | CHEWABLE_TABLET | Freq: Every day | ORAL | Status: DC
  Administered 2018-12-18: 200 mg via ORAL
  Filled 2018-12-17: qty 1

## 2018-12-17 NOTE — Progress Notes (Signed)
ANTICOAGULATION CONSULT NOTE - Initial Consult  Pharmacy Consult for Coumadin Indication: atrial fibrillation  Allergies  Allergen Reactions  . Epinephrine Anaphylaxis  . Demerol  [Meperidine Hcl]   . Demerol [Meperidine] Other (See Comments)    headaches  . Vicodin [Hydrocodone-Acetaminophen] Other (See Comments)    [name] it makes her crazy. Pt tolerates acetaminophen    Patient Measurements: Height: 5' (152.4 cm) Weight: 130 lb 8.2 oz (59.2 kg) IBW/kg (Calculated) : 45.5  Vital Signs: Temp: 98.5 F (36.9 C) (07/24 1208) BP: 121/57 (07/24 1208) Pulse Rate: 63 (07/24 1208)  Labs: Recent Labs    12/17/18 0240 12/17/18 0420 12/17/18 1356 12/17/18 1411  HGB 14.0  --  12.9  --   HCT 43.6  --  40.7  --   PLT 297  --  271  --   LABPROT  --   --   --  19.2*  INR  --   --   --  1.6*  CREATININE 1.21*  --   --   --   TROPONINIHS 10 17  --   --     Estimated Creatinine Clearance: 29.4 mL/min (A) (by C-G formula based on SCr of 1.21 mg/dL (H)).   Medical History: Past Medical History:  Diagnosis Date  . Adenomatous colon polyp 2008  . Asthma   . Complication of anesthesia   . CVA (cerebral vascular accident) ([place])    sl weaker on rt foot  . Diverticulosis   . GERD (gastroesophageal reflux disease)   . Hemorrhoids 2007  . HTN (hypertension)   . Hyperplastic colon polyp 2005  . Hypothyroidism   . Narcolepsy   . PONV (postoperative nausea and vomiting)   . TIA (transient ischemic attack)     Medications:  Medications Prior to Admission  Medication Sig Dispense Refill Last Dose  . albuterol (PROVENTIL) (2.5 MG/3ML) 0.083% nebulizer solution Take 3 mLs (2.5 mg total) by nebulization every 6 (six) hours as needed for wheezing or shortness of breath. 75 mL 0   . albuterol (VENTOLIN HFA) 108 (90 Base) MCG/ACT inhaler Inhale 1-2 puffs into the lungs every 6 (six) hours as needed for wheezing or shortness of breath.     Marland Kitchen amLODipine (NORVASC) 5 MG tablet Take 1 tablet  (5 mg total) by mouth daily. 30 tablet 0 12/16/2018 at Unknown time  . Ascorbic Acid (VITAMIN C) 1000 MG tablet Take 1,000 mg by mouth daily.   12/16/2018 at Unknown time  . B Complex Vitamins (VITAMIN B COMPLEX) TABS Take 1 tablet by mouth daily.   12/16/2018 at Unknown time  . Biotin 300 MCG TABS Take 1 tablet by mouth daily after supper.    12/16/2018 at Unknown time  . buPROPion (WELLBUTRIN XL) 150 MG 24 hr tablet Take 1 tablet (150 mg total) by mouth daily. 30 tablet 0 12/16/2018 at Unknown time  . Calcium Carbonate 500 MG CHEW Chew 1 tablet by mouth daily.   12/16/2018 at Unknown time  . cholecalciferol (VITAMIN D) 1000 UNITS tablet Take 1,000 Units by mouth daily.   12/16/2018 at Unknown time  . conjugated estrogens (PREMARIN) vaginal cream Apply 1 application topically daily as needed.     . cyanocobalamin 100 MCG tablet Take 1 tablet by mouth daily.    12/16/2018 at Unknown time  . ezetimibe (ZETIA) 10 MG tablet Take 1 tablet (10 mg total) by mouth daily. 30 tablet 0 12/16/2018 at Unknown time  . Ferrous Gluconate (IRON) 240 (27 Fe) MG TABS Take  1 tablet by mouth daily. 30 tablet 0 12/16/2018 at Unknown time  . Flaxseed, Linseed, (FLAXSEED OIL) 1000 MG CAPS Take 1 capsule by mouth.    12/16/2018 at Unknown time  . furosemide (LASIX) 40 MG tablet Take 1 tablet (40 mg total) by mouth every morning. 30 tablet 0 12/16/2018 at Unknown time  . Lecithin 1200 MG CAPS Take 1 capsule by mouth daily.   12/16/2018 at Unknown time  . levothyroxine (SYNTHROID, LEVOTHROID) 50 MCG tablet Take 1 tablet (50 mcg total) by mouth daily. 30 tablet 0 12/16/2018 at Unknown time  . Lutein 6 MG CAPS Take 1 capsule by mouth daily.   12/16/2018 at Unknown time  . Magnesium 250 MG TABS Take 1 tablet by mouth daily.   12/16/2018 at Unknown time  . metoprolol tartrate (LOPRESSOR) 25 MG tablet Take 1 tablet (25 mg total) by mouth 2 (two) times daily. Take with food / meals 30 tablet 0 12/16/2018 at 1600  . NON FORMULARY Diet Type:  NAS      . Omega-3 Fatty Acids (FISH OIL) 1200 MG CAPS Take 1,200 mg by mouth daily.   12/16/2018 at Unknown time  . pantoprazole (PROTONIX) 40 MG tablet Take 1 tablet (40 mg total) by mouth daily as needed. (Patient taking differently: Take 40 mg by mouth daily. ) 30 tablet 0 12/16/2018 at Unknown time  . polyethylene glycol (MIRALAX / GLYCOLAX) packet Take 17 g by mouth daily. (Patient taking differently: Take 17 g by mouth daily as needed. ) 14 each 0 12/16/2018 at Unknown time  . potassium chloride (K-DUR) 10 MEQ tablet Take 1 tablet (10 mEq total) by mouth 2 (two) times daily. 60 tablet 0 12/16/2018 at Unknown time  . Turmeric 500 MG TABS Take by mouth daily.   12/16/2018 at Unknown time  . warfarin (COUMADIN) 4 MG tablet Take 1 tablet (4 mg total) by mouth daily. 30 tablet 0 12/16/2018 at 0700  . Zinc 50 MG TABS Take 1 tablet by mouth daily.   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 INR is subtherapeutic at 1.6. Pharmacy asked to dose. Home dose is 4mg  daily  Goal of Therapy:  INR 2-3 Monitor platelets by anticoagulation protocol: Yes   Plan:  Coumadin 5mg  po x 1 today PT-INR daily Monitor for S/S of bleeding  Isac Sarna, BS [place], BCPS Clinical Pharmacist Pager [phone] 12/17/2018,2:39 PM

## 2018-12-17 NOTE — ED Notes (Signed)
Patient transported to X-ray 

## 2018-12-17 NOTE — ED Triage Notes (Signed)
Pt arrives from home via POV c/o chest heaviness, which led to numbness and weakness in bilateral upper extremities. Pt reports some chest discomfort has gone away, but this has become a recurrent problem she is having.

## 2018-12-17 NOTE — ED Provider Notes (Signed)
[facility] EMERGENCY DEPARTMENT Provider Note   CSN: [id number] Arrival date & time: 12/17/18  0217    History   Chief Complaint Chief Complaint  Patient presents with  . Numbness    HPI [name] is a 83 y.o. female.     Patient presents to the emergency department for evaluation of chest pain.  Patient reports that she was lying in her bed when she started to have pain in her back.  Patient had kyphoplasty surgery 3 months ago.  She now sleeps in a hospital bed.  She reports that she thought that the bed was poorly positioned, tried to adjusted up and down but it did not help the back pain.  She then started to feel heaviness across her center of her chest followed by tingling in both of her arms.  Symptoms are improved but not resolved at arrival to the ER.  No associated shortness of breath.  Denies any history of heart disease.     Past Medical History:  Diagnosis Date  . Adenomatous colon polyp 2008  . Asthma   . Complication of anesthesia   . CVA (cerebral vascular accident) ([place])    sl weaker on rt foot  . Diverticulosis   . GERD (gastroesophageal reflux disease)   . Hemorrhoids 2007  . HTN (hypertension)   . Hyperplastic colon polyp 2005  . Hypothyroidism   . Narcolepsy   . PONV (postoperative nausea and vomiting)   . TIA (transient ischemic attack)     Patient Active Problem List   Diagnosis Date Noted  . H/O: CVA (cerebrovascular accident) 08/25/2018  . Depression, recurrent ([place]) 08/25/2018  . T12 compression fracture, with delayed healing, subsequent encounter   . Low back pain 08/19/2018  . Constipation 08/19/2018  . Chronic diastolic CHF (congestive heart failure) ([place]) 08/19/2018  . Acquired hypothyroidism 08/19/2018  . Intractable pain 08/19/2018  . Essential hypertension 08/19/2018  . T12 compression fracture ([place]) 08/18/2018  . Breast cancer ([place]) 06/23/2016  . Change in bowel habits 01/11/2015  . Loss of weight 01/11/2015  .  Abdominal pain, chronic, right upper quadrant 01/11/2015  . Unspecified chronic bronchitis ([place]) 09/25/2013  . Other dysphagia 04/01/2013  . Cough 01/20/2013  . Thrush 10/28/2012  . Chest pain 09/26/2012  . H/O adenomatous polyp of colon 11/18/2011  . Chronic anticoagulation 11/18/2011    Past Surgical History:  Procedure Laterality Date  . ABDOMINAL HYSTERECTOMY     age 24, complicated by poor wound healing, followed by revision of scar and radiation for ?malignancy  . BACK SURGERY    . BREAST ENHANCEMENT SURGERY    . BREAST IMPLANT EXCHANGE Right 06/23/2016   Procedure: RIGHT BREAST IMPLANT REMOVAL AND REPLACEMENT;  Surgeon: Cristine Polio, MD;  Location: [facility];  Service: Plastics;  Laterality: Right;  . COLONOSCOPY  2005   2 hyperplastic polyps  . COLONOSCOPY  2007   Dr. Oneida Alar- hemorrhoids  . COLONOSCOPY  2008   Dr. Reed Pandy polyp- rare sigmoid diverticulosis, internal hemorrhoids  . COLONOSCOPY  12/01/2011   Procedure: COLONOSCOPY;  Surgeon: Danie Binder, MD;  [facility];  Service: Endoscopy;  Laterality: N/A;  12:30 PM  . ESOPHAGOGASTRODUODENOSCOPY (EGD) WITH ESOPHAGEAL DILATION N/A 04/15/2013   Procedure: ESOPHAGOGASTRODUODENOSCOPY (EGD) WITH ESOPHAGEAL DILATION;  Surgeon: Danie Binder, MD;  [facility];  Service: Endoscopy;  Laterality: N/A;  11:45-moved to 12:30 Darius Bump to notify pt  . EYE SURGERY  2010  . EYE SURGERY    .  IR KYPHO LUMBAR INC FX REDUCE BONE BX UNI/BIL CANNULATION INC/IMAGING  08/23/2018  . right thumb surgery       OB History    Gravida      Para      Term      Preterm      AB      Living  1     SAB      TAB      Ectopic      Multiple      Live Births               Home Medications    Prior to Admission medications   Medication Sig Start Date End Date Taking? Authorizing Provider  albuterol (PROVENTIL) (2.5 MG/3ML) 0.083% nebulizer solution Take 3 mLs (2.5 mg total)  by nebulization every 6 (six) hours as needed for wheezing or shortness of breath. 09/08/18   Gerlene Fee, NP  amLODipine (NORVASC) 5 MG tablet Take 1 tablet (5 mg total) by mouth daily. 09/08/18   Gerlene Fee, NP  Ascorbic Acid (VITAMIN C) 1000 MG tablet Take 1,000 mg by mouth daily.    [provider]  B Complex Vitamins (VITAMIN B COMPLEX) TABS Take 1 tablet by mouth daily.    [provider]  Biotin 300 MCG TABS Take 1 tablet by mouth daily after supper.     [provider]  buPROPion (WELLBUTRIN XL) 150 MG 24 hr tablet Take 1 tablet (150 mg total) by mouth daily. 09/08/18   Gerlene Fee, NP  Calcium Carbonate 500 MG CHEW Chew 1 tablet by mouth daily. 08/24/18   [provider]  cholecalciferol (VITAMIN D) 1000 UNITS tablet Take 1,000 Units by mouth daily.    [provider]  cyanocobalamin 100 MCG tablet Take 1 tablet by mouth daily.  08/24/18   [provider]  ezetimibe (ZETIA) 10 MG tablet Take 1 tablet (10 mg total) by mouth daily. 09/08/18   Gerlene Fee, NP  Ferrous Gluconate (IRON) 240 (27 Fe) MG TABS Take 1 tablet by mouth daily. 09/08/18   Gerlene Fee, NP  Flaxseed, Linseed, (FLAXSEED OIL) 1000 MG CAPS Take 1 capsule by mouth.     [provider]  furosemide (LASIX) 20 MG tablet Take 1 tablet (20 mg total) by mouth every evening. 09/08/18   Gerlene Fee, NP  furosemide (LASIX) 40 MG tablet Take 1 tablet (40 mg total) by mouth every morning. 09/08/18   Gerlene Fee, NP  gabapentin (NEURONTIN) 100 MG capsule Take 1 capsule (100 mg total) by mouth 3 (three) times daily. 09/08/18   Gerlene Fee, NP  Lecithin 1200 MG CAPS Take 1 capsule by mouth daily.    [provider]  levothyroxine (SYNTHROID, LEVOTHROID) 50 MCG tablet Take 1 tablet (50 mcg total) by mouth daily. 09/08/18   Gerlene Fee, NP  Lutein 6 MG CAPS Take 1 capsule by mouth daily. 08/24/18   [provider]  Magnesium 250  MG TABS Take 1 tablet by mouth daily.    [provider]  metoprolol tartrate (LOPRESSOR) 25 MG tablet Take 1 tablet (25 mg total) by mouth 2 (two) times daily. Take with food / meals 09/08/18   Gerlene Fee, NP  NON FORMULARY Diet Type:  NAS 08/24/18   [provider]  Omega-3 Fatty Acids (FISH OIL) 1200 MG CAPS Take 1,200 mg by mouth daily.    [provider]  oxyCODONE (OXY IR/ROXICODONE) 5 MG immediate release tablet Take 1 tablet (5 mg total) by mouth every 6 (six) hours as needed for severe pain. 09/08/18   Gerlene Fee, NP  pantoprazole (PROTONIX) 40 MG tablet Take 1 tablet (40 mg total) by mouth daily as needed. 09/08/18   Gerlene Fee, NP  polyethylene glycol (MIRALAX / GLYCOLAX) packet Take 17 g by mouth daily. 08/25/18   Orson Eva, MD  potassium chloride (K-DUR) 10 MEQ tablet Take 1 tablet (10 mEq total) by mouth 2 (two) times daily. 09/08/18   Gerlene Fee, NP  senna (SENOKOT) 8.6 MG TABS tablet Take 2 tablets (17.2 mg total) by mouth daily. 08/25/18   Orson Eva, MD  Turmeric 500 MG TABS Take by mouth daily.    [provider]  warfarin (COUMADIN) 4 MG tablet Take 1 tablet (4 mg total) by mouth daily. 09/08/18   Gerlene Fee, NP  Zinc 50 MG TABS Take 1 tablet by mouth daily.    [provider]    Family History Family History  Problem Relation Age of Onset  . Colon cancer Father        age 61  . Prostate cancer Father   . Pancreatic cancer Mother        age 67    Social History Social History   Tobacco Use  . Smoking status: Never Smoker  . Smokeless tobacco: Never Used  Substance Use Topics  . Alcohol use: No  . Drug use: No     Allergies   Epinephrine, Demerol [meperidine], and Vicodin [hydrocodone-acetaminophen]   Review of Systems Review of Systems  Cardiovascular: Positive for chest pain.  Musculoskeletal: Positive for back pain.  All other systems reviewed and are negative.    Physical Exam  Updated Vital Signs BP (!) 148/73   Pulse 62   Temp 98.1 F (36.7 C) (Oral)   Resp 18   Ht 5\' 1"  (1.549 m)   Wt 58.1 kg   SpO2 98%   BMI 24.19 kg/m   Physical Exam Vitals signs and nursing note reviewed.  Constitutional:      General: She is not in acute distress.    Appearance: Normal appearance. She is well-developed.  HENT:     Head: Normocephalic and atraumatic.     Right Ear: Hearing normal.     Left Ear: Hearing normal.     Nose: Nose normal.  Eyes:     Conjunctiva/sclera: Conjunctivae normal.     Pupils: Pupils are equal, round, and reactive to light.  Neck:     Musculoskeletal: Normal range of motion and neck supple.  Cardiovascular:     Rate and Rhythm: Regular rhythm.     Heart sounds: S1 normal and S2 normal. No murmur. No friction rub. No gallop.   Pulmonary:     Effort: Pulmonary effort is normal. No respiratory distress.     Breath sounds: Normal breath sounds.  Chest:     Chest wall: No tenderness.  Abdominal:     General: Bowel sounds are normal.     Palpations: Abdomen is soft.     Tenderness: There is no abdominal tenderness. There is no guarding or rebound. Negative signs include Murphy's sign and McBurney's sign.     Hernia: No hernia is present.  Musculoskeletal: Normal range of motion.  Skin:    General: Skin is warm and dry.     Findings: No rash.  Neurological:     Mental Status:  She is alert and oriented to person, place, and time.     GCS: GCS eye subscore is 4. GCS verbal subscore is 5. GCS motor subscore is 6.     Cranial Nerves: No cranial nerve deficit.     Sensory: No sensory deficit.     Coordination: Coordination normal.  Psychiatric:        Speech: Speech normal.        Behavior: Behavior normal.        Thought Content: Thought content normal.      ED Treatments / Results  Labs (all labs ordered are listed, but only abnormal results are displayed) Labs Reviewed  BASIC METABOLIC PANEL - Abnormal; Notable for the following  components:      Result Value   Potassium 3.0 (*)    Glucose, Bld 102 (*)    Creatinine, Ser 1.21 (*)    GFR calc non Af Amer 42 (*)    GFR calc Af Amer 49 (*)    All other components within normal limits  CBC WITH DIFFERENTIAL/PLATELET  TROPONIN I (HIGH SENSITIVITY)  TROPONIN I (HIGH SENSITIVITY)    EKG EKG Interpretation  Date/Time:  Friday December 17 2018 02:32:59 EDT Ventricular Rate:  79 PR Interval:    QRS Duration: 92 QT Interval:  410 QTC Calculation: 470 R Axis:   -3 Text Interpretation:  Sinus rhythm Low voltage, precordial leads Confirmed by Orpah Greek [phone]) on 12/17/2018 2:35:12 AM   Radiology Dg Chest 2 View  Result Date: 12/17/2018 CLINICAL DATA:  Chest pain. Weakness. EXAM: CHEST - 2 VIEW COMPARISON:  06/27/2013 FINDINGS: The cardiomediastinal contours are normal. Aortic atherosclerosis. The lungs are clear. Pulmonary vasculature is normal. No consolidation, pleural effusion, or pneumothorax. No acute osseous abnormalities are seen. Chronic T12 compression fracture with vertebroplasty. Peripherally calcified breast implants. IMPRESSION: No acute chest findings. Electronically Signed   By: Keith Rake M.D.   On: 12/17/2018 03:01    Procedures Procedures (including critical care time)  Medications Ordered in ED Medications - No data to display   Initial Impression / Assessment and Plan / ED Course  I have reviewed the triage vital signs and the nursing notes.  Pertinent labs & imaging results that were available during my care of the patient were reviewed by me and considered in my medical decision making (see chart for details).        Patient presented to the ER for evaluation of chest pain.  Patient first had onset of pain in the upper thoracic area that was then followed by chest pain and tingling in the arms.  Patient had kyphoplasty surgery several months ago.  She has been having ongoing issues with back pain ever since.  She does not  like to take the oxycodone prescribed to her, only takes Tylenol for her pain.  She did not take anything tonight.  She does not have any known coronary disease.  She did have a heaviness in the center of her chest that lasted for about an hour or less after the onset of the back pain.  No associated shortness of breath.  Chest pain has resolved without intervention, still having back pain.  Patient refusing any pain medication here in the ER.  EKG is unremarkable.  First troponin was 10.  Recommended hospitalization to the patient for observation.  She had a heart score of 5.  Current algorithm for her troponins and heart score indicates close outpatient follow-up versus inpatient observation.  Patient does  not wish to be admitted.  I did discuss with her the possibility of ongoing undiagnosed heart disease but she does not wish to stay in the hospital.  She cites COVID-19 as a reason for wanting to go home.  She understands the risk.  Second troponin was performed.  It resulted at 17.  As the net change was 7, chest pain algorithm pathway recommends observation admission.  After extensive discussions with the patient, she has agreed to admission.  Final Clinical Impressions(s) / ED Diagnoses   Final diagnoses:  Chronic midline thoracic back pain  Paresthesia  Chest pain, unspecified type    ED Discharge Orders    None       Orpah Greek, MD 12/17/18 [phone]

## 2018-12-17 NOTE — Progress Notes (Signed)
EEG complete - results pending 

## 2018-12-17 NOTE — Progress Notes (Signed)
PT Cancellation Note  Patient Details Name: [name] MRN: [id number] DOB: 1936-05-11   Cancelled Treatment:    Reason Eval/Treat Not Completed: Patient declined, no reason specified  Patient just received dinner tray and was working on sitting up at EOB for dinner. Reported her back was hurting from the tests she had just finished. Pt requested that we come back tomorrow when her back might not be as sore and so she could eat her food while it was hot. Will return tomorrow in AM to follow up with patient. Alerted RN to pt's complaint of back pain.  Kipp Brood, PT, DPT, [facility] Physical Therapist with [facility]  12/17/2018 5:26 PM

## 2018-12-17 NOTE — Consult Note (Addendum)
Cynthia A. Merlene Laughter, MD     [email or web address]          [name] is an 83 y.o. female.   ASSESSMENT/PLAN: 1.  Recurrent episodes of discomfort involving the upper extremities and chest.  I suspect that the patient most likely has complex partial seizures given her remote history of severe head injuries in the past.  Consequently, the patient will be started on low-dose Keppra.  Other possibilities such as ischemia and migraine headaches are less likely.  2.  Long-term anticoagulation: Unclear what the indications are but I do not see a clear indication from stroke perspective.    Patient 83 year old white female who presents with recurrent episodes of discomfort and numbness involving the left chest region, left upper extremity and right upper extremity associated with local confusion.  She has had a few of these episodes.  She decided to seek medical attention after she had 2 on the day of admission.  The second event was quite intense.  She does not report having heaviness of the extremities.  She denies loss of consciousness.  She denies frank chest pain or dyspnea or palpitation.  She does not report having headaches.  She tells me that as a young adult she apparently was diagnosed as having seizures in [place] by a neurologist there.  She reports that she had spells of confusion like her current event although they were somewhat different.  The spells were not associated with focal neurological deficits or convulsion.  She was treated with seizure medications at that time.  She apparently did see Dr. Floyde Parkins in [place] and was told that she was having TIA per the patient but she tells me that they were related to her head injuries.  She has not seen him in a long time however.  The patient reports having low back pain.  She apparently pulled something while she was lifting in the midst of 1 of these events.  The patient reports that she had a severe  head injury as a child.  She has a large scar involving the right frontal region.  She apparently was comatose for an extended period of time. The review of systems otherwise negative.    GENERAL: This a pleasant thin female who is in no acute distress.  HEENT:   There is a large scar involving the right frontal temporal region measuring about 8 in.  No acute changes are reported.  Neck is supple.  ABDOMEN: soft  EXTREMITIES: No edema; there is significant arthritic changes of the knees  BACK: Normal  SKIN: Normal by inspection.    MENTAL STATUS: Alert and oriented. Speech, language and cognition are generally intact. Judgment and insight normal.   CRANIAL NERVES: Pupils are equal, round and reactive to light and accomodation; extra ocular movements are full, there is no significant nystagmus; visual fields are full; upper and lower facial muscles are normal in strength and symmetric, there is no flattening of the nasolabial folds; tongue is midline; uvula is midline; shoulder elevation is normal.  MOTOR: Normal tone, bulk and strength; no pronator drift.  COORDINATION: Left finger to nose is normal, right finger to nose is normal, No rest tremor; no intention tremor; no postural tremor; no bradykinesia.  REFLEXES: Deep tendon reflexes are symmetrical and normal.   SENSATION: Normal to light touch, temperature, and pain.       Blood pressure (!) 121/57, pulse 63, temperature 98.5 F (36.9 C), resp. rate 19, height 5' (1.524  m), weight 59.2 kg, SpO2 99 %.  Past Medical History:  Diagnosis Date   Adenomatous colon polyp 2008   Asthma    Complication of anesthesia    CVA (cerebral vascular accident) ([place])    sl weaker on rt foot   Diverticulosis    GERD (gastroesophageal reflux disease)    Hemorrhoids 2007   HTN (hypertension)    Hyperplastic colon polyp 2005   Hypothyroidism    Narcolepsy    PONV (postoperative nausea and vomiting)    TIA (transient  ischemic attack)     Past Surgical History:  Procedure Laterality Date   ABDOMINAL HYSTERECTOMY     age 10, complicated by poor wound healing, followed by revision of scar and radiation for ?malignancy   BACK SURGERY     BREAST ENHANCEMENT SURGERY     BREAST IMPLANT EXCHANGE Right 06/23/2016   Procedure: RIGHT BREAST IMPLANT REMOVAL AND REPLACEMENT;  Surgeon: Cristine Polio, MD;  Location: [place];  Service: Plastics;  Laterality: Right;   COLONOSCOPY  2005   2 hyperplastic polyps   COLONOSCOPY  2007   Dr. Oneida Alar- hemorrhoids   COLONOSCOPY  2008   Dr. Reed Pandy polyp- rare sigmoid diverticulosis, internal hemorrhoids   COLONOSCOPY  12/01/2011   Procedure: COLONOSCOPY;  Surgeon: Danie Binder, MD;  [facility];  Service: Endoscopy;  Laterality: N/A;  12:30 PM   ESOPHAGOGASTRODUODENOSCOPY (EGD) WITH ESOPHAGEAL DILATION N/A 04/15/2013   Procedure: ESOPHAGOGASTRODUODENOSCOPY (EGD) WITH ESOPHAGEAL DILATION;  Surgeon: Danie Binder, MD;  [facility];  Service: Endoscopy;  Laterality: N/A;  11:45-moved to 12:30 Darius Bump to notify pt   EYE SURGERY  2010   EYE SURGERY     IR KYPHO LUMBAR INC FX REDUCE BONE BX UNI/BIL CANNULATION INC/IMAGING  08/23/2018   right thumb surgery      Family History  Problem Relation Age of Onset   Colon cancer Father        age 24   Prostate cancer Father    Pancreatic cancer Mother        age 87    Social History:  reports that she has never smoked. She has never used smokeless tobacco. She reports that she does not drink alcohol or use drugs.  Allergies:  Allergies  Allergen Reactions   Epinephrine Anaphylaxis   Demerol  [Meperidine Hcl]    Demerol [Meperidine] Other (See Comments)    headaches   Vicodin [Hydrocodone-Acetaminophen] Other (See Comments)    [name] it makes her crazy. Pt tolerates acetaminophen    Medications: Prior to Admission medications   Medication Sig  Start Date End Date Taking? Authorizing Provider  albuterol (PROVENTIL) (2.5 MG/3ML) 0.083% nebulizer solution Take 3 mLs (2.5 mg total) by nebulization every 6 (six) hours as needed for wheezing or shortness of breath. 09/08/18  Yes Gerlene Fee, NP  albuterol (VENTOLIN HFA) 108 (90 Base) MCG/ACT inhaler Inhale 1-2 puffs into the lungs every 6 (six) hours as needed for wheezing or shortness of breath.   Yes [provider]  amLODipine (NORVASC) 5 MG tablet Take 1 tablet (5 mg total) by mouth daily. 09/08/18  Yes Gerlene Fee, NP  Ascorbic Acid (VITAMIN C) 1000 MG tablet Take 1,000 mg by mouth daily.   Yes [provider]  B Complex Vitamins (VITAMIN B COMPLEX) TABS Take 1 tablet by mouth daily.   Yes [provider]  Biotin 300 MCG TABS Take 1 tablet by mouth daily after supper.  Yes [provider]  buPROPion (WELLBUTRIN XL) 150 MG 24 hr tablet Take 1 tablet (150 mg total) by mouth daily. 09/08/18  Yes Gerlene Fee, NP  Calcium Carbonate 500 MG CHEW Chew 1 tablet by mouth daily. 08/24/18  Yes [provider]  cholecalciferol (VITAMIN D) 1000 UNITS tablet Take 1,000 Units by mouth daily.   Yes [provider]  conjugated estrogens (PREMARIN) vaginal cream Apply 1 application topically daily as needed.   Yes [provider]  cyanocobalamin 100 MCG tablet Take 1 tablet by mouth daily.  08/24/18  Yes [provider]  ezetimibe (ZETIA) 10 MG tablet Take 1 tablet (10 mg total) by mouth daily. 09/08/18  Yes Gerlene Fee, NP  Ferrous Gluconate (IRON) 240 (27 Fe) MG TABS Take 1 tablet by mouth daily. 09/08/18  Yes Gerlene Fee, NP  Flaxseed, Linseed, (FLAXSEED OIL) 1000 MG CAPS Take 1 capsule by mouth.    Yes [provider]  furosemide (LASIX) 40 MG tablet Take 1 tablet (40 mg total) by mouth every morning. 09/08/18  Yes Gerlene Fee, NP  Lecithin 1200 MG CAPS Take 1 capsule by mouth daily.   Yes [provider]  levothyroxine (SYNTHROID, LEVOTHROID) 50 MCG tablet Take 1 tablet (50 mcg total) by mouth daily. 09/08/18  Yes Gerlene Fee, NP  Lutein 6 MG CAPS Take 1 capsule by mouth daily. 08/24/18  Yes [provider]  Magnesium 250 MG TABS Take 1 tablet by mouth daily.   Yes [provider]  metoprolol tartrate (LOPRESSOR) 25 MG tablet Take 1 tablet (25 mg total) by mouth 2 (two) times daily. Take with food / meals 09/08/18  Yes Gerlene Fee, NP  NON FORMULARY Diet Type:  NAS 08/24/18  Yes [provider]  Omega-3 Fatty Acids (FISH OIL) 1200 MG CAPS Take 1,200 mg by mouth daily.   Yes [provider]  pantoprazole (PROTONIX) 40 MG tablet Take 1 tablet (40 mg total) by mouth daily as needed. Patient taking differently: Take 40 mg by mouth daily.  09/08/18  Yes Gerlene Fee, NP  polyethylene glycol (MIRALAX / GLYCOLAX) packet Take 17 g by mouth daily. Patient taking differently: Take 17 g by mouth daily as needed.  08/25/18  Yes Tat, Shanon Brow, MD  potassium chloride (K-DUR) 10 MEQ tablet Take 1 tablet (10 mEq total) by mouth 2 (two) times daily. 09/08/18  Yes Gerlene Fee, NP  Turmeric 500 MG TABS Take by mouth daily.   Yes [provider]  warfarin (COUMADIN) 4 MG tablet Take 1 tablet (4 mg total) by mouth daily. 09/08/18  Yes Gerlene Fee, NP  Zinc 50 MG TABS Take 1 tablet by mouth daily.   Yes [provider]    Scheduled Meds:  amLODipine  5 mg Oral Daily   buPROPion  150 mg Oral Daily   calcium carbonate  1 tablet Oral Q breakfast   ezetimibe  10 mg Oral Daily   [START ON 12/18/2018] ferrous gluconate  324 mg Oral Daily   [START ON 12/19/2018] furosemide  40 mg Oral BH-q7a   heparin  5,000 Units Subcutaneous Q8H   [START ON 12/18/2018] levothyroxine  50 mcg Oral Daily   metoprolol tartrate  25 mg Oral BID   pantoprazole  40 mg Oral Daily   potassium chloride  30 mEq Oral BID   cyanocobalamin  100 mcg Oral  Daily   Warfarin - Pharmacist Dosing Inpatient   Does  not apply q1800   Continuous Infusions:  0.9 % NaCl with KCl 20 mEq / L 35 mL/hr at 12/17/18 1811   PRN Meds:.acetaminophen, albuterol, morphine injection, nitroGLYCERIN, ondansetron (ZOFRAN) IV, polyethylene glycol     Results for orders placed or performed during the hospital encounter of 12/17/18 (from the past 48 hour(s))  CBC with Differential/Platelet     Status: None   Collection Time: 12/17/18  2:40 AM  Result Value Ref Range   WBC 6.5 4.0 - 10.5 K/uL   RBC 4.81 3.87 - 5.11 MIL/uL   Hemoglobin 14.0 12.0 - 15.0 g/dL   HCT 43.6 36.0 - 46.0 %   MCV 90.6 80.0 - 100.0 fL   MCH 29.1 26.0 - 34.0 pg   MCHC 32.1 30.0 - 36.0 g/dL   RDW 14.4 11.5 - 15.5 %   Platelets 297 150 - 400 K/uL   nRBC 0.0 0.0 - 0.2 %   Neutrophils Relative % 51 %   Neutro Abs 3.3 1.7 - 7.7 K/uL   Lymphocytes Relative 34 %   Lymphs Abs 2.2 0.7 - 4.0 K/uL   Monocytes Relative 10 %   Monocytes Absolute 0.7 0.1 - 1.0 K/uL   Eosinophils Relative 4 %   Eosinophils Absolute 0.3 0.0 - 0.5 K/uL   Basophils Relative 1 %   Basophils Absolute 0.1 0.0 - 0.1 K/uL   Immature Granulocytes 0 %   Abs Immature Granulocytes 0.01 0.00 - 0.07 K/uL    Comment: Performed at [facility], [address] [place], [place] [address]  Basic metabolic panel     Status: Abnormal   Collection Time: 12/17/18  2:40 AM  Result Value Ref Range   Sodium 141 135 - 145 mmol/L   Potassium 3.0 (L) 3.5 - 5.1 mmol/L   Chloride 103 98 - 111 mmol/L   CO2 28 22 - 32 mmol/L   Glucose, Bld 102 (H) 70 - 99 mg/dL   BUN 20 8 - 23 mg/dL   Creatinine, Ser 1.21 (H) 0.44 - 1.00 mg/dL   Calcium 9.3 8.9 - 10.3 mg/dL   GFR calc non Af Amer 42 (L) >60 mL/min   GFR calc Af Amer 49 (L) >60 mL/min   Anion gap 10 5 - 15    Comment: Performed at [facility], [address], [place], [place] [address]  Troponin I (High Sensitivity)     Status: None   Collection Time: 12/17/18  2:40 AM  Result Value  Ref Range   Troponin I (High Sensitivity) 10 <18 ng/L    Comment: (NOTE) Elevated high sensitivity troponin I (hsTnI) values and significant  changes across serial measurements may suggest ACS but many other  chronic and acute conditions are known to elevate hsTnI results.  Refer to the "Links" section for chest pain algorithms and additional  guidance. Performed at [facility], [address], [place], [place] [address]   Troponin I (High Sensitivity)     Status: None   Collection Time: 12/17/18  4:20 AM  Result Value Ref Range   Troponin I (High Sensitivity) 17 <18 ng/L    Comment: (NOTE) Elevated high sensitivity troponin I (hsTnI) values and significant  changes across serial measurements may suggest ACS but many other  chronic and acute conditions are known to elevate hsTnI results.  Refer to the "Links" section for chest pain algorithms and additional  guidance. Performed at [facility], [address], [place], [place] [address]   SARS Coronavirus 2 ([place] - Performed  in [place] lab), [facility] Order     Status: None   Collection Time: 12/17/18  6:12 AM   Specimen: Nasopharyngeal Swab  Result Value Ref Range   SARS Coronavirus 2 NEGATIVE NEGATIVE    Comment: (NOTE) If result is NEGATIVE SARS-CoV-2 target nucleic acids are NOT DETECTED. The SARS-CoV-2 RNA is generally detectable in upper and lower  respiratory specimens during the acute phase of infection. The lowest  concentration of SARS-CoV-2 viral copies this assay can detect is 250  copies / mL. A negative result does not preclude SARS-CoV-2 infection  and should not be used as the sole basis for treatment or other  patient management decisions.  A negative result may occur with  improper specimen collection / handling, submission of specimen other  than nasopharyngeal swab, presence of viral mutation(s) within the  areas targeted by this assay, and inadequate number of viral copies  (<250 copies / mL). A  negative result must be combined with clinical  observations, patient history, and epidemiological information. If result is POSITIVE SARS-CoV-2 target nucleic acids are DETECTED. The SARS-CoV-2 RNA is generally detectable in upper and lower  respiratory specimens dur ing the acute phase of infection.  Positive  results are indicative of active infection with SARS-CoV-2.  Clinical  correlation with patient history and other diagnostic information is  necessary to determine patient infection status.  Positive results do  not rule out bacterial infection or co-infection with other viruses. If result is PRESUMPTIVE POSTIVE SARS-CoV-2 nucleic acids MAY BE PRESENT.   A presumptive positive result was obtained on the submitted specimen  and confirmed on repeat testing.  While 2019 novel coronavirus  (SARS-CoV-2) nucleic acids may be present in the submitted sample  additional confirmatory testing may be necessary for epidemiological  and / or clinical management purposes  to differentiate between  SARS-CoV-2 and other Sarbecovirus currently known to infect humans.  If clinically indicated additional testing with an alternate test  methodology [phone]) is advised. The SARS-CoV-2 RNA is generally  detectable in upper and lower respiratory sp ecimens during the acute  phase of infection. The expected result is Negative. Fact Sheet for Patients:  StrictlyIdeas.no Fact Sheet for Healthcare Providers: BankingDealers.co.za This test is not yet approved or cleared by the [place] FDA and has been authorized for detection and/or diagnosis of SARS-CoV-2 by FDA under an Emergency Use Authorization (EUA).  This EUA will remain in effect (meaning this test can be used) for the duration of the COVID-19 declaration under Section 564(b)(1) of the Act, 21 U.S.C. section 360bbb-3(b)(1), unless the authorization is terminated or revoked sooner. Performed  at [facility], [address] [place], [place] [address]   CBC     Status: None   Collection Time: 12/17/18  1:56 PM  Result Value Ref Range   WBC 6.4 4.0 - 10.5 K/uL   RBC 4.41 3.87 - 5.11 MIL/uL   Hemoglobin 12.9 12.0 - 15.0 g/dL   HCT 40.7 36.0 - 46.0 %   MCV 92.3 80.0 - 100.0 fL   MCH 29.3 26.0 - 34.0 pg   MCHC 31.7 30.0 - 36.0 g/dL   RDW 14.1 11.5 - 15.5 %   Platelets 271 150 - 400 K/uL   nRBC 0.0 0.0 - 0.2 %    Comment: Performed at [facility], [address], [place], [place] [address]  Creatinine, serum     Status: Abnormal   Collection Time: 12/17/18  1:56 PM  Result Value Ref Range  Creatinine, Ser 1.10 (H) 0.44 - 1.00 mg/dL   GFR calc non Af Amer 47 (L) >60 mL/min   GFR calc Af Amer 55 (L) >60 mL/min    Comment: Performed at [facility], [address], [place], [place] [address]  Troponin I (High Sensitivity)     Status: Abnormal   Collection Time: 12/17/18  1:56 PM  Result Value Ref Range   Troponin I (High Sensitivity) 35 (H) <18 ng/L    Comment: (NOTE) Elevated high sensitivity troponin I (hsTnI) values and significant  changes across serial measurements may suggest ACS but many other  chronic and acute conditions are known to elevate hsTnI results.  Refer to the "Links" section for chest pain algorithms and additional  guidance. Performed at [facility], [address] [place], [place] [address]   Lipid panel     Status: Abnormal   Collection Time: 12/17/18  1:56 PM  Result Value Ref Range   Cholesterol 220 (H) 0 - 200 mg/dL   Triglycerides 146 <150 mg/dL   HDL 60 >40 mg/dL   Total CHOL/HDL Ratio 3.7 RATIO   VLDL 29 0 - 40 mg/dL   LDL Cholesterol 131 (H) 0 - 99 mg/dL    Comment:        Total Cholesterol/HDL:CHD Risk Coronary Heart Disease Risk Table                     Men   Women  1/2 Average Risk   3.4   3.3  Average Risk       5.0   4.4  2 X Average Risk   9.6   7.1  3 X Average Risk  23.4   11.0        Use the calculated Patient  Ratio above and the CHD Risk Table to determine the patient's CHD Risk.        ATP III CLASSIFICATION (LDL):  <100     mg/dL   Optimal  100-129  mg/dL   Near or Above                    Optimal  130-159  mg/dL   Borderline  160-189  mg/dL   High  >190     mg/dL   Very High Performed at [facility] [place], [place] [address]   Magnesium     Status: Abnormal   Collection Time: 12/17/18  1:56 PM  Result Value Ref Range   Magnesium 2.5 (H) 1.7 - 2.4 mg/dL    Comment: Performed at [facility], [address], [place], [place] [address]  Protime-INR     Status: Abnormal   Collection Time: 12/17/18  2:11 PM  Result Value Ref Range   Prothrombin Time 19.2 (H) 11.4 - 15.2 seconds   INR 1.6 (H) 0.8 - 1.2    Comment: (NOTE) INR goal varies based on device and disease states. Performed at [facility], [address], [place], [place] [address]     Studies/Results:  HEAD CT  FINDINGS: Brain: There is central and cortical atrophy. Periventricular white matter changes are consistent with small vessel disease. There is encephalomalacia in the RIGHT frontal lobe. There is no intra or extra-axial fluid collection. The basilar cisterns and ventricles have a normal appearance. There is no CT evidence for acute infarction or hemorrhage. The 1 centimeter calcification is identified in the LEFT frontal region, adjacent to the calvarium and consistent with meningeal calcification or small  meningioma. There is no mass effect.  Vascular: There is atherosclerotic calcification of the internal carotid arteries. No hyperdense vessels.  Skull: Normal. Negative for fracture or focal lesion.  Sinuses/Orbits: No acute finding.  Other: None  IMPRESSION: 1. Atrophy and small vessel disease. 2. Encephalomalacia of the RIGHT frontal lobe. 3. No evidence for acute intracranial abnormality.    The head CT scan is reviewed in person.   There is moderate global atrophy  especially involving the frontal lobes bilaterally.  There is the left frontal small extracranial calcified lesion possibly small meningioma.  No clear encephalomalacia is appreciated.  No acute findings.    Eloni Darius A. Merlene [name], M.D.  Diplomate, Tax adviser of Psychiatry and Neurology ( Neurology). 12/17/2018, 6:18 PM

## 2018-12-17 NOTE — Procedures (Addendum)
Patient Name: [name]  MRN: [id number]  Epilepsy Attending: Lora Havens  Referring Physician/Provider: Dr Irwin Brakeman Date: 12/17/18 Duration: 26.23 mins  Patient history: 83yo female with history of seizures who presented with chest pain and paresthesias. EEG to evaluate for seizure.  Level of alertness: awake, asleep  AEDs during EEG study: None  Technical aspects: This EEG study was done with scalp electrodes positioned according to the 10-20 International system of electrode placement. Electrical activity was reviewed with a high frequency filter of 70Hz  and a low frequency filter of 1Hz . EEG data were recorded continuously and digitally stored.   BACKGROUND ACTIVITY: Posterior dominant rhythm: The posterior dominant rhythm consists of 9-10 Hz activity of moderate voltage (25-35 uV) seen predominantly in posterior head regions, symmetric and reactive to eye opening and eye closing.          SLEEP RECORDINGS:  Vertex waves, sleep spindles, slow wave sleep was recorded.  IMPRESSION: This study is within normal limits. No seizures or epileptiform discharges were seen throughout the recording.  Yasmine Kilbourne Barbra Sarks

## 2018-12-17 NOTE — H&P (Addendum)
History and Physical  [name] PIR:[id number] DOB: [birth year] DOA: 12/17/2018  Referring physician: Betsey Holiday  PCP: Celene Squibb, MD   Chief Complaint: chest pain  HPI: [name] is a 83 y.o. female with no known history of CAD presented to ED with complaint of chest pain and describes her symptoms as chest heaviness which led to numbness and weakness in bilateral upper extremities.  She reported that the chest discomfort had resolved by the time she arrived.  She says it has been a recurrent problem.  She is not a smoker.  She does not have any history of coronary disease that she reports.  She describes that the heaviness with across the center of her chest and she had some tingling in both arms.  She had no shortness of breath.  She has no family history of heart disease.  She is status post kyphoplasty 3 months ago.  She does not take pain medications other than Tylenol.  ED course.  The patient's troponins have been unremarkable.  However there was mild changes in her troponin that prompted the ED doctor to request inpatient observation.  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  She denies vision changes. She woke up with strange sensation of her upper extremities feeling numb. She reports frequent TIAs. She has seen Dr. Jannifer Franklin of neurology in [place] years ago.     Review of Systems: All systems reviewed and apart from history of presenting illness, are negative.  Past Medical History:  Diagnosis Date  . Adenomatous colon polyp 2008  . Asthma   . Complication of anesthesia   . CVA (cerebral vascular accident) ([place])    sl weaker on rt foot  . Diverticulosis   . GERD (gastroesophageal reflux disease)   . Hemorrhoids 2007  . HTN (hypertension)   . Hyperplastic colon polyp 2005  . Hypothyroidism   . Narcolepsy   . PONV (postoperative nausea and vomiting)    . TIA (transient ischemic attack)    Past Surgical History:  Procedure Laterality Date  . ABDOMINAL HYSTERECTOMY     age 49, complicated by poor wound healing, followed by revision of scar and radiation for ?malignancy  . BACK SURGERY    . BREAST ENHANCEMENT SURGERY    . BREAST IMPLANT EXCHANGE Right 06/23/2016   Procedure: RIGHT BREAST IMPLANT REMOVAL AND REPLACEMENT;  Surgeon: Cristine Polio, MD;  Location: [facility];  Service: Plastics;  Laterality: Right;  . COLONOSCOPY  2005   2 hyperplastic polyps  . COLONOSCOPY  2007   Dr. Oneida Alar- hemorrhoids  . COLONOSCOPY  2008   Dr. Reed Pandy polyp- rare sigmoid diverticulosis, internal hemorrhoids  . COLONOSCOPY  12/01/2011   Procedure: COLONOSCOPY;  Surgeon: Danie Binder, MD;  [facility];  Service: Endoscopy;  Laterality: N/A;  12:30 PM  . ESOPHAGOGASTRODUODENOSCOPY (EGD) WITH ESOPHAGEAL DILATION N/A 04/15/2013   Procedure: ESOPHAGOGASTRODUODENOSCOPY (EGD) WITH ESOPHAGEAL DILATION;  Surgeon: Danie Binder, MD;  [facility];  Service: Endoscopy;  Laterality: N/A;  11:45-moved to 12:30 Darius Bump to notify pt  . EYE SURGERY  2010  . EYE SURGERY    . IR KYPHO LUMBAR INC FX REDUCE BONE BX UNI/BIL CANNULATION INC/IMAGING  08/23/2018  . right thumb surgery     Social History:  reports that she has never smoked. She has never used  smokeless tobacco. She reports that she does not drink alcohol or use drugs.  Allergies  Allergen Reactions  . Epinephrine Anaphylaxis  . Demerol  [Meperidine Hcl]   . Demerol [Meperidine] Other (See Comments)    headaches  . Vicodin [Hydrocodone-Acetaminophen] Other (See Comments)    [name] it makes her crazy. Pt tolerates acetaminophen    Family History  Problem Relation Age of Onset  . Colon cancer Father        age 65  . Prostate cancer Father   . Pancreatic cancer Mother        age 51    Prior to Admission medications   Medication Sig Start Date End  Date Taking? Authorizing Provider  albuterol (PROVENTIL) (2.5 MG/3ML) 0.083% nebulizer solution Take 3 mLs (2.5 mg total) by nebulization every 6 (six) hours as needed for wheezing or shortness of breath. 09/08/18  Yes Gerlene Fee, NP  albuterol (VENTOLIN HFA) 108 (90 Base) MCG/ACT inhaler Inhale 1-2 puffs into the lungs every 6 (six) hours as needed for wheezing or shortness of breath.   Yes [provider]  amLODipine (NORVASC) 5 MG tablet Take 1 tablet (5 mg total) by mouth daily. 09/08/18  Yes Gerlene Fee, NP  Ascorbic Acid (VITAMIN C) 1000 MG tablet Take 1,000 mg by mouth daily.   Yes [provider]  B Complex Vitamins (VITAMIN B COMPLEX) TABS Take 1 tablet by mouth daily.   Yes [provider]  Biotin 300 MCG TABS Take 1 tablet by mouth daily after supper.    Yes [provider]  buPROPion (WELLBUTRIN XL) 150 MG 24 hr tablet Take 1 tablet (150 mg total) by mouth daily. 09/08/18  Yes Gerlene Fee, NP  Calcium Carbonate 500 MG CHEW Chew 1 tablet by mouth daily. 08/24/18  Yes [provider]  cholecalciferol (VITAMIN D) 1000 UNITS tablet Take 1,000 Units by mouth daily.   Yes [provider]  conjugated estrogens (PREMARIN) vaginal cream Apply 1 application topically daily as needed.   Yes [provider]  cyanocobalamin 100 MCG tablet Take 1 tablet by mouth daily.  08/24/18  Yes [provider]  ezetimibe (ZETIA) 10 MG tablet Take 1 tablet (10 mg total) by mouth daily. 09/08/18  Yes Gerlene Fee, NP  Ferrous Gluconate (IRON) 240 (27 Fe) MG TABS Take 1 tablet by mouth daily. 09/08/18  Yes Gerlene Fee, NP  Flaxseed, Linseed, (FLAXSEED OIL) 1000 MG CAPS Take 1 capsule by mouth.    Yes [provider]  furosemide (LASIX) 40 MG tablet Take 1 tablet (40 mg total) by mouth every morning. 09/08/18  Yes Gerlene Fee, NP  Lecithin 1200 MG CAPS Take 1 capsule by mouth daily.   Yes [provider]   levothyroxine (SYNTHROID, LEVOTHROID) 50 MCG tablet Take 1 tablet (50 mcg total) by mouth daily. 09/08/18  Yes Gerlene Fee, NP  Lutein 6 MG CAPS Take 1 capsule by mouth daily. 08/24/18  Yes [provider]  Magnesium 250 MG TABS Take 1 tablet by mouth daily.   Yes [provider]  metoprolol tartrate (LOPRESSOR) 25 MG tablet Take 1 tablet (25 mg total) by mouth 2 (two) times daily. Take with food / meals 09/08/18  Yes Gerlene Fee, NP  NON FORMULARY Diet Type:  NAS 08/24/18  Yes [provider]  Omega-3 Fatty Acids (FISH OIL) 1200 MG CAPS Take 1,200 mg by mouth daily.   Yes [provider]  pantoprazole (  PROTONIX) 40 MG tablet Take 1 tablet (40 mg total) by mouth daily as needed. Patient taking differently: Take 40 mg by mouth daily.  09/08/18  Yes Gerlene Fee, NP  polyethylene glycol (MIRALAX / GLYCOLAX) packet Take 17 g by mouth daily. Patient taking differently: Take 17 g by mouth daily as needed.  08/25/18  Yes Tat, Shanon Brow, MD  potassium chloride (K-DUR) 10 MEQ tablet Take 1 tablet (10 mEq total) by mouth 2 (two) times daily. 09/08/18  Yes Gerlene Fee, NP  Turmeric 500 MG TABS Take by mouth daily.   Yes [provider]  warfarin (COUMADIN) 4 MG tablet Take 1 tablet (4 mg total) by mouth daily. 09/08/18  Yes Gerlene Fee, NP  Zinc 50 MG TABS Take 1 tablet by mouth daily.   Yes [provider]   Physical Exam: Vitals:   12/17/18 1045 12/17/18 1100 12/17/18 1115 12/17/18 1208  BP:  136/72  (!) 121/57  Pulse: 77 81 64 63  Resp: 13 18 15 19   Temp:    98.5 F (36.9 C)  TempSrc:      SpO2: 100% (!) 83% 100% 99%  Weight:    59.2 kg  Height:    5' (1.524 m)     General exam: Moderately built and nourished patient, lying comfortably supine on the gurney in no obvious distress.  Head, eyes and ENT: Nontraumatic and normocephalic. Pupils equally reacting to light and accommodation. Oral mucosa moist.  Neck: Supple. No JVD,  carotid bruit or thyromegaly.  Lymphatics: No lymphadenopathy.  Respiratory system: Clear to auscultation. No increased work of breathing.  Cardiovascular system: S1 and S2 heard, RRR. No JVD, murmurs, gallops, clicks or pedal edema.  Gastrointestinal system: Abdomen is nondistended, soft and nontender. Normal bowel sounds heard. No organomegaly or masses appreciated.  Central nervous system: Alert and oriented. No focal neurological deficits.  Extremities: Symmetric 5 x 5 power. Peripheral pulses symmetrically felt.   Skin: No rashes or acute findings.  Musculoskeletal system: Negative exam.  Psychiatry: Pleasant and cooperative.  Labs on Admission:  Basic Metabolic Panel: Recent Labs  Lab 12/17/18 0240  NA 141  K 3.0*  CL 103  CO2 28  GLUCOSE 102*  BUN 20  CREATININE 1.21*  CALCIUM 9.3   Liver Function Tests: No results for input(s): AST, ALT, ALKPHOS, BILITOT, PROT, ALBUMIN in the last 168 hours. No results for input(s): LIPASE, AMYLASE in the last 168 hours. No results for input(s): AMMONIA in the last 168 hours. CBC: Recent Labs  Lab 12/17/18 0240  WBC 6.5  NEUTROABS 3.3  HGB 14.0  HCT 43.6  MCV 90.6  PLT 297   Cardiac Enzymes: No results for input(s): CKTOTAL, CKMB, CKMBINDEX, TROPONINI in the last 168 hours.  BNP (last 3 results) No results for input(s): PROBNP in the last 8760 hours. CBG: No results for input(s): GLUCAP in the last 168 hours.  Radiological Exams on Admission: Dg Chest 2 View  Result Date: 12/17/2018 CLINICAL DATA:  Chest pain. Weakness. EXAM: CHEST - 2 VIEW COMPARISON:  06/27/2013 FINDINGS: The cardiomediastinal contours are normal. Aortic atherosclerosis. The lungs are clear. Pulmonary vasculature is normal. No consolidation, pleural effusion, or pneumothorax. No acute osseous abnormalities are seen. Chronic T12 compression fracture with vertebroplasty. Peripherally calcified breast implants. IMPRESSION: No acute chest findings.  Electronically Signed   By: Keith Rake M.D.   On: 12/17/2018 03:01   EKG: Personally reviewed.   Assessment/Plan Principal Problem:   Chest pain Active Problems:  Chronic anticoagulation   Chronic diastolic CHF (congestive heart failure) (HCC)   Acquired hypothyroidism   Essential hypertension   H/O: CVA (cerebrovascular accident)   Depression, recurrent (HCC)   Paresthesia   Chronic midline thoracic back pain  1. Atypical chest pain - Pt will be observed and have serial troponin tests and echocardiogram ordered. Pt refuses morphine or any pain medication other than tylenol. NTG ordered as needed.  Will follow up echocardiogram. Hopefully can go home tomorrow if rules out. Aspirin ordered.  Check lipid panel.  She has seen Dr. Bronson Ing in past but was told that she did not have significant CAD.  2. Paresthesias - possible TIA given history of frequent TIA.  symptoms have resolved, neuro checks ordered.   Follow clinically. Check CT head.  Neuro consult.  I wonder if she is having absence seizures. Check EEG.  Neuro consult pending.  3. Hypertension - resume home medications.  4. Dyslipidemia - resume home medications.  5. Iron deficiency anemia - resume home ferrous gluconate.  6. Hypothyroidism - resume levothyroxine 50 mcg daily.  7. Hypokalemia - replacement ordered, check Magnesium.  8. Chronic anticoagulation - She is on warfarin managed by her PCP.  Will ask pharm D to monitor while in hospital. Check PT/INR.  9. S/p recent kyphoplasty - Pt had this done 3 months ago.  Pain managed with tylenol.    DVT Prophylaxis: heparin Code Status: Full   Family Communication: patient bedside  Disposition Plan: home tomorrow if stable    Time spent: 57 minutes   Clanford Wynetta Emery, MD [facility] How to contact the [facility] Attending or Consulting provider [place] or covering provider during after hours [place], for this patient?  1. Check the care team in [facility] and look for a)  attending/consulting [facility] provider listed and b) the [facility] team listed 2. Log into [email or web address] and use [place]'s universal password to access. If you do not have the password, please contact the hospital operator. 3. Locate the [facility] provider you are looking for under [facility] and page to a number that you can be directly reached. 4. If you still have difficulty reaching the provider, please page the [facility] (Director on Call) for the Hospitalists listed on amion for assistance.

## 2018-12-17 NOTE — Progress Notes (Signed)
Patient in radiology for CT and carotid studies. Checked on patient at approx. 1550 and EEG was in room just starting the EEG hookup for exam. Nurse informed of echo attempts. Stated would do echo in a.m.  Alvino Chapel, RCS

## 2018-12-17 NOTE — Progress Notes (Signed)
Pt in CT should be back shortly per RN. Will try back agin in a few.

## 2018-12-18 ENCOUNTER — Observation Stay (HOSPITAL_BASED_OUTPATIENT_CLINIC_OR_DEPARTMENT_OTHER): Payer: PPO

## 2018-12-18 DIAGNOSIS — I340 Nonrheumatic mitral (valve) insufficiency: Secondary | ICD-10-CM | POA: Diagnosis not present

## 2018-12-18 DIAGNOSIS — M546 Pain in thoracic spine: Secondary | ICD-10-CM | POA: Diagnosis not present

## 2018-12-18 DIAGNOSIS — R079 Chest pain, unspecified: Secondary | ICD-10-CM | POA: Diagnosis not present

## 2018-12-18 DIAGNOSIS — Z7901 Long term (current) use of anticoagulants: Secondary | ICD-10-CM | POA: Diagnosis not present

## 2018-12-18 DIAGNOSIS — R202 Paresthesia of skin: Secondary | ICD-10-CM

## 2018-12-18 DIAGNOSIS — I10 Essential (primary) hypertension: Secondary | ICD-10-CM | POA: Diagnosis not present

## 2018-12-18 DIAGNOSIS — I5032 Chronic diastolic (congestive) heart failure: Secondary | ICD-10-CM | POA: Diagnosis not present

## 2018-12-18 DIAGNOSIS — G8929 Other chronic pain: Secondary | ICD-10-CM | POA: Diagnosis not present

## 2018-12-18 DIAGNOSIS — E039 Hypothyroidism, unspecified: Secondary | ICD-10-CM | POA: Diagnosis not present

## 2018-12-18 LAB — BASIC METABOLIC PANEL
Anion gap: 7 (ref 5–15)
BUN: 24 mg/dL — ABNORMAL HIGH (ref 8–23)
CO2: 25 mmol/L (ref 22–32)
Calcium: 8.8 mg/dL — ABNORMAL LOW (ref 8.9–10.3)
Chloride: 109 mmol/L (ref 98–111)
Creatinine, Ser: 1.05 mg/dL — ABNORMAL HIGH (ref 0.44–1.00)
GFR calc Af Amer: 58 mL/min — ABNORMAL LOW (ref >60)
GFR calc non Af Amer: 50 mL/min — ABNORMAL LOW (ref >60)
Glucose, Bld: 82 mg/dL (ref 70–99)
Potassium: 4.5 mmol/L (ref 3.5–5.1)
Sodium: 141 mmol/L (ref 135–145)

## 2018-12-18 LAB — MAGNESIUM: Magnesium: 2.7 mg/dL — ABNORMAL HIGH (ref 1.7–2.4)

## 2018-12-18 LAB — PROTIME-INR
INR: 2 — ABNORMAL HIGH (ref 0.8–1.2)
Prothrombin Time: 22.5 seconds — ABNORMAL HIGH (ref 11.4–15.2)

## 2018-12-18 MED ORDER — PANTOPRAZOLE SODIUM 40 MG PO TBEC: 40.0000 mg | DELAYED_RELEASE_TABLET | Freq: Every day | ORAL | Status: DC

## 2018-12-18 MED ORDER — POLYETHYLENE GLYCOL 3350 17 G PO PACK: 17.0000 g | PACK | Freq: Every day | ORAL | Status: DC | PRN

## 2018-12-18 MED ORDER — WARFARIN SODIUM 2 MG PO TABS: 4.0000 mg | ORAL_TABLET | Freq: Once | ORAL | Status: DC

## 2018-12-18 MED ORDER — LEVETIRACETAM 250 MG PO TABS: 250.0000 mg | ORAL_TABLET | Freq: Two times a day (BID) | ORAL | 1 refills | Status: DC

## 2018-12-18 MED ORDER — METOPROLOL TARTRATE 25 MG PO TABS: 12.5000 mg | ORAL_TABLET | Freq: Two times a day (BID) | ORAL | 0 refills | Status: DC

## 2018-12-18 NOTE — Discharge Instructions (Signed)
Epilepsy Epilepsy is a condition in which a person has repeated seizures over time. A seizure is a sudden burst of abnormal electrical and chemical activity in the brain. Seizures can cause a change in attention, behavior, or the ability to remain awake and alert (altered mental status). Epilepsy increases a person's risk of falls, accidents, and injury. It can also lead to complications, including:  Depression.  Poor memory.  Sudden unexplained death in epilepsy (SUDEP). This complication is rare, and its cause is not known. Most people with epilepsy lead normal lives. What are the causes? This condition may be caused by:  A head injury.  An injury that happens at birth.  A high fever during childhood.  A stroke.  Bleeding that goes into or around the brain.  Certain medicines and drugs.  Having too little oxygen for a long period of time.  Abnormal brain development.  Certain infections, such as meningitis and encephalitis.  Brain tumors.  Conditions that are passed from parent to child (are hereditary). What are the signs or symptoms? Symptoms of a seizure vary greatly from person to person. They may include:  Convulsions.  Stiffening of the body.  Involuntary movements of the arms or legs.  Loss of consciousness.  Breathing problems.  Falling suddenly.  Confusion.  Head nodding.  Eye blinking or fluttering.  Lip smacking.  Drooling.  Rapid eye movements.  Grunting.  Loss of bladder control and bowel control.  Staring.  Unresponsiveness. Some people have symptoms right before a seizure happens (aura) and right after a seizure happens. Symptoms of an aura include:  Fear or anxiety.  Nausea.  Feeling like the room is spinning (vertigo).  A feeling of having seen or heard something before (dj vu).  Odd tastes or smells.  Changes in vision, such as seeing flashing lights or spots. Symptoms that follow a seizure  include:  Confusion.  Sleepiness.  Headache. How is this diagnosed? This condition is diagnosed based on:  Your symptoms.  Your medical history.  A physical exam.  A neurological exam. A neurological exam is similar to a physical exam. It involves checking your strength, reflexes, coordination, and sensations.  Tests, such as: ? A painless test that creates a diagram of your brain waves (electroencephalogram, orEEG). ? An MRI. ? A CT scan. ? A lumbar puncture, also called a spinal tap. ? Blood tests to check for signs of infection or abnormal blood chemistry. How is this treated? Treatment can control seizures. Some types of epilepsy will need lifelong treatment, and some types go away in time. Treatment for this condition may involve:  Taking medicines to control seizures.  Having a device called a vagus nerve stimulator implanted in the chest. The device sends electrical impulses to the vagus nerve and to the brain to prevent seizures. This treatment may be recommended if medicines do not help.  Brain surgery. There are several kinds of surgeries that may be done to stop seizures from happening or to reduce how often seizures happen.  Having regular blood tests. You may need to have blood tests regularly to check that you are getting the right amount of medicine. Once this condition has been diagnosed, it is important to begin treatment as soon as possible. For some people, epilepsy eventually goes away. Follow these instructions at home: Medicines  Take over-the-counter and prescription medicines only as told by your health care provider.  Avoid any substances that may prevent your medicine from working properly, such as alcohol. Activity  Get enough rest. Lack of sleep can make seizures more likely to occur.  Follow instructions from your health care provider about driving, swimming, and doing any other activities that would be dangerous if you had a seizure. ? If  you live in the [place] check with your local DMV (department of motor vehicles) to find out about local driving laws. Each state has specific rules about when you can legally return to driving. Educating others Teach friends and family what to do if you have a seizure. They should:  Lay you on the ground to prevent a fall.  Cushion your head and body.  Loosen any tight clothing around your neck.  Turn you on your side. If vomiting occurs, this helps keep your airway clear.  Stay with you until you recover.  Not hold you down. Holding you down will not stop the seizure.  Not put anything in your mouth.  Know whether or not you need emergency care.  General instructions  Avoid anything that has ever triggered a seizure for you.  Keep a seizure diary. Record what you remember about each seizure, especially anything that might have triggered the seizure.  Keep all follow-up visits as told by your health care provider. This is important. Contact a health care provider if:  Your seizure pattern changes.  You have symptoms of infection or another illness. This might increase your risk of having a seizure. Get help right away if:  You have: ? A seizure that does not stop after 5 minutes. ? Several seizures in a row without a complete recovery between seizures. ? A seizure that makes it harder to breathe. ? A seizure that is different from previous seizures. ? A seizure that leaves you unable to speak or use a part of your body.  You did not wake up immediately after a seizure. These symptoms may represent a serious problem that is an emergency. Do not wait to see if the symptoms will go away. Get medical help right away. Call your local emergency services (911 in the [place] Do not drive yourself to the hospital. Summary  Epilepsy is a condition in which a person has repeated seizures over time. Some types of epilepsy will need lifelong treatment, and some types go away in  time.  Seizures can cause many symptoms, from brief staring spells or involuntary movements of the arms or legs to convulsions with loss of consciousness.  Treatment is effective at controlling seizures. Take over-the-counter and prescription medicines only as told by your health care provider.  Follow instructions from your health care provider about driving, swimming, and doing any other activities that would be dangerous if you had a seizure.  Teach friends and family what to do if you have a seizure. This information is not intended to replace advice given to you by your health care provider. Make sure you discuss any questions you have with your health care provider. Document Released: 05/12/2005 Document Revised: 01/04/2018 Document Reviewed: 01/04/2018 Elsevier Patient Education  [place].  Levetiracetam tablets What is this medicine? LEVETIRACETAM (lee ve tye RA se tam) is an antiepileptic drug. It is used with other medicines to treat certain types of seizures. This medicine may be used for other purposes; ask your health care provider or pharmacist if you have questions. COMMON BRAND NAME(S): Keppra, Roweepra What should I tell my health care provider before I take this medicine? They need to know if you have any of these conditions:  kidney disease  suicidal thoughts, plans, or attempt; a previous suicide attempt by you or a family member  an unusual or allergic reaction to levetiracetam, other medicines, foods, dyes, or preservatives  pregnant or trying to get pregnant  breast-feeding How should I use this medicine? Take this medicine by mouth with a glass of water. Follow the directions on the prescription label. Swallow the tablets whole. Do not crush or chew this medicine. You may take this medicine with or without food. Take your doses at regular intervals. Do not take your medicine more often than directed. Do not stop taking this medicine or any of your  seizure medicines unless instructed by your doctor or health care professional. Stopping your medicine suddenly can increase your seizures or their severity. A special MedGuide will be given to you by the pharmacist with each prescription and refill. Be sure to read this information carefully each time. Contact your pediatrician or health care professional regarding the use of this medication in children. While this drug may be prescribed for children as young as 38 years of age for selected conditions, precautions do apply. Overdosage: If you think you have taken too much of this medicine contact a poison control center or emergency room at once. NOTE: This medicine is only for you. Do not share this medicine with others. What if I miss a dose? If you miss a dose, take it as soon as you can. If it is almost time for your next dose, take only that dose. Do not take double or extra doses. What may interact with this medicine? This medicine may interact with the following medications:  carbamazepine  colesevelam  probenecid  sevelamer This list may not describe all possible interactions. Give your health care provider a list of all the medicines, herbs, non-prescription drugs, or dietary supplements you use. Also tell them if you smoke, drink alcohol, or use illegal drugs. Some items may interact with your medicine. What should I watch for while using this medicine? Visit your doctor or health care provider for a regular check on your progress. Wear a medical identification bracelet or chain to say you have epilepsy, and carry a card that lists all your medications. This medicine may cause serious skin reactions. They can happen weeks to months after starting the medicine. Contact your health care provider right away if you notice fevers or flu-like symptoms with a rash. The rash may be red or purple and then turn into blisters or peeling of the skin. Or, you might notice a red rash with swelling of  the face, lips or lymph nodes in your neck or under your arms. It is important to take this medicine exactly as instructed by your health care provider. When first starting treatment, your dose may need to be adjusted. It may take weeks or months before your dose is stable. You should contact your doctor or health care provider if your seizures get worse or if you have any new types of seizures. You may get drowsy or dizzy. Do not drive, use machinery, or do anything that needs mental alertness until you know how this medicine affects you. Do not stand or sit up quickly, especially if you are an older patient. This reduces the risk of dizzy or fainting spells. Alcohol may interfere with the effect of this medicine. Avoid alcoholic drinks. The use of this medicine may increase the chance of suicidal thoughts or actions. Pay special attention to how you are responding while on this medicine. Any worsening  of mood, or thoughts of suicide or dying should be reported to your health care provider right away. Women who become pregnant while using this medicine may enroll in the [place] Pregnancy Registry by calling [phone]. This registry collects information about the safety of antiepileptic drug use during pregnancy. What side effects may I notice from receiving this medicine? Side effects that you should report to your doctor or health care professional as soon as possible:  allergic reactions like skin rash, itching or hives, swelling of the face, lips, or tongue  breathing problems  dark urine  general ill feeling or flu-like symptoms  problems with balance, talking, walking  rash, fever, and swollen lymph nodes  redness, blistering, peeling or loosening of the skin, including inside the mouth  unusually weak or tired  worsening of mood, thoughts or actions of suicide or dying  yellowing of the eyes or skin Side effects that usually do not require medical  attention (report to your doctor or health care professional if they continue or are bothersome):  diarrhea  dizzy, drowsy  headache  loss of appetite This list may not describe all possible side effects. Call your doctor for medical advice about side effects. You may report side effects to FDA at 1-800-FDA-1088. Where should I keep my medicine? Keep out of reach of children. Store at room temperature between 15 and 30 degrees C (59 and 86 degrees F). Throw away any unused medicine after the expiration date. NOTE: This sheet is a summary. It may not cover all possible information. If you have questions about this medicine, talk to your doctor, pharmacist, or health care provider.  2020 Elsevier/Gold Standard (2018-08-13 15:23:36)    Fall Prevention in the Home, Adult Falls can cause injuries. They can happen to people of all ages. There are many things you can do to make your home safe and to help prevent falls. Ask for help when making these changes, if needed. What actions can I take to prevent falls? General Instructions  Use good lighting in all rooms. Replace any light bulbs that burn out.  Turn on the lights when you go into a dark area. Use night-lights.  Keep items that you use often in easy-to-reach places. Lower the shelves around your home if necessary.  Set up your furniture so you have a clear path. Avoid moving your furniture around.  Do not have throw rugs and other things on the floor that can make you trip.  Avoid walking on wet floors.  If any of your floors are uneven, fix them.  Add color or contrast paint or tape to clearly mark and help you see: ? Any grab bars or handrails. ? First and last steps of stairways. ? Where the edge of each step is.  If you use a stepladder: ? Make sure that it is fully opened. Do not climb a closed stepladder. ? Make sure that both sides of the stepladder are locked into place. ? Ask someone to hold the stepladder for  you while you use it.  If there are any pets around you, be aware of where they are. What can I do in the bathroom?      Keep the floor dry. Clean up any water that spills onto the floor as soon as it happens.  Remove soap buildup in the tub or shower regularly.  Use non-skid mats or decals on the floor of the tub or shower.  Attach bath mats securely with double-sided,  non-slip rug tape.  If you need to sit down in the shower, use a plastic, non-slip stool.  Install grab bars by the toilet and in the tub and shower. Do not use towel bars as grab bars. What can I do in the bedroom?  Make sure that you have a light by your bed that is easy to reach.  Do not use any sheets or blankets that are too big for your bed. They should not hang down onto the floor.  Have a firm chair that has side arms. You can use this for support while you get dressed. What can I do in the kitchen?  Clean up any spills right away.  If you need to reach something above you, use a strong step stool that has a grab bar.  Keep electrical cords out of the way.  Do not use floor polish or wax that makes floors slippery. If you must use wax, use non-skid floor wax. What can I do with my stairs?  Do not leave any items on the stairs.  Make sure that you have a light switch at the top of the stairs and the bottom of the stairs. If you do not have them, ask someone to add them for you.  Make sure that there are handrails on both sides of the stairs, and use them. Fix handrails that are broken or loose. Make sure that handrails are as long as the stairways.  Install non-slip stair treads on all stairs in your home.  Avoid having throw rugs at the top or bottom of the stairs. If you do have throw rugs, attach them to the floor with carpet tape.  Choose a carpet that does not hide the edge of the steps on the stairway.  Check any carpeting to make sure that it is firmly attached to the stairs. Fix any  carpet that is loose or worn. What can I do on the outside of my home?  Use bright outdoor lighting.  Regularly fix the edges of walkways and driveways and fix any cracks.  Remove anything that might make you trip as you walk through a door, such as a raised step or threshold.  Trim any bushes or trees on the path to your home.  Regularly check to see if handrails are loose or broken. Make sure that both sides of any steps have handrails.  Install guardrails along the edges of any raised decks and porches.  Clear walking paths of anything that might make someone trip, such as tools or rocks.  Have any leaves, snow, or ice cleared regularly.  Use sand or salt on walking paths during winter.  Clean up any spills in your garage right away. This includes grease or oil spills. What other actions can I take?  Wear shoes that: ? Have a low heel. Do not wear high heels. ? Have rubber bottoms. ? Are comfortable and fit you well. ? Are closed at the toe. Do not wear open-toe sandals.  Use tools that help you move around (mobility aids) if they are needed. These include: ? Canes. ? Walkers. ? Scooters. ? Crutches.  Review your medicines with your doctor. Some medicines can make you feel dizzy. This can increase your chance of falling. Ask your doctor what other things you can do to help prevent falls. Where to find more information  Centers for Disease Control and Prevention, [employer]: https://garcia.biz/  [employer] on Aging: BrainJudge.co.uk Contact a doctor if:  You are afraid  of falling at home.  You feel weak, drowsy, or dizzy at home.  You fall at home. Summary  There are many simple things that you can do to make your home safe and to help prevent falls.  Ways to make your home safe include removing tripping hazards and installing grab bars in the bathroom.  Ask for help when making these changes in your home. This information is not intended to  replace advice given to you by your health care provider. Make sure you discuss any questions you have with your health care provider. Document Released: 03/08/2009 Document Revised: 09/02/2018 Document Reviewed: 12/25/2016 Elsevier Patient Education  2020 [place].   IMPORTANT INFORMATION: PAY CLOSE ATTENTION   PHYSICIAN DISCHARGE INSTRUCTIONS  Follow with Primary care provider  Celene Squibb, MD  and other consultants as instructed by your Hospitalist Physician  [place] IF SYMPTOMS COME BACK, WORSEN OR NEW PROBLEM DEVELOPS   Please note: You were cared for by a hospitalist during your hospital stay. Every effort will be made to forward records to your primary care provider.  You can request that your primary care provider send for your hospital records if they have not received them.  Once you are discharged, your primary care physician will handle any further medical issues. Please note that NO REFILLS for any discharge medications will be authorized once you are discharged, as it is imperative that you return to your primary care physician (or establish a relationship with a primary care physician if you do not have one) for your post hospital discharge needs so that they can reassess your need for medications and monitor your lab values.  Please get a complete blood count and chemistry panel checked by your Primary MD at your next visit, and again as instructed by your Primary MD.  Get Medicines reviewed and adjusted: Please take all your medications with you for your next visit with your Primary MD  Laboratory/radiological data: Please request your Primary MD to go over all hospital tests and procedure/radiological results at the follow up, please ask your primary care provider to get all Hospital records sent to his/her office.  In some cases, they will be blood work, cultures and biopsy results pending at the time of your discharge. Please  request that your primary care provider follow up on these results.  If you are diabetic, please bring your blood sugar readings with you to your follow up appointment with primary care.    Please call and make your follow up appointments as soon as possible.    Also Note the following: If you experience worsening of your admission symptoms, develop shortness of breath, life threatening emergency, suicidal or homicidal thoughts you must seek medical attention immediately by calling 911 or calling your MD immediately  if symptoms less severe.  You must read complete instructions/literature along with all the possible adverse reactions/side effects for all the Medicines you take and that have been prescribed to you. Take any new Medicines after you have completely understood and accpet all the possible adverse reactions/side effects.   Do not drive when taking Pain medications or sleeping medications (Benzodiazepines)  Do not take more than prescribed Pain, Sleep and Anxiety Medications. It is not advisable to combine anxiety,sleep and pain medications without talking with your primary care practitioner  Special Instructions: If you have smoked or chewed Tobacco  in the last 2 yrs please stop smoking, stop any regular Alcohol  and  or any Recreational drug use.  Wear Seat belts while driving.  Do not drive if taking any narcotic, mind altering or controlled substances or recreational drugs or alcohol.

## 2018-12-18 NOTE — Discharge Summary (Signed)
Physician Discharge Summary  [name] PQD:[id number] DOB: 07/31/1935 DOA: 12/17/2018  PCP: Celene Squibb, MD Neurologist: Merlene Laughter  Admit date: 12/17/2018 Discharge date: 12/18/2018  Admitted From: Home  Disposition: Home   Recommendations for Outpatient Follow-up:  1. Follow up with PCP in 1 weeks 2. Follow up with Dr. Merlene Laughter in 2 weeks 3. No driving or operating machinery until cleared by neurologist 4. Please follow up on the following pending results: 2D echocardiogram results  Home Health: PT, RN, SW  Discharge Condition: STABLE  CODE STATUS: FULL    Brief Hospitalization Summary: Please see all hospital notes, images, labs for full details of the hospitalization. HPI: [name] is a 83 y.o. female with no known history of CAD presented to ED with complaint of chest pain and describes her symptoms as chest heaviness which led to numbness and weakness in bilateral upper extremities.  She reported that the chest discomfort had resolved by the time she arrived.  She says it has been a recurrent problem.  She is not a smoker.  She does not have any history of coronary disease that she reports.  She describes that the heaviness with across the center of her chest and she had some tingling in both arms.  She had no shortness of breath.  She has no family history of heart disease.  She is status post kyphoplasty 3 months ago.  She does not take pain medications other than Tylenol.  ED course.  The patient's troponins have been unremarkable.  However there was mild changes in her troponin that prompted the ED doctor to request inpatient observation.  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  She denies vision changes. She woke up with strange sensation of her upper extremities feeling numb. She reports frequent TIAs. She has seen Dr. Jannifer Franklin of neurology in [place]  years ago.     The patient was admitted for observation and work-up of these strange episodes that she has been having which I believe required a neurology consultation.  She was seen by Dr. Merlene Laughter who felt that her episodes could be complex partial seizures given her remote history of severe head injury.  She was started on low-dose Keppra.  She had an EEG but it was unremarkable.  Her carotid Doppler studies did not reveal any hemodynamically significant stenosis.  Her brain imaging studies did not show any worrisome findings.  Her echocardiogram is pending at time of discharge and would want her PCP to follow-up on the final results of the 2D echocardiogram.  The patient is followed closely by Dr. Nevada Crane.  I asked for her to please follow-up in 1 week with PCP.  She verbalized understanding.  The patient was seen by PT and they have recommended home health PT.  I have made arrangements for home health PT RN and social work.  The patient would like to follow-up with Dr. Merlene Laughter because he is in [place] and she has difficulty with travel as she is unable to drive.  The patient was advised to avoid driving and machinery operation until cleared by neurology.  The patient verbalized understanding.  Of note, it is unclear exactly why the patient is fully anticoagulated.  She is on warfarin and has been on it for years.  I will defer to her primary care provider who is been managing this.  Also her  LDL was elevated at 131.  She is only taking Zetia.  She is reporting that she was unable to tolerate statins.  I will defer to her primary care provider regarding this.  She did not want to be started on a statin at this time.  The patient was noted to have some brief episodes of sinus bradycardia with heart rate dropped into the 40s  She is on metoprolol 25 mg twice daily.  I have recommended reducing metoprolol to 12.5 mg twice daily where she would take a half tablet twice daily of her remaining 25 mg  tablets.  Discharge Diagnoses:  Principal Problem:   Chest pain Active Problems:   Chronic anticoagulation   Chronic diastolic CHF (congestive heart failure) (HCC)   Acquired hypothyroidism   Essential hypertension   H/O: CVA (cerebrovascular accident)   Depression, recurrent (HCC)   Paresthesia   Chronic midline thoracic back pain   Discharge Instructions: Discharge Instructions    Call MD for:  difficulty breathing, headache or visual disturbances   Complete by: As directed    Call MD for:  extreme fatigue   Complete by: As directed    Call MD for:  persistant dizziness or light-headedness   Complete by: As directed    Call MD for:  persistant nausea and vomiting   Complete by: As directed    Call MD for:  severe uncontrolled pain   Complete by: As directed    Increase activity slowly   Complete by: As directed      Allergies as of 12/18/2018      Reactions   Epinephrine Anaphylaxis   Demerol  [meperidine Hcl]    Demerol [meperidine] Other (See Comments)   headaches   Vicodin [hydrocodone-acetaminophen] Other (See Comments)   [name] it makes her crazy. Pt tolerates acetaminophen      Medication List    TAKE these medications   albuterol 108 (90 Base) MCG/ACT inhaler Commonly known as: VENTOLIN HFA Inhale 1-2 puffs into the lungs every 6 (six) hours as needed for wheezing or shortness of breath.   albuterol (2.5 MG/3ML) 0.083% nebulizer solution Commonly known as: PROVENTIL Take 3 mLs (2.5 mg total) by nebulization every 6 (six) hours as needed for wheezing or shortness of breath.   amLODipine 5 MG tablet Commonly known as: NORVASC Take 1 tablet (5 mg total) by mouth daily.   Biotin 300 MCG Tabs Take 1 tablet by mouth daily after supper.   buPROPion 150 MG 24 hr tablet Commonly known as: WELLBUTRIN XL Take 1 tablet (150 mg total) by mouth daily.   Calcium Carbonate 500 MG Chew Chew 1 tablet by mouth daily.   cholecalciferol 1000 units tablet Commonly  known as: VITAMIN D Take 1,000 Units by mouth daily.   cyanocobalamin 100 MCG tablet Take 1 tablet by mouth daily.   ezetimibe 10 MG tablet Commonly known as: ZETIA Take 1 tablet (10 mg total) by mouth daily.   Fish Oil 1200 MG Caps Take 1,200 mg by mouth daily.   Flaxseed Oil 1000 MG Caps Take 1 capsule by mouth.   furosemide 40 MG tablet Commonly known as: LASIX Take 1 tablet (40 mg total) by mouth every morning.   Iron 240 (27 Fe) MG Tabs Take 1 tablet by mouth daily.   Lecithin 1200 MG Caps Take 1 capsule by mouth daily.   levETIRAcetam 250 MG tablet Commonly known as: KEPPRA Take 1 tablet (250 mg total) by mouth 2 (two) times daily.  levothyroxine 50 MCG tablet Commonly known as: SYNTHROID Take 1 tablet (50 mcg total) by mouth daily.   Lutein 6 MG Caps Take 1 capsule by mouth daily.   Magnesium 250 MG Tabs Take 1 tablet by mouth daily.   metoprolol tartrate 25 MG tablet Commonly known as: LOPRESSOR Take 0.5 tablets (12.5 mg total) by mouth 2 (two) times daily. Take with food / meals What changed: how much to take   NON FORMULARY Diet Type:  NAS   pantoprazole 40 MG tablet Commonly known as: PROTONIX Take 1 tablet (40 mg total) by mouth daily.   polyethylene glycol 17 g packet Commonly known as: MIRALAX / GLYCOLAX Take 17 g by mouth daily as needed.   potassium chloride 10 MEQ tablet Commonly known as: K-DUR Take 1 tablet (10 mEq total) by mouth 2 (two) times daily.   Premarin vaginal cream Generic drug: conjugated estrogens Apply 1 application topically daily as needed.   Turmeric 500 MG Tabs Take by mouth daily.   Vitamin B Complex Tabs Take 1 tablet by mouth daily.   vitamin C 1000 MG tablet Take 1,000 mg by mouth daily.   warfarin 4 MG tablet Commonly known as: COUMADIN Take 1 tablet (4 mg total) by mouth daily.   Zinc 50 MG Tabs Take 1 tablet by mouth daily.      Follow-up Information    Celene Squibb, MD. Schedule an  appointment as soon as possible for a visit in 1 week(s).   Specialty: Internal Medicine Why: Hospital Follow Up  Contact information: [place] [facility] [address] [phone]        Phillips Odor, MD. Schedule an appointment as soon as possible for a visit in 2 week(s).   Specialty: Neurology Why: Hospital Follow Up  Contact information: [address] [place] [address] [phone]          Allergies  Allergen Reactions  . Epinephrine Anaphylaxis  . Demerol  [Meperidine Hcl]   . Demerol [Meperidine] Other (See Comments)    headaches  . Vicodin [Hydrocodone-Acetaminophen] Other (See Comments)    [name] it makes her crazy. Pt tolerates acetaminophen   Allergies as of 12/18/2018      Reactions   Epinephrine Anaphylaxis   Demerol  [meperidine Hcl]    Demerol [meperidine] Other (See Comments)   headaches   Vicodin [hydrocodone-acetaminophen] Other (See Comments)   [name] it makes her crazy. Pt tolerates acetaminophen      Medication List    TAKE these medications   albuterol 108 (90 Base) MCG/ACT inhaler Commonly known as: VENTOLIN HFA Inhale 1-2 puffs into the lungs every 6 (six) hours as needed for wheezing or shortness of breath.   albuterol (2.5 MG/3ML) 0.083% nebulizer solution Commonly known as: PROVENTIL Take 3 mLs (2.5 mg total) by nebulization every 6 (six) hours as needed for wheezing or shortness of breath.   amLODipine 5 MG tablet Commonly known as: NORVASC Take 1 tablet (5 mg total) by mouth daily.   Biotin 300 MCG Tabs Take 1 tablet by mouth daily after supper.   buPROPion 150 MG 24 hr tablet Commonly known as: WELLBUTRIN XL Take 1 tablet (150 mg total) by mouth daily.   Calcium Carbonate 500 MG Chew Chew 1 tablet by mouth daily.   cholecalciferol 1000 units tablet Commonly known as: VITAMIN D Take 1,000 Units by mouth daily.   cyanocobalamin 100 MCG tablet Take 1 tablet by mouth daily.   ezetimibe 10 MG  tablet Commonly known as: ZETIA Take 1 tablet (10 mg total) by mouth daily.   Fish Oil 1200 MG Caps Take 1,200 mg by mouth daily.   Flaxseed Oil 1000 MG Caps Take 1 capsule by mouth.   furosemide 40 MG tablet Commonly known as: LASIX Take 1 tablet (40 mg total) by mouth every morning.   Iron 240 (27 Fe) MG Tabs Take 1 tablet by mouth daily.   Lecithin 1200 MG Caps Take 1 capsule by mouth daily.   levETIRAcetam 250 MG tablet Commonly known as: KEPPRA Take 1 tablet (250 mg total) by mouth 2 (two) times daily.   levothyroxine 50 MCG tablet Commonly known as: SYNTHROID Take 1 tablet (50 mcg total) by mouth daily.   Lutein 6 MG Caps Take 1 capsule by mouth daily.   Magnesium 250 MG Tabs Take 1 tablet by mouth daily.   metoprolol tartrate 25 MG tablet Commonly known as: LOPRESSOR Take 0.5 tablets (12.5 mg total) by mouth 2 (two) times daily. Take with food / meals What changed: how much to take   NON FORMULARY Diet Type:  NAS   pantoprazole 40 MG tablet Commonly known as: PROTONIX Take 1 tablet (40 mg total) by mouth daily.   polyethylene glycol 17 g packet Commonly known as: MIRALAX / GLYCOLAX Take 17 g by mouth daily as needed.   potassium chloride 10 MEQ tablet Commonly known as: K-DUR Take 1 tablet (10 mEq total) by mouth 2 (two) times daily.   Premarin vaginal cream Generic drug: conjugated estrogens Apply 1 application topically daily as needed.   Turmeric 500 MG Tabs Take by mouth daily.   Vitamin B Complex Tabs Take 1 tablet by mouth daily.   vitamin C 1000 MG tablet Take 1,000 mg by mouth daily.   warfarin 4 MG tablet Commonly known as: COUMADIN Take 1 tablet (4 mg total) by mouth daily.   Zinc 50 MG Tabs Take 1 tablet by mouth daily.      Procedures/Studies:  EEG IMPRESSION: This study is within normal limits. No seizures or epileptiform discharges were seen throughout the recording.  Dg Chest 2 View  Result Date:  12/17/2018 CLINICAL DATA:  Chest pain. Weakness. EXAM: CHEST - 2 VIEW COMPARISON:  06/27/2013 FINDINGS: The cardiomediastinal contours are normal. Aortic atherosclerosis. The lungs are clear. Pulmonary vasculature is normal. No consolidation, pleural effusion, or pneumothorax. No acute osseous abnormalities are seen. Chronic T12 compression fracture with vertebroplasty. Peripherally calcified breast implants. IMPRESSION: No acute chest findings. Electronically Signed   By: Keith Rake M.D.   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 She denies vision changes. She woke up with strange sensation of UPPER extremity numbness. EXAM: CT HEAD WITHOUT CONTRAST TECHNIQUE: Contiguous axial images were obtained from the base of the skull through the vertex without intravenous contrast. COMPARISON:  01/03/2009 CT, 07/28/2011 MRI FINDINGS: Brain: There is central and cortical atrophy. Periventricular white matter changes are consistent with small vessel disease. There is encephalomalacia in the RIGHT frontal lobe. There is no intra or extra-axial fluid collection. The basilar cisterns and ventricles have a normal appearance. There is no CT evidence for acute infarction or hemorrhage. The 1 centimeter calcification is identified in the LEFT frontal region, adjacent to the calvarium and  consistent with meningeal calcification or small meningioma. There is no mass effect. Vascular: There is atherosclerotic calcification of the internal carotid arteries. No hyperdense vessels. Skull: Normal. Negative for fracture or focal lesion. Sinuses/Orbits: No acute finding. Other: None IMPRESSION: 1. Atrophy and small vessel disease. 2. Encephalomalacia of the RIGHT frontal lobe. 3. No evidence for acute intracranial abnormality.  Electronically Signed   By: Nolon Nations M.D.   On: 12/17/2018 15:51   US Carotid Bilateral  Result Date: 12/17/2018 CLINICAL DATA:  Chest pain, numbness and weakness bilateral upper extremity, TIA. Coronary artery disease. EXAM: BILATERAL CAROTID DUPLEX ULTRASOUND TECHNIQUE: Pearline Cables scale imaging, color Doppler and duplex ultrasound were performed of bilateral carotid and vertebral arteries in the neck. COMPARISON:  None. FINDINGS: Criteria: Quantification of carotid stenosis is based on velocity parameters that correlate the residual internal carotid diameter with NASCET-based stenosis levels, using the diameter of the distal internal carotid lumen as the denominator for stenosis measurement. The following velocity measurements were obtained: RIGHT ICA: 65/15 cm/sec CCA: 77/8 cm/sec SYSTOLIC ICA/CCA RATIO:  1.5 ECA: 53 cm/sec LEFT ICA: 90/18 cm/sec CCA: 24/2 cm/sec SYSTOLIC ICA/CCA RATIO:  1.8 ECA: 63 cm/sec RIGHT CAROTID ARTERY: Mild tortuosity. Eccentric partially calcified plaque in the bulb and proximal ECA, extending to the ICA origin. No high-grade stenosis. Normal waveforms and color Doppler signal. RIGHT VERTEBRAL ARTERY:  Normal flow direction and waveform. LEFT CAROTID ARTERY: Mild plaque in the distal common carotid artery. Mild calcified plaque in the bulb and ICA origin. No high-grade stenosis. Normal waveforms and color Doppler signal. LEFT VERTEBRAL ARTERY:  Normal flow direction and waveform. IMPRESSION: 1. Bilateral carotid bifurcation plaque resulting in less than 50% diameter ICA stenosis. 2. Antegrade bilateral vertebral arterial flow. Electronically Signed   By: Lucrezia Europe M.D.   On: 12/17/2018 15:40   Dg Bone Density  Result Date: 12/09/2018 EXAM: DUAL X-RAY ABSORPTIOMETRY (DXA) FOR BONE MINERAL DENSITY IMPRESSION: Ordering Physician:  Dr. Celene Squibb, Your patient Melaysia Streed completed a BMD test on 12/09/2018 using the [place] (software version: 14.10)  manufactured by [employer]. The following summarizes the results of our evaluation. Technologist:[facility] PATIENT BIOGRAPHICAL: Name: ENVI, EAGLESON Patient ID: [id number] Birth Date: 19-Aug-1935 Height: 61.0 in. Gender: Female Exam Date: 12/09/2018 Weight: 130.0 lbs. Indications: Advanced Age, Back surgery, Bilateral Oophrectomy, Caucasian, Early Menopause, Follow up Osteopenia, Height Loss, History of Fracture (Adult), Post Menopausal, Secondary Osteoporosis Fractures: Foot, Ribs, Toe, Wrist Treatments: Calcium, Synthroid, Vitamin D DENSITOMETRY RESULTS: Site         Region     Measured Date Measured Age WHO Classification Young Adult T-score BMD         %Change vs. Previous Significant Change (*) DualFemur Total Left 12/09/2018 81.4 Osteopenia -2.3 0.714 g/cm2 -17.3% Yes DualFemur Total Left 03/23/2013 75.7 Osteopenia -1.1 0.863 g/cm2 -2.2% - DualFemur Total Left 04/02/2004 66.7 Normal -1.0 0.882 g/cm2 - - DualFemur Total Mean 12/09/2018 81.4 Osteopenia -2.2 0.727 g/cm2 -13.8% Yes DualFemur Total Mean 03/23/2013 75.7 - - 0.843 g/cm2 -2.5% Yes DualFemur Total Mean 04/02/2004 66.7 - - 0.865 g/cm2 - - Left Forearm Radius 33% 12/09/2018 81.4 Osteopenia -1.4 0.616 g/cm2 - - ASSESSMENT: The BMD measured at Femur Total Left is 0.714 g/cm2 with a T-score of -2.3. This patient is considered osteopenic according to [facility]) criteria. The scan quality is good. Lumbar spine was excluded due to advanced degenerative changes and surgery. Compared with the prior study on 03/23/2013, the BMD of the  total mean shows a statistically significant decrease. World Pharmacologist [facility]) criteria for post-menopausal, Caucasian Women: Normal:       T-score at or above -1 SD Osteopenia:   T-score between -1 and -2.5 SD Osteoporosis: T-score at or below -2.5 SD RECOMMENDATIONS: 1. All patients should optimize calcium and vitamin D intake. 2. Consider FDA-approved medical therapies in postmenopausal  women and med aged 83 years and older, based on the following: a. A hip or vertebral (clinical or morphometric) fracture b. T-score< -2.5 at the femoral neck or spine after appropriate evaluation to exclude secondary causes c. Low bone mass (T-score between -1.0 and -2.5 at the femoral neck or spine) and a 10-year probability of a hip fracture > 3% or a 10-year probability of a major osteoporosis-related fracture > 20% based on the [place]-adapted WHO algorithm d. Clinician judgment and/or patient preferences may indicate treatment for people with 10-year fracture probabilities above or below these levels FOLLOW-UP: People with diagnosed cases of osteoporosis or at high risk for fracture should have regular bone mineral density tests. For patients eligible for Medicare, routine testing is allowed once every 2 years. The testing frequency can be increased to one year for patients who have rapidly progressing disease, those who are receiving or discontinuing medical therapy to restore bone mass, or have additional risk factors. I have reviewed this report, and agree with the above findings. [facility] Your patient NAVAYAH SOK completed a FRAX assessment on 12/09/2018 using the [place] (analysis version: 14.10) manufactured by [employer]. The following summarizes the results of our evaluation. PATIENT BIOGRAPHICAL: Name: KENYATA, NAPIER Patient ID: [id number] Birth Date: 05/17/1936 Height:    61.0 in. Gender:     [age]    [age]       Weight:    130.0 lbs. Ethnicity:  White                            Exam Date: 12/09/2018 FRAX* RESULTS:  (version: 3.5) 10-year Probability of Fracture1 Major Osteoporotic Fracture2 Hip Fracture 24.3% 7.5% Population: [place] (Caucasian) Risk Factors: History of Fracture (Adult), Secondary Osteoporosis Based on Femur (Left) Neck BMD 1 -The 10-year probability of fracture may be lower than reported if the patient has received treatment. 2  -Major Osteoporotic Fracture: Clinical Spine, Forearm, Hip or Shoulder *FRAX is a Materials engineer of the [employer] for Metabolic Bone Disease, a [place] (WHO) [facility]. ASSESSMENT: The probability of a major osteoporotic fracture is 24.3% within the next ten years. The probability of a hip fracture is 7.5% within the next ten years. Electronically Signed   By: Lowella Grip III M.D.   On: 12/09/2018 12:18     Subjective: Patient says she is feeling much better she has not had any recurrence of her symptoms.  Discharge Exam: Vitals:   12/18/18 0000 12/18/18 0400  BP: 120/78 129/67  Pulse: (!) 58 (!) 43  Resp: 18 18  Temp: 98.6 F (37 C) 97.6 F (36.4 C)  SpO2:  100%   Vitals:   12/17/18 2000 12/17/18 2134 12/18/18 0000 12/18/18 0400  BP: 116/78 115/75 120/78 129/67  Pulse: (!) 55 (!) 53 (!) 58 (!) 43  Resp: 18 19 18 18   Temp: 98.5 F (36.9 C) 98.7 F (37.1 C) 98.6 F (37 C) 97.6 F (36.4 C)  TempSrc: Oral Oral Oral  Oral  SpO2:  97%  100%  Weight:      Height:        General: Pt is alert, awake, not in acute distress Cardiovascular: brady,normal S1/S2 +, no rubs, no gallops Respiratory: CTA bilaterally, no wheezing, no rhonchi Abdominal: Soft, NT, ND, bowel sounds + Extremities: no edema, no cyanosis Neurological: nonfocal exam.    The results of significant diagnostics from this hospitalization (including imaging, microbiology, ancillary and laboratory) are listed below for reference.     Microbiology: Recent Results (from the past 240 hour(s))  SARS Coronavirus 2 (CEPHEID - Performed in [place] hospital lab), Hosp Order     Status: None   Collection Time: 12/17/18  6:12 AM   Specimen: Nasopharyngeal Swab  Result Value Ref Range Status   SARS Coronavirus 2 NEGATIVE NEGATIVE Final    Comment: (NOTE) If result is NEGATIVE SARS-CoV-2 target nucleic acids are NOT DETECTED. The SARS-CoV-2 RNA is  generally detectable in upper and lower  respiratory specimens during the acute phase of infection. The lowest  concentration of SARS-CoV-2 viral copies this assay can detect is 250  copies / mL. A negative result does not preclude SARS-CoV-2 infection  and should not be used as the sole basis for treatment or other  patient management decisions.  A negative result may occur with  improper specimen collection / handling, submission of specimen other  than nasopharyngeal swab, presence of viral mutation(s) within the  areas targeted by this assay, and inadequate number of viral copies  (<250 copies / mL). A negative result must be combined with clinical  observations, patient history, and epidemiological information. If result is POSITIVE SARS-CoV-2 target nucleic acids are DETECTED. The SARS-CoV-2 RNA is generally detectable in upper and lower  respiratory specimens dur ing the acute phase of infection.  Positive  results are indicative of active infection with SARS-CoV-2.  Clinical  correlation with patient history and other diagnostic information is  necessary to determine patient infection status.  Positive results do  not rule out bacterial infection or co-infection with other viruses. If result is PRESUMPTIVE POSTIVE SARS-CoV-2 nucleic acids MAY BE PRESENT.   A presumptive positive result was obtained on the submitted specimen  and confirmed on repeat testing.  While 2019 novel coronavirus  (SARS-CoV-2) nucleic acids may be present in the submitted sample  additional confirmatory testing may be necessary for epidemiological  and / or clinical management purposes  to differentiate between  SARS-CoV-2 and other Sarbecovirus currently known to infect humans.  If clinically indicated additional testing with an alternate test  methodology [phone]) is advised. The SARS-CoV-2 RNA is generally  detectable in upper and lower respiratory sp ecimens during the acute  phase of  infection. The expected result is Negative. Fact Sheet for Patients:  StrictlyIdeas.no Fact Sheet for Healthcare Providers: BankingDealers.co.za This test is not yet approved or cleared by the [place] FDA and has been authorized for detection and/or diagnosis of SARS-CoV-2 by FDA under an Emergency Use Authorization (EUA).  This EUA will remain in effect (meaning this test can be used) for the duration of the COVID-19 declaration under Section 564(b)(1) of the Act, 21 U.S.C. section 360bbb-3(b)(1), unless the authorization is terminated or revoked sooner. Performed at [facility], [address] [place], [place] [address]      Labs: BNP (last 3 results) No results for input(s): BNP in the last 8760 hours. Basic Metabolic Panel: Recent Labs  Lab 12/17/18 0240 12/17/18 1356 12/18/18 0533  NA  141  --  141  K 3.0*  --  4.5  CL 103  --  109  CO2 28  --  25  GLUCOSE 102*  --  82  BUN 20  --  24*  CREATININE 1.21* 1.10* 1.05*  CALCIUM 9.3  --  8.8*  MG  --  2.5* 2.7*   Liver Function Tests: No results for input(s): AST, ALT, ALKPHOS, BILITOT, PROT, ALBUMIN in the last 168 hours. No results for input(s): LIPASE, AMYLASE in the last 168 hours. No results for input(s): AMMONIA in the last 168 hours. CBC: Recent Labs  Lab 12/17/18 0240 12/17/18 1356  WBC 6.5 6.4  NEUTROABS 3.3  --   HGB 14.0 12.9  HCT 43.6 40.7  MCV 90.6 92.3  PLT 297 271   Cardiac Enzymes: No results for input(s): CKTOTAL, CKMB, CKMBINDEX, TROPONINI in the last 168 hours. BNP: Invalid input(s): POCBNP CBG: No results for input(s): GLUCAP in the last 168 hours. D-Dimer No results for input(s): DDIMER in the last 72 hours. Hgb A1c No results for input(s): HGBA1C in the last 72 hours. Lipid Profile Recent Labs    12/17/18 1356  CHOL 220*  HDL 60  LDLCALC 131*  TRIG 146  CHOLHDL 3.7   Thyroid function studies No results for input(s): TSH,  T4TOTAL, T3FREE, THYROIDAB in the last 72 hours.  Invalid input(s): FREET3 Anemia work up No results for input(s): VITAMINB12, FOLATE, FERRITIN, TIBC, IRON, RETICCTPCT in the last 72 hours. Urinalysis    Component Value Date/Time   COLORURINE STRAW (A) 08/18/2018 1139   APPEARANCEUR CLEAR 08/18/2018 1139   LABSPEC 1.005 08/18/2018 1139   PHURINE 8.0 08/18/2018 1139   GLUCOSEU NEGATIVE 08/18/2018 1139   HGBUR NEGATIVE 08/18/2018 1139   BILIRUBINUR NEGATIVE 08/18/2018 1139   KETONESUR NEGATIVE 08/18/2018 1139   PROTEINUR NEGATIVE 08/18/2018 1139   NITRITE NEGATIVE 08/18/2018 1139   LEUKOCYTESUR TRACE (A) 08/18/2018 1139   Sepsis Labs Invalid input(s): PROCALCITONIN,  WBC,  LACTICIDVEN Microbiology Recent Results (from the past 240 hour(s))  SARS Coronavirus 2 (CEPHEID - Performed in [facility] hospital lab), Hosp Order     Status: None   Collection Time: 12/17/18  6:12 AM   Specimen: Nasopharyngeal Swab  Result Value Ref Range Status   SARS Coronavirus 2 NEGATIVE NEGATIVE Final    Comment: (NOTE) If result is NEGATIVE SARS-CoV-2 target nucleic acids are NOT DETECTED. The SARS-CoV-2 RNA is generally detectable in upper and lower  respiratory specimens during the acute phase of infection. The lowest  concentration of SARS-CoV-2 viral copies this assay can detect is 250  copies / mL. A negative result does not preclude SARS-CoV-2 infection  and should not be used as the sole basis for treatment or other  patient management decisions.  A negative result may occur with  improper specimen collection / handling, submission of specimen other  than nasopharyngeal swab, presence of viral mutation(s) within the  areas targeted by this assay, and inadequate number of viral copies  (<250 copies / mL). A negative result must be combined with clinical  observations, patient history, and epidemiological information. If result is POSITIVE SARS-CoV-2 target nucleic acids are DETECTED. The  SARS-CoV-2 RNA is generally detectable in upper and lower  respiratory specimens dur ing the acute phase of infection.  Positive  results are indicative of active infection with SARS-CoV-2.  Clinical  correlation with patient history and other diagnostic information is  necessary to determine patient infection status.  Positive results do  not rule out bacterial infection or co-infection with other viruses. If result is PRESUMPTIVE POSTIVE SARS-CoV-2 nucleic acids MAY BE PRESENT.   A presumptive positive result was obtained on the submitted specimen  and confirmed on repeat testing.  While 2019 novel coronavirus  (SARS-CoV-2) nucleic acids may be present in the submitted sample  additional confirmatory testing may be necessary for epidemiological  and / or clinical management purposes  to differentiate between  SARS-CoV-2 and other Sarbecovirus currently known to infect humans.  If clinically indicated additional testing with an alternate test  methodology [phone]) is advised. The SARS-CoV-2 RNA is generally  detectable in upper and lower respiratory sp ecimens during the acute  phase of infection. The expected result is Negative. Fact Sheet for Patients:  StrictlyIdeas.no Fact Sheet for Healthcare Providers: BankingDealers.co.za This test is not yet approved or cleared by the [place] FDA and has been authorized for detection and/or diagnosis of SARS-CoV-2 by FDA under an Emergency Use Authorization (EUA).  This EUA will remain in effect (meaning this test can be used) for the duration of the COVID-19 declaration under Section 564(b)(1) of the Act, 21 U.S.C. section 360bbb-3(b)(1), unless the authorization is terminated or revoked sooner. Performed at [facility], [address] [place],  [address]     Time coordinating discharge:   SIGNED:  Irwin Brakeman, MD  [facility] 12/18/2018, 2:28 PM How to contact  the [facility] Attending or Consulting provider [place] or covering provider during after hours [place], for this patient?  1. Check the care team in [facility] and look for a) attending/consulting [facility] provider listed and b) the [facility] team listed 2. Log into [email or web address] and use [facility]'s universal password to access. If you do not have the password, please contact the hospital operator. 3. Locate the [facility] provider you are looking for under [facility] and page to a number that you can be directly reached. 4. If you still have difficulty reaching the provider, please page the [facility] (Director on Call) for the Hospitalists listed on amion for assistance.

## 2018-12-18 NOTE — Plan of Care (Signed)

## 2018-12-18 NOTE — Progress Notes (Signed)
*  PRELIMINARY RESULTS* Echocardiogram 2D Echocardiogram has been performed.  Leavy Cella 12/18/2018, 10:41 AM

## 2018-12-18 NOTE — Evaluation (Signed)
Physical Therapy Evaluation Patient Details Name: [name] MRN: [id number] DOB: 1936-01-26 Today's Date: 12/18/2018   History of Present Illness  [name] is a 83 y.o. female with no known history of CAD presented to ED with complaint of chest pain and describes her symptoms as chest heaviness which led to numbness and weakness in bilateral upper extremities.  She reported that the chest discomfort had resolved by the time she arrived.  She says it has been a recurrent problem.  She is not a smoker.  She does not have any history of coronary disease that she reports.  She describes that the heaviness with across the center of her chest and she had some tingling in both arms.  She had no shortness of breath.  She has no family history of heart disease.  She is status post kyphoplasty 3 months ago.  She does not take pain medications other than Tylenol.  ED course.  The patient's troponins have been unremarkable.  However there was mild changes in her troponin that prompted the ED doctor to request inpatient observation.  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  She denies vision changes. She woke up with strange sensation of her upper extremities feeling numb. She reports frequent TIAs. She has seen Dr. Jannifer Franklin of neurology in [place] years ago.    Clinical Impression  [name] is a 83 y.o. female admitted for chest discomfort with above HPI. At baseline pt reports she is independent with mobility and uses a SPC in the community. She is currently not driving and relies on her brother and friends for assistance with transportation. This date [name] required supervision for transfers and min guard for gait with SPC. She experienced Rt knee buckling and was able to prevent LOB with use of cane. Balance impairments became more evident as patient fatigued and she  complained of Rt posterior hip pain with ambulation. Patient was educated on concerns with balance impairments and weakness due to decreased activity since back surgery and the benefits of home health therapy to reduce her fall risk. She would benefit from HHPT and intermitted supervision to be able to safely return home, pt is unsure who could check in on her as her brother is disabled and she does not have friends close by. Acute PT will follow during her stay to address impairments and progress her independence with mobility.     Follow Up Recommendations Home health PT;Supervision - Intermittent    Equipment Recommendations  None recommended by PT    Recommendations for Other Services       Precautions / Restrictions Precautions Precautions: Fall Restrictions Weight Bearing Restrictions: No      Mobility  Bed Mobility Overal bed mobility: Modified Independent      General bed mobility comments: HOB elevated and bed rails used to sit up  Transfers Overall transfer level: Needs assistance   Transfers: Sit to/from Stand Sit to Stand: Supervision    General transfer comment: pt slightly unsteady with sit to stand holding onto bed rail for support, no physical assist needed  Ambulation/Gait Ambulation/Gait assistance: Supervision;Min guard Gait Distance (Feet): 100 Feet Assistive device: Straight cane Gait Pattern/deviations: Step-through pattern;Decreased stride length;Decreased step length - left;Decreased step length - right;Shuffle;Staggering right;Drifts right/left;Narrow base of support Gait velocity: slow and unsteady   General Gait Details: pt with Rt knee  buckling several times during last 30 feet of gait. pt drifting towards Rt and Rt ankle supinating with hip internally rotated in swing through phase      Balance Overall balance assessment: Needs assistance   Sitting balance-Leahy Scale: Good     Standing balance support: Single extremity supported;During  functional activity Standing balance-Leahy Scale: Fair Standing balance comment: pt requires singel UE support during dynamic standing and unable to hold balance with challenge/weight shift         Pertinent Vitals/Pain Pain Assessment: Faces Faces Pain Scale: Hurts little more Pain Location: Low back and Rt posterior hip Pain Descriptors / Indicators: Aching Pain Intervention(s): Limited activity within patient's tolerance;Monitored during session    Home Living Family/patient expects to be discharged to:: Private residence Living Arrangements: Alone Available Help at Discharge: Other (Comment)(pt does not have help at home, her brother is disabled, she has a friend but they live in [place] and are not readily available) Type of Home: Apartment Home Access: Level entry     Home Layout: One level Home Equipment: Cane - single point;Grab bars - tub/shower;Walker - 2 wheels;Shower seat;Wheelchair - manual Additional Comments: pt reports she is unsteady and wobbles around her home using wall and furniture to keep her balance, she uses the [facility] in the community    Prior Function Level of Independence: Independent with assistive device(s)         Comments: household and short distanced community ambulator with [facility]     Hand Dominance   Dominant Hand: Right    Extremity/Trunk Assessment   Upper Extremity Assessment Upper Extremity Assessment: Overall WFL for tasks assessed    Lower Extremity Assessment Lower Extremity Assessment: Generalized weakness;RLE deficits/detail RLE Deficits / Details: Rt LE buckling multiple times during ambulation as pt became more fatigued    Cervical / Trunk Assessment Cervical / Trunk Assessment: Kyphotic  Communication   Communication: HOH  Cognition Arousal/Alertness: Awake/alert Behavior During Therapy: WFL for tasks assessed/performed Overall Cognitive Status: Within Functional Limits for tasks assessed                 Assessment/Plan    PT Assessment Patient needs continued PT services  PT Problem List Decreased strength;Decreased balance;Pain;Decreased mobility;Decreased activity tolerance;Decreased safety awareness       PT Treatment Interventions DME instruction;Gait training;Therapeutic activities;Functional mobility training;Patient/family education;Balance training;Neuromuscular re-education;Manual techniques;Therapeutic exercise    PT Goals (Current goals can be found in the Care Plan section)  Acute Rehab PT Goals Patient Stated Goal: patient wants to return home at independent level PT Goal Formulation: With patient Time For Goal Achievement: 12/24/18 Potential to Achieve Goals: Good    Frequency Min 3X/week   Barriers to discharge Decreased caregiver support pt lives alone and has no on readily available to assist       AM-PAC PT "6 Clicks" Mobility  Outcome Measure Help needed turning from your back to your side while in a flat bed without using bedrails?: A Little Help needed moving from lying on your back to sitting on the side of a flat bed without using bedrails?: A Little Help needed moving to and from a bed to a chair (including a wheelchair)?: A Little Help needed standing up from a chair using your arms (e.g., wheelchair or bedside chair)?: A Little Help needed to walk in hospital room?: A Little Help needed climbing 3-5 steps with a railing? : A Little 6 Click Score: 18    End of Session Equipment Utilized During Treatment:  Gait belt Activity Tolerance: Patient tolerated treatment well Patient left: in bed;with nursing/sitter in room;with call bell/phone within reach Nurse Communication: Mobility status PT Visit Diagnosis: Unsteadiness on feet (R26.81);Other abnormalities of gait and mobility (R26.89);Muscle weakness (generalized) (M62.81);Difficulty in walking, not elsewhere classified (R26.2);Pain Pain - Right/Left: Right Pain - part of body: (lower back and hip)     Time: 0813-8871 PT Time Calculation (min) (ACUTE ONLY): 35 min   Charges:   PT Evaluation $PT Eval Low Complexity: 1 Low PT Treatments $Gait Training: 8-22 mins       Kipp Brood, PT, DPT, [facility] Physical Therapist with [facility]  12/18/2018 9:45 AM

## 2018-12-18 NOTE — Progress Notes (Signed)
Discharge instructions given with stated understanding.  Patient waiting for transportation home at this time 

## 2018-12-18 NOTE — Plan of Care (Signed)
  Problem: Acute Rehab PT Goals(only PT should resolve) Goal: Pt Will Go Supine/Side To Sit Outcome: Progressing Flowsheets (Taken 12/18/2018 0954) Pt will go Supine/Side to Sit: Independently Goal: Pt Will Go Sit To Supine/Side Outcome: Progressing Flowsheets (Taken 12/18/2018 0954) Pt will go Sit to Supine/Side: Independently Goal: Patient Will Transfer Sit To/From Stand Outcome: Progressing Flowsheets (Taken 12/18/2018 0954) Patient will transfer sit to/from stand: Independently Goal: Pt Will Transfer Bed To Chair/Chair To Bed Outcome: Progressing Flowsheets (Taken 12/18/2018 0954) Pt will Transfer Bed to Chair/Chair to Bed: Independently Goal: Pt Will Ambulate Outcome: Progressing Flowsheets (Taken 12/18/2018 0954) Pt will Ambulate:  100 feet  with modified independence  with least restrictive assistive device

## 2018-12-18 NOTE — Progress Notes (Signed)
ANTICOAGULATION CONSULT NOTE -   Pharmacy Consult for Coumadin Indication: atrial fibrillation  Allergies  Allergen Reactions  . Epinephrine Anaphylaxis  . Demerol  [Meperidine Hcl]   . Demerol [Meperidine] Other (See Comments)    headaches  . Vicodin [Hydrocodone-Acetaminophen] Other (See Comments)    [name] it makes her crazy. Pt tolerates acetaminophen    Patient Measurements: Height: 5' (152.4 cm) Weight: 130 lb 8.2 oz (59.2 kg) IBW/kg (Calculated) : 45.5  Vital Signs: Temp: 97.6 F (36.4 C) (07/25 0400) Temp Source: Oral (07/25 0400) BP: 129/67 (07/25 0400) Pulse Rate: 43 (07/25 0400)  Labs: Recent Labs    12/17/18 0240 12/17/18 0420 12/17/18 1356 12/17/18 1411 12/17/18 1904 12/18/18 0533  HGB 14.0  --  12.9  --   --   --   HCT 43.6  --  40.7  --   --   --   PLT 297  --  271  --   --   --   LABPROT  --   --   --  19.2*  --  22.5*  INR  --   --   --  1.6*  --  2.0*  CREATININE 1.21*  --  1.10*  --   --  1.05*  TROPONINIHS 10 17 35*  --  26*  --     Estimated Creatinine Clearance: 33.8 mL/min (A) (by C-G formula based on SCr of 1.05 mg/dL (H)).   Medical History: Past Medical History:  Diagnosis Date  . Adenomatous colon polyp 2008  . Asthma   . Complication of anesthesia   . CVA (cerebral vascular accident) ([place])    sl weaker on rt foot  . Diverticulosis   . GERD (gastroesophageal reflux disease)   . Hemorrhoids 2007  . HTN (hypertension)   . Hyperplastic colon polyp 2005  . Hypothyroidism   . Narcolepsy   . PONV (postoperative nausea and vomiting)   . TIA (transient ischemic attack)     Medications:  Medications Prior to Admission  Medication Sig Dispense Refill Last Dose  . albuterol (PROVENTIL) (2.5 MG/3ML) 0.083% nebulizer solution Take 3 mLs (2.5 mg total) by nebulization every 6 (six) hours as needed for wheezing or shortness of breath. 75 mL 0   . albuterol (VENTOLIN HFA) 108 (90 Base) MCG/ACT inhaler Inhale 1-2 puffs into the lungs  every 6 (six) hours as needed for wheezing or shortness of breath.     Marland Kitchen amLODipine (NORVASC) 5 MG tablet Take 1 tablet (5 mg total) by mouth daily. 30 tablet 0 12/16/2018 at Unknown time  . Ascorbic Acid (VITAMIN C) 1000 MG tablet Take 1,000 mg by mouth daily.   12/16/2018 at Unknown time  . B Complex Vitamins (VITAMIN B COMPLEX) TABS Take 1 tablet by mouth daily.   12/16/2018 at Unknown time  . Biotin 300 MCG TABS Take 1 tablet by mouth daily after supper.    12/16/2018 at Unknown time  . buPROPion (WELLBUTRIN XL) 150 MG 24 hr tablet Take 1 tablet (150 mg total) by mouth daily. 30 tablet 0 12/16/2018 at Unknown time  . Calcium Carbonate 500 MG CHEW Chew 1 tablet by mouth daily.   12/16/2018 at Unknown time  . cholecalciferol (VITAMIN D) 1000 UNITS tablet Take 1,000 Units by mouth daily.   12/16/2018 at Unknown time  . conjugated estrogens (PREMARIN) vaginal cream Apply 1 application topically daily as needed.     . cyanocobalamin 100 MCG tablet Take 1 tablet by mouth daily.  12/16/2018 at Unknown time  . ezetimibe (ZETIA) 10 MG tablet Take 1 tablet (10 mg total) by mouth daily. 30 tablet 0 12/16/2018 at Unknown time  . Ferrous Gluconate (IRON) 240 (27 Fe) MG TABS Take 1 tablet by mouth daily. 30 tablet 0 12/16/2018 at Unknown time  . Flaxseed, Linseed, (FLAXSEED OIL) 1000 MG CAPS Take 1 capsule by mouth.    12/16/2018 at Unknown time  . furosemide (LASIX) 40 MG tablet Take 1 tablet (40 mg total) by mouth every morning. 30 tablet 0 12/16/2018 at Unknown time  . Lecithin 1200 MG CAPS Take 1 capsule by mouth daily.   12/16/2018 at Unknown time  . levothyroxine (SYNTHROID, LEVOTHROID) 50 MCG tablet Take 1 tablet (50 mcg total) by mouth daily. 30 tablet 0 12/16/2018 at Unknown time  . Lutein 6 MG CAPS Take 1 capsule by mouth daily.   12/16/2018 at Unknown time  . Magnesium 250 MG TABS Take 1 tablet by mouth daily.   12/16/2018 at Unknown time  . metoprolol tartrate (LOPRESSOR) 25 MG tablet Take 1 tablet (25 mg  total) by mouth 2 (two) times daily. Take with food / meals 30 tablet 0 12/16/2018 at 1600  . NON FORMULARY Diet Type:  NAS     . Omega-3 Fatty Acids (FISH OIL) 1200 MG CAPS Take 1,200 mg by mouth daily.   12/16/2018 at Unknown time  . pantoprazole (PROTONIX) 40 MG tablet Take 1 tablet (40 mg total) by mouth daily as needed. (Patient taking differently: Take 40 mg by mouth daily. ) 30 tablet 0 12/16/2018 at Unknown time  . polyethylene glycol (MIRALAX / GLYCOLAX) packet Take 17 g by mouth daily. (Patient taking differently: Take 17 g by mouth daily as needed. ) 14 each 0 12/16/2018 at Unknown time  . potassium chloride (K-DUR) 10 MEQ tablet Take 1 tablet (10 mEq total) by mouth 2 (two) times daily. 60 tablet 0 12/16/2018 at Unknown time  . Turmeric 500 MG TABS Take by mouth daily.   12/16/2018 at Unknown time  . warfarin (COUMADIN) 4 MG tablet Take 1 tablet (4 mg total) by mouth daily. 30 tablet 0 12/16/2018 at 0700  . Zinc 50 MG TABS Take 1 tablet by mouth daily.   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 INR is therapeutic at 2. Pharmacy asked to dose. Home dose is 4mg  daily  Goal of Therapy:  INR 2-3 Monitor platelets by anticoagulation protocol: Yes   Plan:  Coumadin 4mg  po x 1 today PT-INR daily Monitor for S/S of bleeding  Isac Sarna, BS [place]ena [place], BCPS Clinical Pharmacist Pager [phone] 12/18/2018,8:47 AM

## 2018-12-18 NOTE — Plan of Care (Signed)
  Problem: Education: Goal: Knowledge of General Education information will improve Description: Including pain rating scale, medication(s)/side effects and non-pharmacologic comfort measures 12/18/2018 1429 by Rance Muir, RN Outcome: Adequate for Discharge 12/18/2018 0951 by Rance Muir, RN Outcome: Progressing   Problem: Health Behavior/Discharge Planning: Goal: Ability to manage health-related needs will improve 12/18/2018 1429 by Rance Muir, RN Outcome: Adequate for Discharge 12/18/2018 0951 by Rance Muir, RN Outcome: Progressing   Problem: Clinical Measurements: Goal: Ability to maintain clinical measurements within normal limits will improve 12/18/2018 1429 by Rance Muir, RN Outcome: Adequate for Discharge 12/18/2018 0951 by Rance Muir, RN Outcome: Progressing Goal: Will remain free from infection 12/18/2018 1429 by Rance Muir, RN Outcome: Adequate for Discharge 12/18/2018 0951 by Rance Muir, RN Outcome: Progressing Goal: Diagnostic test results will improve 12/18/2018 1429 by Rance Muir, RN Outcome: Adequate for Discharge 12/18/2018 0951 by Rance Muir, RN Outcome: Progressing Goal: Respiratory complications will improve 12/18/2018 1429 by Rance Muir, RN Outcome: Adequate for Discharge 12/18/2018 0951 by Rance Muir, RN Outcome: Progressing Goal: Cardiovascular complication will be avoided 12/18/2018 1429 by Rance Muir, RN Outcome: Adequate for Discharge 12/18/2018 0951 by Rance Muir, RN Outcome: Progressing   Problem: Activity: Goal: Risk for activity intolerance will decrease 12/18/2018 1429 by Rance Muir, RN Outcome: Adequate for Discharge 12/18/2018 0951 by Rance Muir, RN Outcome: Progressing   Problem: Nutrition: Goal: Adequate nutrition will be maintained 12/18/2018 1429 by Rance Muir, RN Outcome: Adequate for Discharge 12/18/2018 0951 by Rance Muir, RN Outcome: Progressing   Problem: Coping: Goal: Level of anxiety will decrease 12/18/2018 1429 by Rance Muir, RN Outcome: Adequate for  Discharge 12/18/2018 0951 by Rance Muir, RN Outcome: Progressing   Problem: Elimination: Goal: Will not experience complications related to bowel motility 12/18/2018 1429 by Rance Muir, RN Outcome: Adequate for Discharge 12/18/2018 [place] by Rance Muir, RN Outcome: Progressing Goal: Will not experience complications related to urinary retention 12/18/2018 1429 by Rance Muir, RN Outcome: Adequate for Discharge 12/18/2018 0951 by Rance Muir, RN Outcome: Progressing   Problem: Pain Managment: Goal: General experience of comfort will improve 12/18/2018 1429 by Rance Muir, RN Outcome: Adequate for Discharge 12/18/2018 [place] by Rance Muir, RN Outcome: Progressing   Problem: Safety: Goal: Ability to remain free from injury will improve 12/18/2018 1429 by Rance Muir, RN Outcome: Adequate for Discharge 12/18/2018 0951 by Rance Muir, RN Outcome: Progressing   Problem: Skin Integrity: Goal: Risk for impaired skin integrity will decrease 12/18/2018 1429 by Rance Muir, RN Outcome: Adequate for Discharge 12/18/2018 0951 by Rance Muir, RN Outcome: Progressing   Problem: Acute Rehab PT Goals(only PT should resolve) Goal: Pt Will Go Supine/Side To Sit Outcome: Adequate for Discharge Goal: Pt Will Go Sit To Supine/Side Outcome: Adequate for Discharge Goal: Patient Will Transfer Sit To/From Stand Outcome: Adequate for Discharge Goal: Pt Will Transfer Bed To Chair/Chair To Bed Outcome: Adequate for Discharge Goal: Pt Will Ambulate Outcome: Adequate for Discharge

## 2018-12-19 LAB — ECHOCARDIOGRAM COMPLETE
Height: 60 in
Weight: 2088.2 oz

## 2018-12-21 ENCOUNTER — Encounter (HOSPITAL_COMMUNITY)
Admission: RE | Admit: 2018-12-21 | Discharge: 2018-12-21 | Disposition: A | Discharge status: Home / Self Care | Payer: PPO | Source: Ambulatory Visit | Attending: Internal Medicine | Admitting: Internal Medicine

## 2018-12-21 ENCOUNTER — Other Ambulatory Visit: Payer: Self-pay

## 2018-12-21 ENCOUNTER — Ambulatory Visit (HOSPITAL_COMMUNITY)
Admission: RE | Admit: 2018-12-21 | Discharge: 2018-12-21 | Disposition: A | Discharge status: Home / Self Care | Payer: PPO | Source: Ambulatory Visit | Attending: Internal Medicine | Admitting: Internal Medicine

## 2018-12-21 DIAGNOSIS — M810 Age-related osteoporosis without current pathological fracture: Secondary | ICD-10-CM | POA: Diagnosis not present

## 2018-12-21 DIAGNOSIS — R918 Other nonspecific abnormal finding of lung field: Secondary | ICD-10-CM | POA: Diagnosis not present

## 2018-12-21 DIAGNOSIS — N200 Calculus of kidney: Secondary | ICD-10-CM | POA: Diagnosis not present

## 2018-12-21 DIAGNOSIS — R911 Solitary pulmonary nodule: Secondary | ICD-10-CM | POA: Diagnosis not present

## 2018-12-21 MED ORDER — DENOSUMAB 60 MG/ML ~~LOC~~ SOSY
60.0000 mg | PREFILLED_SYRINGE | Freq: Once | SUBCUTANEOUS | Status: AC
  Administered 2018-12-21: 60 mg via SUBCUTANEOUS

## 2018-12-21 MED ORDER — DENOSUMAB 60 MG/ML ~~LOC~~ SOSY
PREFILLED_SYRINGE | SUBCUTANEOUS | Status: AC
  Filled 2018-12-21: qty 1

## 2018-12-22 ENCOUNTER — Other Ambulatory Visit: Payer: Self-pay | Admitting: Interventional Radiology

## 2018-12-22 DIAGNOSIS — Z9889 Other specified postprocedural states: Secondary | ICD-10-CM

## 2018-12-23 ENCOUNTER — Ambulatory Visit
Admission: RE | Admit: 2018-12-23 | Discharge: 2018-12-23 | Disposition: A | Discharge status: Home / Self Care | Payer: PPO | Source: Ambulatory Visit | Attending: Interventional Radiology | Admitting: Interventional Radiology

## 2018-12-23 ENCOUNTER — Encounter: Payer: Self-pay | Admitting: *Deleted

## 2018-12-23 ENCOUNTER — Other Ambulatory Visit: Payer: Self-pay

## 2018-12-23 DIAGNOSIS — M545 Low back pain: Secondary | ICD-10-CM | POA: Diagnosis not present

## 2018-12-23 DIAGNOSIS — S22080D Wedge compression fracture of T11-T12 vertebra, subsequent encounter for fracture with routine healing: Secondary | ICD-10-CM | POA: Diagnosis not present

## 2018-12-23 DIAGNOSIS — Z9889 Other specified postprocedural states: Secondary | ICD-10-CM

## 2018-12-23 HISTORY — PX: IR RADIOLOGIST EVAL & MGMT: IMG5224

## 2018-12-23 NOTE — Progress Notes (Signed)
Chief Complaint: Back pain   Referring Physician(s): Dr. Pascal Lux  PCP: Dr. Delphina Cahill  History of Present Illness: [name] is a 83 y.o. female presenting to [facility] today as an appointment for ongoing back pain.    [name] joined me today via phone only call, given her difficulty with transportation as well as the current COVID status.   She tells me that since her treatment for a T12 compression fracture in March she has had ongoing lower back pain and hip pain.  The left hip hurts more than the right, with occasional radiating pain down the left leg.    She remembers the moment she had the T12 injury, which was when she was getting a case of water at the grocery.  This was the original injury that resulted in hospitalization and our KP performed March 30.    She tells me that she recalls some relief, but she has had ongoing pain of 5/10 intensity.  She is not taking any current opioid medication.    She has not had a recurrent injury.    She denies any other symptoms of incontinence, GI symptoms, GU symptoms, fever, rigor, chills.    Recent CT imaging of the abdomen 12/21/2018 shows changes of the T12 KP, with no obvious new deformity.    She was hospitalized recently for chest pain.    Past Medical History:  Diagnosis Date   Adenomatous colon polyp 2008   Asthma    Complication of anesthesia    CVA (cerebral vascular accident) ([place])    sl weaker on rt foot   Diverticulosis    GERD (gastroesophageal reflux disease)    Hemorrhoids 2007   HTN (hypertension)    Hyperplastic colon polyp 2005   Hypothyroidism    Narcolepsy    PONV (postoperative nausea and vomiting)    TIA (transient ischemic attack)     Past Surgical History:  Procedure Laterality Date   ABDOMINAL HYSTERECTOMY     age 31, complicated by poor wound healing, followed by revision of scar and radiation for ?malignancy   BACK SURGERY     BREAST ENHANCEMENT SURGERY      BREAST IMPLANT EXCHANGE Right 06/23/2016   Procedure: RIGHT BREAST IMPLANT REMOVAL AND REPLACEMENT;  Surgeon: Cristine Polio, MD;  Location: [facility];  Service: Plastics;  Laterality: Right;   COLONOSCOPY  2005   2 hyperplastic polyps   COLONOSCOPY  2007   Dr. Oneida Alar- hemorrhoids   COLONOSCOPY  2008   Dr. Reed Pandy polyp- rare sigmoid diverticulosis, internal hemorrhoids   COLONOSCOPY  12/01/2011   Procedure: COLONOSCOPY;  Surgeon: Danie Binder, MD;  [facility];  Service: Endoscopy;  Laterality: N/A;  12:30 PM   ESOPHAGOGASTRODUODENOSCOPY (EGD) WITH ESOPHAGEAL DILATION N/A 04/15/2013   Procedure: ESOPHAGOGASTRODUODENOSCOPY (EGD) WITH ESOPHAGEAL DILATION;  Surgeon: Danie Binder, MD;  [facility];  Service: Endoscopy;  Laterality: N/A;  11:45-moved to 12:30 Darius Bump to notify pt   EYE SURGERY  2010   EYE SURGERY     IR KYPHO LUMBAR INC FX REDUCE BONE BX UNI/BIL CANNULATION INC/IMAGING  08/23/2018   right thumb surgery      Allergies: Epinephrine, Demerol  [meperidine hcl], Demerol [meperidine], and Vicodin [hydrocodone-acetaminophen]  Medications: Prior to Admission medications   Medication Sig Start Date End Date Taking? Authorizing Provider  albuterol (PROVENTIL) (2.5 MG/3ML) 0.083% nebulizer solution Take 3 mLs (2.5 mg total) by nebulization every 6 (six) hours as  needed for wheezing or shortness of breath. 09/08/18   Gerlene Fee, NP  albuterol (VENTOLIN HFA) 108 (90 Base) MCG/ACT inhaler Inhale 1-2 puffs into the lungs every 6 (six) hours as needed for wheezing or shortness of breath.    [provider]  amLODipine (NORVASC) 5 MG tablet Take 1 tablet (5 mg total) by mouth daily. 09/08/18   Gerlene Fee, NP  Ascorbic Acid (VITAMIN C) 1000 MG tablet Take 1,000 mg by mouth daily.    [provider]  B Complex Vitamins (VITAMIN B COMPLEX) TABS Take 1 tablet by mouth daily.    [provider]   Biotin 300 MCG TABS Take 1 tablet by mouth daily after supper.     [provider]  buPROPion (WELLBUTRIN XL) 150 MG 24 hr tablet Take 1 tablet (150 mg total) by mouth daily. 09/08/18   Gerlene Fee, NP  Calcium Carbonate 500 MG CHEW Chew 1 tablet by mouth daily. 08/24/18   [provider]  cholecalciferol (VITAMIN D) 1000 UNITS tablet Take 1,000 Units by mouth daily.    [provider]  conjugated estrogens (PREMARIN) vaginal cream Apply 1 application topically daily as needed.    [provider]  cyanocobalamin 100 MCG tablet Take 1 tablet by mouth daily.  08/24/18   [provider]  ezetimibe (ZETIA) 10 MG tablet Take 1 tablet (10 mg total) by mouth daily. 09/08/18   Gerlene Fee, NP  Ferrous Gluconate (IRON) 240 (27 Fe) MG TABS Take 1 tablet by mouth daily. 09/08/18   Gerlene Fee, NP  Flaxseed, Linseed, (FLAXSEED OIL) 1000 MG CAPS Take 1 capsule by mouth.     [provider]  furosemide (LASIX) 40 MG tablet Take 1 tablet (40 mg total) by mouth every morning. 09/08/18   Gerlene Fee, NP  Lecithin 1200 MG CAPS Take 1 capsule by mouth daily.    [provider]  levETIRAcetam (KEPPRA) 250 MG tablet Take 1 tablet (250 mg total) by mouth 2 (two) times daily. 12/18/18   Johnson, Clanford L, MD  levothyroxine (SYNTHROID, LEVOTHROID) 50 MCG tablet Take 1 tablet (50 mcg total) by mouth daily. 09/08/18   Gerlene Fee, NP  Lutein 6 MG CAPS Take 1 capsule by mouth daily. 08/24/18   [provider]  Magnesium 250 MG TABS Take 1 tablet by mouth daily.    [provider]  metoprolol tartrate (LOPRESSOR) 25 MG tablet Take 0.5 tablets (12.5 mg total) by mouth 2 (two) times daily. Take with food / meals 12/18/18   Murlean Iba, MD  NON FORMULARY Diet Type:  NAS 08/24/18   [provider]  Omega-3 Fatty Acids (FISH OIL) 1200 MG CAPS Take 1,200 mg by mouth daily.    [provider]  pantoprazole  (PROTONIX) 40 MG tablet Take 1 tablet (40 mg total) by mouth daily. 12/18/18   Johnson, Clanford L, MD  polyethylene glycol (MIRALAX / GLYCOLAX) 17 g packet Take 17 g by mouth daily as needed. 12/18/18   Johnson, Clanford L, MD  potassium chloride (K-DUR) 10 MEQ tablet Take 1 tablet (10 mEq total) by mouth 2 (two) times daily. 09/08/18   Gerlene Fee, NP  Turmeric 500 MG TABS Take by mouth daily.    [provider]  warfarin (COUMADIN) 4 MG tablet Take 1 tablet (4 mg total) by mouth daily. 09/08/18   Gerlene Fee, NP  Zinc 50 MG TABS Take 1 tablet by mouth  daily.    [provider]     Family History  Problem Relation Age of Onset   Colon cancer Father        age 24   Prostate cancer Father    Pancreatic cancer Mother        age 5    Social History   Socioeconomic History   Marital status: Widowed    Spouse name: Not on file   Number of children: Not on file   Years of education: Not on file   Highest education level: Not on file  Occupational History   Not on file  Social Needs   Financial resource strain: Not on file   Food insecurity    Worry: Not on file    Inability: Not on file   Transportation needs    Medical: Not on file    Non-medical: Not on file  Tobacco Use   Smoking status: Never Smoker   Smokeless tobacco: Never Used  Substance and Sexual Activity   Alcohol use: No   Drug use: No   Sexual activity: Never  Lifestyle   Physical activity    Days per week: Not on file    Minutes per session: Not on file   Stress: Not on file  Relationships   Social connections    Talks on phone: Not on file    Gets together: Not on file    Attends religious service: Not on file    Active member of club or organization: Not on file    Attends meetings of clubs or organizations: Not on file    Relationship status: Not on file  Other Topics Concern   Not on file  Social History Narrative   Not on file      Review of  Systems  Review of Systems: A 12 point ROS discussed and pertinent positives are indicated in the HPI above.  All other systems are negative.  Physical Exam No direct physical exam was performed (except for noted visual exam findings with Video Visits).    Vital Signs: There were no vitals taken for this visit.  Imaging: Dg Chest 2 View  Result Date: 12/17/2018 CLINICAL DATA:  Chest pain. Weakness. EXAM: CHEST - 2 VIEW COMPARISON:  06/27/2013 FINDINGS: The cardiomediastinal contours are normal. Aortic atherosclerosis. The lungs are clear. Pulmonary vasculature is normal. No consolidation, pleural effusion, or pneumothorax. No acute osseous abnormalities are seen. Chronic T12 compression fracture with vertebroplasty. Peripherally calcified breast implants. IMPRESSION: No acute chest findings. Electronically Signed   By: Keith Rake M.D.   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 She denies vision changes. She woke up with strange sensation of UPPER extremity numbness. EXAM: CT HEAD WITHOUT CONTRAST TECHNIQUE: Contiguous axial images were obtained from the base of the skull through the vertex without intravenous contrast. COMPARISON:  01/03/2009 CT, 07/28/2011 MRI FINDINGS: Brain: There is central and cortical atrophy. Periventricular white matter changes are consistent with small vessel disease. There is encephalomalacia in the RIGHT frontal lobe. There is no intra or extra-axial fluid collection. The basilar cisterns and ventricles have a normal appearance. There is no CT evidence for acute infarction or hemorrhage. The 1 centimeter calcification is identified in the LEFT frontal region, adjacent to  the calvarium and consistent with meningeal calcification or small meningioma. There is no mass  effect. Vascular: There is atherosclerotic calcification of the internal carotid arteries. No hyperdense vessels. Skull: Normal. Negative for fracture or focal lesion. Sinuses/Orbits: No acute finding. Other: None IMPRESSION: 1. Atrophy and small vessel disease. 2. Encephalomalacia of the RIGHT frontal lobe. 3. No evidence for acute intracranial abnormality. Electronically Signed   By: Nolon Nations M.D.   On: 12/17/2018 15:51   Ct Chest Wo Contrast  Result Date: 12/21/2018 CLINICAL DATA:  Pt came to ED on 12/17/18 with chest heaviness and numbess/weakness in bilateral upper extremities, pain went away but this has become a recurrent problem, had kyphoplasty surgery 3 months ago Pt had CT AP 08/18/18 showing nonobst.*comment was truncated* EXAM: CT CHEST, ABDOMEN AND PELVIS WITHOUT CONTRAST TECHNIQUE: Multidetector CT imaging of the chest, abdomen and pelvis was performed following the standard protocol without IV contrast. COMPARISON:  Chest x-ray two views 12/17/2018; CT abdomen pelvis 08/18/2018 FINDINGS: CT CHEST FINDINGS Cardiovascular: Normal heart size. No pericardial effusion. There are moderate calcifications aortic arch without aneurysm and minimal coronary artery calcifications. Mediastinum/Nodes: No enlarged mediastinal, hilar, or axillary lymph nodes. Thyroid gland, trachea, and esophagus demonstrate no significant findings. Lungs/Pleura: Mild biapical pleuroparenchymal scarring. There are scattered linear opacities in bilateral lung bases follows in the lingula likely represent atelectasis or scarring. Right greater than left mild traction bronchiectasis. The central airways are patent. There is a small cyst adjacent to the right minor fissure in the right lower lobe which may represent sequela of prior infection. No focal consolidative opacity. No pleural effusion. There are a few scattered tiny pulmonary nodules in the right lung measuring up to 3 mm for example on series 102/137.  Musculoskeletal: Bilateral breast implants are in place. The left breast capsule is heavily calcified with irregular contour. Interval interval kyphoplasty at T12 for a previously seen fracture. There are multilevel degenerative changes in the lower thoracic spine. CT ABDOMEN PELVIS FINDINGS Hepatobiliary: Scattered small liver lesions appear stable though incompletely characterized in the absence of IV contrast. The gallbladder is unremarkable. Pancreas: Unremarkable. No pancreatic ductal dilatation or surrounding inflammatory changes. Spleen: Normal in size without focal abnormality. Adrenals/Urinary Tract: Normal appearance of the adrenal glands. There is mild atrophy of the left kidney similar to prior. Bilateral left greater than right extrarenal pelvises. Several small lesions seen in the right kidney including a hyperattenuating renal cyst image 33/79 are stable compared to the prior CT from 2016. Interval decrease in size of a nonobstructive left renal calculus in the lower pole measuring 4 mm. There are a couple of punctate stones in the right kidney. No hydronephrosis. The urinary bladder is unremarkable. Stomach/Bowel: Scattered colonic diverticula without evidence of diverticulitis. The appendix is not definitely seen. Vascular/Lymphatic: Mild to moderate calcified atherosclerosis in the abdominal aorta without evidence of aneurysm. No abdominopelvic lymphadenopathy. Reproductive: No adnexal masses. There are multiple scattered phleboliths in the low pelvis. The uterus is surgically absent. Other: Small fat containing umbilical hernia. No ascites. Musculoskeletal: No suspicious osseous lesion. There are few small scattered bone islands. Redemonstrated fracture at T12 with interval vertebral kyphoplasty. IMPRESSION: 1. There are few small scattered pulmonary nodules in the right lung measuring up to 3 mm. No follow-up needed if patient is low-risk (and has no known or suspected primary neoplasm).  Non-contrast chest CT can be considered in 12 months if patient is high-risk. This recommendation follows the consensus statement: Guidelines for Management of Incidental Pulmonary Nodules Detected  on CT Images: From the Fleischner Society 2017; Radiology 2017; 284:228-243. 2. Decrease in size of a nonobstructing left renal calculus measuring 4 mm. There are a few tiny punctate calculi in the right kidney. No hydronephrosis. 3. Chronic fracture at T12 with interval vertebral kyphoplasty. Electronically Signed   By: Audie Pinto M.D.   On: 12/21/2018 16:07   US Carotid Bilateral  Result Date: 12/17/2018 CLINICAL DATA:  Chest pain, numbness and weakness bilateral upper extremity, TIA. Coronary artery disease. EXAM: BILATERAL CAROTID DUPLEX ULTRASOUND TECHNIQUE: Pearline Cables scale imaging, color Doppler and duplex ultrasound were performed of bilateral carotid and vertebral arteries in the neck. COMPARISON:  None. FINDINGS: Criteria: Quantification of carotid stenosis is based on velocity parameters that correlate the residual internal carotid diameter with NASCET-based stenosis levels, using the diameter of the distal internal carotid lumen as the denominator for stenosis measurement. The following velocity measurements were obtained: RIGHT ICA: 65/15 cm/sec CCA: 08/6 cm/sec SYSTOLIC ICA/CCA RATIO:  1.5 ECA: 53 cm/sec LEFT ICA: 90/18 cm/sec CCA: 57/8 cm/sec SYSTOLIC ICA/CCA RATIO:  1.8 ECA: 63 cm/sec RIGHT CAROTID ARTERY: Mild tortuosity. Eccentric partially calcified plaque in the bulb and proximal ECA, extending to the ICA origin. No high-grade stenosis. Normal waveforms and color Doppler signal. RIGHT VERTEBRAL ARTERY:  Normal flow direction and waveform. LEFT CAROTID ARTERY: Mild plaque in the distal common carotid artery. Mild calcified plaque in the bulb and ICA origin. No high-grade stenosis. Normal waveforms and color Doppler signal. LEFT VERTEBRAL ARTERY:  Normal flow direction and waveform. IMPRESSION: 1.  Bilateral carotid bifurcation plaque resulting in less than 50% diameter ICA stenosis. 2. Antegrade bilateral vertebral arterial flow. Electronically Signed   By: Lucrezia Europe M.D.   On: 12/17/2018 15:40   Dg Bone Density  Result Date: 12/09/2018 EXAM: DUAL X-RAY ABSORPTIOMETRY (DXA) FOR BONE MINERAL DENSITY IMPRESSION: Ordering Physician:  Dr. Celene Squibb, Your patient Josefine Fuhr completed a BMD test on 12/09/2018 using the [employer] (software version: 14.10) manufactured by [employer]. The following summarizes the results of our evaluation. Technologist:[facility] PATIENT BIOGRAPHICAL: Name: KAYLI, BEAL Patient ID: [id number] Birth Date: Oct 01, 1935 Height: 61.0 in. Gender: Female Exam Date: 12/09/2018 Weight: 130.0 lbs. Indications: Advanced Age, Back surgery, Bilateral Oophrectomy, Caucasian, Early Menopause, Follow up Osteopenia, Height Loss, History of Fracture (Adult), Post Menopausal, Secondary Osteoporosis Fractures: Foot, Ribs, Toe, Wrist Treatments: Calcium, Synthroid, Vitamin D DENSITOMETRY RESULTS: Site         Region     Measured Date Measured Age WHO Classification Young Adult T-score BMD         %Change vs. Previous Significant Change (*) DualFemur Total Left 12/09/2018 81.4 Osteopenia -2.3 0.714 g/cm2 -17.3% Yes DualFemur Total Left 03/23/2013 75.7 Osteopenia -1.1 0.863 g/cm2 -2.2% - DualFemur Total Left 04/02/2004 66.7 Normal -1.0 0.882 g/cm2 - - DualFemur Total Mean 12/09/2018 81.4 Osteopenia -2.2 0.727 g/cm2 -13.8% Yes DualFemur Total Mean 03/23/2013 75.7 - - 0.843 g/cm2 -2.5% Yes DualFemur Total Mean 04/02/2004 66.7 - - 0.865 g/cm2 - - Left Forearm Radius 33% 12/09/2018 81.4 Osteopenia -1.4 0.616 g/cm2 - - ASSESSMENT: The BMD measured at Femur Total Left is 0.714 g/cm2 with a T-score of -2.3. This patient is considered osteopenic according to [facility]) criteria. The scan quality is good. Lumbar spine was excluded due to advanced  degenerative changes and surgery. Compared with the prior study on 03/23/2013, the BMD of the total mean shows a statistically significant decrease. World Health Organization [facility]) criteria for post-menopausal,  Caucasian Women: Normal:       T-score at or above -1 SD Osteopenia:   T-score between -1 and -2.5 SD Osteoporosis: T-score at or below -2.5 SD RECOMMENDATIONS: 1. All patients should optimize calcium and vitamin D intake. 2. Consider FDA-approved medical therapies in postmenopausal women and med aged 63 years and older, based on the following: a. A hip or vertebral (clinical or morphometric) fracture b. T-score< -2.5 at the femoral neck or spine after appropriate evaluation to exclude secondary causes c. Low bone mass (T-score between -1.0 and -2.5 at the femoral neck or spine) and a 10-year probability of a hip fracture > 3% or a 10-year probability of a major osteoporosis-related fracture > 20% based on the [place]-adapted WHO algorithm d. Clinician judgment and/or patient preferences may indicate treatment for people with 10-year fracture probabilities above or below these levels FOLLOW-UP: People with diagnosed cases of osteoporosis or at high risk for fracture should have regular bone mineral density tests. For patients eligible for Medicare, routine testing is allowed once every 2 years. The testing frequency can be increased to one year for patients who have rapidly progressing disease, those who are receiving or discontinuing medical therapy to restore bone mass, or have additional risk factors. I have reviewed this report, and agree with the above findings. [facility] Your patient SAHORY NORDLING completed a FRAX assessment on 12/09/2018 using the Ida (analysis version: 14.10) manufactured by [employer]. The following summarizes the results of our evaluation. PATIENT BIOGRAPHICAL: Name: AZKA, STEGER Patient ID: [id number] Birth Date: 22-Mar-1936 Height:     61.0 in. Gender:     [age]    [age]       Weight:    130.0 lbs. Ethnicity:  White                            Exam Date: 12/09/2018 FRAX* RESULTS:  (version: 3.5) 10-year Probability of Fracture1 Major Osteoporotic Fracture2 Hip Fracture 24.3% 7.5% Population: [place] (Caucasian) Risk Factors: History of Fracture (Adult), Secondary Osteoporosis Based on Femur (Left) Neck BMD 1 -The 10-year probability of fracture may be lower than reported if the patient has received treatment. 2 -Major Osteoporotic Fracture: Clinical Spine, Forearm, Hip or Shoulder *FRAX is a Materials engineer of the [employer] for Metabolic Bone Disease, a [place] (WHO) [facility]. ASSESSMENT: The probability of a major osteoporotic fracture is 24.3% within the next ten years. The probability of a hip fracture is 7.5% within the next ten years. Electronically Signed   By: Lowella Grip III M.D.   On: 12/09/2018 12:18   Ct Renal Stone Study  Result Date: 12/21/2018 CLINICAL DATA:  Pt came to ED on 12/17/18 with chest heaviness and numbess/weakness in bilateral upper extremities, pain went away but this has become a recurrent problem, had kyphoplasty surgery 3 months ago Pt had CT AP 08/18/18 showing nonobst.*comment was truncated* EXAM: CT CHEST, ABDOMEN AND PELVIS WITHOUT CONTRAST TECHNIQUE: Multidetector CT imaging of the chest, abdomen and pelvis was performed following the standard protocol without IV contrast. COMPARISON:  Chest x-ray two views 12/17/2018; CT abdomen pelvis 08/18/2018 FINDINGS: CT CHEST FINDINGS Cardiovascular: Normal heart size. No pericardial effusion. There are moderate calcifications aortic arch without aneurysm and minimal coronary artery calcifications. Mediastinum/Nodes: No enlarged mediastinal, hilar, or axillary lymph nodes. Thyroid gland, trachea, and esophagus demonstrate no significant  findings. Lungs/Pleura: Mild biapical pleuroparenchymal  scarring. There are scattered linear opacities in bilateral lung bases follows in the lingula likely represent atelectasis or scarring. Right greater than left mild traction bronchiectasis. The central airways are patent. There is a small cyst adjacent to the right minor fissure in the right lower lobe which may represent sequela of prior infection. No focal consolidative opacity. No pleural effusion. There are a few scattered tiny pulmonary nodules in the right lung measuring up to 3 mm for example on series 102/137. Musculoskeletal: Bilateral breast implants are in place. The left breast capsule is heavily calcified with irregular contour. Interval interval kyphoplasty at T12 for a previously seen fracture. There are multilevel degenerative changes in the lower thoracic spine. CT ABDOMEN PELVIS FINDINGS Hepatobiliary: Scattered small liver lesions appear stable though incompletely characterized in the absence of IV contrast. The gallbladder is unremarkable. Pancreas: Unremarkable. No pancreatic ductal dilatation or surrounding inflammatory changes. Spleen: Normal in size without focal abnormality. Adrenals/Urinary Tract: Normal appearance of the adrenal glands. There is mild atrophy of the left kidney similar to prior. Bilateral left greater than right extrarenal pelvises. Several small lesions seen in the right kidney including a hyperattenuating renal cyst image 33/79 are stable compared to the prior CT from 2016. Interval decrease in size of a nonobstructive left renal calculus in the lower pole measuring 4 mm. There are a couple of punctate stones in the right kidney. No hydronephrosis. The urinary bladder is unremarkable. Stomach/Bowel: Scattered colonic diverticula without evidence of diverticulitis. The appendix is not definitely seen. Vascular/Lymphatic: Mild to moderate calcified atherosclerosis in the abdominal aorta without evidence of aneurysm. No abdominopelvic lymphadenopathy. Reproductive: No  adnexal masses. There are multiple scattered phleboliths in the low pelvis. The uterus is surgically absent. Other: Small fat containing umbilical hernia. No ascites. Musculoskeletal: No suspicious osseous lesion. There are few small scattered bone islands. Redemonstrated fracture at T12 with interval vertebral kyphoplasty. IMPRESSION: 1. There are few small scattered pulmonary nodules in the right lung measuring up to 3 mm. No follow-up needed if patient is low-risk (and has no known or suspected primary neoplasm). Non-contrast chest CT can be considered in 12 months if patient is high-risk. This recommendation follows the consensus statement: Guidelines for Management of Incidental Pulmonary Nodules Detected on CT Images: From the Fleischner Society 2017; Radiology 2017; 284:228-243. 2. Decrease in size of a nonobstructing left renal calculus measuring 4 mm. There are a few tiny punctate calculi in the right kidney. No hydronephrosis. 3. Chronic fracture at T12 with interval vertebral kyphoplasty. Electronically Signed   By: Audie Pinto M.D.   On: 12/21/2018 16:07    Labs:  CBC: Recent Labs    08/23/18 0423 08/24/18 0440 12/17/18 0240 12/17/18 1356  WBC 7.2 6.7 6.5 6.4  HGB 12.7 13.5 14.0 12.9  HCT 40.5 41.2 43.6 40.7  PLT 290 284 297 271    COAGS: Recent Labs    08/21/18 1251  09/02/18 0435 09/09/18 0850 12/17/18 1411 12/18/18 0533  INR  --    < > 2.0* 1.3* 1.6* 2.0*  APTT >200*  --   --   --   --   --    < > = values in this interval not displayed.    BMP: Recent Labs    08/26/18 0722 09/10/18 0700 12/17/18 0240 12/17/18 1356 12/18/18 0533  NA 142 141 141  --  141  K 3.8 3.9 3.0*  --  4.5  CL 104 102 103  --  109  CO2 27 30 28   --  25  GLUCOSE 95 90 102*  --  82  BUN 24* 20 20  --  24*  CALCIUM 9.4 9.2 9.3  --  8.8*  CREATININE 1.09* 1.10* 1.21* 1.10* 1.05*  GFRNONAA 48* 47* 42* 47* 50*  GFRAA 55* 55* 49* 55* 58*    LIVER FUNCTION TESTS: Recent Labs     08/18/18 1205  BILITOT 0.6  AST 33  ALT 26  ALKPHOS 89  PROT 7.3  ALBUMIN 4.4    TUMOR MARKERS: No results for input(s): AFPTM, CEA, CA199, CHROMGRNA in the last 8760 hours.  Assessment and Plan:  Ms Castellanos is a 83 yo female with ongoing lower back pain and hip pain, SP treatment of T12 compression fracture August 23, 2018.    I have reviewed her available CT imaging, which shows no obvious new bony deformity.    I have discussed with her the chances that there could be bony abnormality that is occult on the CT, and that while MRI would be excellent for searching for occult spine/pelvis fracture, Xray is reasonable way to start given the resources/expense.    We will plan on Xray series of the spine and the pelvis, and evaluate for any obvious fracture.  We also discussed possible injections such as epidural.    Plan: - Xray pelvis - Xray thoracic spine 2 views -Xray lumbar spine 2 views - after review, we can discuss whether there is any utility in additional MRI imaging, or if possibly epidural injections may have some utility     Electronically Signed: Corrie Mckusick 12/23/2018, 5:15 PM   I spent a total of    15 Minutes in remote  clinical consultation, greater than 50% of which was counseling/coordinating care for lower back pain, SP T12 KP.    Visit type: Audio only (telephone). Audio (no video) only due to patient's lack of internet/smartphone capability. Alternative for in-person consultation at [facility], [address], [place], [place]. This visit type was conducted due to national recommendations for restrictions regarding the COVID-19 Pandemic (e.g. social distancing).  This format is felt to be most appropriate for this patient at this time.  All issues noted in this document were discussed and addressed.

## 2018-12-24 ENCOUNTER — Other Ambulatory Visit: Payer: Self-pay

## 2018-12-24 ENCOUNTER — Other Ambulatory Visit: Payer: Self-pay | Admitting: Interventional Radiology

## 2018-12-24 ENCOUNTER — Ambulatory Visit (HOSPITAL_COMMUNITY)
Admission: RE | Admit: 2018-12-24 | Discharge: 2018-12-24 | Disposition: A | Discharge status: Home / Self Care | Payer: PPO | Source: Ambulatory Visit | Attending: Interventional Radiology | Admitting: Interventional Radiology

## 2018-12-24 DIAGNOSIS — M5440 Lumbago with sciatica, unspecified side: Secondary | ICD-10-CM | POA: Diagnosis not present

## 2018-12-24 DIAGNOSIS — M546 Pain in thoracic spine: Secondary | ICD-10-CM | POA: Diagnosis not present

## 2018-12-24 DIAGNOSIS — M25551 Pain in right hip: Secondary | ICD-10-CM | POA: Insufficient documentation

## 2018-12-24 DIAGNOSIS — M545 Low back pain: Secondary | ICD-10-CM | POA: Diagnosis not present

## 2018-12-24 DIAGNOSIS — I878 Other specified disorders of veins: Secondary | ICD-10-CM | POA: Diagnosis not present

## 2018-12-24 DIAGNOSIS — M160 Bilateral primary osteoarthritis of hip: Secondary | ICD-10-CM | POA: Diagnosis not present

## 2018-12-24 DIAGNOSIS — S22080D Wedge compression fracture of T11-T12 vertebra, subsequent encounter for fracture with routine healing: Secondary | ICD-10-CM | POA: Diagnosis not present

## 2018-12-24 DIAGNOSIS — M25552 Pain in left hip: Secondary | ICD-10-CM | POA: Diagnosis not present

## 2018-12-24 DIAGNOSIS — M47816 Spondylosis without myelopathy or radiculopathy, lumbar region: Secondary | ICD-10-CM | POA: Diagnosis not present

## 2018-12-27 DIAGNOSIS — Z4789 Encounter for other orthopedic aftercare: Secondary | ICD-10-CM | POA: Diagnosis not present

## 2018-12-28 DIAGNOSIS — G47419 Narcolepsy without cataplexy: Secondary | ICD-10-CM | POA: Diagnosis not present

## 2018-12-28 DIAGNOSIS — F331 Major depressive disorder, recurrent, moderate: Secondary | ICD-10-CM | POA: Diagnosis not present

## 2018-12-28 DIAGNOSIS — S22088D Other fracture of T11-T12 vertebra, subsequent encounter for fracture with routine healing: Secondary | ICD-10-CM | POA: Diagnosis not present

## 2018-12-28 DIAGNOSIS — N200 Calculus of kidney: Secondary | ICD-10-CM | POA: Diagnosis not present

## 2018-12-28 DIAGNOSIS — Z8673 Personal history of transient ischemic attack (TIA), and cerebral infarction without residual deficits: Secondary | ICD-10-CM | POA: Diagnosis not present

## 2018-12-28 DIAGNOSIS — M545 Low back pain: Secondary | ICD-10-CM | POA: Diagnosis not present

## 2018-12-28 DIAGNOSIS — I340 Nonrheumatic mitral (valve) insufficiency: Secondary | ICD-10-CM | POA: Diagnosis not present

## 2018-12-28 DIAGNOSIS — E039 Hypothyroidism, unspecified: Secondary | ICD-10-CM | POA: Diagnosis not present

## 2018-12-28 DIAGNOSIS — M8580 Other specified disorders of bone density and structure, unspecified site: Secondary | ICD-10-CM | POA: Diagnosis not present

## 2018-12-28 DIAGNOSIS — G40009 Localization-related (focal) (partial) idiopathic epilepsy and epileptic syndromes with seizures of localized onset, not intractable, without status epilepticus: Secondary | ICD-10-CM | POA: Diagnosis not present

## 2018-12-28 DIAGNOSIS — Z7901 Long term (current) use of anticoagulants: Secondary | ICD-10-CM | POA: Diagnosis not present

## 2019-01-06 DIAGNOSIS — M5136 Other intervertebral disc degeneration, lumbar region: Secondary | ICD-10-CM | POA: Diagnosis not present

## 2019-01-08 DIAGNOSIS — J42 Unspecified chronic bronchitis: Secondary | ICD-10-CM | POA: Diagnosis not present

## 2019-01-10 ENCOUNTER — Other Ambulatory Visit (HOSPITAL_COMMUNITY): Payer: Self-pay | Admitting: Urology

## 2019-01-10 ENCOUNTER — Other Ambulatory Visit: Payer: Self-pay | Admitting: Urology

## 2019-01-10 DIAGNOSIS — N401 Enlarged prostate with lower urinary tract symptoms: Secondary | ICD-10-CM

## 2019-01-11 DIAGNOSIS — F331 Major depressive disorder, recurrent, moderate: Secondary | ICD-10-CM | POA: Diagnosis not present

## 2019-01-11 DIAGNOSIS — G40009 Localization-related (focal) (partial) idiopathic epilepsy and epileptic syndromes with seizures of localized onset, not intractable, without status epilepticus: Secondary | ICD-10-CM | POA: Diagnosis not present

## 2019-01-11 DIAGNOSIS — Z7901 Long term (current) use of anticoagulants: Secondary | ICD-10-CM | POA: Diagnosis not present

## 2019-01-11 DIAGNOSIS — E039 Hypothyroidism, unspecified: Secondary | ICD-10-CM | POA: Diagnosis not present

## 2019-01-11 DIAGNOSIS — M8580 Other specified disorders of bone density and structure, unspecified site: Secondary | ICD-10-CM | POA: Diagnosis not present

## 2019-01-11 DIAGNOSIS — S22088D Other fracture of T11-T12 vertebra, subsequent encounter for fracture with routine healing: Secondary | ICD-10-CM | POA: Diagnosis not present

## 2019-01-11 DIAGNOSIS — I340 Nonrheumatic mitral (valve) insufficiency: Secondary | ICD-10-CM | POA: Diagnosis not present

## 2019-01-11 DIAGNOSIS — N200 Calculus of kidney: Secondary | ICD-10-CM | POA: Diagnosis not present

## 2019-01-11 DIAGNOSIS — M545 Low back pain: Secondary | ICD-10-CM | POA: Diagnosis not present

## 2019-01-11 DIAGNOSIS — Z8673 Personal history of transient ischemic attack (TIA), and cerebral infarction without residual deficits: Secondary | ICD-10-CM | POA: Diagnosis not present

## 2019-01-13 ENCOUNTER — Other Ambulatory Visit (HOSPITAL_COMMUNITY): Payer: Self-pay | Admitting: Urology

## 2019-01-13 DIAGNOSIS — Q6239 Other obstructive defects of renal pelvis and ureter: Secondary | ICD-10-CM

## 2019-01-17 ENCOUNTER — Encounter (HOSPITAL_COMMUNITY): Payer: Self-pay

## 2019-01-17 ENCOUNTER — Encounter (HOSPITAL_COMMUNITY): Payer: PPO

## 2019-01-19 ENCOUNTER — Telehealth: Payer: Self-pay | Admitting: *Deleted

## 2019-01-19 NOTE — Telephone Encounter (Signed)
Dr. Earleen Newport reviewed [name]'s xrays and states everything is fine.  If she continues having pain she can be referred for Injections.Cathren Harsh

## 2019-01-21 ENCOUNTER — Other Ambulatory Visit: Payer: Self-pay | Admitting: Cardiovascular Disease

## 2019-01-24 ENCOUNTER — Other Ambulatory Visit: Payer: Self-pay | Admitting: Cardiovascular Disease

## 2019-01-26 DIAGNOSIS — M47816 Spondylosis without myelopathy or radiculopathy, lumbar region: Secondary | ICD-10-CM | POA: Diagnosis not present

## 2019-01-27 DIAGNOSIS — Z4789 Encounter for other orthopedic aftercare: Secondary | ICD-10-CM | POA: Diagnosis not present

## 2019-02-04 ENCOUNTER — Other Ambulatory Visit: Payer: Self-pay

## 2019-02-04 ENCOUNTER — Ambulatory Visit (INDEPENDENT_AMBULATORY_CARE_PROVIDER_SITE_OTHER): Payer: PPO | Admitting: Urology

## 2019-02-04 DIAGNOSIS — R3912 Poor urinary stream: Secondary | ICD-10-CM | POA: Diagnosis not present

## 2019-02-04 DIAGNOSIS — Q6239 Other obstructive defects of renal pelvis and ureter: Secondary | ICD-10-CM

## 2019-02-04 DIAGNOSIS — N3945 Continuous leakage: Secondary | ICD-10-CM

## 2019-02-04 DIAGNOSIS — N200 Calculus of kidney: Secondary | ICD-10-CM

## 2019-02-08 DIAGNOSIS — R600 Localized edema: Secondary | ICD-10-CM | POA: Diagnosis not present

## 2019-02-08 DIAGNOSIS — E039 Hypothyroidism, unspecified: Secondary | ICD-10-CM | POA: Diagnosis not present

## 2019-02-08 DIAGNOSIS — Z7901 Long term (current) use of anticoagulants: Secondary | ICD-10-CM | POA: Diagnosis not present

## 2019-02-08 DIAGNOSIS — J42 Unspecified chronic bronchitis: Secondary | ICD-10-CM | POA: Diagnosis not present

## 2019-02-08 DIAGNOSIS — M8580 Other specified disorders of bone density and structure, unspecified site: Secondary | ICD-10-CM | POA: Diagnosis not present

## 2019-02-08 DIAGNOSIS — G4089 Other seizures: Secondary | ICD-10-CM | POA: Diagnosis not present

## 2019-02-08 DIAGNOSIS — F331 Major depressive disorder, recurrent, moderate: Secondary | ICD-10-CM | POA: Diagnosis not present

## 2019-02-08 DIAGNOSIS — I340 Nonrheumatic mitral (valve) insufficiency: Secondary | ICD-10-CM | POA: Diagnosis not present

## 2019-02-08 DIAGNOSIS — N200 Calculus of kidney: Secondary | ICD-10-CM | POA: Diagnosis not present

## 2019-02-08 DIAGNOSIS — M545 Low back pain: Secondary | ICD-10-CM | POA: Diagnosis not present

## 2019-02-08 DIAGNOSIS — S22088D Other fracture of T11-T12 vertebra, subsequent encounter for fracture with routine healing: Secondary | ICD-10-CM | POA: Diagnosis not present

## 2019-02-15 DIAGNOSIS — M47816 Spondylosis without myelopathy or radiculopathy, lumbar region: Secondary | ICD-10-CM | POA: Insufficient documentation

## 2019-02-15 DIAGNOSIS — M47896 Other spondylosis, lumbar region: Secondary | ICD-10-CM | POA: Diagnosis not present

## 2019-02-26 DIAGNOSIS — Z4789 Encounter for other orthopedic aftercare: Secondary | ICD-10-CM | POA: Diagnosis not present

## 2019-03-08 DIAGNOSIS — M5136 Other intervertebral disc degeneration, lumbar region: Secondary | ICD-10-CM | POA: Diagnosis not present

## 2019-03-10 DIAGNOSIS — S22088D Other fracture of T11-T12 vertebra, subsequent encounter for fracture with routine healing: Secondary | ICD-10-CM | POA: Diagnosis not present

## 2019-03-10 DIAGNOSIS — J42 Unspecified chronic bronchitis: Secondary | ICD-10-CM | POA: Diagnosis not present

## 2019-03-10 DIAGNOSIS — I340 Nonrheumatic mitral (valve) insufficiency: Secondary | ICD-10-CM | POA: Diagnosis not present

## 2019-03-10 DIAGNOSIS — F331 Major depressive disorder, recurrent, moderate: Secondary | ICD-10-CM | POA: Diagnosis not present

## 2019-03-10 DIAGNOSIS — M545 Low back pain: Secondary | ICD-10-CM | POA: Diagnosis not present

## 2019-03-10 DIAGNOSIS — Z7901 Long term (current) use of anticoagulants: Secondary | ICD-10-CM | POA: Diagnosis not present

## 2019-03-10 DIAGNOSIS — G4089 Other seizures: Secondary | ICD-10-CM | POA: Diagnosis not present

## 2019-03-16 DIAGNOSIS — M545 Low back pain: Secondary | ICD-10-CM | POA: Diagnosis not present

## 2019-03-16 DIAGNOSIS — Z79899 Other long term (current) drug therapy: Secondary | ICD-10-CM | POA: Diagnosis not present

## 2019-03-16 DIAGNOSIS — G40219 Localization-related (focal) (partial) symptomatic epilepsy and epileptic syndromes with complex partial seizures, intractable, without status epilepticus: Secondary | ICD-10-CM | POA: Diagnosis not present

## 2019-03-16 DIAGNOSIS — I69319 Unspecified symptoms and signs involving cognitive functions following cerebral infarction: Secondary | ICD-10-CM | POA: Diagnosis not present

## 2019-03-29 DIAGNOSIS — Z4789 Encounter for other orthopedic aftercare: Secondary | ICD-10-CM | POA: Diagnosis not present

## 2019-04-07 DIAGNOSIS — I340 Nonrheumatic mitral (valve) insufficiency: Secondary | ICD-10-CM | POA: Diagnosis not present

## 2019-04-07 DIAGNOSIS — G4089 Other seizures: Secondary | ICD-10-CM | POA: Diagnosis not present

## 2019-04-07 DIAGNOSIS — Z7901 Long term (current) use of anticoagulants: Secondary | ICD-10-CM | POA: Diagnosis not present

## 2019-04-07 DIAGNOSIS — M545 Low back pain: Secondary | ICD-10-CM | POA: Diagnosis not present

## 2019-04-07 DIAGNOSIS — R0602 Shortness of breath: Secondary | ICD-10-CM | POA: Diagnosis not present

## 2019-04-07 DIAGNOSIS — F331 Major depressive disorder, recurrent, moderate: Secondary | ICD-10-CM | POA: Diagnosis not present

## 2019-04-07 DIAGNOSIS — S22088D Other fracture of T11-T12 vertebra, subsequent encounter for fracture with routine healing: Secondary | ICD-10-CM | POA: Diagnosis not present

## 2019-04-10 DIAGNOSIS — J42 Unspecified chronic bronchitis: Secondary | ICD-10-CM | POA: Diagnosis not present

## 2019-04-28 DIAGNOSIS — Z4789 Encounter for other orthopedic aftercare: Secondary | ICD-10-CM | POA: Diagnosis not present

## 2019-05-05 DIAGNOSIS — R561 Post traumatic seizures: Secondary | ICD-10-CM | POA: Diagnosis not present

## 2019-05-05 DIAGNOSIS — F331 Major depressive disorder, recurrent, moderate: Secondary | ICD-10-CM | POA: Diagnosis not present

## 2019-05-05 DIAGNOSIS — R10811 Right upper quadrant abdominal tenderness: Secondary | ICD-10-CM | POA: Diagnosis not present

## 2019-05-05 DIAGNOSIS — R55 Syncope and collapse: Secondary | ICD-10-CM | POA: Diagnosis not present

## 2019-05-05 DIAGNOSIS — R079 Chest pain, unspecified: Secondary | ICD-10-CM | POA: Diagnosis not present

## 2019-05-05 DIAGNOSIS — I340 Nonrheumatic mitral (valve) insufficiency: Secondary | ICD-10-CM | POA: Diagnosis not present

## 2019-05-05 DIAGNOSIS — E039 Hypothyroidism, unspecified: Secondary | ICD-10-CM | POA: Diagnosis not present

## 2019-05-05 DIAGNOSIS — M545 Low back pain: Secondary | ICD-10-CM | POA: Diagnosis not present

## 2019-05-05 DIAGNOSIS — Z7901 Long term (current) use of anticoagulants: Secondary | ICD-10-CM | POA: Diagnosis not present

## 2019-05-05 DIAGNOSIS — E782 Mixed hyperlipidemia: Secondary | ICD-10-CM | POA: Diagnosis not present

## 2019-05-05 DIAGNOSIS — G40009 Localization-related (focal) (partial) idiopathic epilepsy and epileptic syndromes with seizures of localized onset, not intractable, without status epilepticus: Secondary | ICD-10-CM | POA: Diagnosis not present

## 2019-05-05 DIAGNOSIS — R140 Abdominal distension (gaseous): Secondary | ICD-10-CM | POA: Diagnosis not present

## 2019-05-05 DIAGNOSIS — L659 Nonscarring hair loss, unspecified: Secondary | ICD-10-CM | POA: Diagnosis not present

## 2019-05-05 DIAGNOSIS — R1010 Upper abdominal pain, unspecified: Secondary | ICD-10-CM | POA: Diagnosis not present

## 2019-05-05 DIAGNOSIS — G4089 Other seizures: Secondary | ICD-10-CM | POA: Diagnosis not present

## 2019-05-05 DIAGNOSIS — S22088D Other fracture of T11-T12 vertebra, subsequent encounter for fracture with routine healing: Secondary | ICD-10-CM | POA: Diagnosis not present

## 2019-05-05 DIAGNOSIS — G8929 Other chronic pain: Secondary | ICD-10-CM | POA: Diagnosis not present

## 2019-05-06 ENCOUNTER — Ambulatory Visit (INDEPENDENT_AMBULATORY_CARE_PROVIDER_SITE_OTHER): Payer: PPO | Admitting: Urology

## 2019-05-06 ENCOUNTER — Other Ambulatory Visit: Payer: Self-pay

## 2019-05-06 DIAGNOSIS — R3912 Poor urinary stream: Secondary | ICD-10-CM | POA: Diagnosis not present

## 2019-05-06 DIAGNOSIS — N3946 Mixed incontinence: Secondary | ICD-10-CM

## 2019-05-10 DIAGNOSIS — J42 Unspecified chronic bronchitis: Secondary | ICD-10-CM | POA: Diagnosis not present

## 2019-05-10 DIAGNOSIS — M5136 Other intervertebral disc degeneration, lumbar region: Secondary | ICD-10-CM | POA: Diagnosis not present

## 2019-05-29 DIAGNOSIS — Z4789 Encounter for other orthopedic aftercare: Secondary | ICD-10-CM | POA: Diagnosis not present

## 2019-05-30 DIAGNOSIS — M545 Low back pain: Secondary | ICD-10-CM | POA: Diagnosis not present

## 2019-05-31 ENCOUNTER — Other Ambulatory Visit: Payer: Self-pay | Admitting: Physical Medicine and Rehabilitation

## 2019-05-31 ENCOUNTER — Other Ambulatory Visit (HOSPITAL_COMMUNITY): Payer: Self-pay | Admitting: Physical Medicine and Rehabilitation

## 2019-05-31 DIAGNOSIS — M545 Low back pain, unspecified: Secondary | ICD-10-CM

## 2019-06-02 DIAGNOSIS — Z7901 Long term (current) use of anticoagulants: Secondary | ICD-10-CM | POA: Diagnosis not present

## 2019-06-02 DIAGNOSIS — M545 Low back pain: Secondary | ICD-10-CM | POA: Diagnosis not present

## 2019-06-02 DIAGNOSIS — F331 Major depressive disorder, recurrent, moderate: Secondary | ICD-10-CM | POA: Diagnosis not present

## 2019-06-02 DIAGNOSIS — S22088D Other fracture of T11-T12 vertebra, subsequent encounter for fracture with routine healing: Secondary | ICD-10-CM | POA: Diagnosis not present

## 2019-06-02 DIAGNOSIS — R109 Unspecified abdominal pain: Secondary | ICD-10-CM | POA: Diagnosis not present

## 2019-06-02 DIAGNOSIS — I340 Nonrheumatic mitral (valve) insufficiency: Secondary | ICD-10-CM | POA: Diagnosis not present

## 2019-06-07 ENCOUNTER — Other Ambulatory Visit: Payer: Self-pay

## 2019-06-07 ENCOUNTER — Ambulatory Visit (HOSPITAL_COMMUNITY)
Admission: RE | Admit: 2019-06-07 | Discharge: 2019-06-07 | Disposition: A | Discharge status: Home / Self Care | Payer: PPO | Source: Ambulatory Visit | Attending: Physical Medicine and Rehabilitation | Admitting: Physical Medicine and Rehabilitation

## 2019-06-07 DIAGNOSIS — M545 Low back pain, unspecified: Secondary | ICD-10-CM

## 2019-06-10 DIAGNOSIS — J42 Unspecified chronic bronchitis: Secondary | ICD-10-CM | POA: Diagnosis not present

## 2019-06-16 DIAGNOSIS — D6869 Other thrombophilia: Secondary | ICD-10-CM | POA: Diagnosis not present

## 2019-06-16 DIAGNOSIS — M5136 Other intervertebral disc degeneration, lumbar region: Secondary | ICD-10-CM | POA: Diagnosis not present

## 2019-06-16 DIAGNOSIS — M47896 Other spondylosis, lumbar region: Secondary | ICD-10-CM | POA: Diagnosis not present

## 2019-06-16 DIAGNOSIS — Z8673 Personal history of transient ischemic attack (TIA), and cerebral infarction without residual deficits: Secondary | ICD-10-CM | POA: Diagnosis not present

## 2019-06-22 ENCOUNTER — Encounter (HOSPITAL_COMMUNITY)
Admission: RE | Admit: 2019-06-22 | Discharge: 2019-06-22 | Disposition: A | Discharge status: Home / Self Care | Payer: PPO | Source: Ambulatory Visit | Attending: Internal Medicine | Admitting: Internal Medicine

## 2019-06-22 ENCOUNTER — Other Ambulatory Visit: Payer: Self-pay

## 2019-06-22 DIAGNOSIS — M810 Age-related osteoporosis without current pathological fracture: Secondary | ICD-10-CM | POA: Diagnosis not present

## 2019-06-22 MED ORDER — DENOSUMAB 60 MG/ML ~~LOC~~ SOSY
PREFILLED_SYRINGE | SUBCUTANEOUS | Status: AC
  Filled 2019-06-22: qty 1

## 2019-06-22 MED ORDER — DENOSUMAB 60 MG/ML ~~LOC~~ SOSY
60.0000 mg | PREFILLED_SYRINGE | Freq: Once | SUBCUTANEOUS | Status: AC
  Administered 2019-06-22: 60 mg via SUBCUTANEOUS

## 2019-06-23 ENCOUNTER — Encounter (HOSPITAL_COMMUNITY): Admission: RE | Admit: 2019-06-23 | Payer: PPO | Source: Ambulatory Visit

## 2019-06-29 DIAGNOSIS — Z4789 Encounter for other orthopedic aftercare: Secondary | ICD-10-CM | POA: Diagnosis not present

## 2019-06-30 DIAGNOSIS — R109 Unspecified abdominal pain: Secondary | ICD-10-CM | POA: Diagnosis not present

## 2019-06-30 DIAGNOSIS — R140 Abdominal distension (gaseous): Secondary | ICD-10-CM | POA: Diagnosis not present

## 2019-06-30 DIAGNOSIS — R4189 Other symptoms and signs involving cognitive functions and awareness: Secondary | ICD-10-CM | POA: Diagnosis not present

## 2019-06-30 DIAGNOSIS — M545 Low back pain: Secondary | ICD-10-CM | POA: Diagnosis not present

## 2019-06-30 DIAGNOSIS — I340 Nonrheumatic mitral (valve) insufficiency: Secondary | ICD-10-CM | POA: Diagnosis not present

## 2019-06-30 DIAGNOSIS — Z7901 Long term (current) use of anticoagulants: Secondary | ICD-10-CM | POA: Diagnosis not present

## 2019-06-30 DIAGNOSIS — S22088D Other fracture of T11-T12 vertebra, subsequent encounter for fracture with routine healing: Secondary | ICD-10-CM | POA: Diagnosis not present

## 2019-06-30 DIAGNOSIS — F331 Major depressive disorder, recurrent, moderate: Secondary | ICD-10-CM | POA: Diagnosis not present

## 2019-06-30 DIAGNOSIS — N1330 Unspecified hydronephrosis: Secondary | ICD-10-CM | POA: Diagnosis not present

## 2019-07-07 DIAGNOSIS — M47896 Other spondylosis, lumbar region: Secondary | ICD-10-CM | POA: Diagnosis not present

## 2019-07-07 DIAGNOSIS — M47816 Spondylosis without myelopathy or radiculopathy, lumbar region: Secondary | ICD-10-CM | POA: Diagnosis not present

## 2019-07-11 DIAGNOSIS — J42 Unspecified chronic bronchitis: Secondary | ICD-10-CM | POA: Diagnosis not present

## 2019-07-19 DIAGNOSIS — G40009 Localization-related (focal) (partial) idiopathic epilepsy and epileptic syndromes with seizures of localized onset, not intractable, without status epilepticus: Secondary | ICD-10-CM | POA: Diagnosis not present

## 2019-07-19 DIAGNOSIS — R55 Syncope and collapse: Secondary | ICD-10-CM | POA: Diagnosis not present

## 2019-07-19 DIAGNOSIS — Z7901 Long term (current) use of anticoagulants: Secondary | ICD-10-CM | POA: Diagnosis not present

## 2019-07-19 DIAGNOSIS — K5900 Constipation, unspecified: Secondary | ICD-10-CM | POA: Diagnosis not present

## 2019-07-19 DIAGNOSIS — R10811 Right upper quadrant abdominal tenderness: Secondary | ICD-10-CM | POA: Diagnosis not present

## 2019-07-19 DIAGNOSIS — R519 Headache, unspecified: Secondary | ICD-10-CM | POA: Diagnosis not present

## 2019-07-19 DIAGNOSIS — N1330 Unspecified hydronephrosis: Secondary | ICD-10-CM | POA: Diagnosis not present

## 2019-07-19 DIAGNOSIS — M545 Low back pain: Secondary | ICD-10-CM | POA: Diagnosis not present

## 2019-07-19 DIAGNOSIS — I340 Nonrheumatic mitral (valve) insufficiency: Secondary | ICD-10-CM | POA: Diagnosis not present

## 2019-07-19 DIAGNOSIS — R109 Unspecified abdominal pain: Secondary | ICD-10-CM | POA: Diagnosis not present

## 2019-07-19 DIAGNOSIS — R140 Abdominal distension (gaseous): Secondary | ICD-10-CM | POA: Diagnosis not present

## 2019-07-19 DIAGNOSIS — R079 Chest pain, unspecified: Secondary | ICD-10-CM | POA: Diagnosis not present

## 2019-07-19 DIAGNOSIS — E039 Hypothyroidism, unspecified: Secondary | ICD-10-CM | POA: Diagnosis not present

## 2019-07-19 DIAGNOSIS — F331 Major depressive disorder, recurrent, moderate: Secondary | ICD-10-CM | POA: Diagnosis not present

## 2019-07-19 DIAGNOSIS — M542 Cervicalgia: Secondary | ICD-10-CM | POA: Diagnosis not present

## 2019-07-19 DIAGNOSIS — Z86718 Personal history of other venous thrombosis and embolism: Secondary | ICD-10-CM | POA: Diagnosis not present

## 2019-07-19 DIAGNOSIS — M79602 Pain in left arm: Secondary | ICD-10-CM | POA: Diagnosis not present

## 2019-07-19 DIAGNOSIS — R4189 Other symptoms and signs involving cognitive functions and awareness: Secondary | ICD-10-CM | POA: Diagnosis not present

## 2019-07-19 DIAGNOSIS — M79601 Pain in right arm: Secondary | ICD-10-CM | POA: Diagnosis not present

## 2019-07-19 DIAGNOSIS — S22088D Other fracture of T11-T12 vertebra, subsequent encounter for fracture with routine healing: Secondary | ICD-10-CM | POA: Diagnosis not present

## 2019-07-20 ENCOUNTER — Other Ambulatory Visit: Payer: Self-pay

## 2019-07-27 DIAGNOSIS — Z4789 Encounter for other orthopedic aftercare: Secondary | ICD-10-CM | POA: Diagnosis not present

## 2019-07-28 DIAGNOSIS — M47896 Other spondylosis, lumbar region: Secondary | ICD-10-CM | POA: Diagnosis not present

## 2019-08-05 ENCOUNTER — Ambulatory Visit (INDEPENDENT_AMBULATORY_CARE_PROVIDER_SITE_OTHER): Payer: PPO | Admitting: Urology

## 2019-08-05 ENCOUNTER — Encounter: Payer: Self-pay | Admitting: Urology

## 2019-08-05 ENCOUNTER — Other Ambulatory Visit: Payer: Self-pay

## 2019-08-05 ENCOUNTER — Other Ambulatory Visit (HOSPITAL_COMMUNITY)
Admission: AD | Admit: 2019-08-05 | Discharge: 2019-08-05 | Disposition: A | Discharge status: Home / Self Care | Payer: PRIVATE HEALTH INSURANCE | Source: Other Acute Inpatient Hospital | Attending: Urology | Admitting: Urology

## 2019-08-05 VITALS — BP 160/84 | HR 51 | Temp 98.1°F | Ht 60.0 in | Wt 129.0 lb

## 2019-08-05 DIAGNOSIS — R3129 Other microscopic hematuria: Secondary | ICD-10-CM

## 2019-08-05 DIAGNOSIS — Q6239 Other obstructive defects of renal pelvis and ureter: Secondary | ICD-10-CM

## 2019-08-05 DIAGNOSIS — R3912 Poor urinary stream: Secondary | ICD-10-CM | POA: Diagnosis not present

## 2019-08-05 DIAGNOSIS — N200 Calculus of kidney: Secondary | ICD-10-CM | POA: Diagnosis not present

## 2019-08-05 DIAGNOSIS — Q6211 Congenital occlusion of ureteropelvic junction: Secondary | ICD-10-CM

## 2019-08-05 DIAGNOSIS — N3946 Mixed incontinence: Secondary | ICD-10-CM | POA: Diagnosis not present

## 2019-08-05 DIAGNOSIS — N952 Postmenopausal atrophic vaginitis: Secondary | ICD-10-CM

## 2019-08-05 DIAGNOSIS — N1330 Unspecified hydronephrosis: Secondary | ICD-10-CM

## 2019-08-05 LAB — POCT URINALYSIS DIPSTICK
Bilirubin, UA: NEGATIVE
Glucose, UA: NEGATIVE
Ketones, UA: NEGATIVE
Nitrite, UA: NEGATIVE
Protein, UA: NEGATIVE
Spec Grav, UA: 1.015 (ref 1.010–1.025)
Urobilinogen, UA: 0.2 E.U./dL
pH, UA: 8 (ref 5.0–8.0)

## 2019-08-05 LAB — URINALYSIS, ROUTINE W REFLEX MICROSCOPIC
Bilirubin Urine: NEGATIVE
Glucose, UA: NEGATIVE mg/dL
Hgb urine dipstick: NEGATIVE
Ketones, ur: NEGATIVE mg/dL
Nitrite: NEGATIVE
Protein, ur: NEGATIVE mg/dL
Specific Gravity, Urine: 1.01 (ref 1.005–1.030)
pH: 7 (ref 5.0–8.0)

## 2019-08-05 NOTE — Progress Notes (Signed)
Subjective:  1. UPJ obstruction, congenital   2. Mixed incontinence urge and stress (female)(female)   3. Renal calculus   4. Hydronephrosis, unspecified hydronephrosis type   5. Postmenopausal atrophic vaginitis   6. Weak urinary stream   7. Microscopic hematuria   8. Hydronephrosis with ureteropelvic junction (UPJ) obstruction     Flow Symptoms  [name] returns today in f/u.  She has a history of a left partial UPJ obstruction and a left renal stone.  She is sent back by Dr. Nevada Crane after a lumbar MRI on 06/07/19 showed possible increase in the hydro.   She has a  history of LUTs with a reduced stream and intermittency. She has urgency and UUI at times and she wears a liner. She has rare mild dysuria at times but no hematuria.  She had a CT in 7/20 that showed stable fullness of the left collecting system and a right extrarenal pelvis. There was a LLP stone that is minimally changed.  She has chronic back pain related to spinal issues that Dr. Nelva Bush is treating with injections but she has had no flank pain or hematuria.   She had a recent lumbar MRI that showed possible interval increase in the left hydronephrosis.  A renogram in 2/20 demonstrated partial left renal obstruction with a T1/2 of about 75min and decreased perfusion.  Her Cr on 07/19/19 was 1.24.  It was 1.05 in 7/20.     ROS:  ROS:  A complete review of systems was performed.  All systems are negative except for pertinent findings as noted.   Review of Systems  Constitutional: Negative for chills and fever.  Gastrointestinal: Negative for nausea.  Genitourinary: Positive for urgency.  Musculoskeletal: Positive for back pain.    Allergies  Allergen Reactions  . Epinephrine Anaphylaxis  . Demerol  [Meperidine Hcl]   . Demerol [Meperidine] Other (See Comments)    headaches  . Vicodin [Hydrocodone-Acetaminophen] Other (See Comments)    [name] it makes her crazy. Pt tolerates acetaminophen    Outpatient Encounter  Medications as of 08/05/2019  Medication Sig  . albuterol (PROVENTIL) (2.5 MG/3ML) 0.083% nebulizer solution Take 3 mLs (2.5 mg total) by nebulization every 6 (six) hours as needed for wheezing or shortness of breath.  Marland Kitchen albuterol (VENTOLIN HFA) 108 (90 Base) MCG/ACT inhaler Inhale 1-2 puffs into the lungs every 6 (six) hours as needed for wheezing or shortness of breath.  . Ascorbic Acid (VITAMIN C) 1000 MG tablet Take 1,000 mg by mouth daily.  . B Complex Vitamins (VITAMIN B COMPLEX) TABS Take 1 tablet by mouth daily.  . Biotin 300 MCG TABS Take 1 tablet by mouth daily after supper.   Marland Kitchen buPROPion (WELLBUTRIN XL) 150 MG 24 hr tablet Take 1 tablet (150 mg total) by mouth daily.  . Calcium Carbonate 500 MG CHEW Chew 1 tablet by mouth daily.  . cholecalciferol (VITAMIN D) 1000 UNITS tablet Take 1,000 Units by mouth daily.  Marland Kitchen conjugated estrogens (PREMARIN) vaginal cream Apply 1 application topically daily as needed.  . cyanocobalamin 100 MCG tablet Take 1 tablet by mouth daily.   Marland Kitchen ezetimibe (ZETIA) 10 MG tablet Take 1 tablet (10 mg total) by mouth daily.  . Ferrous Gluconate (IRON) 240 (27 Fe) MG TABS Take 1 tablet by mouth daily.  . Flaxseed, Linseed, (FLAXSEED OIL) 1000 MG CAPS Take 1 capsule by mouth.   . furosemide (LASIX) 40 MG tablet TAKE 1 TABLET BY MOUTH TWICE DAILY  . Lecithin 1200 MG CAPS Take  1 capsule by mouth daily.  Marland Kitchen levothyroxine (SYNTHROID, LEVOTHROID) 50 MCG tablet Take 1 tablet (50 mcg total) by mouth daily.  . Lutein 6 MG CAPS Take 1 capsule by mouth daily.  . Magnesium 250 MG TABS Take 1 tablet by mouth daily.  . metoprolol tartrate (LOPRESSOR) 25 MG tablet Take 0.5 tablets (12.5 mg total) by mouth 2 (two) times daily. Take with food / meals  . NON FORMULARY Diet Type:  NAS  . Omega-3 Fatty Acids (FISH OIL) 1200 MG CAPS Take 1,200 mg by mouth daily.  . pantoprazole (PROTONIX) 40 MG tablet Take 1 tablet (40 mg total) by mouth daily.  . polyethylene glycol (MIRALAX /  GLYCOLAX) 17 g packet Take 17 g by mouth daily as needed.  . potassium chloride (K-DUR) 10 MEQ tablet Take 1 tablet (10 mEq total) by mouth 2 (two) times daily.  . Turmeric 500 MG TABS Take by mouth daily.  Marland Kitchen warfarin (COUMADIN) 4 MG tablet Take 1 tablet (4 mg total) by mouth daily.  . Zinc 50 MG TABS Take 1 tablet by mouth daily.  Marland Kitchen amLODipine (NORVASC) 5 MG tablet Take 1 tablet (5 mg total) by mouth daily.  Marland Kitchen levETIRAcetam (KEPPRA) 250 MG tablet Take 1 tablet (250 mg total) by mouth 2 (two) times daily. (Patient not taking: Reported on 08/05/2019)   No facility-administered encounter medications on file as of 08/05/2019.    Past Medical History:  Diagnosis Date  . Adenomatous colon polyp 2008  . Asthma   . Complication of anesthesia   . CVA (cerebral vascular accident) ([place])    sl weaker on rt foot  . Degenerative disc disease, lumbar   . Diverticulosis   . GERD (gastroesophageal reflux disease)   . Hemorrhoids 2007  . HTN (hypertension)   . Hyperplastic colon polyp 2005  . Hypothyroidism   . Narcolepsy   . PONV (postoperative nausea and vomiting)   . TIA (transient ischemic attack)     Past Surgical History:  Procedure Laterality Date  . ABDOMINAL HYSTERECTOMY     age 23, complicated by poor wound healing, followed by revision of scar and radiation for ?malignancy  . BACK SURGERY    . BREAST ENHANCEMENT SURGERY    . BREAST IMPLANT EXCHANGE Right 06/23/2016   Procedure: RIGHT BREAST IMPLANT REMOVAL AND REPLACEMENT;  Surgeon: Cristine Polio, MD;  Location: [facility];  Service: Plastics;  Laterality: Right;  . COLONOSCOPY  2005   2 hyperplastic polyps  . COLONOSCOPY  2007   Dr. Oneida Alar- hemorrhoids  . COLONOSCOPY  2008   Dr. Reed Pandy polyp- rare sigmoid diverticulosis, internal hemorrhoids  . COLONOSCOPY  12/01/2011   Procedure: COLONOSCOPY;  Surgeon: Danie Binder, MD;  [facility];  Service: Endoscopy;  Laterality: N/A;  12:30 PM  .  epidural steroid injection     She is getting injections with Dr. Nelva Bush q3wks.  . ESOPHAGOGASTRODUODENOSCOPY (EGD) WITH ESOPHAGEAL DILATION N/A 04/15/2013   Procedure: ESOPHAGOGASTRODUODENOSCOPY (EGD) WITH ESOPHAGEAL DILATION;  Surgeon: Danie Binder, MD;  [facility];  Service: Endoscopy;  Laterality: N/A;  11:45-moved to 12:30 Darius Bump to notify pt  . EYE SURGERY  2010  . EYE SURGERY    . IR KYPHO LUMBAR INC FX REDUCE BONE BX UNI/BIL CANNULATION INC/IMAGING  08/23/2018  . IR RADIOLOGIST EVAL & MGMT  12/23/2018  . right thumb surgery      Social History   Socioeconomic History  . Marital status: Widowed  Spouse name: Not on file  . Number of children: Not on file  . Years of education: Not on file  . Highest education level: Not on file  Occupational History  . Not on file  Tobacco Use  . Smoking status: Never Smoker  . Smokeless tobacco: Never Used  Substance and Sexual Activity  . Alcohol use: No  . Drug use: No  . Sexual activity: Never  Other Topics Concern  . Not on file  Social History Narrative  . Not on file   Social Determinants of Health   Financial Resource Strain:   . Difficulty of Paying Living Expenses:   Food Insecurity:   . Worried About Charity fundraiser in the Last Year:   . Arboriculturist in the Last Year:   Transportation Needs:   . Film/video editor (Medical):   Marland Kitchen Lack of Transportation (Non-Medical):   Physical Activity:   . Days of Exercise per Week:   . Minutes of Exercise per Session:   Stress:   . Feeling of Stress :   Social Connections:   . Frequency of Communication with Friends and Family:   . Frequency of Social Gatherings with Friends and Family:   . Attends Religious Services:   . Active Member of Clubs or Organizations:   . Attends Archivist Meetings:   Marland Kitchen Marital Status:   Intimate Partner Violence:   . Fear of Current or Ex-Partner:   . Emotionally Abused:   Marland Kitchen Physically Abused:   .  Sexually Abused:     Family History  Problem Relation Age of Onset  . Colon cancer Father        age 74  . Prostate cancer Father   . Pancreatic cancer Mother        age 55       Objective: Vitals:   08/05/19 1547  BP: (!) 160/84  Pulse: (!) 51  Temp: 98.1 F (36.7 C)     Physical Exam  Lab Results:  Results for orders placed or performed in visit on 08/05/19 (from the past 24 hour(s))  POCT urinalysis dipstick     Status: Abnormal   Collection Time: 08/05/19  4:12 PM  Result Value Ref Range   Color, UA yellow    Clarity, UA     Glucose, UA Negative Negative   Bilirubin, UA neg    Ketones, UA neg    Spec Grav, UA 1.015 1.010 - 1.025   Blood, UA +++    pH, UA 8.0 5.0 - 8.0   Protein, UA Negative Negative   Urobilinogen, UA 0.2 0.2 or 1.0 E.U./dL   Nitrite, UA neg    Leukocytes, UA Large (3+) (A) Negative   Appearance clear    Odor      BMET No results for input(s): NA, K, CL, CO2, GLUCOSE, BUN, CREATININE, CALCIUM in the last 72 hours. PSA No results found for: PSA No results found for: TESTOSTERONE    Studies/Results: No results found.    Assessment & Plan: Left UPJ obstruction with a left lower poles stone and possible progress hydro.  I am going to repeat the renogram and get a KUB to see if the stone has moved.  Microhematuria and LE+ on UA today.  UA micro and culture send.  Urgency with UUI.  Chronic and stable   No orders of the defined types were placed in this encounter.    Orders Placed This Encounter  Procedures  .  Urine Culture    Standing Status:   Future    Standing Expiration Date:   08/04/2020  . NM Renal Imaging Flow W/Pharm    Left UPJ obstruction    Standing Status:   Future    Standing Expiration Date:   08/04/2020    Order Specific Question:   If indicated for the ordered procedure, I authorize the administration of a radiopharmaceutical per Radiology protocol    Answer:   Yes    Order Specific Question:   Preferred  imaging location?    Answer:   [facility]    Order Specific Question:   Radiology Contrast Protocol - do NOT remove file path    Answer:   [email or web address]  . DG Abd 1 View    Standing Status:   Future    Standing Expiration Date:   08/04/2020    Order Specific Question:   Reason for Exam (SYMPTOM  OR DIAGNOSIS REQUIRED)    Answer:   left renal stone    Order Specific Question:   Preferred imaging location?    Answer:   [facility]    Order Specific Question:   Radiology Contrast Protocol - do NOT remove file path    Answer:   [email or web address]  . Urinalysis, Routine w reflex microscopic    Standing Status:   Future    Standing Expiration Date:   08/04/2020  . POCT urinalysis dipstick      Return in about 3 months (around 11/05/2019) for UA recheck. .   CC: Celene Squibb, MD      Irine Seal 08/05/2019

## 2019-08-07 LAB — URINE CULTURE: Culture: <10000 — AB

## 2019-08-08 DIAGNOSIS — J42 Unspecified chronic bronchitis: Secondary | ICD-10-CM | POA: Diagnosis not present

## 2019-08-16 DIAGNOSIS — S22088D Other fracture of T11-T12 vertebra, subsequent encounter for fracture with routine healing: Secondary | ICD-10-CM | POA: Diagnosis not present

## 2019-08-16 DIAGNOSIS — Z7901 Long term (current) use of anticoagulants: Secondary | ICD-10-CM | POA: Diagnosis not present

## 2019-08-16 DIAGNOSIS — F331 Major depressive disorder, recurrent, moderate: Secondary | ICD-10-CM | POA: Diagnosis not present

## 2019-08-16 DIAGNOSIS — M4306 Spondylolysis, lumbar region: Secondary | ICD-10-CM | POA: Diagnosis not present

## 2019-08-16 DIAGNOSIS — I340 Nonrheumatic mitral (valve) insufficiency: Secondary | ICD-10-CM | POA: Diagnosis not present

## 2019-08-16 DIAGNOSIS — Q6239 Other obstructive defects of renal pelvis and ureter: Secondary | ICD-10-CM | POA: Diagnosis not present

## 2019-08-16 DIAGNOSIS — R140 Abdominal distension (gaseous): Secondary | ICD-10-CM | POA: Diagnosis not present

## 2019-08-16 DIAGNOSIS — R4189 Other symptoms and signs involving cognitive functions and awareness: Secondary | ICD-10-CM | POA: Diagnosis not present

## 2019-08-16 DIAGNOSIS — N1330 Unspecified hydronephrosis: Secondary | ICD-10-CM | POA: Diagnosis not present

## 2019-08-16 DIAGNOSIS — M545 Low back pain: Secondary | ICD-10-CM | POA: Diagnosis not present

## 2019-08-18 DIAGNOSIS — M47816 Spondylosis without myelopathy or radiculopathy, lumbar region: Secondary | ICD-10-CM | POA: Diagnosis not present

## 2019-08-18 DIAGNOSIS — M47896 Other spondylosis, lumbar region: Secondary | ICD-10-CM | POA: Diagnosis not present

## 2019-08-23 ENCOUNTER — Encounter (HOSPITAL_COMMUNITY)
Admission: RE | Admit: 2019-08-23 | Discharge: 2019-08-23 | Disposition: A | Discharge status: Home / Self Care | Payer: PPO | Source: Ambulatory Visit | Attending: Urology | Admitting: Urology

## 2019-08-23 ENCOUNTER — Other Ambulatory Visit: Payer: Self-pay

## 2019-08-23 ENCOUNTER — Encounter (HOSPITAL_COMMUNITY): Payer: Self-pay

## 2019-08-23 DIAGNOSIS — Q6211 Congenital occlusion of ureteropelvic junction: Secondary | ICD-10-CM | POA: Diagnosis not present

## 2019-08-23 DIAGNOSIS — N132 Hydronephrosis with renal and ureteral calculous obstruction: Secondary | ICD-10-CM | POA: Diagnosis not present

## 2019-08-23 MED ORDER — FUROSEMIDE 10 MG/ML IJ SOLN
INTRAMUSCULAR | Status: AC
  Administered 2019-08-23: 10:00:00 30 mg via INTRAVENOUS
  Filled 2019-08-23: qty 4

## 2019-08-23 MED ORDER — TECHNETIUM TC 99M MERTIATIDE
5.0000 | Freq: Once | INTRAVENOUS | Status: AC | PRN
  Administered 2019-08-23: 5.34 via INTRAVENOUS

## 2019-08-23 MED ORDER — FUROSEMIDE 10 MG/ML IJ SOLN: 30.0000 mg | Freq: Once | INTRAMUSCULAR | Status: AC

## 2019-08-24 ENCOUNTER — Telehealth: Payer: Self-pay

## 2019-08-24 NOTE — Telephone Encounter (Signed)
-----   Message from Irine Seal, MD sent at 08/24/2019 11:34 AM EDT ----- The renogram shows some dilation left > right but no clinically signficant obstruction.  ----- Message ----- From: Dorisann Frames, RN Sent: 08/23/2019  12:43 PM EDT To: Irine Seal, MD  Please review

## 2019-08-24 NOTE — Telephone Encounter (Signed)
Pt notified of results

## 2019-08-25 DIAGNOSIS — M47816 Spondylosis without myelopathy or radiculopathy, lumbar region: Secondary | ICD-10-CM | POA: Diagnosis not present

## 2019-08-25 DIAGNOSIS — M5136 Other intervertebral disc degeneration, lumbar region: Secondary | ICD-10-CM | POA: Diagnosis not present

## 2019-08-25 DIAGNOSIS — M47896 Other spondylosis, lumbar region: Secondary | ICD-10-CM | POA: Diagnosis not present

## 2019-08-27 DIAGNOSIS — Z4789 Encounter for other orthopedic aftercare: Secondary | ICD-10-CM | POA: Diagnosis not present

## 2019-09-02 DIAGNOSIS — M5137 Other intervertebral disc degeneration, lumbosacral region: Secondary | ICD-10-CM | POA: Diagnosis not present

## 2019-09-08 DIAGNOSIS — J42 Unspecified chronic bronchitis: Secondary | ICD-10-CM | POA: Diagnosis not present

## 2019-09-14 DIAGNOSIS — Z7901 Long term (current) use of anticoagulants: Secondary | ICD-10-CM | POA: Diagnosis not present

## 2019-09-14 DIAGNOSIS — M545 Low back pain: Secondary | ICD-10-CM | POA: Diagnosis not present

## 2019-09-14 DIAGNOSIS — M4306 Spondylolysis, lumbar region: Secondary | ICD-10-CM | POA: Diagnosis not present

## 2019-09-14 DIAGNOSIS — I340 Nonrheumatic mitral (valve) insufficiency: Secondary | ICD-10-CM | POA: Diagnosis not present

## 2019-09-14 DIAGNOSIS — F331 Major depressive disorder, recurrent, moderate: Secondary | ICD-10-CM | POA: Diagnosis not present

## 2019-09-14 DIAGNOSIS — R140 Abdominal distension (gaseous): Secondary | ICD-10-CM | POA: Diagnosis not present

## 2019-09-14 DIAGNOSIS — N1330 Unspecified hydronephrosis: Secondary | ICD-10-CM | POA: Diagnosis not present

## 2019-09-14 DIAGNOSIS — Q6239 Other obstructive defects of renal pelvis and ureter: Secondary | ICD-10-CM | POA: Diagnosis not present

## 2019-09-14 DIAGNOSIS — S22088D Other fracture of T11-T12 vertebra, subsequent encounter for fracture with routine healing: Secondary | ICD-10-CM | POA: Diagnosis not present

## 2019-09-14 DIAGNOSIS — R4189 Other symptoms and signs involving cognitive functions and awareness: Secondary | ICD-10-CM | POA: Diagnosis not present

## 2019-09-22 DIAGNOSIS — Z8673 Personal history of transient ischemic attack (TIA), and cerebral infarction without residual deficits: Secondary | ICD-10-CM | POA: Diagnosis not present

## 2019-09-22 DIAGNOSIS — M5136 Other intervertebral disc degeneration, lumbar region: Secondary | ICD-10-CM | POA: Diagnosis not present

## 2019-09-22 DIAGNOSIS — M47896 Other spondylosis, lumbar region: Secondary | ICD-10-CM | POA: Diagnosis not present

## 2019-09-26 DIAGNOSIS — Z4789 Encounter for other orthopedic aftercare: Secondary | ICD-10-CM | POA: Diagnosis not present

## 2019-10-08 DIAGNOSIS — J42 Unspecified chronic bronchitis: Secondary | ICD-10-CM | POA: Diagnosis not present

## 2019-10-12 DIAGNOSIS — S22088D Other fracture of T11-T12 vertebra, subsequent encounter for fracture with routine healing: Secondary | ICD-10-CM | POA: Diagnosis not present

## 2019-10-12 DIAGNOSIS — M4306 Spondylolysis, lumbar region: Secondary | ICD-10-CM | POA: Diagnosis not present

## 2019-10-12 DIAGNOSIS — M25551 Pain in right hip: Secondary | ICD-10-CM | POA: Diagnosis not present

## 2019-10-12 DIAGNOSIS — Z7901 Long term (current) use of anticoagulants: Secondary | ICD-10-CM | POA: Diagnosis not present

## 2019-10-12 DIAGNOSIS — I340 Nonrheumatic mitral (valve) insufficiency: Secondary | ICD-10-CM | POA: Diagnosis not present

## 2019-10-12 DIAGNOSIS — R140 Abdominal distension (gaseous): Secondary | ICD-10-CM | POA: Diagnosis not present

## 2019-10-12 DIAGNOSIS — F331 Major depressive disorder, recurrent, moderate: Secondary | ICD-10-CM | POA: Diagnosis not present

## 2019-10-12 DIAGNOSIS — Q6239 Other obstructive defects of renal pelvis and ureter: Secondary | ICD-10-CM | POA: Diagnosis not present

## 2019-10-12 DIAGNOSIS — R4189 Other symptoms and signs involving cognitive functions and awareness: Secondary | ICD-10-CM | POA: Diagnosis not present

## 2019-10-12 DIAGNOSIS — G3184 Mild cognitive impairment, so stated: Secondary | ICD-10-CM | POA: Diagnosis not present

## 2019-10-12 DIAGNOSIS — N1330 Unspecified hydronephrosis: Secondary | ICD-10-CM | POA: Diagnosis not present

## 2019-10-12 DIAGNOSIS — M545 Low back pain: Secondary | ICD-10-CM | POA: Diagnosis not present

## 2019-10-27 DIAGNOSIS — Z4789 Encounter for other orthopedic aftercare: Secondary | ICD-10-CM | POA: Diagnosis not present

## 2019-11-03 DIAGNOSIS — F331 Major depressive disorder, recurrent, moderate: Secondary | ICD-10-CM | POA: Diagnosis not present

## 2019-11-03 DIAGNOSIS — G3184 Mild cognitive impairment, so stated: Secondary | ICD-10-CM | POA: Diagnosis not present

## 2019-11-03 DIAGNOSIS — M4306 Spondylolysis, lumbar region: Secondary | ICD-10-CM | POA: Diagnosis not present

## 2019-11-03 DIAGNOSIS — R4189 Other symptoms and signs involving cognitive functions and awareness: Secondary | ICD-10-CM | POA: Diagnosis not present

## 2019-11-03 DIAGNOSIS — Z7901 Long term (current) use of anticoagulants: Secondary | ICD-10-CM | POA: Diagnosis not present

## 2019-11-03 DIAGNOSIS — R140 Abdominal distension (gaseous): Secondary | ICD-10-CM | POA: Diagnosis not present

## 2019-11-03 DIAGNOSIS — S22088D Other fracture of T11-T12 vertebra, subsequent encounter for fracture with routine healing: Secondary | ICD-10-CM | POA: Diagnosis not present

## 2019-11-03 DIAGNOSIS — N1330 Unspecified hydronephrosis: Secondary | ICD-10-CM | POA: Diagnosis not present

## 2019-11-03 DIAGNOSIS — M545 Low back pain: Secondary | ICD-10-CM | POA: Diagnosis not present

## 2019-11-03 DIAGNOSIS — Q6239 Other obstructive defects of renal pelvis and ureter: Secondary | ICD-10-CM | POA: Diagnosis not present

## 2019-11-03 DIAGNOSIS — M25551 Pain in right hip: Secondary | ICD-10-CM | POA: Diagnosis not present

## 2019-11-03 DIAGNOSIS — I340 Nonrheumatic mitral (valve) insufficiency: Secondary | ICD-10-CM | POA: Diagnosis not present

## 2019-11-08 DIAGNOSIS — H04123 Dry eye syndrome of bilateral lacrimal glands: Secondary | ICD-10-CM | POA: Diagnosis not present

## 2019-11-08 DIAGNOSIS — Z961 Presence of intraocular lens: Secondary | ICD-10-CM | POA: Diagnosis not present

## 2019-11-08 DIAGNOSIS — H524 Presbyopia: Secondary | ICD-10-CM | POA: Diagnosis not present

## 2019-11-08 DIAGNOSIS — H52203 Unspecified astigmatism, bilateral: Secondary | ICD-10-CM | POA: Diagnosis not present

## 2019-11-08 DIAGNOSIS — H5203 Hypermetropia, bilateral: Secondary | ICD-10-CM | POA: Diagnosis not present

## 2019-11-11 ENCOUNTER — Other Ambulatory Visit: Payer: Self-pay

## 2019-11-11 ENCOUNTER — Encounter: Payer: Self-pay | Admitting: Urology

## 2019-11-11 ENCOUNTER — Ambulatory Visit (INDEPENDENT_AMBULATORY_CARE_PROVIDER_SITE_OTHER): Payer: PPO | Admitting: Urology

## 2019-11-11 ENCOUNTER — Other Ambulatory Visit (HOSPITAL_COMMUNITY)
Admission: AD | Admit: 2019-11-11 | Discharge: 2019-11-11 | Disposition: A | Discharge status: Home / Self Care | Payer: PPO | Source: Other Acute Inpatient Hospital | Attending: Urology | Admitting: Urology

## 2019-11-11 VITALS — BP 131/69 | HR 55 | Temp 98.4°F | Ht 60.0 in | Wt 127.0 lb

## 2019-11-11 DIAGNOSIS — N3946 Mixed incontinence: Secondary | ICD-10-CM

## 2019-11-11 DIAGNOSIS — N200 Calculus of kidney: Secondary | ICD-10-CM

## 2019-11-11 DIAGNOSIS — Q6239 Other obstructive defects of renal pelvis and ureter: Secondary | ICD-10-CM | POA: Diagnosis not present

## 2019-11-11 DIAGNOSIS — R3129 Other microscopic hematuria: Secondary | ICD-10-CM | POA: Diagnosis not present

## 2019-11-11 DIAGNOSIS — R109 Unspecified abdominal pain: Secondary | ICD-10-CM

## 2019-11-11 LAB — POCT URINALYSIS DIPSTICK
Bilirubin, UA: NEGATIVE
Glucose, UA: NEGATIVE
Ketones, UA: NEGATIVE
Nitrite, UA: NEGATIVE
Protein, UA: POSITIVE — AB
Spec Grav, UA: 1.015 (ref 1.010–1.025)
Urobilinogen, UA: 0.2 E.U./dL
pH, UA: 6 (ref 5.0–8.0)

## 2019-11-11 LAB — URINALYSIS, COMPLETE (UACMP) WITH MICROSCOPIC
Bilirubin Urine: NEGATIVE
Glucose, UA: NEGATIVE mg/dL
Ketones, ur: NEGATIVE mg/dL
Nitrite: NEGATIVE
Protein, ur: 30 mg/dL — AB
RBC / HPF: >50 RBC/hpf — ABNORMAL HIGH (ref 0–5)
Specific Gravity, Urine: 1.013 (ref 1.005–1.030)
pH: 6 (ref 5.0–8.0)

## 2019-11-11 MED ORDER — GEMTESA 75 MG PO TABS: 75.0000 mg | ORAL_TABLET | Freq: Every day | ORAL | 0 refills | Status: DC

## 2019-11-11 NOTE — Progress Notes (Signed)
Subjective:  1. UPJ obstruction, congenital   2. Renal calculus   3. Flank pain   4. Mixed incontinence urge and stress (female)(female)   5. Microhematuria     Flow Symptoms  [name] returns today in f/u.  She has a history of a left partial UPJ obstruction and a left renal stone.  She had a repeat renogram in 3/21 after a lumbar MRI showed possible increased left hydro.  The full results are noted below.  She cleared the tracer promptly with lasix and had fairly symmetrical function.  She didn't get the KUB that was ordered.  The study was ordered after a lumbar MRI on 06/07/19 showed possible increase in the hydro.     She has a  history of LUTs with a reduced stream and intermittency. She has urgency and UUI at times and she wears a liner. She has rare mild dysuria at times but no hematuria.    She has chronic back pain related to spinal issues that Dr. Nelva Bush is treating with injections but she has had no flank pain or hematuria.  She has some abdominal pain with some superior bulging.   She has bilateral back pain but it is worse on the right than the left.     A renogram in 2/20 demonstrated partial left renal obstruction with a T1/2 of about 83min and decreased perfusion.  Her Cr on 07/19/19 was 1.24.  It was 1.05 in 7/20.     ROS:  ROS:  A complete review of systems was performed.  All systems are negative except for pertinent findings as noted.   ROS  Allergies  Allergen Reactions  . Epinephrine Anaphylaxis  . Demerol  [Meperidine Hcl]   . Demerol [Meperidine] Other (See Comments)    headaches  . Vicodin [Hydrocodone-Acetaminophen] Other (See Comments)    [name] it makes her crazy. Pt tolerates acetaminophen    Outpatient Encounter Medications as of 11/11/2019  Medication Sig  . albuterol (PROVENTIL) (2.5 MG/3ML) 0.083% nebulizer solution Take 3 mLs (2.5 mg total) by nebulization every 6 (six) hours as needed for wheezing or shortness of breath.  Marland Kitchen albuterol (VENTOLIN HFA)  108 (90 Base) MCG/ACT inhaler Inhale 1-2 puffs into the lungs every 6 (six) hours as needed for wheezing or shortness of breath.  Marland Kitchen amLODipine (NORVASC) 5 MG tablet Take 1 tablet (5 mg total) by mouth daily.  . Ascorbic Acid (VITAMIN C) 1000 MG tablet Take 1,000 mg by mouth daily.  . B Complex Vitamins (VITAMIN B COMPLEX) TABS Take 1 tablet by mouth daily.  . Biotin 300 MCG TABS Take 1 tablet by mouth daily after supper.   Marland Kitchen buPROPion (WELLBUTRIN) 100 MG tablet Take by mouth.  . Calcium Carbonate 500 MG CHEW Chew 1 tablet by mouth daily.  . cholecalciferol (VITAMIN D) 1000 UNITS tablet Take 1,000 Units by mouth daily.  Marland Kitchen conjugated estrogens (PREMARIN) vaginal cream Apply 1 application topically daily as needed.  . cyanocobalamin 100 MCG tablet Take 1 tablet by mouth daily.   Marland Kitchen ezetimibe (ZETIA) 10 MG tablet Take 1 tablet (10 mg total) by mouth daily.  Marland Kitchen ezetimibe-simvastatin (VYTORIN) 10-10 MG tablet Take 1 tablet by mouth at bedtime.  . Ferrous Gluconate (IRON) 240 (27 Fe) MG TABS Take 1 tablet by mouth daily.  . Flaxseed, Linseed, (FLAXSEED OIL) 1000 MG CAPS Take 1 capsule by mouth.   . furosemide (LASIX) 40 MG tablet TAKE 1 TABLET BY MOUTH TWICE DAILY  . Lecithin 1200 MG CAPS Take  1 capsule by mouth daily.  Marland Kitchen levothyroxine (SYNTHROID, LEVOTHROID) 50 MCG tablet Take 1 tablet (50 mcg total) by mouth daily.  . Lutein 6 MG CAPS Take 1 capsule by mouth daily.  . Magnesium 250 MG TABS Take 1 tablet by mouth daily.  . metoprolol tartrate (LOPRESSOR) 25 MG tablet Take 0.5 tablets (12.5 mg total) by mouth 2 (two) times daily. Take with food / meals  . NON FORMULARY Diet Type:  NAS  . Omega-3 Fatty Acids (FISH OIL) 1200 MG CAPS Take 1,200 mg by mouth daily.  . pantoprazole (PROTONIX) 40 MG tablet Take 1 tablet (40 mg total) by mouth daily.  . polyethylene glycol (MIRALAX / GLYCOLAX) 17 g packet Take 17 g by mouth daily as needed.  . potassium chloride (K-DUR) 10 MEQ tablet Take 1 tablet (10 mEq  total) by mouth 2 (two) times daily.  . potassium chloride (KLOR-CON) 10 MEQ tablet Take 10 mEq by mouth 2 (two) times daily.  . Turmeric 500 MG TABS Take by mouth daily.  Marland Kitchen warfarin (COUMADIN) 4 MG tablet Take 1 tablet (4 mg total) by mouth daily.  . Zinc 50 MG TABS Take 1 tablet by mouth daily.  . [DISCONTINUED] metoprolol succinate (TOPROL-XL) 12.5 mg TB24 24 hr tablet Take by mouth.  . Vibegron (GEMTESA) 75 MG TABS Take 75 mg by mouth daily.  . [DISCONTINUED] buPROPion (WELLBUTRIN XL) 150 MG 24 hr tablet Take 1 tablet (150 mg total) by mouth daily.  . [DISCONTINUED] levETIRAcetam (KEPPRA) 250 MG tablet Take 1 tablet (250 mg total) by mouth 2 (two) times daily. (Patient not taking: Reported on 08/05/2019)   No facility-administered encounter medications on file as of 11/11/2019.    Past Medical History:  Diagnosis Date  . Adenomatous colon polyp 2008  . Asthma   . Complication of anesthesia   . CVA (cerebral vascular accident) ([place])    sl weaker on rt foot  . Degenerative disc disease, lumbar   . Diverticulosis   . GERD (gastroesophageal reflux disease)   . Hemorrhoids 2007  . HTN (hypertension)   . Hyperplastic colon polyp 2005  . Hypothyroidism   . Narcolepsy   . PONV (postoperative nausea and vomiting)   . TIA (transient ischemic attack)     Past Surgical History:  Procedure Laterality Date  . ABDOMINAL HYSTERECTOMY     age 77, complicated by poor wound healing, followed by revision of scar and radiation for ?malignancy  . BACK SURGERY    . BREAST ENHANCEMENT SURGERY    . BREAST IMPLANT EXCHANGE Right 06/23/2016   Procedure: RIGHT BREAST IMPLANT REMOVAL AND REPLACEMENT;  Surgeon: Cristine Polio, MD;  Location: [facility];  Service: Plastics;  Laterality: Right;  . COLONOSCOPY  2005   2 hyperplastic polyps  . COLONOSCOPY  2007   Dr. Oneida Alar- hemorrhoids  . COLONOSCOPY  2008   Dr. Reed Pandy polyp- rare sigmoid diverticulosis, internal hemorrhoids   . COLONOSCOPY  12/01/2011   Procedure: COLONOSCOPY;  Surgeon: Danie Binder, MD;  [facility];  Service: Endoscopy;  Laterality: N/A;  12:30 PM  . epidural steroid injection     She is getting injections with Dr. Nelva Bush q3wks.  . ESOPHAGOGASTRODUODENOSCOPY (EGD) WITH ESOPHAGEAL DILATION N/A 04/15/2013   Procedure: ESOPHAGOGASTRODUODENOSCOPY (EGD) WITH ESOPHAGEAL DILATION;  Surgeon: Danie Binder, MD;  [facility];  Service: Endoscopy;  Laterality: N/A;  11:45-moved to 12:30 Darius Bump to notify pt  . EYE SURGERY  2010  . EYE  SURGERY    . IR KYPHO LUMBAR INC FX REDUCE BONE BX UNI/BIL CANNULATION INC/IMAGING  08/23/2018  . IR RADIOLOGIST EVAL & MGMT  12/23/2018  . right thumb surgery      Social History   Socioeconomic History  . Marital status: Widowed    Spouse name: Not on file  . Number of children: Not on file  . Years of education: Not on file  . Highest education level: Not on file  Occupational History  . Not on file  Tobacco Use  . Smoking status: Never Smoker  . Smokeless tobacco: Never Used  Vaping Use  . Vaping Use: Never used  Substance and Sexual Activity  . Alcohol use: No  . Drug use: No  . Sexual activity: Never  Other Topics Concern  . Not on file  Social History Narrative  . Not on file   Social Determinants of Health   Financial Resource Strain:   . Difficulty of Paying Living Expenses:   Food Insecurity:   . Worried About Charity fundraiser in the Last Year:   . Arboriculturist in the Last Year:   Transportation Needs:   . Film/video editor (Medical):   Marland Kitchen Lack of Transportation (Non-Medical):   Physical Activity:   . Days of Exercise per Week:   . Minutes of Exercise per Session:   Stress:   . Feeling of Stress :   Social Connections:   . Frequency of Communication with Friends and Family:   . Frequency of Social Gatherings with Friends and Family:   . Attends Religious Services:   . Active Member of Clubs or  Organizations:   . Attends Archivist Meetings:   Marland Kitchen Marital Status:   Intimate Partner Violence:   . Fear of Current or Ex-Partner:   . Emotionally Abused:   Marland Kitchen Physically Abused:   . Sexually Abused:     Family History  Problem Relation Age of Onset  . Colon cancer Father        age 43  . Prostate cancer Father   . Pancreatic cancer Mother        age 29       Objective: Vitals:   11/11/19 1348  BP: 131/69  Pulse: (!) 55  Temp: 98.4 F (36.9 C)     Physical Exam  Lab Results:  Results for orders placed or performed in visit on 11/11/19 (from the past 24 hour(s))  POCT urinalysis dipstick     Status: Abnormal   Collection Time: 11/11/19  1:53 PM  Result Value Ref Range   Color, UA yellow    Clarity, UA     Glucose, UA Negative Negative   Bilirubin, UA neg    Ketones, UA neg    Spec Grav, UA 1.015 1.010 - 1.025   Blood, UA +++    pH, UA 6.0 5.0 - 8.0   Protein, UA Positive (A) Negative   Urobilinogen, UA 0.2 0.2 or 1.0 E.U./dL   Nitrite, UA neg    Leukocytes, UA Moderate (2+) (A) Negative   Appearance clear    Odor      BMET No results for input(s): NA, K, CL, CO2, GLUCOSE, BUN, CREATININE, CALCIUM in the last 72 hours. PSA No results found for: PSA No results found for: TESTOSTERONE    Studies/Results: CLINICAL DATA:  Hydronephrosis with ureteropelvic junction obstruction, possible kidney stone patient  EXAM: NUCLEAR MEDICINE RENAL SCAN WITH DIURETIC ADMINISTRATION  TECHNIQUE: Radionuclide angiographic  and sequential renal images were obtained after intravenous injection of radiopharmaceutical. Imaging was continued during slow intravenous injection of Lasix approximately 15 minutes after the start of the examination.  RADIOPHARMACEUTICALS:  5.34 mCi Technetium-68m MAG3 IV  Pharmaceutical: Lasix 30 mg IV at mid point of imaging  COMPARISON:  07/02/2018  Correlation: CT abdomen and pelvis 12/21/2018,  FINDINGS: Flow:   Prompt symmetric arterial flow to the kidneys.  Left renogram: Normal uptake, concentration and excretion of tracer. Tracer is excreted into a dilated LEFT renal collecting system. Gradual accumulation of tracer within the LEFT kidney with poor clearance prior to diuretic administration. Following Lasix, partial washout of tracer from the LEFT kidney occurs though retained tracer is seen within the dilated renal pelvis at the conclusion of the exam. Analysis of the renogram curve demonstrates a delayed time to peak activity of 19.2 minutes with fall to half maximum activity 3.3 minutes later.  Right renogram: Normal uptake, concentration and excretion of tracer. Mild dilatation of RIGHT renal collecting system. Good washout of tracer from the RIGHT kidney both before and continuing following Lasix administration. No significant residual tracer at the conclusion of the exam. Analysis of the renogram curve demonstrates a normal time to peak activity of 3.2 minutes with fall to half maximum activity 11.2 minutes later.  Differential:  Left kidney = 48 %  Right kidney = 52 %  T1/2 post Lasix :  Left kidney = 3.2 min  Right kidney = N/A, tracer already primarily cleared prior to Lasix  IMPRESSION: Dilated renal collecting systems bilaterally, mildly on RIGHT and moderately on LEFT.  Normal RIGHT renogram.  Inadequate clearance of tracer from the RIGHT kidney until diuretic administration, at which time prompt clearance of tracer is seen with a prompt fall to half maximum activity.  Symmetric renal function as above.   Electronically Signed   By: Lavonia Dana M.D.   On: 08/23/2019 12:31    Assessment & Plan: Left > right  UPJ obstruction with a bilateral lower poles stone but no significant obstruction on the renogram in March.  She has some increased right flank and abdominal pain.  I am going to get a CT stone study to make sure nothing has moved.  .  Microhematuria and LE+ on UA today.  UA micro and culture sent.  Last was Mx species.  Urgency with UUI.  Chronic and stable.  I am going to give her Gemtesa 75mg  samples since it has a low risk of drug interactions and doesn't impact BP which is can get elevated in her case.    Meds ordered this encounter  Medications  . Vibegron (GEMTESA) 75 MG TABS    Sig: Take 75 mg by mouth daily.    Dispense:  28 tablet    Refill:  0    Samples given     Orders Placed This Encounter  Procedures  . Urine Culture    Standing Status:   Future    Standing Expiration Date:   12/11/2019  . CT RENAL STONE STUDY    Standing Status:   Future    Standing Expiration Date:   12/11/2019    Order Specific Question:   Preferred imaging location?    Answer:   [facility]    Order Specific Question:   Radiology Contrast Protocol - do NOT remove file path    Answer:   [email or web address]  . Urinalysis, Routine w reflex microscopic    Standing Status:   Future  Standing Expiration Date:   12/11/2019  . POCT urinalysis dipstick      Return in about 4 weeks (around 12/09/2019) for with PVR and CT results. .   CC: Celene Squibb, MD      Irine Seal 11/11/2019

## 2019-11-11 NOTE — Progress Notes (Signed)
Urological Symptom Review  Patient is experiencing the following symptoms: Get up at night to urinate   Review of Systems  Gastrointestinal (upper)  : Negative for upper GI symptoms  Gastrointestinal (lower) : Negative for lower GI symptoms  Constitutional : Negative for symptoms  Skin: Negative for skin symptoms  Eyes: Negative for eye symptoms  Ear/Nose/Throat : Negative for Ear/Nose/Throat symptoms  Hematologic/Lymphatic: Negative for Hematologic/Lymphatic symptoms  Cardiovascular : Leg swelling  Respiratory : Shortness of breath  Endocrine: Negative for endocrine symptoms  Musculoskeletal: Back pain Joint pain  Neurological: Negative for neurological symptoms  Psychologic: Negative for psychiatric symptoms

## 2019-11-13 LAB — URINE CULTURE: Culture: <10000 — AB

## 2019-11-14 ENCOUNTER — Other Ambulatory Visit: Payer: Self-pay

## 2019-11-14 ENCOUNTER — Telehealth: Payer: Self-pay

## 2019-11-14 DIAGNOSIS — R3129 Other microscopic hematuria: Secondary | ICD-10-CM

## 2019-11-14 NOTE — Telephone Encounter (Signed)
Pt called and made aware of ua results and need for ct stone study.

## 2019-11-15 ENCOUNTER — Telehealth: Payer: Self-pay

## 2019-11-15 NOTE — Telephone Encounter (Signed)
Opened in error

## 2019-11-24 DIAGNOSIS — I10 Essential (primary) hypertension: Secondary | ICD-10-CM | POA: Diagnosis not present

## 2019-11-24 DIAGNOSIS — R4189 Other symptoms and signs involving cognitive functions and awareness: Secondary | ICD-10-CM | POA: Diagnosis not present

## 2019-11-24 DIAGNOSIS — N1330 Unspecified hydronephrosis: Secondary | ICD-10-CM | POA: Diagnosis not present

## 2019-11-24 DIAGNOSIS — S22088D Other fracture of T11-T12 vertebra, subsequent encounter for fracture with routine healing: Secondary | ICD-10-CM | POA: Diagnosis not present

## 2019-11-24 DIAGNOSIS — M4306 Spondylolysis, lumbar region: Secondary | ICD-10-CM | POA: Diagnosis not present

## 2019-11-24 DIAGNOSIS — R140 Abdominal distension (gaseous): Secondary | ICD-10-CM | POA: Diagnosis not present

## 2019-11-24 DIAGNOSIS — G3184 Mild cognitive impairment, so stated: Secondary | ICD-10-CM | POA: Diagnosis not present

## 2019-11-24 DIAGNOSIS — I340 Nonrheumatic mitral (valve) insufficiency: Secondary | ICD-10-CM | POA: Diagnosis not present

## 2019-11-24 DIAGNOSIS — Q6239 Other obstructive defects of renal pelvis and ureter: Secondary | ICD-10-CM | POA: Diagnosis not present

## 2019-11-24 DIAGNOSIS — Z7901 Long term (current) use of anticoagulants: Secondary | ICD-10-CM | POA: Diagnosis not present

## 2019-11-24 DIAGNOSIS — M545 Low back pain: Secondary | ICD-10-CM | POA: Diagnosis not present

## 2019-11-24 DIAGNOSIS — F331 Major depressive disorder, recurrent, moderate: Secondary | ICD-10-CM | POA: Diagnosis not present

## 2019-11-26 DIAGNOSIS — Z4789 Encounter for other orthopedic aftercare: Secondary | ICD-10-CM | POA: Diagnosis not present

## 2019-12-06 ENCOUNTER — Ambulatory Visit (HOSPITAL_COMMUNITY)
Admission: RE | Admit: 2019-12-06 | Discharge: 2019-12-06 | Disposition: A | Discharge status: Home / Self Care | Payer: PPO | Source: Ambulatory Visit | Attending: Urology | Admitting: Urology

## 2019-12-06 ENCOUNTER — Other Ambulatory Visit: Payer: Self-pay

## 2019-12-06 DIAGNOSIS — K5730 Diverticulosis of large intestine without perforation or abscess without bleeding: Secondary | ICD-10-CM | POA: Diagnosis not present

## 2019-12-06 DIAGNOSIS — I700 Atherosclerosis of aorta: Secondary | ICD-10-CM | POA: Diagnosis not present

## 2019-12-06 DIAGNOSIS — N132 Hydronephrosis with renal and ureteral calculous obstruction: Secondary | ICD-10-CM | POA: Diagnosis not present

## 2019-12-06 DIAGNOSIS — N281 Cyst of kidney, acquired: Secondary | ICD-10-CM | POA: Diagnosis not present

## 2019-12-06 DIAGNOSIS — R3129 Other microscopic hematuria: Secondary | ICD-10-CM | POA: Diagnosis not present

## 2019-12-08 DIAGNOSIS — H5022 Vertical strabismus, left eye: Secondary | ICD-10-CM | POA: Diagnosis not present

## 2019-12-08 DIAGNOSIS — H5015 Alternating exotropia: Secondary | ICD-10-CM | POA: Diagnosis not present

## 2019-12-09 ENCOUNTER — Other Ambulatory Visit: Payer: Self-pay

## 2019-12-09 ENCOUNTER — Encounter: Payer: Self-pay | Admitting: Urology

## 2019-12-09 ENCOUNTER — Ambulatory Visit (INDEPENDENT_AMBULATORY_CARE_PROVIDER_SITE_OTHER): Payer: PPO | Admitting: Urology

## 2019-12-09 VITALS — BP 170/79 | HR 55 | Temp 98.2°F | Ht 60.0 in | Wt 127.0 lb

## 2019-12-09 DIAGNOSIS — R3129 Other microscopic hematuria: Secondary | ICD-10-CM | POA: Diagnosis not present

## 2019-12-09 DIAGNOSIS — Q6211 Congenital occlusion of ureteropelvic junction: Secondary | ICD-10-CM

## 2019-12-09 DIAGNOSIS — N3941 Urge incontinence: Secondary | ICD-10-CM

## 2019-12-09 DIAGNOSIS — N200 Calculus of kidney: Secondary | ICD-10-CM

## 2019-12-09 LAB — POCT URINALYSIS DIPSTICK
Bilirubin, UA: NEGATIVE
Blood, UA: NEGATIVE
Glucose, UA: NEGATIVE
Ketones, UA: NEGATIVE
Leukocytes, UA: NEGATIVE
Nitrite, UA: NEGATIVE
Protein, UA: NEGATIVE
Spec Grav, UA: 1.01 (ref 1.010–1.025)
Urobilinogen, UA: NEGATIVE E.U./dL — AB
pH, UA: 5 (ref 5.0–8.0)

## 2019-12-09 NOTE — Assessment & Plan Note (Signed)
The 41mm renal stone has moved into the renal pelvis and could be intermittently obstructing.

## 2019-12-09 NOTE — Progress Notes (Signed)
Subjective:  1. Microscopic hematuria   2. Renal stones   3. Hydronephrosis with ureteropelvic junction (UPJ) obstruction   4. Urge incontinence     Flow Symptoms  [name] returns today in f/u.  She has a history of a left partial UPJ obstruction and a left renal stone.  She had a repeat renogram in 3/21 after a lumbar MRI showed possible increased left hydro.  A CT done prior to this visit confirms hydro consistent with a UPJ obstruction but the previously noted renal stone is now in the renal pelvis but not at the UPJ.  The degree of hydronephrosis has increased over the past 18 months. She has had no hematuria     She has a  history of LUTs with a reduced stream and intermittency. She has urgency and UUI at times and she wears a liner.   She was given Gemtesa at her last visit and has some improvement in her incontinence.  She has noted the stools are a little looser.  She has rare mild dysuria at times but no hematuria.    She has chronic back pain related to spinal issues that Dr. Nelva Bush is treating with injections.  She has bilateral back pain but it is worse on the right than the left.     A renogram in 2/20 demonstrated partial left renal obstruction with a T1/2 of about 32min and decreased perfusion.  Her Cr on 07/19/19 was 1.24.  It was 1.05 in 7/20.     ROS:  ROS:  A complete review of systems was performed.  All systems are negative except for pertinent findings as noted.   Review of Systems  Musculoskeletal: Positive for back pain.  All other systems reviewed and are negative.   Allergies  Allergen Reactions   Epinephrine Anaphylaxis   Demerol  [Meperidine Hcl]    Demerol [Meperidine] Other (See Comments)    headaches   Vicodin [Hydrocodone-Acetaminophen] Other (See Comments)    [name] it makes her crazy. Pt tolerates acetaminophen    Outpatient Encounter Medications as of 12/09/2019  Medication Sig   albuterol (PROVENTIL) (2.5 MG/3ML) 0.083% nebulizer solution  Take 3 mLs (2.5 mg total) by nebulization every 6 (six) hours as needed for wheezing or shortness of breath.   albuterol (VENTOLIN HFA) 108 (90 Base) MCG/ACT inhaler Inhale 1-2 puffs into the lungs every 6 (six) hours as needed for wheezing or shortness of breath.   amLODipine (NORVASC) 5 MG tablet Take 1 tablet (5 mg total) by mouth daily.   Ascorbic Acid (VITAMIN C) 1000 MG tablet Take 1,000 mg by mouth daily.   B Complex Vitamins (VITAMIN B COMPLEX) TABS Take 1 tablet by mouth daily.   Biotin 300 MCG TABS Take 1 tablet by mouth daily after supper.    buPROPion (WELLBUTRIN XL) 150 MG 24 hr tablet Take 150 mg by mouth daily.   Calcium Carbonate 500 MG CHEW Chew 1 tablet by mouth daily.   cholecalciferol (VITAMIN D) 1000 UNITS tablet Take 1,000 Units by mouth daily.   conjugated estrogens (PREMARIN) vaginal cream Apply 1 application topically daily as needed.   ezetimibe (ZETIA) 10 MG tablet Take 1 tablet (10 mg total) by mouth daily.   Ferrous Gluconate (IRON) 240 (27 Fe) MG TABS Take 1 tablet by mouth daily.   Flaxseed, Linseed, (FLAXSEED OIL) 1000 MG CAPS Take 1 capsule by mouth.    furosemide (LASIX) 40 MG tablet TAKE 1 TABLET BY MOUTH TWICE DAILY   Lecithin 1200 MG  CAPS Take 1 capsule by mouth daily.   levothyroxine (SYNTHROID, LEVOTHROID) 50 MCG tablet Take 1 tablet (50 mcg total) by mouth daily.   Lutein 6 MG CAPS Take 1 capsule by mouth daily.   Magnesium 250 MG TABS Take 1 tablet by mouth daily.   metoprolol tartrate (LOPRESSOR) 25 MG tablet Take 0.5 tablets (12.5 mg total) by mouth 2 (two) times daily. Take with food / meals   NON FORMULARY Diet Type:  NAS   Omega-3 Fatty Acids (FISH OIL) 1200 MG CAPS Take 1,200 mg by mouth daily.   pantoprazole (PROTONIX) 40 MG tablet Take 1 tablet (40 mg total) by mouth daily.   polyethylene glycol (MIRALAX / GLYCOLAX) 17 g packet Take 17 g by mouth daily as needed.   potassium chloride (KLOR-CON) 10 MEQ tablet Take 10 mEq by  mouth 2 (two) times daily.   Turmeric 500 MG TABS Take by mouth daily.   Vibegron (GEMTESA) 75 MG TABS Take 75 mg by mouth daily.   warfarin (COUMADIN) 4 MG tablet Take 1 tablet (4 mg total) by mouth daily.   Zinc 50 MG TABS Take 1 tablet by mouth daily.   buPROPion (WELLBUTRIN) 100 MG tablet Take by mouth. (Patient not taking: Reported on 12/09/2019)   cyanocobalamin 100 MCG tablet Take 1 tablet by mouth daily.  (Patient not taking: Reported on 12/09/2019)   ezetimibe-simvastatin (VYTORIN) 10-10 MG tablet Take 1 tablet by mouth at bedtime. (Patient not taking: Reported on 12/09/2019)   potassium chloride (K-DUR) 10 MEQ tablet Take 1 tablet (10 mEq total) by mouth 2 (two) times daily. (Patient not taking: Reported on 12/09/2019)   No facility-administered encounter medications on file as of 12/09/2019.    Past Medical History:  Diagnosis Date   Adenomatous colon polyp 2008   Asthma    Complication of anesthesia    CVA (cerebral vascular accident) ([place])    sl weaker on rt foot   Degenerative disc disease, lumbar    Diverticulosis    GERD (gastroesophageal reflux disease)    Hemorrhoids 2007   HTN (hypertension)    Hyperplastic colon polyp 2005   Hypothyroidism    Narcolepsy    PONV (postoperative nausea and vomiting)    TIA (transient ischemic attack)     Past Surgical History:  Procedure Laterality Date   ABDOMINAL HYSTERECTOMY     age 61, complicated by poor wound healing, followed by revision of scar and radiation for ?malignancy   BACK SURGERY     BREAST ENHANCEMENT SURGERY     BREAST IMPLANT EXCHANGE Right 06/23/2016   Procedure: RIGHT BREAST IMPLANT REMOVAL AND REPLACEMENT;  Surgeon: Cristine Polio, MD;  Location: [facility];  Service: Plastics;  Laterality: Right;   COLONOSCOPY  2005   2 hyperplastic polyps   COLONOSCOPY  2007   Dr. Oneida Alar- hemorrhoids   COLONOSCOPY  2008   Dr. Reed Pandy polyp- rare sigmoid  diverticulosis, internal hemorrhoids   COLONOSCOPY  12/01/2011   Procedure: COLONOSCOPY;  Surgeon: Danie Binder, MD;  [facility];  Service: Endoscopy;  Laterality: N/A;  12:30 PM   epidural steroid injection     She is getting injections with Dr. Nelva Bush q3wks.   ESOPHAGOGASTRODUODENOSCOPY (EGD) WITH ESOPHAGEAL DILATION N/A 04/15/2013   Procedure: ESOPHAGOGASTRODUODENOSCOPY (EGD) WITH ESOPHAGEAL DILATION;  Surgeon: Danie Binder, MD;  [facility];  Service: Endoscopy;  Laterality: N/A;  11:45-moved to 12:30 Darius Bump to notify pt   EYE SURGERY  2010  EYE SURGERY     IR KYPHO LUMBAR INC FX REDUCE BONE BX UNI/BIL CANNULATION INC/IMAGING  08/23/2018   IR RADIOLOGIST EVAL & MGMT  12/23/2018   right thumb surgery      Social History   Socioeconomic History   Marital status: Widowed    Spouse name: Not on file   Number of children: Not on file   Years of education: Not on file   Highest education level: Not on file  Occupational History   Not on file  Tobacco Use   Smoking status: Never Smoker   Smokeless tobacco: Never Used  Vaping Use   Vaping Use: Never used  Substance and Sexual Activity   Alcohol use: No   Drug use: No   Sexual activity: Never  Other Topics Concern   Not on file  Social History Narrative   Not on file   Social Determinants of Health   Financial Resource Strain:    Difficulty of Paying Living Expenses:   Food Insecurity:    Worried About Charity fundraiser in the Last Year:    Arboriculturist in the Last Year:   Transportation Needs:    Film/video editor (Medical):    Lack of Transportation (Non-Medical):   Physical Activity:    Days of Exercise per Week:    Minutes of Exercise per Session:   Stress:    Feeling of Stress :   Social Connections:    Frequency of Communication with Friends and Family:    Frequency of Social Gatherings with Friends and Family:    Attends Religious Services:     Active Member of Clubs or Organizations:    Attends Music therapist:    Marital Status:   Intimate Partner Violence:    Fear of Current or Ex-Partner:    Emotionally Abused:    Physically Abused:    Sexually Abused:     Family History  Problem Relation Age of Onset   Colon cancer Father        age 90   Prostate cancer Father    Pancreatic cancer Mother        age 58       Objective: Vitals:   12/09/19 0928  BP: (!) 170/79  Pulse: (!) 55  Temp: 98.2 F (36.8 C)     Physical Exam Vitals reviewed.  Constitutional:      Appearance: Normal appearance.  Neurological:     Mental Status: She is alert.     Lab Results:  Results for orders placed or performed in visit on 12/09/19 (from the past 24 hour(s))  POCT urinalysis dipstick     Status: Abnormal   Collection Time: 12/09/19  9:25 AM  Result Value Ref Range   Color, UA yellow    Clarity, UA clear    Glucose, UA Negative Negative   Bilirubin, UA neg    Ketones, UA neg    Spec Grav, UA 1.010 1.010 - 1.025   Blood, UA neg    pH, UA 5.0 5.0 - 8.0   Protein, UA Negative Negative   Urobilinogen, UA negative (A) 0.2 or 1.0 E.U./dL   Nitrite, UA neg    Leukocytes, UA Negative Negative   Appearance     Odor      BMET No results for input(s): NA, K, CL, CO2, GLUCOSE, BUN, CREATININE, CALCIUM in the last 72 hours. PSA No results found for: PSA No results found for: TESTOSTERONE  Studies/Results: CT IMPRESSION: 1. Redemonstrated moderate left hydronephrosis with an enlarged renal pelvis and abrupt caliber change of the left ureter at the ureteropelvic junction, in keeping with stricture. 2. A previously seen 6 mm calyceal calculus is now located dependently in the left renal pelvis. 3. Tiny nonobstructive right renal calculi. 4. Aortic Atherosclerosis (ICD10-I70.0).    Assessment & Plan: Hydronephrosis with ureteropelvic junction (UPJ) obstruction The degree of  hydronephrosis has increased over the last 18 months.  She had symmetric function in march but with the movement of the stone and increased hydro, I will get a repeat nuclear renogram with lasix.   If she has increased functional obstruction I will refer her for consideration of a pyeloplasty and nephrolithotomy.     Renal stones The 16mm renal stone has moved into the renal pelvis and could be intermittently obstructing.     Urge incontinence Gemtesa given at last visit with a positive response.  An additional 6 weeks of samples were given.      No orders of the defined types were placed in this encounter.    Orders Placed This Encounter  Procedures   NM Renal Imaging Flow W/Pharm    Standing Status:   Future    Standing Expiration Date:   01/09/2020    Order Specific Question:   ** REASON FOR EXAM (FREE TEXT)    Answer:   progressive hydronephrosis with left UPJ obstruction.    Order Specific Question:   If indicated for the ordered procedure, I authorize the administration of a radiopharmaceutical per Radiology protocol    Answer:   Yes    Order Specific Question:   Preferred imaging location?    Answer:   [facility]    Order Specific Question:   Radiology Contrast Protocol - do NOT remove file path    Answer:   [email or web address]   POCT urinalysis dipstick   BLADDER SCAN AMB NON-IMAGING      Return in about 6 weeks (around 01/20/2020).   CC: Celene Squibb, MD      [name] 12/09/2019

## 2019-12-09 NOTE — Assessment & Plan Note (Addendum)
The degree of hydronephrosis has increased over the last 18 months.  She had symmetric function in march but with the movement of the stone and increased hydro, I will get a repeat nuclear renogram with lasix.   If she has increased functional obstruction I will refer her for consideration of a pyeloplasty and nephrolithotomy.

## 2019-12-09 NOTE — Assessment & Plan Note (Addendum)
Gemtesa given at last visit with a positive response.  An additional 6 weeks of samples were given.

## 2019-12-09 NOTE — Progress Notes (Signed)
Urological Symptom Review  Patient is experiencing the following symptoms: Get up at night to urinate   Review of Systems  Gastrointestinal (upper)  : Negative for upper GI symptoms  Gastrointestinal (lower) : Negative for lower GI symptoms  Constitutional : Negative for symptoms  Skin: Negative for skin symptoms  Eyes: Negative for eye symptoms  Ear/Nose/Throat : Negative for Ear/Nose/Throat symptoms  Hematologic/Lymphatic: Easy bruising  Cardiovascular : Leg swelling  Respiratory : Shortness of breath  Endocrine: Negative for endocrine symptoms  Musculoskeletal: Back pain Joint pain  Neurological: Negative for neurological symptoms  Psychologic: Negative for psychiatric symptoms

## 2019-12-12 DIAGNOSIS — Z7901 Long term (current) use of anticoagulants: Secondary | ICD-10-CM | POA: Diagnosis not present

## 2019-12-12 DIAGNOSIS — F331 Major depressive disorder, recurrent, moderate: Secondary | ICD-10-CM | POA: Diagnosis not present

## 2019-12-12 DIAGNOSIS — N1330 Unspecified hydronephrosis: Secondary | ICD-10-CM | POA: Diagnosis not present

## 2019-12-12 DIAGNOSIS — I10 Essential (primary) hypertension: Secondary | ICD-10-CM | POA: Diagnosis not present

## 2019-12-12 DIAGNOSIS — S22088D Other fracture of T11-T12 vertebra, subsequent encounter for fracture with routine healing: Secondary | ICD-10-CM | POA: Diagnosis not present

## 2019-12-12 DIAGNOSIS — Q6239 Other obstructive defects of renal pelvis and ureter: Secondary | ICD-10-CM | POA: Diagnosis not present

## 2019-12-12 DIAGNOSIS — M545 Low back pain: Secondary | ICD-10-CM | POA: Diagnosis not present

## 2019-12-12 DIAGNOSIS — I340 Nonrheumatic mitral (valve) insufficiency: Secondary | ICD-10-CM | POA: Diagnosis not present

## 2019-12-12 DIAGNOSIS — R140 Abdominal distension (gaseous): Secondary | ICD-10-CM | POA: Diagnosis not present

## 2019-12-12 DIAGNOSIS — G3184 Mild cognitive impairment, so stated: Secondary | ICD-10-CM | POA: Diagnosis not present

## 2019-12-12 DIAGNOSIS — M4306 Spondylolysis, lumbar region: Secondary | ICD-10-CM | POA: Diagnosis not present

## 2019-12-12 DIAGNOSIS — R4189 Other symptoms and signs involving cognitive functions and awareness: Secondary | ICD-10-CM | POA: Diagnosis not present

## 2019-12-16 ENCOUNTER — Ambulatory Visit (HOSPITAL_COMMUNITY)
Admission: RE | Admit: 2019-12-16 | Discharge: 2019-12-16 | Disposition: A | Discharge status: Home / Self Care | Payer: PPO | Source: Ambulatory Visit | Attending: Urology | Admitting: Urology

## 2019-12-16 ENCOUNTER — Encounter (HOSPITAL_COMMUNITY): Payer: Self-pay

## 2019-12-16 ENCOUNTER — Other Ambulatory Visit (HOSPITAL_COMMUNITY): Payer: PPO

## 2019-12-16 ENCOUNTER — Other Ambulatory Visit: Payer: Self-pay

## 2019-12-16 DIAGNOSIS — N2889 Other specified disorders of kidney and ureter: Secondary | ICD-10-CM | POA: Diagnosis not present

## 2019-12-16 DIAGNOSIS — Q6211 Congenital occlusion of ureteropelvic junction: Secondary | ICD-10-CM | POA: Diagnosis not present

## 2019-12-16 DIAGNOSIS — N1330 Unspecified hydronephrosis: Secondary | ICD-10-CM | POA: Diagnosis not present

## 2019-12-16 MED ORDER — FUROSEMIDE 10 MG/ML IJ SOLN
INTRAMUSCULAR | Status: AC
  Administered 2019-12-16: 30 mg via INTRAVENOUS
  Filled 2019-12-16: qty 2

## 2019-12-16 MED ORDER — TECHNETIUM TC 99M MERTIATIDE
5.0000 | Freq: Once | INTRAVENOUS | Status: AC | PRN
  Administered 2019-12-16: 5.08 via INTRAVENOUS

## 2019-12-16 MED ORDER — FUROSEMIDE 10 MG/ML IJ SOLN
INTRAMUSCULAR | Status: AC
  Filled 2019-12-16: qty 2

## 2019-12-20 ENCOUNTER — Inpatient Hospital Stay (HOSPITAL_COMMUNITY): Admission: RE | Admit: 2019-12-20 | Payer: PPO | Source: Ambulatory Visit

## 2019-12-20 ENCOUNTER — Encounter (HOSPITAL_COMMUNITY): Admission: RE | Admit: 2019-12-20 | Payer: PPO | Source: Ambulatory Visit

## 2019-12-23 ENCOUNTER — Other Ambulatory Visit: Payer: Self-pay | Admitting: Urology

## 2019-12-23 DIAGNOSIS — N200 Calculus of kidney: Secondary | ICD-10-CM

## 2019-12-23 DIAGNOSIS — Q6211 Congenital occlusion of ureteropelvic junction: Secondary | ICD-10-CM

## 2019-12-23 NOTE — Progress Notes (Signed)
There is some obstruction on the left with mild delay in contrast excretion and some diminished renal function on the left compared to the right and with the stone that is present it would be worthwhile to have her evaluated for a possible robotic pyeloplasty and stone removal.   I will order a referral to Dr. Lovena Neighbours if our [facility] office if she is agreeable to that.

## 2019-12-23 NOTE — Progress Notes (Signed)
She has a left partial UPJ obstruction with 42% function vs 58% on the right and she has a renal stone that is moving about in the renal pelvis and could be causing intermittent additional obstruction.   I am going to refer her to Dr. Lovena Neighbours for consideration of a pyeloplasty and lithotomy.

## 2019-12-26 ENCOUNTER — Telehealth: Payer: Self-pay

## 2019-12-26 NOTE — Telephone Encounter (Signed)
Pt notified of results and referral to Dr. Lovena Neighbours.

## 2019-12-26 NOTE — Telephone Encounter (Signed)
-----   Message from Irine Seal, MD sent at 12/23/2019 12:38 PM EDT ----- There is some obstruction on the left with mild delay in contrast excretion and some diminished renal function on the left compared to the right and with the stone that is present it would be worthwhile to have her evaluated for a possible robotic pyeloplasty and stone removal.   I will order a referral to Dr. Lovena Neighbours if our [facility] office if she is agreeable to that.

## 2019-12-30 ENCOUNTER — Encounter (HOSPITAL_COMMUNITY)
Admission: RE | Admit: 2019-12-30 | Discharge: 2019-12-30 | Disposition: A | Discharge status: Home / Self Care | Payer: PPO | Source: Ambulatory Visit | Attending: Internal Medicine | Admitting: Internal Medicine

## 2019-12-30 ENCOUNTER — Other Ambulatory Visit: Payer: Self-pay

## 2019-12-30 ENCOUNTER — Encounter (HOSPITAL_COMMUNITY): Payer: Self-pay

## 2019-12-30 DIAGNOSIS — M810 Age-related osteoporosis without current pathological fracture: Secondary | ICD-10-CM | POA: Insufficient documentation

## 2019-12-30 MED ORDER — DENOSUMAB 60 MG/ML ~~LOC~~ SOSY
60.0000 mg | PREFILLED_SYRINGE | Freq: Once | SUBCUTANEOUS | Status: AC
  Administered 2019-12-30: 60 mg via SUBCUTANEOUS

## 2020-01-02 DIAGNOSIS — I10 Essential (primary) hypertension: Secondary | ICD-10-CM | POA: Diagnosis not present

## 2020-01-02 DIAGNOSIS — G3184 Mild cognitive impairment, so stated: Secondary | ICD-10-CM | POA: Diagnosis not present

## 2020-01-02 DIAGNOSIS — R4189 Other symptoms and signs involving cognitive functions and awareness: Secondary | ICD-10-CM | POA: Diagnosis not present

## 2020-01-02 DIAGNOSIS — I340 Nonrheumatic mitral (valve) insufficiency: Secondary | ICD-10-CM | POA: Diagnosis not present

## 2020-01-02 DIAGNOSIS — Q6239 Other obstructive defects of renal pelvis and ureter: Secondary | ICD-10-CM | POA: Diagnosis not present

## 2020-01-02 DIAGNOSIS — M545 Low back pain: Secondary | ICD-10-CM | POA: Diagnosis not present

## 2020-01-02 DIAGNOSIS — R140 Abdominal distension (gaseous): Secondary | ICD-10-CM | POA: Diagnosis not present

## 2020-01-02 DIAGNOSIS — M4306 Spondylolysis, lumbar region: Secondary | ICD-10-CM | POA: Diagnosis not present

## 2020-01-02 DIAGNOSIS — F331 Major depressive disorder, recurrent, moderate: Secondary | ICD-10-CM | POA: Diagnosis not present

## 2020-01-02 DIAGNOSIS — Z7901 Long term (current) use of anticoagulants: Secondary | ICD-10-CM | POA: Diagnosis not present

## 2020-01-02 DIAGNOSIS — S22088D Other fracture of T11-T12 vertebra, subsequent encounter for fracture with routine healing: Secondary | ICD-10-CM | POA: Diagnosis not present

## 2020-01-02 DIAGNOSIS — N1330 Unspecified hydronephrosis: Secondary | ICD-10-CM | POA: Diagnosis not present

## 2020-01-17 DIAGNOSIS — N200 Calculus of kidney: Secondary | ICD-10-CM | POA: Diagnosis not present

## 2020-01-17 DIAGNOSIS — Q6239 Other obstructive defects of renal pelvis and ureter: Secondary | ICD-10-CM | POA: Diagnosis not present

## 2020-01-18 ENCOUNTER — Telehealth: Payer: Self-pay

## 2020-01-18 NOTE — Telephone Encounter (Signed)
Pt went to [facility] and saw Dr. Jackson Latino on 01/17/2020. Pt will f/u with Dr. Jackson Latino for her urinary issues.

## 2020-01-18 NOTE — Telephone Encounter (Signed)
-----   Message from Irine Seal, MD sent at 01/17/2020  9:32 AM EDT ----- She was supposed to be getting set up to see Dr. Lovena Neighbours in the [facility] office.  If she has that scheduled, she doesn't need the appointment Friday.

## 2020-01-20 ENCOUNTER — Ambulatory Visit: Payer: PPO | Admitting: Urology

## 2020-01-31 DIAGNOSIS — R4189 Other symptoms and signs involving cognitive functions and awareness: Secondary | ICD-10-CM | POA: Diagnosis not present

## 2020-01-31 DIAGNOSIS — I10 Essential (primary) hypertension: Secondary | ICD-10-CM | POA: Diagnosis not present

## 2020-01-31 DIAGNOSIS — M545 Low back pain: Secondary | ICD-10-CM | POA: Diagnosis not present

## 2020-01-31 DIAGNOSIS — Q6239 Other obstructive defects of renal pelvis and ureter: Secondary | ICD-10-CM | POA: Diagnosis not present

## 2020-01-31 DIAGNOSIS — Z0001 Encounter for general adult medical examination with abnormal findings: Secondary | ICD-10-CM | POA: Diagnosis not present

## 2020-01-31 DIAGNOSIS — S22088D Other fracture of T11-T12 vertebra, subsequent encounter for fracture with routine healing: Secondary | ICD-10-CM | POA: Diagnosis not present

## 2020-01-31 DIAGNOSIS — Z7901 Long term (current) use of anticoagulants: Secondary | ICD-10-CM | POA: Diagnosis not present

## 2020-01-31 DIAGNOSIS — F331 Major depressive disorder, recurrent, moderate: Secondary | ICD-10-CM | POA: Diagnosis not present

## 2020-01-31 DIAGNOSIS — I340 Nonrheumatic mitral (valve) insufficiency: Secondary | ICD-10-CM | POA: Diagnosis not present

## 2020-01-31 DIAGNOSIS — M4306 Spondylolysis, lumbar region: Secondary | ICD-10-CM | POA: Diagnosis not present

## 2020-01-31 DIAGNOSIS — R140 Abdominal distension (gaseous): Secondary | ICD-10-CM | POA: Diagnosis not present

## 2020-01-31 DIAGNOSIS — N1330 Unspecified hydronephrosis: Secondary | ICD-10-CM | POA: Diagnosis not present

## 2020-01-31 DIAGNOSIS — G3184 Mild cognitive impairment, so stated: Secondary | ICD-10-CM | POA: Diagnosis not present

## 2020-02-07 ENCOUNTER — Other Ambulatory Visit: Payer: Self-pay | Admitting: Urology

## 2020-02-21 DIAGNOSIS — Z7901 Long term (current) use of anticoagulants: Secondary | ICD-10-CM | POA: Diagnosis not present

## 2020-02-21 DIAGNOSIS — N200 Calculus of kidney: Secondary | ICD-10-CM | POA: Diagnosis not present

## 2020-02-21 DIAGNOSIS — M25551 Pain in right hip: Secondary | ICD-10-CM | POA: Diagnosis not present

## 2020-02-22 NOTE — Patient Instructions (Addendum)
DUE TO COVID-19 ONLY ONE VISITOR IS ALLOWED TO COME WITH YOU AND STAY IN THE WAITING ROOM ONLY DURING PRE OP AND PROCEDURE DAY OF SURGERY. THE 1 VISITOR  MAY VISIT WITH YOU AFTER SURGERY IN YOUR PRIVATE ROOM DURING VISITING HOURS ONLY!  YOU NEED TO HAVE A COVID 19 TEST ON__10/2_____ @_11 :45______, THIS TEST MUST BE DONE BEFORE SURGERY,  COVID TESTING SITE [place] [address], IT IS ON THE RIGHT GOING OUT [address] APPROXIMATELY  2 MINUTES PAST [employer] ON THE RIGHT. ONCE YOUR COVID TEST IS COMPLETED,  PLEASE BEGIN THE QUARANTINE INSTRUCTIONS AS OUTLINED IN YOUR HANDOUT.                [name]   Your procedure is scheduled on: 02/29/20   Report to [facility] Main  Entrance   Report to admitting at   1:00 PM     Call this number if you have problems the morning of surgery [phone]    Remember: Do not eat food After Midnight  You may have clear liquids until 9:00 am    CLEAR LIQUID DIET   Foods Allowed                                                                     Foods Excluded  Coffee and tea, regular and decaf                             liquids that you cannot  Plain Jell-O any favor except red or purple                                           see through such as: Fruit ices (not with fruit pulp)                                     milk, soups, orange juice  Iced Popsicles                                    All solid food Carbonated beverages, regular and diet                                    Cranberry, grape and apple juices Sports drinks like Gatorade Lightly seasoned clear broth or consume(fat free) Sugar, honey syrup      . BRUSH YOUR TEETH MORNING OF SURGERY AND RINSE YOUR MOUTH OUT, NO CHEWING GUM CANDY OR MINTS.     Take these medicines the morning of surgery with A SIP OF WATER: Wellbutrin, Pantoprazole, Metoprolol, Amlodipine, Levothyroxine             Use your inhalers and bring them  with you to the hospital  You may not have any metal on your body including hair pins and              piercings  Do not wear jewelry, make-up, lotions, powders or perfumes, deodorant             Do not wear nail polish on your fingernails.  Do not shave  48 hours prior to surgery.              Do not bring valuables to the hospital. [place].  Contacts, dentures or bridgework may not be worn into surgery.       Patients discharged the day of surgery will not be allowed to drive home.   IF YOU ARE HAVING SURGERY AND GOING HOME THE SAME DAY, YOU MUST HAVE AN ADULT TO DRIVE YOU HOME AND BE WITH YOU FOR 24 HOURS.   YOU MAY GO HOME BY TAXI OR UBER OR ORTHERWISE, BUT AN ADULT MUST ACCOMPANY YOU HOME AND STAY WITH YOU FOR 24 HOURS.  Name and phone number of your driver:  Special Instructions: N/A              Please read over the following fact sheets you were given: _____________________________________________________________________             [facility] - Preparing for Surgery Before surgery, you can play an important role.   Because skin is not sterile, your skin needs to be as free of germs as possible.   You can reduce the number of germs on your skin by washing with CHG (chlorahexidine gluconate) soap before surgery .  CHG is an antiseptic cleaner which kills germs and bonds with the skin to continue killing germs even after washing. Please DO NOT use if you have an allergy to CHG or antibacterial soaps.   If your skin becomes reddened/irritated stop using the CHG and inform your nurse when you arrive at Short Stay. Do not shave (including legs and underarms) for at least 48 hours prior to the first CHG shower.   Please follow these instructions carefully:  1.  Shower with CHG Soap the night before surgery and the  morning of Surgery.  2.  If you choose to wash your hair, wash your hair first  as usual with your  normal  shampoo.  3.  After you shampoo, rinse your hair and body thoroughly to remove the  shampoo.                                        4.  Use CHG as you would any other liquid soap.  You can apply chg directly  to the skin and wash                       Gently with a scrungie or clean washcloth.  5.  Apply the CHG Soap to your body ONLY FROM THE NECK DOWN.   Do not use on face/ open                           Wound or open sores. Avoid contact with eyes, ears mouth and genitals (private parts).  Wash face,  Genitals (private parts) with your normal soap.             6.  Wash thoroughly, paying special attention to the area where your surgery  will be performed.  7.  Thoroughly rinse your body with warm water from the neck down.  8.  DO NOT shower/wash with your normal soap after using and rinsing off  the CHG Soap.             9.  Pat yourself dry with a clean towel.            10.  Wear clean pajamas.            11.  Place clean sheets on your bed the night of your first shower and do not  sleep with pets. Day of Surgery : Do not apply any lotions/deodorants the morning of surgery.  Please wear clean clothes to the hospital/surgery center.  FAILURE TO FOLLOW THESE INSTRUCTIONS MAY RESULT IN THE CANCELLATION OF YOUR SURGERY PATIENT SIGNATURE_________________________________  NURSE SIGNATURE__________________________________  ________________________________________________________________________

## 2020-02-27 ENCOUNTER — Encounter (HOSPITAL_COMMUNITY): Payer: Self-pay

## 2020-02-27 ENCOUNTER — Other Ambulatory Visit: Payer: Self-pay

## 2020-02-27 ENCOUNTER — Other Ambulatory Visit (HOSPITAL_COMMUNITY)
Admission: RE | Admit: 2020-02-27 | Discharge: 2020-02-27 | Disposition: A | Discharge status: Home / Self Care | Payer: PPO | Source: Ambulatory Visit | Attending: Urology | Admitting: Urology

## 2020-02-27 ENCOUNTER — Encounter (HOSPITAL_COMMUNITY)
Admission: RE | Admit: 2020-02-27 | Discharge: 2020-02-27 | Disposition: A | Discharge status: Home / Self Care | Payer: PPO | Source: Ambulatory Visit | Attending: Urology | Admitting: Urology

## 2020-02-27 DIAGNOSIS — I10 Essential (primary) hypertension: Secondary | ICD-10-CM | POA: Diagnosis not present

## 2020-02-27 DIAGNOSIS — Z01818 Encounter for other preprocedural examination: Secondary | ICD-10-CM | POA: Insufficient documentation

## 2020-02-27 DIAGNOSIS — Z20822 Contact with and (suspected) exposure to covid-19: Secondary | ICD-10-CM | POA: Diagnosis not present

## 2020-02-27 HISTORY — DX: Personal history of urinary calculi: Z87.442

## 2020-02-27 HISTORY — DX: Atherosclerotic heart disease of native coronary artery without angina pectoris: I25.10

## 2020-02-27 HISTORY — DX: Anxiety disorder, unspecified: F41.9

## 2020-02-27 HISTORY — DX: Depression, unspecified: F32.A

## 2020-02-27 LAB — CBC
HCT: 45.1 % (ref 36.0–46.0)
Hemoglobin: 14.5 g/dL (ref 12.0–15.0)
MCH: 29.4 pg (ref 26.0–34.0)
MCHC: 32.2 g/dL (ref 30.0–36.0)
MCV: 91.5 fL (ref 80.0–100.0)
Platelets: 253 10*3/uL (ref 150–400)
RBC: 4.93 MIL/uL (ref 3.87–5.11)
RDW: 14.2 % (ref 11.5–15.5)
WBC: 6.4 10*3/uL (ref 4.0–10.5)
nRBC: 0 % (ref 0.0–0.2)

## 2020-02-27 LAB — BASIC METABOLIC PANEL
Anion gap: 11 (ref 5–15)
BUN: 24 mg/dL — ABNORMAL HIGH (ref 8–23)
CO2: 29 mmol/L (ref 22–32)
Calcium: 9.5 mg/dL (ref 8.9–10.3)
Chloride: 101 mmol/L (ref 98–111)
Creatinine, Ser: 1.13 mg/dL — ABNORMAL HIGH (ref 0.44–1.00)
GFR calc Af Amer: 52 mL/min — ABNORMAL LOW (ref >60)
GFR calc non Af Amer: 45 mL/min — ABNORMAL LOW (ref >60)
Glucose, Bld: 102 mg/dL — ABNORMAL HIGH (ref 70–99)
Potassium: 4.7 mmol/L (ref 3.5–5.1)
Sodium: 141 mmol/L (ref 135–145)

## 2020-02-27 LAB — SARS CORONAVIRUS 2 (TAT 6-24 HRS): SARS Coronavirus 2: NEGATIVE

## 2020-02-27 NOTE — Progress Notes (Signed)
COVID Vaccine Completed:Yes Date COVID Vaccine completed:08/05/19 COVID vaccine manufacture  Moderna    PCP - Dr. Lenetta Quaker Cardiologist - no  Chest x-ray - no EKG - 02/27/20-Chart Stress Test - no ECHO - 12/19/19-Epic Cardiac Cath - no Pacemaker/ICD device last checked:NA  Sleep Study - yes Pt says she is boarder line narcolepsy CPAP - no  Fasting Blood Sugar - NA Checks Blood Sugar _____ times a day  Blood Thinner Instructions:Coumadin/ Dr. Nevada Crane Aspirin Instructions:Stop 6 days prior Last Dose:02/23/20  Anesthesia review:   Patient denies shortness of breath, fever, cough and chest pain at PAT appointment Yes   Patient verbalized understanding of instructions that were given to them at the PAT appointment. Patient was also instructed that they will need to review over the PAT instructions again at home before surgery. Yes  Pt reports that she has had a TIA after surgery and has had a seizure when waking up. She is unable to lay flat.  She is allergic to Epinephrine.

## 2020-02-28 NOTE — Anesthesia Preprocedure Evaluation (Addendum)
Anesthesia Evaluation  Patient identified by MRN, date of birth, ID band Patient awake    Reviewed: Allergy & Precautions, NPO status , Patient's Chart, lab work & pertinent test results  History of Anesthesia Complications (+) PONV, DIFFICULT AIRWAY and history of anesthetic complications  Airway Mallampati: II  TM Distance: >3 FB Neck ROM: Full  Mouth opening: Limited Mouth Opening  Dental no notable dental hx. (+) Teeth Intact, Dental Advisory Given   Pulmonary neg pulmonary ROS,    Pulmonary exam normal breath sounds clear to auscultation       Cardiovascular hypertension, Pt. on medications and Pt. on home beta blockers Normal cardiovascular exam Rhythm:Regular Rate:Normal  EKG SB  R 43 w LVH  11/2018 echo . The left ventricle has normal systolic function with an ejection  fraction of 60-65%. The cavity size was normal. There is mildly increased  left ventricular wall thickness. Left ventricular diastolic Doppler  parameters are consistent with  pseudonormalization.    Neuro/Psych Depression Off coumadin since 9/29 CVA, No Residual Symptoms    GI/Hepatic GERD  Controlled,  Endo/Other  Hypothyroidism   Renal/GU Renal InsufficiencyRenal diseaseNephrolitiasis  K+ 4.7 Cr 1.13     Musculoskeletal  (+) Arthritis ,   Abdominal   Peds  Hematology   Anesthesia Other Findings ALL_ meperidine, vicodin  Reproductive/Obstetrics                           Anesthesia Physical Anesthesia Plan  ASA: III  Anesthesia Plan: General   Post-op Pain Management:    Induction: Intravenous  PONV Risk Score and Plan: Treatment may vary due to age or medical condition, Ondansetron and TIVA  Airway Management Planned: LMA  Additional Equipment: None  Intra-op Plan:   Post-operative Plan:   Informed Consent:     Dental advisory given  Plan Discussed with: CRNA and  Anesthesiologist  Anesthesia Plan Comments: (See PAT note 02/27/20, Konrad Felix, PA-C  "Pt with h/o thoracic vertebral fractures s/p kyphoplasty 08/23/2018.  She reports difficulty lying flat.    Per anesthesia notes 06/13/16, "Difficult Airway- due to limited oral opening and Difficult Airway- due to reduced neck mobility"  Successful intubation with 1 attempt. IV induction, Laryngoscope Size: Miller and 3, Tube type: Oral, Tube size: 7.0 mm")      Anesthesia Quick Evaluation

## 2020-02-28 NOTE — Progress Notes (Signed)
Anesthesia Chart Review   Case: [id number] Date/Time: 02/29/20 1445   Procedure: CYSTOSCOPY/URETEROSCOPY/HOLMIUM LASER/STENT PLACEMENT (Left )   Anesthesia type: General   Pre-op diagnosis: LEFT RENAL STONE, LEFT URETEROPELVIC JUNCTION OBSTRUCTION   Location: [facility]OM 03 / [facility]   Surgeons: Ceasar Mons, MD      DISCUSSION:84 y.o. never smoker with h/o PONV, HTN, GERD, hypothyroidism, CVA (on Coumadin, advised to hold 6 days prior to procedure by PCP), narcolepsy, left renal stone, left ureteropelvic junction obstruction scheduled for above procedure 02/29/2020 with Dr. Nadeen Landau.   Pt evaluated by cardiology in 2019 due to episode of chest pain.  Negative stress test.  Echo 12/18/2018 with EF 60-65%, no valvular problems.    Last seen by PCP, Dr. Nevada Crane, 11/2019.    Pt with h/o thoracic vertebral fractures s/p kyphoplasty 08/23/2018.  She reports difficulty lying flat.    Per anesthesia notes 06/13/16, "Difficult Airway- due to limited oral opening and Difficult Airway- due to reduced neck mobility"  Successful intubation with 1 attempt.  VS: BP (!) 157/72   Pulse (!) 58   Temp 37.5 C   Resp 20   Ht 5' (1.524 m)   Wt 59.4 kg   SpO2 98%   BMI 25.58 kg/m   PROVIDERS: Celene Squibb, MD   LABS: Labs reviewed: Acceptable for surgery. (all labs ordered are listed, but only abnormal results are displayed)  Labs Reviewed  BASIC METABOLIC PANEL - Abnormal; Notable for the following components:      Result Value   Glucose, Bld 102 (*)    BUN 24 (*)    Creatinine, Ser 1.13 (*)    GFR calc non Af Amer 45 (*)    GFR calc Af Amer 52 (*)    All other components within normal limits  CBC     IMAGES:   EKG: 02/27/20 Rate 56 Sinus bradycardia Left axis deviation Minimal voltage criteria for LVH, may be normal variant ( R in aVL ) Abnormal ECG  CV: Echo 12/18/18 IMPRESSIONS    1. The left ventricle has normal systolic function with an ejection  fraction of  60-65%. The cavity size was normal. There is mildly increased  left ventricular wall thickness. Left ventricular diastolic Doppler  parameters are consistent with  pseudonormalization.  2. The right ventricle has normal systolic function. The cavity was  normal. There is no increase in right ventricular wall thickness. Right  ventricular systolic pressure is normal with an estimated pressure of 28.4  mmHg.  3. Left atrial size was mildly dilated.  4. The mitral valve is grossly normal. Mild calcification of the mitral  valve leaflet. There is moderate mitral annular calcification present.  There is mild mitral regurgitation.  5. The tricuspid valve is grossly normal.  6. The aortic valve has an indeterminate number of cusps. Moderate  calcification of the aortic valve.  7. The aorta is normal in size and structure. Past Medical History:  Diagnosis Date  . Adenomatous colon polyp 2008  . Anxiety   . Asthma    has not needed in over a year  . Complication of anesthesia    pt reports TIA and sezures after surgery  . Coronary artery disease   . CVA (cerebral vascular accident) ([place])    x 3  . Degenerative disc disease, lumbar   . Depression   . Diverticulosis   . GERD (gastroesophageal reflux disease)   . 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36   age 52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 2 weeks  . Hemorrhoids 2007  . History of kidney stones   . HTN (hypertension)   . Hyperplastic colon polyp 2005  . Hypothyroidism   . Narcolepsy   . PONV (postoperative nausea and vomiting)   . TIA (transient ischemic attack)     Past Surgical History:  Procedure Laterality Date  . ABDOMINAL HYSTERECTOMY     age 68, complicated by poor wound healing, followed by revision of scar and radiation for ?malignancy  . BACK SURGERY  07/2018   lost 2 inches  . BREAST ENHANCEMENT SURGERY    . BREAST IMPLANT EXCHANGE Right 06/23/2016   Procedure: RIGHT BREAST IMPLANT REMOVAL AND REPLACEMENT;  Surgeon: Cristine Polio, MD;   Location: [facility];  Service: Plastics;  Laterality: Right;  . COLONOSCOPY  2005   2 hyperplastic polyps  . COLONOSCOPY  2007   Dr. Oneida Alar- hemorrhoids  . COLONOSCOPY  2008   Dr. Reed Pandy polyp- rare sigmoid diverticulosis, internal hemorrhoids  . COLONOSCOPY  12/01/2011   Procedure: COLONOSCOPY;  Surgeon: Danie Binder, MD;  [facility];  Service: Endoscopy;  Laterality: N/A;  12:30 PM  . epidural steroid injection     She is getting injections with Dr. Nelva Bush q3wks.  . ESOPHAGOGASTRODUODENOSCOPY (EGD) WITH ESOPHAGEAL DILATION N/A 04/15/2013   Procedure: ESOPHAGOGASTRODUODENOSCOPY (EGD) WITH ESOPHAGEAL DILATION;  Surgeon: Danie Binder, MD;  [facility];  Service: Endoscopy;  Laterality: N/A;  11:45-moved to 12:30 Darius Bump to notify pt  . EYE SURGERY  2010  . EYE SURGERY    . IR KYPHO LUMBAR INC FX REDUCE BONE BX UNI/BIL CANNULATION INC/IMAGING  08/23/2018  . IR RADIOLOGIST EVAL & MGMT  12/23/2018  . right thumb surgery      MEDICATIONS: . albuterol (PROVENTIL) (2.5 MG/3ML) 0.083% nebulizer solution  . albuterol (VENTOLIN HFA) 108 (90 Base) MCG/ACT inhaler  . amLODipine (NORVASC) 5 MG tablet  . Ascorbic Acid (VITAMIN C) 1000 MG tablet  . B Complex Vitamins (VITAMIN B COMPLEX) TABS  . Biotin 300 MCG TABS  . buPROPion (WELLBUTRIN XL) 150 MG 24 hr tablet  . buPROPion (WELLBUTRIN) 100 MG tablet  . Calcium Carb-Cholecalciferol (CALCIUM 600 + D PO)  . Calcium Carbonate 500 MG CHEW  . cholecalciferol (VITAMIN D) 1000 UNITS tablet  . conjugated estrogens (PREMARIN) vaginal cream  . cyanocobalamin 100 MCG tablet  . denosumab (PROLIA) 60 MG/ML SOSY injection  . ezetimibe (ZETIA) 10 MG tablet  . ezetimibe-simvastatin (VYTORIN) 10-10 MG tablet  . Ferrous Gluconate (IRON) 240 (27 Fe) MG TABS  . Flaxseed, Linseed, (FLAXSEED OIL) 1000 MG CAPS  . furosemide (LASIX) 40 MG tablet  . Lecithin 1200 MG CAPS  . levothyroxine (SYNTHROID, LEVOTHROID)  50 MCG tablet  . Lutein 40 MG CAPS  . Magnesium 250 MG TABS  . metoprolol tartrate (LOPRESSOR) 25 MG tablet  . NON FORMULARY  . Omega-3 Fatty Acids (FISH OIL) 1200 MG CAPS  . pantoprazole (PROTONIX) 40 MG tablet  . polyethylene glycol (MIRALAX / GLYCOLAX) 17 g packet  . potassium chloride (K-DUR) 10 MEQ tablet  . potassium chloride (KLOR-CON) 10 MEQ tablet  . Turmeric 500 MG TABS  . Vibegron (GEMTESA) 75 MG TABS  . vitamin E 180 MG (400 UNITS) capsule  . warfarin (COUMADIN) 4 MG tablet  . Zinc 50 MG TABS   No current facility-administered medications for this encounter.    Konrad Felix, PA-C [facility] [phone]

## 2020-02-29 ENCOUNTER — Ambulatory Visit (HOSPITAL_COMMUNITY): Payer: PPO

## 2020-02-29 ENCOUNTER — Ambulatory Visit (HOSPITAL_COMMUNITY): Payer: PPO | Admitting: Anesthesiology

## 2020-02-29 ENCOUNTER — Ambulatory Visit (HOSPITAL_COMMUNITY): Payer: PPO | Admitting: Physician Assistant

## 2020-02-29 ENCOUNTER — Ambulatory Visit (HOSPITAL_COMMUNITY)
Admission: RE | Admit: 2020-02-29 | Discharge: 2020-02-29 | Disposition: A | Discharge status: Home / Self Care | Payer: PPO | Attending: Urology | Admitting: Urology

## 2020-02-29 ENCOUNTER — Encounter (HOSPITAL_COMMUNITY): Payer: Self-pay | Admitting: Urology

## 2020-02-29 ENCOUNTER — Encounter (HOSPITAL_COMMUNITY)
Admission: RE | Disposition: A | Discharge status: Home / Self Care | Payer: Self-pay | Source: Home / Self Care | Attending: Urology

## 2020-02-29 DIAGNOSIS — N132 Hydronephrosis with renal and ureteral calculous obstruction: Secondary | ICD-10-CM | POA: Diagnosis not present

## 2020-02-29 DIAGNOSIS — Z8673 Personal history of transient ischemic attack (TIA), and cerebral infarction without residual deficits: Secondary | ICD-10-CM | POA: Diagnosis not present

## 2020-02-29 DIAGNOSIS — I10 Essential (primary) hypertension: Secondary | ICD-10-CM | POA: Diagnosis not present

## 2020-02-29 DIAGNOSIS — Z923 Personal history of irradiation: Secondary | ICD-10-CM | POA: Diagnosis not present

## 2020-02-29 DIAGNOSIS — I2510 Atherosclerotic heart disease of native coronary artery without angina pectoris: Secondary | ICD-10-CM | POA: Diagnosis not present

## 2020-02-29 DIAGNOSIS — Z87442 Personal history of urinary calculi: Secondary | ICD-10-CM | POA: Insufficient documentation

## 2020-02-29 DIAGNOSIS — N200 Calculus of kidney: Secondary | ICD-10-CM | POA: Diagnosis not present

## 2020-02-29 DIAGNOSIS — E039 Hypothyroidism, unspecified: Secondary | ICD-10-CM | POA: Diagnosis not present

## 2020-02-29 DIAGNOSIS — I110 Hypertensive heart disease with heart failure: Secondary | ICD-10-CM | POA: Diagnosis not present

## 2020-02-29 DIAGNOSIS — I5032 Chronic diastolic (congestive) heart failure: Secondary | ICD-10-CM | POA: Diagnosis not present

## 2020-02-29 HISTORY — PX: CYSTOSCOPY/URETEROSCOPY/HOLMIUM LASER/STENT PLACEMENT: SHX6546

## 2020-02-29 SURGERY — CYSTOSCOPY/URETEROSCOPY/HOLMIUM LASER/STENT PLACEMENT
Anesthesia: General | Site: Ureter | Laterality: Left

## 2020-02-29 MED ORDER — PROPOFOL 10 MG/ML IV BOLUS
INTRAVENOUS | Status: AC
  Filled 2020-02-29: qty 20

## 2020-02-29 MED ORDER — CEFAZOLIN SODIUM-DEXTROSE 2-4 GM/100ML-% IV SOLN
INTRAVENOUS | Status: AC
  Filled 2020-02-29: qty 100

## 2020-02-29 MED ORDER — ACETAMINOPHEN 10 MG/ML IV SOLN
1000.0000 mg | Freq: Once | INTRAVENOUS | Status: DC | PRN
  Administered 2020-02-29: 1000 mg via INTRAVENOUS

## 2020-02-29 MED ORDER — SODIUM CHLORIDE 0.9 % IR SOLN
Status: DC | PRN
  Administered 2020-02-29: 3000 mL

## 2020-02-29 MED ORDER — FENTANYL CITRATE (PF) 100 MCG/2ML IJ SOLN
INTRAMUSCULAR | Status: AC
Start: 2020-02-29 — End: ?
  Filled 2020-02-29: qty 2

## 2020-02-29 MED ORDER — ONDANSETRON HCL 4 MG/2ML IJ SOLN: 4.0000 mg | Freq: Once | INTRAMUSCULAR | Status: DC | PRN

## 2020-02-29 MED ORDER — LACTATED RINGERS IV SOLN: INTRAVENOUS | Status: DC

## 2020-02-29 MED ORDER — CEFAZOLIN SODIUM-DEXTROSE 2-4 GM/100ML-% IV SOLN
2.0000 g | Freq: Once | INTRAVENOUS | Status: AC
  Administered 2020-02-29: 2 g via INTRAVENOUS

## 2020-02-29 MED ORDER — CEPHALEXIN 500 MG PO CAPS: 500.0000 mg | ORAL_CAPSULE | Freq: Two times a day (BID) | ORAL | 0 refills | Status: AC

## 2020-02-29 MED ORDER — FENTANYL CITRATE (PF) 100 MCG/2ML IJ SOLN
25.0000 ug | INTRAMUSCULAR | Status: DC | PRN
  Administered 2020-02-29: 25 ug via INTRAVENOUS

## 2020-02-29 MED ORDER — CHLORHEXIDINE GLUCONATE 0.12 % MT SOLN
15.0000 mL | Freq: Once | OROMUCOSAL | Status: AC
  Administered 2020-02-29: 15 mL via OROMUCOSAL

## 2020-02-29 MED ORDER — FENTANYL CITRATE (PF) 100 MCG/2ML IJ SOLN
INTRAMUSCULAR | Status: DC | PRN
  Administered 2020-02-29 (×2): 25 ug via INTRAVENOUS

## 2020-02-29 MED ORDER — OXYBUTYNIN CHLORIDE 5 MG PO TABS: 5.0000 mg | ORAL_TABLET | Freq: Three times a day (TID) | ORAL | 1 refills | Status: DC | PRN

## 2020-02-29 MED ORDER — PROPOFOL 10 MG/ML IV BOLUS
INTRAVENOUS | Status: DC | PRN
  Administered 2020-02-29: 30 mg via INTRAVENOUS
  Administered 2020-02-29: 20 mg via INTRAVENOUS
  Administered 2020-02-29: 60 mg via INTRAVENOUS
  Administered 2020-02-29: 20 mg via INTRAVENOUS
  Administered 2020-02-29: 90 mg via INTRAVENOUS

## 2020-02-29 MED ORDER — LIDOCAINE 2% (20 MG/ML) 5 ML SYRINGE
INTRAMUSCULAR | Status: DC | PRN
  Administered 2020-02-29: 70 mg via INTRAVENOUS

## 2020-02-29 MED ORDER — ONDANSETRON HCL 4 MG/2ML IJ SOLN
INTRAMUSCULAR | Status: DC | PRN
  Administered 2020-02-29: 4 mg via INTRAVENOUS

## 2020-02-29 MED ORDER — EPHEDRINE 5 MG/ML INJ
INTRAVENOUS | Status: AC
  Filled 2020-02-29: qty 10

## 2020-02-29 MED ORDER — ACETAMINOPHEN 10 MG/ML IV SOLN
INTRAVENOUS | Status: AC
  Filled 2020-02-29: qty 100

## 2020-02-29 MED ORDER — ORAL CARE MOUTH RINSE: 15.0000 mL | Freq: Once | OROMUCOSAL | Status: AC

## 2020-02-29 MED ORDER — PROPOFOL 1000 MG/100ML IV EMUL
INTRAVENOUS | Status: AC
  Filled 2020-02-29: qty 100

## 2020-02-29 MED ORDER — LIDOCAINE 2% (20 MG/ML) 5 ML SYRINGE
INTRAMUSCULAR | Status: AC
  Filled 2020-02-29: qty 5

## 2020-02-29 MED ORDER — ONDANSETRON HCL 4 MG/2ML IJ SOLN
INTRAMUSCULAR | Status: AC
  Filled 2020-02-29: qty 2

## 2020-02-29 MED ORDER — IOHEXOL 300 MG/ML  SOLN
INTRAMUSCULAR | Status: DC | PRN
  Administered 2020-02-29: 9 mL

## 2020-02-29 MED ORDER — EPHEDRINE SULFATE-NACL 50-0.9 MG/10ML-% IV SOSY
PREFILLED_SYRINGE | INTRAVENOUS | Status: DC | PRN
  Administered 2020-02-29: 5 mg via INTRAVENOUS

## 2020-02-29 MED ORDER — PHENAZOPYRIDINE HCL 200 MG PO TABS: 200.0000 mg | ORAL_TABLET | Freq: Three times a day (TID) | ORAL | 0 refills | Status: DC | PRN

## 2020-02-29 MED ORDER — PROPOFOL 500 MG/50ML IV EMUL
INTRAVENOUS | Status: DC | PRN
  Administered 2020-02-29: 100 ug/kg/min via INTRAVENOUS

## 2020-02-29 MED ORDER — FENTANYL CITRATE (PF) 100 MCG/2ML IJ SOLN
INTRAMUSCULAR | Status: DC
Start: 2020-02-29 — End: 2020-02-29
  Filled 2020-02-29: qty 2

## 2020-02-29 SURGICAL SUPPLY — 21 items
BAG URO CATCHER STRL LF (MISCELLANEOUS) ×3 IMPLANT
BASKET ZERO TIP NITINOL 2.4FR (BASKET) IMPLANT
CATH URET 5FR 28IN OPEN ENDED (CATHETERS) ×3 IMPLANT
CLOTH BEACON ORANGE TIMEOUT ST (SAFETY) ×3 IMPLANT
EXTRACTOR STONE NITINOL NGAGE (UROLOGICAL SUPPLIES) IMPLANT
FIBER LASER MOSES 200 DFL (Laser) ×3 IMPLANT
GLOVE BIOGEL M STRL SZ7.5 (GLOVE) ×3 IMPLANT
GOWN STRL REUS W/TWL XL LVL3 (GOWN DISPOSABLE) ×3 IMPLANT
GUIDEWIRE STR DUAL SENSOR (WIRE) ×3 IMPLANT
GUIDEWIRE ZIPWRE .038 STRAIGHT (WIRE) ×6 IMPLANT
KIT TURNOVER KIT A (KITS) ×3 IMPLANT
LASER FIB FLEXIVA PULSE ID 365 (Laser) IMPLANT
MANIFOLD NEPTUNE II (INSTRUMENTS) ×3 IMPLANT
PACK CYSTO (CUSTOM PROCEDURE TRAY) ×3 IMPLANT
SHEATH URETERAL 12FRX35CM (MISCELLANEOUS) ×3 IMPLANT
STENT URET 6FRX26 CONTOUR (STENTS) ×3 IMPLANT
TRACTIP FLEXIVA PULS ID 200XHI (Laser) IMPLANT
TRACTIP FLEXIVA PULSE ID 200 (Laser)
TUBING CONNECTING 10 (TUBING) ×2 IMPLANT
TUBING CONNECTING 10' (TUBING) ×1
TUBING UROLOGY SET (TUBING) ×3 IMPLANT

## 2020-02-29 NOTE — Anesthesia Procedure Notes (Signed)
Procedure Name: LMA Insertion Date/Time: 02/29/2020 2:00 PM Performed by: Niel Hummer, CRNA Pre-anesthesia Checklist: Patient identified, Emergency Drugs available, Suction available and Patient being monitored Patient Re-evaluated:Patient Re-evaluated prior to induction Oxygen Delivery Method: Circle system utilized Preoxygenation: Pre-oxygenation with 100% oxygen Induction Type: IV induction LMA: LMA inserted LMA Size: 4.0 Number of attempts: 2 Dental Injury: Teeth and Oropharynx as per pre-operative assessment  Comments: LMA 3 placed, unable to seat. Removed and LMA 4 placed.

## 2020-02-29 NOTE — Transfer of Care (Signed)
Immediate Anesthesia Transfer of Care Note  Patient: [name]  Procedure(s) Performed: CYSTOSCOPY/URETEROSCOPY/HOLMIUM LASER/STENT PLACEMENT (Left Ureter)  Patient Location: PACU  Anesthesia Type:General  Level of Consciousness: awake, alert  and oriented  Airway & Oxygen Therapy: Patient Spontanous Breathing and Patient connected to face mask oxygen  Post-op Assessment: Report given to RN, Post -op Vital signs reviewed and stable and Patient moving all extremities X 4  Post vital signs: Reviewed and stable  Last Vitals:  Vitals Value Taken Time  BP 142/65 02/29/20 1449  Temp    Pulse 65 02/29/20 1451  Resp 9 02/29/20 1451  SpO2 100 % 02/29/20 1451  Vitals shown include unvalidated device data.  Last Pain:  Vitals:   02/29/20 1249  TempSrc: Oral         Complications: No complications documented.

## 2020-02-29 NOTE — Anesthesia Postprocedure Evaluation (Signed)
Anesthesia Post Note  Patient: [name]  Procedure(s) Performed: CYSTOSCOPY/URETEROSCOPY/HOLMIUM LASER/STENT PLACEMENT (Left Ureter)     Patient location during evaluation: PACU Anesthesia Type: General Level of consciousness: awake and alert Pain management: pain level controlled Vital Signs Assessment: post-procedure vital signs reviewed and stable Respiratory status: spontaneous breathing, nonlabored ventilation, respiratory function stable and patient connected to nasal cannula oxygen Cardiovascular status: blood pressure returned to baseline and stable Postop Assessment: no apparent nausea or vomiting Anesthetic complications: no   No complications documented.  Last Vitals:  Vitals:   02/29/20 1500 02/29/20 1515  BP: (!) 141/59 (!) 151/63  Pulse: 63 68  Resp: 12 14  Temp:  (!) 36 C  SpO2: 100% 100%    Last Pain:  Vitals:   02/29/20 1515  TempSrc:   PainSc: [place]

## 2020-02-29 NOTE — Op Note (Signed)
Operative Note  Preoperative diagnosis:  1. 6 mm left renal stone 2. Left UPJ obstruction with high insertion of the ureteropelvic junction  Postoperative diagnosis: 1. 6 mm left renal stone 2. Left UPJ obstruction with high insertion of the ureteropelvic junction  Procedure(s): 1. Cystoscopy with left ureteroscopy, holmium laser lithotripsy and left JJ stent placement 2. Left retrograde pyelogram with intraoperative interpretation of fluoroscopic imaging  Surgeon: Ellison Hughs, MD  Assistants:  None  Anesthesia:  General  Complications:  None  EBL: Less than 5 ml  Specimens: 1. None  Drains/Catheters: 1. Left 8 French, 24 cm JJ stent without tether  Intraoperative findings:   1. Stenotic left ureteropelvic junction with high insertion 2. Left retrograde pyelogram revealed a stenotic left ureteropelvic junction with high insertion.  Uniform dilation of the left renal pelvis and its associated calyces with no other filling defects. There were no filling defects along the entire length of the left ureter.  Indication:  [name] is a 84 y.o. female with a history of a left-sided UPJ obstruction, left-sided flank pain as well as a nonobstructing left renal stone. She has been consented for the above procedures, voices understanding wishes to proceed.  Description of procedure:  After informed consent was obtained, the patient was brought to the operating room and general LMA anesthesia was administered. The patient was then placed in the dorsolithotomy position and prepped and draped in the usual sterile fashion. A timeout was performed. A 23 French rigid cystoscope was then inserted into the urethral meatus and advanced into the bladder under direct vision. A complete bladder survey revealed no intravesical pathology.  A 5 French ureteral catheter was then inserted into the left ureteral orifice and a retrograde pyelogram was obtained, with the findings listed  above.  A Glidewire was then used to intubate the lumen of the ureteral catheter and was advanced up to the left renal pelvis, under fluoroscopic guidance.  The catheter was then removed, leaving the wire in place. An additional sensor wire was then advanced up the left ureter and into the left renal pelvis, confirming placement via fluoroscopy. A 14 French ureteral access sheath was then advanced over the sensor wire and into position within the proximal aspects of the left ureter. A flexible ureteroscope was then advanced through the access sheath and into the proximal left ureter.  The left UPJ was able to easily accommodate the flexible ureteroscope, which was then navigated into the left renal pelvis. A 6 mm stone was identified and subsequently dusted with a 200 m laser. There were no other stones within the left renal pelvis. The flexible ureteroscope and ureteral access sheath were then removed under direct vision, revealing no evidence of ureteral trauma. An 8 [place], 24 cm JJ stent was then placed over the wire and into good position within the left collecting system, confirming placement via fluoroscopy. The patient's bladder was then drained. She tolerated the procedure well and was transferred to the postanesthesia in stable condition.  Plan: Follow-up in 1 week for office cystoscopy and stent removal

## 2020-02-29 NOTE — H&P (Signed)
Urology Preoperative H&P   Chief Complaint: Kidney stones  History of Present Illness: [name] is a 84 y.o. female with a history of kidney stones and a left sided UPJ obstruction.   Last serum creatinine-1.05 (12/18/2019)   Lasix renogram from 07/02/2018 showed a differential function of 42% left vs 58% right.  T1/2 post LASIK:  Left kidney will sign 18.3 minutes  Right kidney was signed 7.1 minutes   CT stone study from 12/06/2019 was obtained due to hematuria and worsening right-sided back pain. The scan showed stable left-sided hydronephrosis, but a persistent 6 mm renal pelvis stone. She was also noted to have nonobstructing right renal calculi.   The patient reports persistent right-sided lower back pain (hx of T12 compression fracture), but denies left-sided flank pain, dysuria or interval episodes of hematuria or UTIs. UA today is unremarkable.   -hx of CVA x3  -hx of prior radiation-- for non-healing incision/cancerous lesion following hysterectomy  -planning on right hip surgery in the near future    Past Medical History:  Diagnosis Date  . Adenomatous colon polyp 2008  . Anxiety   . Asthma    has not needed in over a year  . Complication of anesthesia    pt reports TIA and sezures after surgery  . Coronary artery disease   . CVA (cerebral vascular accident) ([place])    x 3  . Degenerative disc disease, lumbar   . Depression   . Diverticulosis   . GERD (gastroesophageal reflux disease)   . 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60   age 32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 2 weeks  . Hemorrhoids 2007  . History of kidney stones   . HTN (hypertension)   . Hyperplastic colon polyp 2005  . Hypothyroidism   . Narcolepsy   . PONV (postoperative nausea and vomiting)   . TIA (transient ischemic attack)     Past Surgical History:  Procedure Laterality Date  . ABDOMINAL HYSTERECTOMY     age 29, complicated by poor wound healing, followed by revision of scar and radiation for ?malignancy  .  BACK SURGERY  07/2018   lost 2 inches  . BREAST ENHANCEMENT SURGERY    . BREAST IMPLANT EXCHANGE Right 06/23/2016   Procedure: RIGHT BREAST IMPLANT REMOVAL AND REPLACEMENT;  Surgeon: Cristine Polio, MD;  Location: [facility];  Service: Plastics;  Laterality: Right;  . COLONOSCOPY  2005   2 hyperplastic polyps  . COLONOSCOPY  2007   Dr. Oneida Alar- hemorrhoids  . COLONOSCOPY  2008   Dr. Reed Pandy polyp- rare sigmoid diverticulosis, internal hemorrhoids  . COLONOSCOPY  12/01/2011   Procedure: COLONOSCOPY;  Surgeon: Danie Binder, MD;  [facility];  Service: Endoscopy;  Laterality: N/A;  12:30 PM  . epidural steroid injection     She is getting injections with Dr. Nelva Bush q3wks.  . ESOPHAGOGASTRODUODENOSCOPY (EGD) WITH ESOPHAGEAL DILATION N/A 04/15/2013   Procedure: ESOPHAGOGASTRODUODENOSCOPY (EGD) WITH ESOPHAGEAL DILATION;  Surgeon: Danie Binder, MD;  [facility];  Service: Endoscopy;  Laterality: N/A;  11:45-moved to 12:30 Darius Bump to notify pt  . EYE SURGERY  2010  . EYE SURGERY    . IR KYPHO LUMBAR INC FX REDUCE BONE BX UNI/BIL CANNULATION INC/IMAGING  08/23/2018  . IR RADIOLOGIST EVAL & MGMT  12/23/2018  . right thumb surgery      Allergies:  Allergies  Allergen Reactions  . Epinephrine Anaphylaxis  . Demerol [Meperidine] Other (See Comments)    headaches  .  Vicodin [Hydrocodone-Acetaminophen] Other (See Comments)    [name] it makes her crazy. Pt tolerates acetaminophen    Family History  Problem Relation Age of Onset  . Colon cancer Father        age 32  . Prostate cancer Father   . Pancreatic cancer Mother        age 60    Social History:  reports that she has never smoked. She has never used smokeless tobacco. She reports that she does not drink alcohol and does not use drugs.  ROS: A complete review of systems was performed.  All systems are negative except for pertinent findings as noted.  Physical Exam:  Vital signs in  last 24 hours: Temp:  [98.1 F (36.7 C)] 98.1 F (36.7 C) (10/06 1249) Pulse Rate:  [68] 68 (10/06 1249) Resp:  [16] 16 (10/06 1249) BP: (172)/(72) 172/72 (10/06 1249) SpO2:  [99 %] 99 % (10/06 1249) Constitutional:  Alert and oriented, No acute distress Cardiovascular: Regular rate and rhythm, No JVD Respiratory: Normal respiratory effort, Lungs clear bilaterally GI: Abdomen is soft, nontender, nondistended, no abdominal masses GU: No CVA tenderness Lymphatic: No lymphadenopathy Neurologic: Grossly intact, no focal deficits Psychiatric: Normal mood and affect  Laboratory Data:  Recent Labs    02/27/20 1107  WBC 6.4  HGB 14.5  HCT 45.1  PLT 253    Recent Labs    02/27/20 1107  NA 141  K 4.7  CL 101  GLUCOSE 102*  BUN 24*  CALCIUM 9.5  CREATININE 1.13*     No results found for this or any previous visit (from the past 24 hour(s)). Recent Results (from the past 240 hour(s))  SARS CORONAVIRUS 2 (TAT 6-24 HRS) Nasopharyngeal Nasopharyngeal Swab     Status: None   Collection Time: 02/27/20 11:56 AM   Specimen: Nasopharyngeal Swab  Result Value Ref Range Status   SARS Coronavirus 2 NEGATIVE NEGATIVE Final    Comment: (NOTE) SARS-CoV-2 target nucleic acids are NOT DETECTED.  The SARS-CoV-2 RNA is generally detectable in upper and lower respiratory specimens during the acute phase of infection. Negative results do not preclude SARS-CoV-2 infection, do not rule out co-infections with other pathogens, and should not be used as the sole basis for treatment or other patient management decisions. Negative results must be combined with clinical observations, patient history, and epidemiological information. The expected result is Negative.  Fact Sheet for Patients: SugarRoll.be  Fact Sheet for Healthcare Providers: https://www.woods-mathews.com/  This test is not yet approved or cleared by the [place] FDA and  has been  authorized for detection and/or diagnosis of SARS-CoV-2 by FDA under an Emergency Use Authorization (EUA). This EUA will remain  in effect (meaning this test can be used) for the duration of the COVID-19 declaration under Se ction 564(b)(1) of the Act, 21 U.S.C. section 360bbb-3(b)(1), unless the authorization is terminated or revoked sooner.  Performed at [facility], [place] [address], [place], [place] [address]     Renal Function: Recent Labs    02/27/20 1107  CREATININE 1.13*   Estimated Creatinine Clearance: 31 mL/min (A) (by C-G formula based on SCr of 1.13 mg/dL (H)).  Radiologic Imaging: CLINICAL DATA: Hematuria, history of kidney stones,, history of  left partial UPJ obstruction, back pain worse on the right   EXAM:  CT ABDOMEN AND PELVIS WITHOUT CONTRAST   TECHNIQUE:  Multidetector CT imaging of the abdomen and pelvis was performed  following the standard protocol without IV contrast.  COMPARISON: 12/21/2018   FINDINGS:  Lower chest: No acute abnormality. Cardiomegaly and coronary artery  calcifications.   Hepatobiliary: Multiple low-attenuation liver lesions, unchanged  compared to prior examination, incompletely characterized although  likely cysts or hemangiomata. No gallstones, gallbladder wall  thickening, or biliary dilatation.   Pancreas: Unremarkable. No pancreatic ductal dilatation or  surrounding inflammatory changes.   Spleen: Normal in size without significant abnormality.   Adrenals/Urinary Tract: Adrenal glands are unremarkable. Exophytic  hemorrhagic cyst of the inferior pole of the right kidney. Tiny  nonobstructive right renal calculi. Redemonstrated moderate left  hydronephrosis with an enlarged renal pelvis and abrupt caliber  change of the left ureter at the ureteropelvic junction. A  previously seen 6 mm calyceal calculus is now located dependently in  the left renal pelvis (series 2, image 28). Bladder is unremarkable.    Stomach/Bowel: Stomach is within normal limits. Appendix is not  clearly visualized and may be surgically absent. No evidence of  bowel wall thickening, distention, or inflammatory changes. Sigmoid  diverticulosis.   Vascular/Lymphatic: Aortic atherosclerosis. No enlarged abdominal or  pelvic lymph nodes.   Reproductive: Status post hysterectomy   Other: No abdominal wall hernia or abnormality. No abdominopelvic  ascites.   Musculoskeletal: No acute or significant osseous findings.   IMPRESSION:  1. Redemonstrated moderate left hydronephrosis with an enlarged  renal pelvis and abrupt caliber change of the left ureter at the  ureteropelvic junction, in keeping with stricture.  2. A previously seen 6 mm calyceal calculus is now located  dependently in the left renal pelvis.  3. Tiny nonobstructive right renal calculi.  4. Aortic Atherosclerosis (ICD10-I70.0).    Electronically Signed  By: Eddie Candle M.D.  On: 12/06/2019 17:02   ____________________________________________________________________________  CLINICAL DATA: Progressive hydronephrosis with LEFT UPJ obstruction   EXAM:  NUCLEAR MEDICINE RENAL SCAN WITH DIURETIC ADMINISTRATION   TECHNIQUE:  Radionuclide angiographic and sequential renal images were obtained  after intravenous injection of radiopharmaceutical. Imaging was  continued during slow intravenous injection of Lasix approximately  15 minutes after the start of the examination.   RADIOPHARMACEUTICALS: 5.08 mCi Technetium-35m MAG3 IV   Lasix: 28.8 mg IV   COMPARISON: CT abdomen and pelvis 12/06/2019   FINDINGS:  Flow: Symmetric blood flow to both kidneys.   Left renogram: Normal uptake, concentration and excretion of tracer  into a dilated LEFT renal collecting system. Minimal clearance of  tracer prior to Lasix, accelerating following Lasix administration.  No significant residual tracer at the conclusion of the exam.  Analysis of the renogram  curve demonstrates a normal time to peak  activity of 4.2 minutes with fall to half maximum activity 25.3  minutes later.   Right renogram: Normal uptake, concentration and excretion of tracer  by RIGHT kidney. Good clearance before and continuing following  Lasix administration. No abnormal retention of tracer at the  conclusion of the exam. Analysis of the renogram curve demonstrates  a normal time to peak activity of 4.2 minutes with a fall to half  maximum activity at 10.5 minutes.   Differential:   Left kidney = 42 %   Right kidney = 58 %   T1/2 post Lasix :   Left kidney = 13.8 min   Right kidney = 11.8 min   IMPRESSION:  Dilated LEFT renal collecting system.   Good clearance of tracer from LEFT kidney following diuretic  administration without evidence of urinary outflow obstruction.   Normal RIGHT renogram.    Electronically Signed  By: Elta Guadeloupe  Thornton Papas M.D.  On: 12/16/2019 13:33  I independently reviewed the above imaging studies.  Assessment and Plan [name] is a 84 y.o. female with a left-sided UPJ obstruction as well as an enlarging left renal stone  -The patient and I extensively discussed the treatment options of her left-sided UPJ obstruction as well as her left renal stone. The options include observation, robotic pyeloplasty with stone extraction or ureteroscopy with laser lithotripsy and stent placement. At the conclusion of our discussion, the patient has elected to proceed with ureteroscopy to address her left renal stone and would like to defer surgical repair of her UPJ obstruction at this time.  -The risks, benefits and alternatives of cystoscopy with LEFT ureteroscopy, laser lithotripsy and ureteral stent placement was discussed the patient. Risks included, but are not limited to: bleeding, urinary tract infection, ureteral injury/avulsion, ureteral stricture formation, retained stone fragments, the possibility that multiple surgeries may be  required to treat the stone(s), MI, stroke, PE and the inherent risks of general anesthesia. The patient voices understanding and wishes to proceed.    Ellison Hughs, MD 02/29/2020, 1:22 PM  [facility] Pager: [phone]

## 2020-03-01 ENCOUNTER — Encounter (HOSPITAL_COMMUNITY): Payer: Self-pay | Admitting: Urology

## 2020-03-05 ENCOUNTER — Encounter (HOSPITAL_COMMUNITY): Payer: Self-pay | Admitting: Urology

## 2020-03-06 DIAGNOSIS — M25551 Pain in right hip: Secondary | ICD-10-CM | POA: Diagnosis not present

## 2020-03-06 DIAGNOSIS — Z7901 Long term (current) use of anticoagulants: Secondary | ICD-10-CM | POA: Diagnosis not present

## 2020-03-06 DIAGNOSIS — N200 Calculus of kidney: Secondary | ICD-10-CM | POA: Diagnosis not present

## 2020-03-08 DIAGNOSIS — N200 Calculus of kidney: Secondary | ICD-10-CM | POA: Diagnosis not present

## 2020-03-08 DIAGNOSIS — Q6239 Other obstructive defects of renal pelvis and ureter: Secondary | ICD-10-CM | POA: Diagnosis not present

## 2020-03-20 DIAGNOSIS — Z7901 Long term (current) use of anticoagulants: Secondary | ICD-10-CM | POA: Diagnosis not present

## 2020-03-20 DIAGNOSIS — M25551 Pain in right hip: Secondary | ICD-10-CM | POA: Diagnosis not present

## 2020-03-20 DIAGNOSIS — N200 Calculus of kidney: Secondary | ICD-10-CM | POA: Diagnosis not present

## 2020-03-20 DIAGNOSIS — G3184 Mild cognitive impairment, so stated: Secondary | ICD-10-CM | POA: Diagnosis not present

## 2020-03-20 DIAGNOSIS — Z23 Encounter for immunization: Secondary | ICD-10-CM | POA: Diagnosis not present

## 2020-03-22 IMAGING — MR MR ABDOMEN WO/W CM MRCP
11 of 20 series · 21 of 48 positions shown · IV contrast (7ml Gadavist)
Comparison: Ultrasound on 02/05/2018 and CT on 01/15/2015

CLINICAL DATA: Upper abdominal pain for 11 months. Biliary
dilatation recent ultrasound.

EXAM:
MRI ABDOMEN WITHOUT AND WITH CONTRAST (INCLUDING MRCP)
TECHNIQUE: Multiplanar multisequence MR imaging of the abdomen was performed
both before and after the administration of intravenous contrast.
Heavily T2-weighted images of the biliary and pancreatic ducts were
obtained, and three-dimensional MRCP images were rendered by post
processing.
CONTRAST:  7 mL Gadavist

[Series 4: T2 · coronal · 5.0mm · 1.02mm/px · 1 of 36 slices shown (1 of 3)]
[im 1/36]
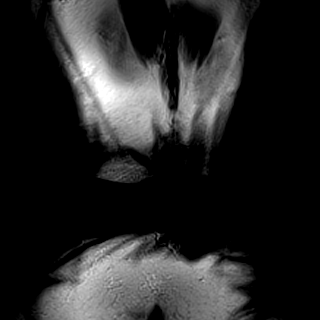

[Series 5: t2fs axial blade · axial · 4.0mm · 1.16mm/px · 1 of 48 slices shown]
[im 1/48]
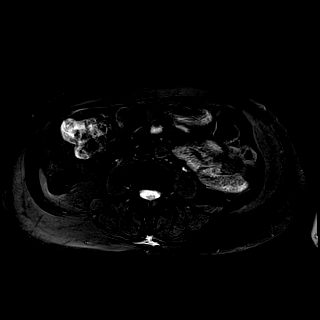

[Series 8: T2 · coronal · 1.0mm · 0.42mm/px · 3 of 104 slices shown (2 of 3)]
[im 1/104]
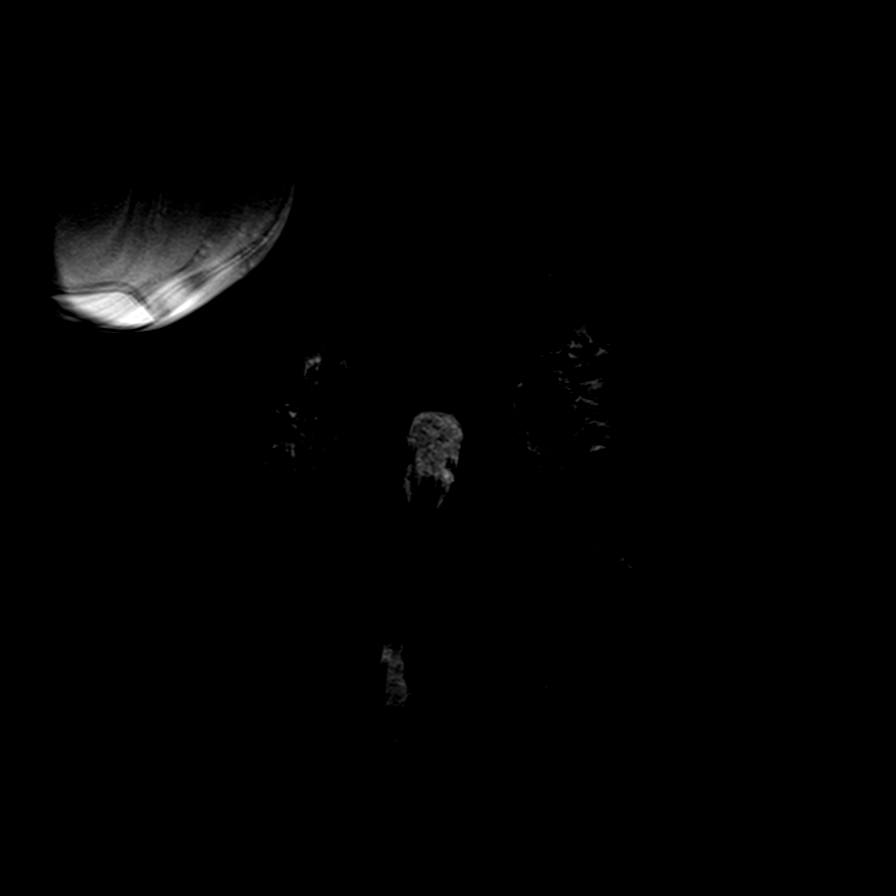
[im 52/104]
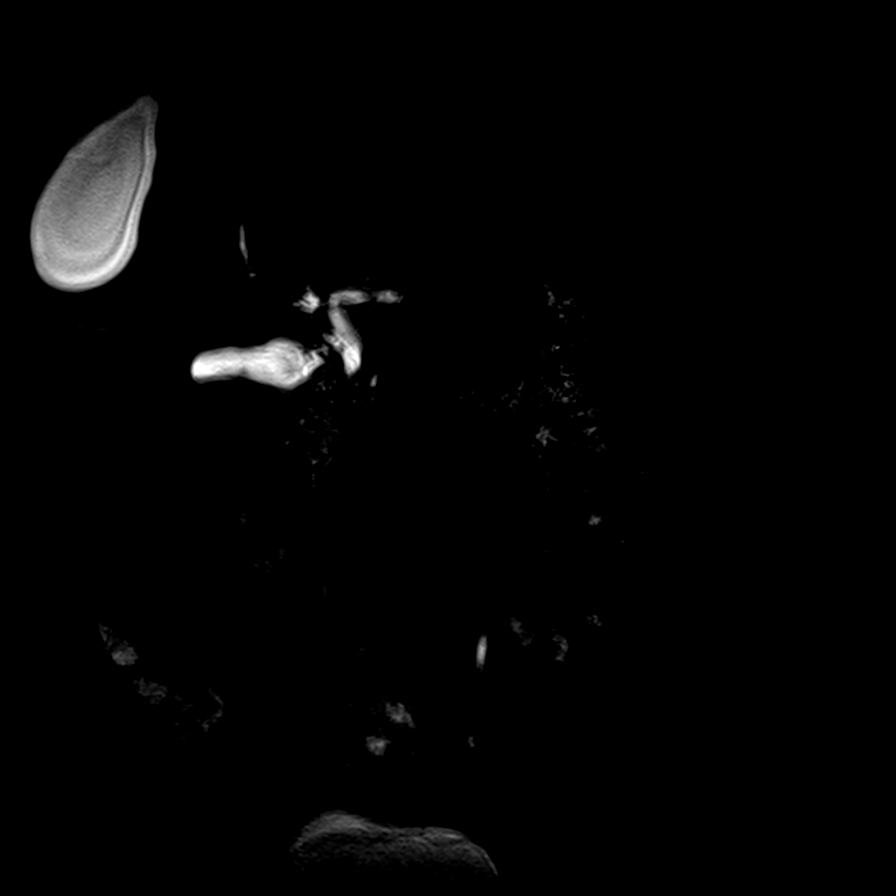
[im 104/104]
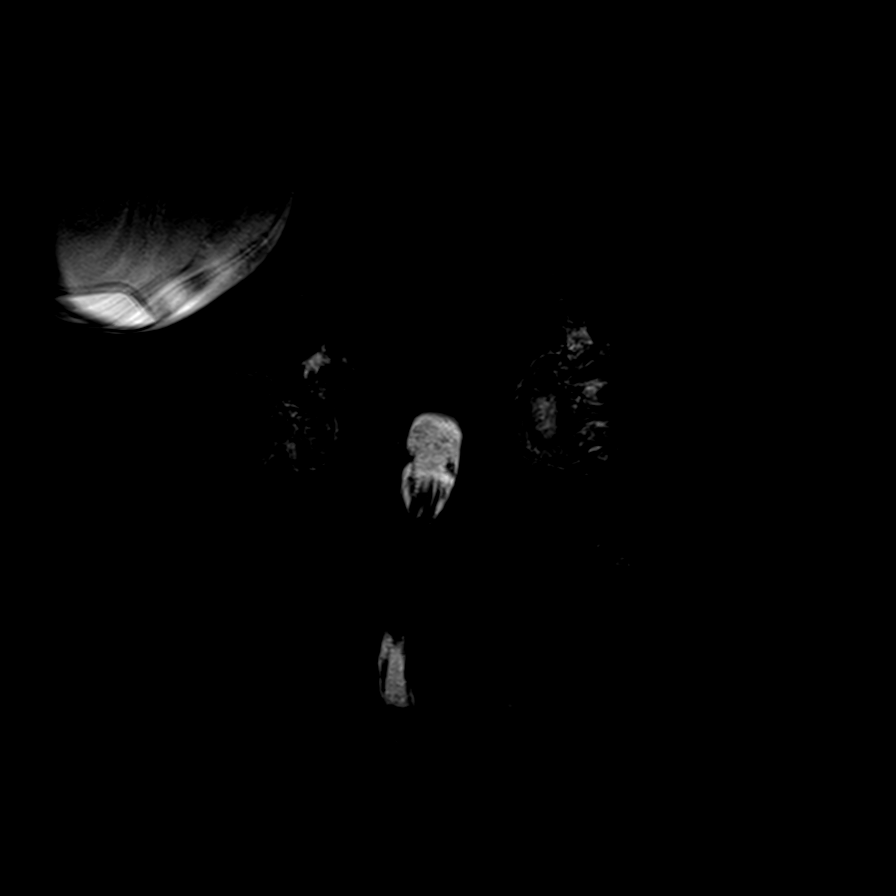

[Series 10: MRCP · coronal · 4.0mm · 0.78mm/px · 1 of 17 slices shown]
[im 1/17]
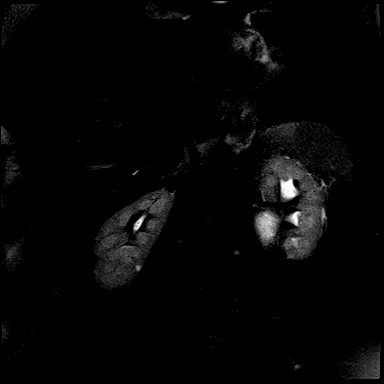

[Series 11: DWI · axial · 5.0mm · 0.99mm/px · z∈[+8,+241]mm · 2 of 80 slices shown (1 of 2)]
[im 1/80]
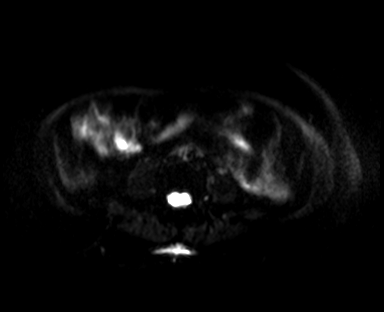
[im 80/80]
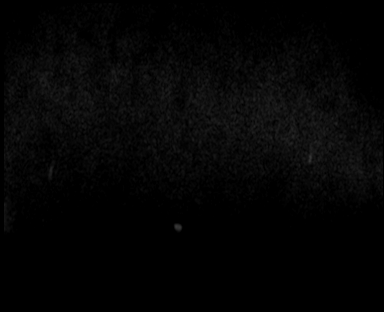

[Series 12: DWI · axial · 5.0mm · 0.99mm/px · 1 of 40 slices shown (2 of 2)]
[im 1/40]
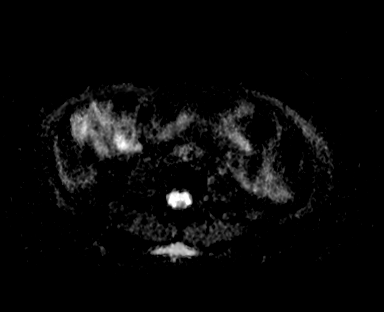

[Series 14: bSSFP · axial · 4.0mm · 1.37mm/px · z∈[-14,+262]mm · 3 of 70 slices shown]
[im 1/70]
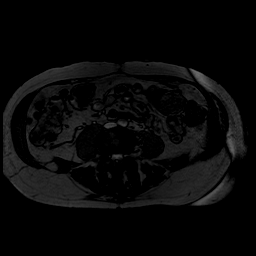
[im 35/70]
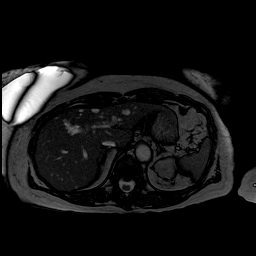
[im 70/70]
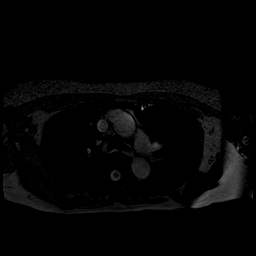

[Series 15: T1 · axial · 4.0mm · 0.59mm/px · z∈[-44,+208]mm · 2 of 64 slices shown (1 of 2)]
[im 1/64]
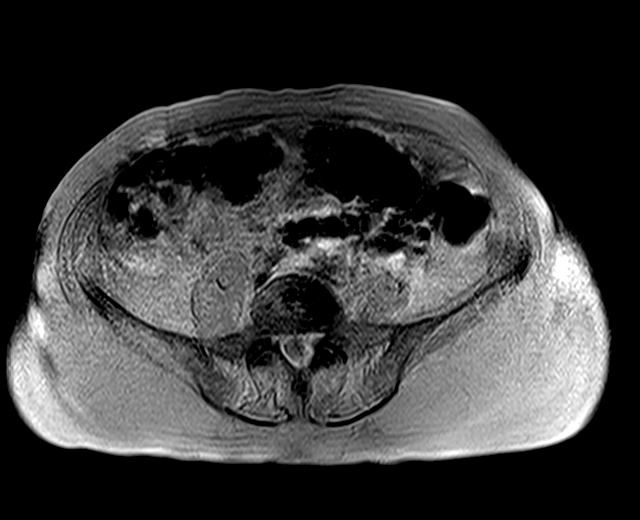
[im 64/64]
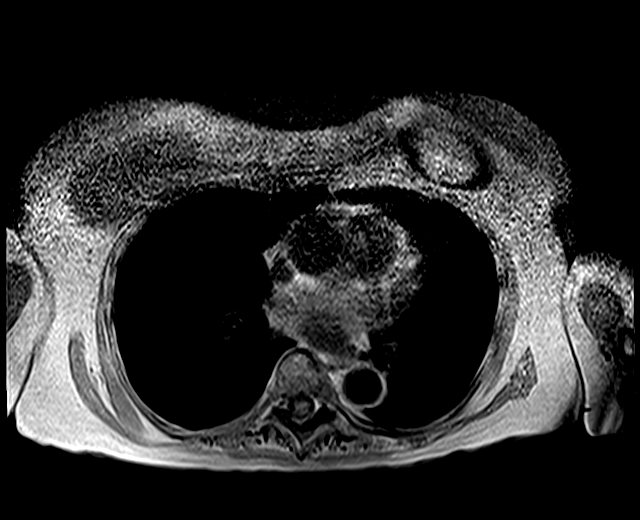

[Series 16: T1 · axial · 4.0mm · 0.59mm/px · z∈[-44,+208]mm · 2 of 64 slices shown (2 of 2)]
[im 1/64]
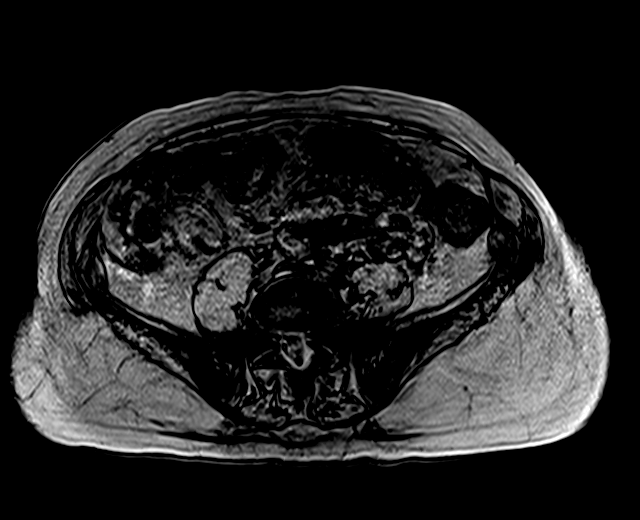
[im 64/64]
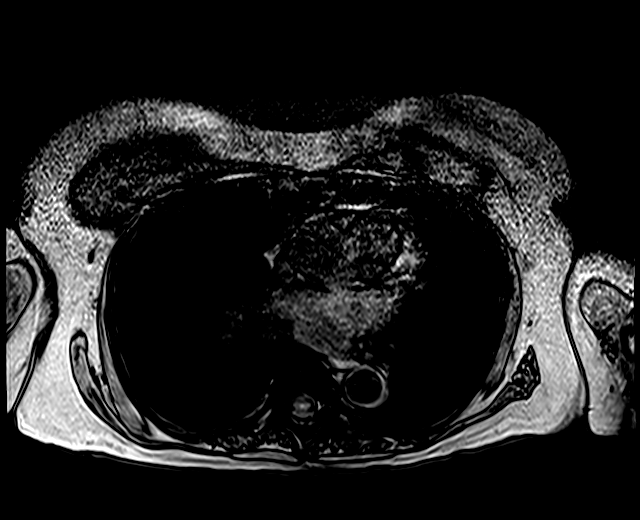

[Series 27: t1fs coronal post · coronal · 2.5mm · 1.11mm/px · 4 of 96 slices shown]
[im 1/96]
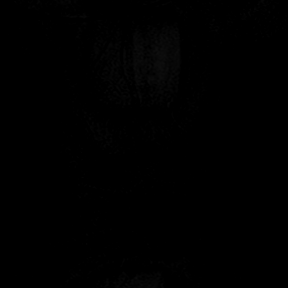
[im 32/96]
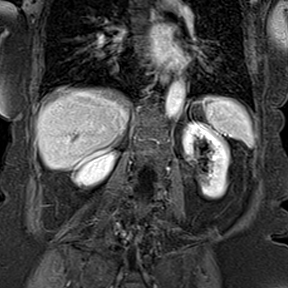
[im 64/96]
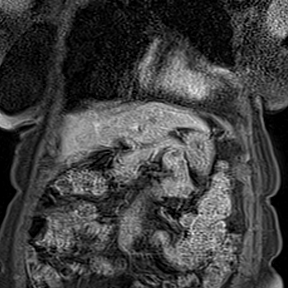
[im 96/96]
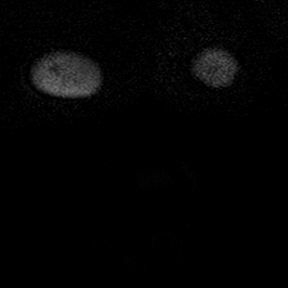

[Series 28: T2 · axial · 5.0mm · 1.48mm/px · 1 of 40 slices shown (3 of 3)]
[im 1/40]
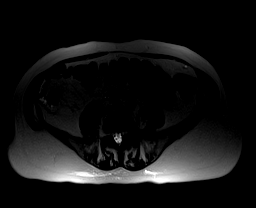

[21 of 48 positions shown; findings below may reference images not displayed]

FINDINGS: Lower chest: No acute findings.

Hepatobiliary: A few small benign cysts are seen in the left lobe. A
2 cm benign hemangioma is also seen in the posterior right hepatic
lobe.

Gallbladder is unremarkable. Mild dilatation of common bile duct is
seen measuring 9 mm. This shows no significant change compared to
previous CT in 5898. No evidence of choledocholithiasis.

Pancreas: No mass or inflammatory changes. No evidence of pancreatic
ductal dilatation or pancreas divisum.

Spleen:  Within normal limits in size and appearance.

Adrenals/Urinary Tract: No masses identified. Moderate left renal
pelvicaliectasis shows no significant change. No evidence of
ureterectasis. This is suspicious for a partial left UPJ
obstruction.

Stomach/Bowel: Visualized portion unremarkable.

Vascular/Lymphatic: No pathologically enlarged lymph nodes
identified. No abdominal aortic aneurysm.

Other:  None.

Musculoskeletal:  No suspicious bone lesions identified.
IMPRESSION: 2 cm benign hemangioma in the posterior right hepatic lobe. Other
tiny left hepatic lobe cysts also noted. No evidence of malignancy.

Mild biliary ductal dilatation, without significant change compared
to previous CT in 5898. no radiographic evidence of
choledocholithiasis or other obstructing etiology.

Stable moderate left renal pelvicaliectasis, without ureterectasis.
Partial left UPJ obstruction cannot be excluded. Consider nuclear
medicine renal scan with Lasix administration if clinically
warranted.

## 2020-04-05 DIAGNOSIS — J069 Acute upper respiratory infection, unspecified: Secondary | ICD-10-CM | POA: Diagnosis not present

## 2020-04-11 DIAGNOSIS — G3184 Mild cognitive impairment, so stated: Secondary | ICD-10-CM | POA: Diagnosis not present

## 2020-04-11 DIAGNOSIS — M79604 Pain in right leg: Secondary | ICD-10-CM | POA: Diagnosis not present

## 2020-04-11 DIAGNOSIS — N200 Calculus of kidney: Secondary | ICD-10-CM | POA: Diagnosis not present

## 2020-04-11 DIAGNOSIS — J069 Acute upper respiratory infection, unspecified: Secondary | ICD-10-CM | POA: Diagnosis not present

## 2020-04-11 DIAGNOSIS — Z7901 Long term (current) use of anticoagulants: Secondary | ICD-10-CM | POA: Diagnosis not present

## 2020-04-11 DIAGNOSIS — M25551 Pain in right hip: Secondary | ICD-10-CM | POA: Diagnosis not present

## 2020-04-26 DIAGNOSIS — N200 Calculus of kidney: Secondary | ICD-10-CM | POA: Diagnosis not present

## 2020-04-26 DIAGNOSIS — Q6239 Other obstructive defects of renal pelvis and ureter: Secondary | ICD-10-CM | POA: Diagnosis not present

## 2020-05-08 DIAGNOSIS — M79604 Pain in right leg: Secondary | ICD-10-CM | POA: Diagnosis not present

## 2020-05-08 DIAGNOSIS — K2100 Gastro-esophageal reflux disease with esophagitis, without bleeding: Secondary | ICD-10-CM | POA: Diagnosis not present

## 2020-05-08 DIAGNOSIS — G3184 Mild cognitive impairment, so stated: Secondary | ICD-10-CM | POA: Diagnosis not present

## 2020-05-08 DIAGNOSIS — N200 Calculus of kidney: Secondary | ICD-10-CM | POA: Diagnosis not present

## 2020-05-08 DIAGNOSIS — M25551 Pain in right hip: Secondary | ICD-10-CM | POA: Diagnosis not present

## 2020-05-08 DIAGNOSIS — Z7901 Long term (current) use of anticoagulants: Secondary | ICD-10-CM | POA: Diagnosis not present

## 2020-05-08 DIAGNOSIS — J069 Acute upper respiratory infection, unspecified: Secondary | ICD-10-CM | POA: Diagnosis not present

## 2020-05-12 ENCOUNTER — Emergency Department (HOSPITAL_COMMUNITY): Payer: PPO

## 2020-05-12 ENCOUNTER — Emergency Department (HOSPITAL_COMMUNITY)
Admission: EM | Admit: 2020-05-12 | Discharge: 2020-05-12 | Disposition: A | Discharge status: Home / Self Care | Payer: PPO | Attending: Emergency Medicine | Admitting: Emergency Medicine

## 2020-05-12 ENCOUNTER — Other Ambulatory Visit: Payer: Self-pay

## 2020-05-12 ENCOUNTER — Encounter (HOSPITAL_COMMUNITY): Payer: Self-pay | Admitting: Emergency Medicine

## 2020-05-12 DIAGNOSIS — I5020 Unspecified systolic (congestive) heart failure: Secondary | ICD-10-CM | POA: Insufficient documentation

## 2020-05-12 DIAGNOSIS — Z79899 Other long term (current) drug therapy: Secondary | ICD-10-CM | POA: Diagnosis not present

## 2020-05-12 DIAGNOSIS — Z955 Presence of coronary angioplasty implant and graft: Secondary | ICD-10-CM | POA: Diagnosis not present

## 2020-05-12 DIAGNOSIS — I2510 Atherosclerotic heart disease of native coronary artery without angina pectoris: Secondary | ICD-10-CM | POA: Insufficient documentation

## 2020-05-12 DIAGNOSIS — E039 Hypothyroidism, unspecified: Secondary | ICD-10-CM | POA: Insufficient documentation

## 2020-05-12 DIAGNOSIS — R079 Chest pain, unspecified: Secondary | ICD-10-CM | POA: Diagnosis not present

## 2020-05-12 DIAGNOSIS — I110 Hypertensive heart disease with heart failure: Secondary | ICD-10-CM | POA: Diagnosis not present

## 2020-05-12 DIAGNOSIS — Z7901 Long term (current) use of anticoagulants: Secondary | ICD-10-CM | POA: Diagnosis not present

## 2020-05-12 DIAGNOSIS — R0789 Other chest pain: Secondary | ICD-10-CM | POA: Diagnosis present

## 2020-05-12 DIAGNOSIS — R0602 Shortness of breath: Secondary | ICD-10-CM | POA: Diagnosis not present

## 2020-05-12 DIAGNOSIS — J45909 Unspecified asthma, uncomplicated: Secondary | ICD-10-CM | POA: Diagnosis not present

## 2020-05-12 DIAGNOSIS — M79661 Pain in right lower leg: Secondary | ICD-10-CM | POA: Diagnosis not present

## 2020-05-12 DIAGNOSIS — Z853 Personal history of malignant neoplasm of breast: Secondary | ICD-10-CM | POA: Diagnosis not present

## 2020-05-12 LAB — TROPONIN I (HIGH SENSITIVITY)
Troponin I (High Sensitivity): 24 ng/L — ABNORMAL HIGH (ref <18)
Troponin I (High Sensitivity): 23 ng/L — ABNORMAL HIGH (ref <18)

## 2020-05-12 LAB — BRAIN NATRIURETIC PEPTIDE: B Natriuretic Peptide: 267 pg/mL — ABNORMAL HIGH (ref 0.0–100.0)

## 2020-05-12 LAB — BASIC METABOLIC PANEL
Anion gap: 9 (ref 5–15)
BUN: 27 mg/dL — ABNORMAL HIGH (ref 8–23)
CO2: 29 mmol/L (ref 22–32)
Calcium: 9.3 mg/dL (ref 8.9–10.3)
Chloride: 99 mmol/L (ref 98–111)
Creatinine, Ser: 1.03 mg/dL — ABNORMAL HIGH (ref 0.44–1.00)
GFR, Estimated: 54 mL/min — ABNORMAL LOW (ref >60)
Glucose, Bld: 108 mg/dL — ABNORMAL HIGH (ref 70–99)
Potassium: 3.6 mmol/L (ref 3.5–5.1)
Sodium: 137 mmol/L (ref 135–145)

## 2020-05-12 LAB — DIFFERENTIAL
Abs Immature Granulocytes: 0.02 10*3/uL (ref 0.00–0.07)
Basophils Absolute: 0.1 10*3/uL (ref 0.0–0.1)
Basophils Relative: 1 %
Eosinophils Absolute: 0.3 10*3/uL (ref 0.0–0.5)
Eosinophils Relative: 4 %
Immature Granulocytes: 0 %
Lymphocytes Relative: 39 %
Lymphs Abs: 2.9 10*3/uL (ref 0.7–4.0)
Monocytes Absolute: 0.8 10*3/uL (ref 0.1–1.0)
Monocytes Relative: 11 %
Neutro Abs: 3.3 10*3/uL (ref 1.7–7.7)
Neutrophils Relative %: 45 %

## 2020-05-12 LAB — HEPATIC FUNCTION PANEL
ALT: 21 U/L (ref 0–44)
AST: 29 U/L (ref 15–41)
Albumin: 4.4 g/dL (ref 3.5–5.0)
Alkaline Phosphatase: 53 U/L (ref 38–126)
Bilirubin, Direct: <0.1 mg/dL (ref 0.0–0.2)
Total Bilirubin: 0.5 mg/dL (ref 0.3–1.2)
Total Protein: 7.2 g/dL (ref 6.5–8.1)

## 2020-05-12 LAB — CBC
HCT: 41.7 % (ref 36.0–46.0)
Hemoglobin: 13.3 g/dL (ref 12.0–15.0)
MCH: 29.2 pg (ref 26.0–34.0)
MCHC: 31.9 g/dL (ref 30.0–36.0)
MCV: 91.4 fL (ref 80.0–100.0)
Platelets: 258 10*3/uL (ref 150–400)
RBC: 4.56 MIL/uL (ref 3.87–5.11)
RDW: 14.2 % (ref 11.5–15.5)
WBC: 7.4 10*3/uL (ref 4.0–10.5)
nRBC: 0 % (ref 0.0–0.2)

## 2020-05-12 MED ORDER — FUROSEMIDE 10 MG/ML IJ SOLN
40.0000 mg | Freq: Once | INTRAMUSCULAR | Status: AC
  Administered 2020-05-12: 21:00:00 40 mg via INTRAVENOUS
  Filled 2020-05-12: qty 4

## 2020-05-12 NOTE — ED Triage Notes (Signed)
Pt c/o chest pain that began this morning. Pt also states she has bilateral edema in her legs.

## 2020-05-12 NOTE — ED Notes (Signed)
Pt c/o right lateral lower leg pain and reporting she was sent here by the doctor to be evaluated for possible blood clot. Dr. Roderic Palau made aware.

## 2020-05-12 NOTE — ED Provider Notes (Signed)
[facility] EMERGENCY DEPARTMENT Provider Note   CSN: [id number] Arrival date & time: 05/12/20  1600     History Chief Complaint  Patient presents with   Chest Pain    [name] is a 84 y.o. female.  Patient states that she has been having pain in her legs she had some chest discomfort she has extra swelling in her legs. Patient has a history of congestive heart failure  The history is provided by the patient and medical records. No language interpreter was used.  Chest Pain Pain location:  Unable to specify Pain quality: aching   Pain radiates to:  Does not radiate Pain severity:  Mild Onset quality:  Sudden Timing:  Rare Progression:  Resolved Chronicity:  New Context: not breathing   Relieved by:  Nothing Worsened by:  Nothing Ineffective treatments:  None tried Associated symptoms: no abdominal pain, no back pain, no cough, no fatigue and no headache        Past Medical History:  Diagnosis Date   Adenomatous colon polyp 2008   Anxiety    Asthma    has not needed in over a year   Complication of anesthesia    pt reports TIA and sezures after surgery   Coronary artery disease    CVA (cerebral vascular accident) ([place])    x 3   Degenerative disc disease, lumbar    Depression    Diverticulosis    GERD (gastroesophageal reflux disease)    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89   age 51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 2 weeks   Hemorrhoids 2007   History of kidney stones    HTN (hypertension)    Hyperplastic colon polyp 2005   Hypothyroidism    Narcolepsy    PONV (postoperative nausea and vomiting)    TIA (transient ischemic attack)     Patient Active Problem List   Diagnosis Date Noted   Urge incontinence 12/09/2019   Mixed incontinence urge and stress (female)(female) 08/05/2019   Renal stones 08/05/2019   UPJ obstruction, 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   Hydronephrosis with ureteropelvic junction (UPJ) obstruction 08/05/2019   Postmenopausal atrophic  vaginitis 08/05/2019   Weak urinary stream 08/05/2019   Paresthesia 12/17/2018   Chronic midline thoracic back pain 12/17/2018   H/O: CVA (cerebrovascular accident) 08/25/2018   Depression, recurrent ([place]) 08/25/2018   T12 compression fracture, with delayed healing, subsequent encounter    Low back pain 08/19/2018   Constipation 08/19/2018   Chronic diastolic CHF (congestive heart failure) ([place]) 08/19/2018   Acquired hypothyroidism 08/19/2018   Intractable pain 08/19/2018   Essential hypertension 08/19/2018   T12 compression fracture ([place]) 08/18/2018   Breast cancer ([place]) 06/23/2016   Change in bowel habits 01/11/2015   Loss of weight 01/11/2015   Abdominal pain, chronic, right upper quadrant 01/11/2015   Unspecified chronic bronchitis ([place]) 09/25/2013   Other dysphagia 04/01/2013   Cough 01/20/2013   Thrush 10/28/2012   Chest pain 09/26/2012   H/O adenomatous polyp of colon 11/18/2011   Chronic anticoagulation 11/18/2011    Past Surgical History:  Procedure Laterality Date   ABDOMINAL HYSTERECTOMY     age 33, complicated by poor wound healing, followed by revision of scar and radiation for ?malignancy   BACK SURGERY  07/2018   lost 2 inches   BREAST ENHANCEMENT SURGERY     BREAST IMPLANT EXCHANGE Right 06/23/2016   Procedure: RIGHT BREAST IMPLANT REMOVAL AND REPLACEMENT;  Surgeon: Cristine Polio, MD;  Location: [facility];  Service: Plastics;  Laterality: Right;   COLONOSCOPY  2005   2 hyperplastic polyps   COLONOSCOPY  2007   Dr. Oneida Alar- hemorrhoids   COLONOSCOPY  2008   Dr. Reed Pandy polyp- rare sigmoid diverticulosis, internal hemorrhoids   COLONOSCOPY  12/01/2011   Procedure: COLONOSCOPY;  Surgeon: Danie Binder, MD;  Location: [facility];  Service: Endoscopy;  Laterality: N/A;  12:30 PM   CYSTOSCOPY/URETEROSCOPY/HOLMIUM LASER/STENT PLACEMENT Left 02/29/2020   Procedure: CYSTOSCOPY/URETEROSCOPY/HOLMIUM  LASER/STENT PLACEMENT;  Surgeon: Ceasar Mons, MD;  Location: [facility];  Service: Urology;  Laterality: Left;   epidural steroid injection     She is getting injections with Dr. Nelva Bush q3wks.   ESOPHAGOGASTRODUODENOSCOPY (EGD) WITH ESOPHAGEAL DILATION N/A 04/15/2013   Procedure: ESOPHAGOGASTRODUODENOSCOPY (EGD) WITH ESOPHAGEAL DILATION;  Surgeon: Danie Binder, MD;  Location: [facility];  Service: Endoscopy;  Laterality: N/A;  11:45-moved to 12:30 Darius Bump to notify pt   EYE SURGERY  2010   EYE SURGERY     IR KYPHO LUMBAR INC FX REDUCE BONE BX UNI/BIL CANNULATION INC/IMAGING  08/23/2018   IR RADIOLOGIST EVAL & MGMT  12/23/2018   right thumb surgery       OB History    Gravida      Para      Term      Preterm      AB      Living  1     SAB      IAB      Ectopic      Multiple      Live Births              Family History  Problem Relation Age of Onset   Colon cancer Father        age 65   Prostate cancer Father    Pancreatic cancer Mother        age 61    Social History   Tobacco Use   Smoking status: Never Smoker   Smokeless tobacco: Never Used  Scientific laboratory technician Use: Never used  Substance Use Topics   Alcohol use: No   Drug use: No    Home Medications Prior to Admission medications   Medication Sig Start Date End Date Taking? Authorizing Provider  albuterol (PROVENTIL) (2.5 MG/3ML) 0.083% nebulizer solution Take 3 mLs (2.5 mg total) by nebulization every 6 (six) hours as needed for wheezing or shortness of breath. 09/08/18  Yes Gerlene Fee, NP  amLODipine (NORVASC) 5 MG tablet Take 1 tablet (5 mg total) by mouth daily. 09/08/18  Yes Gerlene Fee, NP  Ascorbic Acid (VITAMIN C) 1000 MG tablet Take 1,000 mg by mouth daily.   Yes [provider]  B Complex Vitamins (VITAMIN B COMPLEX) TABS Take 1 tablet by mouth daily.   Yes [provider]  Biotin 300 MCG TABS Take 300 mcg by mouth daily after  supper.    Yes [provider]  Calcium Carb-Cholecalciferol (CALCIUM 600 + D PO) Take 1 tablet by mouth in the morning and at bedtime.   Yes [provider]  cyanocobalamin 100 MCG tablet Take 1 tablet by mouth daily.  08/24/18  Yes [provider]  denosumab (PROLIA) 60 MG/ML SOSY injection Inject 60 mg into the skin every 6 (six) months.   Yes [provider]  ezetimibe (ZETIA) 10 MG tablet Take 1 tablet (10 mg total) by mouth daily. 09/08/18  Yes Gerlene Fee, NP  Ferrous Gluconate (IRON)  240 (27 Fe) MG TABS Take 1 tablet by mouth daily. 09/08/18  Yes Gerlene Fee, NP  Flaxseed, Linseed, (FLAXSEED OIL) 1000 MG CAPS Take 1,000 mg by mouth daily.    Yes [provider]  furosemide (LASIX) 40 MG tablet TAKE 1 TABLET BY MOUTH TWICE DAILY Patient taking differently: Take 40 mg by mouth daily. 01/24/19  Yes Herminio Commons, MD  Lecithin 1200 MG CAPS Take 1,200 mg by mouth daily.    Yes [provider]  levothyroxine (SYNTHROID, LEVOTHROID) 50 MCG tablet Take 1 tablet (50 mcg total) by mouth daily. 09/08/18  Yes Gerlene Fee, NP  Lutein 40 MG CAPS Take 40 mg by mouth daily.  08/24/18  Yes [provider]  Magnesium 250 MG TABS Take 250 mg by mouth daily.    Yes [provider]  metoprolol tartrate (LOPRESSOR) 25 MG tablet Take 0.5 tablets (12.5 mg total) by mouth 2 (two) times daily. Take with food / meals Patient taking differently: Take 25 mg by mouth 2 (two) times daily. 12/18/18  Yes Johnson, Clanford L, MD  Omega-3 Fatty Acids (FISH OIL) 1200 MG CAPS Take 1,200 mg by mouth daily.   Yes [provider]  pantoprazole (PROTONIX) 40 MG tablet Take 1 tablet (40 mg total) by mouth daily. 12/18/18  Yes Johnson, Clanford L, MD  potassium chloride (K-DUR) 10 MEQ tablet Take 1 tablet (10 mEq total) by mouth 2 (two) times daily. 09/08/18  Yes Gerlene Fee, NP  Turmeric 500 MG TABS Take 500 mg by mouth daily.    Yes  [provider]  Vibegron (GEMTESA) 75 MG TABS Take 75 mg by mouth daily. 11/11/19  Yes Irine Seal, MD  vitamin E 180 MG (400 UNITS) capsule Take 400 Units by mouth daily.   Yes [provider]  warfarin (COUMADIN) 4 MG tablet Take 1 tablet (4 mg total) by mouth daily. 09/08/18  Yes Gerlene Fee, NP  Zinc 50 MG TABS Take 50 mg by mouth daily.    Yes [provider]  albuterol (VENTOLIN HFA) 108 (90 Base) MCG/ACT inhaler Inhale 1-2 puffs into the lungs every 6 (six) hours as needed for wheezing or shortness of breath.    [provider]  buPROPion (WELLBUTRIN XL) 150 MG 24 hr tablet Take 150 mg by mouth daily. Patient not taking: Reported on 05/12/2020 12/05/19   [provider]  buPROPion (WELLBUTRIN) 100 MG tablet Take by mouth. Patient not taking: No sig reported    [provider]  conjugated estrogens (PREMARIN) vaginal cream Apply 1 application topically daily as needed. Patient not taking: No sig reported    [provider]  diclofenac Sodium (VOLTAREN) 1 % GEL Apply 2 g topically 4 (four) times daily. 04/11/20   [provider]  ezetimibe-simvastatin (VYTORIN) 10-10 MG tablet Take 1 tablet by mouth at bedtime. Patient not taking: Reported on 12/09/2019    [provider]  lidocaine (LIDODERM) 5 % 2 patches daily. 01/24/20   [provider]  oxybutynin (DITROPAN) 5 MG tablet Take 1 tablet (5 mg total) by mouth every 8 (eight) hours as needed for bladder spasms. Patient not taking: No sig reported 02/29/20   Ceasar Mons, MD  phenazopyridine (PYRIDIUM) 200 MG tablet Take 1 tablet (200 mg total) by mouth 3 (three) times daily as needed (for pain with urination). Patient not taking: No sig reported 02/29/20 02/28/21  Ceasar Mons, MD  polyethylene glycol (MIRALAX / Floria Raveling) 46  g packet Take 17 g by mouth daily as needed. Patient not taking: No sig reported 12/18/18   Murlean Iba, MD    Allergies    Epinephrine, Demerol [meperidine], and Vicodin [hydrocodone-acetaminophen]  Review of Systems   Review of Systems  Constitutional: Negative for appetite change and fatigue.  HENT: Negative for congestion, ear discharge and sinus pressure.   Eyes: Negative for discharge.  Respiratory: Negative for cough.   Cardiovascular: Positive for chest pain.  Gastrointestinal: Negative for abdominal pain and diarrhea.  Genitourinary: Negative for frequency and hematuria.  Musculoskeletal: Negative for back pain.       Swelling in leg  Skin: Negative for rash.  Neurological: Negative for seizures and headaches.  Psychiatric/Behavioral: Negative for hallucinations.    Physical Exam Updated Vital Signs BP (!) 146/62    Pulse 70    Temp 98 F (36.7 C) (Oral)    Resp (!) 21    Ht 5' (1.524 m)    Wt 57.6 kg    SpO2 99%    BMI 24.80 kg/m   Physical Exam Vitals and nursing note reviewed.  Constitutional:      Appearance: She is well-developed.  HENT:     Head: Normocephalic.     Nose: Nose normal.  Eyes:     General: No scleral icterus.    Extraocular Movements: EOM normal.     Conjunctiva/sclera: Conjunctivae normal.  Neck:     Thyroid: No thyromegaly.  Cardiovascular:     Rate and Rhythm: Normal rate and regular rhythm.     Heart sounds: No murmur heard. No friction rub. No gallop.   Pulmonary:     Breath sounds: No stridor. No wheezing or rales.  Chest:     Chest wall: No tenderness.  Abdominal:     General: There is no distension.     Tenderness: There is no abdominal tenderness. There is no rebound.  Musculoskeletal:        General: No edema. Normal range of motion.     Cervical back: Neck supple.     Comments: 2+ edema in both legs. Mild tenderness anterior right tib-fib  Lymphadenopathy:     Cervical: No cervical adenopathy.  Skin:    Findings: No erythema or rash.  Neurological:     Mental Status: She is alert and oriented to person,  place, and time.     Motor: No abnormal muscle tone.     Coordination: Coordination normal.  Psychiatric:        Mood and Affect: Mood and affect normal.        Behavior: Behavior normal.     ED Results / Procedures / Treatments   Labs (all labs ordered are listed, but only abnormal results are displayed) Labs Reviewed  BASIC METABOLIC PANEL - Abnormal; Notable for the following components:      Result Value   Glucose, Bld 108 (*)    BUN 27 (*)    Creatinine, Ser 1.03 (*)    GFR, Estimated 54 (*)    All other components within normal limits  BRAIN NATRIURETIC PEPTIDE - Abnormal; Notable for the following components:   B Natriuretic Peptide 267.0 (*)    All other components within normal limits  TROPONIN I (HIGH SENSITIVITY) - Abnormal; Notable for the following components:   Troponin I (High Sensitivity) 24 (*)    All other components within normal limits  TROPONIN I (HIGH SENSITIVITY) - Abnormal; Notable for the following components:   Troponin  I (High Sensitivity) 23 (*)    All other components within normal limits  CBC  DIFFERENTIAL  HEPATIC FUNCTION PANEL    EKG None  Radiology DG Tibia/Fibula Right  Result Date: 05/12/2020 CLINICAL DATA:  Atraumatic lateral lower right leg pain. EXAM: RIGHT TIBIA AND FIBULA - 2 VIEW COMPARISON:  July 17, 2014 FINDINGS: There is no evidence of fracture or other focal bone lesions. Soft tissues are unremarkable. IMPRESSION: Negative. Electronically Signed   By: Virgina Norfolk M.D.   On: 05/12/2020 18:49   DG Chest Port 1 View  Result Date: 05/12/2020 CLINICAL DATA:  Short of breath, chest pain since this morning, lower extremity edema EXAM: PORTABLE CHEST 1 VIEW COMPARISON:  12/17/2018 FINDINGS: Single frontal view of the chest demonstrates borderline enlargement of the cardiac silhouette, likely accentuated by portable AP technique. No airspace disease, effusion, or pneumothorax. Stable chronic interstitial prominence  consistent with scarring. Chronic T12 compression deformity with previous vertebral augmentation. No acute fractures. IMPRESSION: 1. Grossly stable exam allowing for differences in positioning and technique. No acute process. Electronically Signed   By: Randa Ngo M.D.   On: 05/12/2020 16:49    Procedures Procedures (including critical care time)  Medications Ordered in ED Medications  furosemide (LASIX) injection 40 mg (40 mg Intravenous Given 05/12/20 2045)    ED Course  I have reviewed the triage vital signs and the nursing notes.  Pertinent labs & imaging results that were available during my care of the patient were reviewed by me and considered in my medical decision making (see chart for details).    MDM Rules/Calculators/A&P                          Patient with worsening congestive heart failure. Troponins have been stable at about 23. BNP slightly elevated. Patient not having any chest pain here in emergency department. I do not believe she is having any type angina symptoms I think this is just extra fluid overload. She will be instructed to increase her Lasix to twice a day. And follow-up with her doctor next week Final Clinical Impression(s) / ED Diagnoses Final diagnoses:  Systolic congestive heart failure, unspecified HF chronicity ([place])    Rx / DC Orders ED Discharge Orders    None       Milton Ferguson, MD 05/12/20 2111

## 2020-05-12 NOTE — ED Notes (Signed)
Pt. States that the chest pressure is back. Pt. Demonstrated it's in the center of chest. Pt stated " It hurts more when I press on it." Pt. Says it's more generalized pain and rates the pain 3/10.

## 2020-05-12 NOTE — Discharge Instructions (Addendum)
Take your fluid pill twice a day and follow-up with your family doctor next week

## 2020-05-15 DIAGNOSIS — R600 Localized edema: Secondary | ICD-10-CM | POA: Diagnosis not present

## 2020-05-15 DIAGNOSIS — R079 Chest pain, unspecified: Secondary | ICD-10-CM | POA: Diagnosis not present

## 2020-05-15 DIAGNOSIS — M25551 Pain in right hip: Secondary | ICD-10-CM | POA: Diagnosis not present

## 2020-05-15 DIAGNOSIS — G40009 Localization-related (focal) (partial) idiopathic epilepsy and epileptic syndromes with seizures of localized onset, not intractable, without status epilepticus: Secondary | ICD-10-CM | POA: Diagnosis not present

## 2020-05-15 DIAGNOSIS — M79602 Pain in left arm: Secondary | ICD-10-CM | POA: Diagnosis not present

## 2020-05-15 DIAGNOSIS — Z0001 Encounter for general adult medical examination with abnormal findings: Secondary | ICD-10-CM | POA: Diagnosis not present

## 2020-05-15 DIAGNOSIS — M79604 Pain in right leg: Secondary | ICD-10-CM | POA: Diagnosis not present

## 2020-05-15 DIAGNOSIS — R55 Syncope and collapse: Secondary | ICD-10-CM | POA: Diagnosis not present

## 2020-05-15 DIAGNOSIS — Z23 Encounter for immunization: Secondary | ICD-10-CM | POA: Diagnosis not present

## 2020-05-15 DIAGNOSIS — E039 Hypothyroidism, unspecified: Secondary | ICD-10-CM | POA: Diagnosis not present

## 2020-05-15 DIAGNOSIS — M542 Cervicalgia: Secondary | ICD-10-CM | POA: Diagnosis not present

## 2020-05-15 DIAGNOSIS — Z86718 Personal history of other venous thrombosis and embolism: Secondary | ICD-10-CM | POA: Diagnosis not present

## 2020-05-15 DIAGNOSIS — M79601 Pain in right arm: Secondary | ICD-10-CM | POA: Diagnosis not present

## 2020-05-15 DIAGNOSIS — Z7901 Long term (current) use of anticoagulants: Secondary | ICD-10-CM | POA: Diagnosis not present

## 2020-05-15 DIAGNOSIS — R319 Hematuria, unspecified: Secondary | ICD-10-CM | POA: Diagnosis not present

## 2020-05-23 DIAGNOSIS — M79604 Pain in right leg: Secondary | ICD-10-CM | POA: Diagnosis not present

## 2020-05-23 DIAGNOSIS — M25551 Pain in right hip: Secondary | ICD-10-CM | POA: Diagnosis not present

## 2020-05-23 DIAGNOSIS — R600 Localized edema: Secondary | ICD-10-CM | POA: Diagnosis not present

## 2020-06-01 DIAGNOSIS — I5032 Chronic diastolic (congestive) heart failure: Secondary | ICD-10-CM | POA: Diagnosis not present

## 2020-06-01 DIAGNOSIS — E261 Secondary hyperaldosteronism: Secondary | ICD-10-CM | POA: Diagnosis not present

## 2020-06-18 DIAGNOSIS — R319 Hematuria, unspecified: Secondary | ICD-10-CM | POA: Diagnosis not present

## 2020-06-18 DIAGNOSIS — Z0001 Encounter for general adult medical examination with abnormal findings: Secondary | ICD-10-CM | POA: Diagnosis not present

## 2020-06-18 DIAGNOSIS — E039 Hypothyroidism, unspecified: Secondary | ICD-10-CM | POA: Diagnosis not present

## 2020-06-18 DIAGNOSIS — Z86718 Personal history of other venous thrombosis and embolism: Secondary | ICD-10-CM | POA: Diagnosis not present

## 2020-06-18 DIAGNOSIS — M542 Cervicalgia: Secondary | ICD-10-CM | POA: Diagnosis not present

## 2020-06-18 DIAGNOSIS — N3281 Overactive bladder: Secondary | ICD-10-CM | POA: Diagnosis not present

## 2020-06-18 DIAGNOSIS — Z7901 Long term (current) use of anticoagulants: Secondary | ICD-10-CM | POA: Diagnosis not present

## 2020-06-18 DIAGNOSIS — M79602 Pain in left arm: Secondary | ICD-10-CM | POA: Diagnosis not present

## 2020-06-18 DIAGNOSIS — R079 Chest pain, unspecified: Secondary | ICD-10-CM | POA: Diagnosis not present

## 2020-06-18 DIAGNOSIS — Z23 Encounter for immunization: Secondary | ICD-10-CM | POA: Diagnosis not present

## 2020-06-18 DIAGNOSIS — M79604 Pain in right leg: Secondary | ICD-10-CM | POA: Diagnosis not present

## 2020-06-18 DIAGNOSIS — M25551 Pain in right hip: Secondary | ICD-10-CM | POA: Diagnosis not present

## 2020-06-18 DIAGNOSIS — I69315 Cognitive social or emotional deficit following cerebral infarction: Secondary | ICD-10-CM | POA: Diagnosis not present

## 2020-06-18 DIAGNOSIS — M79601 Pain in right arm: Secondary | ICD-10-CM | POA: Diagnosis not present

## 2020-06-18 DIAGNOSIS — G40009 Localization-related (focal) (partial) idiopathic epilepsy and epileptic syndromes with seizures of localized onset, not intractable, without status epilepticus: Secondary | ICD-10-CM | POA: Diagnosis not present

## 2020-06-18 DIAGNOSIS — R55 Syncope and collapse: Secondary | ICD-10-CM | POA: Diagnosis not present

## 2020-06-18 DIAGNOSIS — R600 Localized edema: Secondary | ICD-10-CM | POA: Diagnosis not present

## 2020-06-28 DIAGNOSIS — J45909 Unspecified asthma, uncomplicated: Secondary | ICD-10-CM | POA: Diagnosis not present

## 2020-06-28 DIAGNOSIS — J189 Pneumonia, unspecified organism: Secondary | ICD-10-CM | POA: Diagnosis not present

## 2020-07-02 ENCOUNTER — Encounter (HOSPITAL_COMMUNITY): Admission: RE | Admit: 2020-07-02 | Payer: PPO | Source: Ambulatory Visit

## 2020-07-02 DIAGNOSIS — J189 Pneumonia, unspecified organism: Secondary | ICD-10-CM | POA: Diagnosis not present

## 2020-07-02 DIAGNOSIS — I69315 Cognitive social or emotional deficit following cerebral infarction: Secondary | ICD-10-CM | POA: Diagnosis not present

## 2020-07-03 DIAGNOSIS — R059 Cough, unspecified: Secondary | ICD-10-CM | POA: Diagnosis not present

## 2020-07-03 DIAGNOSIS — J189 Pneumonia, unspecified organism: Secondary | ICD-10-CM | POA: Diagnosis not present

## 2020-07-05 ENCOUNTER — Encounter (HOSPITAL_COMMUNITY): Payer: Self-pay

## 2020-07-05 ENCOUNTER — Other Ambulatory Visit: Payer: Self-pay

## 2020-07-05 ENCOUNTER — Encounter (HOSPITAL_COMMUNITY)
Admission: RE | Admit: 2020-07-05 | Discharge: 2020-07-05 | Disposition: A | Discharge status: Home / Self Care | Payer: PPO | Source: Ambulatory Visit | Attending: Internal Medicine | Admitting: Internal Medicine

## 2020-07-05 DIAGNOSIS — M810 Age-related osteoporosis without current pathological fracture: Secondary | ICD-10-CM | POA: Diagnosis not present

## 2020-07-05 MED ORDER — DENOSUMAB 60 MG/ML ~~LOC~~ SOSY
60.0000 mg | PREFILLED_SYRINGE | Freq: Once | SUBCUTANEOUS | Status: AC
  Administered 2020-07-05: 60 mg via SUBCUTANEOUS

## 2020-07-09 DIAGNOSIS — J189 Pneumonia, unspecified organism: Secondary | ICD-10-CM | POA: Diagnosis not present

## 2020-07-09 DIAGNOSIS — R059 Cough, unspecified: Secondary | ICD-10-CM | POA: Diagnosis not present

## 2020-07-09 DIAGNOSIS — M79674 Pain in right toe(s): Secondary | ICD-10-CM | POA: Diagnosis not present

## 2020-07-09 DIAGNOSIS — Z7901 Long term (current) use of anticoagulants: Secondary | ICD-10-CM | POA: Diagnosis not present

## 2020-07-30 DIAGNOSIS — R600 Localized edema: Secondary | ICD-10-CM | POA: Diagnosis not present

## 2020-07-30 DIAGNOSIS — R519 Headache, unspecified: Secondary | ICD-10-CM | POA: Diagnosis not present

## 2020-07-30 DIAGNOSIS — R079 Chest pain, unspecified: Secondary | ICD-10-CM | POA: Diagnosis not present

## 2020-07-30 DIAGNOSIS — Z7901 Long term (current) use of anticoagulants: Secondary | ICD-10-CM | POA: Diagnosis not present

## 2020-07-30 DIAGNOSIS — R109 Unspecified abdominal pain: Secondary | ICD-10-CM | POA: Diagnosis not present

## 2020-08-21 DIAGNOSIS — R062 Wheezing: Secondary | ICD-10-CM | POA: Diagnosis not present

## 2020-08-21 DIAGNOSIS — R109 Unspecified abdominal pain: Secondary | ICD-10-CM | POA: Diagnosis not present

## 2020-08-21 DIAGNOSIS — R600 Localized edema: Secondary | ICD-10-CM | POA: Diagnosis not present

## 2020-08-21 DIAGNOSIS — R079 Chest pain, unspecified: Secondary | ICD-10-CM | POA: Diagnosis not present

## 2020-08-21 DIAGNOSIS — Z7901 Long term (current) use of anticoagulants: Secondary | ICD-10-CM | POA: Diagnosis not present

## 2020-08-21 DIAGNOSIS — M25551 Pain in right hip: Secondary | ICD-10-CM | POA: Diagnosis not present

## 2020-09-19 DIAGNOSIS — Z7901 Long term (current) use of anticoagulants: Secondary | ICD-10-CM | POA: Diagnosis not present

## 2020-09-19 DIAGNOSIS — R600 Localized edema: Secondary | ICD-10-CM | POA: Diagnosis not present

## 2020-09-19 DIAGNOSIS — J4540 Moderate persistent asthma, uncomplicated: Secondary | ICD-10-CM | POA: Diagnosis not present

## 2020-09-19 DIAGNOSIS — R062 Wheezing: Secondary | ICD-10-CM | POA: Diagnosis not present

## 2020-09-19 DIAGNOSIS — M25551 Pain in right hip: Secondary | ICD-10-CM | POA: Diagnosis not present

## 2020-09-19 DIAGNOSIS — R109 Unspecified abdominal pain: Secondary | ICD-10-CM | POA: Diagnosis not present

## 2020-09-19 DIAGNOSIS — I340 Nonrheumatic mitral (valve) insufficiency: Secondary | ICD-10-CM | POA: Diagnosis not present

## 2020-09-21 DIAGNOSIS — J45909 Unspecified asthma, uncomplicated: Secondary | ICD-10-CM | POA: Diagnosis not present

## 2020-09-28 ENCOUNTER — Encounter (HOSPITAL_COMMUNITY): Payer: Self-pay | Admitting: *Deleted

## 2020-09-28 ENCOUNTER — Other Ambulatory Visit: Payer: Self-pay

## 2020-09-28 ENCOUNTER — Emergency Department (HOSPITAL_COMMUNITY)
Admission: EM | Admit: 2020-09-28 | Discharge: 2020-09-28 | Disposition: A | Discharge status: Home / Self Care | Payer: PPO | Attending: Emergency Medicine | Admitting: Emergency Medicine

## 2020-09-28 DIAGNOSIS — R5381 Other malaise: Secondary | ICD-10-CM | POA: Insufficient documentation

## 2020-09-28 DIAGNOSIS — K921 Melena: Secondary | ICD-10-CM | POA: Insufficient documentation

## 2020-09-28 DIAGNOSIS — J45909 Unspecified asthma, uncomplicated: Secondary | ICD-10-CM | POA: Insufficient documentation

## 2020-09-28 DIAGNOSIS — E039 Hypothyroidism, unspecified: Secondary | ICD-10-CM | POA: Insufficient documentation

## 2020-09-28 DIAGNOSIS — R0789 Other chest pain: Secondary | ICD-10-CM | POA: Insufficient documentation

## 2020-09-28 DIAGNOSIS — Z79899 Other long term (current) drug therapy: Secondary | ICD-10-CM | POA: Insufficient documentation

## 2020-09-28 DIAGNOSIS — Z7901 Long term (current) use of anticoagulants: Secondary | ICD-10-CM | POA: Diagnosis not present

## 2020-09-28 DIAGNOSIS — I110 Hypertensive heart disease with heart failure: Secondary | ICD-10-CM | POA: Insufficient documentation

## 2020-09-28 DIAGNOSIS — R531 Weakness: Secondary | ICD-10-CM | POA: Diagnosis not present

## 2020-09-28 DIAGNOSIS — I2510 Atherosclerotic heart disease of native coronary artery without angina pectoris: Secondary | ICD-10-CM | POA: Insufficient documentation

## 2020-09-28 DIAGNOSIS — I5032 Chronic diastolic (congestive) heart failure: Secondary | ICD-10-CM | POA: Diagnosis not present

## 2020-09-28 LAB — BLOOD GAS, VENOUS
Acid-Base Excess: 6.6 mmol/L — ABNORMAL HIGH (ref 0.0–2.0)
Bicarbonate: 29.2 mmol/L — ABNORMAL HIGH (ref 20.0–28.0)
FIO2: 21
O2 Saturation: 80.4 %
Patient temperature: 37
pCO2, Ven: 49.6 mmHg (ref 44.0–60.0)
pH, Ven: 7.415 (ref 7.250–7.430)
pO2, Ven: 46.5 mmHg — ABNORMAL HIGH (ref 32.0–45.0)

## 2020-09-28 LAB — COMPREHENSIVE METABOLIC PANEL
ALT: 24 U/L (ref 0–44)
AST: 29 U/L (ref 15–41)
Albumin: 4.2 g/dL (ref 3.5–5.0)
Alkaline Phosphatase: 57 U/L (ref 38–126)
Anion gap: 9 (ref 5–15)
BUN: 23 mg/dL (ref 8–23)
CO2: 30 mmol/L (ref 22–32)
Calcium: 10.2 mg/dL (ref 8.9–10.3)
Chloride: 101 mmol/L (ref 98–111)
Creatinine, Ser: 1.09 mg/dL — ABNORMAL HIGH (ref 0.44–1.00)
GFR, Estimated: 50 mL/min — ABNORMAL LOW (ref >60)
Glucose, Bld: 105 mg/dL — ABNORMAL HIGH (ref 70–99)
Potassium: 3.1 mmol/L — ABNORMAL LOW (ref 3.5–5.1)
Sodium: 140 mmol/L (ref 135–145)
Total Bilirubin: 0.9 mg/dL (ref 0.3–1.2)
Total Protein: 7 g/dL (ref 6.5–8.1)

## 2020-09-28 LAB — CBC WITH DIFFERENTIAL/PLATELET
Abs Immature Granulocytes: 0.01 10*3/uL (ref 0.00–0.07)
Basophils Absolute: 0.1 10*3/uL (ref 0.0–0.1)
Basophils Relative: 1 %
Eosinophils Absolute: 0.1 10*3/uL (ref 0.0–0.5)
Eosinophils Relative: 1 %
HCT: 44.7 % (ref 36.0–46.0)
Hemoglobin: 14.5 g/dL (ref 12.0–15.0)
Immature Granulocytes: 0 %
Lymphocytes Relative: 29 %
Lymphs Abs: 2 10*3/uL (ref 0.7–4.0)
MCH: 29.1 pg (ref 26.0–34.0)
MCHC: 32.4 g/dL (ref 30.0–36.0)
MCV: 89.8 fL (ref 80.0–100.0)
Monocytes Absolute: 0.4 10*3/uL (ref 0.1–1.0)
Monocytes Relative: 6 %
Neutro Abs: 4.3 10*3/uL (ref 1.7–7.7)
Neutrophils Relative %: 63 %
Platelets: 277 10*3/uL (ref 150–400)
RBC: 4.98 MIL/uL (ref 3.87–5.11)
RDW: 14.8 % (ref 11.5–15.5)
WBC: 7 10*3/uL (ref 4.0–10.5)
nRBC: 0 % (ref 0.0–0.2)

## 2020-09-28 LAB — D-DIMER, QUANTITATIVE: D-Dimer, Quant: <0.27 ug/mL-FEU (ref 0.00–0.50)

## 2020-09-28 NOTE — ED Provider Notes (Signed)
[facility] EMERGENCY DEPARTMENT Provider Note   CSN: [id number] Arrival date & time: 09/28/20  1221     History Chief Complaint  Patient presents with  . Chest Pain    [name] is a 85 y.o. female.  HPI Patient presents for several problems including right-sided chest discomfort, general weakness, frequent stools, dark-colored stool, and weakness which made her feel near syncopal today.  All symptoms onset within the last 12 hours.  She drove her own vehicle here for evaluation.  She feels somewhat better at this time.  She took her usual morning medicines and ate breakfast today but did not eat lunch.  She denies cough, shortness of breath, abdominal or back pain.  She is ambulating easily.  There are no other known modifying factors.    Past Medical History:  Diagnosis Date  . Adenomatous colon polyp 2008  . Anxiety   . Asthma    has not needed in over a year  . Complication of anesthesia    pt reports TIA and sezures after surgery  . Coronary artery disease   . CVA (cerebral vascular accident) ([place])    x 3  . Degenerative disc disease, lumbar   . Depression   . Diverticulosis   . GERD (gastroesophageal reflux disease)   . 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 Hemorrhoids 2007  . History of kidney stones   . HTN (hypertension)   . Hyperplastic colon polyp 2005  . Hypothyroidism   . Narcolepsy   . PONV (postoperative nausea and vomiting)   . TIA (transient ischemic attack)     Patient Active Problem List   Diagnosis Date Noted  . Urge incontinence 12/09/2019  . Mixed incontinence urge and stress (female)(female) 08/05/2019  . Renal stones 08/05/2019  . UPJ obstruction, 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  . Hydronephrosis with ureteropelvic junction (UPJ) obstruction 08/05/2019  . Postmenopausal atrophic vaginitis 08/05/2019  . Weak urinary stream 08/05/2019  . Paresthesia 12/17/2018  . Chronic midline thoracic back pain 12/17/2018  . H/O:  CVA (cerebrovascular accident) 08/25/2018  . Depression, recurrent ([place]) 08/25/2018  . T12 compression fracture, with delayed healing, subsequent encounter   . Low back pain 08/19/2018  . Constipation 08/19/2018  . Chronic diastolic CHF (congestive heart failure) ([place]) 08/19/2018  . Acquired hypothyroidism 08/19/2018  . Intractable pain 08/19/2018  . Essential hypertension 08/19/2018  . T12 compression fracture ([place]) 08/18/2018  . Breast cancer ([place]) 06/23/2016  . Change in bowel habits 01/11/2015  . Loss of weight 01/11/2015  . Abdominal pain, chronic, right upper quadrant 01/11/2015  . Unspecified chronic bronchitis ([place]) 09/25/2013  . Other dysphagia 04/01/2013  . Cough 01/20/2013  . Thrush 10/28/2012  . Chest pain 09/26/2012  . H/O adenomatous polyp of colon 11/18/2011  . Chronic anticoagulation 11/18/2011    Past Surgical History:  Procedure Laterality Date  . ABDOMINAL HYSTERECTOMY     age 57, complicated by poor wound healing, followed by revision of scar and radiation for ?malignancy  . BACK SURGERY  07/2018   lost 2 inches  . BREAST ENHANCEMENT SURGERY    . BREAST IMPLANT EXCHANGE Right 06/23/2016   Procedure: RIGHT BREAST IMPLANT REMOVAL AND REPLACEMENT;  Surgeon: Cristine Polio, MD;  Location: [facility];  Service: Plastics;  Laterality: Right;  . COLONOSCOPY  2005   2 hyperplastic polyps  . COLONOSCOPY  2007   Dr. Oneida Alar- hemorrhoids  . COLONOSCOPY  2008   Dr.  Fields-adenomatous polyp- rare sigmoid diverticulosis, internal hemorrhoids  . COLONOSCOPY  12/01/2011   Procedure: COLONOSCOPY;  Surgeon: Danie Binder, MD;  [facility];  Service: Endoscopy;  Laterality: N/A;  12:30 PM  . CYSTOSCOPY/URETEROSCOPY/HOLMIUM LASER/STENT PLACEMENT Left 02/29/2020   Procedure: CYSTOSCOPY/URETEROSCOPY/HOLMIUM LASER/STENT PLACEMENT;  Surgeon: Ceasar Mons, MD;  Location: [facility];  Service: Urology;  Laterality: Left;  . epidural steroid  injection     She is getting injections with Dr. Nelva Bush q3wks.  . ESOPHAGOGASTRODUODENOSCOPY (EGD) WITH ESOPHAGEAL DILATION N/A 04/15/2013   Procedure: ESOPHAGOGASTRODUODENOSCOPY (EGD) WITH ESOPHAGEAL DILATION;  Surgeon: Danie Binder, MD;  Location: [facility];  Service: Endoscopy;  Laterality: N/A;  11:45-moved to 12:30 Darius Bump to notify pt  . EYE SURGERY  2010  . EYE SURGERY    . IR KYPHO LUMBAR INC FX REDUCE BONE BX UNI/BIL CANNULATION INC/IMAGING  08/23/2018  . IR RADIOLOGIST EVAL & MGMT  12/23/2018  . right thumb surgery       OB History    Gravida      Para      Term      Preterm      AB      Living  1     SAB      IAB      Ectopic      Multiple      Live Births              Family History  Problem Relation Age of Onset  . Colon cancer Father        age 88  . Prostate cancer Father   . Pancreatic cancer Mother        age 27    Social History   Tobacco Use  . Smoking status: Never Smoker  . Smokeless tobacco: Never Used  Vaping Use  . Vaping Use: Never used  Substance Use Topics  . Alcohol use: No  . Drug use: No    Home Medications Prior to Admission medications   Medication Sig Start Date End Date Taking? Authorizing Provider  albuterol (PROVENTIL) (2.5 MG/3ML) 0.083% nebulizer solution Take 3 mLs (2.5 mg total) by nebulization every 6 (six) hours as needed for wheezing or shortness of breath. 09/08/18   Gerlene Fee, NP  albuterol (VENTOLIN HFA) 108 (90 Base) MCG/ACT inhaler Inhale 1-2 puffs into the lungs every 6 (six) hours as needed for wheezing or shortness of breath.    [provider]  amLODipine (NORVASC) 5 MG tablet Take 1 tablet (5 mg total) by mouth daily. 09/08/18   Gerlene Fee, NP  Ascorbic Acid (VITAMIN C) 1000 MG tablet Take 1,000 mg by mouth daily.    [provider]  B Complex Vitamins (VITAMIN B COMPLEX) TABS Take 1 tablet by mouth daily.    [provider]  Biotin 300 MCG TABS Take 300  mcg by mouth daily after supper.     [provider]  buPROPion (WELLBUTRIN XL) 150 MG 24 hr tablet Take 150 mg by mouth daily. Patient not taking: Reported on 05/12/2020 12/05/19   [provider]  buPROPion (WELLBUTRIN) 100 MG tablet Take by mouth. Patient not taking: No sig reported    [provider]  Calcium Carb-Cholecalciferol (CALCIUM 600 + D PO) Take 1 tablet by mouth in the morning and at bedtime.    [provider]  conjugated estrogens (PREMARIN) vaginal cream Apply 1 application topically daily as needed. Patient not taking:  No sig reported    [provider]  cyanocobalamin 100 MCG tablet Take 1 tablet by mouth daily.  08/24/18   [provider]  denosumab (PROLIA) 60 MG/ML SOSY injection Inject 60 mg into the skin every 6 (six) months.    [provider]  diclofenac Sodium (VOLTAREN) 1 % GEL Apply 2 g topically 4 (four) times daily. 04/11/20   [provider]  ezetimibe (ZETIA) 10 MG tablet Take 1 tablet (10 mg total) by mouth daily. 09/08/18   Gerlene Fee, NP  ezetimibe-simvastatin (VYTORIN) 10-10 MG tablet Take 1 tablet by mouth at bedtime. Patient not taking: Reported on 12/09/2019    [provider]  Ferrous Gluconate (IRON) 240 (27 Fe) MG TABS Take 1 tablet by mouth daily. 09/08/18   Gerlene Fee, NP  Flaxseed, Linseed, (FLAXSEED OIL) 1000 MG CAPS Take 1,000 mg by mouth daily.     [provider]  furosemide (LASIX) 40 MG tablet TAKE 1 TABLET BY MOUTH TWICE DAILY Patient taking differently: Take 40 mg by mouth daily. 01/24/19   Herminio Commons, MD  Lecithin 1200 MG CAPS Take 1,200 mg by mouth daily.     [provider]  levothyroxine (SYNTHROID, LEVOTHROID) 50 MCG tablet Take 1 tablet (50 mcg total) by mouth daily. 09/08/18   Gerlene Fee, NP  lidocaine (LIDODERM) 5 % 2 patches daily. 01/24/20   [provider]  Lutein 40 MG CAPS Take 40 mg by mouth daily.   08/24/18   [provider]  Magnesium 250 MG TABS Take 250 mg by mouth daily.     [provider]  metoprolol tartrate (LOPRESSOR) 25 MG tablet Take 0.5 tablets (12.5 mg total) by mouth 2 (two) times daily. Take with food / meals Patient taking differently: Take 25 mg by mouth 2 (two) times daily. 12/18/18   Johnson, Clanford L, MD  Omega-3 Fatty Acids (FISH OIL) 1200 MG CAPS Take 1,200 mg by mouth daily.    [provider]  oxybutynin (DITROPAN) 5 MG tablet Take 1 tablet (5 mg total) by mouth every 8 (eight) hours as needed for bladder spasms. Patient not taking: No sig reported 02/29/20   Ceasar Mons, MD  pantoprazole (PROTONIX) 40 MG tablet Take 1 tablet (40 mg total) by mouth daily. 12/18/18   Johnson, Clanford L, MD  phenazopyridine (PYRIDIUM) 200 MG tablet Take 1 tablet (200 mg total) by mouth 3 (three) times daily as needed (for pain with urination). Patient not taking: No sig reported 02/29/20 02/28/21  Ceasar Mons, MD  polyethylene glycol (MIRALAX / GLYCOLAX) 17 g packet Take 17 g by mouth daily as needed. Patient not taking: No sig reported 12/18/18   Irwin Brakeman L, MD  potassium chloride (K-DUR) 10 MEQ tablet Take 1 tablet (10 mEq total) by mouth 2 (two) times daily. 09/08/18   Gerlene Fee, NP  Turmeric 500 MG TABS Take 500 mg by mouth daily.     [provider]  Vibegron (GEMTESA) 75 MG TABS Take 75 mg by mouth daily. 11/11/19   Irine Seal, MD  vitamin E 180 MG (400 UNITS) capsule Take 400 Units by mouth daily.    [provider]  warfarin (COUMADIN) 4 MG tablet Take 1 tablet (4 mg total) by mouth daily. 09/08/18   Gerlene Fee, NP  Zinc 50 MG TABS Take 50 mg by mouth daily.     [provider]    Allergies  Epinephrine, Demerol [meperidine], and Vicodin [hydrocodone-acetaminophen]  Review of Systems   Review of Systems  All other systems reviewed and are negative.   Physical  Exam Updated Vital Signs BP 125/85 (BP Location: Left Arm)   Pulse 87   Temp 98 F (36.7 C) (Oral)   Resp 18   SpO2 99%   Physical Exam Vitals and nursing note reviewed.  Constitutional:      Appearance: She is well-developed.  HENT:     Head: Normocephalic and atraumatic.     Right Ear: External ear normal.     Left Ear: External ear normal.  Eyes:     Conjunctiva/sclera: Conjunctivae normal.     Pupils: Pupils are equal, round, and reactive to light.  Neck:     Trachea: Phonation normal.  Cardiovascular:     Rate and Rhythm: Normal rate and regular rhythm.     Heart sounds: Normal heart sounds.  Pulmonary:     Effort: Pulmonary effort is normal. No respiratory distress.     Breath sounds: No stridor. No rhonchi.     Comments: Decreased breath sounds bilaterally with few scattered wheezes.  There is no increased work of breathing Abdominal:     General: There is no distension.     Palpations: Abdomen is soft.     Tenderness: There is no abdominal tenderness. There is no guarding.  Musculoskeletal:        General: No swelling or tenderness. Normal range of motion.     Cervical back: Normal range of motion and neck supple.     Right lower leg: No edema.     Left lower leg: No edema.  Skin:    General: Skin is warm and dry.  Neurological:     Mental Status: She is alert and oriented to person, place, and time.     Cranial Nerves: No cranial nerve deficit.     Sensory: No sensory deficit.     Motor: No abnormal muscle tone.     Coordination: Coordination normal.  Psychiatric:        Mood and Affect: Mood normal.        Behavior: Behavior normal.        Thought Content: Thought content normal.        Judgment: Judgment normal.     ED Results / Procedures / Treatments   Labs (all labs ordered are listed, but only abnormal results are displayed) Labs Reviewed  COMPREHENSIVE METABOLIC PANEL - Abnormal; Notable for the following components:      Result Value    Potassium 3.1 (*)    Glucose, Bld 105 (*)    Creatinine, Ser 1.09 (*)    GFR, Estimated 50 (*)    All other components within normal limits  BLOOD GAS, VENOUS - Abnormal; Notable for the following components:   pO2, Ven 46.5 (*)    Bicarbonate 29.2 (*)    Acid-Base Excess 6.6 (*)    All other components within normal limits  CBC WITH DIFFERENTIAL/PLATELET  D-DIMER, QUANTITATIVE    EKG None    Date: 09/28/20  Rate: 79  Rhythm: normal sinus rhythm  QRS Axis: normal  PR and QT Intervals: normal  ST/T Wave abnormalities: normal  PR and QRS Conduction Disutrbances:none       Radiology No results found.  Procedures Procedures   Medications Ordered in ED Medications - No data to display  ED Course  I have reviewed the triage vital signs and the nursing notes.  Pertinent labs & imaging results that were available during my care of the patient were reviewed by me and considered in my medical decision making (see chart for details).    MDM Rules/Calculators/A&P                           Patient Vitals for the past 24 hrs:  BP Temp Temp src Pulse Resp SpO2  09/28/20 1228 125/85 98 F (36.7 C) Oral 87 18 99 %    3:01 PM Reevaluation with update and discussion. After initial assessment and treatment, an updated evaluation reveals at this time she is comfortable, resting easily.  Findings discussed with the patient and all questions were answered. Daleen Bo   Medical Decision Making:  This patient is presenting for evaluation of nonspecific and varied symptoms, which does require a range of treatment options, and is a complaint that involves a moderate risk of morbidity and mortality. The differential diagnoses include acute illness, metabolic disorder, blood loss, dehydration. I decided to review old records, and in summary elderly female presenting for nonspecific symptoms, she drove herself here..  I did not require additional historical information from  anyone.  Clinical Laboratory Tests Ordered, included CBC, Metabolic panel and Venous blood gas, D-dimer. Review indicates normal except potassium low, glucose slightly high, creatinine slightly high, GFR low,.   Critical Interventions-clinical evaluation, laboratory testing, observation and reassessment  After These Interventions, the Patient was reevaluated and was found stable for discharge.  Patient presenting with nonspecific complaints, screening evaluation is normal.  No indication for further evaluation treatment.  Doubt serious bacterial infection, metabolic disorder, impending vascular collapse.  Patient to follow-up with PCP next week for further evaluation as needed.  CRITICAL CARE-no Performed by: Daleen Bo  Nursing Notes Reviewed/ Care Coordinated Applicable Imaging Reviewed Interpretation of Laboratory Data incorporated into ED treatment  The patient appears reasonably screened and/or stabilized for discharge and I doubt any other medical condition or other [facility] requiring further screening, evaluation, or treatment in the ED at this time prior to discharge.  Plan: Home Medications-continue usual; Home Treatments-rest, fluids; return here if the recommended treatment, does not improve the symptoms; Recommended follow up-PCP, 1 week for checkup.     Final Clinical Impression(s) / ED Diagnoses Final diagnoses:  Malaise    Rx / DC Orders ED Discharge Orders    None       Daleen Bo, MD 09/28/20 1501

## 2020-09-28 NOTE — Discharge Instructions (Signed)
The testing today does not show any serious problems.  All of your tests are normal.  Make sure you are getting plenty of rest, drink a lot of fluids and follow-up with your PCP next week for a checkup.

## 2020-09-28 NOTE — ED Triage Notes (Signed)
Pt c/o chest pain and feeling weak that woke her up at 5am; pt also c/o dark stools with some diarrhea

## 2020-10-02 DIAGNOSIS — J45909 Unspecified asthma, uncomplicated: Secondary | ICD-10-CM | POA: Diagnosis not present

## 2020-10-02 DIAGNOSIS — Z8709 Personal history of other diseases of the respiratory system: Secondary | ICD-10-CM | POA: Diagnosis not present

## 2020-10-02 DIAGNOSIS — R5381 Other malaise: Secondary | ICD-10-CM | POA: Diagnosis not present

## 2020-10-11 ENCOUNTER — Other Ambulatory Visit: Payer: Self-pay

## 2020-10-11 ENCOUNTER — Ambulatory Visit (INDEPENDENT_AMBULATORY_CARE_PROVIDER_SITE_OTHER): Payer: PPO | Admitting: Cardiology

## 2020-10-11 ENCOUNTER — Encounter: Payer: Self-pay | Admitting: Cardiology

## 2020-10-11 DIAGNOSIS — R079 Chest pain, unspecified: Secondary | ICD-10-CM | POA: Diagnosis not present

## 2020-10-11 NOTE — Progress Notes (Signed)
Clinical Summary [name] is a 85 y.o.female seen as a new patient, last seen by Dr Bronson Ing in 06/2017. Seen today for the following medical problems  1. . History of chest pain - Jan 2019 nuclear stress no ischemia - 2019 Echocardiogram demonstrated normal left ventricular systolic function, LVEF 02-58%, mild LVH, indeterminate diastolic function, and mild tricuspid regurgitation. - from notes thought symptoms were secodnary to arthritis   - recent chest pains - ER visit 09/28/20 with chest pain - Ddimer neg - EKG SR, without ischemic changes  - symptoms off and on, not able to tell how long - dull pain, can be pressure. Midchest, 5-8/10 in severity. Can occur at rest or with exertion. Can have some SOB. Unsure of duration. Varies in frequency - cannot recall if similar to prior chest pain.      2. CVA/TIAs - reports this is why she is coumadin.  Past Medical History:  Diagnosis Date  . Adenomatous colon polyp 2008  . Anxiety   . Asthma    has not needed in over a year  . Complication of anesthesia    pt reports TIA and sezures after surgery  . Coronary artery disease   . CVA (cerebral vascular accident) ([place])    x 3  . Degenerative disc disease, lumbar   . Depression   . Diverticulosis   . GERD (gastroesophageal reflux disease)   . 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65   age 76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 2 weeks  . Hemorrhoids 2007  . History of kidney stones   . HTN (hypertension)   . Hyperplastic colon polyp 2005  . Hypothyroidism   . Narcolepsy   . PONV (postoperative nausea and vomiting)   . TIA (transient ischemic attack)      Allergies  Allergen Reactions  . Epinephrine Anaphylaxis  . Demerol [Meperidine] Other (See Comments)    headaches  . Vicodin [Hydrocodone-Acetaminophen] Other (See Comments)    [name] it makes her crazy. Pt tolerates acetaminophen     Current Outpatient Medications  Medication Sig Dispense Refill  . albuterol (PROVENTIL) (2.5 MG/3ML)  0.083% nebulizer solution Take 3 mLs (2.5 mg total) by nebulization every 6 (six) hours as needed for wheezing or shortness of breath. 75 mL 0  . albuterol (VENTOLIN HFA) 108 (90 Base) MCG/ACT inhaler Inhale 1-2 puffs into the lungs every 6 (six) hours as needed for wheezing or shortness of breath.    Marland Kitchen amLODipine (NORVASC) 5 MG tablet Take 1 tablet (5 mg total) by mouth daily. 30 tablet 0  . Ascorbic Acid (VITAMIN C) 1000 MG tablet Take 1,000 mg by mouth daily.    . B Complex Vitamins (VITAMIN B COMPLEX) TABS Take 1 tablet by mouth daily.    . Biotin 300 MCG TABS Take 300 mcg by mouth daily after supper.     Marland Kitchen buPROPion (WELLBUTRIN XL) 150 MG 24 hr tablet Take 150 mg by mouth daily. (Patient not taking: Reported on 05/12/2020)    . buPROPion (WELLBUTRIN) 100 MG tablet Take by mouth. (Patient not taking: No sig reported)    . Calcium Carb-Cholecalciferol (CALCIUM 600 + D PO) Take 1 tablet by mouth in the morning and at bedtime.    . conjugated estrogens (PREMARIN) vaginal cream Apply 1 application topically daily as needed. (Patient not taking: No sig reported)    . cyanocobalamin 100 MCG tablet Take 1 tablet by mouth daily.     Marland Kitchen denosumab (PROLIA) 60 MG/ML SOSY injection  Inject 60 mg into the skin every 6 (six) months.    . diclofenac Sodium (VOLTAREN) 1 % GEL Apply 2 g topically 4 (four) times daily.    Marland Kitchen ezetimibe (ZETIA) 10 MG tablet Take 1 tablet (10 mg total) by mouth daily. 30 tablet 0  . ezetimibe-simvastatin (VYTORIN) 10-10 MG tablet Take 1 tablet by mouth at bedtime. (Patient not taking: Reported on 12/09/2019)    . Ferrous Gluconate (IRON) 240 (27 Fe) MG TABS Take 1 tablet by mouth daily. 30 tablet 0  . Flaxseed, Linseed, (FLAXSEED OIL) 1000 MG CAPS Take 1,000 mg by mouth daily.     . furosemide (LASIX) 40 MG tablet TAKE 1 TABLET BY MOUTH TWICE DAILY (Patient taking differently: Take 40 mg by mouth daily.) 30 tablet 0  . Lecithin 1200 MG CAPS Take 1,200 mg by mouth daily.     Marland Kitchen  levothyroxine (SYNTHROID, LEVOTHROID) 50 MCG tablet Take 1 tablet (50 mcg total) by mouth daily. 30 tablet 0  . lidocaine (LIDODERM) 5 % 2 patches daily.    . Lutein 40 MG CAPS Take 40 mg by mouth daily.     . Magnesium 250 MG TABS Take 250 mg by mouth daily.     . metoprolol tartrate (LOPRESSOR) 25 MG tablet Take 0.5 tablets (12.5 mg total) by mouth 2 (two) times daily. Take with food / meals (Patient taking differently: Take 25 mg by mouth 2 (two) times daily.) 30 tablet 0  . Omega-3 Fatty Acids (FISH OIL) 1200 MG CAPS Take 1,200 mg by mouth daily.    Marland Kitchen oxybutynin (DITROPAN) 5 MG tablet Take 1 tablet (5 mg total) by mouth every 8 (eight) hours as needed for bladder spasms. (Patient not taking: No sig reported) 30 tablet 1  . pantoprazole (PROTONIX) 40 MG tablet Take 1 tablet (40 mg total) by mouth daily.    . phenazopyridine (PYRIDIUM) 200 MG tablet Take 1 tablet (200 mg total) by mouth 3 (three) times daily as needed (for pain with urination). (Patient not taking: No sig reported) 30 tablet 0  . polyethylene glycol (MIRALAX / GLYCOLAX) 17 g packet Take 17 g by mouth daily as needed. (Patient not taking: No sig reported)    . potassium chloride (K-DUR) 10 MEQ tablet Take 1 tablet (10 mEq total) by mouth 2 (two) times daily. 60 tablet 0  . Turmeric 500 MG TABS Take 500 mg by mouth daily.     . Vibegron (GEMTESA) 75 MG TABS Take 75 mg by mouth daily. 28 tablet 0  . vitamin E 180 MG (400 UNITS) capsule Take 400 Units by mouth daily.    Marland Kitchen warfarin (COUMADIN) 4 MG tablet Take 1 tablet (4 mg total) by mouth daily. 30 tablet 0  . Zinc 50 MG TABS Take 50 mg by mouth daily.      No current facility-administered medications for this visit.     Past Surgical History:  Procedure Laterality Date  . ABDOMINAL HYSTERECTOMY     age 31, complicated by poor wound healing, followed by revision of scar and radiation for ?malignancy  . BACK SURGERY  07/2018   lost 2 inches  . BREAST ENHANCEMENT SURGERY    .  BREAST IMPLANT EXCHANGE Right 06/23/2016   Procedure: RIGHT BREAST IMPLANT REMOVAL AND REPLACEMENT;  Surgeon: Cristine Polio, MD;  Location: [facility];  Service: Plastics;  Laterality: Right;  . COLONOSCOPY  2005   2 hyperplastic polyps  . COLONOSCOPY  2007   Dr. Oneida Alar-  hemorrhoids  . COLONOSCOPY  2008   Dr. Reed Pandy polyp- rare sigmoid diverticulosis, internal hemorrhoids  . COLONOSCOPY  12/01/2011   Procedure: COLONOSCOPY;  Surgeon: Danie Binder, MD;  Location: [facility];  Service: Endoscopy;  Laterality: N/A;  12:30 PM  . CYSTOSCOPY/URETEROSCOPY/HOLMIUM LASER/STENT PLACEMENT Left 02/29/2020   Procedure: CYSTOSCOPY/URETEROSCOPY/HOLMIUM LASER/STENT PLACEMENT;  Surgeon: Ceasar Mons, MD;  Location: [facility];  Service: Urology;  Laterality: Left;  . epidural steroid injection     She is getting injections with Dr. Nelva Bush q3wks.  . ESOPHAGOGASTRODUODENOSCOPY (EGD) WITH ESOPHAGEAL DILATION N/A 04/15/2013   Procedure: ESOPHAGOGASTRODUODENOSCOPY (EGD) WITH ESOPHAGEAL DILATION;  Surgeon: Danie Binder, MD;  Location: [facility];  Service: Endoscopy;  Laterality: N/A;  11:45-moved to 12:30 Darius Bump to notify pt  . EYE SURGERY  2010  . EYE SURGERY    . IR KYPHO LUMBAR INC FX REDUCE BONE BX UNI/BIL CANNULATION INC/IMAGING  08/23/2018  . IR RADIOLOGIST EVAL & MGMT  12/23/2018  . right thumb surgery       Allergies  Allergen Reactions  . Epinephrine Anaphylaxis  . Demerol [Meperidine] Other (See Comments)    headaches  . Vicodin [Hydrocodone-Acetaminophen] Other (See Comments)    [name] it makes her crazy. Pt tolerates acetaminophen      Family History  Problem Relation Age of Onset  . Colon cancer Father        age 31  . Prostate cancer Father   . Pancreatic cancer Mother        age 55     Social History Ms. Suh reports that she has never smoked. She has never used smokeless tobacco. Ms. Bost reports no history of alcohol  use.   Review of Systems CONSTITUTIONAL: No weight loss, fever, chills, weakness or fatigue.  HEENT: Eyes: No visual loss, blurred vision, double vision or yellow sclerae.No hearing loss, sneezing, congestion, runny nose or sore throat.  SKIN: No rash or itching.  CARDIOVASCULAR:per hpi  RESPIRATORY: No shortness of breath, cough or sputum.  GASTROINTESTINAL: No anorexia, nausea, vomiting or diarrhea. No abdominal pain or blood.  GENITOURINARY: No burning on urination, no polyuria NEUROLOGICAL: No headache, dizziness, syncope, paralysis, ataxia, numbness or tingling in the extremities. No change in bowel or bladder control.  MUSCULOSKELETAL: No muscle, back pain, joint pain or stiffness.  LYMPHATICS: No enlarged nodes. No history of splenectomy.  PSYCHIATRIC: No history of depression or anxiety.  ENDOCRINOLOGIC: No reports of sweating, cold or heat intolerance. No polyuria or polydipsia.  Marland Kitchen   Physical Examination Today's Vitals   10/11/20 1015  BP: (!) 148/70  Pulse: 66  SpO2: 99%  Weight: 139 lb (63 kg)  Height: 5' (1.524 m)   Body mass index is 27.15 kg/m.  Gen: resting comfortably, no acute distress HEENT: no scleral icterus, pupils equal round and reactive, no palptable cervical adenopathy,  CV: RRR, no m/r/g no jvd Resp: Clear to auscultation bilaterally GI: abdomen is soft, non-tender, non-distended, normal bowel sounds, no hepatosplenomegaly MSK: extremities are warm, no edema.  Skin: warm, no rash Neuro:  no focal deficits Psych: appropriate affect   Diagnostic Studies  Jan 2019 nuclear stress   There was no ST segment deviation noted during stress.  The study is normal. No ischemia or scar.  This is a low risk study.  Nuclear stress EF: 75%.    Jan 2019 echo Study Conclusions   - Left ventricle: The cavity size was normal. Wall thickness was  increased in a pattern  of mild LVH. Systolic function was normal.  The estimated ejection fraction  was in the range of 60% to 65%.  Wall motion was normal; there were no regional wall motion  abnormalities. The study is not technically sufficient to allow  evaluation of LV diastolic function.  - Mitral valve: Mildly calcified annulus. There was trivial  regurgitation.  - Right atrium: Central venous pressure (est): 3 mm Hg.  - Atrial septum: No defect or patent foramen ovale was identified.  - Tricuspid valve: There was mild regurgitation.  - Pulmonary arteries: PA peak pressure: 36 mm Hg (S).  - Pericardium, extracardiac: There was no pericardial effusion.   11/2018 carotid US IMPRESSION: 1. Bilateral carotid bifurcation plaque resulting in less than 50% diameter ICA stenosis. 2. Antegrade bilateral vertebral arterial flow.  11/2018 echo IMPRESSIONS    1. The left ventricle has normal systolic function with an ejection  fraction of 60-65%. The cavity size was normal. There is mildly increased  left ventricular wall thickness. Left ventricular diastolic Doppler  parameters are consistent with  pseudonormalization.  2. The right ventricle has normal systolic function. The cavity was  normal. There is no increase in right ventricular wall thickness. Right  ventricular systolic pressure is normal with an estimated pressure of 28.4  mmHg.  3. Left atrial size was mildly dilated.  4. The mitral valve is grossly normal. Mild calcification of the mitral  valve leaflet. There is moderate mitral annular calcification present.  There is mild mitral regurgitation.  5. The tricuspid valve is grossly normal.  6. The aortic valve has an indeterminate number of cusps. Moderate  calcification of the aortic valve.  7. The aorta is normal in size and structure.    Assessment and Plan   1. Chest pain - unclear etiollogy We will plan for a lexiscan to further evaluate for potential cardiac etiology  If negative stress test, would f/u with cardiollogy just as  needed     Arnoldo Lenis, M.D.

## 2020-10-11 NOTE — Patient Instructions (Signed)
Medication Instructions:  Your physician recommends that you continue on your current medications as directed. Please refer to the Current Medication list given to you today.  *If you need a refill on your cardiac medications before your next appointment, please call your pharmacy*   Lab Work: None If you have labs (blood work) drawn today and your tests are completely normal, you will receive your results only by: Marland Kitchen MyChart Message (if you have MyChart) OR . A paper copy in the mail If you have any lab test that is abnormal or we need to change your treatment, we will call you to review the results.   Testing/Procedures: Your physician has requested that you have a lexiscan myoview. For further information please visit [email or web address]. Please follow instruction sheet, as given.     Follow-Up: At [facility], you and your health needs are our priority.  As part of our continuing mission to provide you with exceptional heart care, we have created designated Provider Care Teams.  These Care Teams include your primary Cardiologist (physician) and Advanced Practice Providers (APPs -  Physician Assistants and Nurse Practitioners) who all work together to provide you with the care you need, when you need it.  We recommend signing up for the patient portal called "MyChart".  Sign up information is provided on this After Visit Summary.  MyChart is used to connect with patients for Virtual Visits (Telemedicine).  Patients are able to view lab/test results, encounter notes, upcoming appointments, etc.  Non-urgent messages can be sent to your provider as well.   To learn more about what you can do with MyChart, go to [email or web address].    Your next appointment:   Follow up pending the results of your Lexiscan.   Other Instructions

## 2020-10-16 ENCOUNTER — Encounter: Payer: Self-pay | Admitting: Neurology

## 2020-10-16 ENCOUNTER — Ambulatory Visit: Payer: PPO | Admitting: Neurology

## 2020-10-16 VITALS — BP 140/72 | HR 63 | Ht 60.0 in | Wt 143.4 lb

## 2020-10-16 DIAGNOSIS — E538 Deficiency of other specified B group vitamins: Secondary | ICD-10-CM

## 2020-10-16 DIAGNOSIS — G40909 Epilepsy, unspecified, not intractable, without status epilepticus: Secondary | ICD-10-CM

## 2020-10-16 DIAGNOSIS — R413 Other amnesia: Secondary | ICD-10-CM | POA: Diagnosis not present

## 2020-10-16 NOTE — Progress Notes (Signed)
Reason for visit: Memory disorder, headaches, cerebrovascular disease, history of seizures  Referring physician: Dr. Crissie [name] [name] is a 85 y.o. female  History of present illness:  [name] is an 85 year old right-handed white female with a history of head trauma at age 21 with subsequent seizures.  The patient indicates that her seizures are usually associated with sudden episodes of confusion and anxiety.  The episodes continue to occur with some regularity, and at times may occur in clusters.  The patient however is not on any medications for seizures.  The patient does not recall exactly when she was taken off of medications for her seizure disorder.  She reports a history of stroke events, the last one occurred about 7 years ago, the patient has had some difficulty with memory since that time that has gradually worsened as well.  She currently lives alone, she does not like to drive longer distances because she has difficulty getting lost.  She is doing her finances but sometimes she will pay the bills twice.  She keeps up with her medications but occasionally she will miss medication.  She keeps up with her own appointments using calendars.  She has a lot of chronic pain issues, she claims that she has had headaches throughout her life since her head trauma.  The headaches tend to be in the frontotemporal area and behind the right eye.  It is not clear from her that the headaches have changed significantly over time.  The patient is unable to tell me exactly how often the headaches occur.  She seems to be a poor historian.  The patient is having a lot of back pain and right leg discomfort and right hip pain.  She has seen Dr. Nelva [name] previously for this.  She has a history of a prior T12 compression fracture.  The patient reports no numbness of extremities, she does have some leg swelling.  She denies any blackout episodes or difficulty controlling the bowels or the bladder.  She  uses a cane to get around, she has not had any recent falls.  She does have a chronic gait instability.  She has decreased visual acuity in the right eye and she has a prism in her glasses.  Past Medical History:  Diagnosis Date  . Adenomatous colon polyp 2008  . Anxiety   . Asthma    has not needed in over a year  . Complication of anesthesia    pt reports TIA and sezures after surgery  . Coronary artery disease   . CVA (cerebral vascular accident) ([place])    x 3  . Degenerative disc disease, lumbar   . Depression   . Diverticulosis   . GERD (gastroesophageal reflux disease)   . 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67   age 27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 2 weeks  . Hemorrhoids 2007  . History of kidney stones   . HTN (hypertension)   . Hyperplastic colon polyp 2005  . Hypothyroidism   . Narcolepsy   . PONV (postoperative nausea and vomiting)   . TIA (transient ischemic attack)     Past Surgical History:  Procedure Laterality Date  . ABDOMINAL HYSTERECTOMY     age 60, complicated by poor wound healing, followed by revision of scar and radiation for ?malignancy  . BACK SURGERY  07/2018   lost 2 inches  . BREAST ENHANCEMENT SURGERY    . BREAST IMPLANT EXCHANGE Right 06/23/2016   Procedure: RIGHT BREAST IMPLANT REMOVAL  AND REPLACEMENT;  Surgeon: Cynthia Polio, [name];  Location: [facility];  Service: Plastics;  Laterality: Right;  . COLONOSCOPY  2005   2 hyperplastic polyps  . COLONOSCOPY  2007   Dr. Oneida [name]- hemorrhoids  . COLONOSCOPY  2008   Dr. Reed [name] polyp- rare sigmoid diverticulosis, internal hemorrhoids  . COLONOSCOPY  12/01/2011   Procedure: COLONOSCOPY;  Surgeon: [name];  Location: [facility];  Service: Endoscopy;  Laterality: N/A;  12:30 PM  . CYSTOSCOPY/URETEROSCOPY/HOLMIUM LASER/STENT PLACEMENT Left 02/29/2020   Procedure: CYSTOSCOPY/URETEROSCOPY/HOLMIUM LASER/STENT PLACEMENT;  Surgeon: [name];  Location: [facility];  Service: Urology;   Laterality: Left;  . epidural steroid injection     She is getting injections with Dr. Nelva [name] q3wks.  . ESOPHAGOGASTRODUODENOSCOPY (EGD) WITH ESOPHAGEAL DILATION N/A 04/15/2013   Procedure: ESOPHAGOGASTRODUODENOSCOPY (EGD) WITH ESOPHAGEAL DILATION;  Surgeon: [name];  Location: [facility];  Service: Endoscopy;  Laterality: N/A;  11:45-moved to 12:30 [name] to notify pt  . EYE SURGERY  2010  . EYE SURGERY    . IR KYPHO LUMBAR INC FX REDUCE BONE BX UNI/BIL CANNULATION INC/IMAGING  08/23/2018  . IR RADIOLOGIST EVAL & MGMT  12/23/2018  . right thumb surgery      Family History  Problem Relation Age of Onset  . Colon cancer Father        age 80  . Prostate cancer Father   . Pancreatic cancer Mother        age 92    Social history:  reports that she has never smoked. She has never used smokeless tobacco. She reports that she does not drink alcohol and does not use drugs.  Medications:  Prior to Admission medications   Medication Sig Start Date End Date Taking? Authorizing Provider  albuterol (PROVENTIL) (2.5 MG/3ML) 0.083% nebulizer solution Take 3 mLs (2.5 mg total) by nebulization every 6 (six) hours as needed for wheezing or shortness of breath. 09/08/18  Yes [name]  albuterol (VENTOLIN HFA) 108 (90 Base) MCG/ACT inhaler Inhale 1-2 puffs into the lungs every 6 (six) hours as needed for wheezing or shortness of breath.   Yes Provider, Historical, [name]  amLODipine (NORVASC) 5 MG tablet Take 1 tablet (5 mg total) by mouth daily. 09/08/18  Yes [name]  Ascorbic Acid (VITAMIN C) 1000 MG tablet Take 1,000 mg by mouth daily.   Yes Provider, Historical, [name]  B Complex Vitamins (VITAMIN B COMPLEX) TABS Take 1 tablet by mouth daily.   Yes Provider, Historical, [name]  Biotin 300 MCG TABS Take 300 mcg by mouth daily after supper.    Yes Provider, Historical, [name]  Calcium Carb-Cholecalciferol (CALCIUM 600 + D PO) Take 1 tablet by mouth in the morning and at bedtime.    Yes Provider, Historical, [name]  conjugated estrogens (PREMARIN) vaginal cream Apply 1 application topically daily as needed.   Yes Provider, Historical, [name]  denosumab (PROLIA) 60 MG/ML SOSY injection Inject 60 mg into the skin every 6 (six) months.   Yes Provider, Historical, [name]  diclofenac Sodium (VOLTAREN) 1 % GEL Apply 2 g topically 4 (four) times daily. 04/11/20  Yes Provider, Historical, [name]  ezetimibe (ZETIA) 10 MG tablet Take 1 tablet (10 mg total) by mouth daily. 09/08/18  Yes [name]  Ferrous Gluconate (IRON) 240 (27 Fe) MG TABS Take 1 tablet by mouth daily. 09/08/18  Yes [name]  Flaxseed, Linseed, (FLAXSEED OIL) 1000 MG  CAPS Take 1,000 mg by mouth daily.    Yes Provider, Historical, [name]  furosemide (LASIX) 40 MG tablet Take 40 mg by mouth as directed. Takes 40 mg am, 20 mg pm   Yes Provider, Historical, [name]  Lecithin 1200 MG CAPS Take 1,200 mg by mouth daily.    Yes Provider, Historical, [name]  levothyroxine (SYNTHROID, LEVOTHROID) 50 MCG tablet Take 1 tablet (50 mcg total) by mouth daily. 09/08/18  Yes [name]  lidocaine (LIDODERM) 5 % 2 patches daily. 01/24/20  Yes Provider, Historical, [name]  Lutein 40 MG CAPS Take 40 mg by mouth daily.  08/24/18  Yes Provider, Historical, [name]  Magnesium 250 MG TABS Take 250 mg by mouth daily.    Yes Provider, Historical, [name]  metoprolol tartrate (LOPRESSOR) 25 MG tablet Take 0.5 tablets (12.5 mg total) by mouth 2 (two) times daily. Take with food / meals Patient taking differently: Take 25 mg by mouth 2 (two) times daily. 12/18/18  Yes Johnson, Clanford L, [name]  Omega-3 Fatty Acids (FISH OIL) 1200 MG CAPS Take 1,200 mg by mouth daily.   Yes Provider, Historical, [name]  pantoprazole (PROTONIX) 40 MG tablet Take 1 tablet (40 mg total) by mouth daily. 12/18/18  Yes Johnson, Clanford L, [name]  potassium chloride (K-DUR) 10 MEQ tablet Take 1 tablet (10 mEq total) by mouth 2 (two) times daily. 09/08/18  Yes [name]  Turmeric 500 MG  TABS Take 500 mg by mouth daily.    Yes Provider, Historical, [name]  Vibegron (GEMTESA) 75 MG TABS Take 75 mg by mouth daily. 11/11/19  Yes Irine Seal, [name]  vitamin E 180 MG (400 UNITS) capsule Take 400 Units by mouth daily.   Yes Provider, Historical, [name]  warfarin (COUMADIN) 4 MG tablet Take 1 tablet (4 mg total) by mouth daily. 09/08/18  Yes [name]  Zinc 50 MG TABS Take 50 mg by mouth daily.    Yes Provider, Historical, [name]      Allergies  Allergen Reactions  . Epinephrine Anaphylaxis  . Demerol [Meperidine] Other (See Comments)    headaches  . Vicodin [Hydrocodone-Acetaminophen] Other (See Comments)    [name] it makes her crazy. Pt tolerates acetaminophen    ROS:  Out of a complete 14 system review of symptoms, the patient complains only of the following symptoms, and all other reviewed systems are negative.  Headache Memory problems Walking difficulty Low back pain, right leg pain History of seizures  Blood pressure 140/72, pulse 63, height 5' (1.524 m), weight 143 lb 6.4 oz (65 kg).  Physical Exam  General: The patient is alert and cooperative at the time of the examination.  Eyes: Pupils are equal, round, and reactive to light. Discs are flat bilaterally.  Neck: The neck is supple, no carotid bruits are noted.  Respiratory: The respiratory examination is clear.  Cardiovascular: The cardiovascular examination reveals a regular rate and rhythm, no obvious murmurs or rubs are noted.  Skin: Extremities are with 2+ edema that is noted below the knees bilaterally.  Neurologic Exam  Mental status: The patient is alert and oriented x 3 at the time of the examination.  The patient appears to have difficulty relating an adequate history, she will follow commands well.  Mini-Mental status examination done today shows a total score 30/30.  Cranial nerves: Facial symmetry is present. There is good sensation of the face to pinprick and soft touch bilaterally. The strength  of the facial muscles and the  muscles to head turning and shoulder shrug are normal bilaterally. Speech is well enunciated, no aphasia or dysarthria is noted. Extraocular movements are full.  On primary gaze, the patient has exotropia of the right eye.  Visual fields are full. The tongue is midline, and the patient has symmetric elevation of the soft palate. No obvious hearing deficits are noted.  Motor: The motor testing reveals 5 over 5 strength of all 4 extremities. Good symmetric motor tone is noted throughout.  Sensory: Sensory testing is intact to pinprick, soft touch, vibration sensation, and position sense on all 4 extremities, with exception some decreased pinprick sensation on the left leg as compared to the right. No evidence of extinction is noted.  Coordination: Cerebellar testing reveals good finger-nose-finger and heel-to-shin bilaterally.  Gait and station: Gait is somewhat wide-based, unsteady.  The patient has a stooped posture.  Patient normally uses a cane for ambulation.  Tandem gait was not attempted.  Romberg is negative but is somewhat unsteady.  Reflexes: Deep tendon reflexes are symmetric and normal bilaterally. Toes are downgoing bilaterally.   Assessment/Plan:  1.  History of seizures, currently not on medication  2.  History of cerebrovascular disease, chronic Coumadin therapy, indications not clear  3.  Reported memory disturbance  4.  Gait disturbance  5.  Chronic frequent headache  The patient will be evaluated for the memory issues.  She is living alone, she is having increasing problems with managing her own affairs.  The patient will be set up for MRI of the brain, she will have an EEG study.  She is not on any medications for seizures.  The patient will follow up in 5 to 6 months.  We will check blood work today.  Jill Alexanders [name] 10/16/2020 11:59 AM  [facility] [address] [place], [place] [address]  Phone  [phone] Fax [phone]

## 2020-10-17 LAB — SEDIMENTATION RATE: Sed Rate: 2 mm/hr (ref 0–40)

## 2020-10-17 LAB — VITAMIN B12: Vitamin B-12: 1895 pg/mL — ABNORMAL HIGH (ref 232–1245)

## 2020-10-17 LAB — RPR: RPR Ser Ql: NONREACTIVE

## 2020-10-23 ENCOUNTER — Telehealth: Payer: Self-pay

## 2020-10-23 DIAGNOSIS — G40909 Epilepsy, unspecified, not intractable, without status epilepticus: Secondary | ICD-10-CM

## 2020-10-23 DIAGNOSIS — R413 Other amnesia: Secondary | ICD-10-CM

## 2020-10-23 NOTE — Telephone Encounter (Signed)
Faxed PA request to [facility], Fax: [phone] and Phone: [phone]. Awaiting determination.

## 2020-10-24 DIAGNOSIS — M8580 Other specified disorders of bone density and structure, unspecified site: Secondary | ICD-10-CM | POA: Insufficient documentation

## 2020-10-24 DIAGNOSIS — E785 Hyperlipidemia, unspecified: Secondary | ICD-10-CM | POA: Insufficient documentation

## 2020-10-24 DIAGNOSIS — E782 Mixed hyperlipidemia: Secondary | ICD-10-CM | POA: Insufficient documentation

## 2020-10-24 DIAGNOSIS — K219 Gastro-esophageal reflux disease without esophagitis: Secondary | ICD-10-CM | POA: Insufficient documentation

## 2020-10-25 DIAGNOSIS — Q6239 Other obstructive defects of renal pelvis and ureter: Secondary | ICD-10-CM | POA: Diagnosis not present

## 2020-10-26 DIAGNOSIS — Z131 Encounter for screening for diabetes mellitus: Secondary | ICD-10-CM | POA: Diagnosis not present

## 2020-10-26 DIAGNOSIS — I10 Essential (primary) hypertension: Secondary | ICD-10-CM | POA: Diagnosis not present

## 2020-10-26 DIAGNOSIS — E039 Hypothyroidism, unspecified: Secondary | ICD-10-CM | POA: Diagnosis not present

## 2020-10-26 DIAGNOSIS — M8580 Other specified disorders of bone density and structure, unspecified site: Secondary | ICD-10-CM | POA: Diagnosis not present

## 2020-10-29 ENCOUNTER — Telehealth: Payer: Self-pay | Admitting: Emergency Medicine

## 2020-10-29 NOTE — Telephone Encounter (Signed)
Called patient and discussed Dr. Tobey Grim findings regarding patient's blood work.    Patient denied further questions, verbalized understanding and expressed appreciation for the phone call.

## 2020-10-29 NOTE — Telephone Encounter (Signed)
-----   Message from Kathrynn Ducking, MD sent at 10/17/2020  6:59 AM EDT -----  The blood work results are unremarkable. Please call the patient.  ----- Message ----- From: Lavone Neri Lab Results In Sent: 10/17/2020   5:38 AM EDT To: Kathrynn Ducking, MD

## 2020-10-30 ENCOUNTER — Ambulatory Visit (HOSPITAL_COMMUNITY)
Admission: RE | Admit: 2020-10-30 | Discharge: 2020-10-30 | Disposition: A | Discharge status: Home / Self Care | Payer: PPO | Source: Ambulatory Visit | Attending: Cardiology | Admitting: Cardiology

## 2020-10-30 ENCOUNTER — Encounter (HOSPITAL_COMMUNITY)
Admission: RE | Admit: 2020-10-30 | Discharge: 2020-10-30 | Disposition: A | Discharge status: Home / Self Care | Payer: PPO | Source: Ambulatory Visit | Attending: Dermatology | Admitting: Dermatology

## 2020-10-30 DIAGNOSIS — R079 Chest pain, unspecified: Secondary | ICD-10-CM | POA: Diagnosis not present

## 2020-10-30 LAB — NM MYOCAR MULTI W/SPECT W/WALL MOTION / EF
LV dias vol: 43 mL (ref 46–106)
LV sys vol: 5 mL
Peak HR: 90 {beats}/min
RATE: 0.48
Rest HR: 58 {beats}/min
SDS: 2
SRS: 5
SSS: 7
TID: 0.87

## 2020-10-30 MED ORDER — TECHNETIUM TC 99M TETROFOSMIN IV KIT
10.0000 | PACK | Freq: Once | INTRAVENOUS | Status: AC | PRN
  Administered 2020-10-30: 11 via INTRAVENOUS

## 2020-10-30 MED ORDER — REGADENOSON 0.4 MG/5ML IV SOLN
INTRAVENOUS | Status: AC
  Administered 2020-10-30: 0.4 mg via INTRAVENOUS
  Filled 2020-10-30: qty 5

## 2020-10-30 MED ORDER — TECHNETIUM TC 99M TETROFOSMIN IV KIT
30.0000 | PACK | Freq: Once | INTRAVENOUS | Status: AC | PRN
  Administered 2020-10-30: 30 via INTRAVENOUS

## 2020-10-30 MED ORDER — SODIUM CHLORIDE FLUSH 0.9 % IV SOLN
INTRAVENOUS | Status: AC
  Administered 2020-10-30: 10 mL via INTRAVENOUS
  Filled 2020-10-30: qty 10

## 2020-11-01 ENCOUNTER — Ambulatory Visit: Payer: PPO | Admitting: Neurology

## 2020-11-01 ENCOUNTER — Telehealth: Payer: Self-pay | Admitting: Neurology

## 2020-11-01 DIAGNOSIS — G40909 Epilepsy, unspecified, not intractable, without status epilepticus: Secondary | ICD-10-CM

## 2020-11-01 NOTE — Telephone Encounter (Signed)
I called the patient.  The EEG study was normal.  MRI of the brain is pending.

## 2020-11-01 NOTE — Procedures (Signed)
    History:  [name] is an 85 year old patient with a history of head trauma at age 80 with subsequent seizures.  Her seizures are usually associated with sudden episodes of confusion and anxiety.  The patient has had some recent problems with memory and some occasional headaches.  She is being evaluated for the above issues.  This is a routine EEG.  No skull defects are noted.  Medications include albuterol, Norvasc, Premarin, Prolia, Zetia, ferrous gluconate, Lasix, Synthroid, magnesium, metoprolol, Protonix, potassium supplementation, turmeric, Gemtesa, warfarin, and zinc supplementation.  EEG classification: Normal awake  Description of the recording: The background rhythms of this recording consists of a fairly well modulated medium amplitude alpha rhythm of 9 Hz that is reactive to eye opening and closure. As the record progresses, the patient appears to remain in the waking state throughout the recording. Photic stimulation was performed, resulting in a bilateral and symmetric photic driving response. Hyperventilation was also performed, resulting in a minimal buildup of the background rhythm activities without significant slowing seen. At no time during the recording does there appear to be evidence of spike or spike wave discharges or evidence of focal slowing. EKG monitor shows no evidence of cardiac rhythm abnormalities with a heart rate of 66.  Impression: This is a normal EEG recording in the waking state. No evidence of ictal or interictal discharges are seen.

## 2020-11-02 DIAGNOSIS — E039 Hypothyroidism, unspecified: Secondary | ICD-10-CM | POA: Diagnosis not present

## 2020-11-02 DIAGNOSIS — N1831 Chronic kidney disease, stage 3a: Secondary | ICD-10-CM | POA: Diagnosis not present

## 2020-11-02 DIAGNOSIS — R600 Localized edema: Secondary | ICD-10-CM | POA: Diagnosis not present

## 2020-11-02 DIAGNOSIS — R109 Unspecified abdominal pain: Secondary | ICD-10-CM | POA: Diagnosis not present

## 2020-11-02 DIAGNOSIS — E782 Mixed hyperlipidemia: Secondary | ICD-10-CM | POA: Diagnosis not present

## 2020-11-02 DIAGNOSIS — J4540 Moderate persistent asthma, uncomplicated: Secondary | ICD-10-CM | POA: Diagnosis not present

## 2020-11-02 DIAGNOSIS — Z7901 Long term (current) use of anticoagulants: Secondary | ICD-10-CM | POA: Diagnosis not present

## 2020-11-02 DIAGNOSIS — I5032 Chronic diastolic (congestive) heart failure: Secondary | ICD-10-CM | POA: Diagnosis not present

## 2020-11-02 DIAGNOSIS — M25551 Pain in right hip: Secondary | ICD-10-CM | POA: Diagnosis not present

## 2020-11-06 NOTE — Telephone Encounter (Signed)
I will change the order.

## 2020-11-06 NOTE — Addendum Note (Signed)
Addended by: Kathrynn Ducking on: 11/06/2020 06:11 PM   Modules accepted: Orders

## 2020-11-06 NOTE — Telephone Encounter (Signed)
NPR for [facility]. I called [facility] scheduling and spoke with Manuela Schwartz since patient wants to schedule MRI at [facility]. She states the order currently in needs to be changed to MRI brain with and without contrast. They are not able to schedule it with only contrast.

## 2020-11-07 NOTE — Telephone Encounter (Signed)
Spoke with scheduling. Patient is scheduled for MRI on June 29th @ 2pm, to arrive at 1:30. I called patient to let her know.

## 2020-11-08 DIAGNOSIS — Z961 Presence of intraocular lens: Secondary | ICD-10-CM | POA: Diagnosis not present

## 2020-11-08 DIAGNOSIS — H52203 Unspecified astigmatism, bilateral: Secondary | ICD-10-CM | POA: Diagnosis not present

## 2020-11-08 DIAGNOSIS — H5203 Hypermetropia, bilateral: Secondary | ICD-10-CM | POA: Diagnosis not present

## 2020-11-08 DIAGNOSIS — H524 Presbyopia: Secondary | ICD-10-CM | POA: Diagnosis not present

## 2020-11-08 DIAGNOSIS — H5711 Ocular pain, right eye: Secondary | ICD-10-CM | POA: Diagnosis not present

## 2020-11-08 DIAGNOSIS — H04123 Dry eye syndrome of bilateral lacrimal glands: Secondary | ICD-10-CM | POA: Diagnosis not present

## 2020-11-12 ENCOUNTER — Other Ambulatory Visit (HOSPITAL_COMMUNITY): Payer: Self-pay | Admitting: Internal Medicine

## 2020-11-12 ENCOUNTER — Other Ambulatory Visit: Payer: Self-pay | Admitting: Internal Medicine

## 2020-11-21 ENCOUNTER — Ambulatory Visit (HOSPITAL_COMMUNITY)
Admission: RE | Admit: 2020-11-21 | Discharge: 2020-11-21 | Disposition: A | Discharge status: Home / Self Care | Payer: PPO | Source: Ambulatory Visit | Attending: Neurology | Admitting: Neurology

## 2020-11-21 ENCOUNTER — Other Ambulatory Visit: Payer: Self-pay

## 2020-11-21 DIAGNOSIS — R413 Other amnesia: Secondary | ICD-10-CM | POA: Insufficient documentation

## 2020-11-21 DIAGNOSIS — G40909 Epilepsy, unspecified, not intractable, without status epilepticus: Secondary | ICD-10-CM | POA: Diagnosis not present

## 2020-11-21 DIAGNOSIS — R569 Unspecified convulsions: Secondary | ICD-10-CM | POA: Diagnosis not present

## 2020-11-21 DIAGNOSIS — G9389 Other specified disorders of brain: Secondary | ICD-10-CM | POA: Diagnosis not present

## 2020-11-21 MED ORDER — GADOBUTROL 1 MMOL/ML IV SOLN
6.0000 mL | Freq: Once | INTRAVENOUS | Status: AC | PRN
  Administered 2020-11-21: 6 mL via INTRAVENOUS

## 2020-11-22 ENCOUNTER — Telehealth: Payer: Self-pay | Admitting: Neurology

## 2020-11-22 MED ORDER — MEMANTINE HCL 5 MG PO TABS: ORAL_TABLET | ORAL | 0 refills | Status: DC

## 2020-11-22 NOTE — Telephone Encounter (Signed)
  I called patient.  The MRI of the brain shows some right frontal encephalomalacia likely related to prior trauma.  There is some evidence of small vessel disease that appears to be fairly chronic in nature, minimal progression from MRI of the brain done in 2013.  We will follow the patient for memory issues, she is amenable to starting Namenda, I would not start Aricept given the history of seizures.  MRI brain 11/22/20:  IMPRESSION: 1. No evidence of acute abnormality. 2. Redemonstrated encephalomalacia in the anteroinferior right frontal lobe and anterolateral right temporal lobe, most likely related to the patient's reported remote trauma. 3. Mild to moderate chronic microvascular ischemic disease and remote bilateral cerebellar lacunar infarcts.

## 2020-12-19 ENCOUNTER — Other Ambulatory Visit (HOSPITAL_COMMUNITY): Payer: Self-pay | Admitting: Internal Medicine

## 2020-12-19 DIAGNOSIS — Z0001 Encounter for general adult medical examination with abnormal findings: Secondary | ICD-10-CM | POA: Diagnosis not present

## 2020-12-19 DIAGNOSIS — J4540 Moderate persistent asthma, uncomplicated: Secondary | ICD-10-CM | POA: Diagnosis not present

## 2020-12-19 DIAGNOSIS — R600 Localized edema: Secondary | ICD-10-CM | POA: Diagnosis not present

## 2020-12-19 DIAGNOSIS — M6281 Muscle weakness (generalized): Secondary | ICD-10-CM | POA: Diagnosis not present

## 2020-12-19 DIAGNOSIS — E039 Hypothyroidism, unspecified: Secondary | ICD-10-CM | POA: Diagnosis not present

## 2020-12-19 DIAGNOSIS — R109 Unspecified abdominal pain: Secondary | ICD-10-CM

## 2020-12-19 DIAGNOSIS — M79604 Pain in right leg: Secondary | ICD-10-CM | POA: Diagnosis not present

## 2020-12-19 DIAGNOSIS — M25551 Pain in right hip: Secondary | ICD-10-CM | POA: Diagnosis not present

## 2020-12-19 DIAGNOSIS — E782 Mixed hyperlipidemia: Secondary | ICD-10-CM | POA: Diagnosis not present

## 2020-12-19 DIAGNOSIS — Z7901 Long term (current) use of anticoagulants: Secondary | ICD-10-CM | POA: Diagnosis not present

## 2020-12-19 DIAGNOSIS — I5032 Chronic diastolic (congestive) heart failure: Secondary | ICD-10-CM | POA: Diagnosis not present

## 2020-12-19 DIAGNOSIS — N1831 Chronic kidney disease, stage 3a: Secondary | ICD-10-CM | POA: Diagnosis not present

## 2020-12-24 ENCOUNTER — Other Ambulatory Visit: Payer: Self-pay | Admitting: *Deleted

## 2020-12-24 DIAGNOSIS — R413 Other amnesia: Secondary | ICD-10-CM

## 2020-12-24 MED ORDER — MEMANTINE HCL 5 MG PO TABS: ORAL_TABLET | ORAL | 2 refills | Status: DC

## 2021-01-02 ENCOUNTER — Encounter (HOSPITAL_COMMUNITY)
Admission: RE | Admit: 2021-01-02 | Discharge: 2021-01-02 | Disposition: A | Discharge status: Home / Self Care | Payer: PPO | Source: Ambulatory Visit | Attending: Internal Medicine | Admitting: Internal Medicine

## 2021-01-02 ENCOUNTER — Encounter (HOSPITAL_COMMUNITY): Payer: Self-pay

## 2021-01-02 ENCOUNTER — Other Ambulatory Visit: Payer: Self-pay

## 2021-01-02 DIAGNOSIS — M810 Age-related osteoporosis without current pathological fracture: Secondary | ICD-10-CM | POA: Insufficient documentation

## 2021-01-02 MED ORDER — DENOSUMAB 60 MG/ML ~~LOC~~ SOSY
60.0000 mg | PREFILLED_SYRINGE | Freq: Once | SUBCUTANEOUS | Status: AC
  Administered 2021-01-02: 60 mg via SUBCUTANEOUS
  Filled 2021-01-02: qty 1

## 2021-01-08 DIAGNOSIS — R109 Unspecified abdominal pain: Secondary | ICD-10-CM | POA: Diagnosis not present

## 2021-01-08 DIAGNOSIS — R600 Localized edema: Secondary | ICD-10-CM | POA: Diagnosis not present

## 2021-01-08 DIAGNOSIS — M6281 Muscle weakness (generalized): Secondary | ICD-10-CM | POA: Diagnosis not present

## 2021-01-08 DIAGNOSIS — N1831 Chronic kidney disease, stage 3a: Secondary | ICD-10-CM | POA: Insufficient documentation

## 2021-01-08 DIAGNOSIS — M25551 Pain in right hip: Secondary | ICD-10-CM | POA: Diagnosis not present

## 2021-01-08 DIAGNOSIS — M79604 Pain in right leg: Secondary | ICD-10-CM | POA: Diagnosis not present

## 2021-01-17 ENCOUNTER — Other Ambulatory Visit (HOSPITAL_COMMUNITY): Payer: Self-pay | Admitting: Internal Medicine

## 2021-01-17 ENCOUNTER — Other Ambulatory Visit: Payer: Self-pay

## 2021-01-17 ENCOUNTER — Ambulatory Visit (HOSPITAL_COMMUNITY)
Admission: RE | Admit: 2021-01-17 | Discharge: 2021-01-17 | Disposition: A | Discharge status: Home / Self Care | Payer: PPO | Source: Ambulatory Visit | Attending: Internal Medicine | Admitting: Internal Medicine

## 2021-01-17 DIAGNOSIS — R109 Unspecified abdominal pain: Secondary | ICD-10-CM

## 2021-01-17 DIAGNOSIS — N1330 Unspecified hydronephrosis: Secondary | ICD-10-CM | POA: Diagnosis not present

## 2021-01-17 DIAGNOSIS — N281 Cyst of kidney, acquired: Secondary | ICD-10-CM | POA: Diagnosis not present

## 2021-01-17 DIAGNOSIS — N200 Calculus of kidney: Secondary | ICD-10-CM | POA: Diagnosis not present

## 2021-01-17 DIAGNOSIS — G8929 Other chronic pain: Secondary | ICD-10-CM | POA: Diagnosis not present

## 2021-01-29 ENCOUNTER — Emergency Department (HOSPITAL_COMMUNITY)
Admission: EM | Admit: 2021-01-29 | Discharge: 2021-01-29 | Disposition: A | Discharge status: Home / Self Care | Payer: PPO | Attending: Emergency Medicine | Admitting: Emergency Medicine

## 2021-01-29 ENCOUNTER — Emergency Department (HOSPITAL_COMMUNITY): Payer: PPO

## 2021-01-29 ENCOUNTER — Encounter (HOSPITAL_COMMUNITY): Payer: Self-pay

## 2021-01-29 ENCOUNTER — Other Ambulatory Visit: Payer: Self-pay

## 2021-01-29 DIAGNOSIS — I480 Paroxysmal atrial fibrillation: Secondary | ICD-10-CM | POA: Diagnosis not present

## 2021-01-29 DIAGNOSIS — I110 Hypertensive heart disease with heart failure: Secondary | ICD-10-CM | POA: Diagnosis not present

## 2021-01-29 DIAGNOSIS — R0602 Shortness of breath: Secondary | ICD-10-CM | POA: Diagnosis not present

## 2021-01-29 DIAGNOSIS — R921 Mammographic calcification found on diagnostic imaging of breast: Secondary | ICD-10-CM | POA: Diagnosis not present

## 2021-01-29 DIAGNOSIS — E039 Hypothyroidism, unspecified: Secondary | ICD-10-CM | POA: Diagnosis not present

## 2021-01-29 DIAGNOSIS — I5032 Chronic diastolic (congestive) heart failure: Secondary | ICD-10-CM | POA: Insufficient documentation

## 2021-01-29 DIAGNOSIS — Z7901 Long term (current) use of anticoagulants: Secondary | ICD-10-CM | POA: Insufficient documentation

## 2021-01-29 DIAGNOSIS — J45909 Unspecified asthma, uncomplicated: Secondary | ICD-10-CM | POA: Insufficient documentation

## 2021-01-29 DIAGNOSIS — Z853 Personal history of malignant neoplasm of breast: Secondary | ICD-10-CM | POA: Diagnosis not present

## 2021-01-29 DIAGNOSIS — Z79899 Other long term (current) drug therapy: Secondary | ICD-10-CM | POA: Insufficient documentation

## 2021-01-29 DIAGNOSIS — J9811 Atelectasis: Secondary | ICD-10-CM | POA: Diagnosis not present

## 2021-01-29 DIAGNOSIS — R1010 Upper abdominal pain, unspecified: Secondary | ICD-10-CM | POA: Diagnosis not present

## 2021-01-29 DIAGNOSIS — I2510 Atherosclerotic heart disease of native coronary artery without angina pectoris: Secondary | ICD-10-CM | POA: Insufficient documentation

## 2021-01-29 DIAGNOSIS — R079 Chest pain, unspecified: Secondary | ICD-10-CM | POA: Diagnosis not present

## 2021-01-29 DIAGNOSIS — I700 Atherosclerosis of aorta: Secondary | ICD-10-CM | POA: Diagnosis not present

## 2021-01-29 DIAGNOSIS — N1330 Unspecified hydronephrosis: Secondary | ICD-10-CM | POA: Diagnosis not present

## 2021-01-29 DIAGNOSIS — R109 Unspecified abdominal pain: Secondary | ICD-10-CM | POA: Diagnosis not present

## 2021-01-29 LAB — COMPREHENSIVE METABOLIC PANEL
ALT: 21 U/L (ref 0–44)
AST: 26 U/L (ref 15–41)
Albumin: 4.1 g/dL (ref 3.5–5.0)
Alkaline Phosphatase: 58 U/L (ref 38–126)
Anion gap: 8 (ref 5–15)
BUN: 25 mg/dL — ABNORMAL HIGH (ref 8–23)
CO2: 31 mmol/L (ref 22–32)
Calcium: 9.8 mg/dL (ref 8.9–10.3)
Chloride: 104 mmol/L (ref 98–111)
Creatinine, Ser: 1.07 mg/dL — ABNORMAL HIGH (ref 0.44–1.00)
GFR, Estimated: 52 mL/min — ABNORMAL LOW (ref >60)
Glucose, Bld: 101 mg/dL — ABNORMAL HIGH (ref 70–99)
Potassium: 4 mmol/L (ref 3.5–5.1)
Sodium: 143 mmol/L (ref 135–145)
Total Bilirubin: 0.6 mg/dL (ref 0.3–1.2)
Total Protein: 6.9 g/dL (ref 6.5–8.1)

## 2021-01-29 LAB — TSH: TSH: 3.716 u[IU]/mL (ref 0.350–4.500)

## 2021-01-29 LAB — CBC
HCT: 45.3 % (ref 36.0–46.0)
Hemoglobin: 14.7 g/dL (ref 12.0–15.0)
MCH: 29.3 pg (ref 26.0–34.0)
MCHC: 32.5 g/dL (ref 30.0–36.0)
MCV: 90.4 fL (ref 80.0–100.0)
Platelets: 254 10*3/uL (ref 150–400)
RBC: 5.01 MIL/uL (ref 3.87–5.11)
RDW: 14.8 % (ref 11.5–15.5)
WBC: 6.9 10*3/uL (ref 4.0–10.5)
nRBC: 0 % (ref 0.0–0.2)

## 2021-01-29 LAB — URINALYSIS, ROUTINE W REFLEX MICROSCOPIC
Bilirubin Urine: NEGATIVE
Glucose, UA: NEGATIVE mg/dL
Hgb urine dipstick: NEGATIVE
Ketones, ur: NEGATIVE mg/dL
Leukocytes,Ua: NEGATIVE
Nitrite: NEGATIVE
Protein, ur: NEGATIVE mg/dL
Specific Gravity, Urine: 1.01 (ref 1.005–1.030)
pH: 7.5 (ref 5.0–8.0)

## 2021-01-29 LAB — PROTIME-INR
INR: 1.4 — ABNORMAL HIGH (ref 0.8–1.2)
Prothrombin Time: 17.6 seconds — ABNORMAL HIGH (ref 11.4–15.2)

## 2021-01-29 LAB — LIPASE, BLOOD: Lipase: 62 U/L — ABNORMAL HIGH (ref 11–51)

## 2021-01-29 LAB — MAGNESIUM: Magnesium: 2.3 mg/dL (ref 1.7–2.4)

## 2021-01-29 LAB — TROPONIN I (HIGH SENSITIVITY)
Troponin I (High Sensitivity): 17 ng/L (ref <18)
Troponin I (High Sensitivity): 54 ng/L — ABNORMAL HIGH (ref <18)

## 2021-01-29 MED ORDER — IOHEXOL 350 MG/ML SOLN
100.0000 mL | Freq: Once | INTRAVENOUS | Status: AC | PRN
  Administered 2021-01-29: 100 mL via INTRAVENOUS

## 2021-01-29 NOTE — ED Provider Notes (Signed)
[facility] EMERGENCY DEPARTMENT Provider Note   CSN: [id number] Arrival date & time: 01/29/21  [id number]     History Chief Complaint  Patient presents with   Abdominal Pain    [name] is a 85 y.o. female.   Abdominal Pain Associated symptoms: chest pain and shortness of breath   Patient presents with chest pain abdominal pain.  States that she felt her heart racing last night.  States felt as if her chest was tight.  Improving somewhat now.  Also has had abdominal pain.  States that bloating has been going on for weeks.  States difficulty eating.  States her upper abdomen will get swollen sometimes.  Not having nausea or vomiting.  Found to be in A. fib upon arrival.  She self converted.  No history of A. fib but is on Coumadin for previous strokes.    Past Medical History:  Diagnosis Date   Adenomatous colon polyp 2008   Anxiety    Asthma    has not needed in over a year   Complication of anesthesia    pt reports TIA and sezures after surgery   Coronary artery disease    CVA (cerebral vascular accident) ([place])    x 3   Degenerative disc disease, lumbar    Depression    Diverticulosis    GERD (gastroesophageal reflux disease)    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44   age 48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 2 weeks   Hemorrhoids 2007   History of kidney stones    HTN (hypertension)    Hyperplastic colon polyp 2005   Hypothyroidism    Narcolepsy    PONV (postoperative nausea and vomiting)    TIA (transient ischemic attack)     Patient Active Problem List   Diagnosis Date Noted   Gastroesophageal reflux disease without esophagitis 10/24/2020   Mixed hyperlipidemia 10/24/2020   Osteopenia 10/24/2020   Urge incontinence 12/09/2019   Mixed incontinence urge and stress (female)(female) 08/05/2019   Renal stones 08/05/2019   UPJ obstruction, 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   Hydronephrosis with ureteropelvic junction (UPJ) obstruction 08/05/2019   Postmenopausal atrophic vaginitis 08/05/2019   Weak  urinary stream 08/05/2019   Acquired thrombophilia ([place]) 06/16/2019   Lumbar spondylosis 02/15/2019   Paresthesia 12/17/2018   Chronic midline thoracic back pain 12/17/2018   Degeneration of lumbar intervertebral disc 12/15/2018   H/O: CVA (cerebrovascular accident) 08/25/2018   Depression, recurrent ([place]) 08/25/2018   T12 compression fracture, with delayed healing, subsequent encounter    Low back pain 08/19/2018   Constipation 08/19/2018   Chronic diastolic CHF (congestive heart failure) ([place]) 08/19/2018   Acquired hypothyroidism 08/19/2018   Intractable pain 08/19/2018   Essential hypertension 08/19/2018   T12 compression fracture ([place]) 08/18/2018   Breast cancer ([place]) 06/23/2016   Change in bowel habits 01/11/2015   Loss of weight 01/11/2015   Abdominal pain, chronic, right upper quadrant 01/11/2015   Unspecified chronic bronchitis ([place]) 09/25/2013   Other dysphagia 04/01/2013   Cough 01/20/2013   Thrush 10/28/2012   Chest pain 09/26/2012   H/O adenomatous polyp of colon 11/18/2011   Chronic anticoagulation 11/18/2011    Past Surgical History:  Procedure Laterality Date   ABDOMINAL HYSTERECTOMY     age 62, complicated by poor wound healing, followed by revision of scar and radiation for ?malignancy   BACK SURGERY  07/2018   lost 2 inches   BREAST ENHANCEMENT SURGERY     BREAST IMPLANT EXCHANGE Right 06/23/2016   Procedure: RIGHT BREAST  IMPLANT REMOVAL AND REPLACEMENT;  Surgeon: Cristine Polio, MD;  Location: [facility];  Service: Plastics;  Laterality: Right;   COLONOSCOPY  2005   2 hyperplastic polyps   COLONOSCOPY  2007   Dr. Oneida Alar- hemorrhoids   COLONOSCOPY  2008   Dr. Reed Pandy polyp- rare sigmoid diverticulosis, internal hemorrhoids   COLONOSCOPY  12/01/2011   Procedure: COLONOSCOPY;  Surgeon: Danie Binder, MD;  [facility];  Service: Endoscopy;  Laterality: N/A;  12:30 PM   CYSTOSCOPY/URETEROSCOPY/HOLMIUM LASER/STENT  PLACEMENT Left 02/29/2020   Procedure: CYSTOSCOPY/URETEROSCOPY/HOLMIUM LASER/STENT PLACEMENT;  Surgeon: Ceasar Mons, MD;  Location: [facility];  Service: Urology;  Laterality: Left;   epidural steroid injection     She is getting injections with Dr. Nelva Bush q3wks.   ESOPHAGOGASTRODUODENOSCOPY (EGD) WITH ESOPHAGEAL DILATION N/A 04/15/2013   Procedure: ESOPHAGOGASTRODUODENOSCOPY (EGD) WITH ESOPHAGEAL DILATION;  Surgeon: Danie Binder, MD;  [facility];  Service: Endoscopy;  Laterality: N/A;  11:45-moved to 12:30 Darius Bump to notify pt   EYE SURGERY  2010   EYE SURGERY     IR KYPHO LUMBAR INC FX REDUCE BONE BX UNI/BIL CANNULATION INC/IMAGING  08/23/2018   IR RADIOLOGIST EVAL & MGMT  12/23/2018   right thumb surgery       OB History     Gravida      Para      Term      Preterm      AB      Living  1      SAB      IAB      Ectopic      Multiple      Live Births              Family History  Problem Relation Age of Onset   Colon cancer Father        age 16   Prostate cancer Father    Pancreatic cancer Mother        age 88    Social History   Tobacco Use   Smoking status: Never   Smokeless tobacco: Never  Vaping Use   Vaping Use: Never used  Substance Use Topics   Alcohol use: No   Drug use: No    Home Medications Prior to Admission medications   Medication Sig Start Date End Date Taking? Authorizing Provider  albuterol (PROVENTIL) (2.5 MG/3ML) 0.083% nebulizer solution Take 3 mLs (2.5 mg total) by nebulization every 6 (six) hours as needed for wheezing or shortness of breath. 09/08/18   Gerlene Fee, NP  albuterol (VENTOLIN HFA) 108 (90 Base) MCG/ACT inhaler Inhale 1-2 puffs into the lungs every 6 (six) hours as needed for wheezing or shortness of breath.    [provider]  amLODipine (NORVASC) 5 MG tablet Take 1 tablet (5 mg total) by mouth daily. 09/08/18   Gerlene Fee, NP  Ascorbic Acid (VITAMIN C) 1000 MG tablet  Take 1,000 mg by mouth daily.    [provider]  B Complex Vitamins (VITAMIN B COMPLEX) TABS Take 1 tablet by mouth daily.    [provider]  Biotin 300 MCG TABS Take 300 mcg by mouth daily after supper.     [provider]  Calcium Carb-Cholecalciferol (CALCIUM 600 + D PO) Take 1 tablet by mouth in the morning and at bedtime.    [provider]  conjugated estrogens (PREMARIN) vaginal cream Apply 1 application topically daily as needed.  [provider]  denosumab (PROLIA) 60 MG/ML SOSY injection Inject 60 mg into the skin every 6 (six) months.    [provider]  diclofenac Sodium (VOLTAREN) 1 % GEL Apply 2 g topically 4 (four) times daily. 04/11/20   [provider]  ezetimibe (ZETIA) 10 MG tablet Take 1 tablet (10 mg total) by mouth daily. 09/08/18   Gerlene Fee, NP  Ferrous Gluconate (IRON) 240 (27 Fe) MG TABS Take 1 tablet by mouth daily. 09/08/18   Gerlene Fee, NP  Flaxseed, Linseed, (FLAXSEED OIL) 1000 MG CAPS Take 1,000 mg by mouth daily.     [provider]  furosemide (LASIX) 40 MG tablet Take 40 mg by mouth as directed. Takes 40 mg am, 20 mg pm    [provider]  Lecithin 1200 MG CAPS Take 1,200 mg by mouth daily.     [provider]  levothyroxine (SYNTHROID, LEVOTHROID) 50 MCG tablet Take 1 tablet (50 mcg total) by mouth daily. 09/08/18   Gerlene Fee, NP  lidocaine (LIDODERM) 5 % 2 patches daily. 01/24/20   [provider]  Lutein 40 MG CAPS Take 40 mg by mouth daily.  08/24/18   [provider]  Magnesium 250 MG TABS Take 250 mg by mouth daily.     [provider]  memantine (NAMENDA) 5 MG tablet Maintenance dose 12/24/20   Kathrynn Ducking, MD  metoprolol tartrate (LOPRESSOR) 25 MG tablet Take 0.5 tablets (12.5 mg total) by mouth 2 (two) times daily. Take with food / meals Patient taking differently: Take 25 mg by mouth 2 (two) times daily. 12/18/18    Johnson, Clanford L, MD  Omega-3 Fatty Acids (FISH OIL) 1200 MG CAPS Take 1,200 mg by mouth daily.    [provider]  pantoprazole (PROTONIX) 40 MG tablet Take 1 tablet (40 mg total) by mouth daily. 12/18/18   Johnson, Clanford L, MD  potassium chloride (K-DUR) 10 MEQ tablet Take 1 tablet (10 mEq total) by mouth 2 (two) times daily. 09/08/18   Gerlene Fee, NP  Turmeric 500 MG TABS Take 500 mg by mouth daily.     [provider]  Vibegron (GEMTESA) 75 MG TABS Take 75 mg by mouth daily. 11/11/19   Irine Seal, MD  vitamin E 180 MG (400 UNITS) capsule Take 400 Units by mouth daily.    [provider]  warfarin (COUMADIN) 4 MG tablet Take 1 tablet (4 mg total) by mouth daily. 09/08/18   Gerlene Fee, NP  Zinc 50 MG TABS Take 50 mg by mouth daily.     [provider]    Allergies    Epinephrine, Demerol [meperidine], and Vicodin [hydrocodone-acetaminophen]  Review of Systems   Review of Systems  Constitutional:  Negative for appetite change.  HENT:  Negative for congestion.   Respiratory:  Positive for shortness of breath.   Cardiovascular:  Positive for chest pain.  Gastrointestinal:  Positive for abdominal distention and abdominal pain.  Genitourinary:  Negative for flank pain.  Musculoskeletal:  Positive for back pain.  Skin:  Negative for rash.  Neurological:  Negative for weakness.  Psychiatric/Behavioral:  Negative for confusion.    Physical Exam Updated Vital Signs BP 131/79 (BP Location: Left Arm)   Pulse 70   Temp 98.6 F (37 C) (Oral)   Resp 17   Ht 5' (1.524 m)   Wt 63.5 kg   SpO2 100%   BMI 27.34 kg/m   Physical  Exam Vitals and nursing note reviewed.  HENT:     Head: Atraumatic.  Cardiovascular:     Rate and Rhythm: Normal rate and regular rhythm.  Pulmonary:     Breath sounds: Normal breath sounds.  Abdominal:     Tenderness: There is abdominal tenderness.     Hernia: No hernia is present.     Comments: Upper  abdominal tenderness out rebound or guarding.  No hernia palpated.  Skin:    General: Skin is warm.     Capillary Refill: Capillary refill takes less than 2 seconds.  Neurological:     Mental Status: She is alert and oriented to person, place, and time.    ED Results / Procedures / Treatments   Labs (all labs ordered are listed, but only abnormal results are displayed) Labs Reviewed  LIPASE, BLOOD - Abnormal; Notable for the following components:      Result Value   Lipase 62 (*)    All other components within normal limits  COMPREHENSIVE METABOLIC PANEL - Abnormal; Notable for the following components:   Glucose, Bld 101 (*)    BUN 25 (*)    Creatinine, Ser 1.07 (*)    GFR, Estimated 52 (*)    All other components within normal limits  PROTIME-INR - Abnormal; Notable for the following components:   Prothrombin Time 17.6 (*)    INR 1.4 (*)    All other components within normal limits  TROPONIN I (HIGH SENSITIVITY) - Abnormal; Notable for the following components:   Troponin I (High Sensitivity) 54 (*)    All other components within normal limits  CBC  URINALYSIS, ROUTINE W REFLEX MICROSCOPIC  MAGNESIUM  TSH  TROPONIN I (HIGH SENSITIVITY)    EKG EKG Interpretation  Date/Time:  Tuesday January 29 2021 09:20:35 EDT Ventricular Rate:  80 PR Interval:  186 QRS Duration: 83 QT Interval:  393 QTC Calculation: 454 R Axis:   -30 Text Interpretation: Sinus rhythm Left axis deviation Low voltage, precordial leads Consider anterior infarct afib has resolved Confirmed by Davonna Belling [phone]) on 01/29/2021 9:27:25 AM  Radiology DG Chest Portable 1 View  Result Date: 01/29/2021 CLINICAL DATA:  85 year old female with history of chest pain and shortness of breath. EXAM: PORTABLE CHEST 1 VIEW COMPARISON:  Chest x-ray 05/12/2020. FINDINGS: Lung volumes are normal. No consolidative airspace disease. No pleural effusions. No pneumothorax. No pulmonary nodule or mass noted.  Pulmonary vasculature and the cardiomediastinal silhouette are within normal limits. Atherosclerosis in the thoracic aorta. Densely calcified left-sided breast implant again incidentally noted. Post vertebroplasty changes are also noted in the lower thoracic spine. IMPRESSION: 1.  No radiographic evidence of acute cardiopulmonary disease. 2. Aortic atherosclerosis. Electronically Signed   By: Vinnie Langton M.D.   On: 01/29/2021 10:29   CT Angio Abd/Pel W and/or Wo Contrast  Result Date: 01/29/2021 CLINICAL DATA:  85 year old female with abdominal pain and concern for mesenteric ischemia EXAM: CT ANGIOGRAPHY ABDOMEN AND PELVIS WITH CONTRAST AND WITHOUT CONTRAST TECHNIQUE: Multidetector CT imaging of the abdomen and pelvis was performed using the standard protocol during bolus administration of intravenous contrast. Multiplanar reconstructed images and MIPs were obtained and reviewed to evaluate the vascular anatomy. CONTRAST:  142m OMNIPAQUE IOHEXOL 350 MG/ML SOLN COMPARISON:  01/17/2021 FINDINGS: VASCULAR Aorta: Atherosclerosis without signficant stenosis, dissection, or aneurysm. Celiac: Mild ostial stenosis secondary to atherosclerotic plaque. Patent distally. Accessory left hepatic artery arising from the left gastric artery. SMA: Patent without evidence of aneurysm, dissection, vasculitis or significant stenosis. Renals:  Single bilateral renal arteries. There is mild ostial stenosis of the left renal artery secondary to atherosclerotic plaque. The right renal artery is widely patent. IMA: Patent without evidence of aneurysm, dissection, vasculitis or significant stenosis. Inflow: Moderate proximal stenosis of the right common iliac artery secondary to fibrofatty and calcific atherosclerotic plaque. The remaining visualized better lateral inflow vessels are widely patent with scattered atherosclerotic calcifications. Proximal Outflow: Bilateral common femoral and visualized portions of the superficial and  profunda femoral arteries are patent without evidence of aneurysm, dissection, vasculitis or significant stenosis. Veins: The a patent veins are patent. The portal system is widely patent and normal in caliber. The renal veins are patent with conventional anatomic configuration. No evidence of iliocaval thrombosis or anomaly. Review of the MIP images confirms the above findings. NON-VASCULAR Lower chest: No acute abnormality. Incompletely visualized bilateral breast implants. Biatrial cardiomegaly is noted. Scattered bibasilar subsegmental atelectasis. Hepatobiliary: Similar appearance of multifocal hypoattenuating masses in the bilateral lobes of the liver. Several scattered foci of arterial enhancement, absent on portal venous phase. No suspicious hepatic masses. The gallbladder is present and unremarkable. No intra or extrahepatic biliary ductal dilation. Pancreas: Unremarkable. No pancreatic ductal dilatation or surrounding inflammatory changes. Spleen: Normal in size without focal abnormality. Adrenals/Urinary Tract: Adrenal glands are unremarkable. Unchanged appearance of a subcentimeter hemorrhagic renal cyst in the inferior pole left kidney. Bilateral extrarenal pelves are again seen with mild left hydronephrosis, unchanged from comparison. Bladder is unremarkable. Stomach/Bowel: Stomach is within normal limits. Appendix is not definitively identified. Sigmoid diverticula without surrounding inflammatory changes. No evidence of bowel wall thickening, distention, or inflammatory changes. Lymphatic: No abdominopelvic lymphadenopathy. Reproductive: Status post hysterectomy. No adnexal masses. Other: No abdominal wall hernia or abnormality. No abdominopelvic ascites. Musculoskeletal: Similar-appearing postprocedural changes after T12 vertebral body cement augmentation. No new vertebral body height loss or acute osseous abnormality. Multilevel degenerative changes visualized thoracolumbar spine most pronounced at  L5-S1. IMPRESSION: VASCULAR 1. No acute vascular abnormality or evidence of mesenteric ischemia. 2.  Aortic Atherosclerosis (ICD10-I70.0). NON-VASCULAR 1. No acute abnormality in the abdomen or pelvis. 2. Unchanged scattered hepatic hemangiomas and simple cysts. 3. Unchanged chronic left hydronephrosis/extrarenal pelvis. 4. Similar-appearing postprocedural changes after T12 vertebral body cement augmentation without complicating features. Ruthann Cancer, MD [facility] Radiology Electronically Signed   By: Ruthann Cancer M.D.   On: 01/29/2021 11:47    Procedures Procedures   Medications Ordered in ED Medications  iohexol (OMNIPAQUE) 350 MG/ML injection 100 mL (100 mLs Intravenous Contrast Given 01/29/21 1014)    ED Course  I have reviewed the triage vital signs and the nursing notes.  Pertinent labs & imaging results that were available during my care of the patient were reviewed by me and considered in my medical decision making (see chart for details).    MDM Rules/Calculators/A&P                          Chadsvasc 7  Patient presents with chest pain abdominal pain.  Upon arrival on initial EKG found to be in atrial fibrillation with RVR.  No history of same.  However patient spontaneously converted.  High risk by CHA2DS2-VASc score, but already on Coumadin for strokes.  CT scan done due to potential for bowel ischemia due to the A. fib.  It was reassuring.  Feels better not in the A. fib.  Doubt cardiac ischemia.  Discharge home with outpatient follow-up.  Does not appear to need  admission to the hospital.  I reviewed imaging and laboratory.  Final Clinical Impression(s) / ED Diagnoses Final diagnoses:  Paroxysmal atrial fibrillation [facility])    Rx / DC Orders ED Discharge Orders     None        Davonna Belling, MD 01/30/21 1320

## 2021-01-29 NOTE — ED Triage Notes (Signed)
Pt to er room number 5 via wc, pt states that she hurts all over for the past three years since she broke her back, states that she is here today for abd pain and bloating since 545am.  Pt states that she feels like she is going to explode, states that she has had the bloating for the past few weeks.

## 2021-01-29 NOTE — Discharge Instructions (Addendum)
Continue your warfarin.  Follow-up with cardiology for the atrial fibrillation.  You can return to the ER if needed if your heart continues to run fast.

## 2021-01-30 ENCOUNTER — Ambulatory Visit: Payer: PPO | Admitting: Orthopedic Surgery

## 2021-01-31 ENCOUNTER — Encounter: Payer: Self-pay | Admitting: Orthopedic Surgery

## 2021-01-31 ENCOUNTER — Ambulatory Visit: Payer: PPO

## 2021-01-31 ENCOUNTER — Ambulatory Visit (INDEPENDENT_AMBULATORY_CARE_PROVIDER_SITE_OTHER): Payer: PPO | Admitting: Orthopedic Surgery

## 2021-01-31 ENCOUNTER — Other Ambulatory Visit: Payer: Self-pay

## 2021-01-31 VITALS — BP 142/68 | HR 63 | Ht 60.0 in | Wt 140.0 lb

## 2021-01-31 DIAGNOSIS — M25551 Pain in right hip: Secondary | ICD-10-CM

## 2021-01-31 DIAGNOSIS — M545 Low back pain, unspecified: Secondary | ICD-10-CM

## 2021-01-31 NOTE — Patient Instructions (Signed)
Dr Rolena Infante office will be sent the referral, will take several weeks for his office to call you, if you prefer not to wait so long you can call to schedule, the number is [phone]

## 2021-01-31 NOTE — Progress Notes (Signed)
Chief Complaint  Patient presents with   Hip Pain    Right painful with damp weather painful changing positions sitting to standing and standing to walking    New patient to the practice  85 year old female status post or history of a stroke, currently on Coumadin previously seen by Dr. Herma Mering for vertebroplasty and back pain presents to [place] for evaluation of ongoing pain in her right hip.  She has pain in the right side of her lower back with tingling and radiation to the right leg she did have epidural injections but they did not help she complains of weakness on the right lower extremity  Examination reveals spinal deformity with positive palpable tenderness in the lumbar spine she does use a cane  She has no motor weakness in her lower extremities normal range of motion without pain in both hips normal reflexes and negative straight leg raises  Her right hip x-ray with pelvis shows a normal hip  The lumbar spine film shows a spinal deformity and degenerative disc disease with spondylosis  Her MRI from 2021 report indicates  Chronic T12 compression fracture status post vertebral augmentation L4-5 synovial cyst on the right this encroaches upon the right posterolateral spinal canal contacting multiple descending nerve roots with mild central canal narrowing, she has lumbar spondylosis unchanged from MRI noted 08/19/2018 no other spinal stenosis of any significance noted on the scan she has multifactorial mild to moderate L5-S1 left neuroforaminal narrowing  There is an L5 1 disc bulge with endplate spurring  [id number]: Small disc bulge asymmetric to the right. Superimposed shallow broad-based right center to right foraminal disc protrusion. Moderate facet arthrosis with mild ligamentum flavum hypertrophy. Mild relative right subarticular narrowing without nerve root impingement. Central canal patent. No significant neural foraminal narrowing.   L4-L5: Small disc bulge with endplate  spurring. Moderate facet arthrosis/ligamentum flavum hypertrophy. New from prior MRI, there is a 6 mm ventrally projecting synovial facet cyst on the right. The synovial facet cyst encroaches upon the right posterolateral aspect of the spinal canal, contacting multiple descending right-sided nerve roots. Mild relative narrowing of the central canal. No significant neural foraminal narrowing.   L5-S1: Disc bulge with endplate spurring. Moderate facet arthrosis with ligamentum flavum hypertrophy. No significant spinal canal stenosis. Mild/moderate left neural foraminal narrowing.  The patient has had the epidurals she says she even had therapy so I do not think there is anything I can do for her from a spine standpoint  Her hip charts are normal so no interventions are needed there  She was seen by Dr. Herma Mering so I am going to refer her back to that practice to see Dr. Rolena Infante for further management evaluation and recommendation

## 2021-02-01 DIAGNOSIS — M545 Low back pain, unspecified: Secondary | ICD-10-CM | POA: Diagnosis not present

## 2021-02-01 DIAGNOSIS — I480 Paroxysmal atrial fibrillation: Secondary | ICD-10-CM | POA: Insufficient documentation

## 2021-02-01 DIAGNOSIS — I10 Essential (primary) hypertension: Secondary | ICD-10-CM | POA: Diagnosis not present

## 2021-02-21 ENCOUNTER — Encounter: Payer: Self-pay | Admitting: Cardiology

## 2021-02-21 ENCOUNTER — Ambulatory Visit: Payer: PPO | Admitting: Cardiology

## 2021-02-21 VITALS — BP 118/74 | HR 56 | Ht 60.0 in | Wt 141.8 lb

## 2021-02-21 DIAGNOSIS — I480 Paroxysmal atrial fibrillation: Secondary | ICD-10-CM

## 2021-02-21 MED ORDER — METOPROLOL TARTRATE 25 MG PO TABS: 25.0000 mg | ORAL_TABLET | Freq: Two times a day (BID) | ORAL | 6 refills | Status: DC

## 2021-02-21 NOTE — Progress Notes (Signed)
Clinical Summary Ms. Pedone is a 85 y.o.female seen today for follow up of the following medical problems.   This is a focused visit for recent ER visit with new diagnosed paroxysmal afib.    1. Palpitations/ New diagnosis of paroxysmal afib - seen in ER 01/29/21 with abdominal pain, palpitations. Awoke her from sleep.  - found to be in afib, new diagnosis for her. Self converted in ER - already on coumadin - 11/2018 echo LVEF 60-65%, mild LAE, mild MR -has been on lopressor 25mg  bid for a number of years  2. CVA/TIAs - reports this is why she is coumadin, INRs followed by pcp.   Past Medical History:  Diagnosis Date   Adenomatous colon polyp 2008   Anxiety    Asthma    has not needed in over a year   Complication of anesthesia    pt reports TIA and sezures after surgery   Coronary artery disease    CVA (cerebral vascular accident) ([place])    x 3   Degenerative disc disease, lumbar    Depression    Diverticulosis    GERD (gastroesophageal reflux disease)    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84   age 39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 2 weeks   Hemorrhoids 2007   History of kidney stones    HTN (hypertension)    Hyperplastic colon polyp 2005   Hypothyroidism    Narcolepsy    PONV (postoperative nausea and vomiting)    TIA (transient ischemic attack)      Allergies  Allergen Reactions   Epinephrine Anaphylaxis   Hydrocodone-Acetaminophen    Demerol [Meperidine] Other (See Comments)    headaches   Vicodin [Hydrocodone-Acetaminophen] Other (See Comments)    Said it makes her crazy. Pt tolerates acetaminophen     Current Outpatient Medications  Medication Sig Dispense Refill   albuterol (PROVENTIL) (2.5 MG/3ML) 0.083% nebulizer solution Take 3 mLs (2.5 mg total) by nebulization every 6 (six) hours as needed for wheezing or shortness of breath. 75 mL 0   albuterol (VENTOLIN HFA) 108 (90 Base) MCG/ACT inhaler Inhale 1-2 puffs into the lungs every 6 (six) hours as needed for wheezing or  shortness of breath.     amLODipine (NORVASC) 5 MG tablet Take 1 tablet (5 mg total) by mouth daily. 30 tablet 0   Ascorbic Acid (VITAMIN C) 1000 MG tablet Take 1,000 mg by mouth daily.     B Complex Vitamins (VITAMIN B COMPLEX) TABS Take 1 tablet by mouth daily.     Biotin 300 MCG TABS Take 300 mcg by mouth daily after supper.      Calcium Carb-Cholecalciferol (CALCIUM 600 + D PO) Take 1 tablet by mouth in the morning and at bedtime.     conjugated estrogens (PREMARIN) vaginal cream Apply 1 application topically daily as needed.     denosumab (PROLIA) 60 MG/ML SOSY injection Inject 60 mg into the skin every 6 (six) months.     diclofenac Sodium (VOLTAREN) 1 % GEL Apply 2 g topically 4 (four) times daily.     ezetimibe (ZETIA) 10 MG tablet Take 1 tablet (10 mg total) by mouth daily. 30 tablet 0   Ferrous Gluconate (IRON) 240 (27 Fe) MG TABS Take 1 tablet by mouth daily. 30 tablet 0   Flaxseed, Linseed, (FLAXSEED OIL) 1000 MG CAPS Take 1,000 mg by mouth daily.      furosemide (LASIX) 40 MG tablet Take 40 mg by mouth as directed. Takes 40  mg am, 20 mg pm     Lecithin 1200 MG CAPS Take 1,200 mg by mouth daily.      levothyroxine (SYNTHROID, LEVOTHROID) 50 MCG tablet Take 1 tablet (50 mcg total) by mouth daily. 30 tablet 0   lidocaine (LIDODERM) 5 % 2 patches daily.     Lutein 40 MG CAPS Take 40 mg by mouth daily.      Magnesium 250 MG TABS Take 250 mg by mouth daily.      memantine (NAMENDA) 5 MG tablet Maintenance dose 120 tablet 2   metoprolol tartrate (LOPRESSOR) 25 MG tablet Take 0.5 tablets (12.5 mg total) by mouth 2 (two) times daily. Take with food / meals (Patient taking differently: Take 25 mg by mouth 2 (two) times daily.) 30 tablet 0   Omega-3 Fatty Acids (FISH OIL) 1200 MG CAPS Take 1,200 mg by mouth daily.     pantoprazole (PROTONIX) 40 MG tablet Take 1 tablet (40 mg total) by mouth daily.     potassium chloride (K-DUR) 10 MEQ tablet Take 1 tablet (10 mEq total) by mouth 2 (two) times  daily. 60 tablet 0   Turmeric 500 MG TABS Take 500 mg by mouth daily.      Vibegron (GEMTESA) 75 MG TABS Take 75 mg by mouth daily. 28 tablet 0   vitamin E 180 MG (400 UNITS) capsule Take 400 Units by mouth daily.     warfarin (COUMADIN) 4 MG tablet Take 1 tablet (4 mg total) by mouth daily. 30 tablet 0   Zinc 50 MG TABS Take 50 mg by mouth daily.      No current facility-administered medications for this visit.     Past Surgical History:  Procedure Laterality Date   ABDOMINAL HYSTERECTOMY     age 75, complicated by poor wound healing, followed by revision of scar and radiation for ?malignancy   BACK SURGERY  07/2018   lost 2 inches   BREAST ENHANCEMENT SURGERY     BREAST IMPLANT EXCHANGE Right 06/23/2016   Procedure: RIGHT BREAST IMPLANT REMOVAL AND REPLACEMENT;  Surgeon: Cristine Polio, MD;  Location: [facility];  Service: Plastics;  Laterality: Right;   COLONOSCOPY  2005   2 hyperplastic polyps   COLONOSCOPY  2007   Dr. Oneida Alar- hemorrhoids   COLONOSCOPY  2008   Dr. Reed Pandy polyp- rare sigmoid diverticulosis, internal hemorrhoids   COLONOSCOPY  12/01/2011   Procedure: COLONOSCOPY;  Surgeon: Danie Binder, MD;  Location: [facility];  Service: Endoscopy;  Laterality: N/A;  12:30 PM   CYSTOSCOPY/URETEROSCOPY/HOLMIUM LASER/STENT PLACEMENT Left 02/29/2020   Procedure: CYSTOSCOPY/URETEROSCOPY/HOLMIUM LASER/STENT PLACEMENT;  Surgeon: Ceasar Mons, MD;  Location: [facility];  Service: Urology;  Laterality: Left;   epidural steroid injection     She is getting injections with Dr. Nelva Bush q3wks.   ESOPHAGOGASTRODUODENOSCOPY (EGD) WITH ESOPHAGEAL DILATION N/A 04/15/2013   Procedure: ESOPHAGOGASTRODUODENOSCOPY (EGD) WITH ESOPHAGEAL DILATION;  Surgeon: Danie Binder, MD;  Location: [facility];  Service: Endoscopy;  Laterality: N/A;  11:45-moved to 12:30 Darius Bump to notify pt   EYE SURGERY  2010   EYE SURGERY     IR KYPHO LUMBAR INC FX REDUCE BONE BX  UNI/BIL CANNULATION INC/IMAGING  08/23/2018   IR RADIOLOGIST EVAL & MGMT  12/23/2018   right thumb surgery       Allergies  Allergen Reactions   Epinephrine Anaphylaxis   Hydrocodone-Acetaminophen    Demerol [Meperidine] Other (See Comments)    headaches   Vicodin [Hydrocodone-Acetaminophen]  Other (See Comments)    [name] it makes her crazy. Pt tolerates acetaminophen      Family History  Problem Relation Age of Onset   Colon cancer Father        age 26   Prostate cancer Father    Pancreatic cancer Mother        age 40     Social History Ms. Karan reports that she has never smoked. She has never used smokeless tobacco. Ms. Dimascio reports no history of alcohol use.   Review of Systems CONSTITUTIONAL: No weight loss, fever, chills, weakness or fatigue.  HEENT: Eyes: No visual loss, blurred vision, double vision or yellow sclerae.No hearing loss, sneezing, congestion, runny nose or sore throat.  SKIN: No rash or itching.  CARDIOVASCULAR: per hpi RESPIRATORY: No shortness of breath, cough or sputum.  GASTROINTESTINAL: No anorexia, nausea, vomiting or diarrhea. No abdominal pain or blood.  GENITOURINARY: No burning on urination, no polyuria NEUROLOGICAL: No headache, dizziness, syncope, paralysis, ataxia, numbness or tingling in the extremities. No change in bowel or bladder control.  MUSCULOSKELETAL: No muscle, back pain, joint pain or stiffness.  LYMPHATICS: No enlarged nodes. No history of splenectomy.  PSYCHIATRIC: No history of depression or anxiety.  ENDOCRINOLOGIC: No reports of sweating, cold or heat intolerance. No polyuria or polydipsia.  Marland Kitchen   Physical Examination Today's Vitals   02/21/21 1018  BP: 118/74  Pulse: (!) 56  SpO2: 99%  Weight: 141 lb 12.8 oz (64.3 kg)  Height: 5' (1.524 m)   Body mass index is 27.69 kg/m.  Gen: resting comfortably, no acute distress HEENT: no scleral icterus, pupils equal round and reactive, no palptable cervical  adenopathy,  CV: RRR, no m/r/g, no jvd  Resp: Clear to auscultation bilaterally GI: abdomen is soft, non-tender, non-distended, normal bowel sounds, no hepatosplenomegaly MSK: extremities are warm, no edema.  Skin: warm, no rash Neuro:  no focal deficits Psych: appropriate affect   Diagnostic Studies  Jan 2019 nuclear stress   There was no ST segment deviation noted during stress. The study is normal. No ischemia or scar. This is a low risk study. Nuclear stress EF: 75%.       Jan 2019 echo Study Conclusions   - Left ventricle: The cavity size was normal. Wall thickness was    increased in a pattern of mild LVH. Systolic function was normal.    The estimated ejection fraction was in the range of 60% to 65%.    Wall motion was normal; there were no regional wall motion    abnormalities. The study is not technically sufficient to allow    evaluation of LV diastolic function.  - Mitral valve: Mildly calcified annulus. There was trivial    regurgitation.  - Right atrium: Central venous pressure (est): 3 mm Hg.  - Atrial septum: No defect or patent foramen ovale was identified.  - Tricuspid valve: There was mild regurgitation.  - Pulmonary arteries: PA peak pressure: 36 mm Hg (S).  - Pericardium, extracardiac: There was no pericardial effusion.    11/2018 carotid US IMPRESSION: 1. Bilateral carotid bifurcation plaque resulting in less than 50% diameter ICA stenosis. 2. Antegrade bilateral vertebral arterial flow.   11/2018 echo IMPRESSIONS     1. The left ventricle has normal systolic function with an ejection  fraction of 60-65%. The cavity size was normal. There is mildly increased  left ventricular wall thickness. Left ventricular diastolic Doppler  parameters are consistent with  pseudonormalization.   2. The  right ventricle has normal systolic function. The cavity was  normal. There is no increase in right ventricular wall thickness. Right  ventricular systolic  pressure is normal with an estimated pressure of 28.4  mmHg.   3. Left atrial size was mildly dilated.   4. The mitral valve is grossly normal. Mild calcification of the mitral  valve leaflet. There is moderate mitral annular calcification present.  There is mild mitral regurgitation.   5. The tricuspid valve is grossly normal.   6. The aortic valve has an indeterminate number of cusps. Moderate  calcification of the aortic valve.   7. The aorta is normal in size and structure.    10/2019 nuclear stress Lexiscan stress is electrically negative for ischemia Myoview scan shows normal perfusion No ischemia or scar LVEF calculated at 88% Low risk study   Assessment and Plan  1.PAF - new diagnosis during recent ER admission, she self converted - no recurrent symptoms, by history episodes seem infrequent - she is already on lopressor 25mg  bid. Low baseline HR, would not titrate dose, add additional 25mg  as needed dose - if symptoms progress would refer to EP to consider antiarrhythmic as I don't think would tolerate higher daily av nodal agent dosing -she had already been on coumadin for prior TIAs/CVAs in the past started by another provider, continue anticoag. Her CHADS2Vasc score is 6 (CHF,HTN, age x 2, stoke x2). We discussed DOACs, she has some concerns about possible cost but will check with her pharmacy.       Arnoldo Lenis, M.D.

## 2021-02-21 NOTE — Patient Instructions (Addendum)
Medication Instructions:  Increase Lopressor to 25mg  twice a day.  May take an additional 25mg  as needed for palpitations.  Continue all other medications.     Labwork: none  Testing/Procedures: none  Follow-Up: 6 months   Any Other Special Instructions Will Be Listed Below (If Applicable).   If you need a refill on your cardiac medications before your next appointment, please call your pharmacy.

## 2021-02-26 DIAGNOSIS — I5032 Chronic diastolic (congestive) heart failure: Secondary | ICD-10-CM | POA: Diagnosis not present

## 2021-02-26 DIAGNOSIS — I70203 Unspecified atherosclerosis of native arteries of extremities, bilateral legs: Secondary | ICD-10-CM | POA: Diagnosis not present

## 2021-02-26 DIAGNOSIS — Z515 Encounter for palliative care: Secondary | ICD-10-CM | POA: Diagnosis not present

## 2021-02-26 DIAGNOSIS — D6869 Other thrombophilia: Secondary | ICD-10-CM | POA: Diagnosis not present

## 2021-02-26 DIAGNOSIS — N183 Chronic kidney disease, stage 3 unspecified: Secondary | ICD-10-CM | POA: Diagnosis not present

## 2021-02-26 DIAGNOSIS — F339 Major depressive disorder, recurrent, unspecified: Secondary | ICD-10-CM | POA: Diagnosis not present

## 2021-02-26 DIAGNOSIS — Z66 Do not resuscitate: Secondary | ICD-10-CM | POA: Diagnosis not present

## 2021-02-26 DIAGNOSIS — D692 Other nonthrombocytopenic purpura: Secondary | ICD-10-CM | POA: Diagnosis not present

## 2021-02-26 DIAGNOSIS — E039 Hypothyroidism, unspecified: Secondary | ICD-10-CM | POA: Diagnosis not present

## 2021-02-26 DIAGNOSIS — I130 Hypertensive heart and chronic kidney disease with heart failure and stage 1 through stage 4 chronic kidney disease, or unspecified chronic kidney disease: Secondary | ICD-10-CM | POA: Diagnosis not present

## 2021-02-26 DIAGNOSIS — J45909 Unspecified asthma, uncomplicated: Secondary | ICD-10-CM | POA: Diagnosis not present

## 2021-02-26 DIAGNOSIS — I480 Paroxysmal atrial fibrillation: Secondary | ICD-10-CM | POA: Diagnosis not present

## 2021-02-28 DIAGNOSIS — Z23 Encounter for immunization: Secondary | ICD-10-CM | POA: Diagnosis not present

## 2021-02-28 DIAGNOSIS — I10 Essential (primary) hypertension: Secondary | ICD-10-CM | POA: Diagnosis not present

## 2021-02-28 DIAGNOSIS — I480 Paroxysmal atrial fibrillation: Secondary | ICD-10-CM | POA: Diagnosis not present

## 2021-02-28 DIAGNOSIS — M545 Low back pain, unspecified: Secondary | ICD-10-CM | POA: Diagnosis not present

## 2021-03-05 DIAGNOSIS — M5451 Vertebrogenic low back pain: Secondary | ICD-10-CM | POA: Diagnosis not present

## 2021-03-11 DIAGNOSIS — M5441 Lumbago with sciatica, right side: Secondary | ICD-10-CM | POA: Diagnosis not present

## 2021-03-11 DIAGNOSIS — M5431 Sciatica, right side: Secondary | ICD-10-CM | POA: Diagnosis not present

## 2021-03-11 DIAGNOSIS — M25651 Stiffness of right hip, not elsewhere classified: Secondary | ICD-10-CM | POA: Diagnosis not present

## 2021-03-11 DIAGNOSIS — M25551 Pain in right hip: Secondary | ICD-10-CM | POA: Diagnosis not present

## 2021-03-14 DIAGNOSIS — M545 Low back pain, unspecified: Secondary | ICD-10-CM | POA: Diagnosis not present

## 2021-03-14 DIAGNOSIS — I10 Essential (primary) hypertension: Secondary | ICD-10-CM | POA: Diagnosis not present

## 2021-03-14 DIAGNOSIS — M940 Chondrocostal junction syndrome [Tietze]: Secondary | ICD-10-CM | POA: Diagnosis not present

## 2021-03-14 DIAGNOSIS — I480 Paroxysmal atrial fibrillation: Secondary | ICD-10-CM | POA: Diagnosis not present

## 2021-03-14 DIAGNOSIS — M6281 Muscle weakness (generalized): Secondary | ICD-10-CM | POA: Diagnosis not present

## 2021-03-15 DIAGNOSIS — M25651 Stiffness of right hip, not elsewhere classified: Secondary | ICD-10-CM | POA: Diagnosis not present

## 2021-03-15 DIAGNOSIS — M5431 Sciatica, right side: Secondary | ICD-10-CM | POA: Diagnosis not present

## 2021-03-15 DIAGNOSIS — M25551 Pain in right hip: Secondary | ICD-10-CM | POA: Diagnosis not present

## 2021-03-15 DIAGNOSIS — M5441 Lumbago with sciatica, right side: Secondary | ICD-10-CM | POA: Diagnosis not present

## 2021-03-19 DIAGNOSIS — M5441 Lumbago with sciatica, right side: Secondary | ICD-10-CM | POA: Diagnosis not present

## 2021-03-19 DIAGNOSIS — M25551 Pain in right hip: Secondary | ICD-10-CM | POA: Diagnosis not present

## 2021-03-19 DIAGNOSIS — M5431 Sciatica, right side: Secondary | ICD-10-CM | POA: Diagnosis not present

## 2021-03-19 DIAGNOSIS — M25651 Stiffness of right hip, not elsewhere classified: Secondary | ICD-10-CM | POA: Diagnosis not present

## 2021-03-22 DIAGNOSIS — M25551 Pain in right hip: Secondary | ICD-10-CM | POA: Diagnosis not present

## 2021-03-22 DIAGNOSIS — M5441 Lumbago with sciatica, right side: Secondary | ICD-10-CM | POA: Diagnosis not present

## 2021-03-22 DIAGNOSIS — M25651 Stiffness of right hip, not elsewhere classified: Secondary | ICD-10-CM | POA: Diagnosis not present

## 2021-03-22 DIAGNOSIS — M5431 Sciatica, right side: Secondary | ICD-10-CM | POA: Diagnosis not present

## 2021-03-25 DIAGNOSIS — M5431 Sciatica, right side: Secondary | ICD-10-CM | POA: Diagnosis not present

## 2021-03-25 DIAGNOSIS — M25651 Stiffness of right hip, not elsewhere classified: Secondary | ICD-10-CM | POA: Diagnosis not present

## 2021-03-25 DIAGNOSIS — M5441 Lumbago with sciatica, right side: Secondary | ICD-10-CM | POA: Diagnosis not present

## 2021-03-25 DIAGNOSIS — M25551 Pain in right hip: Secondary | ICD-10-CM | POA: Diagnosis not present

## 2021-03-28 DIAGNOSIS — M5441 Lumbago with sciatica, right side: Secondary | ICD-10-CM | POA: Diagnosis not present

## 2021-03-28 DIAGNOSIS — M5431 Sciatica, right side: Secondary | ICD-10-CM | POA: Diagnosis not present

## 2021-03-28 DIAGNOSIS — M25651 Stiffness of right hip, not elsewhere classified: Secondary | ICD-10-CM | POA: Diagnosis not present

## 2021-03-28 DIAGNOSIS — M25551 Pain in right hip: Secondary | ICD-10-CM | POA: Diagnosis not present

## 2021-04-01 ENCOUNTER — Other Ambulatory Visit: Payer: Self-pay | Admitting: Neurology

## 2021-04-01 DIAGNOSIS — R413 Other amnesia: Secondary | ICD-10-CM

## 2021-04-02 DIAGNOSIS — M25551 Pain in right hip: Secondary | ICD-10-CM | POA: Diagnosis not present

## 2021-04-02 DIAGNOSIS — M25651 Stiffness of right hip, not elsewhere classified: Secondary | ICD-10-CM | POA: Diagnosis not present

## 2021-04-02 DIAGNOSIS — M5441 Lumbago with sciatica, right side: Secondary | ICD-10-CM | POA: Diagnosis not present

## 2021-04-02 DIAGNOSIS — M5431 Sciatica, right side: Secondary | ICD-10-CM | POA: Diagnosis not present

## 2021-04-04 DIAGNOSIS — M5431 Sciatica, right side: Secondary | ICD-10-CM | POA: Diagnosis not present

## 2021-04-04 DIAGNOSIS — M25551 Pain in right hip: Secondary | ICD-10-CM | POA: Diagnosis not present

## 2021-04-04 DIAGNOSIS — I480 Paroxysmal atrial fibrillation: Secondary | ICD-10-CM | POA: Diagnosis not present

## 2021-04-04 DIAGNOSIS — N1831 Chronic kidney disease, stage 3a: Secondary | ICD-10-CM | POA: Diagnosis not present

## 2021-04-04 DIAGNOSIS — M25651 Stiffness of right hip, not elsewhere classified: Secondary | ICD-10-CM | POA: Diagnosis not present

## 2021-04-04 DIAGNOSIS — M5441 Lumbago with sciatica, right side: Secondary | ICD-10-CM | POA: Diagnosis not present

## 2021-04-08 DIAGNOSIS — M5416 Radiculopathy, lumbar region: Secondary | ICD-10-CM | POA: Diagnosis not present

## 2021-04-08 DIAGNOSIS — D6869 Other thrombophilia: Secondary | ICD-10-CM | POA: Diagnosis not present

## 2021-04-08 DIAGNOSIS — M5136 Other intervertebral disc degeneration, lumbar region: Secondary | ICD-10-CM | POA: Diagnosis not present

## 2021-04-09 DIAGNOSIS — M25651 Stiffness of right hip, not elsewhere classified: Secondary | ICD-10-CM | POA: Diagnosis not present

## 2021-04-09 DIAGNOSIS — M5441 Lumbago with sciatica, right side: Secondary | ICD-10-CM | POA: Diagnosis not present

## 2021-04-09 DIAGNOSIS — M25551 Pain in right hip: Secondary | ICD-10-CM | POA: Diagnosis not present

## 2021-04-09 DIAGNOSIS — M5431 Sciatica, right side: Secondary | ICD-10-CM | POA: Diagnosis not present

## 2021-04-11 DIAGNOSIS — M5431 Sciatica, right side: Secondary | ICD-10-CM | POA: Diagnosis not present

## 2021-04-11 DIAGNOSIS — M25651 Stiffness of right hip, not elsewhere classified: Secondary | ICD-10-CM | POA: Diagnosis not present

## 2021-04-11 DIAGNOSIS — M5441 Lumbago with sciatica, right side: Secondary | ICD-10-CM | POA: Diagnosis not present

## 2021-04-11 DIAGNOSIS — M25551 Pain in right hip: Secondary | ICD-10-CM | POA: Diagnosis not present

## 2021-04-15 DIAGNOSIS — M5431 Sciatica, right side: Secondary | ICD-10-CM | POA: Diagnosis not present

## 2021-04-15 DIAGNOSIS — M25651 Stiffness of right hip, not elsewhere classified: Secondary | ICD-10-CM | POA: Diagnosis not present

## 2021-04-15 DIAGNOSIS — M5441 Lumbago with sciatica, right side: Secondary | ICD-10-CM | POA: Diagnosis not present

## 2021-04-15 DIAGNOSIS — M25551 Pain in right hip: Secondary | ICD-10-CM | POA: Diagnosis not present

## 2021-04-16 DIAGNOSIS — M545 Low back pain, unspecified: Secondary | ICD-10-CM | POA: Diagnosis not present

## 2021-04-16 DIAGNOSIS — M25551 Pain in right hip: Secondary | ICD-10-CM | POA: Diagnosis not present

## 2021-04-16 DIAGNOSIS — Z515 Encounter for palliative care: Secondary | ICD-10-CM | POA: Diagnosis not present

## 2021-04-22 DIAGNOSIS — Q6239 Other obstructive defects of renal pelvis and ureter: Secondary | ICD-10-CM | POA: Diagnosis not present

## 2021-04-23 DIAGNOSIS — M25651 Stiffness of right hip, not elsewhere classified: Secondary | ICD-10-CM | POA: Diagnosis not present

## 2021-04-23 DIAGNOSIS — M5441 Lumbago with sciatica, right side: Secondary | ICD-10-CM | POA: Diagnosis not present

## 2021-04-23 DIAGNOSIS — M5431 Sciatica, right side: Secondary | ICD-10-CM | POA: Diagnosis not present

## 2021-04-23 DIAGNOSIS — M25551 Pain in right hip: Secondary | ICD-10-CM | POA: Diagnosis not present

## 2021-04-26 DIAGNOSIS — Q6239 Other obstructive defects of renal pelvis and ureter: Secondary | ICD-10-CM | POA: Diagnosis not present

## 2021-04-30 DIAGNOSIS — M25551 Pain in right hip: Secondary | ICD-10-CM | POA: Diagnosis not present

## 2021-04-30 DIAGNOSIS — M25651 Stiffness of right hip, not elsewhere classified: Secondary | ICD-10-CM | POA: Diagnosis not present

## 2021-04-30 DIAGNOSIS — M5431 Sciatica, right side: Secondary | ICD-10-CM | POA: Diagnosis not present

## 2021-04-30 DIAGNOSIS — M5441 Lumbago with sciatica, right side: Secondary | ICD-10-CM | POA: Diagnosis not present

## 2021-05-02 DIAGNOSIS — I480 Paroxysmal atrial fibrillation: Secondary | ICD-10-CM | POA: Diagnosis not present

## 2021-05-02 DIAGNOSIS — F331 Major depressive disorder, recurrent, moderate: Secondary | ICD-10-CM | POA: Insufficient documentation

## 2021-05-02 DIAGNOSIS — Z7901 Long term (current) use of anticoagulants: Secondary | ICD-10-CM | POA: Diagnosis not present

## 2021-05-02 DIAGNOSIS — Z8673 Personal history of transient ischemic attack (TIA), and cerebral infarction without residual deficits: Secondary | ICD-10-CM | POA: Diagnosis not present

## 2021-05-02 DIAGNOSIS — R419 Unspecified symptoms and signs involving cognitive functions and awareness: Secondary | ICD-10-CM | POA: Diagnosis not present

## 2021-05-06 DIAGNOSIS — M25651 Stiffness of right hip, not elsewhere classified: Secondary | ICD-10-CM | POA: Diagnosis not present

## 2021-05-06 DIAGNOSIS — M5441 Lumbago with sciatica, right side: Secondary | ICD-10-CM | POA: Diagnosis not present

## 2021-05-06 DIAGNOSIS — M5431 Sciatica, right side: Secondary | ICD-10-CM | POA: Diagnosis not present

## 2021-05-06 DIAGNOSIS — M25551 Pain in right hip: Secondary | ICD-10-CM | POA: Diagnosis not present

## 2021-05-07 ENCOUNTER — Ambulatory Visit: Payer: PPO | Admitting: Neurology

## 2021-05-07 ENCOUNTER — Other Ambulatory Visit: Payer: Self-pay

## 2021-05-07 ENCOUNTER — Encounter: Payer: Self-pay | Admitting: Neurology

## 2021-05-07 DIAGNOSIS — Z87898 Personal history of other specified conditions: Secondary | ICD-10-CM | POA: Diagnosis not present

## 2021-05-07 DIAGNOSIS — R413 Other amnesia: Secondary | ICD-10-CM

## 2021-05-07 NOTE — Progress Notes (Signed)
PATIENT: [name] DOB: April 21, 1936  REASON FOR VISIT: Follow up HISTORY FROM: Patient PRIMARY NEUROLOGIST: Dr. April Manson (Dr. Jannifer Franklin has now retired)  HISTORY OF PRESENT ILLNESS: Today 05/07/21 [name] is an 85 year old female here today for follow-up with history of seizures, stroke, and memory trouble.  RPR, B12, sed rate was unremarkable in May 2022.  EEG was normal June 2022.  MRI of the brain showed right frontal encephalomalacia, evidence of small vessel disease that appears fairly chronic, minimal progression from MRI of the brain in 2013.  Started on [place], tolerating well. Thinks helping, remember things faster.  Lives alone, drives a car in town.  Manages her medications, finances.  Has chronic pain issues, right hip pain today. Seeing Dr. Alvan Dame for consultation right hip surgery.  On Coumadin for history of A. fib.  Is not on seizure medication, has been many years since last seizure event.  Has no new issues to discuss today.  Is here today with her friend Maudie Mercury. MMSE 27/30 today.   HISTORY 10/16/2020 Dr. Jannifer Franklin: Ms. Bouie is an 85 year old right-handed white female with a history of head trauma at age 76 with subsequent seizures.  The patient indicates that her seizures are usually associated with sudden episodes of confusion and anxiety.  The episodes continue to occur with some regularity, and at times may occur in clusters.  The patient however is not on any medications for seizures.  The patient does not recall exactly when she was taken off of medications for her seizure disorder.  She reports a history of stroke events, the last one occurred about 7 years ago, the patient has had some difficulty with memory since that time that has gradually worsened as well.  She currently lives alone, she does not like to drive longer distances because she has difficulty getting lost.  She is doing her finances but sometimes she will pay the bills twice.  She keeps up with  her medications but occasionally she will miss medication.  She keeps up with her own appointments using calendars.  She has a lot of chronic pain issues, she claims that she has had headaches throughout her life since her head trauma.  The headaches tend to be in the frontotemporal area and behind the right eye.  It is not clear from her that the headaches have changed significantly over time.  The patient is unable to tell me exactly how often the headaches occur.  She seems to be a poor historian.  The patient is having a lot of back pain and right leg discomfort and right hip pain.  She has seen Dr. Nelva Bush previously for this.  She has a history of a prior T12 compression fracture.  The patient reports no numbness of extremities, she does have some leg swelling.  She denies any blackout episodes or difficulty controlling the bowels or the bladder.  She uses a cane to get around, she has not had any recent falls.  She does have a chronic gait instability.  She has decreased visual acuity in the right eye and she has a prism in her glasses.  REVIEW OF SYSTEMS: Out of a complete 14 system review of symptoms, the patient complains only of the following symptoms, and all other reviewed systems are negative.  See HPI  ALLERGIES: Allergies  Allergen Reactions   Epinephrine Anaphylaxis   Hydrocodone-Acetaminophen    Demerol [Meperidine] Other (See Comments)    headaches   Vicodin [Hydrocodone-Acetaminophen] Other (See Comments)  [name] it makes her crazy. Pt tolerates acetaminophen    HOME MEDICATIONS: Outpatient Medications Prior to Visit  Medication Sig Dispense Refill   albuterol (PROVENTIL) (2.5 MG/3ML) 0.083% nebulizer solution Take 3 mLs (2.5 mg total) by nebulization every 6 (six) hours as needed for wheezing or shortness of breath. 75 mL 0   albuterol (VENTOLIN HFA) 108 (90 Base) MCG/ACT inhaler Inhale 1-2 puffs into the lungs every 6 (six) hours as needed for wheezing or shortness of breath.      amLODipine (NORVASC) 5 MG tablet Take 1 tablet (5 mg total) by mouth daily. 30 tablet 0   Ascorbic Acid (VITAMIN C) 1000 MG tablet Take 1,000 mg by mouth daily.     B Complex Vitamins (VITAMIN B COMPLEX) TABS Take 1 tablet by mouth daily.     Biotin 300 MCG TABS Take 300 mcg by mouth daily after supper.      Calcium Carb-Cholecalciferol (CALCIUM 600 + D PO) Take 1 tablet by mouth in the morning and at bedtime.     conjugated estrogens (PREMARIN) vaginal cream Apply 1 application topically daily as needed.     denosumab (PROLIA) 60 MG/ML SOSY injection Inject 60 mg into the skin every 6 (six) months.     diclofenac Sodium (VOLTAREN) 1 % GEL Apply 2 g topically 4 (four) times daily.     ezetimibe (ZETIA) 10 MG tablet Take 1 tablet (10 mg total) by mouth daily. 30 tablet 0   Ferrous Gluconate (IRON) 240 (27 Fe) MG TABS Take 1 tablet by mouth daily. 30 tablet 0   Flaxseed, Linseed, (FLAXSEED OIL) 1000 MG CAPS Take 1,000 mg by mouth daily.      furosemide (LASIX) 40 MG tablet Take 40 mg by mouth as directed. Takes 40 mg am, 20 mg pm     Lecithin 1200 MG CAPS Take 1,200 mg by mouth daily.     levothyroxine (SYNTHROID, LEVOTHROID) 50 MCG tablet Take 1 tablet (50 mcg total) by mouth daily. 30 tablet 0   lidocaine (LIDODERM) 5 % 2 patches daily.     Lutein 40 MG CAPS Take 40 mg by mouth daily.      Magnesium 250 MG TABS Take 250 mg by mouth daily.      memantine (NAMENDA) 5 MG tablet TAKE 2 TABLETS BY MOUTH TWICE DAILY 120 tablet 2   metoprolol tartrate (LOPRESSOR) 25 MG tablet Take 1 tablet (25 mg total) by mouth 2 (two) times daily. (MAY TAKE AN ADDITIONAL 25MG  AS NEEDED FOR PALPITATIONS) 90 tablet 6   Omega-3 Fatty Acids (FISH OIL) 1200 MG CAPS Take 1,200 mg by mouth daily.     pantoprazole (PROTONIX) 40 MG tablet Take 1 tablet (40 mg total) by mouth daily.     potassium chloride (K-DUR) 10 MEQ tablet Take 1 tablet (10 mEq total) by mouth 2 (two) times daily. 60 tablet 0   Turmeric 500 MG TABS  Take 500 mg by mouth daily.      vitamin E 180 MG (400 UNITS) capsule Take 400 Units by mouth daily.     warfarin (COUMADIN) 4 MG tablet Take 1 tablet (4 mg total) by mouth daily. 30 tablet 0   Zinc 50 MG TABS Take 50 mg by mouth daily.      Vibegron (GEMTESA) 75 MG TABS Take 75 mg by mouth daily. (Patient not taking: Reported on 02/21/2021) 28 tablet 0   No facility-administered medications prior to visit.    PAST MEDICAL HISTORY: Past Medical History:  Diagnosis Date   Adenomatous colon polyp 2008   Anxiety    Asthma    has not needed in over a year   Complication of anesthesia    pt reports TIA and sezures after surgery   Coronary artery disease    CVA (cerebral vascular accident) ([place])    x 3   Degenerative disc disease, lumbar    Depression    Diverticulosis    GERD (gastroesophageal reflux disease)    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48   age 52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 2 weeks   Hemorrhoids 2007   History of kidney stones    HTN (hypertension)    Hyperplastic colon polyp 2005   Hypothyroidism    Narcolepsy    PONV (postoperative nausea and vomiting)    TIA (transient ischemic attack)     PAST SURGICAL HISTORY: Past Surgical History:  Procedure Laterality Date   ABDOMINAL HYSTERECTOMY     age 89, complicated by poor wound healing, followed by revision of scar and radiation for ?malignancy   BACK SURGERY  07/2018   lost 2 inches   BREAST ENHANCEMENT SURGERY     BREAST IMPLANT EXCHANGE Right 06/23/2016   Procedure: RIGHT BREAST IMPLANT REMOVAL AND REPLACEMENT;  Surgeon: Cristine Polio, MD;  Location: [facility];  Service: Plastics;  Laterality: Right;   COLONOSCOPY  2005   2 hyperplastic polyps   COLONOSCOPY  2007   Dr. Oneida Alar- hemorrhoids   COLONOSCOPY  2008   Dr. Reed Pandy polyp- rare sigmoid diverticulosis, internal hemorrhoids   COLONOSCOPY  12/01/2011   Procedure: COLONOSCOPY;  Surgeon: Danie Binder, MD;  [facility];  Service: Endoscopy;   Laterality: N/A;  12:30 PM   CYSTOSCOPY/URETEROSCOPY/HOLMIUM LASER/STENT PLACEMENT Left 02/29/2020   Procedure: CYSTOSCOPY/URETEROSCOPY/HOLMIUM LASER/STENT PLACEMENT;  Surgeon: Ceasar Mons, MD;  Location: [facility];  Service: Urology;  Laterality: Left;   epidural steroid injection     She is getting injections with Dr. Nelva Bush q3wks.   ESOPHAGOGASTRODUODENOSCOPY (EGD) WITH ESOPHAGEAL DILATION N/A 04/15/2013   Procedure: ESOPHAGOGASTRODUODENOSCOPY (EGD) WITH ESOPHAGEAL DILATION;  Surgeon: Danie Binder, MD;  [facility];  Service: Endoscopy;  Laterality: N/A;  11:45-moved to 12:30 Darius Bump to notify pt   EYE SURGERY  2010   EYE SURGERY     IR KYPHO LUMBAR INC FX REDUCE BONE BX UNI/BIL CANNULATION INC/IMAGING  08/23/2018   IR RADIOLOGIST EVAL & MGMT  12/23/2018   right thumb surgery      FAMILY HISTORY: Family History  Problem Relation Age of Onset   Colon cancer Father        age 81   Prostate cancer Father    Pancreatic cancer Mother        age 10    SOCIAL HISTORY: Social History   Socioeconomic History   Marital status: Widowed    Spouse name: Not on file   Number of children: Not on file   Years of education: Not on file   Highest education level: Not on file  Occupational History   Not on file  Tobacco Use   Smoking status: Never   Smokeless tobacco: Never  Vaping Use   Vaping Use: Never used  Substance and Sexual Activity   Alcohol use: No   Drug use: No   Sexual activity: Never  Other Topics Concern   Not on file  Social History Narrative   Lives alone   Right Handed    Drinks no caffeine daily  Social Determinants of Health   Financial Resource Strain: Not on file  Food Insecurity: Not on file  Transportation Needs: Not on file  Physical Activity: Not on file  Stress: Not on file  Social Connections: Not on file  Intimate Partner Violence: Not on file   PHYSICAL EXAM  Vitals:   05/07/21 1059  BP: (!) 158/87  Pulse: (!) 57   Weight: 146 lb (66.2 kg)  Height: 5' (1.524 m)   Body mass index is 28.51 kg/m.  Generalized: Well developed, in no acute distress  MMSE - Mini Mental State Exam 05/07/2021 10/16/2020  Orientation to time 4 5  Orientation to Place 4 5  Registration 3 3  Attention/ Calculation 4 5  Recall 3 3  Language- name 2 objects 2 2  Language- repeat 1 1  Language- follow 3 step command 3 3  Language- read & follow direction 1 1  Write a sentence 1 1  Copy design 1 1  Total score 27 30    Neurological examination  Mentation: Alert oriented to time, place, history taking. Follows all commands speech and language fluent Cranial nerve II-XII: Pupils were equal round reactive to light. Extraocular movements were full, visual field were full on confrontational test, right eye deviates to the right with primary gaze. Facial sensation and strength were normal. Head turning and shoulder shrug were normal and symmetric. Motor: The motor testing reveals 5 over 5 strength of all 4 extremities. Good symmetric motor tone is noted throughout.  Sensory: Sensory testing is intact to soft touch on all 4 extremities. No evidence of extinction is noted.  Coordination: Cerebellar testing reveals good finger-nose-finger and heel-to-shin bilaterally.  Gait and station: Has to push off from seated position to stand, gait is slightly wide-based, can walk short distances independently, otherwise uses a cane Reflexes: Deep tendon reflexes are symmetric and normal bilaterally.   DIAGNOSTIC DATA (LABS, IMAGING, TESTING) - I reviewed patient records, labs, notes, testing and imaging myself where available.  Lab Results  Component Value Date   WBC 6.9 01/29/2021   HGB 14.7 01/29/2021   HCT 45.3 01/29/2021   MCV 90.4 01/29/2021   PLT 254 01/29/2021      Component Value Date/Time   NA 143 01/29/2021 0910   K 4.0 01/29/2021 0910   CL 104 01/29/2021 0910   CO2 31 01/29/2021 0910   GLUCOSE 101 (H) 01/29/2021 0910    BUN 25 (H) 01/29/2021 0910   CREATININE 1.07 (H) 01/29/2021 0910   CREATININE 1.00 (H) 01/11/2015 0954   CALCIUM 9.8 01/29/2021 0910   PROT 6.9 01/29/2021 0910   ALBUMIN 4.1 01/29/2021 0910   AST 26 01/29/2021 0910   ALT 21 01/29/2021 0910   ALKPHOS 58 01/29/2021 0910   BILITOT 0.6 01/29/2021 0910   GFRNONAA 52 (L) 01/29/2021 0910   GFRAA 52 (L) 02/27/2020 1107   Lab Results  Component Value Date   CHOL 220 (H) 12/17/2018   HDL 60 12/17/2018   LDLCALC 131 (H) 12/17/2018   TRIG 146 12/17/2018   CHOLHDL 3.7 12/17/2018   Lab Results  Component Value Date   HGBA1C  01/04/2009    5.6 (NOTE) The ADA recommends the following therapeutic goal for glycemic control related to Hgb A1c measurement: Goal of therapy: <6.5 Hgb A1c  Reference: American Diabetes Association: Clinical Practice Recommendations 2010, Diabetes Care, 2010, 33: (Suppl  1).   Lab Results  Component Value Date   ZJIRCVEL38 1,895 (H) 10/16/2020   Lab Results  Component Value Date   TSH 3.716 01/29/2021      ASSESSMENT AND PLAN 85 y.o. year old female  has a past medical history of Adenomatous colon polyp (2008), Anxiety, Asthma, Complication of anesthesia, Coronary artery disease, CVA (cerebral vascular accident) ([place]), Degenerative disc disease, lumbar, Depression, Diverticulosis, GERD (gastroesophageal reflux disease), 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(1949), Hemorrhoids (2007), History of kidney stones, HTN (hypertension), Hyperplastic colon polyp (2005), Hypothyroidism, Narcolepsy, PONV (postoperative nausea and vomiting), and TIA (transient ischemic attack). here with:  1.  History of seizures, not on medication 2.  History of cerebrovascular disease 3.  Memory disturbance  -Doing overall well today, other than chronic pain issue -EEG was normal June 2022 -B12, RPR, sed rate was unremarkable -MRI of the brain showed right frontal encephalomalacia, small vessel disease that appears chronic, minimal progression from  MRI of the brain in 2013 -Continue Namenda, 10 mg twice daily, will hold Aricept given seizure history -MMSE 27/30, was 30/30 last visit, feels memory better with Namenda -Follow-up in 6 months or sooner if needed, Dr. April Manson will be primary neurologist since Dr. Jannifer Franklin has retired   Butler Denmark, AGNP-C, [place] 05/07/2021, 11:08 AM [facility] [address], [place], [place] [address] [phone]

## 2021-05-07 NOTE — Patient Instructions (Addendum)
Great to see you today! Continue current medications  See you back in 6 months

## 2021-05-08 ENCOUNTER — Other Ambulatory Visit (HOSPITAL_COMMUNITY): Payer: Self-pay | Admitting: Family Medicine

## 2021-05-08 DIAGNOSIS — M25532 Pain in left wrist: Secondary | ICD-10-CM | POA: Diagnosis not present

## 2021-05-08 DIAGNOSIS — M25559 Pain in unspecified hip: Secondary | ICD-10-CM | POA: Diagnosis not present

## 2021-05-08 DIAGNOSIS — M25551 Pain in right hip: Secondary | ICD-10-CM

## 2021-05-10 ENCOUNTER — Other Ambulatory Visit (HOSPITAL_COMMUNITY): Payer: Self-pay | Admitting: Family Medicine

## 2021-05-10 ENCOUNTER — Other Ambulatory Visit: Payer: Self-pay

## 2021-05-10 ENCOUNTER — Ambulatory Visit (HOSPITAL_COMMUNITY)
Admission: RE | Admit: 2021-05-10 | Discharge: 2021-05-10 | Disposition: A | Discharge status: Home / Self Care | Payer: PPO | Source: Ambulatory Visit | Attending: Family Medicine | Admitting: Family Medicine

## 2021-05-10 DIAGNOSIS — M25551 Pain in right hip: Secondary | ICD-10-CM | POA: Insufficient documentation

## 2021-05-10 DIAGNOSIS — M25552 Pain in left hip: Secondary | ICD-10-CM | POA: Diagnosis not present

## 2021-05-10 DIAGNOSIS — M25532 Pain in left wrist: Secondary | ICD-10-CM | POA: Diagnosis not present

## 2021-05-14 DIAGNOSIS — M5431 Sciatica, right side: Secondary | ICD-10-CM | POA: Diagnosis not present

## 2021-05-14 DIAGNOSIS — M25651 Stiffness of right hip, not elsewhere classified: Secondary | ICD-10-CM | POA: Diagnosis not present

## 2021-05-14 DIAGNOSIS — M25551 Pain in right hip: Secondary | ICD-10-CM | POA: Diagnosis not present

## 2021-05-14 DIAGNOSIS — M5441 Lumbago with sciatica, right side: Secondary | ICD-10-CM | POA: Diagnosis not present

## 2021-05-16 DIAGNOSIS — M7061 Trochanteric bursitis, right hip: Secondary | ICD-10-CM | POA: Diagnosis not present

## 2021-05-16 DIAGNOSIS — M25551 Pain in right hip: Secondary | ICD-10-CM | POA: Diagnosis not present

## 2021-05-16 DIAGNOSIS — M1611 Unilateral primary osteoarthritis, right hip: Secondary | ICD-10-CM | POA: Diagnosis not present

## 2021-05-23 DIAGNOSIS — M5441 Lumbago with sciatica, right side: Secondary | ICD-10-CM | POA: Diagnosis not present

## 2021-05-23 DIAGNOSIS — M25651 Stiffness of right hip, not elsewhere classified: Secondary | ICD-10-CM | POA: Diagnosis not present

## 2021-05-23 DIAGNOSIS — M25551 Pain in right hip: Secondary | ICD-10-CM | POA: Diagnosis not present

## 2021-05-23 DIAGNOSIS — M5431 Sciatica, right side: Secondary | ICD-10-CM | POA: Diagnosis not present

## 2021-05-26 DIAGNOSIS — M7061 Trochanteric bursitis, right hip: Secondary | ICD-10-CM | POA: Insufficient documentation

## 2021-05-28 DIAGNOSIS — I480 Paroxysmal atrial fibrillation: Secondary | ICD-10-CM | POA: Diagnosis not present

## 2021-05-29 DIAGNOSIS — M25551 Pain in right hip: Secondary | ICD-10-CM | POA: Diagnosis not present

## 2021-05-30 DIAGNOSIS — M25551 Pain in right hip: Secondary | ICD-10-CM | POA: Diagnosis not present

## 2021-05-30 DIAGNOSIS — M25651 Stiffness of right hip, not elsewhere classified: Secondary | ICD-10-CM | POA: Diagnosis not present

## 2021-05-30 DIAGNOSIS — M5441 Lumbago with sciatica, right side: Secondary | ICD-10-CM | POA: Diagnosis not present

## 2021-05-30 DIAGNOSIS — M5431 Sciatica, right side: Secondary | ICD-10-CM | POA: Diagnosis not present

## 2021-06-03 DIAGNOSIS — I480 Paroxysmal atrial fibrillation: Secondary | ICD-10-CM | POA: Diagnosis not present

## 2021-06-03 DIAGNOSIS — M25551 Pain in right hip: Secondary | ICD-10-CM | POA: Diagnosis not present

## 2021-06-03 DIAGNOSIS — E782 Mixed hyperlipidemia: Secondary | ICD-10-CM | POA: Diagnosis not present

## 2021-06-03 DIAGNOSIS — R600 Localized edema: Secondary | ICD-10-CM | POA: Diagnosis not present

## 2021-06-03 DIAGNOSIS — M79604 Pain in right leg: Secondary | ICD-10-CM | POA: Diagnosis not present

## 2021-06-03 DIAGNOSIS — E039 Hypothyroidism, unspecified: Secondary | ICD-10-CM | POA: Diagnosis not present

## 2021-06-03 DIAGNOSIS — J4540 Moderate persistent asthma, uncomplicated: Secondary | ICD-10-CM | POA: Diagnosis not present

## 2021-06-03 DIAGNOSIS — I5032 Chronic diastolic (congestive) heart failure: Secondary | ICD-10-CM | POA: Diagnosis not present

## 2021-06-03 DIAGNOSIS — R109 Unspecified abdominal pain: Secondary | ICD-10-CM | POA: Diagnosis not present

## 2021-06-03 DIAGNOSIS — M79671 Pain in right foot: Secondary | ICD-10-CM | POA: Insufficient documentation

## 2021-06-03 DIAGNOSIS — N1831 Chronic kidney disease, stage 3a: Secondary | ICD-10-CM | POA: Diagnosis not present

## 2021-06-03 DIAGNOSIS — Z7901 Long term (current) use of anticoagulants: Secondary | ICD-10-CM | POA: Diagnosis not present

## 2021-06-04 DIAGNOSIS — Z515 Encounter for palliative care: Secondary | ICD-10-CM | POA: Diagnosis not present

## 2021-06-04 DIAGNOSIS — R569 Unspecified convulsions: Secondary | ICD-10-CM | POA: Diagnosis not present

## 2021-06-04 DIAGNOSIS — D6869 Other thrombophilia: Secondary | ICD-10-CM | POA: Diagnosis not present

## 2021-06-04 DIAGNOSIS — I5032 Chronic diastolic (congestive) heart failure: Secondary | ICD-10-CM | POA: Diagnosis not present

## 2021-06-04 DIAGNOSIS — Z66 Do not resuscitate: Secondary | ICD-10-CM | POA: Diagnosis not present

## 2021-06-04 DIAGNOSIS — D692 Other nonthrombocytopenic purpura: Secondary | ICD-10-CM | POA: Diagnosis not present

## 2021-06-04 DIAGNOSIS — I480 Paroxysmal atrial fibrillation: Secondary | ICD-10-CM | POA: Diagnosis not present

## 2021-06-04 DIAGNOSIS — I70203 Unspecified atherosclerosis of native arteries of extremities, bilateral legs: Secondary | ICD-10-CM | POA: Diagnosis not present

## 2021-06-04 DIAGNOSIS — Z6827 Body mass index (BMI) 27.0-27.9, adult: Secondary | ICD-10-CM | POA: Diagnosis not present

## 2021-06-04 DIAGNOSIS — Z7901 Long term (current) use of anticoagulants: Secondary | ICD-10-CM | POA: Diagnosis not present

## 2021-06-04 DIAGNOSIS — N183 Chronic kidney disease, stage 3 unspecified: Secondary | ICD-10-CM | POA: Diagnosis not present

## 2021-06-04 DIAGNOSIS — F339 Major depressive disorder, recurrent, unspecified: Secondary | ICD-10-CM | POA: Diagnosis not present

## 2021-06-14 DIAGNOSIS — M5416 Radiculopathy, lumbar region: Secondary | ICD-10-CM | POA: Diagnosis not present

## 2021-06-14 DIAGNOSIS — M5136 Other intervertebral disc degeneration, lumbar region: Secondary | ICD-10-CM | POA: Diagnosis not present

## 2021-06-14 DIAGNOSIS — D6869 Other thrombophilia: Secondary | ICD-10-CM | POA: Diagnosis not present

## 2021-06-25 ENCOUNTER — Other Ambulatory Visit: Payer: Self-pay | Admitting: Neurology

## 2021-06-25 DIAGNOSIS — R413 Other amnesia: Secondary | ICD-10-CM

## 2021-07-01 DIAGNOSIS — M545 Low back pain, unspecified: Secondary | ICD-10-CM | POA: Diagnosis not present

## 2021-07-01 DIAGNOSIS — I480 Paroxysmal atrial fibrillation: Secondary | ICD-10-CM | POA: Diagnosis not present

## 2021-07-01 DIAGNOSIS — M25551 Pain in right hip: Secondary | ICD-10-CM | POA: Diagnosis not present

## 2021-07-01 DIAGNOSIS — N1831 Chronic kidney disease, stage 3a: Secondary | ICD-10-CM | POA: Diagnosis not present

## 2021-07-01 DIAGNOSIS — E039 Hypothyroidism, unspecified: Secondary | ICD-10-CM | POA: Diagnosis not present

## 2021-07-01 DIAGNOSIS — Z7901 Long term (current) use of anticoagulants: Secondary | ICD-10-CM | POA: Diagnosis not present

## 2021-07-09 ENCOUNTER — Encounter (HOSPITAL_COMMUNITY)
Admission: RE | Admit: 2021-07-09 | Discharge: 2021-07-09 | Disposition: A | Discharge status: Home / Self Care | Payer: PPO | Source: Ambulatory Visit | Attending: Internal Medicine | Admitting: Internal Medicine

## 2021-07-09 DIAGNOSIS — M810 Age-related osteoporosis without current pathological fracture: Secondary | ICD-10-CM | POA: Insufficient documentation

## 2021-07-09 MED ORDER — DENOSUMAB 60 MG/ML ~~LOC~~ SOSY
PREFILLED_SYRINGE | SUBCUTANEOUS | Status: AC
  Filled 2021-07-09: qty 1

## 2021-07-09 MED ORDER — DENOSUMAB 60 MG/ML ~~LOC~~ SOSY
60.0000 mg | PREFILLED_SYRINGE | Freq: Once | SUBCUTANEOUS | Status: AC
  Administered 2021-07-09: 60 mg via SUBCUTANEOUS

## 2021-07-18 ENCOUNTER — Other Ambulatory Visit: Payer: Self-pay | Admitting: Neurological Surgery

## 2021-07-18 ENCOUNTER — Other Ambulatory Visit (HOSPITAL_COMMUNITY): Payer: Self-pay | Admitting: Neurological Surgery

## 2021-07-18 DIAGNOSIS — M5416 Radiculopathy, lumbar region: Secondary | ICD-10-CM

## 2021-07-22 DIAGNOSIS — R600 Localized edema: Secondary | ICD-10-CM | POA: Diagnosis not present

## 2021-07-22 DIAGNOSIS — Z7901 Long term (current) use of anticoagulants: Secondary | ICD-10-CM | POA: Diagnosis not present

## 2021-07-22 DIAGNOSIS — M545 Low back pain, unspecified: Secondary | ICD-10-CM | POA: Diagnosis not present

## 2021-07-22 DIAGNOSIS — M25551 Pain in right hip: Secondary | ICD-10-CM | POA: Diagnosis not present

## 2021-07-22 DIAGNOSIS — R140 Abdominal distension (gaseous): Secondary | ICD-10-CM | POA: Diagnosis not present

## 2021-07-22 DIAGNOSIS — G4762 Sleep related leg cramps: Secondary | ICD-10-CM | POA: Diagnosis not present

## 2021-07-22 DIAGNOSIS — E039 Hypothyroidism, unspecified: Secondary | ICD-10-CM | POA: Diagnosis not present

## 2021-07-22 DIAGNOSIS — I480 Paroxysmal atrial fibrillation: Secondary | ICD-10-CM | POA: Diagnosis not present

## 2021-07-22 DIAGNOSIS — L723 Sebaceous cyst: Secondary | ICD-10-CM | POA: Diagnosis not present

## 2021-07-22 DIAGNOSIS — N1831 Chronic kidney disease, stage 3a: Secondary | ICD-10-CM | POA: Diagnosis not present

## 2021-07-23 DIAGNOSIS — Z6826 Body mass index (BMI) 26.0-26.9, adult: Secondary | ICD-10-CM | POA: Diagnosis not present

## 2021-07-23 DIAGNOSIS — M545 Low back pain, unspecified: Secondary | ICD-10-CM | POA: Diagnosis not present

## 2021-07-23 DIAGNOSIS — E782 Mixed hyperlipidemia: Secondary | ICD-10-CM | POA: Diagnosis not present

## 2021-07-23 DIAGNOSIS — Z66 Do not resuscitate: Secondary | ICD-10-CM | POA: Diagnosis not present

## 2021-07-23 DIAGNOSIS — Z7951 Long term (current) use of inhaled steroids: Secondary | ICD-10-CM | POA: Diagnosis not present

## 2021-07-23 DIAGNOSIS — I5032 Chronic diastolic (congestive) heart failure: Secondary | ICD-10-CM | POA: Diagnosis not present

## 2021-07-23 DIAGNOSIS — J45909 Unspecified asthma, uncomplicated: Secondary | ICD-10-CM | POA: Diagnosis not present

## 2021-07-23 DIAGNOSIS — M25551 Pain in right hip: Secondary | ICD-10-CM | POA: Diagnosis not present

## 2021-07-23 DIAGNOSIS — D692 Other nonthrombocytopenic purpura: Secondary | ICD-10-CM | POA: Diagnosis not present

## 2021-07-23 DIAGNOSIS — Z7901 Long term (current) use of anticoagulants: Secondary | ICD-10-CM | POA: Diagnosis not present

## 2021-07-23 DIAGNOSIS — Z515 Encounter for palliative care: Secondary | ICD-10-CM | POA: Diagnosis not present

## 2021-07-23 DIAGNOSIS — I10 Essential (primary) hypertension: Secondary | ICD-10-CM | POA: Diagnosis not present

## 2021-07-23 DIAGNOSIS — G8929 Other chronic pain: Secondary | ICD-10-CM | POA: Diagnosis not present

## 2021-07-23 DIAGNOSIS — I70203 Unspecified atherosclerosis of native arteries of extremities, bilateral legs: Secondary | ICD-10-CM | POA: Diagnosis not present

## 2021-08-02 DIAGNOSIS — M25551 Pain in right hip: Secondary | ICD-10-CM | POA: Diagnosis not present

## 2021-08-05 ENCOUNTER — Ambulatory Visit (HOSPITAL_COMMUNITY)
Admission: RE | Admit: 2021-08-05 | Discharge: 2021-08-05 | Disposition: A | Discharge status: Home / Self Care | Payer: PPO | Source: Ambulatory Visit | Attending: Neurological Surgery | Admitting: Neurological Surgery

## 2021-08-05 ENCOUNTER — Other Ambulatory Visit: Payer: Self-pay

## 2021-08-05 DIAGNOSIS — M5416 Radiculopathy, lumbar region: Secondary | ICD-10-CM | POA: Diagnosis not present

## 2021-08-05 DIAGNOSIS — M5116 Intervertebral disc disorders with radiculopathy, lumbar region: Secondary | ICD-10-CM | POA: Diagnosis not present

## 2021-08-07 ENCOUNTER — Emergency Department (HOSPITAL_COMMUNITY)
Admission: EM | Admit: 2021-08-07 | Discharge: 2021-08-07 | Disposition: A | Discharge status: Home / Self Care | Payer: PPO | Attending: Emergency Medicine | Admitting: Emergency Medicine

## 2021-08-07 ENCOUNTER — Emergency Department (HOSPITAL_COMMUNITY): Payer: PPO

## 2021-08-07 ENCOUNTER — Encounter (HOSPITAL_COMMUNITY): Payer: Self-pay | Admitting: *Deleted

## 2021-08-07 ENCOUNTER — Other Ambulatory Visit: Payer: Self-pay

## 2021-08-07 DIAGNOSIS — I517 Cardiomegaly: Secondary | ICD-10-CM | POA: Diagnosis not present

## 2021-08-07 DIAGNOSIS — Z79899 Other long term (current) drug therapy: Secondary | ICD-10-CM | POA: Insufficient documentation

## 2021-08-07 DIAGNOSIS — I2510 Atherosclerotic heart disease of native coronary artery without angina pectoris: Secondary | ICD-10-CM | POA: Diagnosis not present

## 2021-08-07 DIAGNOSIS — I10 Essential (primary) hypertension: Secondary | ICD-10-CM | POA: Diagnosis not present

## 2021-08-07 DIAGNOSIS — I4891 Unspecified atrial fibrillation: Secondary | ICD-10-CM | POA: Diagnosis not present

## 2021-08-07 DIAGNOSIS — R748 Abnormal levels of other serum enzymes: Secondary | ICD-10-CM | POA: Diagnosis not present

## 2021-08-07 DIAGNOSIS — R079 Chest pain, unspecified: Secondary | ICD-10-CM | POA: Diagnosis not present

## 2021-08-07 DIAGNOSIS — Z7901 Long term (current) use of anticoagulants: Secondary | ICD-10-CM | POA: Insufficient documentation

## 2021-08-07 DIAGNOSIS — R600 Localized edema: Secondary | ICD-10-CM | POA: Insufficient documentation

## 2021-08-07 DIAGNOSIS — R739 Hyperglycemia, unspecified: Secondary | ICD-10-CM | POA: Diagnosis not present

## 2021-08-07 DIAGNOSIS — R000 Tachycardia, unspecified: Secondary | ICD-10-CM | POA: Diagnosis not present

## 2021-08-07 LAB — BASIC METABOLIC PANEL
Anion gap: 13 (ref 5–15)
BUN: 36 mg/dL — ABNORMAL HIGH (ref 8–23)
CO2: 24 mmol/L (ref 22–32)
Calcium: 8.9 mg/dL (ref 8.9–10.3)
Chloride: 105 mmol/L (ref 98–111)
Creatinine, Ser: 1.08 mg/dL — ABNORMAL HIGH (ref 0.44–1.00)
GFR, Estimated: 51 mL/min — ABNORMAL LOW (ref >60)
Glucose, Bld: 142 mg/dL — ABNORMAL HIGH (ref 70–99)
Potassium: 3.4 mmol/L — ABNORMAL LOW (ref 3.5–5.1)
Sodium: 142 mmol/L (ref 135–145)

## 2021-08-07 LAB — CBC
HCT: 42.8 % (ref 36.0–46.0)
Hemoglobin: 13.7 g/dL (ref 12.0–15.0)
MCH: 29.7 pg (ref 26.0–34.0)
MCHC: 32 g/dL (ref 30.0–36.0)
MCV: 92.6 fL (ref 80.0–100.0)
Platelets: 272 10*3/uL (ref 150–400)
RBC: 4.62 MIL/uL (ref 3.87–5.11)
RDW: 15.1 % (ref 11.5–15.5)
WBC: 9.3 10*3/uL (ref 4.0–10.5)
nRBC: 0 % (ref 0.0–0.2)

## 2021-08-07 LAB — PROTIME-INR
INR: 1.8 — ABNORMAL HIGH (ref 0.8–1.2)
Prothrombin Time: 21.2 seconds — ABNORMAL HIGH (ref 11.4–15.2)

## 2021-08-07 LAB — CBG MONITORING, ED: Glucose-Capillary: 135 mg/dL — ABNORMAL HIGH (ref 70–99)

## 2021-08-07 LAB — TROPONIN I (HIGH SENSITIVITY)
Troponin I (High Sensitivity): 18 ng/L — ABNORMAL HIGH (ref <18)
Troponin I (High Sensitivity): 47 ng/L — ABNORMAL HIGH (ref <18)
Troponin I (High Sensitivity): 160 ng/L (ref <18)

## 2021-08-07 MED ORDER — DILTIAZEM LOAD VIA INFUSION
15.0000 mg | Freq: Once | INTRAVENOUS | Status: AC
  Administered 2021-08-07: 15 mg via INTRAVENOUS
  Filled 2021-08-07: qty 15

## 2021-08-07 MED ORDER — DILTIAZEM HCL-DEXTROSE 125-5 MG/125ML-% IV SOLN (PREMIX)
5.0000 mg/h | INTRAVENOUS | Status: DC
  Administered 2021-08-07: 5 mg/h via INTRAVENOUS
  Filled 2021-08-07: qty 125

## 2021-08-07 MED ORDER — POTASSIUM CHLORIDE CRYS ER 20 MEQ PO TBCR
40.0000 meq | EXTENDED_RELEASE_TABLET | Freq: Once | ORAL | Status: AC
  Administered 2021-08-07: 40 meq via ORAL
  Filled 2021-08-07: qty 2

## 2021-08-07 MED ORDER — ASPIRIN 81 MG PO CHEW
324.0000 mg | CHEWABLE_TABLET | Freq: Once | ORAL | Status: AC
  Administered 2021-08-07: 324 mg via ORAL
  Filled 2021-08-07: qty 4

## 2021-08-07 NOTE — ED Notes (Signed)
Pt's friend called to come pick pt up for discharge. Friend to be here at 3. ?

## 2021-08-07 NOTE — ED Notes (Signed)
Lab reports troponin is still in process, having machine trouble ?

## 2021-08-07 NOTE — ED Provider Notes (Signed)
?[facility] ?Provider Note ? ? ?CSN: [id number] ?Arrival date & time: 08/07/21  8563 ? ?  ? ?History ? ?Chief Complaint  ?Patient presents with  ? Chest Pain  ? ? ?[name] is a 86 y.o. female. ? ? ?Chest Pain ?Associated symptoms: no fever   ?Patient has a history of coronary artery disease, stroke, hypertension and TIA who presents to the ED with complaints of chest pain.  Patient states she started having symptoms around 4 AM.  She has been feeling pressure in her chest.  It goes across to both sides.  She is feeling short of breath.  She also felt that her heart was racing.  Patient called EMS this morning and was noted to be in A-fib with a rapid rate.  Heart rates as high as 180.  Patient was given 15 mg of diltiazem.  Heart rate decreased.  Patient states she is feeling better after the EMS treatment.  She denies any fevers or chills.  No vomiting or diarrhea. ? ?She has noticed some leg swelling over the last several weeks.  Previous records reviewed.  Patient has seen Dr. Harl Bowie cardiology.  Records indicate she does have a history of paroxysmal A-fib.  Patient is already on Coumadin.  She is also on Lopressor at home.  Patient had spontaneously converted during her emergency room visit of September 2022.  Plan was to refer to EP if she had progressive symptoms ?  ? ?Home Medications ?Prior to Admission medications   ?Medication Sig Start Date End Date Taking? Authorizing Provider  ?albuterol (PROVENTIL) (2.5 MG/3ML) 0.083% nebulizer solution Take 3 mLs (2.5 mg total) by nebulization every 6 (six) hours as needed for wheezing or shortness of breath. 09/08/18  Yes Gerlene Fee, NP  ?albuterol (VENTOLIN HFA) 108 (90 Base) MCG/ACT inhaler Inhale 1-2 puffs into the lungs every 6 (six) hours as needed for wheezing or shortness of breath.   Yes [provider]  ?amLODipine (NORVASC) 5 MG tablet Take 1 tablet (5 mg total) by mouth daily. 09/08/18  Yes Gerlene Fee, NP   ?Ascorbic Acid (VITAMIN C) 1000 MG tablet Take 1,000 mg by mouth daily.   Yes [provider]  ?B Complex Vitamins (VITAMIN B COMPLEX) TABS Take 1 tablet by mouth daily.   Yes [provider]  ?Biotin 300 MCG TABS Take 300 mcg by mouth daily after supper.    Yes [provider]  ?Calcium Carb-Cholecalciferol (CALCIUM 600 + D PO) Take 1 tablet by mouth in the morning and at bedtime.   Yes [provider]  ?denosumab (PROLIA) 60 MG/ML SOSY injection Inject 60 mg into the skin every 6 (six) months.   Yes [provider]  ?ezetimibe (ZETIA) 10 MG tablet Take 1 tablet (10 mg total) by mouth daily. 09/08/18  Yes Gerlene Fee, NP  ?Ferrous Gluconate (IRON) 240 (27 Fe) MG TABS Take 1 tablet by mouth daily. 09/08/18  Yes Gerlene Fee, NP  ?Flaxseed, Linseed, (FLAXSEED OIL) 1000 MG CAPS Take 1,000 mg by mouth daily.    Yes [provider]  ?furosemide (LASIX) 40 MG tablet Take 40 mg by mouth daily.   Yes [provider]  ?Lecithin 1200 MG CAPS Take 1,200 mg by mouth daily.   Yes [provider]  ?levothyroxine (SYNTHROID) 50 MCG tablet Take 50 mcg by mouth daily. 07/06/21  Yes [provider]  ?Lutein 40 MG CAPS Take 40 mg by mouth daily.  08/24/18  Yes [provider]  ?Magnesium 250 MG TABS Take 250 mg by mouth daily.    Yes [provider]  ?memantine (NAMENDA) 5 MG tablet TAKE 2 TABLETS BY MOUTH TWICE DAILY 06/26/21  Yes Suzzanne Cloud, NP  ?metoprolol tartrate (LOPRESSOR) 25 MG tablet Take 1 tablet (25 mg total) by mouth 2 (two) times daily. (MAY TAKE AN ADDITIONAL '25MG'$  AS NEEDED FOR PALPITATIONS) 02/21/21  Yes Branch, Alphonse Guild, MD  ?Omega-3 Fatty Acids (FISH OIL) 1200 MG CAPS Take 1,200 mg by mouth daily.   Yes [provider]  ?pantoprazole (PROTONIX) 40 MG tablet Take 1 tablet (40 mg total) by mouth daily. 12/18/18  Yes Johnson, Clanford L, MD  ?potassium chloride (KLOR-CON M) 10 MEQ tablet Take 10 mEq by  mouth 2 (two) times daily. 06/10/21  Yes [provider]  ?Turmeric 500 MG TABS Take 500 mg by mouth daily.    Yes [provider]  ?vitamin E 180 MG (400 UNITS) capsule Take 400 Units by mouth daily.   Yes [provider]  ?warfarin (COUMADIN) 4 MG tablet Take 1 tablet (4 mg total) by mouth daily. 09/08/18  Yes Gerlene Fee, NP  ?Zinc 50 MG TABS Take 50 mg by mouth daily.    Yes [provider]  ?   ? ?Allergies    ?Epinephrine, Hydrocodone-acetaminophen, Demerol [meperidine], and Vicodin [hydrocodone-acetaminophen]   ? ?Review of Systems   ?Review of Systems  ?Constitutional:  Negative for fever.  ?Cardiovascular:  Positive for chest pain.  ? ?Physical Exam ?Updated Vital Signs ?BP (!) 165/74   Pulse (!) 58   Temp 98.3 ?F (36.8 ?C) (Oral)   Resp 16   Ht 1.524 m (5')   Wt 63.5 kg   SpO2 99%   BMI 27.34 kg/m?  ?Physical Exam ?Vitals and nursing note reviewed.  ?Constitutional:   ?   Appearance: She is not diaphoretic.  ?HENT:  ?   Head: Normocephalic and atraumatic.  ?   Right Ear: External ear normal.  ?   Left Ear: External ear normal.  ?Eyes:  ?   General: No scleral icterus.    ?   Right eye: No discharge.     ?   Left eye: No discharge.  ?   Conjunctiva/sclera: Conjunctivae normal.  ?Neck:  ?   Trachea: No tracheal deviation.  ?Cardiovascular:  ?   Rate and Rhythm: Tachycardia present. Rhythm irregular.  ?Pulmonary:  ?   Effort: Pulmonary effort is normal. No respiratory distress.  ?   Breath sounds: Normal breath sounds. No stridor. No wheezing or rales.  ?Abdominal:  ?   General: Bowel sounds are normal. There is no distension.  ?   Palpations: Abdomen is soft.  ?   Tenderness: There is no abdominal tenderness. There is no guarding or rebound.  ?Musculoskeletal:     ?   General: No tenderness or deformity.  ?   Cervical back: Neck supple.  ?   Right lower leg: Edema present.  ?   Left lower leg: Edema present.  ?Skin: ?   General: Skin is warm and dry.  ?    Findings: No rash.  ?Neurological:  ?   General: No focal deficit present.  ?   Mental Status: She is alert.  ?   Cranial Nerves: No cranial nerve deficit (no facial droop, extraocular movements intact, no slurred speech).  ?   Sensory: No sensory deficit.  ?   Motor: No abnormal muscle  tone or seizure activity.  ?   Coordination: Coordination normal.  ?Psychiatric:     ?   Mood and Affect: Mood normal.  ? ? ?ED Results / Procedures / Treatments   ?Labs ?(all labs ordered are listed, but only abnormal results are displayed) ?Labs Reviewed  ?BASIC METABOLIC PANEL - Abnormal; Notable for the following components:  ?    Result Value  ? Potassium 3.4 (*)   ? Glucose, Bld 142 (*)   ? BUN 36 (*)   ? Creatinine, Ser 1.08 (*)   ? GFR, Estimated 51 (*)   ? All other components within normal limits  ?PROTIME-INR - Abnormal; Notable for the following components:  ? Prothrombin Time 21.2 (*)   ? INR 1.8 (*)   ? All other components within normal limits  ?CBG MONITORING, ED - Abnormal; Notable for the following components:  ? Glucose-Capillary 135 (*)   ? All other components within normal limits  ?TROPONIN I (HIGH SENSITIVITY) - Abnormal; Notable for the following components:  ? Troponin I (High Sensitivity) 18 (*)   ? All other components within normal limits  ?TROPONIN I (HIGH SENSITIVITY) - Abnormal; Notable for the following components:  ? Troponin I (High Sensitivity) 47 (*)   ? All other components within normal limits  ?TROPONIN I (HIGH SENSITIVITY) - Abnormal; Notable for the following components:  ? Troponin I (High Sensitivity) 160 (*)   ? All other components within normal limits  ?CBC  ? ? ?EKG ?EKG Interpretation ? ?Date/Time:  Wednesday August 07 2021 07:19:30 EDT ?Ventricular Rate:  141 ?PR Interval:    ?QRS Duration: 82 ?QT Interval:  321 ?QTC Calculation: 487 ?R Axis:   54 ?Text Interpretation: Atrial fibrillation with rapid V-rate Low voltage, precordial leads Repolarization abnormality, prob rate related a fib  new since last tracing Confirmed by Dorie Rank [phone]) on 08/07/2021 7:23:35 AM ? ? EKG Interpretation ? ?Date/Time:  Wednesday August 07 2021 08:02:33 EDT ?Ventricular Rate:  63 ?PR Interval:  168 ?QRS Duration: 82 ?

## 2021-08-07 NOTE — ED Triage Notes (Signed)
Pt brought in by [facility] from home with c/o chest pain. Pt was found to be in A. Fib with RVR with HR of 180, BP 104/60. Pt was given Cardizem '15mg'$  and HR dropped to 140, BP 134/90.  ?

## 2021-08-07 NOTE — ED Notes (Signed)
Pt converted to NSR and HR 60. EDP made aware and cardizem stopped  ?

## 2021-08-07 NOTE — Discharge Instructions (Addendum)
Continue your current medications.  Follow-up with Dr. Harl Bowie for further evaluation of your atrial fibrillation.  Return to the ED for any recurrent symptoms ?

## 2021-08-10 DIAGNOSIS — I480 Paroxysmal atrial fibrillation: Secondary | ICD-10-CM | POA: Diagnosis not present

## 2021-08-10 DIAGNOSIS — M25551 Pain in right hip: Secondary | ICD-10-CM | POA: Diagnosis not present

## 2021-08-16 ENCOUNTER — Ambulatory Visit: Payer: PPO | Admitting: Cardiology

## 2021-08-19 DIAGNOSIS — M25551 Pain in right hip: Secondary | ICD-10-CM | POA: Diagnosis not present

## 2021-08-19 DIAGNOSIS — I480 Paroxysmal atrial fibrillation: Secondary | ICD-10-CM | POA: Diagnosis not present

## 2021-08-19 DIAGNOSIS — R140 Abdominal distension (gaseous): Secondary | ICD-10-CM | POA: Diagnosis not present

## 2021-08-19 DIAGNOSIS — Z7901 Long term (current) use of anticoagulants: Secondary | ICD-10-CM | POA: Diagnosis not present

## 2021-08-19 DIAGNOSIS — M545 Low back pain, unspecified: Secondary | ICD-10-CM | POA: Diagnosis not present

## 2021-08-20 DIAGNOSIS — Z6827 Body mass index (BMI) 27.0-27.9, adult: Secondary | ICD-10-CM | POA: Diagnosis not present

## 2021-08-20 DIAGNOSIS — M545 Low back pain, unspecified: Secondary | ICD-10-CM | POA: Diagnosis not present

## 2021-08-20 DIAGNOSIS — I10 Essential (primary) hypertension: Secondary | ICD-10-CM | POA: Diagnosis not present

## 2021-08-20 DIAGNOSIS — G8929 Other chronic pain: Secondary | ICD-10-CM | POA: Diagnosis not present

## 2021-08-23 DIAGNOSIS — I10 Essential (primary) hypertension: Secondary | ICD-10-CM | POA: Diagnosis not present

## 2021-08-23 DIAGNOSIS — E785 Hyperlipidemia, unspecified: Secondary | ICD-10-CM | POA: Diagnosis not present

## 2021-09-05 ENCOUNTER — Ambulatory Visit: Payer: PPO | Admitting: Cardiology

## 2021-09-05 ENCOUNTER — Encounter: Payer: Self-pay | Admitting: Cardiology

## 2021-09-05 VITALS — BP 150/76 | HR 58 | Ht 60.0 in | Wt 142.8 lb

## 2021-09-05 DIAGNOSIS — I480 Paroxysmal atrial fibrillation: Secondary | ICD-10-CM | POA: Diagnosis not present

## 2021-09-05 MED ORDER — FUROSEMIDE 40 MG PO TABS: 40.0000 mg | ORAL_TABLET | Freq: Every day | ORAL | 6 refills | Status: DC

## 2021-09-05 NOTE — Progress Notes (Signed)
? ? ? ?Clinical Summary ?[name] is a 86 y.o.female seen today for follow up of the following medical problems.  ?  ? ?1. Palpitations/ New diagnosis of paroxysmal afib ?- seen in ER 01/29/21 with abdominal pain, palpitations. Awoke her from sleep.  ?- found to be in afib, new diagnosis for her. Self converted in ER ?- already on coumadin ?- 11/2018 echo LVEF 60-65%, mild LAE, mild MR ?-has been on lopressor '25mg'$  bid for a number of years ? ?07/2021 ER visit with chest pain, palpitatins ?- EMS eval HRs 180s, found to be in afib with RVR ?- given IV diltiazem '15mg'$  and then drip, converted to SR ?- trop up to 160 thought to be rate related. 10/2020 nuclear stress no ischemia ? ?- in general 2-3 times per week, usually 5 minutes ?- episode in March was more severe than normal episodes. +SOB ?  ?2. CVA/TIAs ?- reports this is why she is coumadin, INRs followed by pcp.  ?- INRs followed by Dr Nevada Crane ? ? ?  ?Past Medical History:  ?Diagnosis Date  ? Adenomatous colon polyp 2008  ? Anxiety   ? Asthma   ? has not needed in over a year  ? Complication of anesthesia   ? pt reports TIA and sezures after surgery  ? Coronary artery disease   ? CVA (cerebral vascular accident) [facility])   ? x 3  ? Degenerative disc disease, lumbar   ? Depression   ? Diverticulosis   ? GERD (gastroesophageal reflux disease)   ? 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 Hemorrhoids 2007  ? History of kidney stones   ? HTN (hypertension)   ? Hyperplastic colon polyp 2005  ? Hypothyroidism   ? Narcolepsy   ? PONV (postoperative nausea and vomiting)   ? TIA (transient ischemic attack)   ? ? ? ?Allergies  ?Allergen Reactions  ? Epinephrine Anaphylaxis  ? Hydrocodone-Acetaminophen   ? Demerol [Meperidine] Other (See Comments)  ?  headaches  ? Vicodin [Hydrocodone-Acetaminophen] Other (See Comments)  ?  [name] it makes her crazy. Pt tolerates acetaminophen  ? ? ? ?Current Outpatient Medications  ?Medication Sig Dispense Refill  ? albuterol  (PROVENTIL) (2.5 MG/3ML) 0.083% nebulizer solution Take 3 mLs (2.5 mg total) by nebulization every 6 (six) hours as needed for wheezing or shortness of breath. 75 mL 0  ? albuterol (VENTOLIN HFA) 108 (90 Base) MCG/ACT inhaler Inhale 1-2 puffs into the lungs every 6 (six) hours as needed for wheezing or shortness of breath.    ? amLODipine (NORVASC) 5 MG tablet Take 1 tablet (5 mg total) by mouth daily. 30 tablet 0  ? Ascorbic Acid (VITAMIN C) 1000 MG tablet Take 1,000 mg by mouth daily.    ? B Complex Vitamins (VITAMIN B COMPLEX) TABS Take 1 tablet by mouth daily.    ? Biotin 300 MCG TABS Take 300 mcg by mouth daily after supper.     ? Calcium Carb-Cholecalciferol (CALCIUM 600 + D PO) Take 1 tablet by mouth in the morning and at bedtime.    ? denosumab (PROLIA) 60 MG/ML SOSY injection Inject 60 mg into the skin every 6 (six) months.    ? ezetimibe (ZETIA) 10 MG tablet Take 1 tablet (10 mg total) by mouth daily. 30 tablet 0  ? Ferrous Gluconate (IRON) 240 (27 Fe) MG TABS Take 1 tablet by mouth daily. 30 tablet 0  ? Flaxseed, Linseed, (FLAXSEED  OIL) 1000 MG CAPS Take 1,000 mg by mouth daily.     ? furosemide (LASIX) 40 MG tablet Take 40 mg by mouth daily.    ? Lecithin 1200 MG CAPS Take 1,200 mg by mouth daily.    ? levothyroxine (SYNTHROID) 50 MCG tablet Take 50 mcg by mouth daily.    ? Lutein 40 MG CAPS Take 40 mg by mouth daily.     ? Magnesium 250 MG TABS Take 250 mg by mouth daily.     ? memantine (NAMENDA) 5 MG tablet TAKE 2 TABLETS BY MOUTH TWICE DAILY 120 tablet 2  ? metoprolol tartrate (LOPRESSOR) 25 MG tablet Take 1 tablet (25 mg total) by mouth 2 (two) times daily. (MAY TAKE AN ADDITIONAL '25MG'$  AS NEEDED FOR PALPITATIONS) 90 tablet 6  ? Omega-3 Fatty Acids (FISH OIL) 1200 MG CAPS Take 1,200 mg by mouth daily.    ? pantoprazole (PROTONIX) 40 MG tablet Take 1 tablet (40 mg total) by mouth daily.    ? potassium chloride (KLOR-CON M) 10 MEQ tablet Take 10 mEq by mouth 2 (two) times daily.    ? Turmeric 500 MG  TABS Take 500 mg by mouth daily.     ? vitamin E 180 MG (400 UNITS) capsule Take 400 Units by mouth daily.    ? warfarin (COUMADIN) 4 MG tablet Take 1 tablet (4 mg total) by mouth daily. 30 tablet 0  ? Zinc 50 MG TABS Take 50 mg by mouth daily.     ? ?No current facility-administered medications for this visit.  ? ? ? ?Past Surgical History:  ?Procedure Laterality Date  ? ABDOMINAL HYSTERECTOMY    ? age 33, complicated by poor wound healing, followed by revision of scar and radiation for ?malignancy  ? BACK SURGERY  07/2018  ? lost 2 inches  ? BREAST ENHANCEMENT SURGERY    ? BREAST IMPLANT EXCHANGE Right 06/23/2016  ? Procedure: RIGHT BREAST IMPLANT REMOVAL AND REPLACEMENT;  Surgeon: Cristine Polio, MD;  Location: [place];  Service: Plastics;  Laterality: Right;  ? COLONOSCOPY  2005  ? 2 hyperplastic polyps  ? COLONOSCOPY  2007  ? Dr. Oneida Alar- hemorrhoids  ? COLONOSCOPY  2008  ? Dr. Reed Pandy polyp- rare sigmoid diverticulosis, internal hemorrhoids  ? COLONOSCOPY  12/01/2011  ? Procedure: COLONOSCOPY;  Surgeon: Danie Binder, MD;  [facility];  Service: Endoscopy;  Laterality: N/A;  12:30 PM  ? CYSTOSCOPY/URETEROSCOPY/HOLMIUM LASER/STENT PLACEMENT Left 02/29/2020  ? Procedure: CYSTOSCOPY/URETEROSCOPY/HOLMIUM LASER/STENT PLACEMENT;  Surgeon: Ceasar Mons, MD;  [facility]: [facility];  Service: Urology;  Laterality: Left;  ? epidural steroid injection    ? She is getting injections with Dr. Nelva Bush q3wks.  ? ESOPHAGOGASTRODUODENOSCOPY (EGD) WITH ESOPHAGEAL DILATION N/A 04/15/2013  ? Procedure: ESOPHAGOGASTRODUODENOSCOPY (EGD) WITH ESOPHAGEAL DILATION;  Surgeon: Danie Binder, MD;  [facility];  Service: Endoscopy;  Laterality: N/A;  11:45-moved to 12:30 Darius Bump to notify pt  ? EYE SURGERY  2010  ? EYE SURGERY    ? IR KYPHO LUMBAR INC FX REDUCE BONE BX UNI/BIL CANNULATION INC/IMAGING  08/23/2018  ? IR RADIOLOGIST EVAL & MGMT  12/23/2018  ? right thumb surgery     ? ? ? ?Allergies  ?Allergen Reactions  ? Epinephrine Anaphylaxis  ? Hydrocodone-Acetaminophen   ? Demerol [Meperidine] Other (See Comments)  ?  headaches  ? Vicodin [Hydrocodone-Acetaminophen] Other (See Comments)  ?  [name] it makes her crazy. Pt tolerates acetaminophen  ? ? ? ? ?  Family History  ?Problem Relation Age of Onset  ? Colon cancer Father   ?     age 26  ? Prostate cancer Father   ? Pancreatic cancer Mother   ?     age 63  ? ? ? ?Social History ?Ms. Nordell reports that she has never smoked. She has never used smokeless tobacco. ?Ms. Scollard reports no history of alcohol use. ? ? ?Review of Systems ?CONSTITUTIONAL: No weight loss, fever, chills, weakness or fatigue.  ?HEENT: Eyes: No visual loss, blurred vision, double vision or yellow sclerae.No hearing loss, sneezing, congestion, runny nose or sore throat.  ?SKIN: No rash or itching.  ?CARDIOVASCULAR: per hpi ?RESPIRATORY: No shortness of breath, cough or sputum.  ?GASTROINTESTINAL: No anorexia, nausea, vomiting or diarrhea. No abdominal pain or blood.  ?GENITOURINARY: No burning on urination, no polyuria ?NEUROLOGICAL: No headache, dizziness, syncope, paralysis, ataxia, numbness or tingling in the extremities. No change in bowel or bladder control.  ?MUSCULOSKELETAL: No muscle, back pain, joint pain or stiffness.  ?LYMPHATICS: No enlarged nodes. No history of splenectomy.  ?PSYCHIATRIC: No history of depression or anxiety.  ?ENDOCRINOLOGIC: No reports of sweating, cold or heat intolerance. No polyuria or polydipsia.  ?. ? ? ?Physical Examination ?Today's Vitals  ? 09/05/21 1007  ?BP: (!) 150/76  ?Pulse: (!) 58  ?SpO2: 95%  ?Weight: 142 lb 12.8 oz (64.8 kg)  ?Height: 5' (1.524 m)  ? ?Body mass index is 27.89 kg/m?. ? ?Gen: resting comfortably, no acute distress ?HEENT: no scleral icterus, pupils equal round and reactive, no palptable cervical adenopathy,  ?CV": RRR, no m/r/g no jvd ?Resp: Clear to auscultation bilaterally ?GI: abdomen is soft,  non-tender, non-distended, normal bowel sounds, no hepatosplenomegaly ?MSK: extremities are warm, no edema.  ?Skin: warm, no rash ?Neuro:  no focal deficits ?Psych: appropriate affect ? ? ?Diagnostic Studies ? ?2

## 2021-09-05 NOTE — Patient Instructions (Addendum)
Medication Instructions:  ?Change your Lasix to '40mg'$  daily, may take an additional '20mg'$  as needed for swelling  ?Continue all other medications.    ? ?Labwork: ?none ? ?Testing/Procedures: ?none ? ?Follow-Up: ?6 months  ? ?Any Other Special Instructions Will Be Listed Below (If Applicable). ?You have been referred to:  Dr. Lovena Le for [place] office  ? ?If you need a refill on your cardiac medications before your next appointment, please call your pharmacy. ? ?

## 2021-09-11 DIAGNOSIS — Z515 Encounter for palliative care: Secondary | ICD-10-CM | POA: Diagnosis not present

## 2021-09-11 DIAGNOSIS — Z7901 Long term (current) use of anticoagulants: Secondary | ICD-10-CM | POA: Diagnosis not present

## 2021-09-11 DIAGNOSIS — M545 Low back pain, unspecified: Secondary | ICD-10-CM | POA: Diagnosis not present

## 2021-09-11 DIAGNOSIS — M25551 Pain in right hip: Secondary | ICD-10-CM | POA: Diagnosis not present

## 2021-09-11 DIAGNOSIS — D6869 Other thrombophilia: Secondary | ICD-10-CM | POA: Diagnosis not present

## 2021-09-11 DIAGNOSIS — I5032 Chronic diastolic (congestive) heart failure: Secondary | ICD-10-CM | POA: Diagnosis not present

## 2021-09-11 DIAGNOSIS — I480 Paroxysmal atrial fibrillation: Secondary | ICD-10-CM | POA: Diagnosis not present

## 2021-09-11 DIAGNOSIS — Z66 Do not resuscitate: Secondary | ICD-10-CM | POA: Diagnosis not present

## 2021-09-12 ENCOUNTER — Encounter: Payer: Self-pay | Admitting: Internal Medicine

## 2021-09-12 ENCOUNTER — Ambulatory Visit: Payer: PPO | Admitting: Internal Medicine

## 2021-09-12 VITALS — BP 138/78 | HR 70 | Ht 60.0 in | Wt 142.0 lb

## 2021-09-12 DIAGNOSIS — F0390 Unspecified dementia without behavioral disturbance: Secondary | ICD-10-CM | POA: Insufficient documentation

## 2021-09-12 DIAGNOSIS — F03B3 Unspecified dementia, moderate, with mood disturbance: Secondary | ICD-10-CM | POA: Diagnosis not present

## 2021-09-12 DIAGNOSIS — I480 Paroxysmal atrial fibrillation: Secondary | ICD-10-CM

## 2021-09-12 DIAGNOSIS — F331 Major depressive disorder, recurrent, moderate: Secondary | ICD-10-CM | POA: Diagnosis not present

## 2021-09-12 DIAGNOSIS — R140 Abdominal distension (gaseous): Secondary | ICD-10-CM | POA: Diagnosis not present

## 2021-09-12 DIAGNOSIS — Z7901 Long term (current) use of anticoagulants: Secondary | ICD-10-CM | POA: Diagnosis not present

## 2021-09-12 DIAGNOSIS — R109 Unspecified abdominal pain: Secondary | ICD-10-CM | POA: Diagnosis not present

## 2021-09-12 MED ORDER — FLECAINIDE ACETATE 50 MG PO TABS: 50.0000 mg | ORAL_TABLET | Freq: Two times a day (BID) | ORAL | 3 refills | Status: DC

## 2021-09-12 NOTE — Patient Instructions (Signed)
Medication Instructions:  ?Your physician has recommended you make the following change in your medication:  ? ?Start Flecainide 50 mg Two Times Daily  ?  ?*If you need a refill on your cardiac medications before your next appointment, please call your pharmacy* ? ? ?Lab Work: ?NONE  ? ?If you have labs (blood work) drawn today and your tests are completely normal, you will receive your results only by: ?MyChart Message (if you have MyChart) OR ?A paper copy in the mail ?If you have any lab test that is abnormal or we need to change your treatment, we will call you to review the results. ? ? ?Testing/Procedures: ?NONE  ? ? ?Follow-Up: ?At [facility], you and your health needs are our priority.  As part of our continuing mission to provide you with exceptional heart care, we have created designated Provider Care Teams.  These Care Teams include your primary Cardiologist (physician) and Advanced Practice Providers (APPs -  Physician Assistants and Nurse Practitioners) who all work together to provide you with the care you need, when you need it. ? ?We recommend signing up for the patient portal called "MyChart".  Sign up information is provided on this After Visit Summary.  MyChart is used to connect with patients for Virtual Visits (Telemedicine).  Patients are able to view lab/test results, encounter notes, upcoming appointments, etc.  Non-urgent messages can be sent to your provider as well.   ?To learn more about what you can do with MyChart, go to [email or web address].   ? ?Your next appointment:   ?6 month(s) ? ?The format for your next appointment:   ?In Person ? ?Provider:   ?Cristopher Peru, MD  ? ? ?Other Instructions ?Thank you for choosing [facility]! ?  ? ?Important Information About [employer] ? ? ? ? ?  ?

## 2021-09-12 NOTE — Progress Notes (Signed)
? ? ? ? ?HPI ?[name] is referred today by Dr. Harl Bowie for evaluation of atrial fib. She is a pleasant 86 yo woman with PAF and a RVR and is quite symptomatic. She experiences chest pain and palpitations with her atrial fib. She has been placed on eliquis. She denies syncope. She has been on a beta blocker for a long time to try and control the atrial fib. She also has a h/o spine fracture and is s/p Kyphoplasty. ?Allergies  ?Allergen Reactions  ? Epinephrine Anaphylaxis  ? Hydrocodone-Acetaminophen   ? Demerol [Meperidine] Other (See Comments)  ?  headaches  ? Vicodin [Hydrocodone-Acetaminophen] Other (See Comments)  ?  [name] it makes her crazy. Pt tolerates acetaminophen  ? ? ? ?Current Outpatient Medications  ?Medication Sig Dispense Refill  ? albuterol (PROVENTIL) (2.5 MG/3ML) 0.083% nebulizer solution Take 3 mLs (2.5 mg total) by nebulization every 6 (six) hours as needed for wheezing or shortness of breath. 75 mL 0  ? albuterol (VENTOLIN HFA) 108 (90 Base) MCG/ACT inhaler Inhale 1-2 puffs into the lungs every 6 (six) hours as needed for wheezing or shortness of breath.    ? amLODipine (NORVASC) 5 MG tablet Take 1 tablet (5 mg total) by mouth daily. 30 tablet 0  ? Ascorbic Acid (VITAMIN C) 1000 MG tablet Take 1,000 mg by mouth daily.    ? B Complex Vitamins (VITAMIN B COMPLEX) TABS Take 1 tablet by mouth daily.    ? Biotin 300 MCG TABS Take 300 mcg by mouth daily after supper.     ? Calcium Carb-Cholecalciferol (CALCIUM 600 + D PO) Take 1 tablet by mouth in the morning and at bedtime.    ? denosumab (PROLIA) 60 MG/ML SOSY injection Inject 60 mg into the skin every 6 (six) months.    ? ELIQUIS 5 MG TABS tablet Take 5 mg by mouth 2 (two) times daily.    ? ezetimibe (ZETIA) 10 MG tablet Take 1 tablet (10 mg total) by mouth daily. 30 tablet 0  ? famotidine (PEPCID) 20 MG tablet Take 20 mg by mouth 2 (two) times daily.    ? Ferrous Gluconate (IRON) 240 (27 Fe) MG TABS Take 1 tablet by mouth daily. 30 tablet 0   ? Flaxseed, Linseed, (FLAXSEED OIL) 1000 MG CAPS Take 1,000 mg by mouth daily.     ? furosemide (LASIX) 40 MG tablet Take 1 tablet (40 mg total) by mouth daily. (May take an additional '20mg'$  as needed for swelling.) 45 tablet 6  ? Lecithin 1200 MG CAPS Take 1,200 mg by mouth daily.    ? levothyroxine (SYNTHROID) 75 MCG tablet Take 75 mcg by mouth daily.    ? Lutein 40 MG CAPS Take 40 mg by mouth daily.     ? Magnesium 250 MG TABS Take 250 mg by mouth daily.     ? memantine (NAMENDA) 5 MG tablet TAKE 2 TABLETS BY MOUTH TWICE DAILY 120 tablet 2  ? metoprolol tartrate (LOPRESSOR) 25 MG tablet Take 1 tablet (25 mg total) by mouth 2 (two) times daily. (MAY TAKE AN ADDITIONAL '25MG'$  AS NEEDED FOR PALPITATIONS) 90 tablet 6  ? Omega-3 Fatty Acids (FISH OIL) 1200 MG CAPS Take 1,200 mg by mouth daily.    ? pantoprazole (PROTONIX) 40 MG tablet Take 1 tablet (40 mg total) by mouth daily.    ? potassium chloride (KLOR-CON M) 10 MEQ tablet Take 10 mEq by mouth 2 (two) times daily.    ? Turmeric 500 MG TABS  Take 500 mg by mouth daily.     ? vitamin E 180 MG (400 UNITS) capsule Take 400 Units by mouth daily.    ? Zinc 50 MG TABS Take 50 mg by mouth daily.     ? ?No current facility-administered medications for this visit.  ? ? ? ?Past Medical History:  ?Diagnosis Date  ? Adenomatous colon polyp 2008  ? Anxiety   ? Asthma   ? has not needed in over a year  ? Complication of anesthesia   ? pt reports TIA and sezures after surgery  ? Coronary artery disease   ? CVA (cerebral vascular accident) [facility])   ? x 3  ? Degenerative disc disease, lumbar   ? Depression   ? Diverticulosis   ? GERD (gastroesophageal reflux disease)   ? 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1949  ? age 74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 2 weeks  ? Hemorrhoids 2007  ? History of kidney stones   ? HTN (hypertension)   ? Hyperplastic colon polyp 2005  ? Hypothyroidism   ? Narcolepsy   ? PONV (postoperative nausea and vomiting)   ? TIA (transient ischemic attack)   ? ? ?ROS: ? ? All systems reviewed and  negative except as noted in the HPI. ? ? ?Past Surgical History:  ?Procedure Laterality Date  ? ABDOMINAL HYSTERECTOMY    ? age 89, complicated by poor wound healing, followed by revision of scar and radiation for ?malignancy  ? BACK SURGERY  07/2018  ? lost 2 inches  ? BREAST ENHANCEMENT SURGERY    ? BREAST IMPLANT EXCHANGE Right 06/23/2016  ? Procedure: RIGHT BREAST IMPLANT REMOVAL AND REPLACEMENT;  Surgeon: Cristine Polio, MD;  Location: [place];  Service: Plastics;  Laterality: Right;  ? COLONOSCOPY  2005  ? 2 hyperplastic polyps  ? COLONOSCOPY  2007  ? Dr. Oneida Alar- hemorrhoids  ? COLONOSCOPY  2008  ? Dr. Reed Pandy polyp- rare sigmoid diverticulosis, internal hemorrhoids  ? COLONOSCOPY  12/01/2011  ? Procedure: COLONOSCOPY;  Surgeon: Danie Binder, MD;  [facility];  Service: Endoscopy;  Laterality: N/A;  12:30 PM  ? CYSTOSCOPY/URETEROSCOPY/HOLMIUM LASER/STENT PLACEMENT Left 02/29/2020  ? Procedure: CYSTOSCOPY/URETEROSCOPY/HOLMIUM LASER/STENT PLACEMENT;  Surgeon: Ceasar Mons, MD;  [facility];  Service: Urology;  Laterality: Left;  ? epidural steroid injection    ? She is getting injections with Dr. Nelva Bush q3wks.  ? ESOPHAGOGASTRODUODENOSCOPY (EGD) WITH ESOPHAGEAL DILATION N/A 04/15/2013  ? Procedure: ESOPHAGOGASTRODUODENOSCOPY (EGD) WITH ESOPHAGEAL DILATION;  Surgeon: Danie Binder, MD;  [facility];  Service: Endoscopy;  Laterality: N/A;  11:45-moved to 12:30 Darius Bump to notify pt  ? EYE SURGERY  2010  ? EYE SURGERY    ? IR KYPHO LUMBAR INC FX REDUCE BONE BX UNI/BIL CANNULATION INC/IMAGING  08/23/2018  ? IR RADIOLOGIST EVAL & MGMT  12/23/2018  ? right thumb surgery    ? ? ? ?Family History  ?Problem Relation Age of Onset  ? Colon cancer Father   ?     age 27  ? Prostate cancer Father   ? Pancreatic cancer Mother   ?     age 72  ? ? ? ?Social History  ? ?Socioeconomic History  ? Marital status: Widowed  ?  Spouse name: Not on file  ? Number of  children: Not on file  ? Years of education: Not on file  ? Highest education level: Not on file  ?Occupational History  ? Not on file  ?  Tobacco Use  ? Smoking status: Never  ? Smokeless tobacco: Never  ?Vaping Use  ? Vaping Use: Never used  ?Substance and Sexual Activity  ? Alcohol use: No  ? Drug use: No  ? Sexual activity: Never  ?Other Topics Concern  ? Not on file  ?Social History Narrative  ? Lives alone  ? Right Handed   ? Drinks no caffeine daily  ? ?Social Determinants of Health  ? ?Financial Resource Strain: Not on file  ?Food Insecurity: Not on file  ?Transportation Needs: Not on file  ?Physical Activity: Not on file  ?Stress: Not on file  ?Social Connections: Not on file  ?Intimate Partner Violence: Not on file  ? ? ? ?BP 138/78   Pulse 70   Ht 5' (1.524 m)   Wt 142 lb (64.4 kg)   SpO2 97%   BMI 27.73 kg/m?  ? ?Physical Exam: ? ?Well appearing NAD ?HEENT: Unremarkable ?Neck:  No JVD, no thyromegally ?Lymphatics:  No adenopathy ?Back:  No CVA tenderness ?Lungs:  Clear ?HEART:  Regular rate rhythm, no murmurs, no rubs, no clicks ?Abd:  soft, positive bowel sounds, no organomegally, no rebound, no guarding ?Ext:  2 plus pulses, no edema, no cyanosis, no clubbing ?Skin:  No rashes no nodules ?Neuro:  CN II through XII intact, motor grossly intact ? ?Assess/Plan:  ?PAF - she is quite symptomatic. I have recommended she start low dose flecainide. She will return for a 12 lead ecg in 2 weeks. Continue beta blocker ?Coags - she will continue her eliquis. ? ?Carleene Overlie Natalyn Szymanowski,MD ?

## 2021-09-18 ENCOUNTER — Other Ambulatory Visit (HOSPITAL_COMMUNITY): Payer: Self-pay | Admitting: Internal Medicine

## 2021-09-18 DIAGNOSIS — R109 Unspecified abdominal pain: Secondary | ICD-10-CM

## 2021-09-22 DIAGNOSIS — E782 Mixed hyperlipidemia: Secondary | ICD-10-CM | POA: Diagnosis not present

## 2021-09-22 DIAGNOSIS — I5032 Chronic diastolic (congestive) heart failure: Secondary | ICD-10-CM | POA: Diagnosis not present

## 2021-09-22 DIAGNOSIS — I130 Hypertensive heart and chronic kidney disease with heart failure and stage 1 through stage 4 chronic kidney disease, or unspecified chronic kidney disease: Secondary | ICD-10-CM | POA: Diagnosis not present

## 2021-09-22 DIAGNOSIS — N1831 Chronic kidney disease, stage 3a: Secondary | ICD-10-CM | POA: Diagnosis not present

## 2021-09-24 ENCOUNTER — Other Ambulatory Visit (HOSPITAL_COMMUNITY): Payer: Self-pay | Admitting: Family Medicine

## 2021-09-24 ENCOUNTER — Ambulatory Visit (HOSPITAL_COMMUNITY)
Admission: RE | Admit: 2021-09-24 | Discharge: 2021-09-24 | Disposition: A | Discharge status: Home / Self Care | Payer: PPO | Source: Ambulatory Visit | Attending: Family Medicine | Admitting: Family Medicine

## 2021-09-24 DIAGNOSIS — R062 Wheezing: Secondary | ICD-10-CM | POA: Diagnosis not present

## 2021-09-26 ENCOUNTER — Telehealth: Payer: Self-pay | Admitting: Cardiology

## 2021-09-26 ENCOUNTER — Ambulatory Visit (INDEPENDENT_AMBULATORY_CARE_PROVIDER_SITE_OTHER): Payer: PPO

## 2021-09-26 VITALS — BP 138/78 | HR 68 | Ht 60.0 in | Wt 142.0 lb

## 2021-09-26 DIAGNOSIS — I480 Paroxysmal atrial fibrillation: Secondary | ICD-10-CM | POA: Diagnosis not present

## 2021-09-26 NOTE — Patient Instructions (Signed)
Continue medications as directed ? ? ?I will call you if Dr.Taylor has any new recommendations. ? ? ? ? ? ?Thank you for choosing [facility] ! ? ? ? ? ? ? ?

## 2021-09-26 NOTE — Progress Notes (Signed)
Patient started on Flecainide 50 mg BID for PAF on 09/12/21 by Dr.Taylor ? ? ? ?Nurse visit today for EKG: NSR ? ? ?Scanned to Dr.Taylor ?

## 2021-09-26 NOTE — Telephone Encounter (Signed)
As we do not have clinic right now, patient will come for nurse visit. ?

## 2021-09-26 NOTE — Telephone Encounter (Signed)
?  Pt wants to know if she can come in earlier time for her nurse visit ?

## 2021-10-10 DIAGNOSIS — E669 Obesity, unspecified: Secondary | ICD-10-CM | POA: Diagnosis not present

## 2021-10-10 DIAGNOSIS — R062 Wheezing: Secondary | ICD-10-CM | POA: Diagnosis not present

## 2021-10-10 DIAGNOSIS — M545 Low back pain, unspecified: Secondary | ICD-10-CM | POA: Diagnosis not present

## 2021-10-10 DIAGNOSIS — Z6831 Body mass index (BMI) 31.0-31.9, adult: Secondary | ICD-10-CM | POA: Diagnosis not present

## 2021-10-10 DIAGNOSIS — F03B3 Unspecified dementia, moderate, with mood disturbance: Secondary | ICD-10-CM | POA: Diagnosis not present

## 2021-10-10 DIAGNOSIS — I480 Paroxysmal atrial fibrillation: Secondary | ICD-10-CM | POA: Diagnosis not present

## 2021-10-10 DIAGNOSIS — Z7901 Long term (current) use of anticoagulants: Secondary | ICD-10-CM | POA: Diagnosis not present

## 2021-10-10 DIAGNOSIS — R140 Abdominal distension (gaseous): Secondary | ICD-10-CM | POA: Diagnosis not present

## 2021-10-10 DIAGNOSIS — F331 Major depressive disorder, recurrent, moderate: Secondary | ICD-10-CM | POA: Diagnosis not present

## 2021-10-10 DIAGNOSIS — R109 Unspecified abdominal pain: Secondary | ICD-10-CM | POA: Diagnosis not present

## 2021-10-10 DIAGNOSIS — M25551 Pain in right hip: Secondary | ICD-10-CM | POA: Diagnosis not present

## 2021-10-11 ENCOUNTER — Other Ambulatory Visit (HOSPITAL_COMMUNITY): Payer: Self-pay | Admitting: Internal Medicine

## 2021-10-11 ENCOUNTER — Other Ambulatory Visit: Payer: Self-pay | Admitting: Internal Medicine

## 2021-10-11 DIAGNOSIS — Z7901 Long term (current) use of anticoagulants: Secondary | ICD-10-CM | POA: Diagnosis not present

## 2021-10-11 DIAGNOSIS — F0280 Dementia in other diseases classified elsewhere without behavioral disturbance: Secondary | ICD-10-CM | POA: Diagnosis not present

## 2021-10-11 DIAGNOSIS — Z515 Encounter for palliative care: Secondary | ICD-10-CM | POA: Diagnosis not present

## 2021-10-11 DIAGNOSIS — R140 Abdominal distension (gaseous): Secondary | ICD-10-CM

## 2021-10-11 DIAGNOSIS — I10 Essential (primary) hypertension: Secondary | ICD-10-CM | POA: Diagnosis not present

## 2021-10-11 DIAGNOSIS — R109 Unspecified abdominal pain: Secondary | ICD-10-CM | POA: Diagnosis not present

## 2021-10-11 DIAGNOSIS — D692 Other nonthrombocytopenic purpura: Secondary | ICD-10-CM | POA: Diagnosis not present

## 2021-10-11 DIAGNOSIS — E039 Hypothyroidism, unspecified: Secondary | ICD-10-CM | POA: Diagnosis not present

## 2021-10-11 DIAGNOSIS — M545 Low back pain, unspecified: Secondary | ICD-10-CM | POA: Diagnosis not present

## 2021-10-11 DIAGNOSIS — Z7951 Long term (current) use of inhaled steroids: Secondary | ICD-10-CM | POA: Diagnosis not present

## 2021-10-11 DIAGNOSIS — J45909 Unspecified asthma, uncomplicated: Secondary | ICD-10-CM | POA: Diagnosis not present

## 2021-10-11 DIAGNOSIS — Z79890 Hormone replacement therapy (postmenopausal): Secondary | ICD-10-CM | POA: Diagnosis not present

## 2021-10-18 ENCOUNTER — Ambulatory Visit (HOSPITAL_COMMUNITY)
Admission: RE | Admit: 2021-10-18 | Discharge: 2021-10-18 | Disposition: A | Discharge status: Home / Self Care | Payer: PPO | Source: Ambulatory Visit | Attending: Internal Medicine | Admitting: Internal Medicine

## 2021-10-18 DIAGNOSIS — N2889 Other specified disorders of kidney and ureter: Secondary | ICD-10-CM | POA: Diagnosis not present

## 2021-10-18 DIAGNOSIS — R140 Abdominal distension (gaseous): Secondary | ICD-10-CM | POA: Insufficient documentation

## 2021-10-18 DIAGNOSIS — K5730 Diverticulosis of large intestine without perforation or abscess without bleeding: Secondary | ICD-10-CM | POA: Diagnosis not present

## 2021-10-18 NOTE — Progress Notes (Signed)
Called to CT re: pt having abd pain and diarrhea.  Pt in the bathroom having episode of diarrhea upon entering department. Ambulated out of bathroom and reports abd bloating and diarrhea.  Obtained pt's v/s T- 98 P-62 R- 18 Bp- 92/56 O2 sat- 98% on r/a  Advised pt to check into the ED d/t low b/p. Transported via w/c to ED. PT went to restroom and reports had episode of diarrhea. Pt reports she is feeling better and does not want to check in to ED.  VS revaluated   T 98 P 65 R 17 BP 126/71 O2 sat 99% on r/a  Explained risk of dehydration including death to pt.  PT verbalized understanding of risk and states "I am not going to check in to ED, I feel better and want to be taken to my car." Transported back to vehicle via w/c.  Ambulatory w/ cane from w/c to car in the parking lot with no difficulties.

## 2021-10-22 ENCOUNTER — Other Ambulatory Visit (HOSPITAL_COMMUNITY): Payer: Self-pay | Admitting: Nurse Practitioner

## 2021-10-22 ENCOUNTER — Ambulatory Visit (HOSPITAL_COMMUNITY)
Admission: RE | Admit: 2021-10-22 | Discharge: 2021-10-22 | Disposition: A | Discharge status: Home / Self Care | Payer: PPO | Source: Ambulatory Visit | Attending: Nurse Practitioner | Admitting: Nurse Practitioner

## 2021-10-22 DIAGNOSIS — R059 Cough, unspecified: Secondary | ICD-10-CM | POA: Diagnosis not present

## 2021-10-22 DIAGNOSIS — R0981 Nasal congestion: Secondary | ICD-10-CM | POA: Insufficient documentation

## 2021-10-22 DIAGNOSIS — R0789 Other chest pain: Secondary | ICD-10-CM

## 2021-10-22 DIAGNOSIS — R070 Pain in throat: Secondary | ICD-10-CM | POA: Insufficient documentation

## 2021-10-22 DIAGNOSIS — R079 Chest pain, unspecified: Secondary | ICD-10-CM | POA: Diagnosis not present

## 2021-10-22 DIAGNOSIS — R062 Wheezing: Secondary | ICD-10-CM | POA: Diagnosis not present

## 2021-11-04 DIAGNOSIS — R140 Abdominal distension (gaseous): Secondary | ICD-10-CM | POA: Diagnosis not present

## 2021-11-04 DIAGNOSIS — F03B3 Unspecified dementia, moderate, with mood disturbance: Secondary | ICD-10-CM | POA: Diagnosis not present

## 2021-11-04 DIAGNOSIS — Z7901 Long term (current) use of anticoagulants: Secondary | ICD-10-CM | POA: Diagnosis not present

## 2021-11-04 DIAGNOSIS — M25551 Pain in right hip: Secondary | ICD-10-CM | POA: Diagnosis not present

## 2021-11-04 DIAGNOSIS — R109 Unspecified abdominal pain: Secondary | ICD-10-CM | POA: Diagnosis not present

## 2021-11-04 DIAGNOSIS — F331 Major depressive disorder, recurrent, moderate: Secondary | ICD-10-CM | POA: Diagnosis not present

## 2021-11-04 DIAGNOSIS — M545 Low back pain, unspecified: Secondary | ICD-10-CM | POA: Diagnosis not present

## 2021-11-04 DIAGNOSIS — I480 Paroxysmal atrial fibrillation: Secondary | ICD-10-CM | POA: Diagnosis not present

## 2021-11-05 ENCOUNTER — Ambulatory Visit: Payer: PPO | Admitting: Neurology

## 2021-11-05 ENCOUNTER — Encounter: Payer: Self-pay | Admitting: Neurology

## 2021-11-05 VITALS — BP 162/72 | HR 74 | Ht 60.0 in | Wt 144.0 lb

## 2021-11-05 DIAGNOSIS — Z87898 Personal history of other specified conditions: Secondary | ICD-10-CM

## 2021-11-05 DIAGNOSIS — R413 Other amnesia: Secondary | ICD-10-CM | POA: Diagnosis not present

## 2021-11-05 DIAGNOSIS — F32A Depression, unspecified: Secondary | ICD-10-CM

## 2021-11-05 NOTE — Patient Instructions (Signed)
Continue current medications  Follow up in 6 months or sooner if worse

## 2021-11-05 NOTE — Progress Notes (Signed)
PATIENT: [name] DOB: 1935-11-16  REASON FOR VISIT: Follow up HISTORY FROM: Patient PRIMARY NEUROLOGIST: Dr. April Manson (Dr. Jannifer Franklin has now retired)  HISTORY OF PRESENT ILLNESS: Today 11/05/21 Patient presents today for follow-up, she is accompanied by her friend.  Last visit was in December, at that time she was complaining of memory deficit but her MMSE was 27 out of 30.  She was continued on Namenda 10 mg twice daily.  Today she is reporting memory decline but also states that her mood is very low, she saw her PMD yesterday and was started on bupropion yesterday.  She has been on this medication in the past for depression.  She still complains of confusion, memory decline but again this may be related to her depression.  She still going to PT once a week but does not do a lot of exercise.  She still lives alone, independent, still drives, denies being lost in family or places and no recent accident.   INTERVAL HISTORY 05/07/2021:  [name] is an 86 year old female here today for follow-up with history of seizures, stroke, and memory trouble.  RPR, B12, sed rate was unremarkable in May 2022.  EEG was normal June 2022.  MRI of the brain showed right frontal encephalomalacia, evidence of small vessel disease that appears fairly chronic, minimal progression from MRI of the brain in 2013.  Started on [place], tolerating well. Thinks helping, remember things faster.  Lives alone, drives a car in town.  Manages her medications, finances.  Has chronic pain issues, right hip pain today. Seeing Dr. Alvan Dame for consultation right hip surgery.  On Coumadin for history of A. fib.  Is not on seizure medication, has been many years since last seizure event.  Has no new issues to discuss today.  Is here today with her friend Maudie Mercury. MMSE 27/30 today.   HISTORY 10/16/2020 Dr. Jannifer Franklin: Ms. Luckenbaugh is an 86 year old right-handed white female with a history of head trauma at age 63 with subsequent  seizures.  The patient indicates that her seizures are usually associated with sudden episodes of confusion and anxiety.  The episodes continue to occur with some regularity, and at times may occur in clusters.  The patient however is not on any medications for seizures.  The patient does not recall exactly when she was taken off of medications for her seizure disorder.  She reports a history of stroke events, the last one occurred about 7 years ago, the patient has had some difficulty with memory since that time that has gradually worsened as well.  She currently lives alone, she does not like to drive longer distances because she has difficulty getting lost.  She is doing her finances but sometimes she will pay the bills twice.  She keeps up with her medications but occasionally she will miss medication.  She keeps up with her own appointments using calendars.  She has a lot of chronic pain issues, she claims that she has had headaches throughout her life since her head trauma.  The headaches tend to be in the frontotemporal area and behind the right eye.  It is not clear from her that the headaches have changed significantly over time.  The patient is unable to tell me exactly how often the headaches occur.  She seems to be a poor historian.  The patient is having a lot of back pain and right leg discomfort and right hip pain.  She has seen Dr. Nelva Bush previously for this.  She  has a history of a prior T12 compression fracture.  The patient reports no numbness of extremities, she does have some leg swelling.  She denies any blackout episodes or difficulty controlling the bowels or the bladder.  She uses a cane to get around, she has not had any recent falls.  She does have a chronic gait instability.  She has decreased visual acuity in the right eye and she has a prism in her glasses.  REVIEW OF SYSTEMS: Out of a complete 14 system review of symptoms, the patient complains only of the following symptoms, and all  other reviewed systems are negative.  See HPI  ALLERGIES: Allergies  Allergen Reactions   Epinephrine Anaphylaxis   Hydrocodone-Acetaminophen    Demerol [Meperidine] Other (See Comments)    headaches   Vicodin [Hydrocodone-Acetaminophen] Other (See Comments)    [name] it makes her crazy. Pt tolerates acetaminophen    HOME MEDICATIONS: Outpatient Medications Prior to Visit  Medication Sig Dispense Refill   albuterol (PROVENTIL) (2.5 MG/3ML) 0.083% nebulizer solution Take 3 mLs (2.5 mg total) by nebulization every 6 (six) hours as needed for wheezing or shortness of breath. 75 mL 0   albuterol (VENTOLIN HFA) 108 (90 Base) MCG/ACT inhaler Inhale 1-2 puffs into the lungs every 6 (six) hours as needed for wheezing or shortness of breath.     amLODipine (NORVASC) 5 MG tablet Take 1 tablet (5 mg total) by mouth daily. 30 tablet 0   Ascorbic Acid (VITAMIN C) 1000 MG tablet Take 1,000 mg by mouth daily.     B Complex Vitamins (VITAMIN B COMPLEX) TABS Take 1 tablet by mouth daily.     Biotin 300 MCG TABS Take 300 mcg by mouth daily after supper.      buPROPion (WELLBUTRIN XL) 150 MG 24 hr tablet Take 150 mg by mouth every morning.     Calcium Carb-Cholecalciferol (CALCIUM 600 + D PO) Take 1 tablet by mouth in the morning and at bedtime.     denosumab (PROLIA) 60 MG/ML SOSY injection Inject 60 mg into the skin every 6 (six) months.     ELIQUIS 5 MG TABS tablet Take 5 mg by mouth 2 (two) times daily.     ezetimibe (ZETIA) 10 MG tablet Take 1 tablet (10 mg total) by mouth daily. 30 tablet 0   Ferrous Gluconate (IRON) 240 (27 Fe) MG TABS Take 1 tablet by mouth daily. 30 tablet 0   Flaxseed, Linseed, (FLAXSEED OIL) 1000 MG CAPS Take 1,000 mg by mouth daily.      flecainide (TAMBOCOR) 50 MG tablet Take 1 tablet (50 mg total) by mouth 2 (two) times daily. 180 tablet 3   furosemide (LASIX) 40 MG tablet Take 1 tablet (40 mg total) by mouth daily. (May take an additional '20mg'$  as needed for swelling.) 45  tablet 6   Lecithin 1200 MG CAPS Take 1,200 mg by mouth daily.     levothyroxine (SYNTHROID) 75 MCG tablet Take 75 mcg by mouth daily.     Lutein 40 MG CAPS Take 40 mg by mouth daily.      Magnesium 250 MG TABS Take 250 mg by mouth daily.      memantine (NAMENDA) 5 MG tablet TAKE 2 TABLETS BY MOUTH TWICE DAILY 120 tablet 2   metoprolol tartrate (LOPRESSOR) 25 MG tablet Take 1 tablet (25 mg total) by mouth 2 (two) times daily. (MAY TAKE AN ADDITIONAL '25MG'$  AS NEEDED FOR PALPITATIONS) 90 tablet 6   Omega-3 Fatty Acids (FISH  OIL) 1200 MG CAPS Take 1,200 mg by mouth daily.     pantoprazole (PROTONIX) 40 MG tablet Take 1 tablet (40 mg total) by mouth daily.     potassium chloride (KLOR-CON M) 10 MEQ tablet Take 10 mEq by mouth 2 (two) times daily.     Turmeric 500 MG TABS Take 500 mg by mouth daily.      vitamin E 180 MG (400 UNITS) capsule Take 400 Units by mouth daily.     Zinc 50 MG TABS Take 50 mg by mouth daily.      famotidine (PEPCID) 20 MG tablet Take 20 mg by mouth 2 (two) times daily.     No facility-administered medications prior to visit.    PAST MEDICAL HISTORY: Past Medical History:  Diagnosis Date   Adenomatous colon polyp 2008   Anxiety    Asthma    has not needed in over a year   Complication of anesthesia    pt reports TIA and sezures after surgery   Coronary artery disease    CVA (cerebral vascular accident) ([place])    x 3   Degenerative disc disease, lumbar    Depression    Diverticulosis    GERD (gastroesophageal reflux disease)    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35   age 31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 2 weeks   Hemorrhoids 2007   History of kidney stones    HTN (hypertension)    Hyperplastic colon polyp 2005   Hypothyroidism    Narcolepsy    PONV (postoperative nausea and vomiting)    TIA (transient ischemic attack)     PAST SURGICAL HISTORY: Past Surgical History:  Procedure Laterality Date   ABDOMINAL HYSTERECTOMY     age 51, complicated by poor wound healing, followed by  revision of scar and radiation for ?malignancy   BACK SURGERY  07/2018   lost 2 inches   BREAST ENHANCEMENT SURGERY     BREAST IMPLANT EXCHANGE Right 06/23/2016   Procedure: RIGHT BREAST IMPLANT REMOVAL AND REPLACEMENT;  Surgeon: Cristine Polio, MD;  Location: [facility];  Service: Plastics;  Laterality: Right;   COLONOSCOPY  2005   2 hyperplastic polyps   COLONOSCOPY  2007   Dr. Oneida Alar- hemorrhoids   COLONOSCOPY  2008   Dr. Reed Pandy polyp- rare sigmoid diverticulosis, internal hemorrhoids   COLONOSCOPY  12/01/2011   Procedure: COLONOSCOPY;  Surgeon: Danie Binder, MD;  [facility];  Service: Endoscopy;  Laterality: N/A;  12:30 PM   CYSTOSCOPY/URETEROSCOPY/HOLMIUM LASER/STENT PLACEMENT Left 02/29/2020   Procedure: CYSTOSCOPY/URETEROSCOPY/HOLMIUM LASER/STENT PLACEMENT;  Surgeon: Ceasar Mons, MD;  Location: [facility];  Service: Urology;  Laterality: Left;   epidural steroid injection     She is getting injections with Dr. Nelva Bush q3wks.   ESOPHAGOGASTRODUODENOSCOPY (EGD) WITH ESOPHAGEAL DILATION N/A 04/15/2013   Procedure: ESOPHAGOGASTRODUODENOSCOPY (EGD) WITH ESOPHAGEAL DILATION;  Surgeon: Danie Binder, MD;  [facility];  Service: Endoscopy;  Laterality: N/A;  11:45-moved to 12:30 Darius Bump to notify pt   EYE SURGERY  2010   EYE SURGERY     IR KYPHO LUMBAR INC FX REDUCE BONE BX UNI/BIL CANNULATION INC/IMAGING  08/23/2018   IR RADIOLOGIST EVAL & MGMT  12/23/2018   right thumb surgery      FAMILY HISTORY: Family History  Problem Relation Age of Onset   Colon cancer Father        age 56   Prostate cancer Father    Pancreatic cancer Mother  age 91    SOCIAL HISTORY: Social History   Socioeconomic History   Marital status: Widowed    Spouse name: Not on file   Number of children: Not on file   Years of education: Not on file   Highest education level: Not on file  Occupational History   Not on file  Tobacco Use    Smoking status: Never   Smokeless tobacco: Never  Vaping Use   Vaping Use: Never used  Substance and Sexual Activity   Alcohol use: No   Drug use: No   Sexual activity: Never  Other Topics Concern   Not on file  Social History Narrative   Lives alone   Right Handed    Drinks no caffeine daily   Social Determinants of Health   Financial Resource Strain: Not on file  Food Insecurity: Not on file  Transportation Needs: Not on file  Physical Activity: Not on file  Stress: Not on file  Social Connections: Not on file  Intimate Partner Violence: Not on file   PHYSICAL EXAM  Vitals:   11/05/21 1012  BP: (!) 162/72  Pulse: 74  Weight: 144 lb (65.3 kg)  Height: 5' (1.524 m)    Body mass index is 28.12 kg/m.  Generalized: Well developed, in no acute distress     11/05/2021   10:25 AM 05/07/2021   11:07 AM 10/16/2020   12:53 PM  MMSE - Mini Mental State Exam  Orientation to time '4 4 5  '$ Orientation to Place '5 4 5  '$ Registration '3 3 3  '$ Attention/ Calculation '5 4 5  '$ Recall '2 3 3  '$ Language- name 2 objects '2 2 2  '$ Language- repeat '1 1 1  '$ Language- follow 3 step command '3 3 3  '$ Language- read & follow direction '1 1 1  '$ Write a sentence '1 1 1  '$ Copy design 0 1 1  Total score '27 27 30    '$ Neurological examination  Mentation: Alert oriented to time, place, history taking. Follows all commands speech and language fluent. Flat affect  Cranial nerve II-XII: Pupils were equal round reactive to light. Extraocular movements were full, visual field were full on confrontational test, right eye deviates to the right with primary gaze. Facial sensation and strength were normal. Head turning and shoulder shrug were normal and symmetric. Motor: The motor testing reveals 5 over 5 strength of all 4 extremities. Good symmetric motor tone is noted throughout.  Sensory: Sensory testing is intact to soft touch on all 4 extremities. No evidence of extinction is noted.  Coordination: Cerebellar  testing reveals good finger-nose-finger and heel-to-shin bilaterally.  Gait and station: Has to push off from seated position to stand, gait is slightly wide-based, can walk short distances independently, otherwise uses a cane Reflexes: Deep tendon reflexes are symmetric and normal bilaterally.   DIAGNOSTIC DATA (LABS, IMAGING, TESTING) - I reviewed patient records, labs, notes, testing and imaging myself where available.  Lab Results  Component Value Date   WBC 9.3 08/07/2021   HGB 13.7 08/07/2021   HCT 42.8 08/07/2021   MCV 92.6 08/07/2021   PLT 272 08/07/2021      Component Value Date/Time   NA 142 08/07/2021 0729   K 3.4 (L) 08/07/2021 0729   CL 105 08/07/2021 0729   CO2 24 08/07/2021 0729   GLUCOSE 142 (H) 08/07/2021 0729   BUN 36 (H) 08/07/2021 0729   CREATININE 1.08 (H) 08/07/2021 0729   CREATININE 1.00 (H) 01/11/2015 4481  CALCIUM 8.9 08/07/2021 0729   PROT 6.9 01/29/2021 0910   ALBUMIN 4.1 01/29/2021 0910   AST 26 01/29/2021 0910   ALT 21 01/29/2021 0910   ALKPHOS 58 01/29/2021 0910   BILITOT 0.6 01/29/2021 0910   GFRNONAA 51 (L) 08/07/2021 0729   GFRAA 52 (L) 02/27/2020 1107   Lab Results  Component Value Date   CHOL 220 (H) 12/17/2018   HDL 60 12/17/2018   LDLCALC 131 (H) 12/17/2018   TRIG 146 12/17/2018   CHOLHDL 3.7 12/17/2018   Lab Results  Component Value Date   HGBA1C  01/04/2009    5.6 (NOTE) The ADA recommends the following therapeutic goal for glycemic control related to Hgb A1c measurement: Goal of therapy: <6.5 Hgb A1c  Reference: American Diabetes Association: Clinical Practice Recommendations 2010, Diabetes Care, 2010, 33: (Suppl  1).   Lab Results  Component Value Date   VITAMINB12 1,895 (H) 10/16/2020   Lab Results  Component Value Date   TSH 3.716 01/29/2021      ASSESSMENT AND PLAN 86 y.o. year old female  has a past medical history of Adenomatous colon polyp (2008), Anxiety, Asthma, Complication of anesthesia, Coronary artery  disease, CVA (cerebral vascular accident) ([place]), Degenerative disc disease, lumbar, Depression, Diverticulosis, GERD (gastroesophageal reflux disease), 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(1949), Hemorrhoids (2007), History of kidney stones, HTN (hypertension), Hyperplastic colon polyp (2005), Hypothyroidism, Narcolepsy, PONV (postoperative nausea and vomiting), and TIA (transient ischemic attack). here with:  1.  History of seizures, not on medication 2.  History of cerebrovascular disease 3.  Memory disturbance 4. Depression   -Doing overall well today, other than low mood, restarted on Bupropion  -EEG was normal June 2022 -B12, RPR, sed rate was unremarkable -MRI of the brain showed right frontal encephalomalacia, small vessel disease that appears chronic, minimal progression from MRI of the brain in 2013 -Continue Namenda, 10 mg twice daily, will hold Aricept given seizure history -MMSE 27/30, was 27/30 last visit, feels memory better with Namenda -Follow-up in 6 months or sooner if needed, can follow up with Judson Roch    I have spent a total of 40 minutes dedicated to this patient today, preparing to see patient, performing a medically appropriate examination and evaluation, ordering tests and/or medications and procedures, and counseling and educating the patient/family/caregiver; independently interpreting result and communicating results to the family/patient/caregiver; and documenting clinical information in the electronic medical record.   Alric Ran, MD  11/05/2021, 10:46 AM [facility] [address] [place], [place] [address] [phone]

## 2021-11-11 DIAGNOSIS — H524 Presbyopia: Secondary | ICD-10-CM | POA: Diagnosis not present

## 2021-11-11 DIAGNOSIS — H52203 Unspecified astigmatism, bilateral: Secondary | ICD-10-CM | POA: Diagnosis not present

## 2021-11-11 DIAGNOSIS — H50111 Monocular exotropia, right eye: Secondary | ICD-10-CM | POA: Diagnosis not present

## 2021-11-11 DIAGNOSIS — Z961 Presence of intraocular lens: Secondary | ICD-10-CM | POA: Diagnosis not present

## 2021-11-11 DIAGNOSIS — H5203 Hypermetropia, bilateral: Secondary | ICD-10-CM | POA: Diagnosis not present

## 2021-11-13 DIAGNOSIS — M7989 Other specified soft tissue disorders: Secondary | ICD-10-CM | POA: Diagnosis not present

## 2021-11-13 DIAGNOSIS — M25472 Effusion, left ankle: Secondary | ICD-10-CM | POA: Insufficient documentation

## 2021-11-13 DIAGNOSIS — M25475 Effusion, left foot: Secondary | ICD-10-CM | POA: Insufficient documentation

## 2021-11-27 DIAGNOSIS — Z7901 Long term (current) use of anticoagulants: Secondary | ICD-10-CM | POA: Diagnosis not present

## 2021-11-27 DIAGNOSIS — M25561 Pain in right knee: Secondary | ICD-10-CM | POA: Diagnosis not present

## 2021-11-27 DIAGNOSIS — F03B3 Unspecified dementia, moderate, with mood disturbance: Secondary | ICD-10-CM | POA: Diagnosis not present

## 2021-11-27 DIAGNOSIS — M545 Low back pain, unspecified: Secondary | ICD-10-CM | POA: Diagnosis not present

## 2021-11-27 DIAGNOSIS — F331 Major depressive disorder, recurrent, moderate: Secondary | ICD-10-CM | POA: Diagnosis not present

## 2021-11-27 DIAGNOSIS — M25551 Pain in right hip: Secondary | ICD-10-CM | POA: Diagnosis not present

## 2021-11-27 DIAGNOSIS — I480 Paroxysmal atrial fibrillation: Secondary | ICD-10-CM | POA: Diagnosis not present

## 2021-11-27 DIAGNOSIS — M7989 Other specified soft tissue disorders: Secondary | ICD-10-CM | POA: Diagnosis not present

## 2021-11-29 DIAGNOSIS — I5032 Chronic diastolic (congestive) heart failure: Secondary | ICD-10-CM | POA: Diagnosis not present

## 2021-11-29 DIAGNOSIS — Z6828 Body mass index (BMI) 28.0-28.9, adult: Secondary | ICD-10-CM | POA: Diagnosis not present

## 2021-11-29 DIAGNOSIS — M545 Low back pain, unspecified: Secondary | ICD-10-CM | POA: Diagnosis not present

## 2021-11-29 DIAGNOSIS — Z515 Encounter for palliative care: Secondary | ICD-10-CM | POA: Diagnosis not present

## 2021-11-29 DIAGNOSIS — M25551 Pain in right hip: Secondary | ICD-10-CM | POA: Diagnosis not present

## 2021-11-29 DIAGNOSIS — Z7901 Long term (current) use of anticoagulants: Secondary | ICD-10-CM | POA: Diagnosis not present

## 2021-11-29 DIAGNOSIS — D692 Other nonthrombocytopenic purpura: Secondary | ICD-10-CM | POA: Diagnosis not present

## 2021-12-02 DIAGNOSIS — F03B3 Unspecified dementia, moderate, with mood disturbance: Secondary | ICD-10-CM | POA: Diagnosis not present

## 2021-12-02 DIAGNOSIS — J4540 Moderate persistent asthma, uncomplicated: Secondary | ICD-10-CM | POA: Insufficient documentation

## 2021-12-02 DIAGNOSIS — F331 Major depressive disorder, recurrent, moderate: Secondary | ICD-10-CM | POA: Diagnosis not present

## 2021-12-02 DIAGNOSIS — I480 Paroxysmal atrial fibrillation: Secondary | ICD-10-CM | POA: Diagnosis not present

## 2021-12-02 DIAGNOSIS — M25561 Pain in right knee: Secondary | ICD-10-CM | POA: Diagnosis not present

## 2021-12-02 DIAGNOSIS — M7989 Other specified soft tissue disorders: Secondary | ICD-10-CM | POA: Diagnosis not present

## 2021-12-02 DIAGNOSIS — Z7901 Long term (current) use of anticoagulants: Secondary | ICD-10-CM | POA: Diagnosis not present

## 2021-12-02 DIAGNOSIS — M25551 Pain in right hip: Secondary | ICD-10-CM | POA: Diagnosis not present

## 2021-12-02 DIAGNOSIS — M545 Low back pain, unspecified: Secondary | ICD-10-CM | POA: Diagnosis not present

## 2021-12-03 ENCOUNTER — Telehealth: Payer: Self-pay | Admitting: Cardiology

## 2021-12-03 MED ORDER — FLECAINIDE ACETATE 50 MG PO TABS: 50.0000 mg | ORAL_TABLET | Freq: Two times a day (BID) | ORAL | 3 refills | Status: DC

## 2021-12-03 NOTE — Telephone Encounter (Signed)
 *  STAT* If patient is at the pharmacy, call can be transferred to refill team.   1. Which medications need to be refilled? (please list name of each medication and dose if known) flecainide (TAMBOCOR) 50 MG tablet  2. Which pharmacy/location (including street and city if local pharmacy) is medication to be sent to? [facility]  3. Do they need a 30 day or 90 day supply? 90 days

## 2021-12-03 NOTE — Telephone Encounter (Signed)
Rx sent to pharmacy, patient notified.

## 2021-12-05 MED ORDER — FLECAINIDE ACETATE 50 MG PO TABS: 50.0000 mg | ORAL_TABLET | Freq: Two times a day (BID) | ORAL | 3 refills | Status: DC

## 2021-12-05 NOTE — Addendum Note (Signed)
Addended by: Merlene Laughter on: 12/05/2021 04:52 PM   Modules accepted: Orders

## 2021-12-05 NOTE — Telephone Encounter (Signed)
Pt c/o medication issue:  1. Name of Medication: flecainide (TAMBOCOR) 50 MG tablet  2. How are you currently taking this medication (dosage and times per day)? As prescribed   3. Are you having a reaction (difficulty breathing--STAT)? No  4. What is your medication issue? This medication was sent to the wrong pharmacy.   Pharmacy it needs to be sent to:  [facility] - [place], [place] - [address]  90 day supply

## 2021-12-23 DIAGNOSIS — E782 Mixed hyperlipidemia: Secondary | ICD-10-CM | POA: Diagnosis not present

## 2021-12-23 DIAGNOSIS — N1831 Chronic kidney disease, stage 3a: Secondary | ICD-10-CM | POA: Diagnosis not present

## 2021-12-23 DIAGNOSIS — I5032 Chronic diastolic (congestive) heart failure: Secondary | ICD-10-CM | POA: Diagnosis not present

## 2021-12-23 DIAGNOSIS — M25552 Pain in left hip: Secondary | ICD-10-CM | POA: Insufficient documentation

## 2021-12-23 DIAGNOSIS — I130 Hypertensive heart and chronic kidney disease with heart failure and stage 1 through stage 4 chronic kidney disease, or unspecified chronic kidney disease: Secondary | ICD-10-CM | POA: Diagnosis not present

## 2021-12-24 DIAGNOSIS — N1831 Chronic kidney disease, stage 3a: Secondary | ICD-10-CM | POA: Diagnosis not present

## 2021-12-24 DIAGNOSIS — Z6831 Body mass index (BMI) 31.0-31.9, adult: Secondary | ICD-10-CM | POA: Diagnosis not present

## 2021-12-24 DIAGNOSIS — R062 Wheezing: Secondary | ICD-10-CM | POA: Diagnosis not present

## 2021-12-24 DIAGNOSIS — R600 Localized edema: Secondary | ICD-10-CM | POA: Diagnosis not present

## 2021-12-24 DIAGNOSIS — I5032 Chronic diastolic (congestive) heart failure: Secondary | ICD-10-CM | POA: Diagnosis not present

## 2021-12-24 DIAGNOSIS — E669 Obesity, unspecified: Secondary | ICD-10-CM | POA: Diagnosis not present

## 2021-12-27 ENCOUNTER — Other Ambulatory Visit: Payer: Self-pay | Admitting: Orthopedic Surgery

## 2021-12-27 ENCOUNTER — Other Ambulatory Visit (HOSPITAL_COMMUNITY): Payer: Self-pay | Admitting: Orthopedic Surgery

## 2021-12-27 DIAGNOSIS — M25551 Pain in right hip: Secondary | ICD-10-CM | POA: Diagnosis not present

## 2021-12-27 DIAGNOSIS — M25552 Pain in left hip: Secondary | ICD-10-CM | POA: Diagnosis not present

## 2022-01-07 ENCOUNTER — Encounter (HOSPITAL_COMMUNITY)
Admission: RE | Admit: 2022-01-07 | Discharge: 2022-01-07 | Disposition: A | Discharge status: Home / Self Care | Payer: PPO | Source: Ambulatory Visit | Attending: Internal Medicine | Admitting: Internal Medicine

## 2022-01-07 ENCOUNTER — Other Ambulatory Visit (HOSPITAL_COMMUNITY): Payer: Self-pay

## 2022-01-07 ENCOUNTER — Encounter (HOSPITAL_COMMUNITY): Payer: PPO

## 2022-01-07 DIAGNOSIS — M810 Age-related osteoporosis without current pathological fracture: Secondary | ICD-10-CM | POA: Insufficient documentation

## 2022-01-07 MED ORDER — DENOSUMAB 60 MG/ML ~~LOC~~ SOSY
60.0000 mg | PREFILLED_SYRINGE | Freq: Once | SUBCUTANEOUS | Status: AC
  Administered 2022-01-07: 60 mg via SUBCUTANEOUS

## 2022-01-07 NOTE — Progress Notes (Signed)
Diagnosis: Osteoporosis  Provider:  Zack Hall MD  Procedure: Injection  Prolia (Denosumab), Dose: 60 mg, Site: subcutaneous, Number of injections: 1  Discharge: Condition: Good, Destination: Home . AVS provided to patient.   Performed by:  Taylor Spilde H, RN        

## 2022-01-10 ENCOUNTER — Ambulatory Visit (HOSPITAL_COMMUNITY)
Admission: RE | Admit: 2022-01-10 | Discharge: 2022-01-10 | Disposition: A | Discharge status: Home / Self Care | Payer: PPO | Source: Ambulatory Visit | Attending: Orthopedic Surgery | Admitting: Orthopedic Surgery

## 2022-01-10 DIAGNOSIS — M25551 Pain in right hip: Secondary | ICD-10-CM | POA: Diagnosis not present

## 2022-01-10 DIAGNOSIS — M24151 Other articular cartilage disorders, right hip: Secondary | ICD-10-CM | POA: Diagnosis not present

## 2022-01-10 DIAGNOSIS — S73191A Other sprain of right hip, initial encounter: Secondary | ICD-10-CM | POA: Diagnosis not present

## 2022-01-10 DIAGNOSIS — M24851 Other specific joint derangements of right hip, not elsewhere classified: Secondary | ICD-10-CM | POA: Diagnosis not present

## 2022-01-13 ENCOUNTER — Other Ambulatory Visit (HOSPITAL_COMMUNITY): Payer: Self-pay | Admitting: Internal Medicine

## 2022-01-13 DIAGNOSIS — I5032 Chronic diastolic (congestive) heart failure: Secondary | ICD-10-CM | POA: Diagnosis not present

## 2022-01-13 DIAGNOSIS — N1831 Chronic kidney disease, stage 3a: Secondary | ICD-10-CM | POA: Diagnosis not present

## 2022-01-13 DIAGNOSIS — R109 Unspecified abdominal pain: Secondary | ICD-10-CM | POA: Diagnosis not present

## 2022-01-13 DIAGNOSIS — Z1231 Encounter for screening mammogram for malignant neoplasm of breast: Secondary | ICD-10-CM

## 2022-01-13 DIAGNOSIS — R062 Wheezing: Secondary | ICD-10-CM | POA: Diagnosis not present

## 2022-01-13 DIAGNOSIS — Z0001 Encounter for general adult medical examination with abnormal findings: Secondary | ICD-10-CM | POA: Diagnosis not present

## 2022-01-13 DIAGNOSIS — R600 Localized edema: Secondary | ICD-10-CM | POA: Diagnosis not present

## 2022-01-15 DIAGNOSIS — H6121 Impacted cerumen, right ear: Secondary | ICD-10-CM | POA: Diagnosis not present

## 2022-01-15 DIAGNOSIS — H6122 Impacted cerumen, left ear: Secondary | ICD-10-CM | POA: Diagnosis not present

## 2022-01-15 DIAGNOSIS — H6123 Impacted cerumen, bilateral: Secondary | ICD-10-CM | POA: Diagnosis not present

## 2022-01-16 ENCOUNTER — Encounter (INDEPENDENT_AMBULATORY_CARE_PROVIDER_SITE_OTHER): Payer: Self-pay | Admitting: *Deleted

## 2022-01-16 ENCOUNTER — Other Ambulatory Visit: Payer: Self-pay

## 2022-01-16 ENCOUNTER — Encounter: Payer: Self-pay | Admitting: *Deleted

## 2022-01-16 ENCOUNTER — Telehealth: Payer: Self-pay

## 2022-01-16 DIAGNOSIS — I5032 Chronic diastolic (congestive) heart failure: Secondary | ICD-10-CM

## 2022-01-16 DIAGNOSIS — I10 Essential (primary) hypertension: Secondary | ICD-10-CM

## 2022-01-16 NOTE — Chronic Care Management (AMB) (Signed)
  Care Coordination  Note  01/16/2022 Name: [name] MRN: [id number] DOB: [birth year]  [name] is a 86 y.o. year old female who is a primary care patient of [place] Crane, Edwinna Areola, MD. I reached out to Carman Ching by phone today to offer care coordination services.      [name] was given information about Care Coordination services today including:  The Care Coordination services include support from the care team which includes your Nurse Coordinator, Clinical Social Worker, or Pharmacist.  The Care Coordination team is here to help remove barriers to the health concerns and goals most important to you. Care Coordination services are voluntary and the patient may decline or stop services at any time by request to their care team member.   Patient agreed to services and verbal consent obtained.   Follow up plan: Telephone appointment with care coordination team member scheduled for:02/06/2022  Noreene Larsson, [place], [place] [address] Direct Dial: [phone] [email or web address]

## 2022-01-17 DIAGNOSIS — I5032 Chronic diastolic (congestive) heart failure: Secondary | ICD-10-CM | POA: Diagnosis not present

## 2022-01-17 DIAGNOSIS — Z66 Do not resuscitate: Secondary | ICD-10-CM | POA: Diagnosis not present

## 2022-01-17 DIAGNOSIS — F339 Major depressive disorder, recurrent, unspecified: Secondary | ICD-10-CM | POA: Diagnosis not present

## 2022-01-17 DIAGNOSIS — Z515 Encounter for palliative care: Secondary | ICD-10-CM | POA: Diagnosis not present

## 2022-01-21 DIAGNOSIS — M5136 Other intervertebral disc degeneration, lumbar region: Secondary | ICD-10-CM | POA: Diagnosis not present

## 2022-01-21 DIAGNOSIS — M4180 Other forms of scoliosis, site unspecified: Secondary | ICD-10-CM | POA: Diagnosis not present

## 2022-01-21 DIAGNOSIS — M5451 Vertebrogenic low back pain: Secondary | ICD-10-CM | POA: Diagnosis not present

## 2022-01-23 DIAGNOSIS — I5032 Chronic diastolic (congestive) heart failure: Secondary | ICD-10-CM | POA: Diagnosis not present

## 2022-01-23 DIAGNOSIS — I130 Hypertensive heart and chronic kidney disease with heart failure and stage 1 through stage 4 chronic kidney disease, or unspecified chronic kidney disease: Secondary | ICD-10-CM | POA: Diagnosis not present

## 2022-01-23 DIAGNOSIS — N1831 Chronic kidney disease, stage 3a: Secondary | ICD-10-CM | POA: Diagnosis not present

## 2022-01-23 DIAGNOSIS — E782 Mixed hyperlipidemia: Secondary | ICD-10-CM | POA: Diagnosis not present

## 2022-01-24 ENCOUNTER — Ambulatory Visit (HOSPITAL_COMMUNITY)
Admission: RE | Admit: 2022-01-24 | Discharge: 2022-01-24 | Disposition: A | Discharge status: Home / Self Care | Payer: PPO | Source: Ambulatory Visit | Attending: Internal Medicine | Admitting: Internal Medicine

## 2022-01-24 ENCOUNTER — Encounter (HOSPITAL_COMMUNITY): Payer: Self-pay

## 2022-01-24 DIAGNOSIS — Z1231 Encounter for screening mammogram for malignant neoplasm of breast: Secondary | ICD-10-CM

## 2022-01-30 ENCOUNTER — Encounter: Payer: Self-pay | Admitting: Internal Medicine

## 2022-02-06 ENCOUNTER — Encounter: Payer: Self-pay | Admitting: *Deleted

## 2022-02-06 ENCOUNTER — Ambulatory Visit: Payer: Self-pay | Admitting: *Deleted

## 2022-02-06 NOTE — Patient Outreach (Signed)
  Care Coordination   Initial Visit Note   02/06/2022  Name: TANYIA GRABBE MRN: [id number] DOB: 1936-05-11  HAWRAA STAMBAUGH is a 86 y.o. year old female who sees [place] Crane, Edwinna Areola, MD for primary care. I spoke with Carman Ching by phone today.  What matters to the patients health and wellness today?  Receive Assistance with Completion of Advanced Directives ([place]).    Goals Addressed               This Visit's Progress     Receive Assistance with Completion of Advanced Directives ([place]). (pt-stated)   On track     Care Coordination Interventions:   Provided Education to Patient About Advanced Directives (Living Will and [place]). CSW Agreed to Assist Patient with Initiation and Completion of Advanced Directives (Living Will and [place]). Mailed Patient an Garment/textile technologist ([place]), on 02/06/2022.  Please Review Garment/textile technologist (Living Will and Healthcare Power of Attorney Documents), and Be Prepared to Complete with CSW During Next Scheduled Telephone [employer]. Discussed Plans with Patient for Ongoing Care Management Follow Up. Provided Patient with Direct Contact Information for Care Management Team. Screening for Signs and Symptoms of Depression Related to Chronic Disease State.  Assessed Social Determinant of Health Barriers. PHQ2/PHQ9 Depression Screening Tool Completed, and Results Reviewed with Patient. Solution-Focused Strategies Employed.         SDOH assessments and interventions completed:  Yes.  SDOH Interventions Today    Flowsheet Row Most Recent Value  SDOH Interventions   Food Insecurity Interventions Intervention Not Indicated  Housing Interventions Intervention Not Indicated  Transportation Interventions  Intervention Not Indicated  Utilities Interventions Intervention Not Indicated  Alcohol Usage Interventions Intervention Not Indicated (Score <7)  Financial Strain Interventions Intervention Not Indicated  Physical Activity Interventions Patient Refused  Stress Interventions Intervention Not Indicated  Social Connections Interventions Intervention Not Indicated        Care Coordination Interventions Activated:  Yes.   Care Coordination Interventions:  Yes, provided.   Follow up plan: Follow up call scheduled for 02/20/2022 at 10:30 am.  Encounter Outcome:  Pt. Visit Completed.   Nat Christen, BSW, MSW, LCSW  Licensed Education officer, [facility]  Mailing [place] [address], [place], [place] [address] Physical Address[address], [place], [place] [address] Toll Free Main # [phone] Fax # [phone] Cell # [phone] [email or web address]

## 2022-02-06 NOTE — Patient Instructions (Signed)
Visit Information  Thank you for taking time to visit with me today. Please don't hesitate to contact me if I can be of assistance to you.   Following are the goals we discussed today:   Goals Addressed               This Visit's Progress     Receive Assistance with Completion of Advanced Directives (Living Will and [place]). (pt-stated)   On track     Care Coordination Interventions:   Provided Education to Patient About Advanced Directives (Living Will and [place]). CSW Agreed to Assist Patient with Initiation and Completion of Advanced Directives (Living Will and [place]). Mailed Patient an Garment/textile technologist ([place]), on 02/06/2022.  Please Review Garment/textile technologist (Living Will and Healthcare Power of Attorney Documents), and Be Prepared to Complete with CSW During Next Scheduled Telephone [employer]. Discussed Plans with Patient for Ongoing Care Management Follow Up. Provided Patient with Direct Contact Information for Care Management Team. Screening for Signs and Symptoms of Depression Related to Chronic Disease State.  Assessed Social Determinant of Health Barriers. PHQ2/PHQ9 Depression Screening Tool Completed, and Results Reviewed with Patient. Solution-Focused Strategies Employed.         Our next appointment is by telephone on 02/20/2022 at 10:30 am.  Please call the care guide team at [phone] if you need to cancel or reschedule your appointment.   If you are experiencing a Mental Health or [place] or need someone to talk to, please call the Suicide and Crisis Lifeline: 988 call the [place] National Suicide Prevention Lifeline: 236 514 1910 or TTY: 551-537-3025 TTY (260)849-9814) to talk to a trained counselor call 1-800-273-TALK (toll free, 24 hour hotline) go to [facility] [address], [place] [phone]) call the [place]: [phone] call 911  Patient verbalizes understanding of instructions and care plan provided today and agrees to view in [place]. Active MyChart status and patient understanding of how to access instructions and care plan via MyChart confirmed with patient.     Telephone follow up appointment with care management team member scheduled for:  02/20/2022 at 10:30 am.  Nat Christen, BSW, MSW, [place]  Licensed Clinical Social Worker  [place]  Mailing [place]. [address] [place], [place] [address] Physical Address[address], [place], [place] [address] Toll Free Main # [phone] Fax # [phone] Cell # [phone] [email or web address]

## 2022-02-07 ENCOUNTER — Other Ambulatory Visit: Payer: Self-pay | Admitting: Internal Medicine

## 2022-02-07 DIAGNOSIS — M2569 Stiffness of other specified joint, not elsewhere classified: Secondary | ICD-10-CM | POA: Diagnosis not present

## 2022-02-07 DIAGNOSIS — M62551 Muscle wasting and atrophy, not elsewhere classified, right thigh: Secondary | ICD-10-CM | POA: Diagnosis not present

## 2022-02-07 DIAGNOSIS — M62561 Muscle wasting and atrophy, not elsewhere classified, right lower leg: Secondary | ICD-10-CM | POA: Diagnosis not present

## 2022-02-07 DIAGNOSIS — M545 Low back pain, unspecified: Secondary | ICD-10-CM | POA: Diagnosis not present

## 2022-02-11 ENCOUNTER — Other Ambulatory Visit: Payer: Self-pay | Admitting: *Deleted

## 2022-02-11 DIAGNOSIS — M2569 Stiffness of other specified joint, not elsewhere classified: Secondary | ICD-10-CM | POA: Diagnosis not present

## 2022-02-11 DIAGNOSIS — M545 Low back pain, unspecified: Secondary | ICD-10-CM | POA: Diagnosis not present

## 2022-02-11 DIAGNOSIS — M62561 Muscle wasting and atrophy, not elsewhere classified, right lower leg: Secondary | ICD-10-CM | POA: Diagnosis not present

## 2022-02-11 DIAGNOSIS — M62551 Muscle wasting and atrophy, not elsewhere classified, right thigh: Secondary | ICD-10-CM | POA: Diagnosis not present

## 2022-02-11 MED ORDER — FLECAINIDE ACETATE 50 MG PO TABS: ORAL_TABLET | ORAL | 3 refills | Status: DC

## 2022-02-17 DIAGNOSIS — M545 Low back pain, unspecified: Secondary | ICD-10-CM | POA: Diagnosis not present

## 2022-02-17 DIAGNOSIS — M2569 Stiffness of other specified joint, not elsewhere classified: Secondary | ICD-10-CM | POA: Diagnosis not present

## 2022-02-17 DIAGNOSIS — M62561 Muscle wasting and atrophy, not elsewhere classified, right lower leg: Secondary | ICD-10-CM | POA: Diagnosis not present

## 2022-02-17 DIAGNOSIS — M62551 Muscle wasting and atrophy, not elsewhere classified, right thigh: Secondary | ICD-10-CM | POA: Diagnosis not present

## 2022-02-19 ENCOUNTER — Ambulatory Visit: Payer: PPO | Admitting: Internal Medicine

## 2022-02-20 ENCOUNTER — Ambulatory Visit: Payer: Self-pay | Admitting: *Deleted

## 2022-02-20 ENCOUNTER — Encounter: Payer: Self-pay | Admitting: *Deleted

## 2022-02-20 NOTE — Patient Instructions (Signed)
Visit Information  Thank you for taking time to visit with me today. Please don't hesitate to contact me if I can be of assistance to you.   Following are the goals we discussed today:   Goals Addressed               This Visit's Progress     Receive Assistance with Completion of Advanced Directives (Living Will and [place]). (pt-stated)   On track     Care Coordination Interventions:   Mailed Patient an Garment/textile technologist (Living Will and [place]), on 02/06/2022.   ~ Patient Denied Receiving Documents.  ~ CSW Mailed Documents (for a Second Time) on 02/20/2022. Please Review Garment/textile technologist (Living Will and Healthcare Power of Attorney Documents), and Be Prepared to Complete with CSW During Next Scheduled Telephone [employer]. Solution-Focused Strategies Employed. Active Listening/Reflection Utilized. Emotional Support Provided.        Our next appointment is by telephone on 03/13/2022 at 9:30 am.  Please call the care guide team at [phone] if you need to cancel or reschedule your appointment.   If you are experiencing a Mental Health or [place] or need someone to talk to, please call the Suicide and Crisis Lifeline: 988 call the [place] National Suicide Prevention Lifeline: 228-794-2253 or TTY: 2566006729 TTY 803 594 4258) to talk to a trained counselor call 1-800-273-TALK (toll free, 24 hour hotline) go to [facility] [address] [place] [phone]) call the [place]: [phone] call 911  Patient verbalizes understanding of instructions and care plan provided today and agrees to view in [place]. Active MyChart status and patient understanding of how to access instructions and care plan via MyChart confirmed with patient.     Telephone follow up appointment with care management team member scheduled  for:  03/13/2022 at 9:30 am.  Nat Christen, BSW, MSW, [place]  Licensed Clinical Social Worker  [place]  Mailing [place]. [address], [place], [place] [address] Physical Address-[address], [place], [place] [address] Toll Free Main # [phone] Fax # [phone] Cell # [phone] [name]

## 2022-02-20 NOTE — Patient Outreach (Signed)
  Care Coordination   Follow Up Visit Note   02/20/2022  Name: [name] MRN: [id number] DOB: 1935-06-11  [name] is a 86 y.o. year old female who sees Nevada Crane, Edwinna Areola, MD for primary care. I spoke with Carman Ching by phone today.  What matters to the patients health and wellness today?  Receive Assistance with Completion of Advanced Directives ([place]).    Goals Addressed               This Visit's Progress     Receive Assistance with Completion of Advanced Directives ([place]). (pt-stated)   On track     Care Coordination Interventions:   Mailed Patient an Garment/textile technologist (Living Will and [place]), on 02/06/2022.   ~ Patient Denied Receiving Documents.  ~ CSW Mailed Documents (for a Second Time) on 02/20/2022. Please Review Garment/textile technologist (Living Will and Healthcare Power of Attorney Documents), and Be Prepared to Complete with CSW During Next Scheduled Telephone [employer]. Solution-Focused Strategies Employed. Active Listening/Reflection Utilized. Emotional Support Provided.        SDOH assessments and interventions completed:  Yes.  Care Coordination Interventions Activated:  Yes.   Care Coordination Interventions:  Yes, provided.   Follow up plan: Follow up call scheduled for 03/13/2022 at 9:30 am.  Encounter Outcome:  Pt. Visit Completed.   Nat Christen, BSW, MSW, LCSW  Licensed Education officer, [facility]  Mailing [place] [address] [place], [place] [address] Physical Address-[address] [place], [place] [address] Toll Free Main # [phone] Fax # [phone] Cell # [phone] [email or web address]

## 2022-02-22 DIAGNOSIS — E782 Mixed hyperlipidemia: Secondary | ICD-10-CM | POA: Diagnosis not present

## 2022-02-22 DIAGNOSIS — I5032 Chronic diastolic (congestive) heart failure: Secondary | ICD-10-CM | POA: Diagnosis not present

## 2022-02-22 DIAGNOSIS — N1831 Chronic kidney disease, stage 3a: Secondary | ICD-10-CM | POA: Diagnosis not present

## 2022-02-22 DIAGNOSIS — I130 Hypertensive heart and chronic kidney disease with heart failure and stage 1 through stage 4 chronic kidney disease, or unspecified chronic kidney disease: Secondary | ICD-10-CM | POA: Diagnosis not present

## 2022-02-24 DIAGNOSIS — M545 Low back pain, unspecified: Secondary | ICD-10-CM | POA: Diagnosis not present

## 2022-02-24 DIAGNOSIS — E039 Hypothyroidism, unspecified: Secondary | ICD-10-CM | POA: Diagnosis not present

## 2022-02-24 DIAGNOSIS — R600 Localized edema: Secondary | ICD-10-CM | POA: Diagnosis not present

## 2022-02-24 DIAGNOSIS — F03B3 Unspecified dementia, moderate, with mood disturbance: Secondary | ICD-10-CM | POA: Diagnosis not present

## 2022-02-24 DIAGNOSIS — M25551 Pain in right hip: Secondary | ICD-10-CM | POA: Diagnosis not present

## 2022-02-24 DIAGNOSIS — Z23 Encounter for immunization: Secondary | ICD-10-CM | POA: Diagnosis not present

## 2022-02-24 DIAGNOSIS — I5032 Chronic diastolic (congestive) heart failure: Secondary | ICD-10-CM | POA: Diagnosis not present

## 2022-02-24 DIAGNOSIS — I10 Essential (primary) hypertension: Secondary | ICD-10-CM | POA: Diagnosis not present

## 2022-02-24 DIAGNOSIS — R109 Unspecified abdominal pain: Secondary | ICD-10-CM | POA: Diagnosis not present

## 2022-02-24 DIAGNOSIS — R062 Wheezing: Secondary | ICD-10-CM | POA: Diagnosis not present

## 2022-02-24 DIAGNOSIS — N1831 Chronic kidney disease, stage 3a: Secondary | ICD-10-CM | POA: Diagnosis not present

## 2022-02-25 DIAGNOSIS — M62551 Muscle wasting and atrophy, not elsewhere classified, right thigh: Secondary | ICD-10-CM | POA: Diagnosis not present

## 2022-02-25 DIAGNOSIS — M545 Low back pain, unspecified: Secondary | ICD-10-CM | POA: Diagnosis not present

## 2022-02-25 DIAGNOSIS — M62561 Muscle wasting and atrophy, not elsewhere classified, right lower leg: Secondary | ICD-10-CM | POA: Diagnosis not present

## 2022-02-25 DIAGNOSIS — M2569 Stiffness of other specified joint, not elsewhere classified: Secondary | ICD-10-CM | POA: Diagnosis not present

## 2022-02-27 ENCOUNTER — Ambulatory Visit: Payer: PPO | Admitting: Internal Medicine

## 2022-02-28 ENCOUNTER — Encounter: Payer: Self-pay | Admitting: Cardiology

## 2022-02-28 ENCOUNTER — Encounter: Payer: PPO | Attending: Internal Medicine | Admitting: Cardiology

## 2022-02-28 VITALS — BP 124/84 | HR 57 | Ht 60.0 in | Wt 139.0 lb

## 2022-02-28 DIAGNOSIS — Z79899 Other long term (current) drug therapy: Secondary | ICD-10-CM | POA: Diagnosis not present

## 2022-02-28 DIAGNOSIS — R0602 Shortness of breath: Secondary | ICD-10-CM | POA: Diagnosis not present

## 2022-02-28 DIAGNOSIS — I480 Paroxysmal atrial fibrillation: Secondary | ICD-10-CM | POA: Diagnosis not present

## 2022-02-28 DIAGNOSIS — I10 Essential (primary) hypertension: Secondary | ICD-10-CM | POA: Diagnosis not present

## 2022-02-28 DIAGNOSIS — I5032 Chronic diastolic (congestive) heart failure: Secondary | ICD-10-CM | POA: Insufficient documentation

## 2022-02-28 MED ORDER — FUROSEMIDE 40 MG PO TABS: 60.0000 mg | ORAL_TABLET | Freq: Every day | ORAL | 6 refills | Status: DC

## 2022-02-28 NOTE — Patient Instructions (Addendum)
Medication Instructions:  Increase Lasix to '60mg'$  daily, may take an additional '20mg'$  as needed for swelling  Continue all current medications.  Labwork: BMET, Mg, BNP - orders given today  Please do in 2 weeks, around 03/14/2022  Testing/Procedures: Your physician has requested that you have an echocardiogram. Echocardiography is a painless test that uses sound waves to create images of your heart. It provides your doctor with information about the size and shape of your heart and how well your heart's chambers and valves are working. This procedure takes approximately one hour. There are no restrictions for this procedure.  Follow-Up: Office will contact with results via phone, letter or mychart.    4 months   Any Other Special Instructions Will Be Listed Below (If Applicable).   If you need a refill on your cardiac medications before your next appointment, please call your pharmacy.

## 2022-02-28 NOTE — Progress Notes (Signed)
Clinical Summary [name] is a 86 y.o.female seen today for follow up of the following medical problems.      1. Palpitations/ paroxysmal afib - seen in ER 01/29/21 with abdominal pain, palpitations. Awoke her from sleep.  - found to be in afib, new diagnosis for her. Self converted in ER - already on coumadin - 11/2018 echo LVEF 60-65%, mild LAE, mild MR -has been on lopressor '25mg'$  bid for a number of years   07/2021 ER visit with chest pain, palpitatins - EMS eval HRs 180s, found to be in afib with RVR - given IV diltiazem '15mg'$  and then drip, converted to SR - trop up to 160 thought to be rate related. 10/2020 nuclear stress no ischemia   - in general 2-3 times per week, usually 5 minutes - episode in March was more severe than normal episodes. +SOB  - seen by EP, started on flecandie 08/2021  - occasional palpitations, lasts just a few minutes - symptosm better with flecanide.    2. CVA/TIAs - reports this is why she is coumadin, INRs followed by pcp.  - INRs followed by Dr Nevada Crane   3. Leg edema - recent leg swelling over summertime.  - some recent SOB though difficult for her to assess given her asthma - chronically sleeps at angle due to prior back injury - compliant with lasix '40mg'$  daily, will take additioanl '20mg'$  as needed     4. Chest tightness  10/2020 nuclear stress no ischemia Past Medical History:  Diagnosis Date   Adenomatous colon polyp 2008   Anxiety    Asthma    has not needed in over a year   Complication of anesthesia    pt reports TIA and sezures after surgery   Coronary artery disease    CVA (cerebral vascular accident) ([place])    x 3   Degenerative disc disease, lumbar    Depression    Diverticulosis    GERD (gastroesophageal reflux disease)    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54   age 43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 2 weeks   Hemorrhoids 2007   History of kidney stones    HTN (hypertension)    Hyperplastic colon polyp 2005   Hypothyroidism    Narcolepsy     PONV (postoperative nausea and vomiting)    TIA (transient ischemic attack)      Allergies  Allergen Reactions   Epinephrine Anaphylaxis   Hydrocodone-Acetaminophen    Demerol [Meperidine] Other (See Comments)    headaches   Vicodin [Hydrocodone-Acetaminophen] Other (See Comments)    Said it makes her crazy. Pt tolerates acetaminophen     Current Outpatient Medications  Medication Sig Dispense Refill   albuterol (PROVENTIL) (2.5 MG/3ML) 0.083% nebulizer solution Take 3 mLs (2.5 mg total) by nebulization every 6 (six) hours as needed for wheezing or shortness of breath. 75 mL 0   albuterol (VENTOLIN HFA) 108 (90 Base) MCG/ACT inhaler Inhale 1-2 puffs into the lungs every 6 (six) hours as needed for wheezing or shortness of breath.     amLODipine (NORVASC) 5 MG tablet Take 1 tablet (5 mg total) by mouth daily. 30 tablet 0   Ascorbic Acid (VITAMIN C) 1000 MG tablet Take 1,000 mg by mouth daily.     B Complex Vitamins (VITAMIN B COMPLEX) TABS Take 1 tablet by mouth daily.     Biotin 300 MCG TABS Take 300 mcg by mouth daily after supper.      bu[facility]  XL) 150 MG 24 hr tablet Take 150 mg by mouth every morning.     Calcium Carb-Cholecalciferol (CALCIUM 600 + D PO) Take 1 tablet by mouth in the morning and at bedtime.     denosumab (PROLIA) 60 MG/ML SOSY injection Inject 60 mg into the skin every 6 (six) months.     ELIQUIS 5 MG TABS tablet Take 5 mg by mouth 2 (two) times daily.     ezetimibe (ZETIA) 10 MG tablet Take 1 tablet (10 mg total) by mouth daily. 30 tablet 0   famotidine (PEPCID) 20 MG tablet Take 20 mg by mouth 2 (two) times daily.     Ferrous Gluconate (IRON) 240 (27 Fe) MG TABS Take 1 tablet by mouth daily. 30 tablet 0   Flaxseed, Linseed, (FLAXSEED OIL) 1000 MG CAPS Take 1,000 mg by mouth daily.      flecainide (TAMBOCOR) 50 MG tablet TAKE 1 TABLET(50 MG) BY MOUTH TWICE DAILY 180 tablet 3   furosemide (LASIX) 40 MG tablet Take 1 tablet (40 mg total) by mouth  daily. (May take an additional '20mg'$  as needed for swelling.) 45 tablet 6   Lecithin 1200 MG CAPS Take 1,200 mg by mouth daily.     levothyroxine (SYNTHROID) 75 MCG tablet Take 75 mcg by mouth daily.     Lutein 40 MG CAPS Take 40 mg by mouth daily.      Magnesium 250 MG TABS Take 250 mg by mouth daily.      memantine (NAMENDA) 5 MG tablet TAKE 2 TABLETS BY MOUTH TWICE DAILY 120 tablet 2   metoprolol tartrate (LOPRESSOR) 25 MG tablet Take 1 tablet (25 mg total) by mouth 2 (two) times daily. (MAY TAKE AN ADDITIONAL '25MG'$  AS NEEDED FOR PALPITATIONS) 90 tablet 6   Omega-3 Fatty Acids (FISH OIL) 1200 MG CAPS Take 1,200 mg by mouth daily.     pantoprazole (PROTONIX) 40 MG tablet Take 1 tablet (40 mg total) by mouth daily.     potassium chloride (KLOR-CON M) 10 MEQ tablet Take 10 mEq by mouth 2 (two) times daily.     Turmeric 500 MG TABS Take 500 mg by mouth daily.      vitamin E 180 MG (400 UNITS) capsule Take 400 Units by mouth daily.     Zinc 50 MG TABS Take 50 mg by mouth daily.      No current facility-administered medications for this visit.     Past Surgical History:  Procedure Laterality Date   ABDOMINAL HYSTERECTOMY     age 14, complicated by poor wound healing, followed by revision of scar and radiation for ?malignancy   BACK SURGERY  07/2018   lost 2 inches   BREAST ENHANCEMENT SURGERY     BREAST IMPLANT EXCHANGE Right 06/23/2016   Procedure: RIGHT BREAST IMPLANT REMOVAL AND REPLACEMENT;  Surgeon: Cristine Polio, MD;  Location: [facility];  Service: Plastics;  Laterality: Right;   COLONOSCOPY  2005   2 hyperplastic polyps   COLONOSCOPY  2007   Dr. Oneida Alar- hemorrhoids   COLONOSCOPY  2008   Dr. Reed Pandy polyp- rare sigmoid diverticulosis, internal hemorrhoids   COLONOSCOPY  12/01/2011   Procedure: COLONOSCOPY;  Surgeon: Danie Binder, MD;  [facility];  Service: Endoscopy;  Laterality: N/A;  12:30 PM   CYSTOSCOPY/URETEROSCOPY/HOLMIUM LASER/STENT  PLACEMENT Left 02/29/2020   Procedure: CYSTOSCOPY/URETEROSCOPY/HOLMIUM LASER/STENT PLACEMENT;  Surgeon: Ceasar Mons, MD;  Location: [facility];  Service: Urology;  Laterality: Left;   epidural steroid  injection     She is getting injections with Dr. Nelva Bush q3wks.   ESOPHAGOGASTRODUODENOSCOPY (EGD) WITH ESOPHAGEAL DILATION N/A 04/15/2013   Procedure: ESOPHAGOGASTRODUODENOSCOPY (EGD) WITH ESOPHAGEAL DILATION;  Surgeon: Danie Binder, MD;  [facility];  Service: Endoscopy;  Laterality: N/A;  11:45-moved to 12:30 Darius Bump to notify pt   EYE SURGERY  2010   EYE SURGERY     IR KYPHO LUMBAR INC FX REDUCE BONE BX UNI/BIL CANNULATION INC/IMAGING  08/23/2018   IR RADIOLOGIST EVAL & MGMT  12/23/2018   right thumb surgery       Allergies  Allergen Reactions   Epinephrine Anaphylaxis   Hydrocodone-Acetaminophen    Demerol [Meperidine] Other (See Comments)    headaches   Vicodin [Hydrocodone-Acetaminophen] Other (See Comments)    [name] it makes her crazy. Pt tolerates acetaminophen      Family History  Problem Relation Age of Onset   Colon cancer Father        age 57   Prostate cancer Father    Pancreatic cancer Mother        age 32     Social History Ms. Dodgen reports that she has never smoked. She has never been exposed to tobacco smoke. She has never used smokeless tobacco. Ms. Casalino reports no history of alcohol use.   Review of Systems CONSTITUTIONAL: No weight loss, fever, chills, weakness or fatigue.  HEENT: Eyes: No visual loss, blurred vision, double vision or yellow sclerae.No hearing loss, sneezing, congestion, runny nose or sore throat.  SKIN: No rash or itching.  CARDIOVASCULAR: per hpi RESPIRATORY: pe rhpi GASTROINTESTINAL: No anorexia, nausea, vomiting or diarrhea. No abdominal pain or blood.  GENITOURINARY: No burning on urination, no polyuria NEUROLOGICAL: No headache, dizziness, syncope, paralysis, ataxia, numbness or tingling in the  extremities. No change in bowel or bladder control.  MUSCULOSKELETAL: No muscle, back pain, joint pain or stiffness.  LYMPHATICS: No enlarged nodes. No history of splenectomy.  PSYCHIATRIC: No history of depression or anxiety.  ENDOCRINOLOGIC: No reports of sweating, cold or heat intolerance. No polyuria or polydipsia.  Marland Kitchen   Physical Examination Today's Vitals   02/28/22 1016  BP: 124/84  Pulse: (!) 57  SpO2: 98%  Weight: 139 lb (63 kg)  Height: 5' (1.524 m)   Body mass index is 27.15 kg/m.  Gen: resting comfortably, no acute distress HEENT: no scleral icterus, pupils equal round and reactive, no palptable cervical adenopathy,  CV: RRR, no m/r/g no jvd Resp: Clear to auscultation bilaterally GI: abdomen is soft, non-tender, non-distended, normal bowel sounds, no hepatosplenomegaly MSK: extremities are warm, 1+ bilateral LE edema Skin: warm, no rash Neuro:  no focal deficits Psych: appropriate affect   Diagnostic Studies  2008 CORONARY ANGIOGRAPHY:  Left mainstem is angiographically normal.  It  bifurcates into the LAD and left circumflex.  The left main stem is  short.    The LAD is a medium caliber vessel in its proximal portion.  It gives  off a large first diagonal Tru Leopard that is actually bigger than the mid  and distal true LAD.  The LAD in its midportion has a long tubular 40%  stenosis.  The vessel beyond this is small diameter.  The first diagonal  [name], which again is a large vessel and almost serves as a twin LAD  system, is angiographically without significant disease.    The left circumflex has nonobstructive plaque in its ostium that appears  no worse than 20-25%.  The circumflex gives  off a first obtuse marginal  [name] which is tortuous but has no significant angiographic disease.  The remainder of the circumflex is small.    The right coronary artery is dominant.  It is angiographically normal  throughout its proximal and midportions.  The distal  vessel has  nonobstructive plaque.  It gives off a large posterior descending [name]  which is angiographically normal.  It gives off a medium size  posterolateral [name].    Left ventricular function assessed in the 30 degrees right anterior  oblique projection demonstrates normal left ventricular function with a  left ventricular ejection fraction of 65%.  There is no mitral  regurgitation.    ASSESSMENT:  1. Nonobstructive coronary artery disease.  2. Normal left ventricular function.    RECOMMENDATIONS:  The patient should continue with medical therapy for  coronary artery disease.  Will plan on discharge later today if there  are no other significant issues that come up.     Jan 2019 nuclear stress   There was no ST segment deviation noted during stress. The study is normal. No ischemia or scar. This is a low risk study. Nuclear stress EF: 75%.       Jan 2019 echo Study Conclusions   - Left ventricle: The cavity size was normal. Wall thickness was    increased in a pattern of mild LVH. Systolic function was normal.    The estimated ejection fraction was in the range of 60% to 65%.    Wall motion was normal; there were no regional wall motion    abnormalities. The study is not technically sufficient to allow    evaluation of LV diastolic function.  - Mitral valve: Mildly calcified annulus. There was trivial    regurgitation.  - Right atrium: Central venous pressure (est): 3 mm Hg.  - Atrial septum: No defect or patent foramen ovale was identified.  - Tricuspid valve: There was mild regurgitation.  - Pulmonary arteries: PA peak pressure: 36 mm Hg (S).  - Pericardium, extracardiac: There was no pericardial effusion.    11/2018 carotid US IMPRESSION: 1. Bilateral carotid bifurcation plaque resulting in less than 50% diameter ICA stenosis. 2. Antegrade bilateral vertebral arterial flow.   11/2018 echo IMPRESSIONS     1. The left ventricle has normal systolic  function with an ejection  fraction of 60-65%. The cavity size was normal. There is mildly increased  left ventricular wall thickness. Left ventricular diastolic Doppler  parameters are consistent with  pseudonormalization.   2. The right ventricle has normal systolic function. The cavity was  normal. There is no increase in right ventricular wall thickness. Right  ventricular systolic pressure is normal with an estimated pressure of 28.4  mmHg.   3. Left atrial size was mildly dilated.   4. The mitral valve is grossly normal. Mild calcification of the mitral  valve leaflet. There is moderate mitral annular calcification present.  There is mild mitral regurgitation.   5. The tricuspid valve is grossly normal.   6. The aortic valve has an indeterminate number of cusps. Moderate  calcification of the aortic valve.   7. The aorta is normal in size and structure.    10/2019 nuclear stress Lexiscan stress is electrically negative for ischemia Myoview scan shows normal perfusion No ischemia or scar LVEF calculated at 88% Low risk study   Assessment and Plan   1.PAF - symptoms improved on flecanide though not completely resolved - has upcoming EP appt, defer any adjustements -  continue eliquis for stroke prevention  2. HFpEF - some ongoing LE edema, increase lasix to '60mg'$  daily may take additioanl '20mg'$  prn - repeat echo given swelling has progressed, some recent SOB/DOE       [name], M.D.

## 2022-02-28 NOTE — Addendum Note (Signed)
Addended by: Laurine Blazer on: 02/28/2022 11:07 AM   Modules accepted: Orders

## 2022-03-03 ENCOUNTER — Other Ambulatory Visit: Payer: PPO

## 2022-03-03 DIAGNOSIS — M62551 Muscle wasting and atrophy, not elsewhere classified, right thigh: Secondary | ICD-10-CM | POA: Diagnosis not present

## 2022-03-03 DIAGNOSIS — M2569 Stiffness of other specified joint, not elsewhere classified: Secondary | ICD-10-CM | POA: Diagnosis not present

## 2022-03-03 DIAGNOSIS — M545 Low back pain, unspecified: Secondary | ICD-10-CM | POA: Diagnosis not present

## 2022-03-03 DIAGNOSIS — M62561 Muscle wasting and atrophy, not elsewhere classified, right lower leg: Secondary | ICD-10-CM | POA: Diagnosis not present

## 2022-03-04 DIAGNOSIS — Z7901 Long term (current) use of anticoagulants: Secondary | ICD-10-CM | POA: Diagnosis not present

## 2022-03-04 DIAGNOSIS — I10 Essential (primary) hypertension: Secondary | ICD-10-CM | POA: Diagnosis not present

## 2022-03-04 DIAGNOSIS — J45909 Unspecified asthma, uncomplicated: Secondary | ICD-10-CM | POA: Diagnosis not present

## 2022-03-04 DIAGNOSIS — Z515 Encounter for palliative care: Secondary | ICD-10-CM | POA: Diagnosis not present

## 2022-03-04 DIAGNOSIS — M545 Low back pain, unspecified: Secondary | ICD-10-CM | POA: Diagnosis not present

## 2022-03-04 DIAGNOSIS — Z66 Do not resuscitate: Secondary | ICD-10-CM | POA: Diagnosis not present

## 2022-03-04 DIAGNOSIS — Z6826 Body mass index (BMI) 26.0-26.9, adult: Secondary | ICD-10-CM | POA: Diagnosis not present

## 2022-03-04 DIAGNOSIS — D692 Other nonthrombocytopenic purpura: Secondary | ICD-10-CM | POA: Diagnosis not present

## 2022-03-06 DIAGNOSIS — M62551 Muscle wasting and atrophy, not elsewhere classified, right thigh: Secondary | ICD-10-CM | POA: Diagnosis not present

## 2022-03-06 DIAGNOSIS — M62561 Muscle wasting and atrophy, not elsewhere classified, right lower leg: Secondary | ICD-10-CM | POA: Diagnosis not present

## 2022-03-06 DIAGNOSIS — M2569 Stiffness of other specified joint, not elsewhere classified: Secondary | ICD-10-CM | POA: Diagnosis not present

## 2022-03-06 DIAGNOSIS — M545 Low back pain, unspecified: Secondary | ICD-10-CM | POA: Diagnosis not present

## 2022-03-07 ENCOUNTER — Ambulatory Visit (HOSPITAL_COMMUNITY)
Admission: RE | Admit: 2022-03-07 | Discharge: 2022-03-07 | Disposition: A | Discharge status: Home / Self Care | Payer: PPO | Source: Ambulatory Visit | Attending: Cardiology | Admitting: Cardiology

## 2022-03-07 DIAGNOSIS — R0602 Shortness of breath: Secondary | ICD-10-CM | POA: Insufficient documentation

## 2022-03-07 LAB — ECHOCARDIOGRAM COMPLETE
AR max vel: 1.68 cm2
AV Area VTI: 1.68 cm2
AV Area mean vel: 1.79 cm2
AV Mean grad: 4 mmHg
AV Peak grad: 8.9 mmHg
Ao pk vel: 1.49 m/s
Area-P 1/2: 3.42 cm2
MV VTI: 1.76 cm2
S' Lateral: 2.15 cm

## 2022-03-07 NOTE — Progress Notes (Signed)
*  PRELIMINARY RESULTS* Echocardiogram 2D Echocardiogram has been performed.  [name] 03/07/2022, 12:31 PM

## 2022-03-10 DIAGNOSIS — M62561 Muscle wasting and atrophy, not elsewhere classified, right lower leg: Secondary | ICD-10-CM | POA: Diagnosis not present

## 2022-03-10 DIAGNOSIS — M62551 Muscle wasting and atrophy, not elsewhere classified, right thigh: Secondary | ICD-10-CM | POA: Diagnosis not present

## 2022-03-10 DIAGNOSIS — M545 Low back pain, unspecified: Secondary | ICD-10-CM | POA: Diagnosis not present

## 2022-03-10 DIAGNOSIS — M2569 Stiffness of other specified joint, not elsewhere classified: Secondary | ICD-10-CM | POA: Diagnosis not present

## 2022-03-11 ENCOUNTER — Ambulatory Visit: Payer: PPO | Attending: Internal Medicine

## 2022-03-11 ENCOUNTER — Encounter: Payer: Self-pay | Admitting: Internal Medicine

## 2022-03-11 ENCOUNTER — Encounter: Payer: PPO | Attending: Internal Medicine | Admitting: Internal Medicine

## 2022-03-11 VITALS — BP 138/84 | HR 49 | Ht 60.0 in | Wt 139.4 lb

## 2022-03-11 DIAGNOSIS — I480 Paroxysmal atrial fibrillation: Secondary | ICD-10-CM

## 2022-03-11 NOTE — Patient Instructions (Signed)
Medication Instructions:  Your physician recommends that you continue on your current medications as directed. Please refer to the Current Medication list given to you today.   Labwork: None today  Testing/Procedures: ZIO XT- Long Term Monitor Instructions   Your physician has requested you wear your ZIO patch monitor__7_____days.   This is a single patch monitor.  [employer] supplies one patch monitor per enrollment.  Additional stickers are not available.   Do not shower for the first 24 hours.  You may shower after the first 24 hours.   Press button if you feel a symptom. You will hear a small click.  Record Date, Time and Symptom in the Patient Log Book.   When you are ready to remove patch, follow instructions on last 2 pages of Patient Log Book.  Stick patch monitor onto last page of Patient Log Book.   Place Patient Log Book in [place] box.  Use locking tab on box and tape box closed securely.  The [employer] and [employer] has [employer] on it.  Please place in mailbox as soon as possible.  Your physician should have your test results approximately 7 days after the monitor has been mailed back to [facility].   Call [place] at [phone] if you have questions regarding your ZIO XT patch monitor.  Call them immediately if you see an orange light blinking on your monitor.   If your monitor falls off in less than 4 days contact our Monitor department at [phone].  If your monitor becomes loose or falls off after 4 days call [employer] at [phone] for suggestions on securing your monitor.    Follow-Up: To be determined after monitor  Any Other Special Instructions Will Be Listed Below (If Applicable).  If you need a refill on your cardiac medications before your next appointment, please call your pharmacy.

## 2022-03-11 NOTE — Progress Notes (Signed)
HPI [name] is referred today by Dr. Harl Bowie for evaluation of atrial fib. She is a pleasant 86 yo woman with PAF and a RVR and is quite symptomatic. She experiences chest pain and palpitations with her atrial fib. She has been placed on eliquis. She denies syncope. She has been on a beta blocker for a long time to try and control the atrial fib. She also has a h/o spine fracture and is s/p Kyphoplasty. Since starting her flecainide she has still had occaisional symptoms. In addition she thinks that she has some slow heart beats.  Allergies  Allergen Reactions   Epinephrine Anaphylaxis   Hydrocodone-Acetaminophen    Demerol [Meperidine] Other (See Comments)    headaches   Vicodin [Hydrocodone-Acetaminophen] Other (See Comments)    [name] it makes her crazy. Pt tolerates acetaminophen     Current Outpatient Medications  Medication Sig Dispense Refill   albuterol (PROVENTIL) (2.5 MG/3ML) 0.083% nebulizer solution Take 3 mLs (2.5 mg total) by nebulization every 6 (six) hours as needed for wheezing or shortness of breath. 75 mL 0   albuterol (VENTOLIN HFA) 108 (90 Base) MCG/ACT inhaler Inhale 1-2 puffs into the lungs every 6 (six) hours as needed for wheezing or shortness of breath.     amLODipine (NORVASC) 5 MG tablet Take 1 tablet (5 mg total) by mouth daily. 30 tablet 0   Ascorbic Acid (VITAMIN C) 1000 MG tablet Take 1,000 mg by mouth daily.     B Complex Vitamins (VITAMIN B COMPLEX) TABS Take 1 tablet by mouth daily.     Biotin 300 MCG TABS Take 300 mcg by mouth daily after supper.      Calcium Carb-Cholecalciferol (CALCIUM 600 + D PO) Take 1 tablet by mouth in the morning and at bedtime.     ELIQUIS 5 MG TABS tablet Take 5 mg by mouth 2 (two) times daily.     ezetimibe (ZETIA) 10 MG tablet Take 1 tablet (10 mg total) by mouth daily. 30 tablet 0   famotidine (PEPCID) 20 MG tablet Take 20 mg by mouth 2 (two) times daily.     Ferrous Gluconate (IRON) 240 (27 Fe) MG TABS Take 1  tablet by mouth daily. 30 tablet 0   Flaxseed, Linseed, (FLAXSEED OIL) 1000 MG CAPS Take 1,000 mg by mouth daily.      flecainide (TAMBOCOR) 50 MG tablet TAKE 1 TABLET(50 MG) BY MOUTH TWICE DAILY 180 tablet 3   furosemide (LASIX) 40 MG tablet Take 1.5 tablets (60 mg total) by mouth daily. (May take an additional '20mg'$  as needed for swelling.) 60 tablet 6   Lecithin 1200 MG CAPS Take 1,200 mg by mouth daily.     levothyroxine (SYNTHROID) 75 MCG tablet Take 75 mcg by mouth daily.     Lutein 40 MG CAPS Take 40 mg by mouth daily.      Magnesium 250 MG TABS Take 250 mg by mouth daily.      memantine (NAMENDA) 5 MG tablet TAKE 2 TABLETS BY MOUTH TWICE DAILY 120 tablet 2   metoprolol tartrate (LOPRESSOR) 25 MG tablet Take 1 tablet (25 mg total) by mouth 2 (two) times daily. (MAY TAKE AN ADDITIONAL '25MG'$  AS NEEDED FOR PALPITATIONS) 90 tablet 6   Omega-3 Fatty Acids (FISH OIL) 1200 MG CAPS Take 1,200 mg by mouth daily.     pantoprazole (PROTONIX) 40 MG tablet Take 1 tablet (40 mg total) by mouth daily.     potassium chloride (KLOR-CON M)  10 MEQ tablet Take 10 mEq by mouth 2 (two) times daily.     Turmeric 500 MG TABS Take 500 mg by mouth daily.      vitamin E 180 MG (400 UNITS) capsule Take 400 Units by mouth daily.     Zinc 50 MG TABS Take 50 mg by mouth daily.      No current facility-administered medications for this visit.     Past Medical History:  Diagnosis Date   Adenomatous colon polyp 2008   Anxiety    Asthma    has not needed in over a year   Complication of anesthesia    pt reports TIA and sezures after surgery   Coronary artery disease    CVA (cerebral vascular accident) ([place])    x 3   Degenerative disc disease, lumbar    Depression    Diverticulosis    GERD (gastroesophageal reflux disease)    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89   age 68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 2 weeks   Hemorrhoids 2007   History of kidney stones    HTN (hypertension)    Hyperplastic colon polyp 2005   Hypothyroidism     Narcolepsy    PONV (postoperative nausea and vomiting)    TIA (transient ischemic attack)     ROS:   All systems reviewed and negative except as noted in the HPI.   Past Surgical History:  Procedure Laterality Date   ABDOMINAL HYSTERECTOMY     age 28, complicated by poor wound healing, followed by revision of scar and radiation for ?malignancy   BACK SURGERY  07/2018   lost 2 inches   BREAST ENHANCEMENT SURGERY     BREAST IMPLANT EXCHANGE Right 06/23/2016   Procedure: RIGHT BREAST IMPLANT REMOVAL AND REPLACEMENT;  Surgeon: Cristine Polio, MD;  Location: [facility];  Service: Plastics;  Laterality: Right;   COLONOSCOPY  2005   2 hyperplastic polyps   COLONOSCOPY  2007   Dr. Oneida Alar- hemorrhoids   COLONOSCOPY  2008   Dr. Reed Pandy polyp- rare sigmoid diverticulosis, internal hemorrhoids   COLONOSCOPY  12/01/2011   Procedure: COLONOSCOPY;  Surgeon: Danie Binder, MD;  [facility];  Service: Endoscopy;  Laterality: N/A;  12:30 PM   CYSTOSCOPY/URETEROSCOPY/HOLMIUM LASER/STENT PLACEMENT Left 02/29/2020   Procedure: CYSTOSCOPY/URETEROSCOPY/HOLMIUM LASER/STENT PLACEMENT;  Surgeon: Ceasar Mons, MD;  Location: [facility];  Service: Urology;  Laterality: Left;   epidural steroid injection     She is getting injections with Dr. Nelva Bush q3wks.   ESOPHAGOGASTRODUODENOSCOPY (EGD) WITH ESOPHAGEAL DILATION N/A 04/15/2013   Procedure: ESOPHAGOGASTRODUODENOSCOPY (EGD) WITH ESOPHAGEAL DILATION;  Surgeon: Danie Binder, MD;  [facility];  Service: Endoscopy;  Laterality: N/A;  11:45-moved to 12:30 Darius Bump to notify pt   EYE SURGERY  2010   EYE SURGERY     IR KYPHO LUMBAR INC FX REDUCE BONE BX UNI/BIL CANNULATION INC/IMAGING  08/23/2018   IR RADIOLOGIST EVAL & MGMT  12/23/2018   right thumb surgery       Family History  Problem Relation Age of Onset   Colon cancer Father        age 48   Prostate cancer Father    Pancreatic cancer Mother         age 24     Social History   Socioeconomic History   Marital status: Widowed    Spouse name: Not on file   Number of children: 1   Years of education: 3  Highest education level: 12th grade  Occupational History   Not on file  Tobacco Use   Smoking status: Never    Passive exposure: Never   Smokeless tobacco: Never  Vaping Use   Vaping Use: Never used  Substance and Sexual Activity   Alcohol use: No   Drug use: No   Sexual activity: Not Currently    Partners: Male  Other Topics Concern   Not on file  Social History Narrative   Lives alone   Right Handed    Drinks no caffeine daily   Social Determinants of Health   Financial Resource Strain: Low Risk  (02/06/2022)   Overall Financial Resource Strain (CARDIA)    Difficulty of Paying Living Expenses: Not hard at all  Food Insecurity: No Food Insecurity (02/06/2022)   Hunger Vital Sign    Worried About Running Out of Food in the Last Year: Never true    Ran Out of Food in the Last Year: Never true  Transportation Needs: No Transportation Needs (02/06/2022)   PRAPARE - Hydrologist (Medical): No    Lack of Transportation (Non-Medical): No  Physical Activity: Inactive (02/06/2022)   Exercise Vital Sign    Days of Exercise per Week: 0 days    Minutes of Exercise per Session: 0 min  Stress: No Stress Concern Present (02/06/2022)   Aline    Feeling of Stress : Only a little  Social Connections: Moderately Integrated (02/06/2022)   Social Connection and Isolation Panel [NHANES]    Frequency of Communication with Friends and Family: More than three times a week    Frequency of Social Gatherings with Friends and Family: More than three times a week    Attends Religious Services: More than 4 times per year    Active Member of [employer] or Organizations: Yes    Attends Archivist Meetings: More than 4 times per year     Marital Status: Widowed  Intimate Partner Violence: Not At Risk (02/06/2022)   Humiliation, Afraid, Rape, and Kick questionnaire    Fear of Current or Ex-Partner: No    Emotionally Abused: No    Physically Abused: No    Sexually Abused: No     BP 138/84   Pulse (!) 49   Ht 5' (1.524 m)   Wt 139 lb 6.4 oz (63.2 kg)   SpO2 97%   BMI 27.22 kg/m   Physical Exam:  elderly appearing NAD HEENT: Unremarkable Neck:  No JVD, no thyromegally Lymphatics:  No adenopathy Back:  No CVA tenderness Lungs:  Clear with no wheezes HEART:  Regular rate rhythm, no murmurs, no rubs, no clicks Abd:  soft, positive bowel sounds, no organomegally, no rebound, no guarding Ext:  2 plus pulses, no edema, no cyanosis, no clubbing Skin:  No rashes no nodules Neuro:  CN II through XII intact, motor grossly intact  EKG - sinus brady with first degree AV block  DEVICE  Normal device function.  See PaceArt for details.   Assess/Plan:   PAF - she is improved. I have recommended she continue low dose flecainide. Continue beta blocker. I have asked her to obtain a 7 day zio monitor. She may well be having symptomatic bradycardia and if so PPM will be a consideration. Coags - she will continue her eliquis.   Cynthia Overlie Analis Distler,MD

## 2022-03-13 ENCOUNTER — Ambulatory Visit: Payer: Self-pay | Admitting: *Deleted

## 2022-03-13 ENCOUNTER — Encounter: Payer: Self-pay | Admitting: *Deleted

## 2022-03-13 DIAGNOSIS — M545 Low back pain, unspecified: Secondary | ICD-10-CM | POA: Diagnosis not present

## 2022-03-13 DIAGNOSIS — M62561 Muscle wasting and atrophy, not elsewhere classified, right lower leg: Secondary | ICD-10-CM | POA: Diagnosis not present

## 2022-03-13 DIAGNOSIS — M2569 Stiffness of other specified joint, not elsewhere classified: Secondary | ICD-10-CM | POA: Diagnosis not present

## 2022-03-13 DIAGNOSIS — M62551 Muscle wasting and atrophy, not elsewhere classified, right thigh: Secondary | ICD-10-CM | POA: Diagnosis not present

## 2022-03-13 NOTE — Patient Instructions (Signed)
Visit Information  Thank you for taking time to visit with me today. Please don't hesitate to contact me if I can be of assistance to you.   Following are the goals we discussed today:   Goals Addressed               This Visit's Progress     COMPLETED: Receive Assistance with Completion of Advanced Directives (Living Will and [place]). (pt-stated)   On track     Care Coordination Interventions:   Advanced Directives (Living Will and [place]), Initiated.   Patient Encouraged to Provide a Copy of Completed Advanced Directives (Living Will and Healthcare Power of [place]) to Primary Care Provider, Dr. Allyn Kenner to Scan into Electronic Medical Record.       Please call the care guide team at [phone] if you need to cancel or reschedule your appointment.   If you are experiencing a Mental Health or [place] or need someone to talk to, please call the Suicide and Crisis Lifeline: 988 call the [place] National Suicide Prevention Lifeline: 854-767-8131 or TTY: 979-323-4016 TTY 931-761-5916) to talk to a trained counselor call 1-800-273-TALK (toll free, 24 hour hotline) go to [facility] [address], [place] [phone]) call the [place]: [phone] call 911  Patient verbalizes understanding of instructions and care plan provided today and agrees to view in [place]. Active MyChart status and patient understanding of how to access instructions and care plan via MyChart confirmed with patient.     No further follow up required.  Nat Christen, BSW, MSW, LCSW  Licensed Education officer, environmental Health System  Mailing [place] [address] [place], [place] [address] Physical Address-[address] [place], [place] [address] Toll Free Main # [phone] Fax # [phone] Cell #  [phone] [email or web address]

## 2022-03-13 NOTE — Patient Outreach (Signed)
  Care Coordination   Follow Up Visit Note   03/13/2022  Name: [name] MRN: [id number] DOB: Sep 27, 1935  [name] is a 86 y.o. year old female who sees Nevada Crane, Edwinna Areola, MD for primary care. I spoke with Carman Ching by phone today.  What matters to the patients health and wellness today?  Receive Assistance with Completion of Advanced Directives ([place]).    Goals Addressed               This Visit's Progress     COMPLETED: Receive Assistance with Completion of Advanced Directives (Living Will and [place]). (pt-stated)   On track     Care Coordination Interventions:   Advanced Directives (Living Will and [place]), Initiated.   Patient Encouraged to Provide a Copy of Completed Advanced Directives (Living Will and Healthcare Power of [place]) to Primary Care Provider, Dr. Allyn Kenner to Scan into Electronic Medical Record.        SDOH assessments and interventions completed:  Yes.   Care Coordination Interventions Activated:  Yes.   Care Coordination Interventions:  Yes, provided.   Follow up plan: No further intervention required.   Encounter Outcome:  Pt. Visit Completed.   Nat Christen, BSW, MSW, LCSW  Licensed Education officer, environmental Health System  Mailing [place] [address], [place], [place] [address] Physical Address-[address] [place], [place] [address] Toll Free Main # [phone] Fax # [phone] Cell # [phone] [email or web address]

## 2022-03-14 ENCOUNTER — Encounter (INDEPENDENT_AMBULATORY_CARE_PROVIDER_SITE_OTHER): Payer: Self-pay

## 2022-03-14 DIAGNOSIS — J069 Acute upper respiratory infection, unspecified: Secondary | ICD-10-CM | POA: Diagnosis not present

## 2022-03-17 ENCOUNTER — Telehealth: Payer: Self-pay | Admitting: Internal Medicine

## 2022-03-17 ENCOUNTER — Telehealth: Payer: Self-pay | Admitting: *Deleted

## 2022-03-17 NOTE — Telephone Encounter (Signed)
Spoke to pt who stated that she had a question about her monitor- pt stated that she is supposed to return HM tmrw, but had not written anything down in the log and wanted to know if this would affect her monitor- patient advised it would not.   Pt stated that she had a brief episode of cp. Pt stated that she is not currently having any symptoms. Pt stated that she has had some congestion for the last several days and while on the phone, did have a hacking cough- pt stated that she was hoarse and this may have contributed to her CP. Advised pt that I would fwd message to provider for review. Pt had no other complaints at this time  *Pt has seen PCP for congestion/cough*

## 2022-03-17 NOTE — Telephone Encounter (Signed)
New Message:       Patient says she is wearing a Monitor and had an episode yesterday.    Pt c/o of Chest Pain: STAT if CP now or developed within 24 hours  1. Are you having CP right now? No- had chest pains yesterday  2. Are you experiencing any other symptoms (ex. SOB, nausea, vomiting, sweating)? Shortness of breath yesterday and unsteady on her feet when she had the episode  3. How long have you been experiencing CP? Only yesterday  4. Is your CP continuous or coming and going? It lasted for less than 10 minutes  5. Have you taken Nitroglycerin? She never had any ? Marland Kitchen

## 2022-03-17 NOTE — Telephone Encounter (Signed)
Patient stated she talkes with her doctors office, (monitor was applied at [place] office).  She was informed to remove monitor and mail back to [facility].  [name] will mail back Wednesday.

## 2022-03-17 NOTE — Telephone Encounter (Signed)
Patient called to talk with [name] about heart monitor

## 2022-03-17 NOTE — Telephone Encounter (Signed)
Left a message for patient to call our office back.  

## 2022-03-19 ENCOUNTER — Ambulatory Visit: Payer: PPO | Admitting: Internal Medicine

## 2022-03-20 ENCOUNTER — Ambulatory Visit (INDEPENDENT_AMBULATORY_CARE_PROVIDER_SITE_OTHER): Payer: PPO | Admitting: Internal Medicine

## 2022-03-20 ENCOUNTER — Telehealth: Payer: Self-pay | Admitting: *Deleted

## 2022-03-20 ENCOUNTER — Encounter: Payer: Self-pay | Admitting: Internal Medicine

## 2022-03-20 VITALS — BP 186/79 | HR 45 | Temp 97.7°F | Ht 60.0 in | Wt 140.8 lb

## 2022-03-20 DIAGNOSIS — Z7901 Long term (current) use of anticoagulants: Secondary | ICD-10-CM | POA: Diagnosis not present

## 2022-03-20 DIAGNOSIS — R140 Abdominal distension (gaseous): Secondary | ICD-10-CM

## 2022-03-20 DIAGNOSIS — K219 Gastro-esophageal reflux disease without esophagitis: Secondary | ICD-10-CM | POA: Diagnosis not present

## 2022-03-20 DIAGNOSIS — R1032 Left lower quadrant pain: Secondary | ICD-10-CM

## 2022-03-20 DIAGNOSIS — K625 Hemorrhage of anus and rectum: Secondary | ICD-10-CM

## 2022-03-20 DIAGNOSIS — D123 Benign neoplasm of transverse colon: Secondary | ICD-10-CM

## 2022-03-20 NOTE — Patient Instructions (Signed)
We will reach out to your cardiologist and discuss option of performing colonoscopy.  If approved, you will need to hold your Eliquis x48 hours prior to procedure.  Continue famotidine for chronic reflux.  It was very nice meeting you today.  Dr. Abbey Chatters

## 2022-03-20 NOTE — Progress Notes (Signed)
We will reach out to your cardiologist   Primary Care Physician:  Celene Squibb, MD Primary Gastroenterologist:  Dr. Abbey Chatters  Chief Complaint  Patient presents with   New Patient (Initial Visit)    Abd pain and bloating. Not have the pain right now. (Upper part of abd)    HPI:   [name] is a 86 y.o. female who presents to the clinic today by referral from her PCP Dr. Nevada Crane for evaluation.  History of chronic GERD which is well controlled on famotidine.  No dysphagia odynophagia.  No epigastric or chest pain.  Has had worsening abdominal pain, primarily left lower quadrant, intermittent in nature, mild to moderate in severity.  Notes associated abdominal bloating as well.  Has also had rectal bleeding primarily with bowel movements.  States her bowels are moving well over the last 2 to 3 months.  Historically has been constipated however.  Last colonoscopy 2013 with multiple polyps removed including a few adenomatous colon polyps.  Notes family history of colon cancer in her father.  Mother had pancreatic cancer.  No weight loss.  History of atrial fibrillation, prior TIAs and stroke, currently maintained on Eliquis.  Past Medical History:  Diagnosis Date   Adenomatous colon polyp 2008   Anxiety    Asthma    has not needed in over a year   Complication of anesthesia    pt reports TIA and sezures after surgery   Coronary artery disease    CVA (cerebral vascular accident) ([place])    x 3   Degenerative disc disease, lumbar    Depression    Diverticulosis    GERD (gastroesophageal reflux disease)    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72   age 32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 2 weeks   Hemorrhoids 2007   History of kidney stones    HTN (hypertension)    Hyperplastic colon polyp 2005   Hypothyroidism    Narcolepsy    PONV (postoperative nausea and vomiting)    TIA (transient ischemic attack)     Past Surgical History:  Procedure Laterality Date   ABDOMINAL HYSTERECTOMY     age 17,  complicated by poor wound healing, followed by revision of scar and radiation for ?malignancy   BACK SURGERY  07/2018   lost 2 inches   BREAST ENHANCEMENT SURGERY     BREAST IMPLANT EXCHANGE Right 06/23/2016   Procedure: RIGHT BREAST IMPLANT REMOVAL AND REPLACEMENT;  Surgeon: Cristine Polio, MD;  Location: [facility];  Service: Plastics;  Laterality: Right;   COLONOSCOPY  2005   2 hyperplastic polyps   COLONOSCOPY  2007   Dr. Oneida Alar- hemorrhoids   COLONOSCOPY  2008   Dr. Reed Pandy polyp- rare sigmoid diverticulosis, internal hemorrhoids   COLONOSCOPY  12/01/2011   Procedure: COLONOSCOPY;  Surgeon: Danie Binder, MD;  [facility]: [facility];  Service: Endoscopy;  Laterality: N/A;  12:30 PM   CYSTOSCOPY/URETEROSCOPY/HOLMIUM LASER/STENT PLACEMENT Left 02/29/2020   Procedure: CYSTOSCOPY/URETEROSCOPY/HOLMIUM LASER/STENT PLACEMENT;  Surgeon: Ceasar Mons, MD;  Location: [facility];  Service: Urology;  Laterality: Left;   epidural steroid injection     She is getting injections with Dr. Nelva Bush q3wks.   ESOPHAGOGASTRODUODENOSCOPY (EGD) WITH ESOPHAGEAL DILATION N/A 04/15/2013   Procedure: ESOPHAGOGASTRODUODENOSCOPY (EGD) WITH ESOPHAGEAL DILATION;  Surgeon: Danie Binder, MD;  [facility]: [facility];  Service: Endoscopy;  Laterality: N/A;  11:45-moved to 12:30 Darius Bump to notify pt   EYE SURGERY  2010   EYE SURGERY  IR KYPHO LUMBAR INC FX REDUCE BONE BX UNI/BIL CANNULATION INC/IMAGING  08/23/2018   IR RADIOLOGIST EVAL & MGMT  12/23/2018   right thumb surgery      Current Outpatient Medications  Medication Sig Dispense Refill   albuterol (PROVENTIL) (2.5 MG/3ML) 0.083% nebulizer solution Take 3 mLs (2.5 mg total) by nebulization every 6 (six) hours as needed for wheezing or shortness of breath. 75 mL 0   albuterol (VENTOLIN HFA) 108 (90 Base) MCG/ACT inhaler Inhale 1-2 puffs into the lungs every 6 (six) hours as needed for wheezing or shortness of breath.      Ascorbic Acid (VITAMIN C) 1000 MG tablet Take 1,000 mg by mouth daily.     B Complex Vitamins (VITAMIN B COMPLEX) TABS Take 1 tablet by mouth daily.     Biotin 300 MCG TABS Take 300 mcg by mouth daily after supper.      Calcium Carb-Cholecalciferol (CALCIUM 600 + D PO) Take 1 tablet by mouth in the morning and at bedtime.     ELIQUIS 5 MG TABS tablet Take 5 mg by mouth 2 (two) times daily.     ezetimibe (ZETIA) 10 MG tablet Take 1 tablet (10 mg total) by mouth daily. 30 tablet 0   famotidine (PEPCID) 20 MG tablet Take 20 mg by mouth 2 (two) times daily.     Ferrous Gluconate (IRON) 240 (27 Fe) MG TABS Take 1 tablet by mouth daily. 30 tablet 0   Flaxseed, Linseed, (FLAXSEED OIL) 1000 MG CAPS Take 1,000 mg by mouth daily.      flecainide (TAMBOCOR) 50 MG tablet TAKE 1 TABLET(50 MG) BY MOUTH TWICE DAILY 180 tablet 3   furosemide (LASIX) 40 MG tablet Take 1.5 tablets (60 mg total) by mouth daily. (May take an additional '20mg'$  as needed for swelling.) 60 tablet 6   Lutein 40 MG CAPS Take 40 mg by mouth daily.      Magnesium 250 MG TABS Take 250 mg by mouth daily.      memantine (NAMENDA) 5 MG tablet TAKE 2 TABLETS BY MOUTH TWICE DAILY 120 tablet 2   metoprolol tartrate (LOPRESSOR) 25 MG tablet Take 1 tablet (25 mg total) by mouth 2 (two) times daily. (MAY TAKE AN ADDITIONAL '25MG'$  AS NEEDED FOR PALPITATIONS) 90 tablet 6   Omega-3 Fatty Acids (FISH OIL) 1200 MG CAPS Take 1,200 mg by mouth daily.     pantoprazole (PROTONIX) 40 MG tablet Take 1 tablet (40 mg total) by mouth daily.     potassium chloride (KLOR-CON M) 10 MEQ tablet Take 10 mEq by mouth 2 (two) times daily.     Turmeric 500 MG TABS Take 500 mg by mouth daily.      vitamin E 180 MG (400 UNITS) capsule Take 400 Units by mouth daily.     Zinc 50 MG TABS Take 50 mg by mouth daily.      amLODipine (NORVASC) 5 MG tablet Take 1 tablet (5 mg total) by mouth daily. (Patient not taking: Reported on 03/20/2022) 30 tablet 0   Lecithin 1200 MG CAPS Take  1,200 mg by mouth daily. (Patient not taking: Reported on 03/20/2022)     levothyroxine (SYNTHROID) 75 MCG tablet Take 75 mcg by mouth daily. (Patient not taking: Reported on 03/20/2022)     No current facility-administered medications for this visit.    Allergies as of 03/20/2022 - Review Complete 03/20/2022  Allergen Reaction Noted   Epinephrine Anaphylaxis 11/18/2011   Hydrocodone-acetaminophen  01/31/2021   Demerol [  meperidine] Other (See Comments) 11/18/2011   Vicodin [hydrocodone-acetaminophen] Other (See Comments) 11/18/2011    Family History  Problem Relation Age of Onset   Colon cancer Father        age 49   Prostate cancer Father    Pancreatic cancer Mother        age 24    Social History   Socioeconomic History   Marital status: Widowed    Spouse name: Not on file   Number of children: 1   Years of education: 9   Highest education level: 12th grade  Occupational History   Not on file  Tobacco Use   Smoking status: Never    Passive exposure: Never   Smokeless tobacco: Never  Vaping Use   Vaping Use: Never used  Substance and Sexual Activity   Alcohol use: No   Drug use: No   Sexual activity: Not Currently    Partners: Male  Other Topics Concern   Not on file  Social History Narrative   Lives alone   Right Handed    Drinks no caffeine daily   Social Determinants of Health   Financial Resource Strain: Low Risk  (02/06/2022)   Overall Financial Resource Strain (CARDIA)    Difficulty of Paying Living Expenses: Not hard at all  Food Insecurity: No Food Insecurity (02/06/2022)   Hunger Vital Sign    Worried About Running Out of Food in the Last Year: Never true    Ran Out of Food in the Last Year: Never true  Transportation Needs: No Transportation Needs (02/06/2022)   PRAPARE - Hydrologist (Medical): No    Lack of Transportation (Non-Medical): No  Physical Activity: Inactive (02/06/2022)   Exercise Vital Sign    Days of  Exercise per Week: 0 days    Minutes of Exercise per Session: 0 min  Stress: No Stress Concern Present (02/06/2022)   [place]    Feeling of Stress : Only a little  Social Connections: Moderately Integrated (02/06/2022)   Social Connection and Isolation Panel [NHANES]    Frequency of Communication with Friends and Family: More than three times a week    Frequency of Social Gatherings with Friends and Family: More than three times a week    Attends Religious Services: More than 4 times per year    Active Member of [employer] or Organizations: Yes    Attends Archivist Meetings: More than 4 times per year    Marital Status: Widowed  Intimate Partner Violence: Not At Risk (02/06/2022)   Humiliation, Afraid, Rape, and Kick questionnaire    Fear of Current or Ex-Partner: No    Emotionally Abused: No    Physically Abused: No    Sexually Abused: No    Subjective: Review of Systems  Constitutional:  Negative for chills and fever.  HENT:  Negative for congestion and hearing loss.   Eyes:  Negative for blurred vision and double vision.  Respiratory:  Negative for cough and shortness of breath.   Cardiovascular:  Negative for chest pain and palpitations.  Gastrointestinal:  Positive for abdominal pain, blood in stool and constipation. Negative for diarrhea, heartburn, melena and vomiting.       Abdominal bloating  Genitourinary:  Negative for dysuria and urgency.  Musculoskeletal:  Negative for joint pain and myalgias.  Skin:  Negative for itching and rash.  Neurological:  Negative for dizziness and headaches.  Psychiatric/Behavioral:  Negative for depression. The patient is not nervous/anxious.        Objective: BP (!) 186/79   Pulse (!) 45   Temp 97.7 F (36.5 C)   Ht 5' (1.524 m)   Wt 140 lb 12.8 oz (63.9 kg)   BMI 27.50 kg/m  Physical Exam Constitutional:      Appearance: Normal appearance.  HENT:      Head: Normocephalic and atraumatic.  Eyes:     Extraocular Movements: Extraocular movements intact.     Conjunctiva/sclera: Conjunctivae normal.  Cardiovascular:     Rate and Rhythm: Regular rhythm. Bradycardia present.  Pulmonary:     Effort: Pulmonary effort is normal.     Breath sounds: Normal breath sounds.  Abdominal:     General: Bowel sounds are normal.     Palpations: Abdomen is soft.  Musculoskeletal:        General: No swelling. Normal range of motion.     Cervical back: Normal range of motion and neck supple.  Skin:    General: Skin is warm and dry.     Coloration: Skin is not jaundiced.  Neurological:     General: No focal deficit present.     Mental Status: She is alert and oriented to person, place, and time.  Psychiatric:        Mood and Affect: Mood normal.        Behavior: Behavior normal.      Assessment: *Abdominal pain *Abdominal bloating *Rectal bleeding  *Chronic GERD *Adenomatous colon polyps *Family history of colon cancer in father  *Chronic anticoagulation  Plan: Discussed patient's symptoms in depth with her today.  Given her comorbidities including atrial fibrillation, previous TIAs and strokes, she would be higher risk for procedures and for stopping systemic anticoagulation.  Discussed just monitoring for now, but patient is adamant that she would like to have a colonoscopy especially given her family history of colon cancer, previous polyps, abdominal pain, rectal bleeding.  We will reach out to patient's cardiologist in this regard.  If approved, we will proceed with colonoscopy.  Ideally hold anticoagulation prior though if unable/too high risk, we can perform colonoscopy on systemic anticoagulation but would be more of a diagnostic procedure only.  Continue famotidine for chronic reflux which is well controlled.  Thank you Dr. Nevada Crane for the kind referral.  03/20/2022 10:49 AM   Disclaimer: This note was dictated with voice  recognition software. Similar sounding words can inadvertently be transcribed and may not be corrected upon review.

## 2022-03-20 NOTE — Telephone Encounter (Signed)
All noted. Thx.

## 2022-03-20 NOTE — Telephone Encounter (Signed)
Patient on Eliquis for atrial fibrillation. Will route to pharmacy team for input.    CHA2DS2-VASc Score = 8   This indicates a 10.8% annual risk of stroke. The patient's score is based upon: CHF History: 1 HTN History: 1 Diabetes History: 0 Stroke History: 2 Vascular Disease History: 1 (aortic atherosclerosis by CT) Age Score: 2 Gender Score: 1     Platelet count: 08/07/2021: Platelets 272   Creatinine clearance: 32 mL/min adjusted by weight (39 mL/min without weight adjustment)  08/07/2021: Creatinine, Ser 1.08; Hemoglobin 13.7   Loel Dubonnet, NP  03/20/22  11:16 AM

## 2022-03-20 NOTE — Telephone Encounter (Signed)
  Request for patient to stop medication prior to procedure or is needing cleareance  03/20/22  [name] 08/21/1935  What type of surgery is being performed? Colonoscopt  When is surgery scheduled? TBD  Needs cardiac and medication clearance to hold Eliquis 48 hours prior.  Name of physician performing surgery?  Dr. Hurshel Keys [facility] Phone: [phone] Fax: [phone]  Anethesia type (none, local, MAC, general)? MAC

## 2022-03-21 ENCOUNTER — Encounter: Payer: Self-pay | Admitting: *Deleted

## 2022-03-21 DIAGNOSIS — M2569 Stiffness of other specified joint, not elsewhere classified: Secondary | ICD-10-CM | POA: Diagnosis not present

## 2022-03-21 DIAGNOSIS — M62561 Muscle wasting and atrophy, not elsewhere classified, right lower leg: Secondary | ICD-10-CM | POA: Diagnosis not present

## 2022-03-21 DIAGNOSIS — M545 Low back pain, unspecified: Secondary | ICD-10-CM | POA: Diagnosis not present

## 2022-03-21 DIAGNOSIS — M62551 Muscle wasting and atrophy, not elsewhere classified, right thigh: Secondary | ICD-10-CM | POA: Diagnosis not present

## 2022-03-24 DIAGNOSIS — M62561 Muscle wasting and atrophy, not elsewhere classified, right lower leg: Secondary | ICD-10-CM | POA: Diagnosis not present

## 2022-03-24 DIAGNOSIS — M545 Low back pain, unspecified: Secondary | ICD-10-CM | POA: Diagnosis not present

## 2022-03-24 DIAGNOSIS — M2569 Stiffness of other specified joint, not elsewhere classified: Secondary | ICD-10-CM | POA: Diagnosis not present

## 2022-03-24 DIAGNOSIS — M62551 Muscle wasting and atrophy, not elsewhere classified, right thigh: Secondary | ICD-10-CM | POA: Diagnosis not present

## 2022-03-24 NOTE — Telephone Encounter (Addendum)
Patient with diagnosis of  PAF(paroxysmal atrial fibrillation) on Eliquis 5 mg twice daily   for anticoagulation.    Procedure: Colonoscope  Date of procedure: TBD   CHA2DS2-VASc Score = 8   This indicates a 10.8% annual risk of stroke. The patient's score is based upon: CHF History: 1 HTN History: 1 Diabetes History: 0 Stroke History: 2 Vascular Disease History: 1 (aortic atherosclerosis by CT) Age Score: 2 Gender Score: 1   SrCr : 1.08 (08/07/2021)  CrCl : 36 ml/min (Adjt wt) Platelet : 272 (08/07/2021)   Per office protocol, patient can hold Eliquis for 2 days prior to procedure.  Patient has hx of stroke and is at high risk off the anticoagulation. Please resume anticoagulation as soon as safely possible.  We will route to MD for final approval.      **This guidance is not considered finalized until pre-operative APP has relayed final recommendations.**

## 2022-03-25 NOTE — Telephone Encounter (Signed)
Im ok holding eliquis as recommended below  Zandra Abts MD

## 2022-03-26 DIAGNOSIS — I480 Paroxysmal atrial fibrillation: Secondary | ICD-10-CM | POA: Diagnosis not present

## 2022-03-26 NOTE — Telephone Encounter (Signed)
Please see below regarding clearance. Thanks!

## 2022-03-26 NOTE — Telephone Encounter (Signed)
   Patient Name: [name]  DOB: [birth year] MRN: [id number]  Primary Cardiologist: Carlyle Dolly, MD  Chart reviewed as part of pre-operative protocol coverage. Pre-op clearance already addressed by colleagues in earlier phone notes. To summarize recommendations:  -Per office protocol, patient can hold Eliquis for 2 days prior to procedure.  Patient has hx of stroke and is at high risk off the anticoagulation. Please resume anticoagulation as soon as safely possible.  -Dr. Harl Bowie agrees with recommendations  Will route this bundled recommendation to requesting provider via Epic fax function and remove from pre-op pool. Please call with questions.  Elgie Collard, PA-C 03/26/2022, 11:42 AM

## 2022-03-27 DIAGNOSIS — M62561 Muscle wasting and atrophy, not elsewhere classified, right lower leg: Secondary | ICD-10-CM | POA: Diagnosis not present

## 2022-03-27 DIAGNOSIS — M2569 Stiffness of other specified joint, not elsewhere classified: Secondary | ICD-10-CM | POA: Diagnosis not present

## 2022-03-27 DIAGNOSIS — M62551 Muscle wasting and atrophy, not elsewhere classified, right thigh: Secondary | ICD-10-CM | POA: Diagnosis not present

## 2022-03-27 DIAGNOSIS — M545 Low back pain, unspecified: Secondary | ICD-10-CM | POA: Diagnosis not present

## 2022-03-27 NOTE — Telephone Encounter (Signed)
Okay to schedule and hold Eliquis x48 hours.  Thank you

## 2022-03-28 NOTE — Telephone Encounter (Signed)
Will call pt to schedule once we have future schedule 

## 2022-03-31 ENCOUNTER — Telehealth: Payer: Self-pay | Admitting: *Deleted

## 2022-03-31 DIAGNOSIS — M2569 Stiffness of other specified joint, not elsewhere classified: Secondary | ICD-10-CM | POA: Diagnosis not present

## 2022-03-31 DIAGNOSIS — M62551 Muscle wasting and atrophy, not elsewhere classified, right thigh: Secondary | ICD-10-CM | POA: Diagnosis not present

## 2022-03-31 DIAGNOSIS — M545 Low back pain, unspecified: Secondary | ICD-10-CM | POA: Diagnosis not present

## 2022-03-31 DIAGNOSIS — M62561 Muscle wasting and atrophy, not elsewhere classified, right lower leg: Secondary | ICD-10-CM | POA: Diagnosis not present

## 2022-03-31 NOTE — Telephone Encounter (Signed)
Lmtrc to schedule TCS ASA 3 with dr.carver.

## 2022-04-03 DIAGNOSIS — M545 Low back pain, unspecified: Secondary | ICD-10-CM | POA: Diagnosis not present

## 2022-04-03 DIAGNOSIS — M62551 Muscle wasting and atrophy, not elsewhere classified, right thigh: Secondary | ICD-10-CM | POA: Diagnosis not present

## 2022-04-03 DIAGNOSIS — M2569 Stiffness of other specified joint, not elsewhere classified: Secondary | ICD-10-CM | POA: Diagnosis not present

## 2022-04-03 DIAGNOSIS — M62561 Muscle wasting and atrophy, not elsewhere classified, right lower leg: Secondary | ICD-10-CM | POA: Diagnosis not present

## 2022-04-07 DIAGNOSIS — M62551 Muscle wasting and atrophy, not elsewhere classified, right thigh: Secondary | ICD-10-CM | POA: Diagnosis not present

## 2022-04-07 DIAGNOSIS — M545 Low back pain, unspecified: Secondary | ICD-10-CM | POA: Diagnosis not present

## 2022-04-07 DIAGNOSIS — E782 Mixed hyperlipidemia: Secondary | ICD-10-CM | POA: Diagnosis not present

## 2022-04-07 DIAGNOSIS — N1831 Chronic kidney disease, stage 3a: Secondary | ICD-10-CM | POA: Diagnosis not present

## 2022-04-07 DIAGNOSIS — M62561 Muscle wasting and atrophy, not elsewhere classified, right lower leg: Secondary | ICD-10-CM | POA: Diagnosis not present

## 2022-04-07 DIAGNOSIS — R600 Localized edema: Secondary | ICD-10-CM | POA: Diagnosis not present

## 2022-04-07 DIAGNOSIS — M25551 Pain in right hip: Secondary | ICD-10-CM | POA: Diagnosis not present

## 2022-04-07 DIAGNOSIS — I5032 Chronic diastolic (congestive) heart failure: Secondary | ICD-10-CM | POA: Diagnosis not present

## 2022-04-07 DIAGNOSIS — K219 Gastro-esophageal reflux disease without esophagitis: Secondary | ICD-10-CM | POA: Diagnosis not present

## 2022-04-07 DIAGNOSIS — I10 Essential (primary) hypertension: Secondary | ICD-10-CM | POA: Diagnosis not present

## 2022-04-07 DIAGNOSIS — R062 Wheezing: Secondary | ICD-10-CM | POA: Diagnosis not present

## 2022-04-07 DIAGNOSIS — M2569 Stiffness of other specified joint, not elsewhere classified: Secondary | ICD-10-CM | POA: Diagnosis not present

## 2022-04-07 DIAGNOSIS — E039 Hypothyroidism, unspecified: Secondary | ICD-10-CM | POA: Diagnosis not present

## 2022-04-07 DIAGNOSIS — F03B3 Unspecified dementia, moderate, with mood disturbance: Secondary | ICD-10-CM | POA: Diagnosis not present

## 2022-04-07 DIAGNOSIS — R109 Unspecified abdominal pain: Secondary | ICD-10-CM | POA: Diagnosis not present

## 2022-04-10 DIAGNOSIS — M62551 Muscle wasting and atrophy, not elsewhere classified, right thigh: Secondary | ICD-10-CM | POA: Diagnosis not present

## 2022-04-10 DIAGNOSIS — M2569 Stiffness of other specified joint, not elsewhere classified: Secondary | ICD-10-CM | POA: Diagnosis not present

## 2022-04-10 DIAGNOSIS — M62561 Muscle wasting and atrophy, not elsewhere classified, right lower leg: Secondary | ICD-10-CM | POA: Diagnosis not present

## 2022-04-10 DIAGNOSIS — M545 Low back pain, unspecified: Secondary | ICD-10-CM | POA: Diagnosis not present

## 2022-04-14 DIAGNOSIS — M62551 Muscle wasting and atrophy, not elsewhere classified, right thigh: Secondary | ICD-10-CM | POA: Diagnosis not present

## 2022-04-14 DIAGNOSIS — M2569 Stiffness of other specified joint, not elsewhere classified: Secondary | ICD-10-CM | POA: Diagnosis not present

## 2022-04-14 DIAGNOSIS — M545 Low back pain, unspecified: Secondary | ICD-10-CM | POA: Diagnosis not present

## 2022-04-14 DIAGNOSIS — M62561 Muscle wasting and atrophy, not elsewhere classified, right lower leg: Secondary | ICD-10-CM | POA: Diagnosis not present

## 2022-04-16 DIAGNOSIS — M545 Low back pain, unspecified: Secondary | ICD-10-CM | POA: Diagnosis not present

## 2022-04-16 DIAGNOSIS — M2569 Stiffness of other specified joint, not elsewhere classified: Secondary | ICD-10-CM | POA: Diagnosis not present

## 2022-04-16 DIAGNOSIS — M62561 Muscle wasting and atrophy, not elsewhere classified, right lower leg: Secondary | ICD-10-CM | POA: Diagnosis not present

## 2022-04-16 DIAGNOSIS — M62551 Muscle wasting and atrophy, not elsewhere classified, right thigh: Secondary | ICD-10-CM | POA: Diagnosis not present

## 2022-04-20 ENCOUNTER — Ambulatory Visit
Admission: EM | Admit: 2022-04-20 | Discharge: 2022-04-20 | Disposition: A | Discharge status: Home / Self Care | Payer: PPO | Attending: Nurse Practitioner | Admitting: Nurse Practitioner

## 2022-04-20 DIAGNOSIS — M5441 Lumbago with sciatica, right side: Secondary | ICD-10-CM

## 2022-04-20 MED ORDER — DEXAMETHASONE SODIUM PHOSPHATE 10 MG/ML IJ SOLN
10.0000 mg | INTRAMUSCULAR | Status: AC
  Administered 2022-04-20: 10 mg via INTRAMUSCULAR

## 2022-04-20 NOTE — Discharge Instructions (Addendum)
You were given a steroid injection in our office today. Continue your pain management with Tylenol 500 mg or 650 mg, which she can pick up over-the-counter. Recommend elevating the lower extremities is much as possible.  As discussed, when you are in bed, recommend placing a pillow under your knees to help decrease swelling. Go to the emergency department immediately if you have worsening extremity pain, become unable to bear weight, or lose control of your bowel or bladder. Please follow-up with your primary care physician within the next 3 to 5 days if symptoms fail to improve.  Patient verbalizes understanding.  All questions were answered.  Patient is stable for discharge.

## 2022-04-20 NOTE — ED Provider Notes (Signed)
[facility]    CSN: [id number] Arrival date & time: 04/20/22  1356      History   Chief Complaint Chief Complaint  Patient presents with   Leg Pain    HPI [name] is a 86 y.o. female.   The history is provided by the patient.   Patient presents for complaints of right-sided low back pain that radiates into the right lower extremity.  Patient states symptoms have been present over the past couple of days.  She states that she has noticed some swelling in the right leg as well.  Patient denies any falls, new injury, or trauma.  She states that she has had surgery on her back previously.  She states that she normally has chronic pain, but states this feels worse.  She has not taken any medication for her symptoms.  Patient states that she does sit at her computer for several hours with her feet on the floor.  Patient ambulates with a cane at baseline.  She denies loss of bowel or bladder function or inability to bear weight.  Past Medical History:  Diagnosis Date   Adenomatous colon polyp 2008   Anxiety    Asthma    has not needed in over a year   Complication of anesthesia    pt reports TIA and sezures after surgery   Coronary artery disease    CVA (cerebral vascular accident) ([place])    x 3   Degenerative disc disease, lumbar    Depression    Diverticulosis    GERD (gastroesophageal reflux disease)    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60   age 52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 2 weeks   Hemorrhoids 2007   History of kidney stones    HTN (hypertension)    Hyperplastic colon polyp 2005   Hypothyroidism    Narcolepsy    PONV (postoperative nausea and vomiting)    TIA (transient ischemic attack)     Patient Active Problem List   Diagnosis Date Noted   Osteoporosis 01/07/2022   History of seizure 05/07/2021   Memory disturbance 05/07/2021   Gastroesophageal reflux disease without esophagitis 10/24/2020   Mixed hyperlipidemia 10/24/2020   Osteopenia 10/24/2020   Urge  incontinence 12/09/2019   Mixed incontinence urge and stress (female)(female) 08/05/2019   Renal stones 08/05/2019   UPJ obstruction, 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   Hydronephrosis with ureteropelvic junction (UPJ) obstruction 08/05/2019   Postmenopausal atrophic vaginitis 08/05/2019   Weak urinary stream 08/05/2019   Acquired thrombophilia ([place]) 06/16/2019   Lumbar spondylosis 02/15/2019   Paresthesia 12/17/2018   Chronic midline thoracic back pain 12/17/2018   Degeneration of lumbar intervertebral disc 12/15/2018   H/O: CVA (cerebrovascular accident) 08/25/2018   Depression, recurrent ([place]) 08/25/2018   T12 compression fracture, with delayed healing, subsequent encounter    Low back pain 08/19/2018   Constipation 08/19/2018   Chronic diastolic CHF (congestive heart failure) ([place]) 08/19/2018   Acquired hypothyroidism 08/19/2018   Intractable pain 08/19/2018   Essential hypertension 08/19/2018   T12 compression fracture ([place]) 08/18/2018   Breast cancer ([place]) 06/23/2016   Change in bowel habits 01/11/2015   Loss of weight 01/11/2015   Abdominal pain, chronic, right upper quadrant 01/11/2015   Unspecified chronic bronchitis ([place]) 09/25/2013   Other dysphagia 04/01/2013   Cough 01/20/2013   Thrush 10/28/2012   Chest pain 09/26/2012   H/O adenomatous polyp of colon 11/18/2011   Chronic anticoagulation 11/18/2011    Past Surgical History:  Procedure Laterality Date  ABDOMINAL HYSTERECTOMY     age 31, complicated by poor wound healing, followed by revision of scar and radiation for ?malignancy   BACK SURGERY  07/2018   lost 2 inches   BREAST ENHANCEMENT SURGERY     BREAST IMPLANT EXCHANGE Right 06/23/2016   Procedure: RIGHT BREAST IMPLANT REMOVAL AND REPLACEMENT;  Surgeon: Cristine Polio, MD;  Location: [facility];  Service: Plastics;  Laterality: Right;   COLONOSCOPY  2005   2 hyperplastic polyps   COLONOSCOPY  2007   Dr. Oneida Alar- hemorrhoids   COLONOSCOPY  2008    Dr. Reed Pandy polyp- rare sigmoid diverticulosis, internal hemorrhoids   COLONOSCOPY  12/01/2011   Procedure: COLONOSCOPY;  Surgeon: Danie Binder, MD;  [facility];  Service: Endoscopy;  Laterality: N/A;  12:30 PM   CYSTOSCOPY/URETEROSCOPY/HOLMIUM LASER/STENT PLACEMENT Left 02/29/2020   Procedure: CYSTOSCOPY/URETEROSCOPY/HOLMIUM LASER/STENT PLACEMENT;  Surgeon: Ceasar Mons, MD;  Location: [facility];  Service: Urology;  Laterality: Left;   epidural steroid injection     She is getting injections with Dr. Nelva Bush q3wks.   ESOPHAGOGASTRODUODENOSCOPY (EGD) WITH ESOPHAGEAL DILATION N/A 04/15/2013   Procedure: ESOPHAGOGASTRODUODENOSCOPY (EGD) WITH ESOPHAGEAL DILATION;  Surgeon: Danie Binder, MD;  [facility];  Service: Endoscopy;  Laterality: N/A;  11:45-moved to 12:30 Darius Bump to notify pt   EYE SURGERY  2010   EYE SURGERY     IR KYPHO LUMBAR INC FX REDUCE BONE BX UNI/BIL CANNULATION INC/IMAGING  08/23/2018   IR RADIOLOGIST EVAL & MGMT  12/23/2018   right thumb surgery      OB History     Gravida      Para      Term      Preterm      AB      Living  1      SAB      IAB      Ectopic      Multiple      Live Births               Home Medications    Prior to Admission medications   Medication Sig Start Date End Date Taking? Authorizing Provider  albuterol (PROVENTIL) (2.5 MG/3ML) 0.083% nebulizer solution Take 3 mLs (2.5 mg total) by nebulization every 6 (six) hours as needed for wheezing or shortness of breath. 09/08/18   Gerlene Fee, NP  albuterol (VENTOLIN HFA) 108 (90 Base) MCG/ACT inhaler Inhale 1-2 puffs into the lungs every 6 (six) hours as needed for wheezing or shortness of breath.    [provider]  Ascorbic Acid (VITAMIN C) 1000 MG tablet Take 1,000 mg by mouth daily.    [provider]  B Complex Vitamins (VITAMIN B COMPLEX) TABS Take 1 tablet by mouth daily.    [provider]  Biotin  300 MCG TABS Take 300 mcg by mouth daily after supper.     [provider]  Calcium Carb-Cholecalciferol (CALCIUM 600 + D PO) Take 1 tablet by mouth in the morning and at bedtime.    [provider]  ELIQUIS 5 MG TABS tablet Take 5 mg by mouth 2 (two) times daily. 08/19/21   [provider]  ezetimibe (ZETIA) 10 MG tablet Take 1 tablet (10 mg total) by mouth daily. 09/08/18   Gerlene Fee, NP  famotidine (PEPCID) 20 MG tablet Take 20 mg by mouth 2 (two) times daily. 08/19/21   [provider]  Ferrous Gluconate (IRON) 240 (  27 Fe) MG TABS Take 1 tablet by mouth daily. 09/08/18   Gerlene Fee, NP  Flaxseed, Linseed, (FLAXSEED OIL) 1000 MG CAPS Take 1,000 mg by mouth daily.     [provider]  flecainide (TAMBOCOR) 50 MG tablet TAKE 1 TABLET(50 MG) BY MOUTH TWICE DAILY 02/11/22   Evans Lance, MD  furosemide (LASIX) 40 MG tablet Take 1.5 tablets (60 mg total) by mouth daily. (May take an additional '20mg'$  as needed for swelling.) 02/28/22   Arnoldo Lenis, MD  Lutein 40 MG CAPS Take 40 mg by mouth daily.  08/24/18   [provider]  Magnesium 250 MG TABS Take 250 mg by mouth daily.     [provider]  memantine (NAMENDA) 5 MG tablet TAKE 2 TABLETS BY MOUTH TWICE DAILY 06/26/21   Suzzanne Cloud, NP  metoprolol tartrate (LOPRESSOR) 25 MG tablet Take 1 tablet (25 mg total) by mouth 2 (two) times daily. (MAY TAKE AN ADDITIONAL '25MG'$  AS NEEDED FOR PALPITATIONS) 02/21/21   Arnoldo Lenis, MD  Omega-3 Fatty Acids (FISH OIL) 1200 MG CAPS Take 1,200 mg by mouth daily.    [provider]  pantoprazole (PROTONIX) 40 MG tablet Take 1 tablet (40 mg total) by mouth daily. 12/18/18   Johnson, Clanford L, MD  potassium chloride (KLOR-CON M) 10 MEQ tablet Take 10 mEq by mouth 2 (two) times daily. 06/10/21   [provider]  Turmeric 500 MG TABS Take 500 mg by mouth daily.     [provider]  vitamin E 180 MG (400 UNITS)  capsule Take 400 Units by mouth daily.    [provider]  Zinc 50 MG TABS Take 50 mg by mouth daily.     [provider]    Family History Family History  Problem Relation Age of Onset   Colon cancer Father        age 51   Prostate cancer Father    Pancreatic cancer Mother        age 86    Social History Social History   Tobacco Use   Smoking status: Never    Passive exposure: Never   Smokeless tobacco: Never  Vaping Use   Vaping Use: Never used  Substance Use Topics   Alcohol use: No   Drug use: No     Allergies   Epinephrine, Hydrocodone-acetaminophen, Demerol [meperidine], and Vicodin [hydrocodone-acetaminophen]   Review of Systems Review of Systems Per HPI  Physical Exam Triage Vital Signs ED Triage Vitals [04/20/22 1425]  Enc Vitals Group     BP (!) 168/83     Pulse Rate 62     Resp 18     Temp (!) 97.2 F (36.2 C)     Temp Source Oral     SpO2 98 %     Weight      Height      Head Circumference      Peak Flow      Pain Score      Pain Loc      Pain Edu?      Excl. in [place]?    No data found.  Updated Vital Signs BP (!) 168/83 (BP Location: Right Arm)   Pulse 62   Temp (!) 97.2 F (36.2 C) (Oral)   Resp 18   SpO2 98%   Visual Acuity Right Eye Distance:   Left Eye Distance:   Bilateral Distance:    Right Eye Near:  Left Eye Near:    Bilateral Near:     Physical Exam Vitals and nursing note reviewed.  Constitutional:      General: She is not in acute distress.    Appearance: Normal appearance.  Eyes:     Extraocular Movements: Extraocular movements intact.     Pupils: Pupils are equal, round, and reactive to light.  Cardiovascular:     Pulses: Normal pulses.     Heart sounds: Normal heart sounds.  Pulmonary:     Effort: Pulmonary effort is normal.     Breath sounds: Normal breath sounds.  Abdominal:     General: Bowel sounds are normal.     Palpations: Abdomen is soft.  Musculoskeletal:     Lumbar back:  Tenderness present. No swelling or deformity. Decreased range of motion.     Right lower leg: Normal.  Skin:    General: Skin is warm and dry.  Neurological:     General: No focal deficit present.     Mental Status: She is alert and oriented to person, place, and time.  Psychiatric:        Mood and Affect: Mood normal.        Behavior: Behavior normal.      UC Treatments / Results  Labs (all labs ordered are listed, but only abnormal results are displayed) Labs Reviewed - No data to display  EKG   Radiology No results found.  Procedures Procedures (including critical care time)  Medications Ordered in UC Medications  dexamethasone (DECADRON) injection 10 mg (has no administration in time range)    Initial Impression / Assessment and Plan / UC Course  I have reviewed the triage vital signs and the nursing notes.  Pertinent labs & imaging results that were available during my care of the patient were reviewed by me and considered in my medical decision making (see chart for details).  Patient with right-sided low back pain that radiates into the right lower extremity.  Based on the patient's complaint and presentation, symptoms are consistent with a right-sided low back pain with sciatica.  Given the patient's age and probability of decreased renal function, will treat her with Decadron 10 mg IM in the clinic.  Patient was advised to continue pain management with Tylenol 500 mg or 650 mg as needed.  Patient was advised to follow-up with her primary care if symptoms do not improve.  Patient was given strict indications of when to go to the emergency department.  Patient verbalizes understanding.  All questions were answered.  Patient is stable for discharge. Final Clinical Impressions(s) / UC Diagnoses   Final diagnoses:  Right-sided low back pain with right-sided sciatica, unspecified chronicity   Discharge Instructions   None    ED Prescriptions   None    PDMP not  reviewed this encounter.   Tish Men, NP 04/20/22 [phone]

## 2022-04-20 NOTE — ED Triage Notes (Signed)
Pt reports hard, swelling and hot sensation in lower legs x 1 day.

## 2022-04-21 ENCOUNTER — Encounter: Payer: Self-pay | Admitting: *Deleted

## 2022-04-21 DIAGNOSIS — M62551 Muscle wasting and atrophy, not elsewhere classified, right thigh: Secondary | ICD-10-CM | POA: Diagnosis not present

## 2022-04-21 DIAGNOSIS — M545 Low back pain, unspecified: Secondary | ICD-10-CM | POA: Diagnosis not present

## 2022-04-21 DIAGNOSIS — M62561 Muscle wasting and atrophy, not elsewhere classified, right lower leg: Secondary | ICD-10-CM | POA: Diagnosis not present

## 2022-04-21 DIAGNOSIS — M2569 Stiffness of other specified joint, not elsewhere classified: Secondary | ICD-10-CM | POA: Diagnosis not present

## 2022-04-21 MED ORDER — PEG 3350-KCL-NA BICARB-NACL 420 G PO SOLR: 4000.0000 mL | Freq: Once | ORAL | 0 refills | Status: AC

## 2022-04-21 NOTE — Telephone Encounter (Signed)
Pt has been scheduled for 05/12/22 at 1 pm. Instructions mailed and prep sent to the pharmacy

## 2022-04-22 DIAGNOSIS — Q6239 Other obstructive defects of renal pelvis and ureter: Secondary | ICD-10-CM | POA: Diagnosis not present

## 2022-04-24 DIAGNOSIS — M2569 Stiffness of other specified joint, not elsewhere classified: Secondary | ICD-10-CM | POA: Diagnosis not present

## 2022-04-24 DIAGNOSIS — M545 Low back pain, unspecified: Secondary | ICD-10-CM | POA: Diagnosis not present

## 2022-04-24 DIAGNOSIS — M62551 Muscle wasting and atrophy, not elsewhere classified, right thigh: Secondary | ICD-10-CM | POA: Diagnosis not present

## 2022-04-24 DIAGNOSIS — M62561 Muscle wasting and atrophy, not elsewhere classified, right lower leg: Secondary | ICD-10-CM | POA: Diagnosis not present

## 2022-04-28 DIAGNOSIS — Q6239 Other obstructive defects of renal pelvis and ureter: Secondary | ICD-10-CM | POA: Diagnosis not present

## 2022-04-29 DIAGNOSIS — M25551 Pain in right hip: Secondary | ICD-10-CM | POA: Diagnosis not present

## 2022-04-29 DIAGNOSIS — N1831 Chronic kidney disease, stage 3a: Secondary | ICD-10-CM | POA: Diagnosis not present

## 2022-04-29 DIAGNOSIS — I5032 Chronic diastolic (congestive) heart failure: Secondary | ICD-10-CM | POA: Diagnosis not present

## 2022-04-29 DIAGNOSIS — M2569 Stiffness of other specified joint, not elsewhere classified: Secondary | ICD-10-CM | POA: Diagnosis not present

## 2022-04-29 DIAGNOSIS — I10 Essential (primary) hypertension: Secondary | ICD-10-CM | POA: Diagnosis not present

## 2022-04-29 DIAGNOSIS — M62551 Muscle wasting and atrophy, not elsewhere classified, right thigh: Secondary | ICD-10-CM | POA: Diagnosis not present

## 2022-04-29 DIAGNOSIS — M62561 Muscle wasting and atrophy, not elsewhere classified, right lower leg: Secondary | ICD-10-CM | POA: Diagnosis not present

## 2022-04-29 DIAGNOSIS — F03B3 Unspecified dementia, moderate, with mood disturbance: Secondary | ICD-10-CM | POA: Diagnosis not present

## 2022-04-29 DIAGNOSIS — M5441 Lumbago with sciatica, right side: Secondary | ICD-10-CM | POA: Diagnosis not present

## 2022-04-29 DIAGNOSIS — M545 Low back pain, unspecified: Secondary | ICD-10-CM | POA: Diagnosis not present

## 2022-04-29 DIAGNOSIS — R600 Localized edema: Secondary | ICD-10-CM | POA: Diagnosis not present

## 2022-05-01 DIAGNOSIS — M545 Low back pain, unspecified: Secondary | ICD-10-CM | POA: Diagnosis not present

## 2022-05-01 DIAGNOSIS — M62551 Muscle wasting and atrophy, not elsewhere classified, right thigh: Secondary | ICD-10-CM | POA: Diagnosis not present

## 2022-05-01 DIAGNOSIS — M62561 Muscle wasting and atrophy, not elsewhere classified, right lower leg: Secondary | ICD-10-CM | POA: Diagnosis not present

## 2022-05-01 DIAGNOSIS — M2569 Stiffness of other specified joint, not elsewhere classified: Secondary | ICD-10-CM | POA: Diagnosis not present

## 2022-05-05 DIAGNOSIS — M2569 Stiffness of other specified joint, not elsewhere classified: Secondary | ICD-10-CM | POA: Diagnosis not present

## 2022-05-05 DIAGNOSIS — M62551 Muscle wasting and atrophy, not elsewhere classified, right thigh: Secondary | ICD-10-CM | POA: Diagnosis not present

## 2022-05-05 DIAGNOSIS — M62561 Muscle wasting and atrophy, not elsewhere classified, right lower leg: Secondary | ICD-10-CM | POA: Diagnosis not present

## 2022-05-05 DIAGNOSIS — M545 Low back pain, unspecified: Secondary | ICD-10-CM | POA: Diagnosis not present

## 2022-05-07 ENCOUNTER — Telehealth: Payer: Self-pay

## 2022-05-07 NOTE — Telephone Encounter (Signed)
Pt called per message received from Dr Tanna Furry monitor notes.    Pt states her symptoms come and go, dizziness and lightheaded.  Her symptoms are intermittent, and HR goes up and down.  Pt wants to watchful wait, but educated on what to look out for.  Symptoms were discussed, and given [facility] telephone number to call us to schedule an appointment if symptoms worsen.  Pt understood, and promised to make [place] aware of any significant changes.     Pt understood monitor report, and pt chose to watchful wait.

## 2022-05-08 ENCOUNTER — Other Ambulatory Visit (HOSPITAL_COMMUNITY): Payer: PPO

## 2022-05-08 DIAGNOSIS — M62551 Muscle wasting and atrophy, not elsewhere classified, right thigh: Secondary | ICD-10-CM | POA: Diagnosis not present

## 2022-05-08 DIAGNOSIS — M62561 Muscle wasting and atrophy, not elsewhere classified, right lower leg: Secondary | ICD-10-CM | POA: Diagnosis not present

## 2022-05-08 DIAGNOSIS — M2569 Stiffness of other specified joint, not elsewhere classified: Secondary | ICD-10-CM | POA: Diagnosis not present

## 2022-05-08 DIAGNOSIS — M545 Low back pain, unspecified: Secondary | ICD-10-CM | POA: Diagnosis not present

## 2022-05-12 DIAGNOSIS — M62551 Muscle wasting and atrophy, not elsewhere classified, right thigh: Secondary | ICD-10-CM | POA: Diagnosis not present

## 2022-05-12 DIAGNOSIS — M2569 Stiffness of other specified joint, not elsewhere classified: Secondary | ICD-10-CM | POA: Diagnosis not present

## 2022-05-12 DIAGNOSIS — M545 Low back pain, unspecified: Secondary | ICD-10-CM | POA: Diagnosis not present

## 2022-05-12 DIAGNOSIS — M62561 Muscle wasting and atrophy, not elsewhere classified, right lower leg: Secondary | ICD-10-CM | POA: Diagnosis not present

## 2022-05-14 DIAGNOSIS — M62551 Muscle wasting and atrophy, not elsewhere classified, right thigh: Secondary | ICD-10-CM | POA: Diagnosis not present

## 2022-05-14 DIAGNOSIS — M545 Low back pain, unspecified: Secondary | ICD-10-CM | POA: Diagnosis not present

## 2022-05-14 DIAGNOSIS — M62561 Muscle wasting and atrophy, not elsewhere classified, right lower leg: Secondary | ICD-10-CM | POA: Diagnosis not present

## 2022-05-14 DIAGNOSIS — M2569 Stiffness of other specified joint, not elsewhere classified: Secondary | ICD-10-CM | POA: Diagnosis not present

## 2022-05-14 NOTE — Patient Instructions (Signed)
[name]  05/14/2022     '@PREFPERIOPPHARMACY'$ @   Your procedure is scheduled on  05/22/2022.   [facility] to [email or web address]  Call this number if you have problems the morning of surgery:  [phone]  If you experience any cold or flu symptoms such as cough, fever, chills, shortness of breath, etc. between now and your scheduled surgery, please notify us at the above number.   Remember:  Follow the diet and prep instructions given to you by the office.     Your last dose of iron should have been on 12/20 and your last dose of eliquis should be on 12/25.    Use your nebulizer and your inhaler before you come and bring your rescue inhaler with you.     Take these medicines the morning of surgery with A SIP OF WATER        pepcid, flecanide, levothyroxine, namenda, metoprolol, pantoprazole.     Do not wear jewelry, make-up or nail polish.  Do not wear lotions, powders, or perfumes, or deodorant.  Do not shave 48 hours prior to surgery.  Men may shave face and neck.  Do not bring valuables to the hospital.  [facility] is not responsible for any belongings or valuables.  Contacts, dentures or bridgework may not be worn into surgery.  Leave your suitcase in the car.  After surgery it may be brought to your room.  For patients admitted to the hospital, discharge time will be determined by your treatment team.  Patients discharged the day of surgery will not be allowed to drive home and must have someone with them for 24 hours.    Special instructions:   DO NOT smoke tobacco or vape for 24 hours before your procedure.   Please read over the following fact sheets that you were given. Anesthesia Post-op Instructions and Care and Recovery After Surgery      Colonoscopy, Adult, Care After The following information offers guidance on how to care for yourself after your procedure. Your health care provider may also give you more specific  instructions. If you have problems or questions, contact your health care provider. What can I expect after the procedure? After the procedure, it is common to have: A small amount of blood in your stool for 24 hours after the procedure. Some gas. Mild cramping or bloating of your abdomen. Follow these instructions at home: Eating and drinking  Drink enough fluid to keep your urine pale yellow. Follow instructions from your health care provider about eating or drinking restrictions. Resume your normal diet as told by your health care provider. Avoid heavy or fried foods that are hard to digest. Activity Rest as told by your health care provider. Avoid sitting for a long time without moving. Get up to take short walks every 1-2 hours. This is important to improve blood flow and breathing. Ask for help if you feel weak or unsteady. Return to your normal activities as told by your health care provider. Ask your health care provider what activities are safe for you. Managing cramping and bloating  Try walking around when you have cramps or feel bloated. If directed, apply heat to your abdomen as told by your health care provider. Use the heat source that your health care provider recommends, such as a moist heat pack or a heating pad. Place a towel between your skin and the heat source. Leave the heat on for  20-30 minutes. Remove the heat if your skin turns bright red. This is especially important if you are unable to feel pain, heat, or cold. You have a greater risk of getting burned. General instructions If you were given a sedative during the procedure, it can affect you for several hours. Do not drive or operate machinery until your health care provider says that it is safe. For the first 24 hours after the procedure: Do not sign important documents. Do not drink alcohol. Do your regular daily activities at a slower pace than normal. Eat soft foods that are easy to digest. Take  over-the-counter and prescription medicines only as told by your health care provider. Keep all follow-up visits. This is important. Contact a health care provider if: You have blood in your stool 2-3 days after the procedure. Get help right away if: You have more than a small spotting of blood in your stool. You have large blood clots in your stool. You have swelling of your abdomen. You have nausea or vomiting. You have a fever. You have increasing pain in your abdomen that is not relieved with medicine. These symptoms may be an emergency. Get help right away. Call 911. Do not wait to see if the symptoms will go away. Do not drive yourself to the hospital. Summary After the procedure, it is common to have a small amount of blood in your stool. You may also have mild cramping and bloating of your abdomen. If you were given a sedative during the procedure, it can affect you for several hours. Do not drive or operate machinery until your health care provider says that it is safe. Get help right away if you have a lot of blood in your stool, nausea or vomiting, a fever, or increased pain in your abdomen. This information is not intended to replace advice given to you by your health care provider. Make sure you discuss any questions you have with your health care provider. Document Revised: 01/02/2021 Document Reviewed: 01/02/2021 Elsevier Patient Education  [place] After The following information offers guidance on how to care for yourself after your procedure. Your health care provider may also give you more specific instructions. If you have problems or questions, contact your health care provider. What can I expect after the procedure? After the procedure, it is common to have: Tiredness. Little or no memory about what happened during or after the procedure. Impaired judgment when it comes to making decisions. Nausea or vomiting. Some trouble  with balance. Follow these instructions at home: For the time period you were told by your health care provider:  Rest. Do not participate in activities where you could fall or become injured. Do not drive or use machinery. Do not drink alcohol. Do not take sleeping pills or medicines that cause drowsiness. Do not make important decisions or sign legal documents. Do not take care of children on your own. Medicines Take over-the-counter and prescription medicines only as told by your health care provider. If you were prescribed antibiotics, take them as told by your health care provider. Do not stop using the antibiotic even if you start to feel better. Eating and drinking Follow instructions from your health care provider about what you may eat and drink. Drink enough fluid to keep your urine pale yellow. If you vomit: Drink clear fluids slowly and in small amounts as you are able. Clear fluids include water, ice chips, low-calorie sports drinks, and fruit juice that  has water added to it (diluted fruit juice). Eat light and bland foods in small amounts as you are able. These foods include bananas, applesauce, rice, lean meats, toast, and crackers. General instructions  Have a responsible adult stay with you for the time you are told. It is important to have someone help care for you until you are awake and alert. If you have sleep apnea, surgery and some medicines can increase your risk for breathing problems. Follow instructions from your health care provider about wearing your sleep device: When you are sleeping. This includes during daytime naps. While taking prescription pain medicines, sleeping medicines, or medicines that make you drowsy. Do not use any products that contain nicotine or tobacco. These products include cigarettes, chewing tobacco, and vaping devices, such as e-cigarettes. If you need help quitting, ask your health care provider. Contact a health care provider  if: You feel nauseous or vomit every time you eat or drink. You feel light-headed. You are still sleepy or having trouble with balance after 24 hours. You get a rash. You have a fever. You have redness or swelling around the IV site. Get help right away if: You have trouble breathing. You have new confusion after you get home. These symptoms may be an emergency. Get help right away. Call 911. Do not wait to see if the symptoms will go away. Do not drive yourself to the hospital. This information is not intended to replace advice given to you by your health care provider. Make sure you discuss any questions you have with your health care provider. Document Revised: 10/07/2021 Document Reviewed: 10/07/2021 Elsevier Patient Education  [place].

## 2022-05-14 NOTE — Progress Notes (Signed)
Patient: [name] Date of Birth: 1936-05-06  Reason for Visit: Follow up History from: Patient Primary Neurologist: [place]  ASSESSMENT AND PLAN 86 y.o. year old female   40.  History of seizures, not on medication 2.  History of cerebrovascular disease 3.  Memory disturbance 4.  Depression   -Doing fairly well other than the mood, MMSE 29/30 -Continue the Namenda 10 mg twice daily, we have held Aricept due to history of seizures -continue to work with primary care doctor for better management of your mood, I offered referral to psychiatry -recommend brain stimulating exercises  -see you back in 6 months or sooner if needed  HISTORY OF PRESENT ILLNESS: Today 05/15/22 MMSE 29/30 today.  Remains on Namenda 10 mg twice a day. Friend brought her, in exam room alone. Doesn't drive to [place]. Feels memory is slipping. Lives alone, does her own ADLs, manages her finances, cooks, does her own shopping. Chronic back pain is an issue. Uses cane. 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 Still periodic depression. Going through rough time, cries easy. Not on anything for her depression. Has close follow-up with PCP. Poor short term memory.  HISTORY  Dr. April Manson 11/05/21: Patient presents today for follow-up, she is accompanied by her friend.  Last visit was in December, at that time she was complaining of memory deficit but her MMSE was 27 out of 30.  She was continued on Namenda 10 mg twice daily.  Today she is reporting memory decline but also states that her mood is very low, she saw her PMD yesterday and was started on bupropion yesterday.  She has been on this medication in the past for depression.  She still complains of confusion, memory decline but again this may be related to her depression.  She still going to PT once a week but does not do a lot of exercise.  She still lives alone, independent, still drives, denies being lost in family or  places and no recent accident.   REVIEW OF SYSTEMS: Out of a complete 14 system review of symptoms, the patient complains only of the following symptoms, and all other reviewed systems are negative.  See HPI  ALLERGIES: Allergies  Allergen Reactions   Epinephrine Anaphylaxis   Demerol [Meperidine] Other (See Comments)    headaches   Vicodin [Hydrocodone-Acetaminophen] Other (See Comments)    [name] it makes her crazy. Pt tolerates acetaminophen    HOME MEDICATIONS: Outpatient Medications Prior to Visit  Medication Sig Dispense Refill   albuterol (PROVENTIL) (2.5 MG/3ML) 0.083% nebulizer solution Take 3 mLs (2.5 mg total) by nebulization every 6 (six) hours as needed for wheezing or shortness of breath. 75 mL 0   albuterol (VENTOLIN HFA) 108 (90 Base) MCG/ACT inhaler Inhale 1-2 puffs into the lungs every 6 (six) hours as needed for wheezing or shortness of breath.     Ascorbic Acid (VITAMIN C) 1000 MG tablet Take 1,000 mg by mouth daily.     B Complex Vitamins (VITAMIN B COMPLEX) TABS Take 1 tablet by mouth daily.     Biotin 300 MCG TABS Take 300 mcg by mouth daily after supper.      Calcium Carb-Cholecalciferol (CALCIUM 600 + D PO) Take 1 tablet by mouth in the morning and at bedtime.     COLLAGEN PO Take 1 capsule by mouth daily.     ELIQUIS 5 MG TABS tablet Take 5 mg by mouth 2 (two) times daily.  ezetimibe (ZETIA) 10 MG tablet Take 1 tablet (10 mg total) by mouth daily. 30 tablet 0   famotidine (PEPCID) 20 MG tablet Take 20 mg by mouth daily.     Ferrous Gluconate (IRON) 240 (27 Fe) MG TABS Take 1 tablet by mouth daily. 30 tablet 0   Flaxseed, Linseed, (FLAXSEED OIL) 1000 MG CAPS Take 1,000 mg by mouth daily.      flecainide (TAMBOCOR) 50 MG tablet TAKE 1 TABLET(50 MG) BY MOUTH TWICE DAILY 180 tablet 3   furosemide (LASIX) 40 MG tablet Take 1.5 tablets (60 mg total) by mouth daily. (May take an additional '20mg'$  as needed for swelling.) 60 tablet 6   levothyroxine (SYNTHROID) 75  MCG tablet Take 75 mcg by mouth daily before breakfast.     losartan (COZAAR) 25 MG tablet Take 25 mg by mouth daily.     Lutein 40 MG CAPS Take 40 mg by mouth daily.      Magnesium 250 MG TABS Take 250 mg by mouth daily.      memantine (NAMENDA) 5 MG tablet TAKE 2 TABLETS BY MOUTH TWICE DAILY 120 tablet 2   metoprolol tartrate (LOPRESSOR) 25 MG tablet Take 1 tablet (25 mg total) by mouth 2 (two) times daily. (MAY TAKE AN ADDITIONAL '25MG'$  AS NEEDED FOR PALPITATIONS) 90 tablet 6   Omega-3 Fatty Acids (FISH OIL) 1200 MG CAPS Take 1,200 mg by mouth daily.     pantoprazole (PROTONIX) 40 MG tablet Take 1 tablet (40 mg total) by mouth daily.     potassium chloride (KLOR-CON M) 10 MEQ tablet Take 10 mEq by mouth 2 (two) times daily.     Turmeric 500 MG TABS Take 500 mg by mouth daily.      vitamin E 180 MG (400 UNITS) capsule Take 400 Units by mouth daily.     Zinc 50 MG TABS Take 50 mg by mouth daily.      No facility-administered medications prior to visit.    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   Hemorrhoids 2007   History of kidney stones    HTN (hypertension)    Hyperplastic colon polyp 2005   Hypothyroidism    Narcolepsy    PONV (postoperative nausea and vomiting)    TIA (transient ischemic attack)     PAST SURGICAL HISTORY: Past Surgical History:  Procedure Laterality Date   ABDOMINAL HYSTERECTOMY     age 86, complicated by poor wound healing, followed by revision of scar and radiation for ?malignancy   BACK SURGERY  07/2018   lost 2 inches   BREAST  ENHANCEMENT SURGERY     BREAST IMPLANT EXCHANGE Right 06/23/2016   Procedure: RIGHT BREAST IMPLANT REMOVAL AND REPLACEMENT;  Surgeon: Cristine Polio, MD;  Location: [facility];  Service: Plastics;  Laterality: Right;   COLONOSCOPY  2005   2 hyperplastic polyps   COLONOSCOPY  2007   Dr. Oneida Alar- hemorrhoids  COLONOSCOPY  2008   Dr. Reed Pandy polyp- rare sigmoid diverticulosis, internal hemorrhoids   COLONOSCOPY  12/01/2011   Procedure: COLONOSCOPY;  Surgeon: Danie Binder, MD;  [facility];  Service: Endoscopy;  Laterality: N/A;  12:30 PM   CYSTOSCOPY/URETEROSCOPY/HOLMIUM LASER/STENT PLACEMENT Left 02/29/2020   Procedure: CYSTOSCOPY/URETEROSCOPY/HOLMIUM LASER/STENT PLACEMENT;  Surgeon: Ceasar Mons, MD;  Location: [facility];  Service: Urology;  Laterality: Left;   epidural steroid injection     She is getting injections with Dr. Nelva Bush q3wks.   ESOPHAGOGASTRODUODENOSCOPY (EGD) WITH ESOPHAGEAL DILATION N/A 04/15/2013   Procedure: ESOPHAGOGASTRODUODENOSCOPY (EGD) WITH ESOPHAGEAL DILATION;  Surgeon: Danie Binder, MD;  [facility];  Service: Endoscopy;  Laterality: N/A;  11:45-moved to 12:30 Darius Bump to notify pt   EYE SURGERY  2010   EYE SURGERY     IR KYPHO LUMBAR INC FX REDUCE BONE BX UNI/BIL CANNULATION INC/IMAGING  08/23/2018   IR RADIOLOGIST EVAL & MGMT  12/23/2018   right thumb surgery      FAMILY HISTORY: Family History  Problem Relation Age of Onset   Colon cancer Father        age 79   Prostate cancer Father    Pancreatic cancer Mother        age 46    SOCIAL HISTORY: Social History   Socioeconomic History   Marital status: Widowed    Spouse name: Not on file   Number of children: 1   Years of education: 74   Highest education level: 12th grade  Occupational History   Not on file  Tobacco Use   Smoking status: Never    Passive exposure: Never   Smokeless tobacco: Never  Vaping Use   Vaping Use: Never used   Substance and Sexual Activity   Alcohol use: No   Drug use: No   Sexual activity: Not Currently    Partners: Male  Other Topics Concern   Not on file  Social History Narrative   Lives alone   Right Handed    Drinks no caffeine daily   Social Determinants of Health   Financial Resource Strain: Low Risk  (02/06/2022)   Overall Financial Resource Strain (CARDIA)    Difficulty of Paying Living Expenses: Not hard at all  Food Insecurity: No Food Insecurity (02/06/2022)   Hunger Vital Sign    Worried About Running Out of Food in the Last Year: Never true    Ran Out of Food in the Last Year: Never true  Transportation Needs: No Transportation Needs (02/06/2022)   PRAPARE - Hydrologist (Medical): No    Lack of Transportation (Non-Medical): No  Physical Activity: Inactive (02/06/2022)   Exercise Vital Sign    Days of Exercise per Week: 0 days    Minutes of Exercise per Session: 0 min  Stress: No Stress Concern Present (02/06/2022)   [place]    Feeling of Stress : Only a little  Social Connections: Moderately Integrated (02/06/2022)   Social Connection and Isolation Panel [NHANES]    Frequency of Communication with Friends and Family: More than three times a week    Frequency of Social Gatherings with Friends and Family: More than three times a week    Attends Religious Services: More than 4 times per year    Active Member of [employer] or Organizations: Yes    Attends Archivist Meetings: More than 4 times per year  Marital Status: Widowed  Intimate Partner Violence: Not At Risk (02/06/2022)   Humiliation, Afraid, Rape, and Kick questionnaire    Fear of Current or Ex-Partner: No    Emotionally Abused: No    Physically Abused: No    Sexually Abused: No    PHYSICAL EXAM  Vitals:   05/15/22 0930 05/15/22 0941  BP: (!) 157/115 (!) 149/64  Pulse: 66 (!) 51  Weight: 138 lb (62.6  kg)   Height: 5' (1.524 m)    Body mass index is 26.95 kg/m.    05/15/2022    9:32 AM 11/05/2021   10:25 AM 05/07/2021   11:07 AM  MMSE - Mini Mental State Exam  Orientation to time '5 4 4  '$ Orientation to Place '5 5 4  '$ Registration '3 3 3  '$ Attention/ Calculation '4 5 4  '$ Recall '3 2 3  '$ Language- name 2 objects '2 2 2  '$ Language- repeat '1 1 1  '$ Language- follow 3 step command '3 3 3  '$ Language- read & follow direction '1 1 1  '$ Write a sentence '1 1 1  '$ Copy design 1 0 1  Total score '29 27 27    '$ Generalized: Well developed, in no acute distress, depressed elderly female Neurological examination  Mentation: Alert oriented to time, place, history taking. Follows all commands, mood is flat, speech is slow Cranial nerve II-XII: Pupils were equal round reactive to light. Extraocular movements were full, visual field were full on confrontational test. Facial sensation and strength were normal.  Head turning and shoulder shrug  were normal and symmetric. Motor: 4/5 right hip flexion Sensory: Sensory testing is intact to soft touch on all 4 extremities. No evidence of extinction is noted.  Coordination: Cerebellar testing reveals good finger-nose-finger and heel-to-shin bilaterally.  Gait and station: Gait is wide-based, cautious, antalgic on the right, uses single-point cane Reflexes: Deep tendon reflexes are symmetric and normal bilaterally.   DIAGNOSTIC DATA (LABS, IMAGING, TESTING) - I reviewed patient records, labs, notes, testing and imaging myself where available.  Lab Results  Component Value Date   WBC 9.3 08/07/2021   HGB 13.7 08/07/2021   HCT 42.8 08/07/2021   MCV 92.6 08/07/2021   PLT 272 08/07/2021      Component Value Date/Time   NA 142 08/07/2021 0729   K 3.4 (L) 08/07/2021 0729   CL 105 08/07/2021 0729   CO2 24 08/07/2021 0729   GLUCOSE 142 (H) 08/07/2021 0729   BUN 36 (H) 08/07/2021 0729   CREATININE 1.08 (H) 08/07/2021 0729   CREATININE 1.00 (H) 01/11/2015 0954    CALCIUM 8.9 08/07/2021 0729   PROT 6.9 01/29/2021 0910   ALBUMIN 4.1 01/29/2021 0910   AST 26 01/29/2021 0910   ALT 21 01/29/2021 0910   ALKPHOS 58 01/29/2021 0910   BILITOT 0.6 01/29/2021 0910   GFRNONAA 51 (L) 08/07/2021 0729   GFRAA 52 (L) 02/27/2020 1107   Lab Results  Component Value Date   CHOL 220 (H) 12/17/2018   HDL 60 12/17/2018   LDLCALC 131 (H) 12/17/2018   TRIG 146 12/17/2018   CHOLHDL 3.7 12/17/2018   Lab Results  Component Value Date   HGBA1C  01/04/2009    5.6 (NOTE) The ADA recommends the following therapeutic goal for glycemic control related to Hgb A1c measurement: Goal of therapy: <6.5 Hgb A1c  Reference: American Diabetes Association: Clinical Practice Recommendations 2010, Diabetes Care, 2010, 33: (Suppl  1).   Lab Results  Component Value Date   VITAMINB12 1,895 (H)  10/16/2020   Lab Results  Component Value Date   TSH 3.716 01/29/2021    Butler Denmark, AGNP-C, DNP 05/15/2022, 9:44 AM [facility] [address], [place], [place] [address] [phone]

## 2022-05-15 ENCOUNTER — Encounter: Payer: Self-pay | Admitting: Neurology

## 2022-05-15 ENCOUNTER — Ambulatory Visit (INDEPENDENT_AMBULATORY_CARE_PROVIDER_SITE_OTHER): Payer: PPO | Admitting: Neurology

## 2022-05-15 VITALS — BP 149/64 | HR 51 | Ht 60.0 in | Wt 138.0 lb

## 2022-05-15 DIAGNOSIS — R413 Other amnesia: Secondary | ICD-10-CM

## 2022-05-15 DIAGNOSIS — F339 Major depressive disorder, recurrent, unspecified: Secondary | ICD-10-CM | POA: Diagnosis not present

## 2022-05-15 MED ORDER — MEMANTINE HCL 10 MG PO TABS: 10.0000 mg | ORAL_TABLET | Freq: Two times a day (BID) | ORAL | 3 refills | Status: DC

## 2022-05-15 NOTE — Patient Instructions (Addendum)
We will continue the Namenda 10 mg twice daily  Continue to see your primary care doctor Your memory score looks good today 29/30 Recommend physical exercise, brain stimulating activity

## 2022-05-16 ENCOUNTER — Encounter (HOSPITAL_COMMUNITY): Payer: Self-pay

## 2022-05-16 ENCOUNTER — Encounter (HOSPITAL_COMMUNITY): Payer: PPO

## 2022-05-16 ENCOUNTER — Encounter (HOSPITAL_COMMUNITY)
Admission: RE | Admit: 2022-05-16 | Discharge: 2022-05-16 | Disposition: A | Discharge status: Home / Self Care | Payer: PPO | Source: Ambulatory Visit | Attending: Internal Medicine | Admitting: Internal Medicine

## 2022-05-16 DIAGNOSIS — Z79899 Other long term (current) drug therapy: Secondary | ICD-10-CM | POA: Insufficient documentation

## 2022-05-16 DIAGNOSIS — D6869 Other thrombophilia: Secondary | ICD-10-CM | POA: Diagnosis not present

## 2022-05-16 DIAGNOSIS — Z01812 Encounter for preprocedural laboratory examination: Secondary | ICD-10-CM | POA: Diagnosis not present

## 2022-05-16 HISTORY — DX: Cardiac arrhythmia, unspecified: I49.9

## 2022-05-16 LAB — BASIC METABOLIC PANEL
Anion gap: 12 (ref 5–15)
BUN: 32 mg/dL — ABNORMAL HIGH (ref 8–23)
CO2: 32 mmol/L (ref 22–32)
Calcium: 10.2 mg/dL (ref 8.9–10.3)
Chloride: 99 mmol/L (ref 98–111)
Creatinine, Ser: 1.77 mg/dL — ABNORMAL HIGH (ref 0.44–1.00)
GFR, Estimated: 28 mL/min — ABNORMAL LOW (ref >60)
Glucose, Bld: 101 mg/dL — ABNORMAL HIGH (ref 70–99)
Potassium: 4 mmol/L (ref 3.5–5.1)
Sodium: 143 mmol/L (ref 135–145)

## 2022-05-16 LAB — CBC WITH DIFFERENTIAL/PLATELET
Abs Immature Granulocytes: 0.01 10*3/uL (ref 0.00–0.07)
Basophils Absolute: 0.1 10*3/uL (ref 0.0–0.1)
Basophils Relative: 1 %
Eosinophils Absolute: 0.4 10*3/uL (ref 0.0–0.5)
Eosinophils Relative: 5 %
HCT: 45 % (ref 36.0–46.0)
Hemoglobin: 14.5 g/dL (ref 12.0–15.0)
Immature Granulocytes: 0 %
Lymphocytes Relative: 28 %
Lymphs Abs: 2 10*3/uL (ref 0.7–4.0)
MCH: 30.1 pg (ref 26.0–34.0)
MCHC: 32.2 g/dL (ref 30.0–36.0)
MCV: 93.4 fL (ref 80.0–100.0)
Monocytes Absolute: 0.7 10*3/uL (ref 0.1–1.0)
Monocytes Relative: 11 %
Neutro Abs: 3.9 10*3/uL (ref 1.7–7.7)
Neutrophils Relative %: 55 %
Platelets: 254 10*3/uL (ref 150–400)
RBC: 4.82 MIL/uL (ref 3.87–5.11)
RDW: 14.4 % (ref 11.5–15.5)
WBC: 7.1 10*3/uL (ref 4.0–10.5)
nRBC: 0 % (ref 0.0–0.2)

## 2022-05-20 DIAGNOSIS — M2569 Stiffness of other specified joint, not elsewhere classified: Secondary | ICD-10-CM | POA: Diagnosis not present

## 2022-05-20 DIAGNOSIS — M545 Low back pain, unspecified: Secondary | ICD-10-CM | POA: Diagnosis not present

## 2022-05-20 DIAGNOSIS — M62551 Muscle wasting and atrophy, not elsewhere classified, right thigh: Secondary | ICD-10-CM | POA: Diagnosis not present

## 2022-05-20 DIAGNOSIS — M62561 Muscle wasting and atrophy, not elsewhere classified, right lower leg: Secondary | ICD-10-CM | POA: Diagnosis not present

## 2022-05-22 ENCOUNTER — Ambulatory Visit (HOSPITAL_COMMUNITY)
Admission: RE | Admit: 2022-05-22 | Discharge: 2022-05-22 | Disposition: A | Discharge status: Home / Self Care | Payer: PPO | Source: Ambulatory Visit | Attending: Internal Medicine | Admitting: Internal Medicine

## 2022-05-22 ENCOUNTER — Encounter (HOSPITAL_COMMUNITY): Payer: Self-pay

## 2022-05-22 ENCOUNTER — Ambulatory Visit (HOSPITAL_COMMUNITY): Payer: PPO | Admitting: Anesthesiology

## 2022-05-22 ENCOUNTER — Other Ambulatory Visit: Payer: Self-pay

## 2022-05-22 ENCOUNTER — Encounter (HOSPITAL_COMMUNITY)
Admission: RE | Disposition: A | Discharge status: Home / Self Care | Payer: Self-pay | Source: Ambulatory Visit | Attending: Internal Medicine

## 2022-05-22 ENCOUNTER — Ambulatory Visit (HOSPITAL_BASED_OUTPATIENT_CLINIC_OR_DEPARTMENT_OTHER): Payer: PPO | Admitting: Anesthesiology

## 2022-05-22 DIAGNOSIS — I69351 Hemiplegia and hemiparesis following cerebral infarction affecting right dominant side: Secondary | ICD-10-CM | POA: Diagnosis not present

## 2022-05-22 DIAGNOSIS — J45909 Unspecified asthma, uncomplicated: Secondary | ICD-10-CM | POA: Diagnosis not present

## 2022-05-22 DIAGNOSIS — R1032 Left lower quadrant pain: Secondary | ICD-10-CM | POA: Diagnosis not present

## 2022-05-22 DIAGNOSIS — E039 Hypothyroidism, unspecified: Secondary | ICD-10-CM | POA: Diagnosis not present

## 2022-05-22 DIAGNOSIS — I2510 Atherosclerotic heart disease of native coronary artery without angina pectoris: Secondary | ICD-10-CM | POA: Insufficient documentation

## 2022-05-22 DIAGNOSIS — Z86010 Personal history of colonic polyps: Secondary | ICD-10-CM

## 2022-05-22 DIAGNOSIS — Z800 Family history of malignant neoplasm of digestive organs: Secondary | ICD-10-CM | POA: Diagnosis not present

## 2022-05-22 DIAGNOSIS — Z79899 Other long term (current) drug therapy: Secondary | ICD-10-CM | POA: Diagnosis not present

## 2022-05-22 DIAGNOSIS — K5730 Diverticulosis of large intestine without perforation or abscess without bleeding: Secondary | ICD-10-CM | POA: Diagnosis not present

## 2022-05-22 DIAGNOSIS — D120 Benign neoplasm of cecum: Secondary | ICD-10-CM | POA: Diagnosis not present

## 2022-05-22 DIAGNOSIS — K219 Gastro-esophageal reflux disease without esophagitis: Secondary | ICD-10-CM | POA: Insufficient documentation

## 2022-05-22 DIAGNOSIS — I4891 Unspecified atrial fibrillation: Secondary | ICD-10-CM | POA: Diagnosis not present

## 2022-05-22 DIAGNOSIS — K648 Other hemorrhoids: Secondary | ICD-10-CM | POA: Insufficient documentation

## 2022-05-22 DIAGNOSIS — G8929 Other chronic pain: Secondary | ICD-10-CM

## 2022-05-22 DIAGNOSIS — D128 Benign neoplasm of rectum: Secondary | ICD-10-CM

## 2022-05-22 DIAGNOSIS — I509 Heart failure, unspecified: Secondary | ICD-10-CM | POA: Insufficient documentation

## 2022-05-22 DIAGNOSIS — K625 Hemorrhage of anus and rectum: Secondary | ICD-10-CM | POA: Diagnosis not present

## 2022-05-22 DIAGNOSIS — B370 Candidal stomatitis: Secondary | ICD-10-CM

## 2022-05-22 DIAGNOSIS — I110 Hypertensive heart disease with heart failure: Secondary | ICD-10-CM | POA: Diagnosis not present

## 2022-05-22 DIAGNOSIS — K635 Polyp of colon: Secondary | ICD-10-CM | POA: Diagnosis not present

## 2022-05-22 DIAGNOSIS — I10 Essential (primary) hypertension: Secondary | ICD-10-CM | POA: Diagnosis not present

## 2022-05-22 HISTORY — PX: COLONOSCOPY WITH PROPOFOL: SHX5780

## 2022-05-22 HISTORY — PX: POLYPECTOMY: SHX5525

## 2022-05-22 SURGERY — COLONOSCOPY WITH PROPOFOL
Anesthesia: General

## 2022-05-22 MED ORDER — LACTATED RINGERS IV SOLN
INTRAVENOUS | Status: DC
  Administered 2022-05-22: 1000 mL via INTRAVENOUS

## 2022-05-22 MED ORDER — LIDOCAINE HCL (CARDIAC) PF 100 MG/5ML IV SOSY
PREFILLED_SYRINGE | INTRAVENOUS | Status: DC | PRN
  Administered 2022-05-22: 50 mg via INTRAVENOUS

## 2022-05-22 MED ORDER — PROPOFOL 10 MG/ML IV BOLUS
INTRAVENOUS | Status: DC | PRN
  Administered 2022-05-22 (×2): 30 mg via INTRAVENOUS
  Administered 2022-05-22: 70 mg via INTRAVENOUS
  Administered 2022-05-22: 30 mg via INTRAVENOUS
  Administered 2022-05-22: 20 mg via INTRAVENOUS

## 2022-05-22 NOTE — Anesthesia Postprocedure Evaluation (Signed)
Anesthesia Post Note  Patient: [name]  Procedure(s) Performed: COLONOSCOPY WITH PROPOFOL POLYPECTOMY  Patient location during evaluation: Phase II Anesthesia Type: General Level of consciousness: awake and alert and oriented Pain management: pain level controlled Vital Signs Assessment: post-procedure vital signs reviewed and stable Respiratory status: spontaneous breathing, nonlabored ventilation and respiratory function stable Cardiovascular status: blood pressure returned to baseline and stable Postop Assessment: no apparent nausea or vomiting Anesthetic complications: no  No notable events documented.   Last Vitals:  Vitals:   05/22/22 0700 05/22/22 0827  BP: (!) 150/59 (!) 90/39  Pulse: (!) 55 61  Resp: (!) 22 20  Temp: 36.6 C 36.6 C  SpO2: 100% 97%    Last Pain:  Vitals:   05/22/22 0827  TempSrc: Oral  PainSc: 7                  Icelyn Navarrete C Bret Stamour

## 2022-05-22 NOTE — Op Note (Signed)
[facility] Patient Name: [name] Procedure Date: 05/22/2022 7:56 AM MRN: [id number] Date of Birth: Aug 25, 1935 Attending MD: Elon Alas. Abbey Chatters , [place], [id number] CSN: [id number] Age: 86 Admit Type: Outpatient Procedure:                Colonoscopy Indications:              Abdominal pain in the left lower quadrant, Rectal                            bleeding Providers:                Elon Alas. Abbey Chatters, DO, Charlsie Quest. Theda Sers RN, RN,                            Caprice Kluver, Ladoris Gene Technician, Technician Referring MD:              Medicines:                See the Anesthesia note for documentation of the                            administered medications Complications:            No immediate complications. Estimated Blood Loss:     Estimated blood loss was minimal. Procedure:                Pre-Anesthesia Assessment:                           - The anesthesia plan was to use monitored                            anesthesia care (MAC).                           After obtaining informed consent, the colonoscope                            was passed under direct vision. Throughout the                            procedure, the patient's blood pressure, pulse, and                            oxygen saturations were monitored continuously. The                            [id number]-HQ190L ([id number]) scope was introduced through                            the anus and advanced to the the cecum, identified                            by appendiceal orifice and ileocecal valve. The                            colonoscopy was  performed without difficulty. The                            patient tolerated the procedure well. The quality                            of the bowel preparation was evaluated using the                            BBPS [facility] Bowel Preparation Scale) with scores                            of: Right Colon = 3, Transverse Colon = 3 and Left                             Colon = 3 (entire mucosa seen well with no residual                            staining, small fragments of stool or opaque                            liquid). The total BBPS score equals 9. Scope In: 8:07:18 AM Scope Out: 8:23:19 AM Scope Withdrawal Time: 0 hours 10 minutes 6 seconds  Total Procedure Duration: 0 hours 16 minutes 1 second  Findings:      The perianal and digital rectal examinations were normal.      Non-bleeding internal hemorrhoids were found during endoscopy.      Multiple large-mouthed and small-mouthed diverticula were found in the       sigmoid colon.      A 6 mm polyp was found in the cecum. The polyp was sessile. The polyp       was removed with a cold snare. Resection and retrieval were complete.      An 8 mm polyp was found in the rectum. The polyp was sessile. The polyp       was removed with a cold snare. Resection and retrieval were complete.      The exam was otherwise without abnormality. Impression:               - Non-bleeding internal hemorrhoids.                           - Diverticulosis in the sigmoid colon.                           - One 6 mm polyp in the cecum, removed with a cold                            snare. Resected and retrieved.                           - One 8 mm polyp in the rectum, removed with a cold                            snare. Resected and  retrieved.                           - The examination was otherwise normal. Moderate Sedation:      Per Anesthesia Care Recommendation:           - Patient has a contact number available for                            emergencies. The signs and symptoms of potential                            delayed complications were discussed with the                            patient. Return to normal activities tomorrow.                            Written discharge instructions were provided to the                            patient.                           - Resume previous diet.                            - Continue present medications.                           - Await pathology results.                           - No repeat colonoscopy due to age.                           - Return to GI clinic in 3 months.                           - Discuss hemorrhoid banding if bleeding continues                           - okay to resume Eliquis immediately Procedure Code(s):        --- Professional ---                           [phone], Colonoscopy, flexible; with removal of                            tumor(s), polyp(s), or other lesion(s) by snare                            technique Diagnosis Code(s):        --- Professional ---                           F68.1, Other hemorrhoids  D12.0, Benign neoplasm of cecum                           D12.8, Benign neoplasm of rectum                           R10.32, Left lower quadrant pain                           K62.5, Hemorrhage of anus and rectum                           K57.30, Diverticulosis of large intestine without                            perforation or abscess without bleeding CPT copyright 2022 American Medical Association. All rights reserved. The codes documented in this report are preliminary and upon coder review may  be revised to meet current compliance requirements. Elon Alas. Abbey Chatters, DO [place] Abbey Chatters, DO 05/22/2022 8:26:26 AM This report has been signed electronically. Number of Addenda: 0

## 2022-05-22 NOTE — H&P (Signed)
Primary Care Physician:  Celene Squibb, MD Primary Gastroenterologist:  Dr. Abbey Chatters  Pre-Procedure History & Physical: HPI:  [name] is a 86 y.o. female is here for a colonoscopy to be performed for LLQ abdominal pain and rectal bleeding.   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44   age 53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 2 weeks   Hemorrhoids 2007   History of kidney stones    HTN (hypertension)    Hyperplastic colon polyp 2005   Hypothyroidism    Narcolepsy    PONV (postoperative nausea and vomiting)    TIA (transient ischemic attack)     Past Surgical History:  Procedure Laterality Date   ABDOMINAL HYSTERECTOMY     age 56, complicated by poor wound healing, followed by revision of scar and radiation for ?malignancy   BACK SURGERY  07/2018   lost 2 inches   BREAST ENHANCEMENT SURGERY     BREAST IMPLANT EXCHANGE Right 06/23/2016   Procedure: RIGHT BREAST IMPLANT REMOVAL AND REPLACEMENT;  Surgeon: Cristine Polio, MD;  Location: [facility];  Service: Plastics;  Laterality: Right;   COLONOSCOPY  2005   2 hyperplastic polyps   COLONOSCOPY  2007   Dr. Oneida Alar- hemorrhoids   COLONOSCOPY  2008   Dr. Reed Pandy polyp- rare sigmoid diverticulosis, internal hemorrhoids   COLONOSCOPY  12/01/2011   Procedure: COLONOSCOPY;  Surgeon: Danie Binder, MD;  Location: [facility];  Service: Endoscopy;  Laterality: N/A;  12:30 PM   CYSTOSCOPY/URETEROSCOPY/HOLMIUM LASER/STENT PLACEMENT Left 02/29/2020   Procedure: CYSTOSCOPY/URETEROSCOPY/HOLMIUM LASER/STENT PLACEMENT;  Surgeon: Ceasar Mons, MD;  Location: [facility];  Service: Urology;  Laterality: Left;   epidural steroid injection     She is getting injections with Dr. Nelva Bush q3wks.   ESOPHAGOGASTRODUODENOSCOPY (EGD) WITH ESOPHAGEAL DILATION N/A 04/15/2013   Procedure: ESOPHAGOGASTRODUODENOSCOPY (EGD) WITH ESOPHAGEAL DILATION;  Surgeon: Danie Binder, MD;  Location: [facility];  Service: Endoscopy;  Laterality: N/A;  11:45-moved to 12:30 Darius Bump to notify pt   EYE SURGERY  2010   EYE SURGERY     IR KYPHO LUMBAR INC FX REDUCE BONE BX UNI/BIL CANNULATION INC/IMAGING  08/23/2018   IR RADIOLOGIST EVAL & MGMT  12/23/2018   right thumb surgery      Prior to Admission medications   Medication Sig Start Date End Date Taking? Authorizing Provider  albuterol (PROVENTIL) (2.5 MG/3ML) 0.083% nebulizer solution Take 3 mLs (2.5 mg total) by nebulization every 6 (six) hours as needed for wheezing or shortness of breath. 09/08/18  Yes Gerlene Fee, NP  albuterol (VENTOLIN HFA) 108 (90 Base) MCG/ACT inhaler Inhale 1-2 puffs into the lungs every 6 (six) hours as needed for wheezing or shortness of breath.   Yes [provider]  Ascorbic Acid (VITAMIN C) 1000 MG tablet Take 1,000 mg by mouth daily.   Yes [provider]  B Complex Vitamins (VITAMIN B COMPLEX) TABS Take 1 tablet by mouth daily.   Yes  [provider]  Biotin 300 MCG TABS Take 300 mcg by mouth daily after supper.    Yes [provider]  Calcium Carb-Cholecalciferol (CALCIUM 600 + D PO) Take 1 tablet by mouth in the morning and at bedtime.   Yes [provider]  COLLAGEN PO Take 1 capsule by mouth daily.   Yes [provider]  ELIQUIS 5 MG TABS tablet Take 5 mg by mouth 2 (two) times daily. 08/19/21  Yes [provider]  ezetimibe (ZETIA) 10 MG tablet Take 1 tablet (10 mg total) by mouth daily. 09/08/18  Yes Gerlene Fee, NP  famotidine (PEPCID) 20 MG tablet Take 20 mg by mouth daily. 08/19/21  Yes  [provider]  Ferrous Gluconate (IRON) 240 (27 Fe) MG TABS Take 1 tablet by mouth daily. 09/08/18  Yes Gerlene Fee, NP  Flaxseed, Linseed, (FLAXSEED OIL) 1000 MG CAPS Take 1,000 mg by mouth daily.    Yes [provider]  flecainide (TAMBOCOR) 50 MG tablet TAKE 1 TABLET(50 MG) BY MOUTH TWICE DAILY 02/11/22  Yes Evans Lance, MD  furosemide (LASIX) 40 MG tablet Take 1.5 tablets (60 mg total) by mouth daily. (May take an additional '20mg'$  as needed for swelling.) 02/28/22  Yes Branch, Alphonse Guild, MD  levothyroxine (SYNTHROID) 75 MCG tablet Take 75 mcg by mouth daily before breakfast.   Yes [provider]  losartan (COZAAR) 25 MG tablet Take 25 mg by mouth daily.   Yes [provider]  Lutein 40 MG CAPS Take 40 mg by mouth daily.  08/24/18  Yes [provider]  Magnesium 250 MG TABS Take 250 mg by mouth daily.    Yes [provider]  memantine (NAMENDA) 10 MG tablet Take 1 tablet (10 mg total) by mouth 2 (two) times daily. 05/15/22  Yes Suzzanne Cloud, NP  metoprolol tartrate (LOPRESSOR) 25 MG tablet Take 1 tablet (25 mg total) by mouth 2 (two) times daily. (MAY TAKE AN ADDITIONAL '25MG'$  AS NEEDED FOR PALPITATIONS) 02/21/21  Yes Branch, Alphonse Guild, MD  Omega-3 Fatty Acids (FISH OIL) 1200 MG CAPS Take 1,200 mg by mouth daily.   Yes [provider]  pantoprazole (PROTONIX) 40 MG tablet Take 1 tablet (40 mg total) by mouth daily. 12/18/18  Yes Johnson, Clanford L, MD  potassium chloride (KLOR-CON M) 10 MEQ tablet Take 10 mEq by mouth 2 (two) times daily. 06/10/21  Yes [provider]  Turmeric 500 MG TABS Take 500 mg by mouth daily.    Yes [provider]  vitamin E 180 MG (400 UNITS) capsule Take 400 Units by mouth daily.   Yes [provider]  Zinc 50 MG TABS Take 50 mg by mouth daily.    Yes [provider]    Allergies as of 04/21/2022 - Review Complete 04/20/2022  Allergen Reaction Noted    Epinephrine Anaphylaxis 11/18/2011   Hydrocodone-acetaminophen  01/31/2021   Demerol [meperidine] Other (See Comments) 11/18/2011   Vicodin [hydrocodone-acetaminophen] Other (See Comments) 11/18/2011    Family History  Problem Relation Age of Onset   Colon cancer Father        age 70   Prostate cancer Father    Pancreatic cancer Mother        age 28    Social History   Socioeconomic History   Marital status: Widowed    Spouse name: Not on file   Number of children: 1   Years of education: 12   Highest  education level: 12th grade  Occupational History   Not on file  Tobacco Use   Smoking status: Never    Passive exposure: Never   Smokeless tobacco: Never  Vaping Use   Vaping Use: Never used  Substance and Sexual Activity   Alcohol use: No   Drug use: No   Sexual activity: Not Currently    Partners: Male  Other Topics Concern   Not on file  Social History Narrative   Lives alone   Right Handed    Drinks no caffeine daily   Social Determinants of Health   Financial Resource Strain: Low Risk  (02/06/2022)   Overall Financial Resource Strain (CARDIA)    Difficulty of Paying Living Expenses: Not hard at all  Food Insecurity: No Food Insecurity (02/06/2022)   Hunger Vital Sign    Worried About Running Out of Food in the Last Year: Never true    Ran Out of Food in the Last Year: Never true  Transportation Needs: No Transportation Needs (02/06/2022)   PRAPARE - Hydrologist (Medical): No    Lack of Transportation (Non-Medical): No  Physical Activity: Inactive (02/06/2022)   Exercise Vital Sign    Days of Exercise per Week: 0 days    Minutes of Exercise per Session: 0 min  Stress: No Stress Concern Present (02/06/2022)   [place]    Feeling of Stress : Only a little  Social Connections: Moderately Integrated (02/06/2022)   Social Connection and Isolation Panel [NHANES]     Frequency of Communication with Friends and Family: More than three times a week    Frequency of Social Gatherings with Friends and Family: More than three times a week    Attends Religious Services: More than 4 times per year    Active Member of [employer] or Organizations: Yes    Attends Archivist Meetings: More than 4 times per year    Marital Status: Widowed  Intimate Partner Violence: Not At Risk (02/06/2022)   Humiliation, Afraid, Rape, and Kick questionnaire    Fear of Current or Ex-Partner: No    Emotionally Abused: No    Physically Abused: No    Sexually Abused: No    Review of Systems: See HPI, otherwise negative ROS  Physical Exam: Vital signs in last 24 hours: Temp:  [97.9 F (36.6 C)] 97.9 F (36.6 C) (12/28 0700) Pulse Rate:  [55] 55 (12/28 0700) Resp:  [22] 22 (12/28 0700) BP: (150)/(59) 150/59 (12/28 0700) SpO2:  [100 %] 100 % (12/28 0700) Weight:  [61.2 kg] 61.2 kg (12/28 0700)   General:   Alert,  Well-developed, well-nourished, pleasant and cooperative in NAD Head:  Normocephalic and atraumatic. Eyes:  Sclera clear, no icterus.   Conjunctiva pink. Ears:  Normal auditory acuity. Nose:  No deformity, discharge,  or lesions. Msk:  Symmetrical without gross deformities. Normal posture. Extremities:  Without clubbing or edema. Neurologic:  Alert and  oriented x4;  grossly normal neurologically. Skin:  Intact without significant lesions or rashes. Psych:  Alert and cooperative. Normal mood and affect.  Impression/Plan: [name] is here for a colonoscopy to be performed for LLQ abdominal pain and rectal bleeding.   The risks of the procedure including infection, bleed, or perforation as well as benefits, limitations, alternatives and imponderables have been reviewed with the patient. Questions have been answered. All parties agreeable.

## 2022-05-22 NOTE — Discharge Instructions (Addendum)
  Colonoscopy Discharge Instructions  Read the instructions outlined below and refer to this sheet in the next few weeks. These discharge instructions provide you with general information on caring for yourself after you leave the hospital. Your doctor may also give you specific instructions. While your treatment has been planned according to the most current medical practices available, unavoidable complications occasionally occur.   ACTIVITY You may resume your regular activity, but move at a slower pace for the next 24 hours.  Take frequent rest periods for the next 24 hours.  Walking will help get rid of the air and reduce the bloated feeling in your belly (abdomen).  No driving for 24 hours (because of the medicine (anesthesia) used during the test).   Do not sign any important legal documents or operate any machinery for 24 hours (because of the anesthesia used during the test).  NUTRITION Drink plenty of fluids.  You may resume your normal diet as instructed by your doctor.  Begin with a light meal and progress to your normal diet. Heavy or fried foods are harder to digest and may make you feel sick to your stomach (nauseated).  Avoid alcoholic beverages for 24 hours or as instructed.  MEDICATIONS You may resume your normal medications unless your doctor tells you otherwise.  WHAT YOU CAN EXPECT TODAY Some feelings of bloating in the abdomen.  Passage of more gas than usual.  Spotting of blood in your stool or on the toilet paper.  IF YOU HAD POLYPS REMOVED DURING THE COLONOSCOPY: No aspirin products for 7 days or as instructed.  No alcohol for 7 days or as instructed.  Eat a soft diet for the next 24 hours.  FINDING OUT THE RESULTS OF YOUR TEST Not all test results are available during your visit. If your test results are not back during the visit, make an appointment with your caregiver to find out the results. Do not assume everything is normal if you have not heard from your  caregiver or the medical facility. It is important for you to follow up on all of your test results.  SEEK IMMEDIATE MEDICAL ATTENTION IF: You have more than a spotting of blood in your stool.  Your belly is swollen (abdominal distention).  You are nauseated or vomiting.  You have a temperature over 101.  You have abdominal pain or discomfort that is severe or gets worse throughout the day.   Your colonoscopy revealed 2 polyp(s) which I removed successfully. Await pathology results, my office will contact you. Given your age, I do not think we need to perform further colonoscopies for polyp surveillance purposes.   You also have diverticulosis and internal hemorrhoids. I would recommend increasing fiber in your diet or adding OTC Benefiber/Metamucil. Be sure to drink at least 4 to 6 glasses of water daily. Follow-up with GI in 2-3 months  Resume Eliquis today.   I hope you have a great rest of your week!  Elon Alas. Abbey Chatters, D.O. Gastroenterology and Hepatology [facility]

## 2022-05-22 NOTE — Anesthesia Preprocedure Evaluation (Addendum)
Anesthesia Evaluation  Patient identified by MRN, date of birth, ID band Patient awake    Reviewed: Allergy & Precautions, NPO status , Patient's Chart, lab work & pertinent test results, reviewed documented beta blocker date and time   History of Anesthesia Complications (+) PONV and history of anesthetic complications (pt reports TIA and sezures after surgery)  Airway Mallampati: II  TM Distance: >3 FB Neck ROM: Full    Dental  (+) Dental Advisory Given, Caps   Pulmonary asthma    Pulmonary exam normal breath sounds clear to auscultation       Cardiovascular Exercise Tolerance: Good hypertension, Pt. on medications and Pt. on home beta blockers + CAD and +CHF  Normal cardiovascular exam+ dysrhythmias Atrial Fibrillation  Rhythm:Regular Rate:Normal  1. Left ventricular ejection fraction, by estimation, is 60 to 65%. The  left ventricle has normal function. The left ventricle has no regional  wall motion abnormalities. There is mild left ventricular hypertrophy.  Left ventricular diastolic parameters  are consistent with Grade III diastolic dysfunction (restrictive).   2. Right ventricular systolic function is normal. The right ventricular  size is normal.   3. Left atrial size was mildly dilated.   4. Mitral Valve Area (MVA) = 1.76 cm2. The mitral valve is degenerative.  Mild mitral valve regurgitation. Mild mitral stenosis. Moderate mitral  annular calcification.   5. The aortic valve was not assessed. Aortic valve regurgitation is not  visualized.     Neuro/Psych  PSYCHIATRIC DISORDERS Anxiety Depression    TIACVA (right sided weakness), Residual Symptoms    GI/Hepatic ,GERD  Medicated and Controlled,,  Endo/Other  Hypothyroidism    Renal/GU Renal InsufficiencyRenal disease     Musculoskeletal  (+) Arthritis , Osteoarthritis,    Abdominal   Peds  Hematology   Anesthesia Other Findings    Reproductive/Obstetrics                             Anesthesia Physical Anesthesia Plan  ASA: 3  Anesthesia Plan: General   Post-op Pain Management: Minimal or no pain anticipated   Induction: Intravenous  PONV Risk Score and Plan: Propofol infusion  Airway Management Planned: Nasal Cannula and Natural Airway  Additional Equipment:   Intra-op Plan:   Post-operative Plan:   Informed Consent: I have reviewed the patients History and Physical, chart, labs and discussed the procedure including the risks, benefits and alternatives for the proposed anesthesia with the patient or authorized representative who has indicated his/her understanding and acceptance.     Dental advisory given  Plan Discussed with: CRNA and Surgeon  Anesthesia Plan Comments:        Anesthesia Quick Evaluation

## 2022-05-22 NOTE — Transfer of Care (Signed)
Immediate Anesthesia Transfer of Care Note  Patient: [name]  Procedure(s) Performed: COLONOSCOPY WITH PROPOFOL POLYPECTOMY  Patient Location: Short Stay  Anesthesia Type:General  Level of Consciousness: drowsy  Airway & Oxygen Therapy: Patient Spontanous Breathing  Post-op Assessment: Report given to RN and Post -op Vital signs reviewed and stable  Post vital signs: Reviewed and stable  Last Vitals:  Vitals Value Taken Time  BP 90/39 05/22/22 0827  Temp 36.6 C 05/22/22 0827  Pulse 61 05/22/22 0827  Resp 20 05/22/22 0827  SpO2 97 % 05/22/22 0827    Last Pain:  Vitals:   05/22/22 0827  TempSrc: Oral  PainSc: 0-No pain      Patients Stated Pain Goal: 8 (79/48/01 6553)  Complications: No notable events documented.

## 2022-05-23 LAB — SURGICAL PATHOLOGY

## 2022-05-27 DIAGNOSIS — I5032 Chronic diastolic (congestive) heart failure: Secondary | ICD-10-CM | POA: Diagnosis not present

## 2022-05-27 DIAGNOSIS — I739 Peripheral vascular disease, unspecified: Secondary | ICD-10-CM | POA: Diagnosis not present

## 2022-05-27 DIAGNOSIS — Z6826 Body mass index (BMI) 26.0-26.9, adult: Secondary | ICD-10-CM | POA: Diagnosis not present

## 2022-05-27 DIAGNOSIS — R600 Localized edema: Secondary | ICD-10-CM | POA: Diagnosis not present

## 2022-05-27 DIAGNOSIS — F0390 Unspecified dementia without behavioral disturbance: Secondary | ICD-10-CM | POA: Diagnosis not present

## 2022-05-27 DIAGNOSIS — Z515 Encounter for palliative care: Secondary | ICD-10-CM | POA: Diagnosis not present

## 2022-05-27 DIAGNOSIS — D692 Other nonthrombocytopenic purpura: Secondary | ICD-10-CM | POA: Diagnosis not present

## 2022-05-27 DIAGNOSIS — M25551 Pain in right hip: Secondary | ICD-10-CM | POA: Diagnosis not present

## 2022-05-27 DIAGNOSIS — Z7901 Long term (current) use of anticoagulants: Secondary | ICD-10-CM | POA: Diagnosis not present

## 2022-05-27 DIAGNOSIS — J45909 Unspecified asthma, uncomplicated: Secondary | ICD-10-CM | POA: Diagnosis not present

## 2022-05-29 ENCOUNTER — Encounter (HOSPITAL_COMMUNITY): Payer: Self-pay | Admitting: Internal Medicine

## 2022-05-29 DIAGNOSIS — M62551 Muscle wasting and atrophy, not elsewhere classified, right thigh: Secondary | ICD-10-CM | POA: Diagnosis not present

## 2022-05-29 DIAGNOSIS — M2569 Stiffness of other specified joint, not elsewhere classified: Secondary | ICD-10-CM | POA: Diagnosis not present

## 2022-05-29 DIAGNOSIS — M62561 Muscle wasting and atrophy, not elsewhere classified, right lower leg: Secondary | ICD-10-CM | POA: Diagnosis not present

## 2022-05-29 DIAGNOSIS — M545 Low back pain, unspecified: Secondary | ICD-10-CM | POA: Diagnosis not present

## 2022-06-02 DIAGNOSIS — M2569 Stiffness of other specified joint, not elsewhere classified: Secondary | ICD-10-CM | POA: Diagnosis not present

## 2022-06-02 DIAGNOSIS — M62551 Muscle wasting and atrophy, not elsewhere classified, right thigh: Secondary | ICD-10-CM | POA: Diagnosis not present

## 2022-06-02 DIAGNOSIS — M62561 Muscle wasting and atrophy, not elsewhere classified, right lower leg: Secondary | ICD-10-CM | POA: Diagnosis not present

## 2022-06-02 DIAGNOSIS — M545 Low back pain, unspecified: Secondary | ICD-10-CM | POA: Diagnosis not present

## 2022-06-05 DIAGNOSIS — M2569 Stiffness of other specified joint, not elsewhere classified: Secondary | ICD-10-CM | POA: Diagnosis not present

## 2022-06-05 DIAGNOSIS — M62551 Muscle wasting and atrophy, not elsewhere classified, right thigh: Secondary | ICD-10-CM | POA: Diagnosis not present

## 2022-06-05 DIAGNOSIS — M62561 Muscle wasting and atrophy, not elsewhere classified, right lower leg: Secondary | ICD-10-CM | POA: Diagnosis not present

## 2022-06-05 DIAGNOSIS — M545 Low back pain, unspecified: Secondary | ICD-10-CM | POA: Diagnosis not present

## 2022-06-09 DIAGNOSIS — M545 Low back pain, unspecified: Secondary | ICD-10-CM | POA: Diagnosis not present

## 2022-06-09 DIAGNOSIS — M62561 Muscle wasting and atrophy, not elsewhere classified, right lower leg: Secondary | ICD-10-CM | POA: Diagnosis not present

## 2022-06-09 DIAGNOSIS — M2569 Stiffness of other specified joint, not elsewhere classified: Secondary | ICD-10-CM | POA: Diagnosis not present

## 2022-06-09 DIAGNOSIS — M62551 Muscle wasting and atrophy, not elsewhere classified, right thigh: Secondary | ICD-10-CM | POA: Diagnosis not present

## 2022-06-11 DIAGNOSIS — E039 Hypothyroidism, unspecified: Secondary | ICD-10-CM | POA: Diagnosis not present

## 2022-06-11 DIAGNOSIS — I10 Essential (primary) hypertension: Secondary | ICD-10-CM | POA: Diagnosis not present

## 2022-06-12 DIAGNOSIS — M62561 Muscle wasting and atrophy, not elsewhere classified, right lower leg: Secondary | ICD-10-CM | POA: Diagnosis not present

## 2022-06-12 DIAGNOSIS — M62551 Muscle wasting and atrophy, not elsewhere classified, right thigh: Secondary | ICD-10-CM | POA: Diagnosis not present

## 2022-06-12 DIAGNOSIS — M545 Low back pain, unspecified: Secondary | ICD-10-CM | POA: Diagnosis not present

## 2022-06-12 DIAGNOSIS — M2569 Stiffness of other specified joint, not elsewhere classified: Secondary | ICD-10-CM | POA: Diagnosis not present

## 2022-06-16 DIAGNOSIS — M62551 Muscle wasting and atrophy, not elsewhere classified, right thigh: Secondary | ICD-10-CM | POA: Diagnosis not present

## 2022-06-16 DIAGNOSIS — M545 Low back pain, unspecified: Secondary | ICD-10-CM | POA: Diagnosis not present

## 2022-06-16 DIAGNOSIS — M62561 Muscle wasting and atrophy, not elsewhere classified, right lower leg: Secondary | ICD-10-CM | POA: Diagnosis not present

## 2022-06-16 DIAGNOSIS — M2569 Stiffness of other specified joint, not elsewhere classified: Secondary | ICD-10-CM | POA: Diagnosis not present

## 2022-06-18 ENCOUNTER — Encounter: Payer: Self-pay | Admitting: Internal Medicine

## 2022-06-18 DIAGNOSIS — I130 Hypertensive heart and chronic kidney disease with heart failure and stage 1 through stage 4 chronic kidney disease, or unspecified chronic kidney disease: Secondary | ICD-10-CM | POA: Diagnosis not present

## 2022-06-18 DIAGNOSIS — E039 Hypothyroidism, unspecified: Secondary | ICD-10-CM | POA: Diagnosis not present

## 2022-06-18 DIAGNOSIS — E782 Mixed hyperlipidemia: Secondary | ICD-10-CM | POA: Diagnosis not present

## 2022-06-18 DIAGNOSIS — R600 Localized edema: Secondary | ICD-10-CM | POA: Diagnosis not present

## 2022-06-18 DIAGNOSIS — E870 Hyperosmolality and hypernatremia: Secondary | ICD-10-CM | POA: Insufficient documentation

## 2022-06-18 DIAGNOSIS — M25551 Pain in right hip: Secondary | ICD-10-CM | POA: Diagnosis not present

## 2022-06-18 DIAGNOSIS — N1831 Chronic kidney disease, stage 3a: Secondary | ICD-10-CM | POA: Diagnosis not present

## 2022-06-18 DIAGNOSIS — I480 Paroxysmal atrial fibrillation: Secondary | ICD-10-CM | POA: Diagnosis not present

## 2022-06-18 DIAGNOSIS — M545 Low back pain, unspecified: Secondary | ICD-10-CM | POA: Diagnosis not present

## 2022-06-18 DIAGNOSIS — R109 Unspecified abdominal pain: Secondary | ICD-10-CM | POA: Diagnosis not present

## 2022-06-18 DIAGNOSIS — I5032 Chronic diastolic (congestive) heart failure: Secondary | ICD-10-CM | POA: Diagnosis not present

## 2022-06-18 DIAGNOSIS — N130 Hydronephrosis with ureteropelvic junction obstruction: Secondary | ICD-10-CM | POA: Diagnosis not present

## 2022-06-19 DIAGNOSIS — M2569 Stiffness of other specified joint, not elsewhere classified: Secondary | ICD-10-CM | POA: Diagnosis not present

## 2022-06-19 DIAGNOSIS — M62551 Muscle wasting and atrophy, not elsewhere classified, right thigh: Secondary | ICD-10-CM | POA: Diagnosis not present

## 2022-06-19 DIAGNOSIS — M545 Low back pain, unspecified: Secondary | ICD-10-CM | POA: Diagnosis not present

## 2022-06-19 DIAGNOSIS — M62561 Muscle wasting and atrophy, not elsewhere classified, right lower leg: Secondary | ICD-10-CM | POA: Diagnosis not present

## 2022-06-23 DIAGNOSIS — M2569 Stiffness of other specified joint, not elsewhere classified: Secondary | ICD-10-CM | POA: Diagnosis not present

## 2022-06-23 DIAGNOSIS — M62561 Muscle wasting and atrophy, not elsewhere classified, right lower leg: Secondary | ICD-10-CM | POA: Diagnosis not present

## 2022-06-23 DIAGNOSIS — M62551 Muscle wasting and atrophy, not elsewhere classified, right thigh: Secondary | ICD-10-CM | POA: Diagnosis not present

## 2022-06-23 DIAGNOSIS — M545 Low back pain, unspecified: Secondary | ICD-10-CM | POA: Diagnosis not present

## 2022-06-26 ENCOUNTER — Encounter: Payer: PPO | Admitting: Cardiology

## 2022-06-26 ENCOUNTER — Encounter: Payer: Self-pay | Admitting: Cardiology

## 2022-06-26 ENCOUNTER — Other Ambulatory Visit: Payer: Self-pay

## 2022-06-26 VITALS — BP 120/80 | HR 52 | Ht 60.0 in | Wt 136.0 lb

## 2022-06-26 DIAGNOSIS — M62551 Muscle wasting and atrophy, not elsewhere classified, right thigh: Secondary | ICD-10-CM | POA: Diagnosis not present

## 2022-06-26 DIAGNOSIS — I5032 Chronic diastolic (congestive) heart failure: Secondary | ICD-10-CM

## 2022-06-26 DIAGNOSIS — M2569 Stiffness of other specified joint, not elsewhere classified: Secondary | ICD-10-CM | POA: Diagnosis not present

## 2022-06-26 DIAGNOSIS — M810 Age-related osteoporosis without current pathological fracture: Secondary | ICD-10-CM | POA: Insufficient documentation

## 2022-06-26 DIAGNOSIS — Z7901 Long term (current) use of anticoagulants: Secondary | ICD-10-CM | POA: Insufficient documentation

## 2022-06-26 DIAGNOSIS — I480 Paroxysmal atrial fibrillation: Secondary | ICD-10-CM

## 2022-06-26 DIAGNOSIS — R0789 Other chest pain: Secondary | ICD-10-CM | POA: Insufficient documentation

## 2022-06-26 DIAGNOSIS — R600 Localized edema: Secondary | ICD-10-CM | POA: Insufficient documentation

## 2022-06-26 DIAGNOSIS — I3481 Nonrheumatic mitral (valve) annulus calcification: Secondary | ICD-10-CM | POA: Insufficient documentation

## 2022-06-26 DIAGNOSIS — J45909 Unspecified asthma, uncomplicated: Secondary | ICD-10-CM | POA: Insufficient documentation

## 2022-06-26 DIAGNOSIS — M62561 Muscle wasting and atrophy, not elsewhere classified, right lower leg: Secondary | ICD-10-CM | POA: Diagnosis not present

## 2022-06-26 DIAGNOSIS — I10 Essential (primary) hypertension: Secondary | ICD-10-CM | POA: Diagnosis not present

## 2022-06-26 DIAGNOSIS — Z8673 Personal history of transient ischemic attack (TIA), and cerebral infarction without residual deficits: Secondary | ICD-10-CM | POA: Insufficient documentation

## 2022-06-26 DIAGNOSIS — R002 Palpitations: Secondary | ICD-10-CM | POA: Insufficient documentation

## 2022-06-26 DIAGNOSIS — M545 Low back pain, unspecified: Secondary | ICD-10-CM | POA: Diagnosis not present

## 2022-06-26 DIAGNOSIS — I110 Hypertensive heart disease with heart failure: Secondary | ICD-10-CM | POA: Insufficient documentation

## 2022-06-26 DIAGNOSIS — I340 Nonrheumatic mitral (valve) insufficiency: Secondary | ICD-10-CM | POA: Insufficient documentation

## 2022-06-26 DIAGNOSIS — Z79899 Other long term (current) drug therapy: Secondary | ICD-10-CM | POA: Insufficient documentation

## 2022-06-26 MED ORDER — FUROSEMIDE 40 MG PO TABS: ORAL_TABLET | ORAL | 3 refills | Status: DC

## 2022-06-26 NOTE — Progress Notes (Signed)
Clinical Summary [name] is a 87 y.o.female seen today for follow up of the following medical problems.      1. Palpitations/ paroxysmal afib - seen in ER 01/29/21 with abdominal pain, palpitations. Awoke her from sleep.  - found to be in afib, new diagnosis for her. Self converted in ER - already on coumadin - 11/2018 echo LVEF 60-65%, mild LAE, mild MR -has been on lopressor '25mg'$  bid for a number of years   07/2021 ER visit with chest pain, palpitatins - EMS eval HRs 180s, found to be in afib with RVR - given IV diltiazem '15mg'$  and then drip, converted to SR - trop up to 160 thought to be rate related. 10/2020 nuclear stress no ischemia   - in general 2-3 times per week, usually 5 minutes - episode in March was more severe than normal episodes. +SOB   - seen by EP, started on flecandie 08/2021  - occasional palpitations, lasts just a few minutes - symptosm better with flecanide.   03/2022 monitor: no significant arrhythmias - rare symptoms. No bleeding on eliquis     2. CVA/TIAs      3. Leg edema - recent leg swelling over summertime.  - some recent SOB though difficult for her to assess given her asthma - chronically sleeps at angle due to prior back injury - compliant with lasix '40mg'$  daily, will take additioanl '20mg'$  as needed    02/2022 echo: LVEF 60-65%, no WMAs, grade III dd, normal RV function, mild MR/MS  - Jan 2024 Cr 1.5. Was 1.7 in December. Last year 07/2021 Cr 1.1   4. Chest tightness  10/2020 nuclear stress no ischemia   - midchest discomfort, pressure 5/10 in severity - no recent episodes, random pattern.  Past Medical History:  Diagnosis Date   Adenomatous colon polyp 2008   Anxiety    Asthma    has not needed in over a year   Complication of anesthesia    pt reports TIA and sezures after surgery   Coronary artery disease    CVA (cerebral vascular accident) ([place])    x 3   Degenerative disc disease, lumbar    Depression     Diverticulosis    Dysrhythmia    GERD (gastroesophageal reflux disease)    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50   age 37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 2 weeks   Hemorrhoids 2007   History of kidney stones    HTN (hypertension)    Hyperplastic colon polyp 2005   Hypothyroidism    Narcolepsy    PONV (postoperative nausea and vomiting)    TIA (transient ischemic attack)      Allergies  Allergen Reactions   Epinephrine Anaphylaxis   Demerol [Meperidine] Other (See Comments)    headaches   Vicodin [Hydrocodone-Acetaminophen] Other (See Comments)    Said it makes her crazy. Pt tolerates acetaminophen     Current Outpatient Medications  Medication Sig Dispense Refill   albuterol (PROVENTIL) (2.5 MG/3ML) 0.083% nebulizer solution Take 3 mLs (2.5 mg total) by nebulization every 6 (six) hours as needed for wheezing or shortness of breath. 75 mL 0   albuterol (VENTOLIN HFA) 108 (90 Base) MCG/ACT inhaler Inhale 1-2 puffs into the lungs every 6 (six) hours as needed for wheezing or shortness of breath.     Ascorbic Acid (VITAMIN C) 1000 MG tablet Take 1,000 mg by mouth daily.     B Complex Vitamins (VITAMIN B COMPLEX) TABS Take 1  tablet by mouth daily.     Biotin 300 MCG TABS Take 300 mcg by mouth daily after supper.      Calcium Carb-Cholecalciferol (CALCIUM 600 + D PO) Take 1 tablet by mouth in the morning and at bedtime.     COLLAGEN PO Take 1 capsule by mouth daily.     ELIQUIS 5 MG TABS tablet Take 5 mg by mouth 2 (two) times daily.     ezetimibe (ZETIA) 10 MG tablet Take 1 tablet (10 mg total) by mouth daily. 30 tablet 0   famotidine (PEPCID) 20 MG tablet Take 20 mg by mouth daily.     Ferrous Gluconate (IRON) 240 (27 Fe) MG TABS Take 1 tablet by mouth daily. 30 tablet 0   Flaxseed, Linseed, (FLAXSEED OIL) 1000 MG CAPS Take 1,000 mg by mouth daily.      flecainide (TAMBOCOR) 50 MG tablet TAKE 1 TABLET(50 MG) BY MOUTH TWICE DAILY 180 tablet 3   furosemide (LASIX) 40 MG tablet Take 1.5 tablets (60 mg total) by  mouth daily. (May take an additional '20mg'$  as needed for swelling.) 60 tablet 6   levothyroxine (SYNTHROID) 75 MCG tablet Take 75 mcg by mouth daily before breakfast.     losartan (COZAAR) 25 MG tablet Take 25 mg by mouth daily.     Lutein 40 MG CAPS Take 40 mg by mouth daily.      Magnesium 250 MG TABS Take 250 mg by mouth daily.      memantine (NAMENDA) 10 MG tablet Take 1 tablet (10 mg total) by mouth 2 (two) times daily. 180 tablet 3   metoprolol tartrate (LOPRESSOR) 25 MG tablet Take 1 tablet (25 mg total) by mouth 2 (two) times daily. (MAY TAKE AN ADDITIONAL '25MG'$  AS NEEDED FOR PALPITATIONS) 90 tablet 6   Omega-3 Fatty Acids (FISH OIL) 1200 MG CAPS Take 1,200 mg by mouth daily.     pantoprazole (PROTONIX) 40 MG tablet Take 1 tablet (40 mg total) by mouth daily.     potassium chloride (KLOR-CON M) 10 MEQ tablet Take 10 mEq by mouth 2 (two) times daily.     Turmeric 500 MG TABS Take 500 mg by mouth daily.      vitamin E 180 MG (400 UNITS) capsule Take 400 Units by mouth daily.     Zinc 50 MG TABS Take 50 mg by mouth daily.      No current facility-administered medications for this visit.     Past Surgical History:  Procedure Laterality Date   ABDOMINAL HYSTERECTOMY     age 49, complicated by poor wound healing, followed by revision of scar and radiation for ?malignancy   BACK SURGERY  07/2018   lost 2 inches   BREAST ENHANCEMENT SURGERY     BREAST IMPLANT EXCHANGE Right 06/23/2016   Procedure: RIGHT BREAST IMPLANT REMOVAL AND REPLACEMENT;  Surgeon: Cristine Polio, MD;  Location: [facility];  Service: Plastics;  Laterality: Right;   COLONOSCOPY  2005   2 hyperplastic polyps   COLONOSCOPY  2007   Dr. Oneida Alar- hemorrhoids   COLONOSCOPY  2008   Dr. Reed Pandy polyp- rare sigmoid diverticulosis, internal hemorrhoids   COLONOSCOPY  12/01/2011   Procedure: COLONOSCOPY;  Surgeon: Danie Binder, MD;  Location: [facility];  Service: Endoscopy;  Laterality: N/A;   12:30 PM   COLONOSCOPY WITH PROPOFOL N/A 05/22/2022   Procedure: COLONOSCOPY WITH PROPOFOL;  Surgeon: Eloise Harman, DO;  Location: [facility];  Service: Endoscopy;  Laterality: N/A;  1:00 PM   CYSTOSCOPY/URETEROSCOPY/HOLMIUM LASER/STENT PLACEMENT Left 02/29/2020   Procedure: CYSTOSCOPY/URETEROSCOPY/HOLMIUM LASER/STENT PLACEMENT;  Surgeon: Ceasar Mons, MD;  Location: [facility];  Service: Urology;  Laterality: Left;   epidural steroid injection     She is getting injections with Dr. Nelva Bush q3wks.   ESOPHAGOGASTRODUODENOSCOPY (EGD) WITH ESOPHAGEAL DILATION N/A 04/15/2013   Procedure: ESOPHAGOGASTRODUODENOSCOPY (EGD) WITH ESOPHAGEAL DILATION;  Surgeon: Danie Binder, MD;  Location: [facility];  Service: Endoscopy;  Laterality: N/A;  11:45-moved to 12:30 Darius Bump to notify pt   EYE SURGERY  2010   EYE SURGERY     IR KYPHO LUMBAR INC FX REDUCE BONE BX UNI/BIL CANNULATION INC/IMAGING  08/23/2018   IR RADIOLOGIST EVAL & MGMT  12/23/2018   POLYPECTOMY  05/22/2022   Procedure: POLYPECTOMY;  Surgeon: Eloise Harman, DO;  Location: [facility];  Service: Endoscopy;;   right thumb surgery       Allergies  Allergen Reactions   Epinephrine Anaphylaxis   Demerol [Meperidine] Other (See Comments)    headaches   Vicodin [Hydrocodone-Acetaminophen] Other (See Comments)    [name] it makes her crazy. Pt tolerates acetaminophen      Family History  Problem Relation Age of Onset   Colon cancer Father        age 68   Prostate cancer Father    Pancreatic cancer Mother        age 30     Social History Ms. Valko reports that she has never smoked. She has never been exposed to tobacco smoke. She has never used smokeless tobacco. Ms. Coddington reports no history of alcohol use.   Review of Systems CONSTITUTIONAL: No weight loss, fever, chills, weakness or fatigue.  HEENT: Eyes: No visual loss, blurred vision, double vision or yellow sclerae.No hearing loss, sneezing,  congestion, runny nose or sore throat.  SKIN: No rash or itching.  CARDIOVASCULAR: per hpi RESPIRATORY: No shortness of breath, cough or sputum.  GASTROINTESTINAL: No anorexia, nausea, vomiting or diarrhea. No abdominal pain or blood.  GENITOURINARY: No burning on urination, no polyuria NEUROLOGICAL: No headache, dizziness, syncope, paralysis, ataxia, numbness or tingling in the extremities. No change in bowel or bladder control.  MUSCULOSKELETAL: No muscle, back pain, joint pain or stiffness.  LYMPHATICS: No enlarged nodes. No history of splenectomy.  PSYCHIATRIC: No history of depression or anxiety.  ENDOCRINOLOGIC: No reports of sweating, cold or heat intolerance. No polyuria or polydipsia.  Marland Kitchen   Physical Examination Today's Vitals   06/26/22 1022 06/26/22 1053  BP: (!) 166/80 120/80  Pulse: (!) 52   SpO2: 90%   Weight: 136 lb (61.7 kg)   Height: 5' (1.524 m)    Body mass index is 26.56 kg/m.  Gen: resting comfortably, no acute distress HEENT: no scleral icterus, pupils equal round and reactive, no palptable cervical adenopathy,  CV: RRR, no m/r/g no jvd Resp: Clear to auscultation bilaterally GI: abdomen is soft, non-tender, non-distended, normal bowel sounds, no hepatosplenomegaly MSK: extremities are warm, no edema.  Skin: warm, no rash Neuro:  no focal deficits Psych: appropriate affect   Diagnostic Studies  2008 CORONARY ANGIOGRAPHY:  Left mainstem is angiographically normal.  It  bifurcates into the LAD and left circumflex.  The left main stem is  short.    The LAD is a medium caliber vessel in its proximal portion.  It gives  off a large first diagonal Dontarius Sheley that is actually bigger than the mid  and distal true LAD.  The LAD in its midportion has a long tubular 40%  stenosis.  The vessel beyond this is small diameter.  The first diagonal  Alyannah Sanks, which again is a large vessel and almost serves as a twin LAD  system, is angiographically without significant  disease.    The left circumflex has nonobstructive plaque in its ostium that appears  no worse than 20-25%.  The circumflex gives off a first obtuse marginal  Shaday Rayborn which is tortuous but has no significant angiographic disease.  The remainder of the circumflex is small.    The right coronary artery is dominant.  It is angiographically normal  throughout its proximal and midportions.  The distal vessel has  nonobstructive plaque.  It gives off a large posterior descending Mykell Rawl  which is angiographically normal.  It gives off a medium size  posterolateral Levaeh Vice.    Left ventricular function assessed in the 30 degrees right anterior  oblique projection demonstrates normal left ventricular function with a  left ventricular ejection fraction of 65%.  There is no mitral  regurgitation.    ASSESSMENT:  1. Nonobstructive coronary artery disease.  2. Normal left ventricular function.    RECOMMENDATIONS:  The patient should continue with medical therapy for  coronary artery disease.  Will plan on discharge later today if there  are no other significant issues that come up.     Jan 2019 nuclear stress   There was no ST segment deviation noted during stress. The study is normal. No ischemia or scar. This is a low risk study. Nuclear stress EF: 75%.       Jan 2019 echo Study Conclusions   - Left ventricle: The cavity size was normal. Wall thickness was    increased in a pattern of mild LVH. Systolic function was normal.    The estimated ejection fraction was in the range of 60% to 65%.    Wall motion was normal; there were no regional wall motion    abnormalities. The study is not technically sufficient to allow    evaluation of LV diastolic function.  - Mitral valve: Mildly calcified annulus. There was trivial    regurgitation.  - Right atrium: Central venous pressure (est): 3 mm Hg.  - Atrial septum: No defect or patent foramen ovale was identified.  - Tricuspid valve: There  was mild regurgitation.  - Pulmonary arteries: PA peak pressure: 36 mm Hg (S).  - Pericardium, extracardiac: There was no pericardial effusion.    11/2018 carotid US IMPRESSION: 1. Bilateral carotid bifurcation plaque resulting in less than 50% diameter ICA stenosis. 2. Antegrade bilateral vertebral arterial flow.   11/2018 echo IMPRESSIONS     1. The left ventricle has normal systolic function with an ejection  fraction of 60-65%. The cavity size was normal. There is mildly increased  left ventricular wall thickness. Left ventricular diastolic Doppler  parameters are consistent with  pseudonormalization.   2. The right ventricle has normal systolic function. The cavity was  normal. There is no increase in right ventricular wall thickness. Right  ventricular systolic pressure is normal with an estimated pressure of 28.4  mmHg.   3. Left atrial size was mildly dilated.   4. The mitral valve is grossly normal. Mild calcification of the mitral  valve leaflet. There is moderate mitral annular calcification present.  There is mild mitral regurgitation.   5. The tricuspid valve is grossly normal.   6. The aortic valve has an indeterminate number of cusps. Moderate  calcification  of the aortic valve.   7. The aorta is normal in size and structure.    10/2019 nuclear stress Lexiscan stress is electrically negative for ischemia Myoview scan shows normal perfusion No ischemia or scar LVEF calculated at 88% Low risk study  02/2022 echo 1. Left ventricular ejection fraction, by estimation, is 60 to 65%. The  left ventricle has normal function. The left ventricle has no regional  wall motion abnormalities. There is mild left ventricular hypertrophy.  Left ventricular diastolic parameters  are consistent with Grade III diastolic dysfunction (restrictive).   2. Right ventricular systolic function is normal. The right ventricular  size is normal.   3. Left atrial size was mildly dilated.    4. Mitral Valve Area (MVA) = 1.76 cm2. The mitral valve is degenerative.  Mild mitral valve regurgitation. Mild mitral stenosis. Moderate mitral  annular calcification.   5. The aortic valve was not assessed. Aortic valve regurgitation is not  visualized.   Assessment and Plan  1.PAF - symptoms improved on flecanide though not completely resolved - doing well, continue current meds   2. HFpEF - appears euvolemic today. Uptrend in Cr since we changed lasix to '60mg'$  daily, will lower to '60mg'$  alternating days with '40mg'$   3. HTN -at goal on manual recheck, conitnue current meds  F/u 6 months        Arnoldo Lenis, M.D.

## 2022-06-26 NOTE — Patient Instructions (Addendum)
Medication Instructions:  Change your Lasix to '60mg'$  alternating every other day with '40mg'$   Continue all other medications.     Labwork: none  Testing/Procedures: none  Follow-Up: 6 months   Any Other Special Instructions Will Be Listed Below (If Applicable).   If you need a refill on your cardiac medications before your next appointment, please call your pharmacy.

## 2022-06-27 DIAGNOSIS — M47896 Other spondylosis, lumbar region: Secondary | ICD-10-CM | POA: Diagnosis not present

## 2022-06-27 DIAGNOSIS — M5416 Radiculopathy, lumbar region: Secondary | ICD-10-CM | POA: Diagnosis not present

## 2022-06-30 DIAGNOSIS — M62561 Muscle wasting and atrophy, not elsewhere classified, right lower leg: Secondary | ICD-10-CM | POA: Diagnosis not present

## 2022-06-30 DIAGNOSIS — M2569 Stiffness of other specified joint, not elsewhere classified: Secondary | ICD-10-CM | POA: Diagnosis not present

## 2022-06-30 DIAGNOSIS — M62551 Muscle wasting and atrophy, not elsewhere classified, right thigh: Secondary | ICD-10-CM | POA: Diagnosis not present

## 2022-06-30 DIAGNOSIS — M545 Low back pain, unspecified: Secondary | ICD-10-CM | POA: Diagnosis not present

## 2022-07-01 ENCOUNTER — Other Ambulatory Visit (HOSPITAL_COMMUNITY): Payer: Self-pay | Admitting: Internal Medicine

## 2022-07-01 DIAGNOSIS — R109 Unspecified abdominal pain: Secondary | ICD-10-CM

## 2022-07-03 DIAGNOSIS — M62551 Muscle wasting and atrophy, not elsewhere classified, right thigh: Secondary | ICD-10-CM | POA: Diagnosis not present

## 2022-07-03 DIAGNOSIS — M2569 Stiffness of other specified joint, not elsewhere classified: Secondary | ICD-10-CM | POA: Diagnosis not present

## 2022-07-03 DIAGNOSIS — M62561 Muscle wasting and atrophy, not elsewhere classified, right lower leg: Secondary | ICD-10-CM | POA: Diagnosis not present

## 2022-07-03 DIAGNOSIS — M545 Low back pain, unspecified: Secondary | ICD-10-CM | POA: Diagnosis not present

## 2022-07-07 DIAGNOSIS — M2569 Stiffness of other specified joint, not elsewhere classified: Secondary | ICD-10-CM | POA: Diagnosis not present

## 2022-07-07 DIAGNOSIS — M62561 Muscle wasting and atrophy, not elsewhere classified, right lower leg: Secondary | ICD-10-CM | POA: Diagnosis not present

## 2022-07-07 DIAGNOSIS — M545 Low back pain, unspecified: Secondary | ICD-10-CM | POA: Diagnosis not present

## 2022-07-07 DIAGNOSIS — M62551 Muscle wasting and atrophy, not elsewhere classified, right thigh: Secondary | ICD-10-CM | POA: Diagnosis not present

## 2022-07-10 ENCOUNTER — Encounter (HOSPITAL_COMMUNITY)
Admission: RE | Admit: 2022-07-10 | Discharge: 2022-07-10 | Disposition: A | Discharge status: Home / Self Care | Payer: PPO | Source: Ambulatory Visit | Attending: Internal Medicine | Admitting: Internal Medicine

## 2022-07-10 VITALS — BP 170/64 | HR 58 | Resp 18

## 2022-07-10 DIAGNOSIS — Z79899 Other long term (current) drug therapy: Secondary | ICD-10-CM | POA: Diagnosis not present

## 2022-07-10 DIAGNOSIS — Z8673 Personal history of transient ischemic attack (TIA), and cerebral infarction without residual deficits: Secondary | ICD-10-CM | POA: Diagnosis not present

## 2022-07-10 DIAGNOSIS — M545 Low back pain, unspecified: Secondary | ICD-10-CM | POA: Diagnosis not present

## 2022-07-10 DIAGNOSIS — J45909 Unspecified asthma, uncomplicated: Secondary | ICD-10-CM | POA: Diagnosis not present

## 2022-07-10 DIAGNOSIS — I3481 Nonrheumatic mitral (valve) annulus calcification: Secondary | ICD-10-CM | POA: Diagnosis not present

## 2022-07-10 DIAGNOSIS — M2569 Stiffness of other specified joint, not elsewhere classified: Secondary | ICD-10-CM | POA: Diagnosis not present

## 2022-07-10 DIAGNOSIS — M62551 Muscle wasting and atrophy, not elsewhere classified, right thigh: Secondary | ICD-10-CM | POA: Diagnosis not present

## 2022-07-10 DIAGNOSIS — R0789 Other chest pain: Secondary | ICD-10-CM | POA: Diagnosis not present

## 2022-07-10 DIAGNOSIS — M810 Age-related osteoporosis without current pathological fracture: Secondary | ICD-10-CM

## 2022-07-10 DIAGNOSIS — Z7901 Long term (current) use of anticoagulants: Secondary | ICD-10-CM | POA: Diagnosis not present

## 2022-07-10 DIAGNOSIS — I5032 Chronic diastolic (congestive) heart failure: Secondary | ICD-10-CM | POA: Diagnosis not present

## 2022-07-10 DIAGNOSIS — M62561 Muscle wasting and atrophy, not elsewhere classified, right lower leg: Secondary | ICD-10-CM | POA: Diagnosis not present

## 2022-07-10 DIAGNOSIS — R002 Palpitations: Secondary | ICD-10-CM | POA: Diagnosis not present

## 2022-07-10 DIAGNOSIS — I110 Hypertensive heart disease with heart failure: Secondary | ICD-10-CM | POA: Diagnosis not present

## 2022-07-10 DIAGNOSIS — I480 Paroxysmal atrial fibrillation: Secondary | ICD-10-CM | POA: Diagnosis not present

## 2022-07-10 DIAGNOSIS — R600 Localized edema: Secondary | ICD-10-CM | POA: Diagnosis not present

## 2022-07-10 DIAGNOSIS — I340 Nonrheumatic mitral (valve) insufficiency: Secondary | ICD-10-CM | POA: Diagnosis not present

## 2022-07-10 MED ORDER — DENOSUMAB 60 MG/ML ~~LOC~~ SOSY
60.0000 mg | PREFILLED_SYRINGE | Freq: Once | SUBCUTANEOUS | Status: AC
  Administered 2022-07-10: 60 mg via SUBCUTANEOUS

## 2022-07-10 NOTE — Addendum Note (Signed)
Encounter addended by: Baxter Hire, RN on: 07/10/2022 9:54 AM  Actions taken: Therapy plan modified

## 2022-07-10 NOTE — Progress Notes (Signed)
Diagnosis: Osteoporosis  Provider:  Wende Neighbors MD  Procedure: Injection  Prolia (Denosumab), Dose: 60 mg, Site: subcutaneous, Number of injections: 1  Post Care: Patient declined observation  Discharge: Condition: Stable, Destination: Home . AVS Provided  Performed by:  Binnie Kand, RN

## 2022-07-11 DIAGNOSIS — M5416 Radiculopathy, lumbar region: Secondary | ICD-10-CM | POA: Diagnosis not present

## 2022-07-14 DIAGNOSIS — M2569 Stiffness of other specified joint, not elsewhere classified: Secondary | ICD-10-CM | POA: Diagnosis not present

## 2022-07-14 DIAGNOSIS — M545 Low back pain, unspecified: Secondary | ICD-10-CM | POA: Diagnosis not present

## 2022-07-14 DIAGNOSIS — M62561 Muscle wasting and atrophy, not elsewhere classified, right lower leg: Secondary | ICD-10-CM | POA: Diagnosis not present

## 2022-07-14 DIAGNOSIS — M62551 Muscle wasting and atrophy, not elsewhere classified, right thigh: Secondary | ICD-10-CM | POA: Diagnosis not present

## 2022-07-16 DIAGNOSIS — F0390 Unspecified dementia without behavioral disturbance: Secondary | ICD-10-CM | POA: Diagnosis not present

## 2022-07-16 DIAGNOSIS — I130 Hypertensive heart and chronic kidney disease with heart failure and stage 1 through stage 4 chronic kidney disease, or unspecified chronic kidney disease: Secondary | ICD-10-CM | POA: Diagnosis not present

## 2022-07-16 DIAGNOSIS — N1831 Chronic kidney disease, stage 3a: Secondary | ICD-10-CM | POA: Diagnosis not present

## 2022-07-16 DIAGNOSIS — E870 Hyperosmolality and hypernatremia: Secondary | ICD-10-CM | POA: Diagnosis not present

## 2022-07-16 DIAGNOSIS — J4540 Moderate persistent asthma, uncomplicated: Secondary | ICD-10-CM | POA: Diagnosis not present

## 2022-07-16 DIAGNOSIS — M545 Low back pain, unspecified: Secondary | ICD-10-CM | POA: Diagnosis not present

## 2022-07-16 DIAGNOSIS — R109 Unspecified abdominal pain: Secondary | ICD-10-CM | POA: Diagnosis not present

## 2022-07-16 DIAGNOSIS — I480 Paroxysmal atrial fibrillation: Secondary | ICD-10-CM | POA: Diagnosis not present

## 2022-07-16 DIAGNOSIS — I10 Essential (primary) hypertension: Secondary | ICD-10-CM | POA: Diagnosis not present

## 2022-07-16 DIAGNOSIS — G8929 Other chronic pain: Secondary | ICD-10-CM | POA: Diagnosis not present

## 2022-07-16 DIAGNOSIS — I5032 Chronic diastolic (congestive) heart failure: Secondary | ICD-10-CM | POA: Diagnosis not present

## 2022-07-17 DIAGNOSIS — M545 Low back pain, unspecified: Secondary | ICD-10-CM | POA: Diagnosis not present

## 2022-07-17 DIAGNOSIS — M62561 Muscle wasting and atrophy, not elsewhere classified, right lower leg: Secondary | ICD-10-CM | POA: Diagnosis not present

## 2022-07-17 DIAGNOSIS — M62551 Muscle wasting and atrophy, not elsewhere classified, right thigh: Secondary | ICD-10-CM | POA: Diagnosis not present

## 2022-07-17 DIAGNOSIS — M2569 Stiffness of other specified joint, not elsewhere classified: Secondary | ICD-10-CM | POA: Diagnosis not present

## 2022-07-18 ENCOUNTER — Encounter: Payer: Self-pay | Admitting: Cardiology

## 2022-07-18 DIAGNOSIS — M5416 Radiculopathy, lumbar region: Secondary | ICD-10-CM | POA: Diagnosis not present

## 2022-07-18 DIAGNOSIS — M533 Sacrococcygeal disorders, not elsewhere classified: Secondary | ICD-10-CM | POA: Diagnosis not present

## 2022-07-22 DIAGNOSIS — M62561 Muscle wasting and atrophy, not elsewhere classified, right lower leg: Secondary | ICD-10-CM | POA: Diagnosis not present

## 2022-07-22 DIAGNOSIS — M62551 Muscle wasting and atrophy, not elsewhere classified, right thigh: Secondary | ICD-10-CM | POA: Diagnosis not present

## 2022-07-22 DIAGNOSIS — M545 Low back pain, unspecified: Secondary | ICD-10-CM | POA: Diagnosis not present

## 2022-07-22 DIAGNOSIS — M2569 Stiffness of other specified joint, not elsewhere classified: Secondary | ICD-10-CM | POA: Diagnosis not present

## 2022-07-24 ENCOUNTER — Encounter: Payer: Self-pay | Admitting: Radiology

## 2022-07-25 DIAGNOSIS — M545 Low back pain, unspecified: Secondary | ICD-10-CM | POA: Diagnosis not present

## 2022-07-25 DIAGNOSIS — M2569 Stiffness of other specified joint, not elsewhere classified: Secondary | ICD-10-CM | POA: Diagnosis not present

## 2022-07-25 DIAGNOSIS — M62551 Muscle wasting and atrophy, not elsewhere classified, right thigh: Secondary | ICD-10-CM | POA: Diagnosis not present

## 2022-07-25 DIAGNOSIS — M62561 Muscle wasting and atrophy, not elsewhere classified, right lower leg: Secondary | ICD-10-CM | POA: Diagnosis not present

## 2022-07-29 DIAGNOSIS — M62561 Muscle wasting and atrophy, not elsewhere classified, right lower leg: Secondary | ICD-10-CM | POA: Diagnosis not present

## 2022-07-29 DIAGNOSIS — M545 Low back pain, unspecified: Secondary | ICD-10-CM | POA: Diagnosis not present

## 2022-07-29 DIAGNOSIS — M62551 Muscle wasting and atrophy, not elsewhere classified, right thigh: Secondary | ICD-10-CM | POA: Diagnosis not present

## 2022-07-29 DIAGNOSIS — M2569 Stiffness of other specified joint, not elsewhere classified: Secondary | ICD-10-CM | POA: Diagnosis not present

## 2022-07-31 ENCOUNTER — Ambulatory Visit (INDEPENDENT_AMBULATORY_CARE_PROVIDER_SITE_OTHER): Payer: PPO | Admitting: Gastroenterology

## 2022-07-31 ENCOUNTER — Encounter: Payer: Self-pay | Admitting: Gastroenterology

## 2022-07-31 VITALS — BP 145/56 | HR 44 | Temp 97.8°F | Ht 60.0 in | Wt 137.5 lb

## 2022-07-31 DIAGNOSIS — K625 Hemorrhage of anus and rectum: Secondary | ICD-10-CM | POA: Diagnosis not present

## 2022-07-31 DIAGNOSIS — K219 Gastro-esophageal reflux disease without esophagitis: Secondary | ICD-10-CM

## 2022-07-31 NOTE — Progress Notes (Signed)
Gastroenterology Office Note     Primary Care Physician:  Celene Squibb, MD  Primary Gastroenterologist: Dr. Abbey Chatters    Chief Complaint   Chief Complaint  Patient presents with   Follow-up    Patient here today for a 2-3 month follow up on her gerd, she is on protonix 40 mg once per day.     History of Present Illness   [name] is an 87 y.o. female presenting today in follow-up with a history of chronic GERD, polyps, recent colonoscopy Dec 2023 with serrated adenomas. No surveillance due to age.    Wheezing. Just took inhaler.   Was constipated whole life up until a few months ago. Now going a few times ago. Will get the urge to have a BM and has to go quickly. Some days will be regular, some days harder to go. Thinks she may have had a TIA a few weeks ago. Memory issues at times. No recent bleeding.    Colonoscopy Dec 2023: Non-bleeding internal hemorrhoids.                           - Diverticulosis in the sigmoid colon.                           - One 6 mm polyp in the cecum, removed with a cold                            snare. Resected and retrieved.                           - One 8 mm polyp in the rectum, removed with a cold                            snare. Resected and retrieved.                           - The examination was otherwise normal. Sessile serrated adenoma and traditional serrated adenoma    Past Medical History:  Diagnosis Date   Adenomatous colon polyp 2008   Anxiety    Asthma    has not needed in over a year   Complication of anesthesia    pt reports TIA and sezures after surgery   Coronary artery disease    CVA (cerebral vascular accident) ([place])    x 3   Degenerative disc disease, lumbar    Depression    Diverticulosis    Dysrhythmia    GERD (gastroesophageal reflux disease)    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55   age 70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 2 weeks   Hemorrhoids 2007   History of kidney stones    HTN (hypertension)     Hyperplastic colon polyp 2005   Hypothyroidism    Narcolepsy    PONV (postoperative nausea and vomiting)    TIA (transient ischemic attack)     Past Surgical History:  Procedure Laterality Date   ABDOMINAL HYSTERECTOMY     age 57, complicated by poor wound healing, followed by revision of scar and radiation for ?malignancy   BACK SURGERY  07/2018   lost 2 inches   BREAST ENHANCEMENT SURGERY     BREAST IMPLANT  EXCHANGE Right 06/23/2016   Procedure: RIGHT BREAST IMPLANT REMOVAL AND REPLACEMENT;  Surgeon: Cristine Polio, MD;  Location: [facility];  Service: Plastics;  Laterality: Right;   COLONOSCOPY  2005   2 hyperplastic polyps   COLONOSCOPY  2007   Dr. Oneida Alar- hemorrhoids   COLONOSCOPY  2008   Dr. Reed Pandy polyp- rare sigmoid diverticulosis, internal hemorrhoids   COLONOSCOPY  12/01/2011   Procedure: COLONOSCOPY;  Surgeon: Danie Binder, MD;  [facility];  Service: Endoscopy;  Laterality: N/A;  12:30 PM   COLONOSCOPY WITH PROPOFOL N/A 05/22/2022   Procedure: COLONOSCOPY WITH PROPOFOL;  Surgeon: Eloise Harman, DO;  [facility];  Service: Endoscopy;  Laterality: N/A;  1:00 PM   CYSTOSCOPY/URETEROSCOPY/HOLMIUM LASER/STENT PLACEMENT Left 02/29/2020   Procedure: CYSTOSCOPY/URETEROSCOPY/HOLMIUM LASER/STENT PLACEMENT;  Surgeon: Ceasar Mons, MD;  [facility];  Service: Urology;  Laterality: Left;   epidural steroid injection     She is getting injections with Dr. Nelva Bush q3wks.   ESOPHAGOGASTRODUODENOSCOPY (EGD) WITH ESOPHAGEAL DILATION N/A 04/15/2013   Procedure: ESOPHAGOGASTRODUODENOSCOPY (EGD) WITH ESOPHAGEAL DILATION;  Surgeon: Danie Binder, MD;  [facility];  Service: Endoscopy;  Laterality: N/A;  11:45-moved to 12:30 Darius Bump to notify pt   EYE SURGERY  2010   EYE SURGERY     IR KYPHO LUMBAR INC FX REDUCE BONE BX UNI/BIL CANNULATION INC/IMAGING  08/23/2018   IR RADIOLOGIST EVAL & MGMT  12/23/2018    POLYPECTOMY  05/22/2022   Procedure: POLYPECTOMY;  Surgeon: Eloise Harman, DO;  [facility];  Service: Endoscopy;;   right thumb surgery      Current Outpatient Medications  Medication Sig Dispense Refill   albuterol (PROVENTIL) (2.5 MG/3ML) 0.083% nebulizer solution Take 3 mLs (2.5 mg total) by nebulization every 6 (six) hours as needed for wheezing or shortness of breath. 75 mL 0   albuterol (VENTOLIN HFA) 108 (90 Base) MCG/ACT inhaler Inhale 1-2 puffs into the lungs every 6 (six) hours as needed for wheezing or shortness of breath.     Ascorbic Acid (VITAMIN C) 1000 MG tablet Take 1,000 mg by mouth daily.     B Complex Vitamins (VITAMIN B COMPLEX) TABS Take 1 tablet by mouth daily.     Biotin 300 MCG TABS Take 300 mcg by mouth daily after supper.      Calcium Carb-Cholecalciferol (CALCIUM 600 + D PO) Take 1 tablet by mouth in the morning and at bedtime.     ELIQUIS 5 MG TABS tablet Take 5 mg by mouth 2 (two) times daily.     ezetimibe (ZETIA) 10 MG tablet Take 1 tablet (10 mg total) by mouth daily. 30 tablet 0   Ferrous Gluconate (IRON) 240 (27 Fe) MG TABS Take 1 tablet by mouth daily. 30 tablet 0   flecainide (TAMBOCOR) 50 MG tablet TAKE 1 TABLET(50 MG) BY MOUTH TWICE DAILY 180 tablet 3   furosemide (LASIX) 40 MG tablet Take 1 1/2 tabs (60mg ) by mouth alternating every other day with 1 tab (40mg ) 115 tablet 3   levothyroxine (SYNTHROID) 75 MCG tablet Take 75 mcg by mouth daily before breakfast.     losartan (COZAAR) 25 MG tablet Take 25 mg by mouth daily.     Lutein 40 MG CAPS Take 40 mg by mouth daily.      Magnesium 250 MG TABS Take 250 mg by mouth daily.      memantine (NAMENDA) 10 MG tablet Take 1 tablet (10  mg total) by mouth 2 (two) times daily. (Patient taking differently: Take 5 mg by mouth 2 (two) times daily.) 180 tablet 3   metoprolol tartrate (LOPRESSOR) 25 MG tablet Take 1 tablet (25 mg total) by mouth 2 (two) times daily. (MAY TAKE AN ADDITIONAL 25MG  AS NEEDED  FOR PALPITATIONS) 90 tablet 6   Omega-3 Fatty Acids (FISH OIL) 1200 MG CAPS Take 1,200 mg by mouth daily.     OVER THE COUNTER MEDICATION daily at 6 (six) AM. Collagen     pantoprazole (PROTONIX) 40 MG tablet Take 1 tablet (40 mg total) by mouth daily.     potassium chloride (KLOR-CON M) 10 MEQ tablet Take 10 mEq by mouth 2 (two) times daily.     Turmeric 500 MG TABS Take 500 mg by mouth daily.      Vitamin D, Cholecalciferol, 25 MCG (1000 UT) TABS Take 1,000 Int'l Units/1.66m2 by mouth daily at 6 (six) AM.     vitamin E 180 MG (400 UNITS) capsule Take 400 Units by mouth daily.     Zinc 50 MG TABS Take 50 mg by mouth daily.      No current facility-administered medications for this visit.    Allergies as of 07/31/2022 - Review Complete 07/31/2022  Allergen Reaction Noted   Epinephrine Anaphylaxis 11/18/2011   Demerol [meperidine] Other (See Comments) 11/18/2011   Vicodin [hydrocodone-acetaminophen] Other (See Comments) 11/18/2011    Family History  Problem Relation Age of Onset   Colon cancer Father        age 55   Prostate cancer Father    Pancreatic cancer Mother        age 41    Social History   Socioeconomic History   Marital status: Widowed    Spouse name: Not on file   Number of children: 1   Years of education: 65   Highest education level: 12th grade  Occupational History   Not on file  Tobacco Use   Smoking status: Never    Passive exposure: Never   Smokeless tobacco: Never  Vaping Use   Vaping Use: Never used  Substance and Sexual Activity   Alcohol use: No   Drug use: No   Sexual activity: Not Currently    Partners: Male  Other Topics Concern   Not on file  Social History Narrative   Lives alone   Right Handed    Drinks no caffeine daily   Social Determinants of Health   Financial Resource Strain: Low Risk  (02/06/2022)   Overall Financial Resource Strain (CARDIA)    Difficulty of Paying Living Expenses: Not hard at all  Food Insecurity: No Food  Insecurity (02/06/2022)   Hunger Vital Sign    Worried About Running Out of Food in the Last Year: Never true    Ran Out of Food in the Last Year: Never true  Transportation Needs: No Transportation Needs (02/06/2022)   PRAPARE - Hydrologist (Medical): No    Lack of Transportation (Non-Medical): No  Physical Activity: Inactive (02/06/2022)   Exercise Vital Sign    Days of Exercise per Week: 0 days    Minutes of Exercise per Session: 0 min  Stress: No Stress Concern Present (02/06/2022)   [place]    Feeling of Stress : Only a little  Social Connections: Moderately Integrated (02/06/2022)   Social Connection and Isolation Panel [NHANES]    Frequency of Communication with Friends  and Family: More than three times a week    Frequency of Social Gatherings with Friends and Family: More than three times a week    Attends Religious Services: More than 4 times per year    Active Member of Clubs or Organizations: Yes    Attends Archivist Meetings: More than 4 times per year    Marital Status: Widowed  Intimate Partner Violence: Not At Risk (02/06/2022)   Humiliation, Afraid, Rape, and Kick questionnaire    Fear of Current or Ex-Partner: No    Emotionally Abused: No    Physically Abused: No    Sexually Abused: No     Review of Systems   Gen: Denies any fever, chills, fatigue, weight loss, lack of appetite.  CV: Denies chest pain, heart palpitations, peripheral edema, syncope.  Resp: Denies shortness of breath at rest or with exertion. Denies wheezing or cough.  GI: Denies dysphagia or odynophagia. Denies jaundice, hematemesis, fecal incontinence. GU : Denies urinary burning, urinary frequency, urinary hesitancy MS: Denies joint pain, muscle weakness, cramps, or limitation of movement.  Derm: Denies rash, itching, dry skin Psych: Denies depression, anxiety, memory loss, and  confusion Heme: Denies bruising, bleeding, and enlarged lymph nodes.   Physical Exam   BP (!) 145/56 (BP Location: Right Arm, Patient Position: Sitting, Cuff Size: Normal)   Pulse (!) 44   Temp 97.8 F (36.6 C) (Temporal)   Ht 5' (1.524 m)   Wt 137 lb 8 oz (62.4 kg)   BMI 26.85 kg/m  General:   Alert and oriented. Pleasant and cooperative. Well-nourished and well-developed.  Head:  Normocephalic and atraumatic. Eyes:  Without icterus Abdomen:  +BS, soft, non-tender and non-distended. No HSM noted. No guarding or rebound. No masses appreciated.  Rectal:  Deferred  Msk:  Symmetrical without gross deformities. Normal posture. Extremities:  Without edema. Neurologic:  Alert and  oriented x4;  grossly normal neurologically. Skin:  Intact without significant lesions or rashes. Psych:  Alert and cooperative. Normal mood and affect.   Assessment   [name] is an 87 y.o. female presenting today in follow-up for GERD. Recent colonoscopy with serrated adenomas but no surveillance due to age.  GERD: doing well on pantoprazole once daily.  Intermittent frequent stool alternating with constipation. Start Benefiber.  PLAN    Continue PPI daily Benefiber daily 3 month return   Annitta Needs, PhD, [facility]

## 2022-07-31 NOTE — Patient Instructions (Signed)
I recommend taking Benefiber 2 teaspoons mixed in beverage of your choice every day.  Continue Protonix once daily, 30 minutes before breakfast.   We will see you in 3 months!  It was a pleasure to see you today. I want to create trusting relationships with patients and provide genuine, compassionate, and quality care. I truly value your feedback, so please be on the lookout for a survey regarding your visit with me today. I appreciate your time in completing this!    Annitta Needs, PhD, ANP-BC [facility]

## 2022-08-04 DIAGNOSIS — M62561 Muscle wasting and atrophy, not elsewhere classified, right lower leg: Secondary | ICD-10-CM | POA: Diagnosis not present

## 2022-08-04 DIAGNOSIS — M62551 Muscle wasting and atrophy, not elsewhere classified, right thigh: Secondary | ICD-10-CM | POA: Diagnosis not present

## 2022-08-04 DIAGNOSIS — M2569 Stiffness of other specified joint, not elsewhere classified: Secondary | ICD-10-CM | POA: Diagnosis not present

## 2022-08-04 DIAGNOSIS — M545 Low back pain, unspecified: Secondary | ICD-10-CM | POA: Diagnosis not present

## 2022-08-07 DIAGNOSIS — M62551 Muscle wasting and atrophy, not elsewhere classified, right thigh: Secondary | ICD-10-CM | POA: Diagnosis not present

## 2022-08-07 DIAGNOSIS — M2569 Stiffness of other specified joint, not elsewhere classified: Secondary | ICD-10-CM | POA: Diagnosis not present

## 2022-08-07 DIAGNOSIS — M545 Low back pain, unspecified: Secondary | ICD-10-CM | POA: Diagnosis not present

## 2022-08-07 DIAGNOSIS — M62561 Muscle wasting and atrophy, not elsewhere classified, right lower leg: Secondary | ICD-10-CM | POA: Diagnosis not present

## 2022-08-12 DIAGNOSIS — M62551 Muscle wasting and atrophy, not elsewhere classified, right thigh: Secondary | ICD-10-CM | POA: Diagnosis not present

## 2022-08-12 DIAGNOSIS — M2569 Stiffness of other specified joint, not elsewhere classified: Secondary | ICD-10-CM | POA: Diagnosis not present

## 2022-08-12 DIAGNOSIS — M62561 Muscle wasting and atrophy, not elsewhere classified, right lower leg: Secondary | ICD-10-CM | POA: Diagnosis not present

## 2022-08-12 DIAGNOSIS — M545 Low back pain, unspecified: Secondary | ICD-10-CM | POA: Diagnosis not present

## 2022-08-14 DIAGNOSIS — M533 Sacrococcygeal disorders, not elsewhere classified: Secondary | ICD-10-CM | POA: Diagnosis not present

## 2022-08-15 DIAGNOSIS — M545 Low back pain, unspecified: Secondary | ICD-10-CM | POA: Diagnosis not present

## 2022-08-15 DIAGNOSIS — M62551 Muscle wasting and atrophy, not elsewhere classified, right thigh: Secondary | ICD-10-CM | POA: Diagnosis not present

## 2022-08-15 DIAGNOSIS — M2569 Stiffness of other specified joint, not elsewhere classified: Secondary | ICD-10-CM | POA: Diagnosis not present

## 2022-08-15 DIAGNOSIS — M62561 Muscle wasting and atrophy, not elsewhere classified, right lower leg: Secondary | ICD-10-CM | POA: Diagnosis not present

## 2022-08-19 DIAGNOSIS — M2569 Stiffness of other specified joint, not elsewhere classified: Secondary | ICD-10-CM | POA: Diagnosis not present

## 2022-08-19 DIAGNOSIS — M62551 Muscle wasting and atrophy, not elsewhere classified, right thigh: Secondary | ICD-10-CM | POA: Diagnosis not present

## 2022-08-19 DIAGNOSIS — M62561 Muscle wasting and atrophy, not elsewhere classified, right lower leg: Secondary | ICD-10-CM | POA: Diagnosis not present

## 2022-08-19 DIAGNOSIS — M545 Low back pain, unspecified: Secondary | ICD-10-CM | POA: Diagnosis not present

## 2022-08-22 DIAGNOSIS — M62561 Muscle wasting and atrophy, not elsewhere classified, right lower leg: Secondary | ICD-10-CM | POA: Diagnosis not present

## 2022-08-22 DIAGNOSIS — M2569 Stiffness of other specified joint, not elsewhere classified: Secondary | ICD-10-CM | POA: Diagnosis not present

## 2022-08-22 DIAGNOSIS — M545 Low back pain, unspecified: Secondary | ICD-10-CM | POA: Diagnosis not present

## 2022-08-22 DIAGNOSIS — M62551 Muscle wasting and atrophy, not elsewhere classified, right thigh: Secondary | ICD-10-CM | POA: Diagnosis not present

## 2022-09-02 DIAGNOSIS — R262 Difficulty in walking, not elsewhere classified: Secondary | ICD-10-CM | POA: Diagnosis not present

## 2022-09-02 DIAGNOSIS — M62561 Muscle wasting and atrophy, not elsewhere classified, right lower leg: Secondary | ICD-10-CM | POA: Diagnosis not present

## 2022-09-02 DIAGNOSIS — M62551 Muscle wasting and atrophy, not elsewhere classified, right thigh: Secondary | ICD-10-CM | POA: Diagnosis not present

## 2022-09-02 DIAGNOSIS — M2569 Stiffness of other specified joint, not elsewhere classified: Secondary | ICD-10-CM | POA: Diagnosis not present

## 2022-09-15 DIAGNOSIS — E870 Hyperosmolality and hypernatremia: Secondary | ICD-10-CM | POA: Diagnosis not present

## 2022-09-15 DIAGNOSIS — M25551 Pain in right hip: Secondary | ICD-10-CM | POA: Diagnosis not present

## 2022-09-15 DIAGNOSIS — M25552 Pain in left hip: Secondary | ICD-10-CM | POA: Diagnosis not present

## 2022-09-15 DIAGNOSIS — I129 Hypertensive chronic kidney disease with stage 1 through stage 4 chronic kidney disease, or unspecified chronic kidney disease: Secondary | ICD-10-CM | POA: Diagnosis not present

## 2022-09-15 DIAGNOSIS — R600 Localized edema: Secondary | ICD-10-CM | POA: Diagnosis not present

## 2022-09-15 DIAGNOSIS — N1831 Chronic kidney disease, stage 3a: Secondary | ICD-10-CM | POA: Diagnosis not present

## 2022-09-15 DIAGNOSIS — F0390 Unspecified dementia without behavioral disturbance: Secondary | ICD-10-CM | POA: Diagnosis not present

## 2022-09-15 DIAGNOSIS — M545 Low back pain, unspecified: Secondary | ICD-10-CM | POA: Diagnosis not present

## 2022-09-15 DIAGNOSIS — R109 Unspecified abdominal pain: Secondary | ICD-10-CM | POA: Diagnosis not present

## 2022-09-15 DIAGNOSIS — G8929 Other chronic pain: Secondary | ICD-10-CM | POA: Diagnosis not present

## 2022-09-15 DIAGNOSIS — J4540 Moderate persistent asthma, uncomplicated: Secondary | ICD-10-CM | POA: Diagnosis not present

## 2022-09-22 DIAGNOSIS — D6869 Other thrombophilia: Secondary | ICD-10-CM | POA: Diagnosis not present

## 2022-09-22 DIAGNOSIS — Z515 Encounter for palliative care: Secondary | ICD-10-CM | POA: Diagnosis not present

## 2022-09-22 DIAGNOSIS — Z7901 Long term (current) use of anticoagulants: Secondary | ICD-10-CM | POA: Diagnosis not present

## 2022-09-22 DIAGNOSIS — N1832 Chronic kidney disease, stage 3b: Secondary | ICD-10-CM | POA: Diagnosis not present

## 2022-09-22 DIAGNOSIS — I5032 Chronic diastolic (congestive) heart failure: Secondary | ICD-10-CM | POA: Diagnosis not present

## 2022-09-22 DIAGNOSIS — J4540 Moderate persistent asthma, uncomplicated: Secondary | ICD-10-CM | POA: Diagnosis not present

## 2022-09-22 DIAGNOSIS — I480 Paroxysmal atrial fibrillation: Secondary | ICD-10-CM | POA: Diagnosis not present

## 2022-09-22 DIAGNOSIS — E261 Secondary hyperaldosteronism: Secondary | ICD-10-CM | POA: Diagnosis not present

## 2022-09-26 DIAGNOSIS — R262 Difficulty in walking, not elsewhere classified: Secondary | ICD-10-CM | POA: Diagnosis not present

## 2022-09-26 DIAGNOSIS — M2569 Stiffness of other specified joint, not elsewhere classified: Secondary | ICD-10-CM | POA: Diagnosis not present

## 2022-09-26 DIAGNOSIS — M62551 Muscle wasting and atrophy, not elsewhere classified, right thigh: Secondary | ICD-10-CM | POA: Diagnosis not present

## 2022-09-26 DIAGNOSIS — M62561 Muscle wasting and atrophy, not elsewhere classified, right lower leg: Secondary | ICD-10-CM | POA: Diagnosis not present

## 2022-10-09 ENCOUNTER — Encounter: Payer: Self-pay | Admitting: Gastroenterology

## 2022-10-23 ENCOUNTER — Encounter (HOSPITAL_COMMUNITY): Payer: Self-pay | Admitting: Internal Medicine

## 2022-10-29 DIAGNOSIS — J019 Acute sinusitis, unspecified: Secondary | ICD-10-CM | POA: Diagnosis not present

## 2022-10-29 DIAGNOSIS — J4540 Moderate persistent asthma, uncomplicated: Secondary | ICD-10-CM | POA: Diagnosis not present

## 2022-11-06 ENCOUNTER — Encounter: Payer: Self-pay | Admitting: Gastroenterology

## 2022-11-06 ENCOUNTER — Ambulatory Visit (INDEPENDENT_AMBULATORY_CARE_PROVIDER_SITE_OTHER): Payer: PPO | Admitting: Gastroenterology

## 2022-11-06 VITALS — BP 146/63 | HR 45 | Temp 97.8°F | Ht <= 58 in | Wt 132.8 lb

## 2022-11-06 DIAGNOSIS — R140 Abdominal distension (gaseous): Secondary | ICD-10-CM | POA: Diagnosis not present

## 2022-11-06 DIAGNOSIS — R634 Abnormal weight loss: Secondary | ICD-10-CM

## 2022-11-06 NOTE — Progress Notes (Signed)
Gastroenterology Office Note     Primary Care Physician:  Benita Stabile, MD  Primary Gastroenterologist: Dr. Marletta Lor    Chief Complaint   Chief Complaint  Patient presents with   Follow-up    Belly always distended even if she has to go or not.      History of Present Illness   [name] is an 87 y.o. female  with a history of chronic GERD, polyps, recent colonoscopy Dec 2023 with serrated adenomas. No surveillance due to age.   Last seen March 2024 with intermittent frequent stool alternating with constipation, recommending Benefiber. Denies constipation currently. Has BM a few times a day, no diarrhea, feels well. Feels bloated in upper abdomen. No postprandial abdominal pain. Doesn't feel hungry. Feels like from under her breast to umbilicus is pooched out. Clothes don't fit right. GERD controlled. No dysphagia. Feels fullness in upper abdomen. Mother passed away with pancreatic cancer and father with colon cancer. Multiple aunts/uncles with cancers. Not interested in EGD just yet.,   I note she has had weight loss of about 10 lbs over the past year and 5 lbs since seen in March 2024. Typical weight in the past has been in low 140s. Now 132.      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38   age 43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 2 weeks   Hemorrhoids 2007   History of kidney stones    HTN (hypertension)    Hyperplastic colon polyp 2005   Hypothyroidism    Narcolepsy    PONV (postoperative nausea and vomiting)    TIA (transient ischemic attack)     Past Surgical History:  Procedure  Laterality Date   ABDOMINAL HYSTERECTOMY     age 54, complicated by poor wound healing, followed by revision of scar and radiation for ?malignancy   BACK SURGERY  07/2018   lost 2 inches   BREAST ENHANCEMENT SURGERY     BREAST IMPLANT EXCHANGE Right 06/23/2016   Procedure: RIGHT BREAST IMPLANT REMOVAL AND REPLACEMENT;  Surgeon: Louisa Second, MD;  Location: [facility];  Service: Plastics;  Laterality: Right;   COLONOSCOPY  2005   2 hyperplastic polyps   COLONOSCOPY  2007   Dr. Darrick Penna- hemorrhoids   COLONOSCOPY  2008   Dr. Steva Ready polyp- rare sigmoid diverticulosis, internal hemorrhoids   COLONOSCOPY  12/01/2011   Procedure: COLONOSCOPY;  Surgeon: West Bali, MD;  [facility];  Service: Endoscopy;  Laterality: N/A;  12:30 PM   COLONOSCOPY WITH PROPOFOL N/A 05/22/2022   Procedure: COLONOSCOPY WITH PROPOFOL;  Surgeon: Lanelle Bal, DO;  [facility];  Service: Endoscopy;  Laterality: N/A;  1:00 PM   CYSTOSCOPY/URETEROSCOPY/HOLMIUM LASER/STENT PLACEMENT Left 02/29/2020   Procedure: CYSTOSCOPY/URETEROSCOPY/HOLMIUM LASER/STENT PLACEMENT;  Surgeon: Rene Paci, MD;  [facility];  Service: Urology;  Laterality: Left;   epidural steroid injection     She is getting injections with Dr. Ethelene Hal q3wks.  ESOPHAGOGASTRODUODENOSCOPY (EGD) WITH ESOPHAGEAL DILATION N/A 04/15/2013   Procedure: ESOPHAGOGASTRODUODENOSCOPY (EGD) WITH ESOPHAGEAL DILATION;  Surgeon: West Bali, MD;  [facility];  Service: Endoscopy;  Laterality: N/A;  11:45-moved to 12:30 [name] to notify pt   EYE SURGERY  2010   EYE SURGERY     IR KYPHO LUMBAR INC FX REDUCE BONE BX UNI/BIL CANNULATION INC/IMAGING  08/23/2018   IR RADIOLOGIST EVAL & MGMT  12/23/2018   POLYPECTOMY  05/22/2022   Procedure: POLYPECTOMY;  Surgeon: Lanelle Bal, DO;  [facility];  Service: Endoscopy;;   right thumb surgery      Current Outpatient  Medications  Medication Sig Dispense Refill   albuterol (PROVENTIL) (2.5 MG/3ML) 0.083% nebulizer solution Take 3 mLs (2.5 mg total) by nebulization every 6 (six) hours as needed for wheezing or shortness of breath. 75 mL 0   albuterol (VENTOLIN HFA) 108 (90 Base) MCG/ACT inhaler Inhale 1-2 puffs into the lungs every 6 (six) hours as needed for wheezing or shortness of breath.     Ascorbic Acid (VITAMIN C) 1000 MG tablet Take 1,000 mg by mouth daily.     B Complex Vitamins (VITAMIN B COMPLEX) TABS Take 1 tablet by mouth daily.     Biotin 300 MCG TABS Take 300 mcg by mouth daily after supper.      Calcium Carb-Cholecalciferol (CALCIUM 600 + D PO) Take 1 tablet by mouth in the morning and at bedtime.     ELIQUIS 5 MG TABS tablet Take 5 mg by mouth 2 (two) times daily.     ezetimibe (ZETIA) 10 MG tablet Take 1 tablet (10 mg total) by mouth daily. 30 tablet 0   famotidine (PEPCID) 20 MG tablet Take 20 mg by mouth daily.     Ferrous Gluconate (IRON) 240 (27 Fe) MG TABS Take 1 tablet by mouth daily. 30 tablet 0   Flaxseed, Linseed, (FLAXSEED OIL) 1000 MG CAPS Take 1 capsule by mouth daily.     flecainide (TAMBOCOR) 50 MG tablet TAKE 1 TABLET(50 MG) BY MOUTH TWICE DAILY 180 tablet 3   furosemide (LASIX) 40 MG tablet Take 1 1/2 tabs (60mg ) by mouth alternating every other day with 1 tab (40mg ) 115 tablet 3   levothyroxine (SYNTHROID) 75 MCG tablet Take 75 mcg by mouth daily before breakfast.     losartan (COZAAR) 25 MG tablet Take 25 mg by mouth daily.     Lutein 40 MG CAPS Take 40 mg by mouth daily.      Magnesium 250 MG TABS Take 250 mg by mouth daily.      memantine (NAMENDA) 5 MG tablet Take 5 mg by mouth 2 (two) times daily.     metoprolol tartrate (LOPRESSOR) 25 MG tablet Take 1 tablet (25 mg total) by mouth 2 (two) times daily. (MAY TAKE AN ADDITIONAL 25MG  AS NEEDED FOR PALPITATIONS) 90 tablet 6   Omega-3 Fatty Acids (FISH OIL) 1200 MG CAPS Take 1,200 mg by mouth daily.     OVER THE COUNTER  MEDICATION daily at 6 (six) AM. Collagen     pantoprazole (PROTONIX) 40 MG tablet Take 1 tablet (40 mg total) by mouth daily.     potassium chloride (KLOR-CON M) 10 MEQ tablet Take 10 mEq by mouth 2 (two) times daily.     Turmeric 500 MG TABS Take 500 mg by mouth daily.      Vitamin D, Cholecalciferol, 25 MCG (1000 UT) TABS Take 1,000 Int'l Units/1.15m2 by mouth daily  at 6 (six) AM.     vitamin E 180 MG (400 UNITS) capsule Take 400 Units by mouth daily.     Zinc 50 MG TABS Take 50 mg by mouth daily.      No current facility-administered medications for this visit.    Allergies as of 11/06/2022 - Review Complete 11/06/2022  Allergen Reaction Noted   Epinephrine Anaphylaxis 11/18/2011   Demerol [meperidine] Other (See Comments) 11/18/2011   Vicodin [hydrocodone-acetaminophen] Other (See Comments) 11/18/2011    Family History  Problem Relation Age of Onset   Colon cancer Father        age 70   Prostate cancer Father    Pancreatic cancer Mother        age 28    Social History   Socioeconomic History   Marital status: Widowed    Spouse name: Not on file   Number of children: 1   Years of education: 24   Highest education level: 12th grade  Occupational History   Not on file  Tobacco Use   Smoking status: Never    Passive exposure: Never   Smokeless tobacco: Never  Vaping Use   Vaping Use: Never used  Substance and Sexual Activity   Alcohol use: No   Drug use: No   Sexual activity: Not Currently    Partners: Male  Other Topics Concern   Not on file  Social History Narrative   Lives alone   Right Handed    Drinks no caffeine daily   Social Determinants of Health   Financial Resource Strain: Low Risk  (02/06/2022)   Overall Financial Resource Strain (CARDIA)    Difficulty of Paying Living Expenses: Not hard at all  Food Insecurity: No Food Insecurity (02/06/2022)   Hunger Vital Sign    Worried About Running Out of Food in the Last Year: Never true    Ran Out of  Food in the Last Year: Never true  Transportation Needs: No Transportation Needs (02/06/2022)   PRAPARE - Administrator, Civil Service (Medical): No    Lack of Transportation (Non-Medical): No  Physical Activity: Inactive (02/06/2022)   Exercise Vital Sign    Days of Exercise per Week: 0 days    Minutes of Exercise per Session: 0 min  Stress: No Stress Concern Present (02/06/2022)   [employer] of Occupational Health - Occupational Stress Questionnaire    Feeling of Stress : Only a little  Social Connections: Moderately Integrated (02/06/2022)   Social Connection and Isolation Panel [NHANES]    Frequency of Communication with Friends and Family: More than three times a week    Frequency of Social Gatherings with Friends and Family: More than three times a week    Attends Religious Services: More than 4 times per year    Active Member of [employer] or Organizations: Yes    Attends Banker Meetings: More than 4 times per year    Marital Status: Widowed  Intimate Partner Violence: Not At Risk (02/06/2022)   Humiliation, Afraid, Rape, and Kick questionnaire    Fear of Current or Ex-Partner: No    Emotionally Abused: No    Physically Abused: No    Sexually Abused: No     Review of Systems   Gen: see HPI  CV: Denies chest pain, heart palpitations, peripheral edema, syncope.  Resp: Denies shortness of breath at rest or with exertion. Denies wheezing or cough.  GI:see HPI GU : Denies urinary burning, urinary frequency, urinary  hesitancy MS: Denies joint pain, muscle weakness, cramps, or limitation of movement.  Derm: Denies rash, itching, dry skin Psych: Denies depression, anxiety, memory loss, and confusion Heme: Denies bruising, bleeding, and enlarged lymph nodes.   Physical Exam   BP (!) 91/53 (BP Location: Right Arm, Patient Position: Sitting, Cuff Size: Normal)   Pulse (!) 42   Temp 97.8 F (36.6 C) (Oral)   Ht 4\' 9"  (1.448 m)   Wt 132 lb 12.8 oz  (60.2 kg)   SpO2 97%   BMI 28.74 kg/m  General:   Alert and oriented. Pleasant and cooperative. Frail Head:  Normocephalic and atraumatic. Eyes:  Without icterus Abdomen:  +BS, soft, TTP LUQ and non-distended. No HSM noted. No guarding or rebound. No masses appreciated.  Rectal:  Deferred  Msk:  with kyphosis Extremities:  Without edema. Neurologic:  Alert and  oriented x4;  grossly normal neurologically. Skin:  Intact without significant lesions or rashes. Psych:  Alert and cooperative. Normal mood and affect.   Assessment   [name] is an  87 y.o. female  with a history of chronic GERD, polyps, recent colonoscopy Dec 2023 with serrated adenomas. No surveillance due to age.   Persistent weight loss unintentionally noted, along with vague reports of abdominal bloating in upper abdomen and denying any pain or food aversions. However, she does not decreased appetite. We discussed updating EGD, but she would like to avoid this. She is tender on exam in LUQ. Notable hx of pancreatic cancer in mother. She is concerned about occult malignancy. Symptoms not entirely consistent with chronic mesenteric ischemia.   GERD remains controlled on once daily PPI.    PLAN    CT abdomen with/without contrast BMP Consider EGD May need low FODMAP diet 6 week follow-up   Gelene Mink, PhD, [facility]

## 2022-11-06 NOTE — Patient Instructions (Signed)
We are arranging a special CT scan in the near future! You will need to have blood work done prior to this.  We will see you in 6 weeks!  I enjoyed seeing you again today! I value our relationship and want to provide genuine, compassionate, and quality care. You may receive a survey regarding your visit with me, and I welcome your feedback! Thanks so much for taking the time to complete this. I look forward to seeing you again.      Gelene Mink, PhD, ANP-BC [facility]

## 2022-11-10 ENCOUNTER — Encounter (HOSPITAL_COMMUNITY): Payer: Self-pay | Admitting: Radiology

## 2022-11-10 ENCOUNTER — Ambulatory Visit (HOSPITAL_COMMUNITY)
Admission: RE | Admit: 2022-11-10 | Discharge: 2022-11-10 | Disposition: A | Discharge status: Home / Self Care | Payer: PPO | Source: Ambulatory Visit | Attending: Gastroenterology | Admitting: Gastroenterology

## 2022-11-10 DIAGNOSIS — R634 Abnormal weight loss: Secondary | ICD-10-CM | POA: Diagnosis not present

## 2022-11-10 DIAGNOSIS — R140 Abdominal distension (gaseous): Secondary | ICD-10-CM

## 2022-11-10 DIAGNOSIS — N281 Cyst of kidney, acquired: Secondary | ICD-10-CM | POA: Diagnosis not present

## 2022-11-10 DIAGNOSIS — K8020 Calculus of gallbladder without cholecystitis without obstruction: Secondary | ICD-10-CM | POA: Diagnosis not present

## 2022-11-10 DIAGNOSIS — K5730 Diverticulosis of large intestine without perforation or abscess without bleeding: Secondary | ICD-10-CM | POA: Diagnosis not present

## 2022-11-10 DIAGNOSIS — N200 Calculus of kidney: Secondary | ICD-10-CM | POA: Diagnosis not present

## 2022-11-10 LAB — POCT I-STAT CREATININE: Creatinine, Ser: 1.6 mg/dL — ABNORMAL HIGH (ref 0.44–1.00)

## 2022-11-10 MED ORDER — IOHEXOL 300 MG/ML  SOLN
80.0000 mL | Freq: Once | INTRAMUSCULAR | Status: AC | PRN
  Administered 2022-11-10: 80 mL via INTRAVENOUS

## 2022-11-13 DIAGNOSIS — Z961 Presence of intraocular lens: Secondary | ICD-10-CM | POA: Diagnosis not present

## 2022-11-13 DIAGNOSIS — H524 Presbyopia: Secondary | ICD-10-CM | POA: Diagnosis not present

## 2022-11-13 DIAGNOSIS — H5203 Hypermetropia, bilateral: Secondary | ICD-10-CM | POA: Diagnosis not present

## 2022-11-13 DIAGNOSIS — H52203 Unspecified astigmatism, bilateral: Secondary | ICD-10-CM | POA: Diagnosis not present

## 2022-11-13 DIAGNOSIS — H50111 Monocular exotropia, right eye: Secondary | ICD-10-CM | POA: Diagnosis not present

## 2022-11-18 DIAGNOSIS — Q6239 Other obstructive defects of renal pelvis and ureter: Secondary | ICD-10-CM | POA: Diagnosis not present

## 2022-11-21 ENCOUNTER — Emergency Department (HOSPITAL_COMMUNITY)
Admission: EM | Admit: 2022-11-21 | Discharge: 2022-11-21 | Disposition: A | Discharge status: Home / Self Care | Payer: PPO | Attending: Emergency Medicine | Admitting: Emergency Medicine

## 2022-11-21 ENCOUNTER — Emergency Department (HOSPITAL_COMMUNITY): Payer: PPO

## 2022-11-21 ENCOUNTER — Other Ambulatory Visit: Payer: Self-pay

## 2022-11-21 ENCOUNTER — Encounter (HOSPITAL_COMMUNITY): Payer: Self-pay | Admitting: Emergency Medicine

## 2022-11-21 DIAGNOSIS — Z8673 Personal history of transient ischemic attack (TIA), and cerebral infarction without residual deficits: Secondary | ICD-10-CM | POA: Diagnosis not present

## 2022-11-21 DIAGNOSIS — N200 Calculus of kidney: Secondary | ICD-10-CM | POA: Diagnosis not present

## 2022-11-21 DIAGNOSIS — Z853 Personal history of malignant neoplasm of breast: Secondary | ICD-10-CM | POA: Insufficient documentation

## 2022-11-21 DIAGNOSIS — R001 Bradycardia, unspecified: Secondary | ICD-10-CM | POA: Diagnosis not present

## 2022-11-21 DIAGNOSIS — R1030 Lower abdominal pain, unspecified: Secondary | ICD-10-CM | POA: Diagnosis present

## 2022-11-21 DIAGNOSIS — R457 State of emotional shock and stress, unspecified: Secondary | ICD-10-CM | POA: Diagnosis not present

## 2022-11-21 DIAGNOSIS — Z7901 Long term (current) use of anticoagulants: Secondary | ICD-10-CM | POA: Insufficient documentation

## 2022-11-21 DIAGNOSIS — K5790 Diverticulosis of intestine, part unspecified, without perforation or abscess without bleeding: Secondary | ICD-10-CM

## 2022-11-21 DIAGNOSIS — I10 Essential (primary) hypertension: Secondary | ICD-10-CM | POA: Diagnosis not present

## 2022-11-21 DIAGNOSIS — K5730 Diverticulosis of large intestine without perforation or abscess without bleeding: Secondary | ICD-10-CM | POA: Diagnosis not present

## 2022-11-21 DIAGNOSIS — R1031 Right lower quadrant pain: Secondary | ICD-10-CM | POA: Diagnosis not present

## 2022-11-21 DIAGNOSIS — R1032 Left lower quadrant pain: Secondary | ICD-10-CM | POA: Diagnosis not present

## 2022-11-21 LAB — COMPREHENSIVE METABOLIC PANEL
ALT: 18 U/L (ref 0–44)
AST: 22 U/L (ref 15–41)
Albumin: 3.9 g/dL (ref 3.5–5.0)
Alkaline Phosphatase: 59 U/L (ref 38–126)
Anion gap: 10 (ref 5–15)
BUN: 38 mg/dL — ABNORMAL HIGH (ref 8–23)
CO2: 34 mmol/L — ABNORMAL HIGH (ref 22–32)
Calcium: 10.8 mg/dL — ABNORMAL HIGH (ref 8.9–10.3)
Chloride: 99 mmol/L (ref 98–111)
Creatinine, Ser: 1.79 mg/dL — ABNORMAL HIGH (ref 0.44–1.00)
GFR, Estimated: 27 mL/min — ABNORMAL LOW (ref >60)
Glucose, Bld: 105 mg/dL — ABNORMAL HIGH (ref 70–99)
Potassium: 4.3 mmol/L (ref 3.5–5.1)
Sodium: 143 mmol/L (ref 135–145)
Total Bilirubin: 0.7 mg/dL (ref 0.3–1.2)
Total Protein: 6.6 g/dL (ref 6.5–8.1)

## 2022-11-21 LAB — CBC WITH DIFFERENTIAL/PLATELET
Abs Immature Granulocytes: 0.02 10*3/uL (ref 0.00–0.07)
Basophils Absolute: 0.1 10*3/uL (ref 0.0–0.1)
Basophils Relative: 1 %
Eosinophils Absolute: 0.4 10*3/uL (ref 0.0–0.5)
Eosinophils Relative: 5 %
HCT: 43.9 % (ref 36.0–46.0)
Hemoglobin: 13.9 g/dL (ref 12.0–15.0)
Immature Granulocytes: 0 %
Lymphocytes Relative: 19 %
Lymphs Abs: 1.5 10*3/uL (ref 0.7–4.0)
MCH: 28.9 pg (ref 26.0–34.0)
MCHC: 31.7 g/dL (ref 30.0–36.0)
MCV: 91.3 fL (ref 80.0–100.0)
Monocytes Absolute: 0.7 10*3/uL (ref 0.1–1.0)
Monocytes Relative: 10 %
Neutro Abs: 5 10*3/uL (ref 1.7–7.7)
Neutrophils Relative %: 65 %
Platelets: 216 10*3/uL (ref 150–400)
RBC: 4.81 MIL/uL (ref 3.87–5.11)
RDW: 14.8 % (ref 11.5–15.5)
WBC: 7.6 10*3/uL (ref 4.0–10.5)
nRBC: 0 % (ref 0.0–0.2)

## 2022-11-21 LAB — URINALYSIS, ROUTINE W REFLEX MICROSCOPIC
Bilirubin Urine: NEGATIVE
Glucose, UA: NEGATIVE mg/dL
Hgb urine dipstick: NEGATIVE
Ketones, ur: NEGATIVE mg/dL
Leukocytes,Ua: NEGATIVE
Nitrite: NEGATIVE
Protein, ur: NEGATIVE mg/dL
Specific Gravity, Urine: 1.006 (ref 1.005–1.030)
pH: 8 (ref 5.0–8.0)

## 2022-11-21 LAB — LIPASE, BLOOD: Lipase: 65 U/L — ABNORMAL HIGH (ref 11–51)

## 2022-11-21 LAB — LACTIC ACID, PLASMA
Lactic Acid, Venous: 1.4 mmol/L (ref 0.5–1.9)
Lactic Acid, Venous: 1.9 mmol/L (ref 0.5–1.9)

## 2022-11-21 MED ORDER — ACETAMINOPHEN 325 MG PO TABS
650.0000 mg | ORAL_TABLET | Freq: Once | ORAL | Status: AC
  Administered 2022-11-21: 650 mg via ORAL
  Filled 2022-11-21: qty 2

## 2022-11-21 NOTE — ED Triage Notes (Signed)
Pt coming from the eye Dr. Susa [facility] with reports of right and left sided lower abdominal pain. Denies N/V/D.

## 2022-11-21 NOTE — Discharge Instructions (Signed)
Please follow-up with your primary care provider recent symptoms and ER visit.  I have also attached for you a urologist to follow-up with for your kidney stones along with a GI specialist for your diverticulosis.  You may take Tylenol every 6 hours needed for pain.  If symptoms are to change or worsen please return to ER.

## 2022-11-21 NOTE — ED Provider Notes (Signed)
[place] EMERGENCY DEPARTMENT AT [facility] Provider Note   CSN: [id number] Arrival date & time: 11/21/22  1206     History  Chief Complaint  Patient presents with   Abdominal Pain    [name] is a 87 y.o. female history of chronic anticoagulation on warfarin, breast cancer, CVA, thrombophilia, renal stones, seizures, A-fib presented with lower abdominal pain that began last night.  Patient was at her eye doctor appointment today in which she began having a progression in her lower abdominal pain and came to the ER to be evaluated.  Patient states that she is full sensation distally has been able to walk.  Patient states that her abdomen appears more distended than normal and denies history of cirrhosis.  Patient denies nausea vomiting diarrhea, change in sensation/motor skills, new onset weakness, chest pain, shortness of breath, LOC, fatigue.  Patient has not tried any pain meds.  Patient stated that she is still having regular bowel movements along with urination without hematuria/hematochezia/diarrhea/constipation/dysuria. Patient states she still has her gallbladder and appendix.  Last echo: 03/07/2022 LVEF 60 to 65%, mild mitral valve regurg/mitral stenosis, grade 3 diastolic dysfunction  Home Medications Prior to Admission medications   Medication Sig Start Date End Date Taking? Authorizing Provider  albuterol (PROVENTIL) (2.5 MG/3ML) 0.083% nebulizer solution Take 3 mLs (2.5 mg total) by nebulization every 6 (six) hours as needed for wheezing or shortness of breath. 09/08/18   Sharee Holster, NP  albuterol (VENTOLIN HFA) 108 (90 Base) MCG/ACT inhaler Inhale 1-2 puffs into the lungs every 6 (six) hours as needed for wheezing or shortness of breath.    [provider]  Ascorbic Acid (VITAMIN C) 1000 MG tablet Take 1,000 mg by mouth daily.    [provider]  B Complex Vitamins (VITAMIN B COMPLEX) TABS Take 1 tablet by mouth daily.     [provider]  Biotin 300 MCG TABS Take 300 mcg by mouth daily after supper.     [provider]  Calcium Carb-Cholecalciferol (CALCIUM 600 + D PO) Take 1 tablet by mouth in the morning and at bedtime.    [provider]  ELIQUIS 5 MG TABS tablet Take 5 mg by mouth 2 (two) times daily. 08/19/21   [provider]  ezetimibe (ZETIA) 10 MG tablet Take 1 tablet (10 mg total) by mouth daily. 09/08/18   Sharee Holster, NP  famotidine (PEPCID) 20 MG tablet Take 20 mg by mouth daily. 10/15/22   [provider]  Ferrous Gluconate (IRON) 240 (27 Fe) MG TABS Take 1 tablet by mouth daily. 09/08/18   Sharee Holster, NP  Flaxseed, Linseed, (FLAXSEED OIL) 1000 MG CAPS Take 1 capsule by mouth daily.    [provider]  flecainide (TAMBOCOR) 50 MG tablet TAKE 1 TABLET(50 MG) BY MOUTH TWICE DAILY 02/11/22   Marinus Maw, MD  furosemide (LASIX) 40 MG tablet Take 1 1/2 tabs (60mg ) by mouth alternating every other day with 1 tab (40mg ) 06/26/22   Antoine Poche, MD  levothyroxine (SYNTHROID) 75 MCG tablet Take 75 mcg by mouth daily before breakfast.    [provider]  losartan (COZAAR) 25 MG tablet Take 25 mg by mouth daily.    [provider]  Lutein 40 MG CAPS Take 40 mg by mouth daily.  08/24/18   [provider]  Magnesium 250 MG TABS Take 250 mg by mouth daily.     [provider]  memantine (  NAMENDA) 5 MG tablet Take 5 mg by mouth 2 (two) times daily. 11/03/22   [provider]  metoprolol tartrate (LOPRESSOR) 25 MG tablet Take 1 tablet (25 mg total) by mouth 2 (two) times daily. (MAY TAKE AN ADDITIONAL 25MG  AS NEEDED FOR PALPITATIONS) 02/21/21   Antoine Poche, MD  Omega-3 Fatty Acids (FISH OIL) 1200 MG CAPS Take 1,200 mg by mouth daily.    [provider]  OVER THE COUNTER MEDICATION daily at 6 (six) AM. Collagen    [provider]  pantoprazole (PROTONIX) 40 MG tablet Take 1 tablet (40  mg total) by mouth daily. 12/18/18   Johnson, Clanford L, MD  potassium chloride (KLOR-CON M) 10 MEQ tablet Take 10 mEq by mouth 2 (two) times daily. 06/10/21   [provider]  Turmeric 500 MG TABS Take 500 mg by mouth daily.     [provider]  Vitamin D, Cholecalciferol, 25 MCG (1000 UT) TABS Take 1,000 Int'l Units/1.90m2 by mouth daily at 6 (six) AM.    [provider]  vitamin E 180 MG (400 UNITS) capsule Take 400 Units by mouth daily.    [provider]  Zinc 50 MG TABS Take 50 mg by mouth daily.     [provider]      Allergies    Epinephrine, Demerol [meperidine], and Vicodin [hydrocodone-acetaminophen]    Review of Systems   Review of Systems  Gastrointestinal:  Positive for abdominal pain.    Physical Exam Updated Vital Signs BP (!) 154/73   Pulse 71   Temp 97.8 F (36.6 C) (Oral)   Resp 19   SpO2 98%  Physical Exam Vitals reviewed.  Constitutional:      General: She is not in acute distress. HENT:     Head: Normocephalic and atraumatic.  Eyes:     Extraocular Movements: Extraocular movements intact.     Conjunctiva/sclera: Conjunctivae normal.     Pupils: Pupils are equal, round, and reactive to light.  Cardiovascular:     Rate and Rhythm: Normal rate and regular rhythm.     Pulses: Normal pulses.     Heart sounds: Normal heart sounds.     Comments: 2+ bilateral radial/dorsalis pedis pulses with regular rate Pulmonary:     Effort: Pulmonary effort is normal. No respiratory distress.     Breath sounds: Normal breath sounds.  Abdominal:     General: There is distension.     Palpations: Abdomen is soft.     Tenderness: There is abdominal tenderness (Suprapubic). There is no guarding or rebound.  Musculoskeletal:        General: Normal range of motion.     Cervical back: Normal range of motion and neck supple.     Comments: 5 out of 5 bilateral grip/leg extension strength  Skin:    General: Skin is warm and dry.      Capillary Refill: Capillary refill takes less than 2 seconds.  Neurological:     General: No focal deficit present.     Mental Status: She is alert and oriented to person, place, and time.     Comments: Sensation intact in all 4 limbs  Psychiatric:        Mood and Affect: Mood normal.     ED Results / Procedures / Treatments   Labs (all labs ordered are listed, but only abnormal results are displayed) Labs Reviewed  COMPREHENSIVE METABOLIC PANEL - Abnormal; Notable for the following components:  Result Value   CO2 34 (*)    Glucose, Bld 105 (*)    BUN 38 (*)    Creatinine, Ser 1.79 (*)    Calcium 10.8 (*)    GFR, Estimated 27 (*)    All other components within normal limits  LIPASE, BLOOD - Abnormal; Notable for the following components:   Lipase 65 (*)    All other components within normal limits  CBC WITH DIFFERENTIAL/PLATELET  URINALYSIS, ROUTINE W REFLEX MICROSCOPIC  LACTIC ACID, PLASMA  LACTIC ACID, PLASMA    EKG None  Radiology No results found.  Procedures Procedures    Medications Ordered in ED Medications  acetaminophen (TYLENOL) tablet 650 mg (has no administration in time range)    ED Course/ Medical Decision Making/ A&P                             Medical Decision Making Amount and/or Complexity of Data Reviewed Labs: ordered. Radiology: ordered.   JENETTA DOHM 87 y.o. presented today for lower abdominal pain. Working DDx that I considered at this time includes, but not limited to, gastroenteritis, colitis, small bowel obstruction, appendicitis, cholecystitis, pancreatitis, nephrolithiasis, AAA, UTI, pyelonephritis, mesenteric ischemia, cirrhosis, diverticulosis.  R/o DDx: gastroenteritis, colitis, small bowel obstruction, appendicitis, cholecystitis, pancreatitis, AAA, UTI, pyelonephritis, mesenteric ischemia, cirrhosis: These are considered less likely due to history of present illness and physical exam findings.  Review of  prior external notes: 11/06/2022 progress Notes  Unique Tests and My Interpretation:  CBC with differential: Unremarkable CMP: GFR 27, creatinine 1.79, BUN 38; these results are similar to previous readings Lipase: 65 UA: Unremarkable Lactic acid:1.4, 1.9 EKG: Rate, rhythm, axis, intervals all examined and without medically relevant abnormality. ST segments without concerns for elevations CT Abd/Pelvis wo contrast:. Colonic diverticulosis without acute diverticulitis, Bilateral nonobstructing renal stone, mild pulmonary edema  Discussion with Independent Historian: None  Discussion of Management of Tests: None  Risk: Medium: prescription drug management  Risk Stratification Score: None  Staffed with Zammit, MD   Plan: On exam patient was in no acute distress and stable vitals.  Patient did have mildly distended abdomen and was tender in the suprapubic region without peritoneal signs.  The rest patient's physical exam was unremarkable.  Patient stated that she does not want to have any strong pain meds at this time and so abdominal labs will be drawn without pain meds.  Patient was also endorsing nausea and Zofran was withheld at this time.  CT scan ordered and patient will be monitored.  Patient has allergy to Vicodin however in the chart it states that she is able to tolerate Tylenol and patient verbalized that she can take Tylenol without issue and so Tylenol will be ordered.  Patient stable at this time.  Patient's GFR was 27 and so we cannot proceed with contrast.  CT abdomen pelvis without contrast was ordered to fully evaluate.  Patient stable at this time.  Patient CT came back showing colonic diverticulosis most likely causing patient's pain.  Incidental findings including bilateral nephrolithiasis without obstructing stones was also found and relayed to the patient.  Patient also does have signs of mild pulmonary edema however does not endorse any shortness of breath or chest pain  and her vitals have been good throughout ED stay and so patient was not given Lasix.  I spoke to the patient we agreed to follow-up with GI for diverticulosis causing her pain and urology for her  stones.  I encouraged patient to follow-up with her primary care provider in regards to the mild pulmonary edema.  I encouraged patient to use Tylenol every 6 hours as needed for pain and to return to ER symptoms are to change or worsen.  During patient's ED stay she has not required any IV pain meds and is stated multiple times that she usually does not feel that she needs IV pain meds.  Patient was given return precautions. Patient stable for discharge at this time.  Patient verbalized understanding of plan.         Final Clinical Impression(s) / ED Diagnoses Final diagnoses:  Diverticulosis  Nephrolithiasis    Rx / DC Orders ED Discharge Orders     None         Remi Deter 11/21/22 1649    Bethann Berkshire, MD 11/26/22 1036

## 2022-11-24 ENCOUNTER — Ambulatory Visit: Payer: PPO | Admitting: Urology

## 2022-11-24 ENCOUNTER — Encounter: Payer: Self-pay | Admitting: Urology

## 2022-11-24 VITALS — BP 161/68 | HR 43

## 2022-11-24 DIAGNOSIS — N135 Crossing vessel and stricture of ureter without hydronephrosis: Secondary | ICD-10-CM | POA: Diagnosis not present

## 2022-11-24 DIAGNOSIS — N200 Calculus of kidney: Secondary | ICD-10-CM

## 2022-11-24 DIAGNOSIS — J811 Chronic pulmonary edema: Secondary | ICD-10-CM | POA: Insufficient documentation

## 2022-11-24 NOTE — Progress Notes (Signed)
11/24/2022 1:53 PM   [name] November 12, 1935 [id number]  Referring provider: Benita Stabile, MD [address] [place],  [place] [address]  No chief complaint on file.   HPI:  New patient-  1) chronic left UPJ obstruction-patient has seen Dr. Annabell Howells and Dr. Liliane Shi.  She follows with Dr. Liliane Shi routinely in [place].  She saw him December 2023.  A left renal ultrasound was stable with left extrarenal pelvis appearance.  She had a 2020 renal scan which showed left function of 42% with a T half of 18 minutes, and a right function of 58% with a T half of 7.1 minutes.  Patient declined surgical intervention.  Her creatinine typically runs 1.03-1.77 looking back over the last few years.  2) left renal stones-she underwent a left ureteroscopy with Dr. Liliane Shi in 2021 for a 6 mm dependent left renal stone.  Today, seen for the above.  She underwent a CT scan contrast 11/10/2022 for abdominal pain. This did show some delayed washout on the left.  Dilation appears stable.  There were 1-2 dependent 3 to 4 mm renal pelvic stones.  She went back to emergency 11/21/2022 and CT scan again revealed 2 dependent stones in the left renal pelvis.  This looked stable to improved compared to 2021 scan prior to URS. UA was clear.  Creatinine 1.79.  She is holding her stomach and feels like it is swollen. "Flat on the bottom" (the SP area) and upper stomach is "swollen". She is holding her stomach. She has no fever, nausea or emesis. Drinking more water p the ER. She has "constant back pain". No change. No flank pain. She is not constipated. She is not having dysuria or gross hematuria. She gets dizzy when she gets the pain.   She is on eliquis. She has a history of chronic back pain, compression fracture, CVA x 3.  History of pelvic radiation for nonhealing incision/cancerous lesion after hysterectomy.  She was an Radio producer and respiratory tech.    PMH: Past Medical History:  Diagnosis Date    Adenomatous colon polyp 2008   Anxiety    Asthma    has not needed in over a year   Complication of anesthesia    pt reports TIA and sezures after surgery   Coronary artery disease    CVA (cerebral vascular accident) (HCC)    x 3   Degenerative disc disease, lumbar    Depression    Diverticulosis    Dysrhythmia    GERD (gastroesophageal reflux disease)    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75   age 64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 2 weeks   Hemorrhoids 2007   History of kidney stones    HTN (hypertension)    Hyperplastic colon polyp 2005   Hypothyroidism    Narcolepsy    PONV (postoperative nausea and vomiting)    TIA (transient ischemic attack)     Surgical History: Past Surgical History:  Procedure Laterality Date   ABDOMINAL HYSTERECTOMY     age 5, complicated by poor wound healing, followed by revision of scar and radiation for ?malignancy   BACK SURGERY  07/2018   lost 2 inches   BREAST ENHANCEMENT SURGERY     BREAST IMPLANT EXCHANGE Right 06/23/2016   Procedure: RIGHT BREAST IMPLANT REMOVAL AND REPLACEMENT;  Surgeon: Louisa Second, MD;  Location: [facility];  Service: Plastics;  Laterality: Right;   COLONOSCOPY  2005   2 hyperplastic polyps   COLONOSCOPY  2007  Dr. Darrick Penna- hemorrhoids   COLONOSCOPY  2008   Dr. Steva Ready polyp- rare sigmoid diverticulosis, internal hemorrhoids   COLONOSCOPY  12/01/2011   Procedure: COLONOSCOPY;  Surgeon: West Bali, MD;  Location: [facility];  Service: Endoscopy;  Laterality: N/A;  12:30 PM   COLONOSCOPY WITH PROPOFOL N/A 05/22/2022   Procedure: COLONOSCOPY WITH PROPOFOL;  Surgeon: Lanelle Bal, DO;  Location: [facility];  Service: Endoscopy;  Laterality: N/A;  1:00 PM   CYSTOSCOPY/URETEROSCOPY/HOLMIUM LASER/STENT PLACEMENT Left 02/29/2020   Procedure: CYSTOSCOPY/URETEROSCOPY/HOLMIUM LASER/STENT PLACEMENT;  Surgeon: Rene Paci, MD;  Location: [facility];  Service: Urology;  Laterality: Left;   epidural  steroid injection     She is getting injections with Dr. Ethelene Hal q3wks.   ESOPHAGOGASTRODUODENOSCOPY (EGD) WITH ESOPHAGEAL DILATION N/A 04/15/2013   Procedure: ESOPHAGOGASTRODUODENOSCOPY (EGD) WITH ESOPHAGEAL DILATION;  Surgeon: West Bali, MD;  Location: [facility];  Service: Endoscopy;  Laterality: N/A;  11:45-moved to 12:30 [name] to notify pt   EYE SURGERY  2010   EYE SURGERY     IR KYPHO LUMBAR INC FX REDUCE BONE BX UNI/BIL CANNULATION INC/IMAGING  08/23/2018   IR RADIOLOGIST EVAL & MGMT  12/23/2018   POLYPECTOMY  05/22/2022   Procedure: POLYPECTOMY;  Surgeon: Lanelle Bal, DO;  Location: [facility];  Service: Endoscopy;;   right thumb surgery      Home Medications:  Allergies as of 11/24/2022       Reactions   Epinephrine Anaphylaxis   Demerol [meperidine] Other (See Comments)   headaches   Vicodin [hydrocodone-acetaminophen] Other (See Comments)   York Spaniel it makes her crazy. Pt tolerates acetaminophen        Medication List        Accurate as of November 24, 2022  1:53 PM. If you have any questions, ask your nurse or doctor.          albuterol 108 (90 Base) MCG/ACT inhaler Commonly known as: VENTOLIN HFA Inhale 1-2 puffs into the lungs every 6 (six) hours as needed for wheezing or shortness of breath.   albuterol (2.5 MG/3ML) 0.083% nebulizer solution Commonly known as: PROVENTIL Take 3 mLs (2.5 mg total) by nebulization every 6 (six) hours as needed for wheezing or shortness of breath.   Biotin 300 MCG Tabs Take 300 mcg by mouth daily after supper.   CALCIUM 600 + D PO Take 1 tablet by mouth in the morning and at bedtime.   Eliquis 5 MG Tabs tablet Generic drug: apixaban Take 5 mg by mouth 2 (two) times daily.   ezetimibe 10 MG tablet Commonly known as: ZETIA Take 1 tablet (10 mg total) by mouth daily.   famotidine 20 MG tablet Commonly known as: PEPCID Take 20 mg by mouth daily.   Fish Oil 1200 MG Caps Take 1,200 mg by mouth daily.    Flaxseed Oil 1000 MG Caps Take 1 capsule by mouth daily.   flecainide 50 MG tablet Commonly known as: TAMBOCOR TAKE 1 TABLET(50 MG) BY MOUTH TWICE DAILY   furosemide 40 MG tablet Commonly known as: LASIX Take 1 1/2 tabs (60mg ) by mouth alternating every other day with 1 tab (40mg )   Iron 240 (27 Fe) MG Tabs Take 1 tablet by mouth daily.   levothyroxine 75 MCG tablet Commonly known as: SYNTHROID Take 75 mcg by mouth daily before breakfast.   losartan 25 MG tablet Commonly known as: COZAAR Take 25 mg by mouth daily.   Lutein 40 MG Caps Take  40 mg by mouth daily.   Magnesium 250 MG Tabs Take 250 mg by mouth daily.   memantine 5 MG tablet Commonly known as: NAMENDA Take 5 mg by mouth 2 (two) times daily.   metoprolol tartrate 25 MG tablet Commonly known as: LOPRESSOR Take 1 tablet (25 mg total) by mouth 2 (two) times daily. (MAY TAKE AN ADDITIONAL 25MG  AS NEEDED FOR PALPITATIONS)   OVER THE COUNTER MEDICATION daily at 6 (six) AM. Collagen   pantoprazole 40 MG tablet Commonly known as: PROTONIX Take 1 tablet (40 mg total) by mouth daily.   potassium chloride 10 MEQ tablet Commonly known as: KLOR-CON M Take 10 mEq by mouth 2 (two) times daily.   Turmeric 500 MG Tabs Take 500 mg by mouth daily.   Vitamin B Complex Tabs Take 1 tablet by mouth daily.   vitamin C 1000 MG tablet Take 1,000 mg by mouth daily.   Vitamin D (Cholecalciferol) 25 MCG (1000 UT) Tabs Take 1,000 Int'l Units/1.15m2 by mouth daily at 6 (six) AM.   vitamin E 180 MG (400 UNITS) capsule Take 400 Units by mouth daily.   Zinc 50 MG Tabs Take 50 mg by mouth daily.        Allergies:  Allergies  Allergen Reactions   Epinephrine Anaphylaxis   Demerol [Meperidine] Other (See Comments)    headaches   Vicodin [Hydrocodone-Acetaminophen] Other (See Comments)    York Spaniel it makes her crazy. Pt tolerates acetaminophen    Family History: Family History  Problem Relation Age of Onset   Colon  cancer Father        age 60   Prostate cancer Father    Pancreatic cancer Mother        age 69    Social History:  reports that she has never smoked. She has never been exposed to tobacco smoke. She has never used smokeless tobacco. She reports that she does not drink alcohol and does not use drugs.   Physical Exam: BP (!) 161/68   Pulse (!) 43   Constitutional:  Alert and oriented, No acute distress. HEENT: [place] AT, moist mucus membranes.  Trachea midline, no masses. Cardiovascular: No clubbing, cyanosis, or edema. Respiratory: Normal respiratory effort, no increased work of breathing. GI: Abdomen is soft, nontender but tender in the RUQ, nondistended, no abdominal masses GU: No CVA tenderness; she has right low back and hip discomfort  Skin: No rashes, bruises or suspicious lesions. Neurologic: Grossly intact, no focal deficits, moving all 4 extremities. Psychiatric: Normal mood and affect.  Laboratory Data: Lab Results  Component Value Date   WBC 7.6 11/21/2022   HGB 13.9 11/21/2022   HCT 43.9 11/21/2022   MCV 91.3 11/21/2022   PLT 216 11/21/2022    Lab Results  Component Value Date   CREATININE 1.79 (H) 11/21/2022    No results found for: "PSA"  No results found for: "TESTOSTERONE"  Lab Results  Component Value Date   HGBA1C  01/04/2009    5.6 (NOTE) The ADA recommends the following therapeutic goal for glycemic control related to Hgb A1c measurement: Goal of therapy: <6.5 Hgb A1c  Reference: American Diabetes Association: Clinical Practice Recommendations 2010, Diabetes Care, 2010, 33: (Suppl  1).    Urinalysis    Component Value Date/Time   COLORURINE YELLOW 11/21/2022 1610   APPEARANCEUR CLEAR 11/21/2022 1610   LABSPEC 1.006 11/21/2022 1610   PHURINE 8.0 11/21/2022 1610   GLUCOSEU NEGATIVE 11/21/2022 1610   HGBUR NEGATIVE 11/21/2022 1610  BILIRUBINUR NEGATIVE 11/21/2022 1610   BILIRUBINUR neg 12/09/2019 0925   KETONESUR NEGATIVE 11/21/2022 1610    PROTEINUR NEGATIVE 11/21/2022 1610   UROBILINOGEN negative (A) 12/09/2019 0925   NITRITE NEGATIVE 11/21/2022 1610   LEUKOCYTESUR NEGATIVE 11/21/2022 1610    Lab Results  Component Value Date   BACTERIA RARE (A) 11/11/2019    Pertinent Imaging: Reviewed CT scans from 2024 x 2.  Ultrasound from 2023, CT from 2023, ultrasound from 2022. CT 2021. Images independently reviewed. No results found for this or any previous visit.   Results for orders placed during the hospital encounter of 12/06/19  CT RENAL STONE STUDY  Narrative CLINICAL DATA:  Hematuria, history of kidney stones,, history of left partial UPJ obstruction, back pain worse on the right  EXAM: CT ABDOMEN AND PELVIS WITHOUT CONTRAST  TECHNIQUE: Multidetector CT imaging of the abdomen and pelvis was performed following the standard protocol without IV contrast.  COMPARISON:  12/21/2018  FINDINGS: Lower chest: No acute abnormality. Cardiomegaly and coronary artery calcifications.  Hepatobiliary: Multiple low-attenuation liver lesions, unchanged compared to prior examination, incompletely characterized although likely cysts or hemangiomata. No gallstones, gallbladder wall thickening, or biliary dilatation.  Pancreas: Unremarkable. No pancreatic ductal dilatation or surrounding inflammatory changes.  Spleen: Normal in size without significant abnormality.  Adrenals/Urinary Tract: Adrenal glands are unremarkable. Exophytic hemorrhagic cyst of the inferior pole of the right kidney. Tiny nonobstructive right renal calculi. Redemonstrated moderate left hydronephrosis with an enlarged renal pelvis and abrupt caliber change of the left ureter at the ureteropelvic junction. A previously seen 6 mm calyceal calculus is now located dependently in the left renal pelvis (series 2, image 28). Bladder is unremarkable.  Stomach/Bowel: Stomach is within normal limits. Appendix is not clearly visualized and may be surgically  absent. No evidence of bowel wall thickening, distention, or inflammatory changes. Sigmoid diverticulosis.  Vascular/Lymphatic: Aortic atherosclerosis. No enlarged abdominal or pelvic lymph nodes.  Reproductive: Status post hysterectomy  Other: No abdominal wall hernia or abnormality. No abdominopelvic ascites.  Musculoskeletal: No acute or significant osseous findings.  IMPRESSION: 1. Redemonstrated moderate left hydronephrosis with an enlarged renal pelvis and abrupt caliber change of the left ureter at the ureteropelvic junction, in keeping with stricture. 2. A previously seen 6 mm calyceal calculus is now located dependently in the left renal pelvis. 3. Tiny nonobstructive right renal calculi. 4. Aortic Atherosclerosis (ICD10-I70.0).   Electronically Signed By: Lauralyn Primes M.D. On: 12/06/2019 17:02   Assessment & Plan:    Left upj obs - her imaging and exam are not c/w flank or renal pain. Discussed cysto, RGP and left stent. Also left URS. She does not want surgery.   Left renal stones - as above.   No follow-ups on file.  Jerilee Field, MD  [facility]  [address] [place], [place] [address] [phone]

## 2022-12-03 DIAGNOSIS — E039 Hypothyroidism, unspecified: Secondary | ICD-10-CM | POA: Diagnosis not present

## 2022-12-03 DIAGNOSIS — E782 Mixed hyperlipidemia: Secondary | ICD-10-CM | POA: Diagnosis not present

## 2022-12-08 DIAGNOSIS — N1831 Chronic kidney disease, stage 3a: Secondary | ICD-10-CM | POA: Diagnosis not present

## 2022-12-08 DIAGNOSIS — R109 Unspecified abdominal pain: Secondary | ICD-10-CM | POA: Diagnosis not present

## 2022-12-08 DIAGNOSIS — I5032 Chronic diastolic (congestive) heart failure: Secondary | ICD-10-CM | POA: Diagnosis not present

## 2022-12-08 DIAGNOSIS — M25551 Pain in right hip: Secondary | ICD-10-CM | POA: Diagnosis not present

## 2022-12-08 DIAGNOSIS — M546 Pain in thoracic spine: Secondary | ICD-10-CM | POA: Diagnosis not present

## 2022-12-08 DIAGNOSIS — M545 Low back pain, unspecified: Secondary | ICD-10-CM | POA: Diagnosis not present

## 2022-12-08 DIAGNOSIS — G8929 Other chronic pain: Secondary | ICD-10-CM | POA: Diagnosis not present

## 2022-12-08 DIAGNOSIS — M25552 Pain in left hip: Secondary | ICD-10-CM | POA: Diagnosis not present

## 2022-12-08 DIAGNOSIS — I130 Hypertensive heart and chronic kidney disease with heart failure and stage 1 through stage 4 chronic kidney disease, or unspecified chronic kidney disease: Secondary | ICD-10-CM | POA: Diagnosis not present

## 2022-12-08 DIAGNOSIS — E782 Mixed hyperlipidemia: Secondary | ICD-10-CM | POA: Diagnosis not present

## 2022-12-08 DIAGNOSIS — R600 Localized edema: Secondary | ICD-10-CM | POA: Diagnosis not present

## 2022-12-09 ENCOUNTER — Encounter: Payer: Self-pay | Admitting: Internal Medicine

## 2022-12-09 ENCOUNTER — Encounter: Payer: Self-pay | Admitting: Neurology

## 2022-12-09 ENCOUNTER — Ambulatory Visit: Payer: PPO | Admitting: Neurology

## 2022-12-09 VITALS — BP 183/86 | HR 59 | Ht 60.0 in | Wt 132.7 lb

## 2022-12-09 DIAGNOSIS — Z8673 Personal history of transient ischemic attack (TIA), and cerebral infarction without residual deficits: Secondary | ICD-10-CM

## 2022-12-09 DIAGNOSIS — R413 Other amnesia: Secondary | ICD-10-CM | POA: Diagnosis not present

## 2022-12-09 DIAGNOSIS — Z87898 Personal history of other specified conditions: Secondary | ICD-10-CM | POA: Diagnosis not present

## 2022-12-09 DIAGNOSIS — F339 Major depressive disorder, recurrent, unspecified: Secondary | ICD-10-CM

## 2022-12-09 MED ORDER — MEMANTINE HCL 10 MG PO TABS
10.0000 mg | ORAL_TABLET | Freq: Two times a day (BID) | ORAL | 3 refills | Status: DC
Start: 2022-12-09 — End: 2023-03-02

## 2022-12-09 NOTE — Patient Instructions (Addendum)
Continue Namenda, but will change to 10 mg tablet to make administration easier, 1 tablet twice daily  Follow up with Dr. Margo Aye about your depression, mood  Close monitor of driving and independent living, would recommend planning for the future   Could benefit from social work consult, discuss having advanced directives, POA  Follow up in 1 year

## 2022-12-09 NOTE — Progress Notes (Signed)
Patient: [name] Date of Birth: [birth year]  Reason for Visit: Follow up History from: Patient, friend Selena Batten  Primary Neurologist: Teresa Coombs  ASSESSMENT AND PLAN 87 y.o. year old female   1.  History of seizures, not on medication 2.  History of cerebrovascular disease 3.  Memory disturbance 4.  Depression   -MMSE 25/30 (29/24 May 2022) -Continue Namenda, will switch to 10 mg tablet twice daily for easier medication administration instead of the 5 mg  -Recommend discussing mood management with primary care  -We have held Aricept due to seizure history -Recommend increased supervision of independent living and driving. Planning for the future. Consider advanced directives, POA, has discussed with social work  -No recent seizures  -Follow up in 1 year or sooner if needed   HISTORY OF PRESENT ILLNESS: Today 12/09/22 Reviewed labs from PCP 12/03/2022 LDL 139, creatinine 1.41, sodium 146, platelet 247, Hgb 12.9, TSH 6.010. Today, MMSE 25/30. Here with her friend, Selena Batten. With memory, "stays confused". Keeps calendars. Lives alone. Drives locally. Manages her household, finances. She has friends from church who helps her get places. She does misplace things. Kim reports house is tidy. Has several medications to keep track of. Remains on Namenda 10 mg twice daily. She cries talking about her mood, depression. Has not wanted to see psychiatry. No recent seizures. Her son lives on [address], doesn't have contact with him.    05/15/22 SS: MMSE 29/30 today.  Remains on Namenda 10 mg twice a day. Friend brought her, in exam room alone. Doesn't drive to [place]. Feels memory is slipping. Lives alone, does her own ADLs, manages her finances, cooks, does her own shopping. Chronic back pain is an issue. Uses cane. 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 Still periodic depression. Going through rough time, cries easy. Not on anything for her depression. Has  close follow-up with PCP. Poor short term memory.  HISTORY  Dr. Teresa Coombs 11/05/21: Patient presents today for follow-up, she is accompanied by her friend.  Last visit was in December, at that time she was complaining of memory deficit but her MMSE was 27 out of 30.  She was continued on Namenda 10 mg twice daily.  Today she is reporting memory decline but also states that her mood is very low, she saw her PMD yesterday and was started on bupropion yesterday.  She has been on this medication in the past for depression.  She still complains of confusion, memory decline but again this may be related to her depression.  She still going to PT once a week but does not do a lot of exercise.  She still lives alone, independent, still drives, denies being lost in family or places and no recent accident.   REVIEW OF SYSTEMS: Out of a complete 14 system review of symptoms, the patient complains only of the following symptoms, and all other reviewed systems are negative.  See HPI  ALLERGIES: Allergies  Allergen Reactions   Epinephrine Anaphylaxis   Demerol [Meperidine] Other (See Comments)    headaches   Vicodin [Hydrocodone-Acetaminophen] Other (See Comments)    York Spaniel it makes her crazy. Pt tolerates acetaminophen    HOME MEDICATIONS: Outpatient Medications Prior to Visit  Medication Sig Dispense Refill   albuterol (PROVENTIL) (2.5 MG/3ML) 0.083% nebulizer solution Take 3 mLs (2.5 mg total) by nebulization every 6 (six) hours as needed for wheezing or shortness of breath. 75 mL 0   albuterol (VENTOLIN HFA) 108 (  90 Base) MCG/ACT inhaler Inhale 1-2 puffs into the lungs every 6 (six) hours as needed for wheezing or shortness of breath.     Ascorbic Acid (VITAMIN C) 1000 MG tablet Take 1,000 mg by mouth daily.     B Complex Vitamins (VITAMIN B COMPLEX) TABS Take 1 tablet by mouth daily.     Biotin 300 MCG TABS Take 300 mcg by mouth daily after supper.      Calcium Carb-Cholecalciferol (CALCIUM 600 + D PO)  Take 1 tablet by mouth in the morning and at bedtime.     ELIQUIS 5 MG TABS tablet Take 5 mg by mouth 2 (two) times daily.     ezetimibe (ZETIA) 10 MG tablet Take 1 tablet (10 mg total) by mouth daily. 30 tablet 0   famotidine (PEPCID) 20 MG tablet Take 20 mg by mouth daily.     Ferrous Gluconate (IRON) 240 (27 Fe) MG TABS Take 1 tablet by mouth daily. 30 tablet 0   Flaxseed, Linseed, (FLAXSEED OIL) 1000 MG CAPS Take 1 capsule by mouth daily.     flecainide (TAMBOCOR) 50 MG tablet TAKE 1 TABLET(50 MG) BY MOUTH TWICE DAILY 180 tablet 3   furosemide (LASIX) 40 MG tablet Take 1 1/2 tabs (60mg ) by mouth alternating every other day with 1 tab (40mg ) (Patient taking differently: Take 40 mg by mouth 2 (two) times daily. Take 1 1/2 tabs (60mg ) by mouth alternating every other day with 1 tab (40mg )) 115 tablet 3   levothyroxine (SYNTHROID) 75 MCG tablet Take 75 mcg by mouth daily before breakfast.     losartan (COZAAR) 25 MG tablet Take 25 mg by mouth daily.     Lutein 40 MG CAPS Take 40 mg by mouth daily.      Magnesium 250 MG TABS Take 250 mg by mouth daily.      metoprolol tartrate (LOPRESSOR) 25 MG tablet Take 1 tablet (25 mg total) by mouth 2 (two) times daily. (MAY TAKE AN ADDITIONAL 25MG  AS NEEDED FOR PALPITATIONS) 90 tablet 6   Omega-3 Fatty Acids (FISH OIL) 1200 MG CAPS Take 1,200 mg by mouth daily.     OVER THE COUNTER MEDICATION daily at 6 (six) AM. Collagen     pantoprazole (PROTONIX) 40 MG tablet Take 1 tablet (40 mg total) by mouth daily.     potassium chloride (KLOR-CON M) 10 MEQ tablet Take 10 mEq by mouth 2 (two) times daily.     Turmeric 500 MG TABS Take 500 mg by mouth daily.      Vitamin D, Cholecalciferol, 25 MCG (1000 UT) TABS Take 1,000 Int'l Units/1.95m2 by mouth daily at 6 (six) AM.     vitamin E 180 MG (400 UNITS) capsule Take 400 Units by mouth daily.     Zinc 50 MG TABS Take 50 mg by mouth daily.      memantine (NAMENDA) 5 MG tablet Take 5 mg by mouth 2 (two) times daily.      No facility-administered medications prior to visit.    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   Hemorrhoids 2007   History of kidney stones    HTN (hypertension)    Hyperplastic colon polyp 2005   Hypothyroidism    Narcolepsy    PONV (postoperative nausea and vomiting)    TIA (transient ischemic attack)     PAST SURGICAL HISTORY: Past Surgical History:  Procedure Laterality Date   ABDOMINAL HYSTERECTOMY     age 14, complicated by poor wound healing, followed by revision of scar and radiation for ?malignancy   BACK SURGERY  07/2018   lost 2 inches   BREAST ENHANCEMENT SURGERY     BREAST IMPLANT EXCHANGE Right 06/23/2016   Procedure: RIGHT BREAST IMPLANT REMOVAL AND REPLACEMENT;  Surgeon: Louisa Second, MD;  Location: [facility];  Service: Plastics;  Laterality: Right;   COLONOSCOPY  2005   2 hyperplastic polyps   COLONOSCOPY  2007   Dr. Darrick Penna- hemorrhoids   COLONOSCOPY  2008   Dr. Steva Ready polyp- rare sigmoid diverticulosis, internal hemorrhoids   COLONOSCOPY  12/01/2011   Procedure: COLONOSCOPY;  Surgeon: West Bali, MD;  Location: [facility];  Service: Endoscopy;  Laterality: N/A;  12:30 PM   COLONOSCOPY WITH PROPOFOL N/A 05/22/2022   Procedure: COLONOSCOPY WITH PROPOFOL;  Surgeon: Lanelle Bal, DO;  Location: [facility];  Service: Endoscopy;  Laterality: N/A;  1:00 PM   CYSTOSCOPY/URETEROSCOPY/HOLMIUM LASER/STENT PLACEMENT Left 02/29/2020   Procedure: CYSTOSCOPY/URETEROSCOPY/HOLMIUM  LASER/STENT PLACEMENT;  Surgeon: Rene Paci, MD;  Location: [facility];  Service: Urology;  Laterality: Left;   epidural steroid injection     She is getting injections with Dr. Ethelene Hal q3wks.   ESOPHAGOGASTRODUODENOSCOPY (EGD) WITH ESOPHAGEAL DILATION N/A 04/15/2013   Procedure: ESOPHAGOGASTRODUODENOSCOPY (EGD) WITH ESOPHAGEAL DILATION;  Surgeon: West Bali, MD;  Location: [facility];  Service: Endoscopy;  Laterality: N/A;  11:45-moved to 12:30 [name] to notify pt   EYE SURGERY  2010   EYE SURGERY     IR KYPHO LUMBAR INC FX REDUCE BONE BX UNI/BIL CANNULATION INC/IMAGING  08/23/2018   IR RADIOLOGIST EVAL & MGMT  12/23/2018   POLYPECTOMY  05/22/2022   Procedure: POLYPECTOMY;  Surgeon: Lanelle Bal, DO;  Location: [facility];  Service: Endoscopy;;   right thumb surgery      FAMILY HISTORY: Family History  Problem Relation Age of Onset   Colon cancer Father        age 95   Prostate cancer Father    Pancreatic cancer Mother        age 76    SOCIAL HISTORY: Social History   Socioeconomic History   Marital status: Widowed    Spouse name: Not on file   Number of children: 1   Years of education: 40   Highest education level: 12th grade  Occupational History   Not on file  Tobacco Use   Smoking status: Never    Passive exposure: Never   Smokeless tobacco: Never  Vaping Use   Vaping status: Never Used  Substance and Sexual Activity   Alcohol use: No   Drug use: No   Sexual activity: Not Currently    Partners: Male  Other Topics Concern   Not on file  Social History Narrative   Lives alone   Right Handed    Drinks no caffeine daily   Social Determinants of Health  Financial Resource Strain: Low Risk  (02/06/2022)   Overall Financial Resource Strain (CARDIA)    Difficulty of Paying Living Expenses: Not hard at all  Food Insecurity: No Food Insecurity (02/06/2022)   Hunger Vital Sign    Worried About Running Out of Food in the Last Year: Never  true    Ran Out of Food in the Last Year: Never true  Transportation Needs: No Transportation Needs (02/06/2022)   PRAPARE - Administrator, Civil Service (Medical): No    Lack of Transportation (Non-Medical): No  Physical Activity: Inactive (02/06/2022)   Exercise Vital Sign    Days of Exercise per Week: 0 days    Minutes of Exercise per Session: 0 min  Stress: No Stress Concern Present (02/06/2022)   [employer] of Occupational Health - Occupational Stress Questionnaire    Feeling of Stress : Only a little  Social Connections: Moderately Integrated (02/06/2022)   Social Connection and Isolation Panel [NHANES]    Frequency of Communication with Friends and Family: More than three times a week    Frequency of Social Gatherings with Friends and Family: More than three times a week    Attends Religious Services: More than 4 times per year    Active Member of [employer] or Organizations: Yes    Attends Banker Meetings: More than 4 times per year    Marital Status: Widowed  Intimate Partner Violence: Not At Risk (02/06/2022)   Humiliation, Afraid, Rape, and Kick questionnaire    Fear of Current or Ex-Partner: No    Emotionally Abused: No    Physically Abused: No    Sexually Abused: No    PHYSICAL EXAM  Vitals:   12/09/22 1038 12/09/22 1053  BP: (!) 169/76 (!) 183/86  Pulse: (!) 59   Weight: 132 lb 11.5 oz (60.2 kg)   Height: 5' (1.524 m)     Body mass index is 25.92 kg/m.    12/09/2022   10:48 AM 05/15/2022    9:32 AM 11/05/2021   10:25 AM  MMSE - Mini Mental State Exam  Orientation to time 4 5 4   Orientation to Place 4 5 5   Registration 3 3 3   Attention/ Calculation 5 4 5   Recall 1 3 2   Language- name 2 objects 2 2 2   Language- repeat 1 1 1   Language- follow 3 step command 3 3 3   Language- read & follow direction 1 1 1   Write a sentence 1 1 1   Copy design 0 1 0  Total score 25 29 27     Generalized: Well developed, in no acute distress,  depressed elderly female, tearful Neurological examination  Mentation: Alert oriented to time, place, history taking. Follows all commands. Cranial nerve II-XII: Pupils were equal round reactive to light. Extraocular movements were full, visual field were full on confrontational test. Facial sensation and strength were normal.  Head turning and shoulder shrug were normal and symmetric. Motor: good strength bilaterally  Sensory: Sensory testing is intact to soft touch on all 4 extremities. No evidence of extinction is noted.  Coordination: Cerebellar testing reveals good finger-nose-finger and heel-to-shin bilaterally.  Gait and station: Gait is wide-based, cautious Reflexes: Deep tendon reflexes are symmetric and normal bilaterally.   DIAGNOSTIC DATA (LABS, IMAGING, TESTING) - I reviewed patient records, labs, notes, testing and imaging myself where available.  Lab Results  Component Value Date   WBC 7.6 11/21/2022   HGB 13.9 11/21/2022   HCT 43.9 11/21/2022  MCV 91.3 11/21/2022   PLT 216 11/21/2022      Component Value Date/Time   NA 143 11/21/2022 1328   K 4.3 11/21/2022 1328   CL 99 11/21/2022 1328   CO2 34 (H) 11/21/2022 1328   GLUCOSE 105 (H) 11/21/2022 1328   BUN 38 (H) 11/21/2022 1328   CREATININE 1.79 (H) 11/21/2022 1328   CREATININE 1.00 (H) 01/11/2015 0954   CALCIUM 10.8 (H) 11/21/2022 1328   PROT 6.6 11/21/2022 1328   ALBUMIN 3.9 11/21/2022 1328   AST 22 11/21/2022 1328   ALT 18 11/21/2022 1328   ALKPHOS 59 11/21/2022 1328   BILITOT 0.7 11/21/2022 1328   GFRNONAA 27 (L) 11/21/2022 1328   GFRAA 52 (L) 02/27/2020 1107   Lab Results  Component Value Date   CHOL 220 (H) 12/17/2018   HDL 60 12/17/2018   LDLCALC 131 (H) 12/17/2018   TRIG 146 12/17/2018   CHOLHDL 3.7 12/17/2018   Lab Results  Component Value Date   HGBA1C  01/04/2009    5.6 (NOTE) The ADA recommends the following therapeutic goal for glycemic control related to Hgb A1c measurement: Goal of  therapy: <6.5 Hgb A1c  Reference: American Diabetes Association: Clinical Practice Recommendations 2010, Diabetes Care, 2010, 33: (Suppl  1).   Lab Results  Component Value Date   VITAMINB12 1,895 (H) 10/16/2020   Lab Results  Component Value Date   TSH 3.716 01/29/2021    Margie Ege, AGNP-C, DNP 12/09/2022, 12:21 PM [facility] [address], [address] [place], [place] [address] [phone]

## 2022-12-18 ENCOUNTER — Ambulatory Visit: Payer: PPO | Admitting: Gastroenterology

## 2022-12-18 ENCOUNTER — Encounter: Payer: Self-pay | Admitting: Gastroenterology

## 2022-12-18 VITALS — BP 193/75 | HR 53 | Temp 97.9°F | Ht 60.0 in | Wt 132.8 lb

## 2022-12-18 DIAGNOSIS — R140 Abdominal distension (gaseous): Secondary | ICD-10-CM

## 2022-12-18 DIAGNOSIS — K219 Gastro-esophageal reflux disease without esophagitis: Secondary | ICD-10-CM | POA: Diagnosis not present

## 2022-12-18 NOTE — Patient Instructions (Signed)
Please have blood work done. We will contact you with the results!  We will see you in 3 months.  I recommend going back to Dr. Margo Aye to talk about different medications to help stabilize mood!  I enjoyed seeing you again today! I value our relationship and want to provide genuine, compassionate, and quality care. You may receive a survey regarding your visit with me, and I welcome your feedback! Thanks so much for taking the time to complete this. I look forward to seeing you again.      Gelene Mink, PhD, ANP-BC [facility]

## 2022-12-18 NOTE — Progress Notes (Signed)
Gastroenterology Office Note     Primary Care Physician:  Benita Stabile, MD  Primary Gastroenterologist: Dr. Marletta Lor   Chief Complaint   Chief Complaint  Patient presents with   Follow-up    Follow up after CT     History of Present Illness   [name] is an 87 y.o. female presenting today with a history of chronic GERD, polyps, recent colonoscopy Dec 2023 with serrated adenomas. No surveillance due to age.   At last visit in June, she noted bloating in upper abdomen, decreaed appetite. 10 lbs over the past year and 5 lbs since seen in March 2024. Typical weight in the past has been in low 140s. Now 132. However, this is stable from last visit.   CT with pancreatic protocol 6/22 with cholelithiasis, otherwise no significant or acute findings.   Still notes early satiety. States upper abdomen is bulged but has been this way chronically; she is quite concerned about this. Early satiety has been going for a few months, along with decreased appetite. GERD controlled. No dysphagia. Pantoprazole daily. Wants to avoid invasive testing.   Constipation resolved. Has BM 3-4 times a day, no diarrhea. Feels productive.   Depression chronically. Worsened over past few weeks. Son lives in [place]. No suicidal/homicidal ideation. Saw Neurology July 2024 who recommended discussing mood management with primary care.   Memory shorter every day. Thinks may have had more TIAs. Declining psychiatry. Fixated on upper abdomen "pooch" but states this is chronic. No postprandial abdominal pain.    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 2 weeks   Hemorrhoids 2007   History of kidney stones    HTN (hypertension)    Hyperplastic colon polyp 2005   Hypothyroidism    Narcolepsy    PONV (postoperative nausea and vomiting)    TIA (transient ischemic attack)     Past Surgical History:  Procedure Laterality Date   ABDOMINAL HYSTERECTOMY     age 34, complicated by poor wound healing, followed by revision of scar and radiation for ?malignancy   BACK SURGERY  07/2018   lost 2 inches   BREAST ENHANCEMENT SURGERY     BREAST IMPLANT EXCHANGE Right 06/23/2016   Procedure: RIGHT BREAST IMPLANT REMOVAL AND REPLACEMENT;  Surgeon: Louisa Second, MD;  Location: [facility];  Service: Plastics;  Laterality: Right;   COLONOSCOPY  2005   2 hyperplastic polyps   COLONOSCOPY  2007   Dr. Darrick Penna- hemorrhoids   COLONOSCOPY  2008   Dr. Steva Ready polyp- rare sigmoid diverticulosis, internal hemorrhoids   COLONOSCOPY  12/01/2011   Procedure: COLONOSCOPY;  Surgeon: West Bali, MD;  Location: [facility];  Service: Endoscopy;  Laterality: N/A;  12:30 PM   COLONOSCOPY WITH PROPOFOL N/A 05/22/2022  Procedure: COLONOSCOPY WITH PROPOFOL;  Surgeon: Lanelle Bal, DO;  Location: [facility];  Service: Endoscopy;  Laterality: N/A;  1:00 PM   CYSTOSCOPY/URETEROSCOPY/HOLMIUM LASER/STENT PLACEMENT Left 02/29/2020   Procedure: CYSTOSCOPY/URETEROSCOPY/HOLMIUM LASER/STENT PLACEMENT;  Surgeon: Rene Paci, MD;  Location: [facility];  Service: Urology;  Laterality: Left;   epidural steroid injection     She is getting injections with Dr. Ethelene Hal q3wks.   ESOPHAGOGASTRODUODENOSCOPY (EGD) WITH ESOPHAGEAL DILATION N/A 04/15/2013   Procedure: ESOPHAGOGASTRODUODENOSCOPY (EGD) WITH ESOPHAGEAL DILATION;  Surgeon: West Bali,  MD;  Location: [facility];  Service: Endoscopy;  Laterality: N/A;  11:45-moved to 12:30 [name] to notify pt   EYE SURGERY  2010   EYE SURGERY     IR KYPHO LUMBAR INC FX REDUCE BONE BX UNI/BIL CANNULATION INC/IMAGING  08/23/2018   IR RADIOLOGIST EVAL & MGMT  12/23/2018   POLYPECTOMY  05/22/2022   Procedure: POLYPECTOMY;  Surgeon: Lanelle Bal, DO;  Location: [facility];  Service: Endoscopy;;   right thumb surgery      Current Outpatient Medications  Medication Sig Dispense Refill   albuterol (VENTOLIN HFA) 108 (90 Base) MCG/ACT inhaler Inhale 1-2 puffs into the lungs every 6 (six) hours as needed for wheezing or shortness of breath.     Ascorbic Acid (VITAMIN C) 1000 MG tablet Take 1,000 mg by mouth daily.     B Complex Vitamins (VITAMIN B COMPLEX) TABS Take 1 tablet by mouth daily.     Biotin 300 MCG TABS Take 300 mcg by mouth daily after supper.      Calcium Carb-Cholecalciferol (CALCIUM 600 + D PO) Take 1 tablet by mouth in the morning and at bedtime.     ELIQUIS 5 MG TABS tablet Take 5 mg by mouth 2 (two) times daily.     ezetimibe (ZETIA) 10 MG tablet Take 1 tablet (10 mg total) by mouth daily. 30 tablet 0   Ferrous Gluconate (IRON) 240 (27 Fe) MG TABS Take 1 tablet by mouth daily. 30 tablet 0   Flaxseed, Linseed, (FLAXSEED OIL) 1000 MG CAPS Take 1 capsule by mouth daily.     flecainide (TAMBOCOR) 50 MG tablet TAKE 1 TABLET(50 MG) BY MOUTH TWICE DAILY 180 tablet 3   furosemide (LASIX) 40 MG tablet Take 1 1/2 tabs (60mg ) by mouth alternating every other day with 1 tab (40mg ) (Patient taking differently: Take 40 mg by mouth 2 (two) times daily. Take 1 1/2 tabs (60mg ) by mouth alternating every other day with 1 tab (40mg )) 115 tablet 3   levothyroxine (SYNTHROID) 75 MCG tablet Take 75 mcg by mouth daily before breakfast.     losartan (COZAAR) 25 MG tablet Take 25 mg by mouth daily.     Lutein 40 MG CAPS Take 40 mg by mouth daily.      Magnesium 250 MG TABS Take 250 mg by mouth  daily.      memantine (NAMENDA) 10 MG tablet Take 1 tablet (10 mg total) by mouth 2 (two) times daily. 180 tablet 3   metoprolol tartrate (LOPRESSOR) 25 MG tablet Take 1 tablet (25 mg total) by mouth 2 (two) times daily. (MAY TAKE AN ADDITIONAL 25MG  AS NEEDED FOR PALPITATIONS) 90 tablet 6   Omega-3 Fatty Acids (FISH OIL) 1200 MG CAPS Take 1,200 mg by mouth daily.     OVER THE COUNTER MEDICATION daily at 6 (six) AM. Collagen     pantoprazole (PROTONIX) 40 MG tablet Take 1 tablet (40 mg total) by  mouth daily.     potassium chloride (KLOR-CON M) 10 MEQ tablet Take 10 mEq by mouth 2 (two) times daily.     Turmeric 500 MG TABS Take 500 mg by mouth daily.      Vitamin D, Cholecalciferol, 25 MCG (1000 UT) TABS Take 1,000 Int'l Units/1.78m2 by mouth daily at 6 (six) AM.     vitamin E 180 MG (400 UNITS) capsule Take 400 Units by mouth daily.     Zinc 50 MG TABS Take 50 mg by mouth daily.      albuterol (PROVENTIL) (2.5 MG/3ML) 0.083% nebulizer solution Take 3 mLs (2.5 mg total) by nebulization every 6 (six) hours as needed for wheezing or shortness of breath. (Patient not taking: Reported on 12/18/2022) 75 mL 0   famotidine (PEPCID) 20 MG tablet Take 20 mg by mouth daily. (Patient not taking: Reported on 12/18/2022)     No current facility-administered medications for this visit.    Allergies as of 12/18/2022 - Review Complete 12/18/2022  Allergen Reaction Noted   Epinephrine Anaphylaxis 11/18/2011   Demerol [meperidine] Other (See Comments) 11/18/2011   Vicodin [hydrocodone-acetaminophen] Other (See Comments) 11/18/2011    Family History  Problem Relation Age of Onset   Colon cancer Father        age 75   Prostate cancer Father    Pancreatic cancer Mother        age 27    Social History   Socioeconomic History   Marital status: Widowed    Spouse name: Not on file   Number of children: 1   Years of education: 43   Highest education level: 12th grade  Occupational History   Not on file   Tobacco Use   Smoking status: Never    Passive exposure: Never   Smokeless tobacco: Never  Vaping Use   Vaping status: Never Used  Substance and Sexual Activity   Alcohol use: No   Drug use: No   Sexual activity: Not Currently    Partners: Male  Other Topics Concern   Not on file  Social History Narrative   Lives alone   Right Handed    Drinks no caffeine daily   Social Determinants of Health   Financial Resource Strain: Low Risk  (02/06/2022)   Overall Financial Resource Strain (CARDIA)    Difficulty of Paying Living Expenses: Not hard at all  Food Insecurity: No Food Insecurity (02/06/2022)   Hunger Vital Sign    Worried About Running Out of Food in the Last Year: Never true    Ran Out of Food in the Last Year: Never true  Transportation Needs: No Transportation Needs (02/06/2022)   PRAPARE - Administrator, Civil Service (Medical): No    Lack of Transportation (Non-Medical): No  Physical Activity: Inactive (02/06/2022)   Exercise Vital Sign    Days of Exercise per Week: 0 days    Minutes of Exercise per Session: 0 min  Stress: No Stress Concern Present (02/06/2022)   [employer] of Occupational Health - Occupational Stress Questionnaire    Feeling of Stress : Only a little  Social Connections: Moderately Integrated (02/06/2022)   Social Connection and Isolation Panel [NHANES]    Frequency of Communication with Friends and Family: More than three times a week    Frequency of Social Gatherings with Friends and Family: More than three times a week    Attends Religious Services: More than 4 times per year    Active Member of  Clubs or Organizations: Yes    Attends Banker Meetings: More than 4 times per year    Marital Status: Widowed  Intimate Partner Violence: Not At Risk (02/06/2022)   Humiliation, Afraid, Rape, and Kick questionnaire    Fear of Current or Ex-Partner: No    Emotionally Abused: No    Physically Abused: No    Sexually Abused:  No     Review of Systems   Gen: see HPI CV: Denies chest pain, heart palpitations, peripheral edema, syncope.  Resp: Denies shortness of breath at rest or with exertion. Denies wheezing or cough.  GI: see HPI GU : Denies urinary burning, urinary frequency, urinary hesitancy MS: Denies joint pain, muscle weakness, cramps, or limitation of movement.  Derm: Denies rash, itching, dry skin Psych: Denies depression, anxiety, memory loss, and confusion Heme: Denies bruising, bleeding, and enlarged lymph nodes.   Physical Exam   BP (!) 193/75   Pulse (!) 53   Temp 97.9 F (36.6 C)   Ht 5' (1.524 m)   Wt 132 lb 12.8 oz (60.2 kg)   BMI 25.94 kg/m  General:   Alert and oriented. Pleasant and cooperative. Frail, flat affect Head:  Normocephalic and atraumatic. Eyes:  Without icterus Abdomen:  +BS, soft, non-tender and non-distended. Adipose distribution predominantly upper abdomen Rectal:  Deferred  Msk:  kyphosis  Extremities:  Without edema. Neurologic:  Alert and  oriented x4   Lab Results  Component Value Date   ALT 18 11/21/2022   AST 22 11/21/2022   ALKPHOS 59 11/21/2022   BILITOT 0.7 11/21/2022    Lab Results  Component Value Date   NA 143 11/21/2022   CL 99 11/21/2022   K 4.3 11/21/2022   CO2 34 (H) 11/21/2022   BUN 38 (H) 11/21/2022   CREATININE 1.79 (H) 11/21/2022   GFRNONAA 27 (L) 11/21/2022   CALCIUM 10.8 (H) 11/21/2022   ALBUMIN 3.9 11/21/2022   GLUCOSE 105 (H) 11/21/2022   Lab Results  Component Value Date   WBC 7.6 11/21/2022   HGB 13.9 11/21/2022   HCT 43.9 11/21/2022   MCV 91.3 11/21/2022   PLT 216 11/21/2022    Assessment    88 y.o. female presenting today with a history of chronic GERD, polyps, recent colonoscopy Dec 2023 with serrated adenomas, prior weight loss now stabilized, following up for dyspepsia.  She is fixated on bloating of upper abdomen, which appears to be predominantly related to adipose distribution compounded by back  curvature with kyphosis. Weight is now stabilized; she has had decreased appetite and early satiety. CT with/without contrast unrevealing. Labs overall unrevealing. GERD controlled on PPI daily. She is declining EGD.  I suspect majority of her decreased appetite and appetite changes are multifactorial in light of aging, memory changes, depression. She would benefit greatly from geropsych; however, she is declining this.   We will reach out to her PCP to have her seen sooner for discussion of medications that may assist with depression.   PLAN    Check celiac labs for completeness' sake Refer back to PCP Recommend GeroPsych if patient willing in future Return 3 months   Gelene Mink, PhD, [facility]

## 2022-12-29 ENCOUNTER — Telehealth (HOSPITAL_COMMUNITY): Payer: Self-pay

## 2022-12-29 DIAGNOSIS — L821 Other seborrheic keratosis: Secondary | ICD-10-CM | POA: Diagnosis not present

## 2022-12-29 DIAGNOSIS — K219 Gastro-esophageal reflux disease without esophagitis: Secondary | ICD-10-CM | POA: Diagnosis not present

## 2022-12-29 DIAGNOSIS — F03B3 Unspecified dementia, moderate, with mood disturbance: Secondary | ICD-10-CM | POA: Diagnosis not present

## 2022-12-29 DIAGNOSIS — Z6828 Body mass index (BMI) 28.0-28.9, adult: Secondary | ICD-10-CM | POA: Diagnosis not present

## 2022-12-29 DIAGNOSIS — F0393 Unspecified dementia, unspecified severity, with mood disturbance: Secondary | ICD-10-CM | POA: Diagnosis not present

## 2022-12-29 DIAGNOSIS — I10 Essential (primary) hypertension: Secondary | ICD-10-CM | POA: Diagnosis not present

## 2022-12-29 DIAGNOSIS — F331 Major depressive disorder, recurrent, moderate: Secondary | ICD-10-CM | POA: Diagnosis not present

## 2022-12-29 DIAGNOSIS — R109 Unspecified abdominal pain: Secondary | ICD-10-CM | POA: Diagnosis not present

## 2022-12-29 DIAGNOSIS — R600 Localized edema: Secondary | ICD-10-CM | POA: Diagnosis not present

## 2022-12-29 DIAGNOSIS — Z713 Dietary counseling and surveillance: Secondary | ICD-10-CM | POA: Diagnosis not present

## 2022-12-29 NOTE — Telephone Encounter (Signed)
Auth Submission: NO AUTH NEEDED Site of care: Site of care: [facility] Payer: [employer] Medication & CPT/J Code(s) submitted: Prolia (Denosumab) [id number] Route of submission (phone, fax, portal):  Phone # Fax # Auth type: Buy/Bill PB Units/visits requested: 1 dose, q16months Reference number: [id number] Approval from: 12/29/22 to 05/26/23

## 2023-01-01 ENCOUNTER — Encounter (HOSPITAL_COMMUNITY): Payer: Self-pay | Admitting: Pharmacy Technician

## 2023-01-01 ENCOUNTER — Emergency Department (HOSPITAL_COMMUNITY): Payer: PPO

## 2023-01-01 ENCOUNTER — Observation Stay (HOSPITAL_COMMUNITY)
Admission: EM | Admit: 2023-01-01 | Discharge: 2023-01-02 | Disposition: A | Discharge status: Home / Self Care | Payer: PPO | Attending: Family Medicine | Admitting: Family Medicine

## 2023-01-01 ENCOUNTER — Other Ambulatory Visit: Payer: Self-pay

## 2023-01-01 DIAGNOSIS — K5730 Diverticulosis of large intestine without perforation or abscess without bleeding: Secondary | ICD-10-CM | POA: Diagnosis not present

## 2023-01-01 DIAGNOSIS — I480 Paroxysmal atrial fibrillation: Secondary | ICD-10-CM | POA: Insufficient documentation

## 2023-01-01 DIAGNOSIS — Z8673 Personal history of transient ischemic attack (TIA), and cerebral infarction without residual deficits: Secondary | ICD-10-CM | POA: Diagnosis not present

## 2023-01-01 DIAGNOSIS — R079 Chest pain, unspecified: Secondary | ICD-10-CM | POA: Diagnosis not present

## 2023-01-01 DIAGNOSIS — J45909 Unspecified asthma, uncomplicated: Secondary | ICD-10-CM | POA: Insufficient documentation

## 2023-01-01 DIAGNOSIS — R001 Bradycardia, unspecified: Secondary | ICD-10-CM | POA: Diagnosis not present

## 2023-01-01 DIAGNOSIS — R935 Abnormal findings on diagnostic imaging of other abdominal regions, including retroperitoneum: Secondary | ICD-10-CM | POA: Diagnosis not present

## 2023-01-01 DIAGNOSIS — E039 Hypothyroidism, unspecified: Secondary | ICD-10-CM | POA: Diagnosis not present

## 2023-01-01 DIAGNOSIS — Z7901 Long term (current) use of anticoagulants: Secondary | ICD-10-CM | POA: Insufficient documentation

## 2023-01-01 DIAGNOSIS — R109 Unspecified abdominal pain: Secondary | ICD-10-CM | POA: Diagnosis not present

## 2023-01-01 DIAGNOSIS — R42 Dizziness and giddiness: Principal | ICD-10-CM | POA: Insufficient documentation

## 2023-01-01 DIAGNOSIS — I2510 Atherosclerotic heart disease of native coronary artery without angina pectoris: Secondary | ICD-10-CM | POA: Diagnosis not present

## 2023-01-01 DIAGNOSIS — Z79899 Other long term (current) drug therapy: Secondary | ICD-10-CM | POA: Insufficient documentation

## 2023-01-01 DIAGNOSIS — K5900 Constipation, unspecified: Secondary | ICD-10-CM | POA: Diagnosis not present

## 2023-01-01 DIAGNOSIS — I701 Atherosclerosis of renal artery: Secondary | ICD-10-CM | POA: Diagnosis not present

## 2023-01-01 DIAGNOSIS — I10 Essential (primary) hypertension: Secondary | ICD-10-CM | POA: Insufficient documentation

## 2023-01-01 LAB — CBC WITH DIFFERENTIAL/PLATELET
Abs Immature Granulocytes: 0.01 10*3/uL (ref 0.00–0.07)
Basophils Absolute: 0.1 10*3/uL (ref 0.0–0.1)
Basophils Relative: 1 %
Eosinophils Absolute: 0.3 10*3/uL (ref 0.0–0.5)
Eosinophils Relative: 5 %
HCT: 40.6 % (ref 36.0–46.0)
Hemoglobin: 13.2 g/dL (ref 12.0–15.0)
Immature Granulocytes: 0 %
Lymphocytes Relative: 38 %
Lymphs Abs: 2.1 10*3/uL (ref 0.7–4.0)
MCH: 29.5 pg (ref 26.0–34.0)
MCHC: 32.5 g/dL (ref 30.0–36.0)
MCV: 90.8 fL (ref 80.0–100.0)
Monocytes Absolute: 0.6 10*3/uL (ref 0.1–1.0)
Monocytes Relative: 11 %
Neutro Abs: 2.6 10*3/uL (ref 1.7–7.7)
Neutrophils Relative %: 45 %
Platelets: 235 10*3/uL (ref 150–400)
RBC: 4.47 MIL/uL (ref 3.87–5.11)
RDW: 14.6 % (ref 11.5–15.5)
WBC: 5.7 10*3/uL (ref 4.0–10.5)
nRBC: 0 % (ref 0.0–0.2)

## 2023-01-01 LAB — URINALYSIS, W/ REFLEX TO CULTURE (INFECTION SUSPECTED)
Bacteria, UA: NONE SEEN
Bilirubin Urine: NEGATIVE
Glucose, UA: NEGATIVE mg/dL
Hgb urine dipstick: NEGATIVE
Ketones, ur: NEGATIVE mg/dL
Leukocytes,Ua: NEGATIVE
Nitrite: NEGATIVE
Protein, ur: NEGATIVE mg/dL
Specific Gravity, Urine: 1.013 (ref 1.005–1.030)
pH: 8 (ref 5.0–8.0)

## 2023-01-01 LAB — TROPONIN I (HIGH SENSITIVITY)
Troponin I (High Sensitivity): 11 ng/L (ref <18)
Troponin I (High Sensitivity): 10 ng/L (ref <18)

## 2023-01-01 LAB — COMPREHENSIVE METABOLIC PANEL
ALT: 17 U/L (ref 0–44)
AST: 24 U/L (ref 15–41)
Albumin: 4.1 g/dL (ref 3.5–5.0)
Alkaline Phosphatase: 52 U/L (ref 38–126)
Anion gap: 9 (ref 5–15)
BUN: 30 mg/dL — ABNORMAL HIGH (ref 8–23)
CO2: 28 mmol/L (ref 22–32)
Calcium: 9 mg/dL (ref 8.9–10.3)
Chloride: 102 mmol/L (ref 98–111)
Creatinine, Ser: 1.31 mg/dL — ABNORMAL HIGH (ref 0.44–1.00)
GFR, Estimated: 40 mL/min — ABNORMAL LOW (ref >60)
Glucose, Bld: 96 mg/dL (ref 70–99)
Potassium: 3.3 mmol/L — ABNORMAL LOW (ref 3.5–5.1)
Sodium: 139 mmol/L (ref 135–145)
Total Bilirubin: 0.7 mg/dL (ref 0.3–1.2)
Total Protein: 6.5 g/dL (ref 6.5–8.1)

## 2023-01-01 LAB — PROTIME-INR
INR: 1.1 (ref 0.8–1.2)
Prothrombin Time: 14.2 seconds (ref 11.4–15.2)

## 2023-01-01 LAB — MAGNESIUM: Magnesium: 2.4 mg/dL (ref 1.7–2.4)

## 2023-01-01 LAB — TSH: TSH: 1.879 u[IU]/mL (ref 0.350–4.500)

## 2023-01-01 LAB — LIPASE, BLOOD: Lipase: 62 U/L — ABNORMAL HIGH (ref 11–51)

## 2023-01-01 MED ORDER — SODIUM CHLORIDE 0.9 % IV SOLN: INTRAVENOUS | Status: DC | PRN

## 2023-01-01 MED ORDER — AMLODIPINE BESYLATE 5 MG PO TABS
5.0000 mg | ORAL_TABLET | Freq: Every day | ORAL | Status: DC
  Administered 2023-01-01 – 2023-01-02 (×2): 5 mg via ORAL
  Filled 2023-01-01 (×2): qty 1

## 2023-01-01 MED ORDER — POTASSIUM CHLORIDE CRYS ER 10 MEQ PO TBCR
10.0000 meq | EXTENDED_RELEASE_TABLET | Freq: Two times a day (BID) | ORAL | Status: DC
  Administered 2023-01-02: 10 meq via ORAL
  Filled 2023-01-01: qty 1

## 2023-01-01 MED ORDER — APIXABAN 5 MG PO TABS
5.0000 mg | ORAL_TABLET | Freq: Two times a day (BID) | ORAL | Status: DC
  Administered 2023-01-01 – 2023-01-02 (×2): 5 mg via ORAL
  Filled 2023-01-01 (×2): qty 1

## 2023-01-01 MED ORDER — ONDANSETRON HCL 4 MG/2ML IJ SOLN: 4.0000 mg | Freq: Four times a day (QID) | INTRAMUSCULAR | Status: DC | PRN

## 2023-01-01 MED ORDER — ACETAMINOPHEN 650 MG RE SUPP: 650.0000 mg | Freq: Four times a day (QID) | RECTAL | Status: DC | PRN

## 2023-01-01 MED ORDER — MEMANTINE HCL 10 MG PO TABS
10.0000 mg | ORAL_TABLET | Freq: Two times a day (BID) | ORAL | Status: DC
  Administered 2023-01-01 – 2023-01-02 (×2): 10 mg via ORAL
  Filled 2023-01-01 (×2): qty 1

## 2023-01-01 MED ORDER — HYDRALAZINE HCL 20 MG/ML IJ SOLN
10.0000 mg | Freq: Four times a day (QID) | INTRAMUSCULAR | Status: DC | PRN
  Administered 2023-01-01 – 2023-01-02 (×2): 10 mg via INTRAVENOUS
  Filled 2023-01-01 (×2): qty 1

## 2023-01-01 MED ORDER — ONDANSETRON HCL 4 MG PO TABS: 4.0000 mg | ORAL_TABLET | Freq: Four times a day (QID) | ORAL | Status: DC | PRN

## 2023-01-01 MED ORDER — SENNOSIDES-DOCUSATE SODIUM 8.6-50 MG PO TABS
2.0000 | ORAL_TABLET | Freq: Every day | ORAL | Status: DC
  Administered 2023-01-01: 2 via ORAL
  Filled 2023-01-01: qty 2

## 2023-01-01 MED ORDER — ACETAMINOPHEN 325 MG PO TABS
650.0000 mg | ORAL_TABLET | Freq: Four times a day (QID) | ORAL | Status: DC | PRN
  Administered 2023-01-01: 650 mg via ORAL
  Filled 2023-01-01: qty 2

## 2023-01-01 MED ORDER — BISACODYL 10 MG RE SUPP: 10.0000 mg | Freq: Every day | RECTAL | Status: DC | PRN

## 2023-01-01 MED ORDER — FUROSEMIDE 40 MG PO TABS
40.0000 mg | ORAL_TABLET | Freq: Every day | ORAL | Status: DC
  Administered 2023-01-02: 40 mg via ORAL
  Filled 2023-01-01: qty 1

## 2023-01-01 MED ORDER — POLYETHYLENE GLYCOL 3350 17 G PO PACK: 17.0000 g | PACK | Freq: Every day | ORAL | Status: DC | PRN

## 2023-01-01 MED ORDER — POTASSIUM CHLORIDE CRYS ER 20 MEQ PO TBCR
20.0000 meq | EXTENDED_RELEASE_TABLET | Freq: Once | ORAL | Status: AC
  Administered 2023-01-01: 20 meq via ORAL
  Filled 2023-01-01: qty 1

## 2023-01-01 MED ORDER — SODIUM CHLORIDE 0.9% FLUSH
3.0000 mL | Freq: Two times a day (BID) | INTRAVENOUS | Status: DC
  Administered 2023-01-01 – 2023-01-02 (×2): 3 mL via INTRAVENOUS

## 2023-01-01 MED ORDER — LEVOTHYROXINE SODIUM 75 MCG PO TABS
75.0000 ug | ORAL_TABLET | Freq: Every day | ORAL | Status: DC
  Administered 2023-01-02: 75 ug via ORAL
  Filled 2023-01-01: qty 1

## 2023-01-01 MED ORDER — SODIUM CHLORIDE 0.9% FLUSH: 3.0000 mL | INTRAVENOUS | Status: DC | PRN

## 2023-01-01 MED ORDER — TRAZODONE HCL 50 MG PO TABS: 50.0000 mg | ORAL_TABLET | Freq: Every evening | ORAL | Status: DC | PRN

## 2023-01-01 MED ORDER — IOHEXOL 350 MG/ML SOLN
80.0000 mL | Freq: Once | INTRAVENOUS | Status: AC | PRN
  Administered 2023-01-01: 80 mL via INTRAVENOUS

## 2023-01-01 MED ORDER — POTASSIUM CHLORIDE CRYS ER 20 MEQ PO TBCR
40.0000 meq | EXTENDED_RELEASE_TABLET | ORAL | Status: AC
  Administered 2023-01-01 (×2): 40 meq via ORAL
  Filled 2023-01-01 (×2): qty 2

## 2023-01-01 MED ORDER — PANTOPRAZOLE SODIUM 40 MG PO TBEC
40.0000 mg | DELAYED_RELEASE_TABLET | Freq: Every day | ORAL | Status: DC
  Administered 2023-01-02: 40 mg via ORAL
  Filled 2023-01-01: qty 1

## 2023-01-01 MED ORDER — POLYETHYLENE GLYCOL 3350 17 G PO PACK
17.0000 g | PACK | Freq: Two times a day (BID) | ORAL | Status: DC
  Administered 2023-01-01 – 2023-01-02 (×2): 17 g via ORAL
  Filled 2023-01-01 (×2): qty 1

## 2023-01-01 MED ORDER — FLECAINIDE ACETATE 50 MG PO TABS
50.0000 mg | ORAL_TABLET | Freq: Two times a day (BID) | ORAL | Status: DC
  Administered 2023-01-01 – 2023-01-02 (×2): 50 mg via ORAL
  Filled 2023-01-01 (×2): qty 1

## 2023-01-01 MED ORDER — FERROUS GLUCONATE 324 (38 FE) MG PO TABS
324.0000 mg | ORAL_TABLET | Freq: Every day | ORAL | Status: DC
  Administered 2023-01-02: 324 mg via ORAL
  Filled 2023-01-01 (×2): qty 1

## 2023-01-01 MED ORDER — LOSARTAN POTASSIUM 25 MG PO TABS
25.0000 mg | ORAL_TABLET | Freq: Every day | ORAL | Status: DC
  Administered 2023-01-02: 25 mg via ORAL
  Filled 2023-01-01: qty 1

## 2023-01-01 NOTE — H&P (Signed)
Patient Demographics:    [name], is a 87 y.o. female  MRN: [id number]   DOB - 05-Mar-1936  Admit Date - 01/01/2023  Outpatient Primary MD for the patient is Benita Stabile, MD   Assessment & Plan:   Assessment and Plan: 1) symptomatic bradycardia with dizziness--- EKG and cardiac monitor with heart rate in the 40s --EDP discussed case with on-call cardiologist As Per EDP--- cardiologist recommends holding metoprolol rate evaluated in a.m. -TSH 1.88 -Troponin 11, ,  repeat 10 -Potassium is 3.3----keep potassium close to 4- --magnesium is 2.4  2)CKD stage -IV ---Creatinine 1.3 baseline usually around 1.6-1.7 lately... -renally adjust medications, avoid nephrotoxic agents / dehydration  / hypotension  3) abdominal discomfort--- CT angio of the chest abdomen pelvis without acute intra-abdominal findings -Lipase 62 -Clinically doubt significant pancreatitis -Patient with history of chronic constipation -Supportive care  4) leg swelling/leg pains-patient is compliant with Eliquis, defer venous Dopplers -May benefit from TED hose -Lasix as ordered  5)PAFIB--- continue Eliquis for secondary stroke prophylaxis -Continue flecainide hold metoprolol due to bradycardia  6) hypokalemia in the setting of diuretic use----replace  7)HTN--- continue losartan, add amlodipine, hold metoprolol as above, may use IV hydralazine as needed  8) hypothyroidism--- TSH 1.88, c/n levothyroxine  Dispo: The patient is from: Home              Anticipated d/c is to: Home              Anticipated d/c date is: 1 day              Patient currently is not medically stable to d/c. Barriers: Not Clinically Stable-    With History of - Reviewed by me  Past Medical History:  Diagnosis Date   Adenomatous colon polyp 2008   Anxiety     Asthma    has not needed in over a year   Complication of anesthesia    pt reports TIA and sezures after surgery   Coronary artery disease    CVA (cerebral vascular accident) (HCC)    x 3   Degenerative disc disease, lumbar    Depression    Diverticulosis    Dysrhythmia    GERD (gastroesophageal reflux disease)    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60   age 66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 2 weeks   Hemorrhoids 2007   History of kidney stones    HTN (hypertension)    Hyperplastic colon polyp 2005   Hypothyroidism    Narcolepsy    PONV (postoperative nausea and vomiting)    TIA (transient ischemic attack)       Past Surgical History:  Procedure Laterality Date   ABDOMINAL HYSTERECTOMY     age 77, complicated by poor wound healing, followed by revision of scar and radiation for ?malignancy   BACK SURGERY  07/2018   lost 2 inches   BREAST ENHANCEMENT SURGERY     BREAST IMPLANT EXCHANGE Right 06/23/2016  Procedure: RIGHT BREAST IMPLANT REMOVAL AND REPLACEMENT;  Surgeon: Louisa Second, MD;  Location: [facility];  Service: Plastics;  Laterality: Right;   COLONOSCOPY  2005   2 hyperplastic polyps   COLONOSCOPY  2007   Dr. Darrick Penna- hemorrhoids   COLONOSCOPY  2008   Dr. Steva Ready polyp- rare sigmoid diverticulosis, internal hemorrhoids   COLONOSCOPY  12/01/2011   Procedure: COLONOSCOPY;  Surgeon: West Bali, MD;  Location: [facility];  Service: Endoscopy;  Laterality: N/A;  12:30 PM   COLONOSCOPY WITH PROPOFOL N/A 05/22/2022   Procedure: COLONOSCOPY WITH PROPOFOL;  Surgeon: Lanelle Bal, DO;  Location: [facility];  Service: Endoscopy;  Laterality: N/A;  1:00 PM   CYSTOSCOPY/URETEROSCOPY/HOLMIUM LASER/STENT PLACEMENT Left 02/29/2020   Procedure: CYSTOSCOPY/URETEROSCOPY/HOLMIUM LASER/STENT PLACEMENT;  Surgeon: Rene Paci, MD;  Location: [facility];  Service: Urology;  Laterality: Left;   epidural steroid injection     She is getting injections with Dr.  Ethelene Hal q3wks.   ESOPHAGOGASTRODUODENOSCOPY (EGD) WITH ESOPHAGEAL DILATION N/A 04/15/2013   Procedure: ESOPHAGOGASTRODUODENOSCOPY (EGD) WITH ESOPHAGEAL DILATION;  Surgeon: West Bali, MD;  Location: [facility];  Service: Endoscopy;  Laterality: N/A;  11:45-moved to 12:30 [name] to notify pt   EYE SURGERY  2010   EYE SURGERY     IR KYPHO LUMBAR INC FX REDUCE BONE BX UNI/BIL CANNULATION INC/IMAGING  08/23/2018   IR RADIOLOGIST EVAL & MGMT  12/23/2018   POLYPECTOMY  05/22/2022   Procedure: POLYPECTOMY;  Surgeon: Lanelle Bal, DO;  Location: [facility];  Service: Endoscopy;;   right thumb surgery     Chief Complaint  Patient presents with   Abdominal Pain      HPI:    [name]  is a 87 y.o. female with past medical history relevant for paroxysmal atrial fibrillation, chronic anemia, hypertension, hypothyroidism, HLD, history of strokes/TIAs presented to the ED by EMS with right-sided abdominal discomfort that started on 12/31/2022 associated with dizziness. -Off Eliquis.  Patient states that she has been feeling lightheaded on and off--- no frank syncope but at times she feels like she may pass out -Denies current chest pains -Reports history of chronic constipation for which she sees GI team- -some neck discomfort especially right leg with chronic leg swelling -No pleuritic symptoms No fever  Or chills   No Nausea, Vomiting or Diarrhea -CTA angio of the chest abdomen pelvis without evidence of aneurysm or dissection, patient has upcoming diverticulosis, no hydronephrosis noted, she has a small 4 mm stone in the left extrarenal pelvis, coronary artery calcifications noted, -Chest x-ray without acute findings -UA unremarkable -Magnesium is 2.4, potassium 3.3 sodium 139, LFTs are not elevated -Creatinine 1.3 baseline usually around 1.6-1.7 lately... -Lipase 62 -TSH 1.88 -Troponin 11, ,  repeat 10 EKG---Sinus bradycardia Prolonged PR interval Left anterior  fascicular block Low voltage, precordial leads Prolonged QT interval -EDP discussed case with on-call cardiologist As Per EDP--- cardiologist recommends holding metoprolol rate evaluated in a.m.  Review of systems:    In addition to the HPI above,   A full Review of  Systems was done, all other systems reviewed are negative except as noted above in HPI , .    Social History:  Reviewed by me    Social History   Tobacco Use   Smoking status: Never    Passive exposure: Never   Smokeless tobacco: Never  Substance Use Topics   Alcohol use: No    Family History :  Reviewed  by me    Family History  Problem Relation Age of Onset   Colon cancer Father        age 60   Prostate cancer Father    Pancreatic cancer Mother        age 55     Home Medications:   Prior to Admission medications   Medication Sig Start Date End Date Taking? Authorizing Provider  albuterol (VENTOLIN HFA) 108 (90 Base) MCG/ACT inhaler Inhale 1-2 puffs into the lungs every 6 (six) hours as needed for wheezing or shortness of breath.    [provider]  Ascorbic Acid (VITAMIN C) 1000 MG tablet Take 1,000 mg by mouth daily.    [provider]  B Complex Vitamins (VITAMIN B COMPLEX) TABS Take 1 tablet by mouth daily.    [provider]  Biotin 300 MCG TABS Take 300 mcg by mouth daily after supper.     [provider]  Calcium Carb-Cholecalciferol (CALCIUM 600 + D PO) Take 1 tablet by mouth in the morning and at bedtime.    [provider]  ELIQUIS 5 MG TABS tablet Take 5 mg by mouth 2 (two) times daily. 08/19/21   [provider]  ezetimibe (ZETIA) 10 MG tablet Take 1 tablet (10 mg total) by mouth daily. 09/08/18   Sharee Holster, NP  Ferrous Gluconate (IRON) 240 (27 Fe) MG TABS Take 1 tablet by mouth daily. 09/08/18   Sharee Holster, NP  Flaxseed, Linseed, (FLAXSEED OIL) 1000 MG CAPS Take 1 capsule by mouth daily.    [provider]  flecainide  (TAMBOCOR) 50 MG tablet TAKE 1 TABLET(50 MG) BY MOUTH TWICE DAILY 02/11/22   Marinus Maw, MD  furosemide (LASIX) 40 MG tablet Take 1 1/2 tabs (60mg ) by mouth alternating every other day with 1 tab (40mg ) Patient taking differently: Take 40 mg by mouth 2 (two) times daily. Take 1 1/2 tabs (60mg ) by mouth alternating every other day with 1 tab (40mg ) 06/26/22   Antoine Poche, MD  levothyroxine (SYNTHROID) 75 MCG tablet Take 75 mcg by mouth daily before breakfast.    [provider]  losartan (COZAAR) 25 MG tablet Take 25 mg by mouth daily.    [provider]  Lutein 40 MG CAPS Take 40 mg by mouth daily.  08/24/18   [provider]  Magnesium 250 MG TABS Take 250 mg by mouth daily.     [provider]  memantine (NAMENDA) 10 MG tablet Take 1 tablet (10 mg total) by mouth 2 (two) times daily. 12/09/22   Glean Salvo, NP  metoprolol tartrate (LOPRESSOR) 25 MG tablet Take 1 tablet (25 mg total) by mouth 2 (two) times daily. (MAY TAKE AN ADDITIONAL 25MG  AS NEEDED FOR PALPITATIONS) 02/21/21   Antoine Poche, MD  Omega-3 Fatty Acids (FISH OIL) 1200 MG CAPS Take 1,200 mg by mouth daily.    [provider]  OVER THE COUNTER MEDICATION daily at 6 (six) AM. Collagen    [provider]  pantoprazole (PROTONIX) 40 MG tablet Take 1 tablet (40 mg total) by mouth daily. 12/18/18   Johnson, Clanford L, MD  potassium chloride (KLOR-CON M) 10 MEQ tablet Take 10 mEq by mouth 2 (two) times daily. 06/10/21   [provider]  Turmeric 500 MG TABS Take 500 mg by mouth daily.     [provider]  Vitamin D, Cholecalciferol, 25 MCG (1000 UT) TABS Take 1,000 Int'l Units/1.81m2 by mouth  daily at 6 (six) AM.    [provider]  vitamin E 180 MG (400 UNITS) capsule Take 400 Units by mouth daily.    [provider]  Zinc 50 MG TABS Take 50 mg by mouth daily.     [provider]     Allergies:     Allergies  Allergen  Reactions   Epinephrine Anaphylaxis   Demerol [Meperidine] Other (See Comments)    headaches   Vicodin [Hydrocodone-Acetaminophen] Other (See Comments)    York Spaniel it makes her crazy. Pt tolerates acetaminophen     Physical Exam:   Vitals  Blood pressure (!) 129/99, pulse (!) 48, temperature 97.7 F (36.5 C), temperature source Oral, resp. rate 20, SpO2 100%.  Physical Examination: General appearance - alert,  in no distress Mental status - alert, oriented to person, place, and time,  Eyes - sclera anicteric Neck - supple, no JVD elevation , Chest - clear  to auscultation bilaterally, symmetrical air movement,  Heart - S1 and S2 normal, regular , bradycardic Abdomen - soft, nontender, nondistended, +BS Neurological - screening mental status exam normal, neck supple without rigidity, cranial nerves II through XII intact, DTR's normal and symmetric Extremities - +ve pedal edema noted, some tenderness with palpation, intact peripheral pulses Skin - warm, dry   Data Review:    CBC Recent Labs  Lab 01/01/23 1029  WBC 5.7  HGB 13.2  HCT 40.6  PLT 235  MCV 90.8  MCH 29.5  MCHC 32.5  RDW 14.6  LYMPHSABS 2.1  MONOABS 0.6  EOSABS 0.3  BASOSABS 0.1   ------------------------------------------------------------------------------------------------------------------  Chemistries  Recent Labs  Lab 01/01/23 1029 01/01/23 1239  NA 139  --   K 3.3*  --   CL 102  --   CO2 28  --   GLUCOSE 96  --   BUN 30*  --   CREATININE 1.31*  --   CALCIUM 9.0  --   MG  --  2.4  AST 24  --   ALT 17  --   ALKPHOS 52  --   BILITOT 0.7  --    ------------------------------------------------------------------------------------------------------------------ ------------------------------------------------------------------------------------------------------------------ Recent Labs    01/01/23 1239  TSH 1.879     Coagulation profile Recent Labs  Lab 01/01/23 1029  INR 1.1    ------------------------------------------------------------------------------------------------------------------------------------------------------------------------------------------------------------------------------------    Component Value Date/Time   BNP 267.0 (H) 05/12/2020 1615   Urinalysis    Component Value Date/Time   COLORURINE STRAW (A) 01/01/2023 1558   APPEARANCEUR CLEAR 01/01/2023 1558   LABSPEC 1.013 01/01/2023 1558   PHURINE 8.0 01/01/2023 1558   GLUCOSEU NEGATIVE 01/01/2023 1558   HGBUR NEGATIVE 01/01/2023 1558   BILIRUBINUR NEGATIVE 01/01/2023 1558   BILIRUBINUR neg 12/09/2019 0925   KETONESUR NEGATIVE 01/01/2023 1558   PROTEINUR NEGATIVE 01/01/2023 1558   UROBILINOGEN negative (A) 12/09/2019 0925   NITRITE NEGATIVE 01/01/2023 1558   LEUKOCYTESUR NEGATIVE 01/01/2023 1558    ----------------------------------------------------------------------------------------------------------------   Imaging Results:    CT Angio Chest/Abd/Pel for Dissection W and/or Wo Contrast  Result Date: 01/01/2023 CLINICAL DATA:  Right-sided abdominal pain with concern for acute aortic syndrome EXAM: CT ANGIOGRAPHY CHEST, ABDOMEN AND PELVIS TECHNIQUE: Non-contrast CT of the chest was initially obtained. Multidetector CT imaging through the chest, abdomen and pelvis was performed using the standard protocol during bolus administration of intravenous contrast. Multiplanar reconstructed images and MIPs were obtained and reviewed to evaluate the vascular anatomy. RADIATION DOSE REDUCTION: This exam was performed according to the departmental dose-optimization program  which includes automated exposure control, adjustment of the mA and/or kV according to patient size and/or use of iterative reconstruction technique. CONTRAST:  80mL OMNIPAQUE IOHEXOL 350 MG/ML SOLN COMPARISON:  CT abdomen and pelvis dated 11/21/2022 and multiple priors, CT chest dated 12/13/2018 FINDINGS: CTA CHEST FINDINGS  Cardiovascular: Preferential opacification of the thoracic aorta. No evidence of thoracic aortic aneurysm or dissection. Normal heart size. No pericardial effusion. Coronary artery calcifications. Mediastinum/Nodes: Imaged thyroid gland without nodules meeting criteria for imaging follow-up by size. Normal esophagus. No pathologically enlarged axillary, supraclavicular, mediastinal, or hilar lymph nodes. Peripherally calcified left internal mammary lymph node measuring 4 mm is unchanged (5:34). Lungs/Pleura: The central airways are patent. Bilateral lower lobe subsegmental scarring and traction bronchiectasis. No pneumothorax. No pleural effusion. Musculoskeletal: Multilevel degenerative changes of the thoracic spine. Unchanged anterior wedging of T11 and compression deformity of T12 status post augmentation. Bilateral breast implants. Review of the MIP images confirms the above findings. CTA ABDOMEN AND PELVIS FINDINGS VASCULAR Aorta: Aortic atherosclerosis. Normal caliber aorta without aneurysm, dissection, vasculitis or significant stenosis. Celiac: Mild narrowing of the origin secondary to calcified plaque. No evidence of aneurysm, dissection, or vasculitis. SMA: Patent without evidence of aneurysm, dissection, vasculitis or significant stenosis. Renals: Single renal arteries bilaterally. Moderate stenosis of the left renal artery origin due to calcified atherosclerotic plaque. The right renal artery is patent without evidence of aneurysm, dissection, vasculitis, fibromuscular dysplasia or significant stenosis. IMA: Patent without evidence of aneurysm, dissection, vasculitis or significant stenosis. Inflow: Mild narrowing of the proximal right common iliac artery due to atherosclerotic plaque. No evidence of aneurysm, dissection, or vasculitis. Proximal Outflow: Bilateral common femoral and visualized portions of the superficial and profunda femoral arteries are patent without evidence of aneurysm, dissection,  vasculitis or significant stenosis. Veins: No obvious venous abnormality within the limitations of this arterial phase study. Review of the MIP images confirms the above findings. NON-VASCULAR Hepatobiliary: Scattered hypodensities measuring up to 1.3 cm in segment 7 (5:90), present since at least 12/06/2019, likely cysts. No intra or extrahepatic biliary ductal dilation. Normal gallbladder. Pancreas: No focal lesions or main ductal dilation. Spleen: Normal in size without focal abnormality. Adrenals/Urinary Tract: No adrenal nodules. No suspicious renal mass or hydronephrosis. Left extrarenal pelvis contains a layering 4 mm stone, unchanged. Unchanged right interpolar 1.8 cm hyperdense hemorrhagic/proteinaceous cyst. No specific follow-up imaging recommended. Additional right renal subcentimeter hypodensities, too small to characterize but likely cysts. No focal bladder wall thickening. Stomach/Bowel: Normal appearance of the stomach. No evidence of bowel wall thickening, distention, or inflammatory changes. Colonic diverticulosis without acute diverticulitis. Appendectomy. Lymphatic: No enlarged abdominal or pelvic lymph nodes. Reproductive: No adnexal masses. Other: No free fluid, fluid collection, or free air. Musculoskeletal: No acute or abnormal lytic or blastic osseous lesions. Multilevel degenerative changes of the lumbar spine. Review of the MIP images confirms the above findings. IMPRESSION: 1. No evidence of aortic aneurysm or dissection. 2. Colonic diverticulosis without acute diverticulitis. 3. Unchanged 4 mm stone in the left extrarenal pelvis. No hydronephrosis. 4. Aortic Atherosclerosis (ICD10-I70.0). Coronary artery calcifications. Assessment for potential risk factor modification, dietary therapy or pharmacologic therapy may be warranted, if clinically indicated. Electronically Signed   By: Agustin Cree M.D.   On: 01/01/2023 13:11   DG Chest Portable 1 View  Result Date: 01/01/2023 CLINICAL DATA:   chest/abd pain. EXAM: PORTABLE CHEST 1 VIEW COMPARISON:  10/22/2021. FINDINGS: Clear lungs. Stable cardiac and mediastinal contours. No pleural effusion or pneumothorax. Prior T12 kyphoplasty. Visualized upper abdomen is  unremarkable. IMPRESSION: No evidence of acute cardiopulmonary disease. Electronically Signed   By: Orvan Falconer M.D.   On: 01/01/2023 10:43    Radiological Exams on Admission: CT Angio Chest/Abd/Pel for Dissection W and/or Wo Contrast  Result Date: 01/01/2023 CLINICAL DATA:  Right-sided abdominal pain with concern for acute aortic syndrome EXAM: CT ANGIOGRAPHY CHEST, ABDOMEN AND PELVIS TECHNIQUE: Non-contrast CT of the chest was initially obtained. Multidetector CT imaging through the chest, abdomen and pelvis was performed using the standard protocol during bolus administration of intravenous contrast. Multiplanar reconstructed images and MIPs were obtained and reviewed to evaluate the vascular anatomy. RADIATION DOSE REDUCTION: This exam was performed according to the departmental dose-optimization program which includes automated exposure control, adjustment of the mA and/or kV according to patient size and/or use of iterative reconstruction technique. CONTRAST:  80mL OMNIPAQUE IOHEXOL 350 MG/ML SOLN COMPARISON:  CT abdomen and pelvis dated 11/21/2022 and multiple priors, CT chest dated 12/13/2018 FINDINGS: CTA CHEST FINDINGS Cardiovascular: Preferential opacification of the thoracic aorta. No evidence of thoracic aortic aneurysm or dissection. Normal heart size. No pericardial effusion. Coronary artery calcifications. Mediastinum/Nodes: Imaged thyroid gland without nodules meeting criteria for imaging follow-up by size. Normal esophagus. No pathologically enlarged axillary, supraclavicular, mediastinal, or hilar lymph nodes. Peripherally calcified left internal mammary lymph node measuring 4 mm is unchanged (5:34). Lungs/Pleura: The central airways are patent. Bilateral lower lobe  subsegmental scarring and traction bronchiectasis. No pneumothorax. No pleural effusion. Musculoskeletal: Multilevel degenerative changes of the thoracic spine. Unchanged anterior wedging of T11 and compression deformity of T12 status post augmentation. Bilateral breast implants. Review of the MIP images confirms the above findings. CTA ABDOMEN AND PELVIS FINDINGS VASCULAR Aorta: Aortic atherosclerosis. Normal caliber aorta without aneurysm, dissection, vasculitis or significant stenosis. Celiac: Mild narrowing of the origin secondary to calcified plaque. No evidence of aneurysm, dissection, or vasculitis. SMA: Patent without evidence of aneurysm, dissection, vasculitis or significant stenosis. Renals: Single renal arteries bilaterally. Moderate stenosis of the left renal artery origin due to calcified atherosclerotic plaque. The right renal artery is patent without evidence of aneurysm, dissection, vasculitis, fibromuscular dysplasia or significant stenosis. IMA: Patent without evidence of aneurysm, dissection, vasculitis or significant stenosis. Inflow: Mild narrowing of the proximal right common iliac artery due to atherosclerotic plaque. No evidence of aneurysm, dissection, or vasculitis. Proximal Outflow: Bilateral common femoral and visualized portions of the superficial and profunda femoral arteries are patent without evidence of aneurysm, dissection, vasculitis or significant stenosis. Veins: No obvious venous abnormality within the limitations of this arterial phase study. Review of the MIP images confirms the above findings. NON-VASCULAR Hepatobiliary: Scattered hypodensities measuring up to 1.3 cm in segment 7 (5:90), present since at least 12/06/2019, likely cysts. No intra or extrahepatic biliary ductal dilation. Normal gallbladder. Pancreas: No focal lesions or main ductal dilation. Spleen: Normal in size without focal abnormality. Adrenals/Urinary Tract: No adrenal nodules. No suspicious renal mass or  hydronephrosis. Left extrarenal pelvis contains a layering 4 mm stone, unchanged. Unchanged right interpolar 1.8 cm hyperdense hemorrhagic/proteinaceous cyst. No specific follow-up imaging recommended. Additional right renal subcentimeter hypodensities, too small to characterize but likely cysts. No focal bladder wall thickening. Stomach/Bowel: Normal appearance of the stomach. No evidence of bowel wall thickening, distention, or inflammatory changes. Colonic diverticulosis without acute diverticulitis. Appendectomy. Lymphatic: No enlarged abdominal or pelvic lymph nodes. Reproductive: No adnexal masses. Other: No free fluid, fluid collection, or free air. Musculoskeletal: No acute or abnormal lytic or blastic osseous lesions. Multilevel degenerative changes of the lumbar spine. Review  of the MIP images confirms the above findings. IMPRESSION: 1. No evidence of aortic aneurysm or dissection. 2. Colonic diverticulosis without acute diverticulitis. 3. Unchanged 4 mm stone in the left extrarenal pelvis. No hydronephrosis. 4. Aortic Atherosclerosis (ICD10-I70.0). Coronary artery calcifications. Assessment for potential risk factor modification, dietary therapy or pharmacologic therapy may be warranted, if clinically indicated. Electronically Signed   By: Agustin Cree M.D.   On: 01/01/2023 13:11   DG Chest Portable 1 View  Result Date: 01/01/2023 CLINICAL DATA:  chest/abd pain. EXAM: PORTABLE CHEST 1 VIEW COMPARISON:  10/22/2021. FINDINGS: Clear lungs. Stable cardiac and mediastinal contours. No pleural effusion or pneumothorax. Prior T12 kyphoplasty. Visualized upper abdomen is unremarkable. IMPRESSION: No evidence of acute cardiopulmonary disease. Electronically Signed   By: Orvan Falconer M.D.   On: 01/01/2023 10:43    DVT Prophylaxis -SCD/Eliquis AM Labs Ordered, also please review Full Orders  Family Communication: Admission, patients condition and plan of care including tests being ordered have been discussed  with the patient  who indicate understanding and agree with the plan   Condition -stable  Shon Hale M.D on 01/01/2023 at 6:00 PM Go to [email or web address] -  for contact info  [facility] - Office  [phone]

## 2023-01-01 NOTE — ED Triage Notes (Signed)
Pt bib ems from home with R sided abdominal pain onset yesterday. Pt denies NVD or fevers. Hx constipation.

## 2023-01-01 NOTE — ED Provider Notes (Signed)
[place] EMERGENCY DEPARTMENT AT [facility] Provider Note   CSN: [id number] Arrival date & time: 01/01/23  1007     History  Chief Complaint  Patient presents with   Abdominal Pain    [name] is a 87 y.o. female.  HPI 87 year old female presents with abdominal pain and lightheadedness.   Symptoms started yesterday afternoon.  She took a new medicine from her PCP with all of her other medicines and shortly thereafter started feeling symptoms.  She has had upper abdominal pain as well as intermittent lightheadedness.  She gets waves of feeling very lightheaded like she is going to pass out.  She had some pain going from her abdomen up into her chest yesterday but no chest pain currently.  History of chronic constipation though currently she states she is having normal bowel movements.  No urinary symptoms or vomiting.  She has chronic dyspnea and chronic back pain but neither is worse than baseline.  Home Medications Prior to Admission medications   Medication Sig Start Date End Date Taking? Authorizing Provider  albuterol (VENTOLIN HFA) 108 (90 Base) MCG/ACT inhaler Inhale 1-2 puffs into the lungs every 6 (six) hours as needed for wheezing or shortness of breath.    [provider]  Ascorbic Acid (VITAMIN C) 1000 MG tablet Take 1,000 mg by mouth daily.    [provider]  B Complex Vitamins (VITAMIN B COMPLEX) TABS Take 1 tablet by mouth daily.    [provider]  Biotin 300 MCG TABS Take 300 mcg by mouth daily after supper.     [provider]  Calcium Carb-Cholecalciferol (CALCIUM 600 + D PO) Take 1 tablet by mouth in the morning and at bedtime.    [provider]  ELIQUIS 5 MG TABS tablet Take 5 mg by mouth 2 (two) times daily. 08/19/21   [provider]  ezetimibe (ZETIA) 10 MG tablet Take 1 tablet (10 mg total) by mouth daily. 09/08/18   Sharee Holster, NP  Ferrous Gluconate (IRON) 240 (27 Fe) MG TABS  Take 1 tablet by mouth daily. 09/08/18   Sharee Holster, NP  Flaxseed, Linseed, (FLAXSEED OIL) 1000 MG CAPS Take 1 capsule by mouth daily.    [provider]  flecainide (TAMBOCOR) 50 MG tablet TAKE 1 TABLET(50 MG) BY MOUTH TWICE DAILY 02/11/22   Marinus Maw, MD  furosemide (LASIX) 40 MG tablet Take 1 1/2 tabs (60mg ) by mouth alternating every other day with 1 tab (40mg ) Patient taking differently: Take 40 mg by mouth 2 (two) times daily. Take 1 1/2 tabs (60mg ) by mouth alternating every other day with 1 tab (40mg ) 06/26/22   Antoine Poche, MD  levothyroxine (SYNTHROID) 75 MCG tablet Take 75 mcg by mouth daily before breakfast.    [provider]  losartan (COZAAR) 25 MG tablet Take 25 mg by mouth daily.    [provider]  Lutein 40 MG CAPS Take 40 mg by mouth daily.  08/24/18   [provider]  Magnesium 250 MG TABS Take 250 mg by mouth daily.     [provider]  memantine (NAMENDA) 10 MG tablet Take 1 tablet (10 mg total) by mouth 2 (two) times daily. 12/09/22   Glean Salvo, NP  metoprolol tartrate (LOPRESSOR) 25 MG tablet Take 1 tablet (25 mg total) by mouth 2 (two) times daily. (MAY TAKE AN ADDITIONAL 25MG  AS NEEDED FOR PALPITATIONS) 02/21/21   Antoine Poche, MD  Omega-3 Fatty Acids (FISH OIL) 1200 MG CAPS Take 1,200 mg by mouth daily.    [provider]  OVER THE COUNTER MEDICATION daily at 6 (six) AM. Collagen    [provider]  pantoprazole (PROTONIX) 40 MG tablet Take 1 tablet (40 mg total) by mouth daily. 12/18/18   Johnson, Clanford L, MD  potassium chloride (KLOR-CON M) 10 MEQ tablet Take 10 mEq by mouth 2 (two) times daily. 06/10/21   [provider]  Turmeric 500 MG TABS Take 500 mg by mouth daily.     [provider]  Vitamin D, Cholecalciferol, 25 MCG (1000 UT) TABS Take 1,000 Int'l Units/1.57m2 by mouth daily at 6 (six) AM.    [provider]  vitamin E 180 MG (400 UNITS) capsule  Take 400 Units by mouth daily.    [provider]  Zinc 50 MG TABS Take 50 mg by mouth daily.     [provider]      Allergies    Epinephrine, Demerol [meperidine], and Vicodin [hydrocodone-acetaminophen]    Review of Systems   Review of Systems  Constitutional:  Negative for fever.  Cardiovascular:  Positive for chest pain.  Gastrointestinal:  Positive for abdominal pain. Negative for constipation, diarrhea and vomiting.  Genitourinary:  Negative for dysuria.    Physical Exam Updated Vital Signs BP (!) 186/89   Pulse (!) 52   Temp 97.9 F (36.6 C)   Resp 12   SpO2 97%  Physical Exam Vitals and nursing note reviewed.  Constitutional:      Appearance: She is well-developed.  HENT:     Head: Normocephalic and atraumatic.  Cardiovascular:     Rate and Rhythm: Normal rate and regular rhythm.     Heart sounds: Normal heart sounds.  Pulmonary:     Effort: Pulmonary effort is normal.     Breath sounds: Normal breath sounds.  Abdominal:     Palpations: Abdomen is soft.     Tenderness: There is abdominal tenderness in the right upper quadrant and left lower quadrant.  Skin:    General: Skin is warm and dry.  Neurological:     Mental Status: She is alert.     ED Results / Procedures / Treatments   Labs (all labs ordered are listed, but only abnormal results are displayed) Labs Reviewed  COMPREHENSIVE METABOLIC PANEL - Abnormal; Notable for the following components:      Result Value   Potassium 3.3 (*)    BUN 30 (*)    Creatinine, Ser 1.31 (*)    GFR, Estimated 40 (*)    All other components within normal limits  LIPASE, BLOOD - Abnormal; Notable for the following components:   Lipase 62 (*)    All other components within normal limits  CBC WITH DIFFERENTIAL/PLATELET  PROTIME-INR  URINALYSIS, W/ REFLEX TO CULTURE (INFECTION SUSPECTED)  TSH  TROPONIN I (HIGH SENSITIVITY)  TROPONIN I (HIGH SENSITIVITY)    EKG EKG  Interpretation Date/Time:  Thursday January 01 2023 14:23:40 EDT Ventricular Rate:  49 PR Interval:  256 QRS Duration:  112 QT Interval:  558 QTC Calculation: 504 R Axis:   -88  Text Interpretation: Sinus bradycardia Prolonged PR interval Left anterior fascicular block Low voltage, precordial leads Prolonged QT interval Confirmed by Pricilla Loveless [phone]) on 01/01/2023 3:12:25 PM  Radiology CT Angio Chest/Abd/Pel for Dissection W and/or Wo Contrast  Result Date: 01/01/2023 CLINICAL DATA:  Right-sided abdominal pain with concern for acute aortic syndrome EXAM: CT  ANGIOGRAPHY CHEST, ABDOMEN AND PELVIS TECHNIQUE: Non-contrast CT of the chest was initially obtained. Multidetector CT imaging through the chest, abdomen and pelvis was performed using the standard protocol during bolus administration of intravenous contrast. Multiplanar reconstructed images and MIPs were obtained and reviewed to evaluate the vascular anatomy. RADIATION DOSE REDUCTION: This exam was performed according to the departmental dose-optimization program which includes automated exposure control, adjustment of the mA and/or kV according to patient size and/or use of iterative reconstruction technique. CONTRAST:  80mL OMNIPAQUE IOHEXOL 350 MG/ML SOLN COMPARISON:  CT abdomen and pelvis dated 11/21/2022 and multiple priors, CT chest dated 12/13/2018 FINDINGS: CTA CHEST FINDINGS Cardiovascular: Preferential opacification of the thoracic aorta. No evidence of thoracic aortic aneurysm or dissection. Normal heart size. No pericardial effusion. Coronary artery calcifications. Mediastinum/Nodes: Imaged thyroid gland without nodules meeting criteria for imaging follow-up by size. Normal esophagus. No pathologically enlarged axillary, supraclavicular, mediastinal, or hilar lymph nodes. Peripherally calcified left internal mammary lymph node measuring 4 mm is unchanged (5:34). Lungs/Pleura: The central airways are patent. Bilateral lower lobe  subsegmental scarring and traction bronchiectasis. No pneumothorax. No pleural effusion. Musculoskeletal: Multilevel degenerative changes of the thoracic spine. Unchanged anterior wedging of T11 and compression deformity of T12 status post augmentation. Bilateral breast implants. Review of the MIP images confirms the above findings. CTA ABDOMEN AND PELVIS FINDINGS VASCULAR Aorta: Aortic atherosclerosis. Normal caliber aorta without aneurysm, dissection, vasculitis or significant stenosis. Celiac: Mild narrowing of the origin secondary to calcified plaque. No evidence of aneurysm, dissection, or vasculitis. SMA: Patent without evidence of aneurysm, dissection, vasculitis or significant stenosis. Renals: Single renal arteries bilaterally. Moderate stenosis of the left renal artery origin due to calcified atherosclerotic plaque. The right renal artery is patent without evidence of aneurysm, dissection, vasculitis, fibromuscular dysplasia or significant stenosis. IMA: Patent without evidence of aneurysm, dissection, vasculitis or significant stenosis. Inflow: Mild narrowing of the proximal right common iliac artery due to atherosclerotic plaque. No evidence of aneurysm, dissection, or vasculitis. Proximal Outflow: Bilateral common femoral and visualized portions of the superficial and profunda femoral arteries are patent without evidence of aneurysm, dissection, vasculitis or significant stenosis. Veins: No obvious venous abnormality within the limitations of this arterial phase study. Review of the MIP images confirms the above findings. NON-VASCULAR Hepatobiliary: Scattered hypodensities measuring up to 1.3 cm in segment 7 (5:90), present since at least 12/06/2019, likely cysts. No intra or extrahepatic biliary ductal dilation. Normal gallbladder. Pancreas: No focal lesions or main ductal dilation. Spleen: Normal in size without focal abnormality. Adrenals/Urinary Tract: No adrenal nodules. No suspicious renal mass or  hydronephrosis. Left extrarenal pelvis contains a layering 4 mm stone, unchanged. Unchanged right interpolar 1.8 cm hyperdense hemorrhagic/proteinaceous cyst. No specific follow-up imaging recommended. Additional right renal subcentimeter hypodensities, too small to characterize but likely cysts. No focal bladder wall thickening. Stomach/Bowel: Normal appearance of the stomach. No evidence of bowel wall thickening, distention, or inflammatory changes. Colonic diverticulosis without acute diverticulitis. Appendectomy. Lymphatic: No enlarged abdominal or pelvic lymph nodes. Reproductive: No adnexal masses. Other: No free fluid, fluid collection, or free air. Musculoskeletal: No acute or abnormal lytic or blastic osseous lesions. Multilevel degenerative changes of the lumbar spine. Review of the MIP images confirms the above findings. IMPRESSION: 1. No evidence of aortic aneurysm or dissection. 2. Colonic diverticulosis without acute diverticulitis. 3. Unchanged 4 mm stone in the left extrarenal pelvis. No hydronephrosis. 4. Aortic Atherosclerosis (ICD10-I70.0). Coronary artery calcifications. Assessment for potential risk factor modification, dietary therapy or pharmacologic therapy may be warranted,  if clinically indicated. Electronically Signed   By: Agustin Cree M.D.   On: 01/01/2023 13:11   DG Chest Portable 1 View  Result Date: 01/01/2023 CLINICAL DATA:  chest/abd pain. EXAM: PORTABLE CHEST 1 VIEW COMPARISON:  10/22/2021. FINDINGS: Clear lungs. Stable cardiac and mediastinal contours. No pleural effusion or pneumothorax. Prior T12 kyphoplasty. Visualized upper abdomen is unremarkable. IMPRESSION: No evidence of acute cardiopulmonary disease. Electronically Signed   By: Orvan Falconer M.D.   On: 01/01/2023 10:43    Procedures Procedures    Medications Ordered in ED Medications  iohexol (OMNIPAQUE) 350 MG/ML injection 80 mL (80 mLs Intravenous Contrast Given 01/01/23 1215)  potassium chloride SA (KLOR-CON  M) CR tablet 20 mEq (20 mEq Oral Given 01/01/23 1421)    ED Course/ Medical Decision Making/ A&P                                 Medical Decision Making Amount and/or Complexity of Data Reviewed Labs: ordered.    Details: Normal troponin x 2.  Kidney function improved from baseline Radiology: ordered and independent interpretation performed.    Details: No aortic dissection ECG/medicine tests: ordered and independent interpretation performed.    Details: Sinus bradycardia  Risk Prescription drug management. Decision regarding hospitalization.   Patient has had on and off near syncope symptoms.  I suspect this is related to a degree of bradycardia.  Mostly she is in the 58s but often on the monitor (which I have reviewed and interpreted) she will be in the low 40s and 1 time was captured in the 39 area.  This seems always to be sinus.  I have not found any long pauses on cardiac monitoring.  However I discussed with Dr. Diona Browner, seems reasonable to watch her overnight given her near syncope symptoms and hold her metoprolol.  Keep the flecainide and Eliquis on.  Otherwise, no clear findings for why she is having pain.  Discussed with Dr. Mariea Clonts for admission.        Final Clinical Impression(s) / ED Diagnoses Final diagnoses:  Bradycardia    Rx / DC Orders ED Discharge Orders     None         Pricilla Loveless, MD 01/01/23 1527

## 2023-01-02 DIAGNOSIS — J45909 Unspecified asthma, uncomplicated: Secondary | ICD-10-CM | POA: Diagnosis not present

## 2023-01-02 DIAGNOSIS — I10 Essential (primary) hypertension: Secondary | ICD-10-CM | POA: Diagnosis not present

## 2023-01-02 DIAGNOSIS — R42 Dizziness and giddiness: Secondary | ICD-10-CM | POA: Diagnosis not present

## 2023-01-02 DIAGNOSIS — R413 Other amnesia: Secondary | ICD-10-CM | POA: Diagnosis not present

## 2023-01-02 DIAGNOSIS — Z8673 Personal history of transient ischemic attack (TIA), and cerebral infarction without residual deficits: Secondary | ICD-10-CM | POA: Diagnosis not present

## 2023-01-02 DIAGNOSIS — I2510 Atherosclerotic heart disease of native coronary artery without angina pectoris: Secondary | ICD-10-CM | POA: Diagnosis not present

## 2023-01-02 DIAGNOSIS — I480 Paroxysmal atrial fibrillation: Secondary | ICD-10-CM | POA: Diagnosis not present

## 2023-01-02 DIAGNOSIS — Z7901 Long term (current) use of anticoagulants: Secondary | ICD-10-CM | POA: Diagnosis not present

## 2023-01-02 DIAGNOSIS — E039 Hypothyroidism, unspecified: Secondary | ICD-10-CM | POA: Diagnosis not present

## 2023-01-02 DIAGNOSIS — Z79899 Other long term (current) drug therapy: Secondary | ICD-10-CM | POA: Diagnosis not present

## 2023-01-02 MED ORDER — ALBUTEROL SULFATE HFA 108 (90 BASE) MCG/ACT IN AERS: 2.0000 | INHALATION_SPRAY | Freq: Four times a day (QID) | RESPIRATORY_TRACT | 1 refills | Status: DC | PRN

## 2023-01-02 MED ORDER — PANTOPRAZOLE SODIUM 40 MG PO TBEC: 40.0000 mg | DELAYED_RELEASE_TABLET | Freq: Every day | ORAL | 5 refills | Status: DC

## 2023-01-02 MED ORDER — SENNOSIDES-DOCUSATE SODIUM 8.6-50 MG PO TABS: 2.0000 | ORAL_TABLET | Freq: Every day | ORAL | 2 refills | Status: DC

## 2023-01-02 MED ORDER — FUROSEMIDE 40 MG PO TABS: 40.0000 mg | ORAL_TABLET | Freq: Every day | ORAL | 3 refills | Status: DC

## 2023-01-02 MED ORDER — AMLODIPINE BESYLATE 10 MG PO TABS: 10.0000 mg | ORAL_TABLET | Freq: Every day | ORAL | 6 refills | Status: DC

## 2023-01-02 MED ORDER — POTASSIUM CHLORIDE ER 20 MEQ PO TBCR: 20.0000 meq | EXTENDED_RELEASE_TABLET | Freq: Every day | ORAL | 1 refills | Status: DC

## 2023-01-02 MED ORDER — APIXABAN 2.5 MG PO TABS: 2.5000 mg | ORAL_TABLET | Freq: Two times a day (BID) | ORAL | 3 refills | Status: DC

## 2023-01-02 MED ORDER — DIPHENHYDRAMINE HCL 25 MG PO CAPS
25.0000 mg | ORAL_CAPSULE | Freq: Once | ORAL | Status: AC | PRN
  Administered 2023-01-02: 25 mg via ORAL
  Filled 2023-01-02: qty 1

## 2023-01-02 NOTE — Plan of Care (Signed)

## 2023-01-02 NOTE — Care Management Obs Status (Signed)
MEDICARE OBSERVATION STATUS NOTIFICATION   Patient Details  Name: [name] MRN: [id number] Date of Birth: 03-Aug-1935   Medicare Observation Status Notification Given:  Yes    Corey Harold 01/02/2023, 2:55 PM

## 2023-01-02 NOTE — Progress Notes (Signed)
   01/02/23 1356  TOC Brief Assessment  Insurance and Status Reviewed  Patient has primary care physician Yes  Home environment has been reviewed Home alone  Prior level of function: independent  Prior/Current Home Services No current home services  Social Determinants of Health Reivew SDOH reviewed no interventions necessary  Readmission risk has been reviewed Yes  Transition of care needs no transition of care needs at this time   Transition of Care Department [facility]) has reviewed patient and no TOC needs have been identified at this time. We will continue to monitor patient advancement through interdisciplinary progression rounds. If new patient transition needs arise, please place a TOC consult.

## 2023-01-02 NOTE — TOC Transition Note (Signed)
Transition of Care [facility]) - CM/SW Discharge Note   Patient Details  Name: [name] MRN: [id number] Date of Birth: 1935/10/25  [facility]) CM/SW Contact:  Leitha Bleak, RN Phone Number: 01/02/2023, 9:37 PM  Late discharge, patient going home, MD sent chat to [facility] to set up home health. Patient has HTA, we will need to find an accepting agency or pair. TOC will follow and work on setting up home health. Weekend staff updated.     Final next level of care: Home w Home Health Services  Discharge Plan and Services Additional resources added to the After Visit Summary for       Social Determinants of Health (SDOH) Interventions SDOH Screenings   Food Insecurity: No Food Insecurity (01/01/2023)  Housing: Patient Declined (01/01/2023)  Transportation Needs: No Transportation Needs (01/01/2023)  Utilities: Not At Risk (01/01/2023)  Alcohol Screen: Low Risk  (02/06/2022)  Depression (PHQ2-9): Low Risk  (02/06/2022)  Financial Resource Strain: Low Risk  (02/06/2022)  Physical Activity: Inactive (02/06/2022)  Social Connections: Moderately Integrated (02/06/2022)  Stress: No Stress Concern Present (02/06/2022)  Tobacco Use: Low Risk  (01/01/2023)     Readmission Risk Interventions     No data to display

## 2023-01-02 NOTE — Discharge Instructions (Signed)
1)Stop Metoprolol--due to concerns about slow heart rate 2) take amlodipine for blood pressure as prescribed 3) follow-up with cardiologist in about a week or so for recheck you may need a holter/Zio/heart monitor 4) please reduce Eliquis/apixaban to 2.5 mg twice a day from 5 mg twice a day

## 2023-01-02 NOTE — Discharge Summary (Signed)
[name], is a 87 y.o. female  DOB 21-Jun-1935  MRN [id number].  Admission date:  01/01/2023  Admitting Physician  Shon Hale, MD  Discharge Date:  01/02/2023   Primary MD  Benita Stabile, MD  Recommendations for primary care physician for things to follow:   1)Stop Metoprolol--due to concerns about slow heart rate 2) take amlodipine for blood pressure as prescribed 3) follow-up with cardiologist in about a week or so for recheck you may need a holter/Zio/heart monitor 4) please reduce Eliquis/apixaban to 2.5 mg twice a day from 5 mg twice a day  Admission Diagnosis  Bradycardia [R00.1]   Discharge Diagnosis  Bradycardia [R00.1]  Active Problems:   Acquired hypothyroidism   Essential hypertension   Paroxysmal atrial fibrillation [facility])      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61   age 60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 2 weeks   Hemorrhoids 2007   History of kidney stones    HTN (hypertension)    Hyperplastic colon polyp 2005   Hypothyroidism    Narcolepsy    PONV (postoperative nausea and vomiting)    TIA (transient ischemic attack)     Past Surgical History:  Procedure Laterality Date   ABDOMINAL HYSTERECTOMY     age 19, complicated by poor wound healing, followed by revision of scar and radiation for ?malignancy   BACK SURGERY  07/2018   lost 2 inches   BREAST ENHANCEMENT SURGERY     BREAST IMPLANT EXCHANGE Right 06/23/2016   Procedure: RIGHT BREAST IMPLANT REMOVAL AND REPLACEMENT;  Surgeon: Louisa Second,  MD;  Location: [facility];  Service: Plastics;  Laterality: Right;   COLONOSCOPY  2005   2 hyperplastic polyps   COLONOSCOPY  2007   Dr. Darrick Penna- hemorrhoids   COLONOSCOPY  2008   Dr. Steva Ready polyp- rare sigmoid diverticulosis, internal hemorrhoids   COLONOSCOPY  12/01/2011   Procedure: COLONOSCOPY;  Surgeon: West Bali, MD;  Location: [facility];  Service: Endoscopy;  Laterality: N/A;  12:30 PM   COLONOSCOPY WITH PROPOFOL N/A 05/22/2022   Procedure: COLONOSCOPY WITH PROPOFOL;  Surgeon: Lanelle Bal, DO;  Location: [facility];  Service: Endoscopy;  Laterality: N/A;  1:00 PM   CYSTOSCOPY/URETEROSCOPY/HOLMIUM LASER/STENT PLACEMENT Left 02/29/2020   Procedure: CYSTOSCOPY/URETEROSCOPY/HOLMIUM LASER/STENT PLACEMENT;  Surgeon: Rene Paci, MD;  Location: [facility];  Service: Urology;  Laterality: Left;   epidural steroid injection     She is getting injections with Dr. Ethelene Hal q3wks.   ESOPHAGOGASTRODUODENOSCOPY (EGD) WITH ESOPHAGEAL DILATION N/A 04/15/2013  Procedure: ESOPHAGOGASTRODUODENOSCOPY (EGD) WITH ESOPHAGEAL DILATION;  Surgeon: West Bali, MD;  [facility];  Service: Endoscopy;  Laterality: N/A;  11:45-moved to 12:30 [name] to notify pt   EYE SURGERY  2010   EYE SURGERY     IR KYPHO LUMBAR INC FX REDUCE BONE BX UNI/BIL CANNULATION INC/IMAGING  08/23/2018   IR RADIOLOGIST EVAL & MGMT  12/23/2018   POLYPECTOMY  05/22/2022   Procedure: POLYPECTOMY;  Surgeon: Lanelle Bal, DO;  Location: [facility];  Service: Endoscopy;;   right thumb surgery       HPI  from the history and physical done on the day of admission:    [name]  is a 87 y.o. female with past medical history relevant for paroxysmal atrial fibrillation, chronic anemia, hypertension, hypothyroidism, HLD, history of strokes/TIAs presented to the ED by EMS with right-sided abdominal discomfort that started on 12/31/2022 associated with dizziness. -Off  Eliquis.  Patient states that she has been feeling lightheaded on and off--- no frank syncope but at times she feels like she may pass out -Denies current chest pains -Reports history of chronic constipation for which she sees GI team- -some neck discomfort especially right leg with chronic leg swelling -No pleuritic symptoms No fever  Or chills    No Nausea, Vomiting or Diarrhea -CTA angio of the chest abdomen pelvis without evidence of aneurysm or dissection, patient has upcoming diverticulosis, no hydronephrosis noted, she has a small 4 mm stone in the left extrarenal pelvis, coronary artery calcifications noted, -Chest x-ray without acute findings -UA unremarkable -Magnesium is 2.4, potassium 3.3 sodium 139, LFTs are not elevated -Creatinine 1.3 baseline usually around 1.6-1.7 lately... -Lipase 62 -TSH 1.88 -Troponin 11, ,  repeat 10 EKG---Sinus bradycardia Prolonged PR interval Left anterior fascicular block Low voltage, precordial leads Prolonged QT interval -EDP discussed case with on-call cardiologist As Per EDP--- cardiologist recommends holding metoprolol rate evaluated in a.m.     Hospital Course:   Assessment and Plan: 1) symptomatic bradycardia with dizziness--- EKG and cardiac monitor with heart rate in the 40s --EDP discussed case with on-call cardiologist As Per EDP--- cardiologist recommends holding metoprolol rate evaluated in a.m. -TSH 1.88 -Troponin 11, ,  repeat 10 -Potassium is 3.3----keep potassium close to 4- --magnesium is 2.4   2)CKD stage -IV ---Creatinine 1.3 baseline usually around 1.6-1.7 lately... -renally adjust medications, avoid nephrotoxic agents / dehydration  / hypotension   3) abdominal discomfort--- CT angio of the chest abdomen pelvis without acute intra-abdominal findings -Lipase 62 -Clinically doubt significant pancreatitis -Patient with history of chronic constipation -Supportive care   4) leg swelling/leg pains-patient is  compliant with Eliquis, defer venous Dopplers -May benefit from TED hose -Lasix as ordered   5)PAFIB--- continue Eliquis for secondary stroke prophylaxis -Continue flecainide hold metoprolol due to bradycardia   6) hypokalemia in the setting of diuretic use----replace   7)HTN--- continue losartan, add amlodipine, hold metoprolol as above, may use IV hydralazine as needed   8) hypothyroidism--- TSH 1.88, c/n levothyroxine   -Home Health  RN and physical therapy requested   Dispo: The patient is from: Home              Anticipated d/c is to: Home  Discharge Condition: ***  Follow UP   Follow-up Information     Antoine Poche, MD. Schedule an appointment as soon as possible for a visit in 1 week(s).   Specialty: Cardiology Contact information: [address] [place]  [address] [phone]                  Consults obtained -cardiology  Diet and Activity recommendation:  As advised  Discharge Instructions    **** Discharge Instructions     Call MD for:  difficulty breathing, headache or visual disturbances   Complete by: As directed    Call MD for:  persistant dizziness or light-headedness   Complete by: As directed    Call MD for:  persistant nausea and vomiting   Complete by: As directed    Call MD for:  temperature >100.4   Complete by: As directed    Diet - low sodium heart healthy   Complete by: As directed    Discharge instructions   Complete by: As directed    1)Stop Metoprolol--due to concerns about slow heart rate 2) take amlodipine for blood pressure as prescribed 3) follow-up with cardiologist in about a week or so for recheck you may need a holter/Zio/heart monitor 4) please reduce Eliquis/apixaban to 2.5 mg twice a day from 5 mg twice a day   Increase activity slowly   Complete by: As directed        Discharge Medications     Allergies as of 01/02/2023       Reactions   Epinephrine Anaphylaxis   Demerol [meperidine] Other  (See Comments)   headaches   Vicodin [hydrocodone-acetaminophen] Other (See Comments)   York Spaniel it makes her crazy. Pt tolerates acetaminophen        Medication List     STOP taking these medications    Biotin 300 MCG Tabs   Flaxseed Oil 1000 MG Caps   Lutein 40 MG Caps   metoprolol tartrate 25 MG tablet Commonly known as: LOPRESSOR   OVER THE COUNTER MEDICATION   potassium chloride 10 MEQ tablet Commonly known as: KLOR-CON M   Turmeric 500 MG Tabs   Zinc 50 MG Tabs       TAKE these medications    albuterol 108 (90 Base) MCG/ACT inhaler Commonly known as: VENTOLIN HFA Inhale 2 puffs into the lungs every 6 (six) hours as needed for wheezing or shortness of breath. What changed: how much to take   amLODipine 10 MG tablet Commonly known as: NORVASC Take 1 tablet (10 mg total) by mouth daily. For BP   apixaban 2.5 MG Tabs tablet Commonly known as: Eliquis Take 1 tablet (2.5 mg total) by mouth 2 (two) times daily. What changed:  medication strength how much to take   CALCIUM 600 + D PO Take 1 tablet by mouth in the morning and at bedtime.   ezetimibe 10 MG tablet Commonly known as: ZETIA Take 1 tablet (10 mg total) by mouth daily.   Fish Oil 1200 MG Caps Take 1,200 mg by mouth daily.   flecainide 50 MG tablet Commonly known as: TAMBOCOR TAKE 1 TABLET(50 MG) BY MOUTH TWICE DAILY   furosemide 40 MG tablet Commonly known as: LASIX Take 1 tablet (40 mg total) by mouth daily. Start taking on: January 03, 2023 What changed:  how much to take how to take this when to take this additional instructions   Iron 240 (27 Fe) MG Tabs Take 1 tablet by mouth daily.   levothyroxine 75 MCG tablet Commonly known as: SYNTHROID Take 75 mcg by mouth daily before breakfast.   losartan 25 MG tablet Commonly known as: COZAAR Take 25 mg by mouth daily.   Magnesium 250 MG Tabs Take 250 mg by  mouth daily.   memantine 10 MG tablet Commonly known as: Namenda Take  1 tablet (10 mg total) by mouth 2 (two) times daily.   pantoprazole 40 MG tablet Commonly known as: PROTONIX Take 1 tablet (40 mg total) by mouth daily.   Potassium Chloride ER 20 MEQ Tbcr Take 1 tablet (20 mEq total) by mouth daily. 1 tab daily by mouth--- take while taking Lasix/furosemide   senna-docusate 8.6-50 MG tablet Commonly known as: Senokot-S Take 2 tablets by mouth at bedtime.   Vitamin B Complex Tabs Take 1 tablet by mouth daily.   vitamin C 1000 MG tablet Take 1,000 mg by mouth daily.   Vitamin D (Cholecalciferol) 25 MCG (1000 UT) Tabs Take 1,000 Int'l Units/1.79m2 by mouth daily at 6 (six) AM.   vitamin E 180 MG (400 UNITS) capsule Take 400 Units by mouth daily.        Major procedures and Radiology Reports - PLEASE review detailed and final reports for all details, in brief -   ***  CT Angio Chest/Abd/Pel for Dissection W and/or Wo Contrast  Result Date: 01/01/2023 CLINICAL DATA:  Right-sided abdominal pain with concern for acute aortic syndrome EXAM: CT ANGIOGRAPHY CHEST, ABDOMEN AND PELVIS TECHNIQUE: Non-contrast CT of the chest was initially obtained. Multidetector CT imaging through the chest, abdomen and pelvis was performed using the standard protocol during bolus administration of intravenous contrast. Multiplanar reconstructed images and MIPs were obtained and reviewed to evaluate the vascular anatomy. RADIATION DOSE REDUCTION: This exam was performed according to the departmental dose-optimization program which includes automated exposure control, adjustment of the mA and/or kV according to patient size and/or use of iterative reconstruction technique. CONTRAST:  80mL OMNIPAQUE IOHEXOL 350 MG/ML SOLN COMPARISON:  CT abdomen and pelvis dated 11/21/2022 and multiple priors, CT chest dated 12/13/2018 FINDINGS: CTA CHEST FINDINGS Cardiovascular: Preferential opacification of the thoracic aorta. No evidence of thoracic aortic aneurysm or dissection. Normal heart  size. No pericardial effusion. Coronary artery calcifications. Mediastinum/Nodes: Imaged thyroid gland without nodules meeting criteria for imaging follow-up by size. Normal esophagus. No pathologically enlarged axillary, supraclavicular, mediastinal, or hilar lymph nodes. Peripherally calcified left internal mammary lymph node measuring 4 mm is unchanged (5:34). Lungs/Pleura: The central airways are patent. Bilateral lower lobe subsegmental scarring and traction bronchiectasis. No pneumothorax. No pleural effusion. Musculoskeletal: Multilevel degenerative changes of the thoracic spine. Unchanged anterior wedging of T11 and compression deformity of T12 status post augmentation. Bilateral breast implants. Review of the MIP images confirms the above findings. CTA ABDOMEN AND PELVIS FINDINGS VASCULAR Aorta: Aortic atherosclerosis. Normal caliber aorta without aneurysm, dissection, vasculitis or significant stenosis. Celiac: Mild narrowing of the origin secondary to calcified plaque. No evidence of aneurysm, dissection, or vasculitis. SMA: Patent without evidence of aneurysm, dissection, vasculitis or significant stenosis. Renals: Single renal arteries bilaterally. Moderate stenosis of the left renal artery origin due to calcified atherosclerotic plaque. The right renal artery is patent without evidence of aneurysm, dissection, vasculitis, fibromuscular dysplasia or significant stenosis. IMA: Patent without evidence of aneurysm, dissection, vasculitis or significant stenosis. Inflow: Mild narrowing of the proximal right common iliac artery due to atherosclerotic plaque. No evidence of aneurysm, dissection, or vasculitis. Proximal Outflow: Bilateral common femoral and visualized portions of the superficial and profunda femoral arteries are patent without evidence of aneurysm, dissection, vasculitis or significant stenosis. Veins: No obvious venous abnormality within the limitations of this arterial phase study. Review of  the MIP images confirms the above findings. NON-VASCULAR Hepatobiliary: Scattered hypodensities measuring up to 1.3  cm in segment 7 (5:90), present since at least 12/06/2019, likely cysts. No intra or extrahepatic biliary ductal dilation. Normal gallbladder. Pancreas: No focal lesions or main ductal dilation. Spleen: Normal in size without focal abnormality. Adrenals/Urinary Tract: No adrenal nodules. No suspicious renal mass or hydronephrosis. Left extrarenal pelvis contains a layering 4 mm stone, unchanged. Unchanged right interpolar 1.8 cm hyperdense hemorrhagic/proteinaceous cyst. No specific follow-up imaging recommended. Additional right renal subcentimeter hypodensities, too small to characterize but likely cysts. No focal bladder wall thickening. Stomach/Bowel: Normal appearance of the stomach. No evidence of bowel wall thickening, distention, or inflammatory changes. Colonic diverticulosis without acute diverticulitis. Appendectomy. Lymphatic: No enlarged abdominal or pelvic lymph nodes. Reproductive: No adnexal masses. Other: No free fluid, fluid collection, or free air. Musculoskeletal: No acute or abnormal lytic or blastic osseous lesions. Multilevel degenerative changes of the lumbar spine. Review of the MIP images confirms the above findings. IMPRESSION: 1. No evidence of aortic aneurysm or dissection. 2. Colonic diverticulosis without acute diverticulitis. 3. Unchanged 4 mm stone in the left extrarenal pelvis. No hydronephrosis. 4. Aortic Atherosclerosis (ICD10-I70.0). Coronary artery calcifications. Assessment for potential risk factor modification, dietary therapy or pharmacologic therapy may be warranted, if clinically indicated. Electronically Signed   By: Agustin Cree M.D.   On: 01/01/2023 13:11   DG Chest Portable 1 View  Result Date: 01/01/2023 CLINICAL DATA:  chest/abd pain. EXAM: PORTABLE CHEST 1 VIEW COMPARISON:  10/22/2021. FINDINGS: Clear lungs. Stable cardiac and mediastinal contours. No  pleural effusion or pneumothorax. Prior T12 kyphoplasty. Visualized upper abdomen is unremarkable. IMPRESSION: No evidence of acute cardiopulmonary disease. Electronically Signed   By: Orvan Falconer M.D.   On: 01/01/2023 10:43    Micro Results   *** No results found for this or any previous visit (from the past 240 hour(s)).  Today   Subjective    [name] today has no ***          Patient has been seen and examined prior to discharge   Objective   Blood pressure (!) 133/56, pulse (!) 59, temperature 98.2 F (36.8 C), temperature source Oral, resp. rate 20, height 5' (1.524 m), SpO2 95%.   Intake/Output Summary (Last 24 hours) at 01/02/2023 1711 Last data filed at 01/02/2023 0100 Gross per 24 hour  Intake 120 ml  Output --  Net 120 ml    Exam Gen:- Awake Alert, no acute distress *** HEENT:- [place].AT, No sclera icterus Neck-Supple Neck,No JVD,.  Lungs-  CTAB , good air movement bilaterally CV- S1, S2 normal, regular Abd-  +ve B.Sounds, Abd Soft, No tenderness,    Extremity/Skin:- No  edema,   good pulses Psych-affect is appropriate, oriented x3 Neuro-no new focal deficits, no tremors ***   Data Review   CBC w Diff:  Lab Results  Component Value Date   WBC 5.7 01/01/2023   HGB 13.2 01/01/2023   HCT 40.6 01/01/2023   PLT 235 01/01/2023   LYMPHOPCT 38 01/01/2023   MONOPCT 11 01/01/2023   EOSPCT 5 01/01/2023   BASOPCT 1 01/01/2023    CMP:  Lab Results  Component Value Date   NA 138 01/02/2023   K 4.2 01/02/2023   CL 106 01/02/2023   CO2 23 01/02/2023   BUN 27 (H) 01/02/2023   CREATININE 1.39 (H) 01/02/2023   CREATININE 1.00 (H) 01/11/2015   PROT 6.5 01/01/2023   ALBUMIN 4.1 01/01/2023   BILITOT 0.7 01/01/2023   ALKPHOS 52 01/01/2023   AST 24 01/01/2023   ALT 17  01/01/2023  .  Total Discharge time is about 33 minutes  Shon Hale M.D on 01/02/2023 at 5:11 PM  Go to [email or web address] -  for contact info  [facility] - Office   [phone]

## 2023-01-02 NOTE — Consult Note (Addendum)
Cardiology Consultation   Patient ID: JOSUE SANFRATELLO MRN: [id number]; DOB: [birth year]  Admit date: 01/01/2023 Date of Consult: 01/02/2023  PCP:  Benita Stabile, MD   [facility] Providers Cardiologist:  Dina Rich, MD  Electrophysiologist:  Lewayne Bunting, MD       Patient Profile:   TYNISHIA AVARA is a 87 y.o. female with a hx of paroxysmal atrial fibrillation (started on Flecainide in 08/2021), history of chest pain (low-risk NST's in 2015, 2019 and 10/2020), HTN, hypothyroidism and prior CVA's who is being seen 01/02/2023 for the evaluation of bradycardia at the request of Dr. Mariea Clonts.  History of Present Illness:   Ms. Trejos was evaluated by Dr. Wyline Mood in 06/2022 and reported very rare palpitations but symptoms had improved since being on Flecainide. Recent labs had shown an uptrend in her creatinine, therefore Lasix was reduced from 60 mg daily to 60 mg daily alternating with 40 mg daily. She was continued on Eliquis 5 mg twice daily, Zetia 10 mg daily, Flecainide 50 mg twice daily, Losartan 25 mg daily and Lopressor 25 mg twice daily.  She presented to [facility] ED on 01/01/2023 for evaluation of right sided abdominal pain. Also reported intermittent lightheadedness and presyncope but denied any actual syncopal events. Initial labs in the ED showed WBC 5.7, Hgb 13.2, platelets 235, Na+ 139, K+ 3.3 and creatinine 1.31 (improved from 1.79 in 10/2022).  AST 24, ALT 17 and alk phos 52. Magnesium 2.4. Hs troponin values negative at 11 and 10. TSH normal at 1.879. CXR showed no evidence of acute cardiopulmonary abnormalities. CTA showed no evidence of aortic aneurysm or dissection. Was noted to have chronic diverticulosis without diverticulitis along with a 4 mm stone in the left extrarenal pelvis but no hydronephrosis. The report also mentioned aortic atherosclerosis and coronary artery calcification. EKG showed sinus bradycardia, heart rate 54 with first-degree AV  block and LAFB. Computer-generated QTc at 494 ms but manually calculated at 455 ms. Repeat EKG pending for today.   Her case was reviewed with Dr. Diona Browner yesterday afternoon as her heart rate was in the high 30's to 50's while in the ED. He recommended stopping her Lopressor but continuing Flecainide 50 mg twice daily and Eliquis.  In talking with the patient today, her history is limited as she reports having significant short-term memory loss since her prior strokes. Says that she has been feeling "off" for the past several days. Says she was evaluated by Dr. Margo Aye and was given a sample of an unknown medication and feels like symptoms acutely progressed after that.  Reports dizziness which occurs at rest or with activity. Has baseline dyspnea on exertion but no acute changes in this. Also reports intermittent episodes of a sharp chest pain lasting for a few seconds which has been occurring for many years. No acute changes in this either. She denies any specific orthopnea or PND. Does experience intermittent lower extremity edema and takes Lasix daily. She also provides a list today of 17 supplements that she takes daily and questions if these could be contributing to her symptoms.   Past Medical History:  Diagnosis Date   Adenomatous colon polyp 2008   Anxiety    Asthma    has not needed in over a year   Complication of anesthesia    pt reports TIA and sezures after surgery   Coronary artery disease    CVA (cerebral vascular accident) (HCC)    x 3   Degenerative disc  disease, lumbar    Depression    Diverticulosis    Dysrhythmia    GERD (gastroesophageal reflux disease)    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83   age 65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 2 weeks   Hemorrhoids 2007   History of kidney stones    HTN (hypertension)    Hyperplastic colon polyp 2005   Hypothyroidism    Narcolepsy    PONV (postoperative nausea and vomiting)    TIA (transient ischemic attack)     Past Surgical History:  Procedure  Laterality Date   ABDOMINAL HYSTERECTOMY     age 63, complicated by poor wound healing, followed by revision of scar and radiation for ?malignancy   BACK SURGERY  07/2018   lost 2 inches   BREAST ENHANCEMENT SURGERY     BREAST IMPLANT EXCHANGE Right 06/23/2016   Procedure: RIGHT BREAST IMPLANT REMOVAL AND REPLACEMENT;  Surgeon: Louisa Second, MD;  Location: [facility];  Service: Plastics;  Laterality: Right;   COLONOSCOPY  2005   2 hyperplastic polyps   COLONOSCOPY  2007   Dr. Darrick Penna- hemorrhoids   COLONOSCOPY  2008   Dr. Steva Ready polyp- rare sigmoid diverticulosis, internal hemorrhoids   COLONOSCOPY  12/01/2011   Procedure: COLONOSCOPY;  Surgeon: West Bali, MD;  Location: [facility];  Service: Endoscopy;  Laterality: N/A;  12:30 PM   COLONOSCOPY WITH PROPOFOL N/A 05/22/2022   Procedure: COLONOSCOPY WITH PROPOFOL;  Surgeon: Lanelle Bal, DO;  Location: [facility];  Service: Endoscopy;  Laterality: N/A;  1:00 PM   CYSTOSCOPY/URETEROSCOPY/HOLMIUM LASER/STENT PLACEMENT Left 02/29/2020   Procedure: CYSTOSCOPY/URETEROSCOPY/HOLMIUM LASER/STENT PLACEMENT;  Surgeon: Rene Paci, MD;  Location: [facility];  Service: Urology;  Laterality: Left;   epidural steroid injection     She is getting injections with Dr. Ethelene Hal q3wks.   ESOPHAGOGASTRODUODENOSCOPY (EGD) WITH ESOPHAGEAL DILATION N/A 04/15/2013   Procedure: ESOPHAGOGASTRODUODENOSCOPY (EGD) WITH ESOPHAGEAL DILATION;  Surgeon: West Bali, MD;  Location: [facility];  Service: Endoscopy;  Laterality: N/A;  11:45-moved to 12:30 [name] to notify pt   EYE SURGERY  2010   EYE SURGERY     IR KYPHO LUMBAR INC FX REDUCE BONE BX UNI/BIL CANNULATION INC/IMAGING  08/23/2018   IR RADIOLOGIST EVAL & MGMT  12/23/2018   POLYPECTOMY  05/22/2022   Procedure: POLYPECTOMY;  Surgeon: Lanelle Bal, DO;  Location: [facility];  Service: Endoscopy;;   right thumb surgery       Home Medications:  Prior  to Admission medications   Medication Sig Start Date End Date Taking? Authorizing Provider  albuterol (VENTOLIN HFA) 108 (90 Base) MCG/ACT inhaler Inhale 1-2 puffs into the lungs every 6 (six) hours as needed for wheezing or shortness of breath.    [provider]  Ascorbic Acid (VITAMIN C) 1000 MG tablet Take 1,000 mg by mouth daily.    [provider]  B Complex Vitamins (VITAMIN B COMPLEX) TABS Take 1 tablet by mouth daily.    [provider]  Biotin 300 MCG TABS Take 300 mcg by mouth daily after supper.     [provider]  Calcium Carb-Cholecalciferol (CALCIUM 600 + D PO) Take 1 tablet by mouth in the morning and at bedtime.    [provider]  ELIQUIS 5 MG TABS tablet Take 5 mg by mouth 2 (two) times daily. 08/19/21   [provider]  ezetimibe (ZETIA) 10 MG tablet Take 1 tablet (10 mg total) by mouth daily. 09/08/18  Sharee Holster, NP  Ferrous Gluconate (IRON) 240 (27 Fe) MG TABS Take 1 tablet by mouth daily. 09/08/18   Sharee Holster, NP  Flaxseed, Linseed, (FLAXSEED OIL) 1000 MG CAPS Take 1 capsule by mouth daily.    [provider]  flecainide (TAMBOCOR) 50 MG tablet TAKE 1 TABLET(50 MG) BY MOUTH TWICE DAILY 02/11/22   Marinus Maw, MD  furosemide (LASIX) 40 MG tablet Take 1 1/2 tabs (60mg ) by mouth alternating every other day with 1 tab (40mg ) Patient taking differently: Take 40 mg by mouth 2 (two) times daily. Take 1 1/2 tabs (60mg ) by mouth alternating every other day with 1 tab (40mg ) 06/26/22   Antoine Poche, MD  levothyroxine (SYNTHROID) 75 MCG tablet Take 75 mcg by mouth daily before breakfast.    [provider]  losartan (COZAAR) 25 MG tablet Take 25 mg by mouth daily.    [provider]  Lutein 40 MG CAPS Take 40 mg by mouth daily.  08/24/18   [provider]  Magnesium 250 MG TABS Take 250 mg by mouth daily.     [provider]  memantine (NAMENDA) 10 MG tablet Take 1  tablet (10 mg total) by mouth 2 (two) times daily. 12/09/22   Glean Salvo, NP  metoprolol tartrate (LOPRESSOR) 25 MG tablet Take 1 tablet (25 mg total) by mouth 2 (two) times daily. (MAY TAKE AN ADDITIONAL 25MG  AS NEEDED FOR PALPITATIONS) 02/21/21   Antoine Poche, MD  Omega-3 Fatty Acids (FISH OIL) 1200 MG CAPS Take 1,200 mg by mouth daily.    [provider]  OVER THE COUNTER MEDICATION daily at 6 (six) AM. Collagen    [provider]  pantoprazole (PROTONIX) 40 MG tablet Take 1 tablet (40 mg total) by mouth daily. 12/18/18   Johnson, Clanford L, MD  potassium chloride (KLOR-CON M) 10 MEQ tablet Take 10 mEq by mouth 2 (two) times daily. 06/10/21   [provider]  Turmeric 500 MG TABS Take 500 mg by mouth daily.     [provider]  Vitamin D, Cholecalciferol, 25 MCG (1000 UT) TABS Take 1,000 Int'l Units/1.81m2 by mouth daily at 6 (six) AM.    [provider]  vitamin E 180 MG (400 UNITS) capsule Take 400 Units by mouth daily.    [provider]  Zinc 50 MG TABS Take 50 mg by mouth daily.     [provider]    Inpatient Medications: Scheduled Meds:  amLODipine  5 mg Oral Daily   apixaban  5 mg Oral BID   ferrous gluconate  324 mg Oral Q breakfast   flecainide  50 mg Oral Q12H   furosemide  40 mg Oral Daily   levothyroxine  75 mcg Oral QAC breakfast   losartan  25 mg Oral Daily   memantine  10 mg Oral BID   pantoprazole  40 mg Oral Daily   polyethylene glycol  17 g Oral BID   potassium chloride  10 mEq Oral BID   senna-docusate  2 tablet Oral QHS   sodium chloride flush  3 mL Intravenous Q12H   sodium chloride flush  3 mL Intravenous Q12H   Continuous Infusions:  sodium chloride     PRN Meds: sodium chloride, acetaminophen **OR** acetaminophen, bisacodyl, hydrALAZINE, ondansetron **OR** ondansetron (ZOFRAN) IV, sodium chloride flush, traZODone  Allergies:    Allergies  Allergen Reactions   Epinephrine Anaphylaxis    Demerol [Meperidine] Other (See Comments)  headaches   Vicodin [Hydrocodone-Acetaminophen] Other (See Comments)    York Spaniel it makes her crazy. Pt tolerates acetaminophen    Social History:   Social History   Socioeconomic History   Marital status: Widowed    Spouse name: Not on file   Number of children: 1   Years of education: 62   Highest education level: 12th grade  Occupational History   Not on file  Tobacco Use   Smoking status: Never    Passive exposure: Never   Smokeless tobacco: Never  Vaping Use   Vaping status: Never Used  Substance and Sexual Activity   Alcohol use: No   Drug use: No   Sexual activity: Not Currently    Partners: Male  Other Topics Concern   Not on file  Social History Narrative   Lives alone   Right Handed    Drinks no caffeine daily   Social Determinants of Health   Financial Resource Strain: Low Risk  (02/06/2022)   Overall Financial Resource Strain (CARDIA)    Difficulty of Paying Living Expenses: Not hard at all  Food Insecurity: No Food Insecurity (01/01/2023)   Hunger Vital Sign    Worried About Running Out of Food in the Last Year: Never true    Ran Out of Food in the Last Year: Never true  Transportation Needs: No Transportation Needs (01/01/2023)   PRAPARE - Administrator, Civil Service (Medical): No    Lack of Transportation (Non-Medical): No  Physical Activity: Inactive (02/06/2022)   Exercise Vital Sign    Days of Exercise per Week: 0 days    Minutes of Exercise per Session: 0 min  Stress: No Stress Concern Present (02/06/2022)   [employer] of Occupational Health - Occupational Stress Questionnaire    Feeling of Stress : Only a little  Social Connections: Moderately Integrated (02/06/2022)   Social Connection and Isolation Panel [NHANES]    Frequency of Communication with Friends and Family: More than three times a week    Frequency of Social Gatherings with Friends and Family: More than three times a week     Attends Religious Services: More than 4 times per year    Active Member of [employer] or Organizations: Yes    Attends Banker Meetings: More than 4 times per year    Marital Status: Widowed  Intimate Partner Violence: Not At Risk (01/01/2023)   Humiliation, Afraid, Rape, and Kick questionnaire    Fear of Current or Ex-Partner: No    Emotionally Abused: No    Physically Abused: No    Sexually Abused: No    Family History:    Family History  Problem Relation Age of Onset   Colon cancer Father        age 28   Prostate cancer Father    Pancreatic cancer Mother        age 52     ROS:  Please see the history of present illness.   All other ROS reviewed and negative.     Physical Exam/Data:   Vitals:   01/01/23 2110 01/01/23 2206 01/02/23 0103 01/02/23 0430  BP: (!) 115/49 (!) 103/50 (!) 142/48 (!) 178/63  Pulse: (!) 110 (!) 50 (!) 56 63  Resp:  20 16 18   Temp: 97.9 F (36.6 C) 98.6 F (37 C) 98 F (36.7 C) 98.1 F (36.7 C)  TempSrc: Oral  Oral Oral  SpO2: 100% 100% 100% 99%  Height:  Intake/Output Summary (Last 24 hours) at 01/02/2023 0833 Last data filed at 01/02/2023 0100 Gross per 24 hour  Intake 120 ml  Output --  Net 120 ml      12/18/2022    9:11 AM 12/09/2022   10:38 AM 11/06/2022    8:59 AM  Last 3 Weights  Weight (lbs) 132 lb 12.8 oz 132 lb 11.5 oz 132 lb 12.8 oz  Weight (kg) 60.238 kg 60.2 kg 60.238 kg     Body mass index is 25.94 kg/m.  General: Elderly female appearing in no acute distress HEENT: normal Neck: no JVD Vascular: No carotid bruits; Distal pulses 2+ bilaterally Cardiac:  normal S1, S2; RRR; no murmur Lungs:  clear to auscultation bilaterally, no wheezing, rhonchi or rales  Abd: soft, nontender, no hepatomegaly  Ext: no pitting edema Musculoskeletal:  No deformities, BUE and BLE strength normal and equal Skin: warm and dry  Neuro:  CNs 2-12 intact, no focal abnormalities noted Psych:  Normal affect   EKG:  The EKG  was personally reviewed and demonstrates: Sinus bradycardia, heart rate 54 with first-degree AV block and LAFB. Computer-generated QTc at 494 ms but manually calculated at 455 ms. Repeat EKG pending for today.   Telemetry:  Telemetry was personally reviewed and demonstrates: NSR with HR mostly in the 50's to 60's. Episodes of sinus bradycardia, HR in 40's at times but no high-grade block or significant pauses.   Relevant CV Studies:  NST: 10/2020 Lexiscan stress is electrically negative for ischemia Myoview scan shows normal perfusion No ischemia or scar LVEF calculated at 88% Low risk study  Echocardiogram: 02/2022 IMPRESSIONS     1. Left ventricular ejection fraction, by estimation, is 60 to 65%. The  left ventricle has normal function. The left ventricle has no regional  wall motion abnormalities. There is mild left ventricular hypertrophy.  Left ventricular diastolic parameters  are consistent with Grade III diastolic dysfunction (restrictive).   2. Right ventricular systolic function is normal. The right ventricular  size is normal.   3. Left atrial size was mildly dilated.   4. Mitral Valve Area (MVA) = 1.76 cm2. The mitral valve is degenerative.  Mild mitral valve regurgitation. Mild mitral stenosis. Moderate mitral  annular calcification.   5. The aortic valve was not assessed. Aortic valve regurgitation is not  visualized.   Comparison(s): Similar to prior; heart rates low in both studies.   Event Monitor: 03/2022 NSR with sinus bradycardia, down to 32/min Occaisional PAC's No atrial fib, VT or SVT No symptoms to correlate sinus brady with atrial fib.   Gregg Taylor,MD Patch Wear Time:  7 days and 5 hours (2023-10-17T11:20:57-0400 to 2023-10-24T16:51:29-0400)   Patient had a min HR of 32 bpm, max HR of 84 bpm, and avg HR of 53 bpm. Predominant underlying rhythm was Sinus Rhythm. Isolated SVEs were occasional (1.5%, 7295), SVE Couplets were rare (<1.0%, 96), and no  SVE Triplets were present. No Isolated VEs, VE  Couplets, or VE Triplets were present.   Laboratory Data:  High Sensitivity Troponin:   Recent Labs  Lab 01/01/23 1029 01/01/23 1239  TROPONINIHS 11 10     Chemistry Recent Labs  Lab 01/01/23 1029 01/01/23 1239 01/02/23 0428  NA 139  --  138  K 3.3*  --  4.2  CL 102  --  106  CO2 28  --  23  GLUCOSE 96  --  93  BUN 30*  --  27*  CREATININE 1.31*  --  1.39*  CALCIUM 9.0  --  8.9  MG  --  2.4  --   GFRNONAA 40*  --  37*  ANIONGAP 9  --  9    Recent Labs  Lab 01/01/23 1029  PROT 6.5  ALBUMIN 4.1  AST 24  ALT 17  ALKPHOS 52  BILITOT 0.7   Lipids No results for input(s): "CHOL", "TRIG", "HDL", "LABVLDL", "LDLCALC", "CHOLHDL" in the last 168 hours.  Hematology Recent Labs  Lab 01/01/23 1029  WBC 5.7  RBC 4.47  HGB 13.2  HCT 40.6  MCV 90.8  MCH 29.5  MCHC 32.5  RDW 14.6  PLT 235   Thyroid  Recent Labs  Lab 01/01/23 1239  TSH 1.879    BNPNo results for input(s): "BNP", "PROBNP" in the last 168 hours.  DDimer No results for input(s): "DDIMER" in the last 168 hours.   Radiology/Studies:  CT Angio Chest/Abd/Pel for Dissection W and/or Wo Contrast  Result Date: 01/01/2023 CLINICAL DATA:  Right-sided abdominal pain with concern for acute aortic syndrome EXAM: CT ANGIOGRAPHY CHEST, ABDOMEN AND PELVIS TECHNIQUE: Non-contrast CT of the chest was initially obtained. Multidetector CT imaging through the chest, abdomen and pelvis was performed using the standard protocol during bolus administration of intravenous contrast. Multiplanar reconstructed images and MIPs were obtained and reviewed to evaluate the vascular anatomy. RADIATION DOSE REDUCTION: This exam was performed according to the departmental dose-optimization program which includes automated exposure control, adjustment of the mA and/or kV according to patient size and/or use of iterative reconstruction technique. CONTRAST:  80mL OMNIPAQUE IOHEXOL 350 MG/ML  SOLN COMPARISON:  CT abdomen and pelvis dated 11/21/2022 and multiple priors, CT chest dated 12/13/2018 FINDINGS: CTA CHEST FINDINGS Cardiovascular: Preferential opacification of the thoracic aorta. No evidence of thoracic aortic aneurysm or dissection. Normal heart size. No pericardial effusion. Coronary artery calcifications. Mediastinum/Nodes: Imaged thyroid gland without nodules meeting criteria for imaging follow-up by size. Normal esophagus. No pathologically enlarged axillary, supraclavicular, mediastinal, or hilar lymph nodes. Peripherally calcified left internal mammary lymph node measuring 4 mm is unchanged (5:34). Lungs/Pleura: The central airways are patent. Bilateral lower lobe subsegmental scarring and traction bronchiectasis. No pneumothorax. No pleural effusion. Musculoskeletal: Multilevel degenerative changes of the thoracic spine. Unchanged anterior wedging of T11 and compression deformity of T12 status post augmentation. Bilateral breast implants. Review of the MIP images confirms the above findings. CTA ABDOMEN AND PELVIS FINDINGS VASCULAR Aorta: Aortic atherosclerosis. Normal caliber aorta without aneurysm, dissection, vasculitis or significant stenosis. Celiac: Mild narrowing of the origin secondary to calcified plaque. No evidence of aneurysm, dissection, or vasculitis. SMA: Patent without evidence of aneurysm, dissection, vasculitis or significant stenosis. Renals: Single renal arteries bilaterally. Moderate stenosis of the left renal artery origin due to calcified atherosclerotic plaque. The right renal artery is patent without evidence of aneurysm, dissection, vasculitis, fibromuscular dysplasia or significant stenosis. IMA: Patent without evidence of aneurysm, dissection, vasculitis or significant stenosis. Inflow: Mild narrowing of the proximal right common iliac artery due to atherosclerotic plaque. No evidence of aneurysm, dissection, or vasculitis. Proximal Outflow: Bilateral common  femoral and visualized portions of the superficial and profunda femoral arteries are patent without evidence of aneurysm, dissection, vasculitis or significant stenosis. Veins: No obvious venous abnormality within the limitations of this arterial phase study. Review of the MIP images confirms the above findings. NON-VASCULAR Hepatobiliary: Scattered hypodensities measuring up to 1.3 cm in segment 7 (5:90), present since at least 12/06/2019, likely cysts. No intra or extrahepatic biliary ductal dilation. Normal gallbladder. Pancreas: No focal lesions  or main ductal dilation. Spleen: Normal in size without focal abnormality. Adrenals/Urinary Tract: No adrenal nodules. No suspicious renal mass or hydronephrosis. Left extrarenal pelvis contains a layering 4 mm stone, unchanged. Unchanged right interpolar 1.8 cm hyperdense hemorrhagic/proteinaceous cyst. No specific follow-up imaging recommended. Additional right renal subcentimeter hypodensities, too small to characterize but likely cysts. No focal bladder wall thickening. Stomach/Bowel: Normal appearance of the stomach. No evidence of bowel wall thickening, distention, or inflammatory changes. Colonic diverticulosis without acute diverticulitis. Appendectomy. Lymphatic: No enlarged abdominal or pelvic lymph nodes. Reproductive: No adnexal masses. Other: No free fluid, fluid collection, or free air. Musculoskeletal: No acute or abnormal lytic or blastic osseous lesions. Multilevel degenerative changes of the lumbar spine. Review of the MIP images confirms the above findings. IMPRESSION: 1. No evidence of aortic aneurysm or dissection. 2. Colonic diverticulosis without acute diverticulitis. 3. Unchanged 4 mm stone in the left extrarenal pelvis. No hydronephrosis. 4. Aortic Atherosclerosis (ICD10-I70.0). Coronary artery calcifications. Assessment for potential risk factor modification, dietary therapy or pharmacologic therapy may be warranted, if clinically indicated.  Electronically Signed   By: Agustin Cree M.D.   On: 01/01/2023 13:11   DG Chest Portable 1 View  Result Date: 01/01/2023 CLINICAL DATA:  chest/abd pain. EXAM: PORTABLE CHEST 1 VIEW COMPARISON:  10/22/2021. FINDINGS: Clear lungs. Stable cardiac and mediastinal contours. No pleural effusion or pneumothorax. Prior T12 kyphoplasty. Visualized upper abdomen is unremarkable. IMPRESSION: No evidence of acute cardiopulmonary disease. Electronically Signed   By: Orvan Falconer M.D.   On: 01/01/2023 10:43     Assessment and Plan:   1. Dizziness/Bradycardia - She reports dizziness started after she was given a sample of medication by her PCP and is unable to recall the name of this. I did call her PCP's office this morning but was unable to speak with someone and left a voicemail to clarify the medication. - She was bradycardic while in the ED with heart rate in the 40's to 50's with recommendations to hold Lopressor. Heart rate has improved to the 50's to 60's this morning and she does feel like her dizziness is improving as well. - At this time, will continue to hold Lopressor and follow rates on telemetry. No evidence of high-grade AV block or significant pauses at this time. If telemetry is reassuring this admission, could consider a repeat monitor as an outpatient (prior monitor in 03/2022 did show sinus bradycardia with heart rate down in the 30's at times and average HR of 53 bpm and was continued on Lopressor at that time). She is also on 17 different OTC supplements and I encouraged her to review these with her pharmacist to verify no interactions with her prescribed medications.   2. Paroxysmal Atrial Fibrillation - She is maintaining normal sinus rhythm this admission. Lopressor is held as outlined above and she has been continued on Flecainide 50 mg twice daily. Her QTc was prolonged on initial EKG but manually calculated at 455 ms. Repeat EKG is pending for this morning.  - She is on Eliquis 5 mg  twice daily for anticoagulation. Will ask for a repeat weight to be obtained as her dose may need to be reduced to 2.5 mg twice daily pending reassessment of this given her age of 87 yo.  3. History of Chest Pain - She has a history of chest pain dating back for years with prior low-risk NST's in 2015, 2019 and 10/2020. Reports still having occasional episodes of sharp chest pain lasting for seconds but no  specific angina. - Troponin values have been negative this admission. No plans for further ischemic testing at this time.  4. HTN - Her blood pressure has been elevated since admission, at 178/63 on most recent check. She is scheduled to receive Amlodipine 5 mg daily, Lasix 40 mg daily and Losartan 25 mg daily this morning. If BP remains elevated, would recommend further titration of Losartan. Avoid AV nodal blocking agents given her bradycardia.   5. Prior CVA's/Memory Loss - She reports progressive memory loss since having multiple CVA's and TIA's.  She does have short-term memory loss on examination today and difficulty with word finding but reports no acute changes in this. A&Ox3.    For questions or updates, please contact [facility] Please consult [email or web address] for contact info under    Signed, Ellsworth Lennox, PA-C  01/02/2023 8:33 AM   Attending attestation  Patient seen and independently examined with Randall An, PA-C. We discussed all aspects of the encounter. I agree with the assessment and plan as stated above.   Patient is a 87 year old F known to have paroxysmal A-fib on flecainide, HTN, prior CVAs presented to ER with dizziness. EKG showed severe sinus bradycardia, HR 40s with first-degree AV block.  After holding home dose of metoprolol, HR improved to 50-70s.  Despite improvement in heart rates, she continues to report dizziness.  Also reports chest pains but does not remember the duration, frequency and quality.  She also reports intermittent  headaches, pressure in quality, occurred multiple times during my interview.  Reports short memory loss.  Exam showed patient not in acute respite distress, HEENT normal, no JVD, S1-S2 normal, clear lungs, abdomen NTND, no edema in legs. For dizziness, HR improved from 40s to 50-70s off metoprolol but she continues to have dizziness.  TSH within normal limits.  Will check chronotropic competence by making her walk in the hallway. I think her dizziness is related to prior history of strokes versus new memory loss. Needs outpatient neurology evaluation.  In addition, she takes around 17 herbal supplements and her PCP will need to check for any potential drug to drug interactions.  EKG shows NSR, first-degree AV block and normal QTc interval.  For paroxysmal A-fib, she was started on flecainide by EP in 2023, cautious use of flecainide in elderly, will defer this to Dr. Wyline Mood and Dr. Ladona Ridgel.  Continue Eliquis 5 mg twice daily, no risk of falls.  For chest pains, chronic, she had low risk nuclear stress test in 2015, 2019 and 2022.  She has appointment with Dr. Wyline Mood on 02/03/2023 and chest pain can be addressed at that time.  [facility] will sign off.   Medication Recommendations: Flecainide 50 mg twice daily (cautious use in elderly, will defer this to EP and her primary cardiologist), Eliquis 5 mg twice daily, amlodipine 5 mg once daily, losartan 25 mg once daily, p.o. Lasix 40 mg once daily, KCl 10 mEq twice daily. Okay to go home if she has chronotropic competence. Other recommendations (labs, testing, etc): None Follow up as an outpatient: Keep appointment with Dr. Wyline Mood on 02/03/2023    Verne Spurr, MD [place]  [facility]  2:24 PM

## 2023-01-08 ENCOUNTER — Encounter (HOSPITAL_COMMUNITY): Admission: RE | Admit: 2023-01-08 | Payer: PPO | Source: Ambulatory Visit

## 2023-01-08 ENCOUNTER — Encounter: Payer: PPO | Attending: Internal Medicine

## 2023-01-08 VITALS — BP 149/66 | HR 91 | Temp 97.9°F | Resp 18

## 2023-01-08 DIAGNOSIS — M810 Age-related osteoporosis without current pathological fracture: Secondary | ICD-10-CM

## 2023-01-08 MED ORDER — DENOSUMAB 60 MG/ML ~~LOC~~ SOSY
60.0000 mg | PREFILLED_SYRINGE | Freq: Once | SUBCUTANEOUS | Status: AC
  Administered 2023-01-08: 60 mg via SUBCUTANEOUS

## 2023-01-08 NOTE — Progress Notes (Signed)
Diagnosis: Osteoporosis  Provider:  Katrinka Blazing MD  Procedure: Injection  Prolia (Denosumab), Dose: 60 mg, Site: subcutaneous, Number of injections: 1  Post Care: Patient declined observation  Discharge: Condition: Good, Destination: Home . AVS Declined  Performed by:  Marlow Baars [place], RN

## 2023-01-12 DIAGNOSIS — E785 Hyperlipidemia, unspecified: Secondary | ICD-10-CM | POA: Diagnosis not present

## 2023-01-12 DIAGNOSIS — I444 Left anterior fascicular block: Secondary | ICD-10-CM | POA: Diagnosis not present

## 2023-01-12 DIAGNOSIS — I129 Hypertensive chronic kidney disease with stage 1 through stage 4 chronic kidney disease, or unspecified chronic kidney disease: Secondary | ICD-10-CM | POA: Diagnosis not present

## 2023-01-12 DIAGNOSIS — M5136 Other intervertebral disc degeneration, lumbar region: Secondary | ICD-10-CM | POA: Diagnosis not present

## 2023-01-12 DIAGNOSIS — E039 Hypothyroidism, unspecified: Secondary | ICD-10-CM | POA: Diagnosis not present

## 2023-01-12 DIAGNOSIS — I2510 Atherosclerotic heart disease of native coronary artery without angina pectoris: Secondary | ICD-10-CM | POA: Diagnosis not present

## 2023-01-12 DIAGNOSIS — N184 Chronic kidney disease, stage 4 (severe): Secondary | ICD-10-CM | POA: Diagnosis not present

## 2023-01-12 DIAGNOSIS — F419 Anxiety disorder, unspecified: Secondary | ICD-10-CM | POA: Diagnosis not present

## 2023-01-12 DIAGNOSIS — K5730 Diverticulosis of large intestine without perforation or abscess without bleeding: Secondary | ICD-10-CM | POA: Diagnosis not present

## 2023-01-12 DIAGNOSIS — Z7901 Long term (current) use of anticoagulants: Secondary | ICD-10-CM | POA: Diagnosis not present

## 2023-01-12 DIAGNOSIS — J45909 Unspecified asthma, uncomplicated: Secondary | ICD-10-CM | POA: Diagnosis not present

## 2023-01-12 DIAGNOSIS — F32A Depression, unspecified: Secondary | ICD-10-CM | POA: Diagnosis not present

## 2023-01-12 DIAGNOSIS — Z9181 History of falling: Secondary | ICD-10-CM | POA: Diagnosis not present

## 2023-01-12 DIAGNOSIS — Z86010 Personal history of colonic polyps: Secondary | ICD-10-CM | POA: Diagnosis not present

## 2023-01-12 DIAGNOSIS — G47419 Narcolepsy without cataplexy: Secondary | ICD-10-CM | POA: Diagnosis not present

## 2023-01-12 DIAGNOSIS — K219 Gastro-esophageal reflux disease without esophagitis: Secondary | ICD-10-CM | POA: Diagnosis not present

## 2023-01-12 DIAGNOSIS — Z8673 Personal history of transient ischemic attack (TIA), and cerebral infarction without residual deficits: Secondary | ICD-10-CM | POA: Diagnosis not present

## 2023-01-12 DIAGNOSIS — D631 Anemia in chronic kidney disease: Secondary | ICD-10-CM | POA: Diagnosis not present

## 2023-01-12 DIAGNOSIS — I700 Atherosclerosis of aorta: Secondary | ICD-10-CM | POA: Diagnosis not present

## 2023-01-12 DIAGNOSIS — I480 Paroxysmal atrial fibrillation: Secondary | ICD-10-CM | POA: Diagnosis not present

## 2023-01-12 DIAGNOSIS — K649 Unspecified hemorrhoids: Secondary | ICD-10-CM | POA: Diagnosis not present

## 2023-01-14 ENCOUNTER — Other Ambulatory Visit: Payer: Self-pay

## 2023-01-14 ENCOUNTER — Emergency Department (HOSPITAL_COMMUNITY): Payer: PPO

## 2023-01-14 ENCOUNTER — Encounter (HOSPITAL_COMMUNITY): Payer: Self-pay

## 2023-01-14 ENCOUNTER — Emergency Department (HOSPITAL_COMMUNITY)
Admission: EM | Admit: 2023-01-14 | Discharge: 2023-01-14 | Disposition: A | Discharge status: Home / Self Care | Payer: PPO | Attending: Emergency Medicine | Admitting: Emergency Medicine

## 2023-01-14 DIAGNOSIS — K824 Cholesterolosis of gallbladder: Secondary | ICD-10-CM | POA: Diagnosis not present

## 2023-01-14 DIAGNOSIS — I447 Left bundle-branch block, unspecified: Secondary | ICD-10-CM | POA: Diagnosis not present

## 2023-01-14 DIAGNOSIS — G8929 Other chronic pain: Secondary | ICD-10-CM | POA: Insufficient documentation

## 2023-01-14 DIAGNOSIS — J45909 Unspecified asthma, uncomplicated: Secondary | ICD-10-CM | POA: Diagnosis not present

## 2023-01-14 DIAGNOSIS — I4891 Unspecified atrial fibrillation: Secondary | ICD-10-CM | POA: Diagnosis not present

## 2023-01-14 DIAGNOSIS — R069 Unspecified abnormalities of breathing: Secondary | ICD-10-CM | POA: Diagnosis not present

## 2023-01-14 DIAGNOSIS — Z7901 Long term (current) use of anticoagulants: Secondary | ICD-10-CM | POA: Diagnosis not present

## 2023-01-14 DIAGNOSIS — R001 Bradycardia, unspecified: Secondary | ICD-10-CM | POA: Diagnosis not present

## 2023-01-14 DIAGNOSIS — R1084 Generalized abdominal pain: Secondary | ICD-10-CM | POA: Diagnosis not present

## 2023-01-14 DIAGNOSIS — R109 Unspecified abdominal pain: Secondary | ICD-10-CM | POA: Diagnosis not present

## 2023-01-14 DIAGNOSIS — K219 Gastro-esophageal reflux disease without esophagitis: Secondary | ICD-10-CM | POA: Diagnosis not present

## 2023-01-14 DIAGNOSIS — R140 Abdominal distension (gaseous): Secondary | ICD-10-CM | POA: Diagnosis not present

## 2023-01-14 DIAGNOSIS — M545 Low back pain, unspecified: Secondary | ICD-10-CM | POA: Diagnosis not present

## 2023-01-14 DIAGNOSIS — I2510 Atherosclerotic heart disease of native coronary artery without angina pectoris: Secondary | ICD-10-CM | POA: Insufficient documentation

## 2023-01-14 DIAGNOSIS — M546 Pain in thoracic spine: Secondary | ICD-10-CM | POA: Diagnosis not present

## 2023-01-14 DIAGNOSIS — R1011 Right upper quadrant pain: Secondary | ICD-10-CM | POA: Diagnosis not present

## 2023-01-14 DIAGNOSIS — I10 Essential (primary) hypertension: Secondary | ICD-10-CM | POA: Diagnosis not present

## 2023-01-14 DIAGNOSIS — Z79899 Other long term (current) drug therapy: Secondary | ICD-10-CM | POA: Insufficient documentation

## 2023-01-14 DIAGNOSIS — N200 Calculus of kidney: Secondary | ICD-10-CM | POA: Diagnosis not present

## 2023-01-14 LAB — CBC WITH DIFFERENTIAL/PLATELET
Abs Immature Granulocytes: 0.01 10*3/uL (ref 0.00–0.07)
Basophils Absolute: 0.1 10*3/uL (ref 0.0–0.1)
Basophils Relative: 2 %
Eosinophils Absolute: 0.2 10*3/uL (ref 0.0–0.5)
Eosinophils Relative: 3 %
HCT: 40.2 % (ref 36.0–46.0)
Hemoglobin: 13 g/dL (ref 12.0–15.0)
Immature Granulocytes: 0 %
Lymphocytes Relative: 25 %
Lymphs Abs: 1.3 10*3/uL (ref 0.7–4.0)
MCH: 29.2 pg (ref 26.0–34.0)
MCHC: 32.3 g/dL (ref 30.0–36.0)
MCV: 90.3 fL (ref 80.0–100.0)
Monocytes Absolute: 0.6 10*3/uL (ref 0.1–1.0)
Monocytes Relative: 11 %
Neutro Abs: 3.3 10*3/uL (ref 1.7–7.7)
Neutrophils Relative %: 59 %
Platelets: 244 10*3/uL (ref 150–400)
RBC: 4.45 MIL/uL (ref 3.87–5.11)
RDW: 15.2 % (ref 11.5–15.5)
WBC: 5.4 10*3/uL (ref 4.0–10.5)
nRBC: 0 % (ref 0.0–0.2)

## 2023-01-14 LAB — COMPREHENSIVE METABOLIC PANEL
ALT: 19 U/L (ref 0–44)
AST: 24 U/L (ref 15–41)
Albumin: 4.4 g/dL (ref 3.5–5.0)
Alkaline Phosphatase: 52 U/L (ref 38–126)
Anion gap: 12 (ref 5–15)
BUN: 31 mg/dL — ABNORMAL HIGH (ref 8–23)
CO2: 26 mmol/L (ref 22–32)
Calcium: 10.1 mg/dL (ref 8.9–10.3)
Chloride: 99 mmol/L (ref 98–111)
Creatinine, Ser: 1.96 mg/dL — ABNORMAL HIGH (ref 0.44–1.00)
GFR, Estimated: 25 mL/min — ABNORMAL LOW (ref >60)
Glucose, Bld: 100 mg/dL — ABNORMAL HIGH (ref 70–99)
Potassium: 4.2 mmol/L (ref 3.5–5.1)
Sodium: 137 mmol/L (ref 135–145)
Total Bilirubin: 0.8 mg/dL (ref 0.3–1.2)
Total Protein: 6.8 g/dL (ref 6.5–8.1)

## 2023-01-14 LAB — LIPASE, BLOOD: Lipase: 59 U/L — ABNORMAL HIGH (ref 11–51)

## 2023-01-14 LAB — URINALYSIS, ROUTINE W REFLEX MICROSCOPIC
Bilirubin Urine: NEGATIVE
Glucose, UA: NEGATIVE mg/dL
Hgb urine dipstick: NEGATIVE
Ketones, ur: NEGATIVE mg/dL
Leukocytes,Ua: NEGATIVE
Nitrite: NEGATIVE
Protein, ur: NEGATIVE mg/dL
Specific Gravity, Urine: 1.006 (ref 1.005–1.030)
pH: 8 (ref 5.0–8.0)

## 2023-01-14 LAB — D-DIMER, QUANTITATIVE: D-Dimer, Quant: 0.29 ug{FEU}/mL (ref 0.00–0.50)

## 2023-01-14 LAB — BLOOD GAS, ARTERIAL
Acid-Base Excess: 7.1 mmol/L — ABNORMAL HIGH (ref 0.0–2.0)
Bicarbonate: 32 mmol/L — ABNORMAL HIGH (ref 20.0–28.0)
Drawn by: 41977
O2 Saturation: 99.9 %
Patient temperature: 37
pCO2 arterial: 45 mmHg (ref 32–48)
pH, Arterial: 7.46 — ABNORMAL HIGH (ref 7.35–7.45)
pO2, Arterial: 125 mmHg — ABNORMAL HIGH (ref 83–108)

## 2023-01-14 LAB — TROPONIN I (HIGH SENSITIVITY): Troponin I (High Sensitivity): 17 ng/L (ref <18)

## 2023-01-14 MED ORDER — SODIUM CHLORIDE 0.9 % IV BOLUS
500.0000 mL | Freq: Once | INTRAVENOUS | Status: AC
  Administered 2023-01-14: 500 mL via INTRAVENOUS

## 2023-01-14 MED ORDER — LORAZEPAM 2 MG/ML IJ SOLN
0.5000 mg | Freq: Once | INTRAMUSCULAR | Status: AC
  Administered 2023-01-14: 0.5 mg via INTRAVENOUS
  Filled 2023-01-14: qty 1

## 2023-01-14 MED ORDER — LIDOCAINE 5 % EX PTCH
1.0000 | MEDICATED_PATCH | CUTANEOUS | Status: DC
  Administered 2023-01-14: 1 via TRANSDERMAL
  Filled 2023-01-14: qty 1

## 2023-01-14 MED ORDER — LIDOCAINE 5 % EX PTCH: 1.0000 | MEDICATED_PATCH | CUTANEOUS | 0 refills | Status: DC

## 2023-01-14 MED ORDER — MORPHINE SULFATE (PF) 4 MG/ML IV SOLN
4.0000 mg | Freq: Once | INTRAVENOUS | Status: AC
  Administered 2023-01-14: 4 mg via INTRAVENOUS
  Filled 2023-01-14: qty 1

## 2023-01-14 MED ORDER — ONDANSETRON HCL 4 MG/2ML IJ SOLN
4.0000 mg | Freq: Once | INTRAMUSCULAR | Status: AC
  Administered 2023-01-14: 4 mg via INTRAVENOUS
  Filled 2023-01-14: qty 2

## 2023-01-14 NOTE — ED Notes (Signed)
Patient aware of needing urine

## 2023-01-14 NOTE — ED Triage Notes (Signed)
BIBA from grocery store with generalized abd pain and right flank pain.  Swelling to right side of abd per patient  Denies n/v/d. Denies urinary s/sy Unk last BM

## 2023-01-14 NOTE — Discharge Instructions (Addendum)
Your lab tests and ultrasound today are reassuring.  You have been prescribed a pain patch for your back - this might help you with your symptoms.  If this helps,  there is a prescription for more at your pharmacy.  I have also given you a referral to our general surgeon Dr. Lovell Sheehan.  He can help address the concern that your breast implant may have ruptured, if so, this may need to be removed but is not on an emergent basis.

## 2023-01-14 NOTE — ED Provider Notes (Signed)
[place] EMERGENCY DEPARTMENT AT [facility] Provider Note   CSN: [id number] Arrival date & time: 01/14/23  1142     History  Chief Complaint  Patient presents with   Abdominal Pain    [name] is a 87 y.o. female with a medical history significant for hypertension, diverticulosis, GERD, CAD, asthma and history of CVA, surgical history significant for abdominal hysterectomy presenting for evaluation of right flank and right upper abdominal pain.  She was grocery shopping this morning when she developed fairly sudden onset of pain in her right upper quadrant and into her right flank and noticed swelling at the right side of her abdomen when her pain was severe.  It is currently improved but not resolved.  She denies nausea, vomiting, diarrhea, fevers or chills.  She has had no urinary symptoms, denies history of kidney stones, no hematuria.  She does have chronic back pain and feels like her back pain is exacerbated as well.  She denies any injuries or falls.  She has had no treatment prior to arrival.  She denies weakness or numbness in her extremities.  She has had no medications prior to arrival.  Pain is waxing and waning, not positional.  Of note, patient had similar pain early August which resolved spontaneously until now, at that time she was seen in the ED and had a CT angio chest abdomen pelvis over concern for possible dissection, this test was negative for acute findings.  She was actually admitted overnight as her symptoms at that time were accompanied by bradycardia and dizziness which she does not have today.  Her metoprolol was adjusted during that admission.  The history is provided by the patient.       Home Medications Prior to Admission medications   Medication Sig Start Date End Date Taking? Authorizing Provider  lidocaine (LIDODERM) 5 % Place 1 patch onto the skin daily. Remove & Discard patch within 12 hours or as directed by MD 01/14/23  Yes  Ronan Dion, Raynelle Fanning, PA-C  albuterol (VENTOLIN HFA) 108 (90 Base) MCG/ACT inhaler Inhale 2 puffs into the lungs every 6 (six) hours as needed for wheezing or shortness of breath. 01/02/23   Shon Hale, MD  amLODipine (NORVASC) 10 MG tablet Take 1 tablet (10 mg total) by mouth daily. For BP 01/02/23 01/02/24  Shon Hale, MD  apixaban (ELIQUIS) 2.5 MG TABS tablet Take 1 tablet (2.5 mg total) by mouth 2 (two) times daily. 01/02/23   Shon Hale, MD  Ascorbic Acid (VITAMIN C) 1000 MG tablet Take 1,000 mg by mouth daily.    [provider]  B Complex Vitamins (VITAMIN B COMPLEX) TABS Take 1 tablet by mouth daily.    [provider]  Calcium Carb-Cholecalciferol (CALCIUM 600 + D PO) Take 1 tablet by mouth in the morning and at bedtime.    [provider]  ezetimibe (ZETIA) 10 MG tablet Take 1 tablet (10 mg total) by mouth daily. 09/08/18   Sharee Holster, NP  Ferrous Gluconate (IRON) 240 (27 Fe) MG TABS Take 1 tablet by mouth daily. 09/08/18   Sharee Holster, NP  flecainide (TAMBOCOR) 50 MG tablet TAKE 1 TABLET(50 MG) BY MOUTH TWICE DAILY 02/11/22   Marinus Maw, MD  furosemide (LASIX) 40 MG tablet Take 1 tablet (40 mg total) by mouth daily. 01/03/23   Shon Hale, MD  levothyroxine (SYNTHROID) 75 MCG tablet Take 75 mcg by mouth daily before breakfast.    [provider]  losartan (COZAAR) 25 MG tablet Take 25 mg by mouth daily.    [provider]  Magnesium 250 MG TABS Take 250 mg by mouth daily.     [provider]  memantine (NAMENDA) 10 MG tablet Take 1 tablet (10 mg total) by mouth 2 (two) times daily. 12/09/22   Glean Salvo, NP  Omega-3 Fatty Acids (FISH OIL) 1200 MG CAPS Take 1,200 mg by mouth daily.    [provider]  pantoprazole (PROTONIX) 40 MG tablet Take 1 tablet (40 mg total) by mouth daily. 01/02/23   Shon Hale, MD  Potassium Chloride ER 20 MEQ TBCR Take 1 tablet (20 mEq total) by mouth daily. 1 tab daily by  mouth--- take while taking Lasix/furosemide 01/02/23   Emokpae, Courage, MD  senna-docusate (SENOKOT-S) 8.6-50 MG tablet Take 2 tablets by mouth at bedtime. 01/02/23   Shon Hale, MD  Vitamin D, Cholecalciferol, 25 MCG (1000 UT) TABS Take 1,000 Int'l Units/1.34m2 by mouth daily at 6 (six) AM.    [provider]  vitamin E 180 MG (400 UNITS) capsule Take 400 Units by mouth daily.    [provider]      Allergies    Epinephrine, Demerol [meperidine], and Vicodin [hydrocodone-acetaminophen]    Review of Systems   Review of Systems  Constitutional:  Negative for fever.  HENT:  Negative for congestion and sore throat.   Eyes: Negative.   Respiratory:  Negative for chest tightness and shortness of breath.   Cardiovascular:  Negative for chest pain.  Gastrointestinal:  Positive for abdominal pain. Negative for constipation, diarrhea, nausea and vomiting.  Genitourinary:  Positive for flank pain. Negative for dysuria and hematuria.  Musculoskeletal:  Negative for arthralgias, joint swelling and neck pain.  Skin: Negative.  Negative for rash and wound.  Neurological:  Negative for dizziness, weakness, light-headedness, numbness and headaches.  Psychiatric/Behavioral: Negative.      Physical Exam Updated Vital Signs BP 119/66   Pulse 64   Temp 98.5 F (36.9 C)   Resp 12   Wt 60 kg   SpO2 99%   BMI 25.83 kg/m  Physical Exam Vitals and nursing note reviewed.  Constitutional:      Appearance: She is well-developed.  HENT:     Head: Normocephalic and atraumatic.  Eyes:     Conjunctiva/sclera: Conjunctivae normal.  Cardiovascular:     Rate and Rhythm: Normal rate and regular rhythm.     Heart sounds: Normal heart sounds.  Pulmonary:     Effort: Pulmonary effort is normal. No respiratory distress.     Breath sounds: Normal breath sounds. No wheezing or rhonchi.  Abdominal:     General: Abdomen is flat. Bowel sounds are normal.     Palpations: Abdomen is soft.  There is no mass or pulsatile mass.     Tenderness: There is abdominal tenderness in the right upper quadrant. There is no guarding or rebound. Negative signs include Murphy's sign.     Hernia: No hernia is present.     Comments: Discomfort to palpation in the right upper abdomen, there is no guarding or rebound, negative Murphy's.  No CVA tenderness.  Musculoskeletal:        General: Normal range of motion.     Cervical back: Normal range of motion.  Skin:    General: Skin is warm and dry.  Neurological:     Mental Status: She is alert.     ED Results / Procedures / Treatments  Labs (all labs ordered are listed, but only abnormal results are displayed) Labs Reviewed  COMPREHENSIVE METABOLIC PANEL - Abnormal; Notable for the following components:      Result Value   Glucose, Bld 100 (*)    BUN 31 (*)    Creatinine, Ser 1.96 (*)    GFR, Estimated 25 (*)    All other components within normal limits  LIPASE, BLOOD - Abnormal; Notable for the following components:   Lipase 59 (*)    All other components within normal limits  BLOOD GAS, ARTERIAL - Abnormal; Notable for the following components:   pH, Arterial 7.46 (*)    pO2, Arterial 125 (*)    Bicarbonate 32.0 (*)    Acid-Base Excess 7.1 (*)    All other components within normal limits  CBC WITH DIFFERENTIAL/PLATELET  URINALYSIS, ROUTINE W REFLEX MICROSCOPIC  D-DIMER, QUANTITATIVE  TROPONIN I (HIGH SENSITIVITY)    EKG EKG Interpretation Date/Time:  Wednesday January 14 2023 11:59:37 EDT Ventricular Rate:  66 PR Interval:    QRS Duration:  187 QT Interval:  523 QTC Calculation: 549 R Axis:   181  Text Interpretation: Atrial fibrillation Nonspecific intraventricular conduction delay afib is new compared to before Confirmed by Derwood Kaplan [phone]) on 01/14/2023 2:09:40 PM  Radiology US Abdomen Limited RUQ (LIVER/GB)  Result Date: 01/14/2023 CLINICAL DATA:  151470 RUQ abdominal pain 151470 EXAM: ULTRASOUND ABDOMEN  LIMITED RIGHT UPPER QUADRANT COMPARISON:  CT scan angiography abdomen and pelvis from 01/01/2023. FINDINGS: Examination is markedly limited due to patient related factors. Gallbladder: No gallstones or wall thickening visualized. No sonographic Murphy sign noted by sonographer. There are at least 2-3 sessile gallbladder polyps with largest measuring up to 6.3 mm. No associated gallbladder wall thickening. Please see follow-up recommendations below. Common bile duct: Diameter: Less than 4 mm. Liver: Markedly limited evaluation. No focal lesion identified. Within normal limits in parenchymal echogenicity. Portal vein is patent on color Doppler imaging with normal direction of blood flow towards the liver. Other: None. IMPRESSION: 1. Markedly limited examination due to patient related factors. 2. No cholelithiasis or sonographic evidence of acute cholecystitis. 3. Multiple low risk sessile gallbladder polyps with largest measuring up to 6.3 mm. No further follow-up is recommended as per the guidelines described below. Management of Incidentally Detected Gallbladder Polyps: Society of Radiologists in Ultrasound Consensus Conference Recommendations (published online November 27, 2020): [email or web address] Extremely low-risk polyps, i.e. pedunculated ball-on-the-wall or thin stalk: <9 mm: no follow-up 10-14 mm: follow-up at 6, 12 and 24 months >15 mm: surgical consult Low-risk polyps, i.e. pedunculated with a thick or wide stalk, or sessile: ?6 mm: no follow-up 7-9 mm follow-up ultrasound at 12 months 10-14 mm: follow-up ultrasound at 6, 12, 24, and 36 months vs surgical consult >15 mm: surgical consult Intermediate risk: focal wall-thickening >44mm adjacent to polyp: <6 mm: follow-up at 6, 12, 24, 36 months vs surgical consult >7 mm: surgical consult Electronically Signed   By: Jules Schick M.D.   On: 01/14/2023 17:35   DG ABD ACUTE 2+V W 1V CHEST  Result Date: 01/14/2023 CLINICAL DATA:  abd pain,  distention. EXAM: DG ABDOMEN ACUTE WITH 1 VIEW CHEST COMPARISON:  None Available. FINDINGS: The bowel gas pattern is non-obstructive.  Small stool burden. No evidence of pneumoperitoneum, within the limitations of a supine film. No acute osseous abnormalities. Marked thoracolumbar spinal curvature noted. Bilateral lungs and bilateral lateral costophrenic angle are clear. Stable cardio mediastinal silhouette. Surgical changes, devices, tubes and lines: Redemonstration of calcification overlying  the left lower hemithorax, likely from breast implant. IMPRESSION: 1. Nonobstructive bowel gas pattern. Small stool burden. 2. No active cardiopulmonary disease. Electronically Signed   By: Jules Schick M.D.   On: 01/14/2023 15:22    Procedures Procedures    Medications Ordered in ED Medications  lidocaine (LIDODERM) 5 % 1 patch (1 patch Transdermal Patch Applied 01/14/23 1944)  ondansetron (ZOFRAN) injection 4 mg (4 mg Intravenous Given 01/14/23 1318)  morphine (PF) 4 MG/ML injection 4 mg (4 mg Intravenous Given 01/14/23 1318)  sodium chloride 0.9 % bolus 500 mL (0 mLs Intravenous Stopped 01/14/23 1920)  LORazepam (ATIVAN) injection 0.5 mg (0.5 mg Intravenous Given 01/14/23 1636)    ED Course/ Medical Decision Making/ A&P                                 Medical Decision Making Patient presenting with right upper abdominal pain and flank pain of unclear etiology, differential diagnosis is broad including acute cholecystitis, biliary colic, renal colic and/or ureteral stone.  This could also represent gas trapping at the hepatic flexure, constipation, diverticulitis, hernia as she noted swelling at the site when her pain first began today although there is no palpable hernia on today's exam.  Additionally during her ED visit she developed episodes of shortness of breath, fleeting, witnessed by me which was associated with hyperventilation and anxiety, these episodes would last 30 seconds to a minute.  She was  on the monitor during these events, there is no tachycardia, no hypoxia, visually it appeared to be hyperventilation secondary to panic and anxiety.  An ABG along with a D-dimer were ordered to rule out PE and hypoxia.  Results below, suspect possible anxiety component.  She was given a dose of Ativan after which her symptoms significantly improved and she felt stable for discharge home once all of her results were obtained.  Her workup today appears relatively stable, she was encouraged to increase fluid intake, advise close follow-up with her primary provider for recheck of her symptoms if not improving, return precautions were outlined.  She endorsed increased pain at the site of her prior kyphoplasty procedure, she was offered lidocaine patch and prescription for additional if she finds this helpful.  Before DC she expressed concern that she thinks her right breast implant has ruptured as it feels different than it should, had a problem with the left 1 in the past which had been removed.  She was given referral to general surgery for further evaluation of this concern.  Amount and/or Complexity of Data Reviewed Labs: ordered.    Details: Labs reviewed, normal urinalysis, ABG revealing a metabolic alkalosis suggesting hyperventilation, delta troponin is negative, doubt ACS, she has a negative D-dimer, she does have a mild bump in her lipase at 59, she has had this in the past, her c-Met is negative regarding her LFTs, she does appear to be somewhat dehydrated as her BUN is 31 and her creatinine is 1.96.  Her creatinine over the past month has ranged from 1.31-1.79, all with elevated BUN's.  She was given IV fluids while here. Radiology: ordered.    Details: Given recent CT angio chest abdomen pelvis, in no acute findings on exam but with persistently elevated bilirubin, a right upper quadrant ultrasound was obtained to rule out gallstones or acute cholecystitis, this test was negative. ECG/medicine  tests: ordered and independent interpretation performed.    Details: Rate controlled A-fib, vent  rate 66.  Risk Prescription drug management.           Final Clinical Impression(s) / ED Diagnoses Final diagnoses:  Right upper quadrant abdominal pain  Chronic right-sided low back pain without sciatica    Rx / DC Orders ED Discharge Orders          Ordered    lidocaine (LIDODERM) 5 %  Every 24 hours        01/14/23 1939              Victoriano Lain 01/17/23 1227    Eber Hong, MD 01/17/23 [phone]

## 2023-01-14 NOTE — ED Notes (Signed)
Patient breathing fast and holding breath while PA in room. PA added 2 lpm  due to low spo2.

## 2023-01-19 DIAGNOSIS — I480 Paroxysmal atrial fibrillation: Secondary | ICD-10-CM | POA: Diagnosis not present

## 2023-01-19 DIAGNOSIS — R1011 Right upper quadrant pain: Secondary | ICD-10-CM | POA: Diagnosis not present

## 2023-01-19 DIAGNOSIS — N1831 Chronic kidney disease, stage 3a: Secondary | ICD-10-CM | POA: Diagnosis not present

## 2023-01-19 DIAGNOSIS — K219 Gastro-esophageal reflux disease without esophagitis: Secondary | ICD-10-CM | POA: Diagnosis not present

## 2023-01-19 DIAGNOSIS — F0394 Unspecified dementia, unspecified severity, with anxiety: Secondary | ICD-10-CM | POA: Diagnosis not present

## 2023-01-19 DIAGNOSIS — F0393 Unspecified dementia, unspecified severity, with mood disturbance: Secondary | ICD-10-CM | POA: Diagnosis not present

## 2023-01-19 DIAGNOSIS — I129 Hypertensive chronic kidney disease with stage 1 through stage 4 chronic kidney disease, or unspecified chronic kidney disease: Secondary | ICD-10-CM | POA: Diagnosis not present

## 2023-01-19 DIAGNOSIS — R109 Unspecified abdominal pain: Secondary | ICD-10-CM | POA: Diagnosis not present

## 2023-01-19 DIAGNOSIS — F331 Major depressive disorder, recurrent, moderate: Secondary | ICD-10-CM | POA: Diagnosis not present

## 2023-01-19 DIAGNOSIS — F03B3 Unspecified dementia, moderate, with mood disturbance: Secondary | ICD-10-CM | POA: Diagnosis not present

## 2023-01-19 DIAGNOSIS — R600 Localized edema: Secondary | ICD-10-CM | POA: Diagnosis not present

## 2023-01-19 DIAGNOSIS — M545 Low back pain, unspecified: Secondary | ICD-10-CM | POA: Diagnosis not present

## 2023-01-23 DIAGNOSIS — R799 Abnormal finding of blood chemistry, unspecified: Secondary | ICD-10-CM | POA: Diagnosis not present

## 2023-01-25 ENCOUNTER — Emergency Department (HOSPITAL_COMMUNITY): Payer: PPO

## 2023-01-25 ENCOUNTER — Encounter (HOSPITAL_COMMUNITY): Payer: Self-pay

## 2023-01-25 ENCOUNTER — Inpatient Hospital Stay (HOSPITAL_COMMUNITY)
Admission: EM | Admit: 2023-01-25 | Discharge: 2023-02-03 | DRG: 982 | Disposition: A | Discharge status: Skilled Nursing Facility | Payer: PPO | Attending: Internal Medicine | Admitting: Internal Medicine

## 2023-01-25 DIAGNOSIS — K5900 Constipation, unspecified: Secondary | ICD-10-CM | POA: Diagnosis present

## 2023-01-25 DIAGNOSIS — M25551 Pain in right hip: Secondary | ICD-10-CM | POA: Diagnosis present

## 2023-01-25 DIAGNOSIS — N179 Acute kidney failure, unspecified: Secondary | ICD-10-CM | POA: Diagnosis present

## 2023-01-25 DIAGNOSIS — I4891 Unspecified atrial fibrillation: Secondary | ICD-10-CM | POA: Diagnosis not present

## 2023-01-25 DIAGNOSIS — R1084 Generalized abdominal pain: Secondary | ICD-10-CM | POA: Diagnosis not present

## 2023-01-25 DIAGNOSIS — R21 Rash and other nonspecific skin eruption: Secondary | ICD-10-CM | POA: Diagnosis present

## 2023-01-25 DIAGNOSIS — Z8673 Personal history of transient ischemic attack (TIA), and cerebral infarction without residual deficits: Secondary | ICD-10-CM

## 2023-01-25 DIAGNOSIS — R000 Tachycardia, unspecified: Secondary | ICD-10-CM | POA: Diagnosis not present

## 2023-01-25 DIAGNOSIS — Z9882 Breast implant status: Secondary | ICD-10-CM

## 2023-01-25 DIAGNOSIS — I495 Sick sinus syndrome: Secondary | ICD-10-CM | POA: Diagnosis present

## 2023-01-25 DIAGNOSIS — I130 Hypertensive heart and chronic kidney disease with heart failure and stage 1 through stage 4 chronic kidney disease, or unspecified chronic kidney disease: Secondary | ICD-10-CM | POA: Diagnosis present

## 2023-01-25 DIAGNOSIS — I440 Atrioventricular block, first degree: Secondary | ICD-10-CM | POA: Diagnosis present

## 2023-01-25 DIAGNOSIS — R001 Bradycardia, unspecified: Secondary | ICD-10-CM | POA: Diagnosis not present

## 2023-01-25 DIAGNOSIS — R270 Ataxia, unspecified: Secondary | ICD-10-CM | POA: Diagnosis not present

## 2023-01-25 DIAGNOSIS — K219 Gastro-esophageal reflux disease without esophagitis: Secondary | ICD-10-CM | POA: Diagnosis present

## 2023-01-25 DIAGNOSIS — Z8719 Personal history of other diseases of the digestive system: Secondary | ICD-10-CM

## 2023-01-25 DIAGNOSIS — M40209 Unspecified kyphosis, site unspecified: Secondary | ICD-10-CM | POA: Diagnosis present

## 2023-01-25 DIAGNOSIS — D649 Anemia, unspecified: Secondary | ICD-10-CM | POA: Diagnosis not present

## 2023-01-25 DIAGNOSIS — K8020 Calculus of gallbladder without cholecystitis without obstruction: Secondary | ICD-10-CM | POA: Diagnosis not present

## 2023-01-25 DIAGNOSIS — Z885 Allergy status to narcotic agent status: Secondary | ICD-10-CM

## 2023-01-25 DIAGNOSIS — R1011 Right upper quadrant pain: Secondary | ICD-10-CM | POA: Diagnosis not present

## 2023-01-25 DIAGNOSIS — K810 Acute cholecystitis: Secondary | ICD-10-CM | POA: Diagnosis not present

## 2023-01-25 DIAGNOSIS — R109 Unspecified abdominal pain: Principal | ICD-10-CM

## 2023-01-25 DIAGNOSIS — J45901 Unspecified asthma with (acute) exacerbation: Secondary | ICD-10-CM | POA: Diagnosis not present

## 2023-01-25 DIAGNOSIS — R079 Chest pain, unspecified: Secondary | ICD-10-CM | POA: Diagnosis not present

## 2023-01-25 DIAGNOSIS — Z6831 Body mass index (BMI) 31.0-31.9, adult: Secondary | ICD-10-CM

## 2023-01-25 DIAGNOSIS — M549 Dorsalgia, unspecified: Secondary | ICD-10-CM | POA: Insufficient documentation

## 2023-01-25 DIAGNOSIS — B029 Zoster without complications: Secondary | ICD-10-CM | POA: Diagnosis present

## 2023-01-25 DIAGNOSIS — Z7901 Long term (current) use of anticoagulants: Secondary | ICD-10-CM

## 2023-01-25 DIAGNOSIS — R4189 Other symptoms and signs involving cognitive functions and awareness: Secondary | ICD-10-CM | POA: Diagnosis present

## 2023-01-25 DIAGNOSIS — N200 Calculus of kidney: Secondary | ICD-10-CM | POA: Diagnosis present

## 2023-01-25 DIAGNOSIS — I2510 Atherosclerotic heart disease of native coronary artery without angina pectoris: Secondary | ICD-10-CM | POA: Diagnosis present

## 2023-01-25 DIAGNOSIS — Z87442 Personal history of urinary calculi: Secondary | ICD-10-CM

## 2023-01-25 DIAGNOSIS — Z800 Family history of malignant neoplasm of digestive organs: Secondary | ICD-10-CM

## 2023-01-25 DIAGNOSIS — I700 Atherosclerosis of aorta: Secondary | ICD-10-CM | POA: Diagnosis not present

## 2023-01-25 DIAGNOSIS — I480 Paroxysmal atrial fibrillation: Secondary | ICD-10-CM | POA: Diagnosis present

## 2023-01-25 DIAGNOSIS — Z90710 Acquired absence of both cervix and uterus: Secondary | ICD-10-CM

## 2023-01-25 DIAGNOSIS — N184 Chronic kidney disease, stage 4 (severe): Secondary | ICD-10-CM | POA: Diagnosis present

## 2023-01-25 DIAGNOSIS — R413 Other amnesia: Secondary | ICD-10-CM | POA: Diagnosis present

## 2023-01-25 DIAGNOSIS — Z5320 Procedure and treatment not carried out because of patient's decision for unspecified reasons: Secondary | ICD-10-CM | POA: Diagnosis present

## 2023-01-25 DIAGNOSIS — D631 Anemia in chronic kidney disease: Secondary | ICD-10-CM | POA: Diagnosis present

## 2023-01-25 DIAGNOSIS — I482 Chronic atrial fibrillation, unspecified: Secondary | ICD-10-CM | POA: Diagnosis present

## 2023-01-25 DIAGNOSIS — E039 Hypothyroidism, unspecified: Secondary | ICD-10-CM | POA: Diagnosis present

## 2023-01-25 DIAGNOSIS — Z79890 Hormone replacement therapy (postmenopausal): Secondary | ICD-10-CM

## 2023-01-25 DIAGNOSIS — E785 Hyperlipidemia, unspecified: Secondary | ICD-10-CM | POA: Diagnosis present

## 2023-01-25 DIAGNOSIS — I5032 Chronic diastolic (congestive) heart failure: Secondary | ICD-10-CM | POA: Diagnosis present

## 2023-01-25 DIAGNOSIS — I959 Hypotension, unspecified: Secondary | ICD-10-CM | POA: Diagnosis not present

## 2023-01-25 DIAGNOSIS — Z8042 Family history of malignant neoplasm of prostate: Secondary | ICD-10-CM

## 2023-01-25 DIAGNOSIS — R748 Abnormal levels of other serum enzymes: Secondary | ICD-10-CM | POA: Diagnosis present

## 2023-01-25 DIAGNOSIS — R064 Hyperventilation: Secondary | ICD-10-CM | POA: Diagnosis not present

## 2023-01-25 DIAGNOSIS — Z888 Allergy status to other drugs, medicaments and biological substances status: Secondary | ICD-10-CM

## 2023-01-25 DIAGNOSIS — Z79899 Other long term (current) drug therapy: Secondary | ICD-10-CM

## 2023-01-25 DIAGNOSIS — G8929 Other chronic pain: Secondary | ICD-10-CM | POA: Diagnosis present

## 2023-01-25 MED ORDER — MORPHINE SULFATE (PF) 4 MG/ML IV SOLN
4.0000 mg | Freq: Once | INTRAVENOUS | Status: AC
  Administered 2023-01-25: 4 mg via INTRAVENOUS
  Filled 2023-01-25: qty 1

## 2023-01-25 MED ORDER — ONDANSETRON HCL 4 MG/2ML IJ SOLN
4.0000 mg | Freq: Once | INTRAMUSCULAR | Status: AC
  Administered 2023-01-25: 4 mg via INTRAVENOUS
  Filled 2023-01-25: qty 2

## 2023-01-25 NOTE — ED Triage Notes (Signed)
Pt coming in with generalized abd pain, transported by medic, medic reports she was in afib nut did have brief periods of bradycardia, pt presented with a bradycardic rhythm, and was hunched over with abd pain, she is hard of hearing as well.

## 2023-01-25 NOTE — ED Provider Notes (Signed)
[facility]-EMERGENCY DEPT [facility] Emergency Department Provider Note MRN:  [id number]  Arrival date & time: 01/26/23     Chief Complaint   Abdominal pain  History of Present Illness   [name] is a 87 y.o. year-old female with a history of hypertension, stroke, CAD presenting to the ED with chief complaint of abdominal pain.  Report of abdominal pain and/or shortness of breath at home.  Found to be bradycardic with an episode of reported A-fib with a rate of 120.  Pain goes from under her right breast to her right lower quadrant down the back of her leg.  Review of Systems  A thorough review of systems was obtained and all systems are negative except as noted in the HPI and PMH.   Patient's Health History    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76   age 4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 2 weeks   Hemorrhoids 2007   History of kidney stones    HTN (hypertension)    Hyperplastic colon polyp 2005   Hypothyroidism    Narcolepsy    PONV (postoperative nausea and vomiting)    TIA (transient ischemic attack)     Past Surgical History:  Procedure Laterality Date   ABDOMINAL HYSTERECTOMY     age 37, complicated by poor wound healing, followed by revision of scar and radiation for ?malignancy   BACK SURGERY  07/2018   lost 2 inches   BREAST ENHANCEMENT SURGERY     BREAST IMPLANT EXCHANGE Right 06/23/2016   Procedure: RIGHT BREAST IMPLANT REMOVAL AND REPLACEMENT;  Surgeon: Louisa Second, MD;  Location: [facility];  Service: Plastics;  Laterality: Right;   COLONOSCOPY  2005   2  hyperplastic polyps   COLONOSCOPY  2007   Dr. Darrick Penna- hemorrhoids   COLONOSCOPY  2008   Dr. Steva Ready polyp- rare sigmoid diverticulosis, internal hemorrhoids   COLONOSCOPY  12/01/2011   Procedure: COLONOSCOPY;  Surgeon: West Bali, MD;  [facility];  Service: Endoscopy;  Laterality: N/A;  12:30 PM   COLONOSCOPY WITH PROPOFOL N/A 05/22/2022   Procedure: COLONOSCOPY WITH PROPOFOL;  Surgeon: Lanelle Bal, DO;  [facility];  Service: Endoscopy;  Laterality: N/A;  1:00 PM   CYSTOSCOPY/URETEROSCOPY/HOLMIUM LASER/STENT PLACEMENT Left 02/29/2020   Procedure: CYSTOSCOPY/URETEROSCOPY/HOLMIUM LASER/STENT PLACEMENT;  Surgeon: Rene Paci, MD;  Location: [facility];  Service: Urology;  Laterality: Left;   epidural steroid injection     She is getting injections with Dr. Ethelene Hal q3wks.   ESOPHAGOGASTRODUODENOSCOPY (EGD) WITH ESOPHAGEAL DILATION N/A 04/15/2013   Procedure: ESOPHAGOGASTRODUODENOSCOPY (EGD) WITH ESOPHAGEAL DILATION;  Surgeon: West Bali, MD;  [facility];  Service: Endoscopy;  Laterality: N/A;  11:45-moved to 12:30 [name] to notify pt   EYE SURGERY  2010   EYE SURGERY     IR KYPHO LUMBAR INC FX REDUCE BONE BX UNI/BIL CANNULATION INC/IMAGING  08/23/2018   IR RADIOLOGIST EVAL & MGMT  12/23/2018   POLYPECTOMY  05/22/2022   Procedure: POLYPECTOMY;  Surgeon: Lanelle Bal, DO;  [facility];  Service: Endoscopy;;   right thumb surgery      Family History  Problem Relation Age of Onset   Colon cancer Father        age 69   Prostate cancer Father    Pancreatic cancer Mother        age 53    Social History   Socioeconomic History   Marital status: Widowed    Spouse name: Not on file   Number of children: 1   Years of education: 76   Highest education level: 12th grade  Occupational History   Not on file  Tobacco Use   Smoking status: Never    Passive exposure: Never   Smokeless tobacco: Never  Vaping  Use   Vaping status: Never Used  Substance and Sexual Activity   Alcohol use: No   Drug use: No   Sexual activity: Not Currently    Partners: Male  Other Topics Concern   Not on file  Social History Narrative   Lives alone   Right Handed    Drinks no caffeine daily   Social Determinants of Health   Financial Resource Strain: Low Risk  (02/06/2022)   Overall Financial Resource Strain (CARDIA)    Difficulty of Paying Living Expenses: Not hard at all  Food Insecurity: No Food Insecurity (01/01/2023)   Hunger Vital Sign    Worried About Running Out of Food in the Last Year: Never true    Ran Out of Food in the Last Year: Never true  Transportation Needs: No Transportation Needs (01/01/2023)   PRAPARE - Administrator, Civil Service (Medical): No    Lack of Transportation (Non-Medical): No  Physical Activity: Inactive (02/06/2022)   Exercise Vital Sign    Days of Exercise per Week: 0 days    Minutes of Exercise per Session: 0 min  Stress: No Stress Concern Present (02/06/2022)   [employer] of Occupational Health - Occupational Stress Questionnaire    Feeling of Stress : Only a little  Social Connections: Moderately Integrated (02/06/2022)   Social Connection and Isolation Panel [NHANES]    Frequency of Communication with Friends and Family: More than three times a week    Frequency of Social Gatherings with Friends and Family: More than three times a week    Attends Religious Services: More than 4 times per year    Active Member of [employer] or Organizations: Yes    Attends Banker Meetings: More than 4 times per year    Marital Status: Widowed  Intimate Partner Violence: Not At Risk (01/01/2023)   Humiliation, Afraid, Rape, and Kick questionnaire    Fear of Current or Ex-Partner: No    Emotionally Abused: No    Physically Abused: No    Sexually Abused: No     Physical Exam   Vitals:   01/25/23 2345 01/26/23 0000  BP: 120/61 114/62  Pulse: (!) 57 (!)  55  Resp: (!) 26 13  Temp:    SpO2: 100% 100%    CONSTITUTIONAL: Chronically ill-appearing, moderate discomfort due to pain NEURO/PSYCH:  Alert and oriented x 3, no focal deficits, hard of hearing EYES:  eyes equal and reactive ENT/NECK:  no LAD, no JVD CARDIO: Bradycardic rate, well-perfused, normal S1 and S2 PULM:  CTAB no wheezing or rhonchi GI/GU:  non-distended, non-tender MSK/SPINE:  No gross deformities, no edema SKIN:  no rash,  atraumatic   *Additional and/or pertinent findings included in MDM below  Diagnostic and Interventional Summary    EKG Interpretation Date/Time:  Sunday January 25 2023 23:15:39 EDT Ventricular Rate:  51 PR Interval:  268 QRS Duration:  119 QT Interval:  519 QTC Calculation: 478 R Axis:   -64  Text Interpretation: Sinus rhythm Prolonged PR interval Left anterior fascicular block Low voltage, precordial leads Consider anterior infarct Confirmed by Kennis Carina [phone]) on 01/25/2023 11:27:24 PM       Labs Reviewed  CBC - Abnormal; Notable for the following components:      Result Value   RBC 2.73 (*)    Hemoglobin 8.1 (*)    HCT 26.2 (*)    RDW 16.1 (*)    Platelets 145 (*)    All other components within normal limits  COMPREHENSIVE METABOLIC PANEL - Abnormal; Notable for the following components:   Glucose, Bld 104 (*)    BUN 41 (*)    Creatinine, Ser 2.53 (*)    Total Protein 5.8 (*)    AST 57 (*)    GFR, Estimated 18 (*)    All other components within normal limits  LIPASE, BLOOD - Abnormal; Notable for the following components:   Lipase 92 (*)    All other components within normal limits  MAGNESIUM - Abnormal; Notable for the following components:   Magnesium 2.6 (*)    All other components within normal limits  URINALYSIS, ROUTINE W REFLEX MICROSCOPIC  PROTIME-INR  POC OCCULT BLOOD, ED  ABO/RH  TYPE AND SCREEN  TROPONIN I (HIGH SENSITIVITY)  TROPONIN I (HIGH SENSITIVITY)    CT ABDOMEN PELVIS WO CONTRAST  Final  Result    CT L-SPINE NO CHARGE  Final Result    DG Chest Port 1 View  Final Result      Medications  sodium chloride 0.9 % bolus 500 mL (has no administration in time range)  morphine (PF) 4 MG/ML injection 4 mg (4 mg Intravenous Given 01/25/23 2320)  ondansetron (ZOFRAN) injection 4 mg (4 mg Intravenous Given 01/25/23 2337)     Procedures  /  Critical Care Procedures  ED Course and Medical Decision Making  Initial Impression and Ddx Patient appears quite uncomfortable complaining of right-sided pain.  Given the distribution of the pain the differential is broad.  PE, dissection, pneumothorax, cholecystitis, kidney stone, MSK.  Does not seem to have any focal neurological deficits.  Pain seemed somewhat distractible initially.  Per chart review she had a very similar presentation with regard to the pain back in early August.  Had a CT dissection study at the time that was largely unremarkable, noted to have a kidney stone in the renal pelvis.  Patient was also admitted in early August during the same presentation with symptomatic bradycardia and her metoprolol was stopped.  She is bradycardic on arrival in the 30s improved to the 50s with better pain control.  Reportedly had an episode of tachycardia with EMS.  Past medical/surgical history that increases complexity of ED encounter: Stroke  Interpretation of Diagnostics I personally reviewed the EKG and my interpretation is as follows: Sinus bradycardia  Labs reveal hemoglobin of 8 down from 13, also worsening acute kidney injury.  Patient Reassessment and Ultimate Disposition/Management     Unclear cause of patient's new anemia compared to 2 weeks ago.  Hemoccult is negative.  Will admit to medicine for further care.  Patient is much more comfortable upon reassessment, sitting comfortably with normal vital signs.  Patient management required discussion with the following services or consulting groups:  Hospitalist Service  Complexity  of Problems Addressed Acute illness or injury that poses threat of life of bodily function  Additional Data Reviewed and Analyzed Further history obtained from: Further history from spouse/family member  Additional Factors Impacting ED Encounter Risk Consideration of hospitalization  Elmer Sow. Pilar Plate, MD [facility] Emergency Medicine [facility] [email or web address]  Final Clinical Impressions(s) / ED Diagnoses     ICD-10-CM   1. Abdominal pain, unspecified abdominal location  R10.9     2. Anemia, unspecified type  D64.9     3. AKI (acute kidney injury) (HCC)  N17.9       ED Discharge Orders     None        Discharge Instructions Discussed with and Provided to Patient:   Discharge Instructions   None      Sabas Sous, MD 01/26/23 [phone]

## 2023-01-26 ENCOUNTER — Encounter (HOSPITAL_COMMUNITY): Payer: Self-pay | Admitting: Internal Medicine

## 2023-01-26 ENCOUNTER — Emergency Department (HOSPITAL_COMMUNITY): Payer: PPO

## 2023-01-26 ENCOUNTER — Other Ambulatory Visit: Payer: Self-pay

## 2023-01-26 DIAGNOSIS — I6523 Occlusion and stenosis of bilateral carotid arteries: Secondary | ICD-10-CM | POA: Diagnosis not present

## 2023-01-26 DIAGNOSIS — K8020 Calculus of gallbladder without cholecystitis without obstruction: Secondary | ICD-10-CM | POA: Diagnosis not present

## 2023-01-26 DIAGNOSIS — K828 Other specified diseases of gallbladder: Secondary | ICD-10-CM | POA: Diagnosis not present

## 2023-01-26 DIAGNOSIS — I480 Paroxysmal atrial fibrillation: Secondary | ICD-10-CM | POA: Diagnosis not present

## 2023-01-26 DIAGNOSIS — D649 Anemia, unspecified: Secondary | ICD-10-CM

## 2023-01-26 DIAGNOSIS — I10 Essential (primary) hypertension: Secondary | ICD-10-CM | POA: Diagnosis not present

## 2023-01-26 DIAGNOSIS — N200 Calculus of kidney: Secondary | ICD-10-CM | POA: Diagnosis not present

## 2023-01-26 DIAGNOSIS — N1832 Chronic kidney disease, stage 3b: Secondary | ICD-10-CM | POA: Diagnosis not present

## 2023-01-26 DIAGNOSIS — N184 Chronic kidney disease, stage 4 (severe): Secondary | ICD-10-CM

## 2023-01-26 DIAGNOSIS — Z0389 Encounter for observation for other suspected diseases and conditions ruled out: Secondary | ICD-10-CM | POA: Diagnosis not present

## 2023-01-26 DIAGNOSIS — K7689 Other specified diseases of liver: Secondary | ICD-10-CM | POA: Diagnosis not present

## 2023-01-26 DIAGNOSIS — D631 Anemia in chronic kidney disease: Secondary | ICD-10-CM | POA: Diagnosis not present

## 2023-01-26 DIAGNOSIS — R001 Bradycardia, unspecified: Secondary | ICD-10-CM | POA: Diagnosis not present

## 2023-01-26 DIAGNOSIS — I495 Sick sinus syndrome: Secondary | ICD-10-CM | POA: Diagnosis not present

## 2023-01-26 DIAGNOSIS — E039 Hypothyroidism, unspecified: Secondary | ICD-10-CM

## 2023-01-26 DIAGNOSIS — R0602 Shortness of breath: Secondary | ICD-10-CM | POA: Diagnosis not present

## 2023-01-26 DIAGNOSIS — D320 Benign neoplasm of cerebral meninges: Secondary | ICD-10-CM | POA: Diagnosis not present

## 2023-01-26 DIAGNOSIS — J90 Pleural effusion, not elsewhere classified: Secondary | ICD-10-CM | POA: Diagnosis not present

## 2023-01-26 DIAGNOSIS — I482 Chronic atrial fibrillation, unspecified: Secondary | ICD-10-CM | POA: Diagnosis not present

## 2023-01-26 DIAGNOSIS — M6281 Muscle weakness (generalized): Secondary | ICD-10-CM | POA: Diagnosis not present

## 2023-01-26 DIAGNOSIS — R21 Rash and other nonspecific skin eruption: Secondary | ICD-10-CM | POA: Diagnosis not present

## 2023-01-26 DIAGNOSIS — Z7901 Long term (current) use of anticoagulants: Secondary | ICD-10-CM | POA: Diagnosis not present

## 2023-01-26 DIAGNOSIS — Z8673 Personal history of transient ischemic attack (TIA), and cerebral infarction without residual deficits: Secondary | ICD-10-CM | POA: Diagnosis not present

## 2023-01-26 DIAGNOSIS — R262 Difficulty in walking, not elsewhere classified: Secondary | ICD-10-CM | POA: Diagnosis not present

## 2023-01-26 DIAGNOSIS — N179 Acute kidney failure, unspecified: Secondary | ICD-10-CM

## 2023-01-26 DIAGNOSIS — F0150 Vascular dementia without behavioral disturbance: Secondary | ICD-10-CM | POA: Diagnosis not present

## 2023-01-26 DIAGNOSIS — G9389 Other specified disorders of brain: Secondary | ICD-10-CM | POA: Diagnosis not present

## 2023-01-26 DIAGNOSIS — B029 Zoster without complications: Secondary | ICD-10-CM | POA: Diagnosis not present

## 2023-01-26 DIAGNOSIS — K219 Gastro-esophageal reflux disease without esophagitis: Secondary | ICD-10-CM | POA: Diagnosis not present

## 2023-01-26 DIAGNOSIS — R918 Other nonspecific abnormal finding of lung field: Secondary | ICD-10-CM | POA: Diagnosis not present

## 2023-01-26 DIAGNOSIS — Z950 Presence of cardiac pacemaker: Secondary | ICD-10-CM | POA: Diagnosis not present

## 2023-01-26 DIAGNOSIS — I130 Hypertensive heart and chronic kidney disease with heart failure and stage 1 through stage 4 chronic kidney disease, or unspecified chronic kidney disease: Secondary | ICD-10-CM | POA: Diagnosis not present

## 2023-01-26 DIAGNOSIS — R4189 Other symptoms and signs involving cognitive functions and awareness: Secondary | ICD-10-CM | POA: Diagnosis not present

## 2023-01-26 DIAGNOSIS — I639 Cerebral infarction, unspecified: Secondary | ICD-10-CM | POA: Diagnosis not present

## 2023-01-26 DIAGNOSIS — K810 Acute cholecystitis: Secondary | ICD-10-CM

## 2023-01-26 DIAGNOSIS — Z79899 Other long term (current) drug therapy: Secondary | ICD-10-CM | POA: Diagnosis not present

## 2023-01-26 DIAGNOSIS — N189 Chronic kidney disease, unspecified: Secondary | ICD-10-CM | POA: Diagnosis not present

## 2023-01-26 DIAGNOSIS — J45901 Unspecified asthma with (acute) exacerbation: Secondary | ICD-10-CM | POA: Diagnosis not present

## 2023-01-26 DIAGNOSIS — R270 Ataxia, unspecified: Secondary | ICD-10-CM | POA: Diagnosis not present

## 2023-01-26 DIAGNOSIS — Z9882 Breast implant status: Secondary | ICD-10-CM | POA: Diagnosis not present

## 2023-01-26 DIAGNOSIS — F419 Anxiety disorder, unspecified: Secondary | ICD-10-CM | POA: Diagnosis not present

## 2023-01-26 DIAGNOSIS — N644 Mastodynia: Secondary | ICD-10-CM | POA: Diagnosis not present

## 2023-01-26 DIAGNOSIS — F329 Major depressive disorder, single episode, unspecified: Secondary | ICD-10-CM | POA: Diagnosis not present

## 2023-01-26 DIAGNOSIS — R413 Other amnesia: Secondary | ICD-10-CM

## 2023-01-26 DIAGNOSIS — I700 Atherosclerosis of aorta: Secondary | ICD-10-CM | POA: Diagnosis not present

## 2023-01-26 DIAGNOSIS — R0789 Other chest pain: Secondary | ICD-10-CM | POA: Diagnosis not present

## 2023-01-26 DIAGNOSIS — Z741 Need for assistance with personal care: Secondary | ICD-10-CM | POA: Diagnosis not present

## 2023-01-26 DIAGNOSIS — R1011 Right upper quadrant pain: Secondary | ICD-10-CM | POA: Diagnosis not present

## 2023-01-26 DIAGNOSIS — R29818 Other symptoms and signs involving the nervous system: Secondary | ICD-10-CM | POA: Diagnosis not present

## 2023-01-26 DIAGNOSIS — M545 Low back pain, unspecified: Secondary | ICD-10-CM | POA: Diagnosis not present

## 2023-01-26 DIAGNOSIS — Z79890 Hormone replacement therapy (postmenopausal): Secondary | ICD-10-CM | POA: Diagnosis not present

## 2023-01-26 DIAGNOSIS — K5900 Constipation, unspecified: Secondary | ICD-10-CM | POA: Diagnosis not present

## 2023-01-26 DIAGNOSIS — G8929 Other chronic pain: Secondary | ICD-10-CM | POA: Diagnosis not present

## 2023-01-26 DIAGNOSIS — M549 Dorsalgia, unspecified: Secondary | ICD-10-CM | POA: Insufficient documentation

## 2023-01-26 DIAGNOSIS — E785 Hyperlipidemia, unspecified: Secondary | ICD-10-CM | POA: Diagnosis not present

## 2023-01-26 DIAGNOSIS — R488 Other symbolic dysfunctions: Secondary | ICD-10-CM | POA: Diagnosis not present

## 2023-01-26 DIAGNOSIS — I5032 Chronic diastolic (congestive) heart failure: Secondary | ICD-10-CM | POA: Diagnosis not present

## 2023-01-26 DIAGNOSIS — R109 Unspecified abdominal pain: Secondary | ICD-10-CM | POA: Diagnosis not present

## 2023-01-26 LAB — CBC
HCT: 26.2 % — ABNORMAL LOW (ref 36.0–46.0)
HCT: 36.1 % (ref 36.0–46.0)
Hemoglobin: 11.5 g/dL — ABNORMAL LOW (ref 12.0–15.0)
Hemoglobin: 8.1 g/dL — ABNORMAL LOW (ref 12.0–15.0)
MCH: 28.8 pg (ref 26.0–34.0)
MCH: 29.7 pg (ref 26.0–34.0)
MCHC: 30.9 g/dL (ref 30.0–36.0)
MCHC: 31.9 g/dL (ref 30.0–36.0)
MCV: 90.3 fL (ref 80.0–100.0)
MCV: 96 fL (ref 80.0–100.0)
Platelets: 145 10*3/uL — ABNORMAL LOW (ref 150–400)
Platelets: 245 10*3/uL (ref 150–400)
RBC: 2.73 MIL/uL — ABNORMAL LOW (ref 3.87–5.11)
RBC: 4 MIL/uL (ref 3.87–5.11)
RDW: 15.9 % — ABNORMAL HIGH (ref 11.5–15.5)
RDW: 16.1 % — ABNORMAL HIGH (ref 11.5–15.5)
WBC: 4.6 10*3/uL (ref 4.0–10.5)
WBC: 8.6 10*3/uL (ref 4.0–10.5)
nRBC: 0 % (ref 0.0–0.2)
nRBC: 0 % (ref 0.0–0.2)

## 2023-01-26 LAB — COMPREHENSIVE METABOLIC PANEL
ALT: 38 U/L (ref 0–44)
ALT: 40 U/L (ref 0–44)
AST: 44 U/L — ABNORMAL HIGH (ref 15–41)
AST: 57 U/L — ABNORMAL HIGH (ref 15–41)
Albumin: 3.4 g/dL — ABNORMAL LOW (ref 3.5–5.0)
Albumin: 3.5 g/dL (ref 3.5–5.0)
Alkaline Phosphatase: 55 U/L (ref 38–126)
Alkaline Phosphatase: 58 U/L (ref 38–126)
Anion gap: 12 (ref 5–15)
Anion gap: 9 (ref 5–15)
BUN: 36 mg/dL — ABNORMAL HIGH (ref 8–23)
BUN: 41 mg/dL — ABNORMAL HIGH (ref 8–23)
CO2: 22 mmol/L (ref 22–32)
CO2: 22 mmol/L (ref 22–32)
Calcium: 8.7 mg/dL — ABNORMAL LOW (ref 8.9–10.3)
Calcium: 8.9 mg/dL (ref 8.9–10.3)
Chloride: 104 mmol/L (ref 98–111)
Chloride: 108 mmol/L (ref 98–111)
Creatinine, Ser: 2.14 mg/dL — ABNORMAL HIGH (ref 0.44–1.00)
Creatinine, Ser: 2.53 mg/dL — ABNORMAL HIGH (ref 0.44–1.00)
GFR, Estimated: 18 mL/min — ABNORMAL LOW (ref >60)
GFR, Estimated: 22 mL/min — ABNORMAL LOW (ref >60)
Glucose, Bld: 101 mg/dL — ABNORMAL HIGH (ref 70–99)
Glucose, Bld: 104 mg/dL — ABNORMAL HIGH (ref 70–99)
Potassium: 4.2 mmol/L (ref 3.5–5.1)
Potassium: 4.8 mmol/L (ref 3.5–5.1)
Sodium: 138 mmol/L (ref 135–145)
Sodium: 139 mmol/L (ref 135–145)
Total Bilirubin: 0.5 mg/dL (ref 0.3–1.2)
Total Bilirubin: 0.6 mg/dL (ref 0.3–1.2)
Total Protein: 5.8 g/dL — ABNORMAL LOW (ref 6.5–8.1)
Total Protein: 5.8 g/dL — ABNORMAL LOW (ref 6.5–8.1)

## 2023-01-26 LAB — HEPATIC FUNCTION PANEL
ALT: 38 U/L (ref 0–44)
AST: 44 U/L — ABNORMAL HIGH (ref 15–41)
Albumin: 3.4 g/dL — ABNORMAL LOW (ref 3.5–5.0)
Alkaline Phosphatase: 56 U/L (ref 38–126)
Bilirubin, Direct: <0.1 mg/dL (ref 0.0–0.2)
Total Bilirubin: 0.7 mg/dL (ref 0.3–1.2)
Total Protein: 5.9 g/dL — ABNORMAL LOW (ref 6.5–8.1)

## 2023-01-26 LAB — FOLATE: Folate: 29.8 ng/mL (ref >5.9)

## 2023-01-26 LAB — ABO/RH: ABO/RH(D): A POS

## 2023-01-26 LAB — MAGNESIUM: Magnesium: 2.6 mg/dL — ABNORMAL HIGH (ref 1.7–2.4)

## 2023-01-26 LAB — RETICULOCYTES
Immature Retic Fract: 1.4 % — ABNORMAL LOW (ref 2.3–15.9)
RBC.: 3.92 MIL/uL (ref 3.87–5.11)
Retic Count, Absolute: 29.4 10*3/uL (ref 19.0–186.0)
Retic Ct Pct: 0.8 % (ref 0.4–3.1)

## 2023-01-26 LAB — TYPE AND SCREEN
ABO/RH(D): A POS
Antibody Screen: NEGATIVE

## 2023-01-26 LAB — TROPONIN I (HIGH SENSITIVITY)
Troponin I (High Sensitivity): 14 ng/L (ref <18)
Troponin I (High Sensitivity): 8 ng/L (ref <18)

## 2023-01-26 LAB — FERRITIN: Ferritin: 226 ng/mL (ref 11–307)

## 2023-01-26 LAB — PROTIME-INR
INR: 1.1 (ref 0.8–1.2)
Prothrombin Time: 13.9 s (ref 11.4–15.2)

## 2023-01-26 LAB — POC OCCULT BLOOD, ED: Fecal Occult Bld: NEGATIVE

## 2023-01-26 LAB — IRON AND TIBC
Iron: 38 ug/dL (ref 28–170)
Saturation Ratios: 13 % (ref 10.4–31.8)
TIBC: 300 ug/dL (ref 250–450)
UIBC: 262 ug/dL

## 2023-01-26 LAB — VITAMIN B12: Vitamin B-12: 1154 pg/mL — ABNORMAL HIGH (ref 180–914)

## 2023-01-26 LAB — TSH: TSH: 1.61 u[IU]/mL (ref 0.350–4.500)

## 2023-01-26 LAB — LIPASE, BLOOD: Lipase: 92 U/L — ABNORMAL HIGH (ref 11–51)

## 2023-01-26 MED ORDER — LOSARTAN POTASSIUM 25 MG PO TABS: 25.0000 mg | ORAL_TABLET | Freq: Every day | ORAL | Status: DC

## 2023-01-26 MED ORDER — FLECAINIDE ACETATE 50 MG PO TABS: 50.0000 mg | ORAL_TABLET | Freq: Two times a day (BID) | ORAL | Status: DC

## 2023-01-26 MED ORDER — EZETIMIBE 10 MG PO TABS
10.0000 mg | ORAL_TABLET | Freq: Every day | ORAL | Status: DC
  Administered 2023-01-26 – 2023-02-03 (×8): 10 mg via ORAL
  Filled 2023-01-26 (×8): qty 1

## 2023-01-26 MED ORDER — SODIUM CHLORIDE 0.9 % IV BOLUS
500.0000 mL | Freq: Once | INTRAVENOUS | Status: AC
  Administered 2023-01-26: 500 mL via INTRAVENOUS

## 2023-01-26 MED ORDER — SODIUM CHLORIDE 0.9 % IV SOLN
2.0000 g | INTRAVENOUS | Status: DC
  Administered 2023-01-26 – 2023-01-27 (×2): 2 g via INTRAVENOUS
  Filled 2023-01-26 (×2): qty 20

## 2023-01-26 MED ORDER — PANTOPRAZOLE SODIUM 40 MG PO TBEC
40.0000 mg | DELAYED_RELEASE_TABLET | Freq: Every day | ORAL | Status: DC
  Administered 2023-01-28 – 2023-02-03 (×7): 40 mg via ORAL
  Filled 2023-01-26 (×8): qty 1

## 2023-01-26 MED ORDER — HYDROMORPHONE HCL 1 MG/ML IJ SOLN
0.5000 mg | INTRAMUSCULAR | Status: DC | PRN
  Administered 2023-01-26: 0.5 mg via INTRAVENOUS
  Filled 2023-01-26: qty 1

## 2023-01-26 MED ORDER — SODIUM CHLORIDE 0.9% FLUSH
3.0000 mL | INTRAVENOUS | Status: DC | PRN
  Administered 2023-01-31: 3 mL via INTRAVENOUS

## 2023-01-26 MED ORDER — MEMANTINE HCL 10 MG PO TABS
10.0000 mg | ORAL_TABLET | Freq: Two times a day (BID) | ORAL | Status: DC
  Administered 2023-01-26 – 2023-02-03 (×16): 10 mg via ORAL
  Filled 2023-01-26 (×16): qty 1

## 2023-01-26 MED ORDER — FERROUS GLUCONATE 324 (38 FE) MG PO TABS
324.0000 mg | ORAL_TABLET | Freq: Every day | ORAL | Status: DC
  Administered 2023-01-26 – 2023-02-03 (×8): 324 mg via ORAL
  Filled 2023-01-26 (×10): qty 1

## 2023-01-26 MED ORDER — SENNOSIDES-DOCUSATE SODIUM 8.6-50 MG PO TABS
1.0000 | ORAL_TABLET | Freq: Every evening | ORAL | Status: DC | PRN
  Administered 2023-01-26 – 2023-01-28 (×4): 1 via ORAL
  Filled 2023-01-26 (×4): qty 1

## 2023-01-26 MED ORDER — METRONIDAZOLE 500 MG/100ML IV SOLN
500.0000 mg | Freq: Two times a day (BID) | INTRAVENOUS | Status: DC
  Administered 2023-01-26 – 2023-01-27 (×3): 500 mg via INTRAVENOUS
  Filled 2023-01-26 (×3): qty 100

## 2023-01-26 MED ORDER — LEVOTHYROXINE SODIUM 75 MCG PO TABS
75.0000 ug | ORAL_TABLET | Freq: Every day | ORAL | Status: DC
  Administered 2023-01-26 – 2023-02-03 (×7): 75 ug via ORAL
  Filled 2023-01-26 (×8): qty 1

## 2023-01-26 MED ORDER — SODIUM CHLORIDE 0.9 % IV SOLN: 250.0000 mL | INTRAVENOUS | Status: DC | PRN

## 2023-01-26 MED ORDER — SODIUM CHLORIDE 0.9% FLUSH
3.0000 mL | Freq: Two times a day (BID) | INTRAVENOUS | Status: DC
  Administered 2023-01-26 – 2023-02-03 (×15): 3 mL via INTRAVENOUS

## 2023-01-26 MED ORDER — ALBUTEROL SULFATE (2.5 MG/3ML) 0.083% IN NEBU
3.0000 mL | INHALATION_SOLUTION | Freq: Four times a day (QID) | RESPIRATORY_TRACT | Status: DC | PRN
  Administered 2023-01-27 – 2023-02-02 (×2): 3 mL via RESPIRATORY_TRACT
  Filled 2023-01-26 (×2): qty 3

## 2023-01-26 MED ORDER — DEXTROSE IN LACTATED RINGERS 5 % IV SOLN: INTRAVENOUS | Status: AC

## 2023-01-26 MED ORDER — METHOCARBAMOL 500 MG PO TABS
500.0000 mg | ORAL_TABLET | Freq: Four times a day (QID) | ORAL | Status: DC | PRN
  Administered 2023-01-28 – 2023-02-03 (×10): 500 mg via ORAL
  Filled 2023-01-26 (×11): qty 1

## 2023-01-26 MED ORDER — MORPHINE SULFATE (PF) 2 MG/ML IV SOLN
1.0000 mg | INTRAVENOUS | Status: DC | PRN
  Administered 2023-01-26: 1 mg via INTRAVENOUS
  Filled 2023-01-26: qty 1

## 2023-01-26 MED ORDER — AMLODIPINE BESYLATE 10 MG PO TABS
10.0000 mg | ORAL_TABLET | Freq: Every day | ORAL | Status: DC
  Administered 2023-01-26 – 2023-02-03 (×8): 10 mg via ORAL
  Filled 2023-01-26 (×8): qty 1

## 2023-01-26 MED ORDER — ONDANSETRON HCL 4 MG/2ML IJ SOLN: 4.0000 mg | Freq: Four times a day (QID) | INTRAMUSCULAR | Status: DC | PRN

## 2023-01-26 MED ORDER — ACETAMINOPHEN 325 MG PO TABS
650.0000 mg | ORAL_TABLET | Freq: Four times a day (QID) | ORAL | Status: DC | PRN
  Administered 2023-01-26 – 2023-01-29 (×2): 650 mg via ORAL
  Filled 2023-01-26 (×2): qty 2

## 2023-01-26 MED ORDER — ONDANSETRON HCL 4 MG PO TABS: 4.0000 mg | ORAL_TABLET | Freq: Four times a day (QID) | ORAL | Status: DC | PRN

## 2023-01-26 NOTE — Progress Notes (Signed)
Patient is too emotional, uncooperative in answering admission questions due to ED cutting her clothes.

## 2023-01-26 NOTE — Progress Notes (Signed)
  Patient's bedside nurse reported that noticing bradycardia heart rate 32-42.  Heart rate has been improved to 62 now. Holding flecainide. - Continue cardiac monitoring - Obtaining EKG if patient cooperates. - Consulted and reach out to cardiology for evaluation in in the daytime. -Pending echocardiogram.  Tereasa Coop, MD [facility] 01/26/2023, 6:24 AM

## 2023-01-26 NOTE — Progress Notes (Signed)
  Spoke with on-call cardiology Dr. Lendell Caprice for the concern for bradycardia. He will evaluate the EKG and  cardiology team in the a.m. will evaluate the patient. Appreciate cardiology input.  Tereasa Coop, MD [facility] 01/26/2023, 8:16 PM

## 2023-01-26 NOTE — Progress Notes (Signed)
Dr Janalyn Shy updated about Heart rate sustaining between 32-42 bpm.  EKG ordered but patient is uncooperative at the moment due to her bed not being comfortable as what she has at home and that her clothes were cut in the ED, Dr Janalyn Shy updated.

## 2023-01-26 NOTE — Consult Note (Signed)
Referring Provider: Dr. Janalyn Shy  Chief Complaint: Abdominal pain  Reason for Consult: Cholelithiasis   Subjective   HPI: [name] is an 87 y.o. female with multiple medical problems including CKD stage IV, HTN, paroxysmal atrial fibrillation on Eliquis 2.5 mg twice daily, hypothyroidism, hyperlipidemia, history of CVA/TIA, and chronic anemia. She presented to the ED yesterday with chief complaint abdominal pain and shortness of breath.  Pain is located in the right upper quadrant. States that it has been present for "some time." It is constant. Sometimes worse with movement. Denies n/v. States that it is not worse with PO intake and she has been tolerating diet at home. She has had multiple CT scans over the last few months investigating abdominal pain without clear explanation.  She saw gastroenterology on 12/18/22.   She has had elevation in her lipase.    Of note, patient recently admitted for bradycardia issues. Cardiology to see.   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74   age 36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 2 weeks   Hemorrhoids 2007   History of kidney stones    HTN (hypertension)    Hyperplastic colon polyp 2005   Hypothyroidism    Narcolepsy    PONV (postoperative nausea and vomiting)    TIA (transient ischemic attack)     Past Surgical History:  Procedure Laterality Date   ABDOMINAL HYSTERECTOMY     age 82, complicated by poor wound healing, followed by revision of scar and radiation for ?malignancy   BACK SURGERY  07/2018   lost 2 inches   BREAST ENHANCEMENT SURGERY     BREAST  IMPLANT EXCHANGE Right 06/23/2016   Procedure: RIGHT BREAST IMPLANT REMOVAL AND REPLACEMENT;  Surgeon: Louisa Second, MD;  Location: [facility];  Service: Plastics;  Laterality: Right;   COLONOSCOPY  2005   2 hyperplastic polyps   COLONOSCOPY  2007   Dr. Darrick Penna- hemorrhoids   COLONOSCOPY  2008   Dr. Steva Ready polyp- rare sigmoid diverticulosis, internal hemorrhoids   COLONOSCOPY  12/01/2011   Procedure: COLONOSCOPY;  Surgeon: West Bali, MD;  [facility];  Service: Endoscopy;  Laterality: N/A;  12:30 PM   COLONOSCOPY WITH PROPOFOL N/A 05/22/2022   Procedure: COLONOSCOPY WITH PROPOFOL;  Surgeon: Lanelle Bal, DO;  [facility];  Service: Endoscopy;  Laterality: N/A;  1:00 PM   CYSTOSCOPY/URETEROSCOPY/HOLMIUM LASER/STENT PLACEMENT Left 02/29/2020   Procedure: CYSTOSCOPY/URETEROSCOPY/HOLMIUM LASER/STENT PLACEMENT;  Surgeon: Rene Paci, MD;  Location: [facility];  Service: Urology;  Laterality: Left;   epidural steroid injection     She is getting injections with Dr. Ethelene Hal q3wks.   ESOPHAGOGASTRODUODENOSCOPY (EGD) WITH ESOPHAGEAL DILATION N/A 04/15/2013   Procedure: ESOPHAGOGASTRODUODENOSCOPY (EGD) WITH ESOPHAGEAL DILATION;  Surgeon: West Bali, MD;  [facility];  Service: Endoscopy;  Laterality: N/A;  11:45-moved to 12:30 [name] to notify pt   EYE SURGERY  2010   EYE SURGERY     IR KYPHO  LUMBAR INC FX REDUCE BONE BX UNI/BIL CANNULATION INC/IMAGING  08/23/2018   IR RADIOLOGIST EVAL & MGMT  12/23/2018   POLYPECTOMY  05/22/2022   Procedure: POLYPECTOMY;  Surgeon: Lanelle Bal, DO;  [facility];  Service: Endoscopy;;   right thumb surgery      Family History  Problem Relation Age of Onset   Colon cancer Father        age 39   Prostate cancer Father    Pancreatic cancer Mother        age 57    Social:  reports that she has never smoked. She has never been exposed to tobacco smoke. She has  never used smokeless tobacco. She reports that she does not drink alcohol and does not use drugs.  Allergies:  Allergies  Allergen Reactions   Epinephrine Anaphylaxis   Demerol [Meperidine] Other (See Comments)    headaches   Morphine Sulfate Anxiety    Patient states it messes with her mind makes her narcoleptic   Vicodin [Hydrocodone-Acetaminophen] Other (See Comments)    York Spaniel it makes her crazy. Pt tolerates acetaminophen    Medications: Current Outpatient Medications  Medication Instructions   albuterol (VENTOLIN HFA) 108 (90 Base) MCG/ACT inhaler 2 puffs, Inhalation, Every 6 hours PRN   amLODipine (NORVASC) 10 mg, Oral, Daily, For BP   apixaban (ELIQUIS) 2.5 mg, Oral, 2 times daily   B Complex Vitamins (VITAMIN B COMPLEX) TABS 1 tablet, Oral, Daily   Calcium Carb-Cholecalciferol (CALCIUM 600 + D PO) 1 tablet, Oral, 2 times daily   ezetimibe (ZETIA) 10 mg, Oral, Daily   Ferrous Gluconate (IRON) 240 (27 Fe) MG TABS 1 tablet, Oral, Daily   Fish Oil 1,200 mg, Oral, Daily   flecainide (TAMBOCOR) 50 MG tablet TAKE 1 TABLET(50 MG) BY MOUTH TWICE DAILY   furosemide (LASIX) 40 mg, Oral, Daily   levothyroxine (SYNTHROID) 75 mcg, Oral, Daily before breakfast   lidocaine (LIDODERM) 5 % 1 patch, Transdermal, Every 24 hours, Remove & Discard patch within 12 hours or as directed by MD   losartan (COZAAR) 25 mg, Oral, Daily   Magnesium 250 mg, Oral, Daily   memantine (NAMENDA) 10 mg, Oral, 2 times daily   pantoprazole (PROTONIX) 40 mg, Oral, Daily   Potassium Chloride ER 20 MEQ TBCR 20 mEq, Oral, Daily, 1 tab daily by mouth--- take while taking Lasix/furosemide   senna-docusate (SENOKOT-S) 8.6-50 MG tablet 2 tablets, Oral, Daily at bedtime   vitamin C 1,000 mg, Oral, Daily   Vitamin D, Cholecalciferol, 25 MCG (1000 UT) TABS 1,000 Int'l Units/1.39m2, Oral, Daily   vitamin E 400 Units, Oral, Daily    ROS - all of the below systems have been reviewed with the patient and positives are  indicated with bold text General: chills, fever or night sweats Eyes: blurry vision or double vision ENT: epistaxis or sore throat Allergy/Immunology: itchy/watery eyes or nasal congestion Hematologic/Lymphatic: bleeding problems, blood clots or swollen lymph nodes Endocrine: temperature intolerance or unexpected weight changes Breast: new or changing breast lumps or nipple discharge Resp: cough, shortness of breath, or wheezing CV: chest pain or dyspnea on exertion GI: as per HPI GU: dysuria, trouble voiding, or hematuria MSK: joint pain or joint stiffness Neuro: TIA or stroke symptoms Derm: pruritus and skin lesion changes Psych: anxiety and depression  Objective   PE Blood pressure (!) 123/58, pulse 62, temperature 98 F (36.7 C), temperature source Oral, resp. rate 20, weight 62.9 kg, SpO2 100%. Gen: Alert, NAD; conversant;  no deformities Lungs: Normal respiratory effort on room air CV: bradycardia GI: Abd soft, ND, mild upper abdominal tenderness without rebound or guarding, pain is across the entire upper abdomen but subjectively worse in the RUQ; no palpable hepatosplenomegaly. Negative Eulah Pont sign  Results for orders placed or performed during the hospital encounter of 01/25/23 (from the past 24 hour(s))  CBC     Status: Abnormal   Collection Time: 01/25/23 11:40 PM  Result Value Ref Range   WBC 4.6 4.0 - 10.5 K/uL   RBC 2.73 (L) 3.87 - 5.11 MIL/uL   Hemoglobin 8.1 (L) 12.0 - 15.0 g/dL   HCT 91.4 (L) 78.2 - 95.6 %   MCV 96.0 80.0 - 100.0 fL   MCH 29.7 26.0 - 34.0 pg   MCHC 30.9 30.0 - 36.0 g/dL   RDW 21.3 (H) 08.6 - 57.8 %   Platelets 145 (L) 150 - 400 K/uL   nRBC 0.0 0.0 - 0.2 %  Comprehensive metabolic panel     Status: Abnormal   Collection Time: 01/25/23 11:40 PM  Result Value Ref Range   Sodium 138 135 - 145 mmol/L   Potassium 4.8 3.5 - 5.1 mmol/L   Chloride 104 98 - 111 mmol/L   CO2 22 22 - 32 mmol/L   Glucose, Bld 104 (H) 70 - 99 mg/dL   BUN 41 (H) 8 -  23 mg/dL   Creatinine, Ser 4.69 (H) 0.44 - 1.00 mg/dL   Calcium 8.9 8.9 - 62.9 mg/dL   Total Protein 5.8 (L) 6.5 - 8.1 g/dL   Albumin 3.5 3.5 - 5.0 g/dL   AST 57 (H) 15 - 41 U/L   ALT 38 0 - 44 U/L   Alkaline Phosphatase 58 38 - 126 U/L   Total Bilirubin 0.6 0.3 - 1.2 mg/dL   GFR, Estimated 18 (L) >60 mL/min   Anion gap 12 5 - 15  Lipase, blood     Status: Abnormal   Collection Time: 01/25/23 11:40 PM  Result Value Ref Range   Lipase 92 (H) 11 - 51 U/L  Troponin I (High Sensitivity)     Status: None   Collection Time: 01/25/23 11:40 PM  Result Value Ref Range   Troponin I (High Sensitivity) 14 <18 ng/L  Magnesium     Status: Abnormal   Collection Time: 01/25/23 11:40 PM  Result Value Ref Range   Magnesium 2.6 (H) 1.7 - 2.4 mg/dL  ABO/Rh     Status: None   Collection Time: 01/25/23 11:40 PM  Result Value Ref Range   ABO/RH(D)      A POS Performed at [facility], [address], [place], [place] [address]   Troponin I (High Sensitivity)     Status: None   Collection Time: 01/26/23  1:20 AM  Result Value Ref Range   Troponin I (High Sensitivity) 8 <18 ng/L  TSH     Status: None   Collection Time: 01/26/23  1:20 AM  Result Value Ref Range   TSH 1.610 0.350 - 4.500 uIU/mL  POC occult blood, ED     Status: None   Collection Time: 01/26/23  1:49 AM  Result Value Ref Range   Fecal Occult Bld NEGATIVE NEGATIVE  Type and screen [facility]     Status: None   Collection Time: 01/26/23  2:12 AM  Result Value Ref Range   ABO/RH(D) A POS    Antibody Screen NEG    Sample Expiration  01/29/2023,2359 Performed at [facility], [address] [place], [place] [address]   Protime-INR     Status: None   Collection Time: 01/26/23  5:37 AM  Result Value Ref Range   Prothrombin Time 13.9 11.4 - 15.2 seconds   INR 1.1 0.8 - 1.2  Reticulocytes     Status: Abnormal   Collection Time: 01/26/23  5:37 AM  Result Value Ref Range   Retic Ct Pct 0.8 0.4 -  3.1 %   RBC. 3.92 3.87 - 5.11 MIL/uL   Retic Count, Absolute 29.4 19.0 - 186.0 K/uL   Immature Retic Fract 1.4 (L) 2.3 - 15.9 %     Imaging Orders         DG Chest Port 1 View         CT ABDOMEN PELVIS WO CONTRAST         CT L-SPINE NO CHARGE         US Abdomen Limited RUQ (LIVER/GB)    RUQ [place]  Small gallstone. 9 mm gallbladder wall polyp. Mild gallbladder wall thickening. Recommend clinical correlation for possible early cholecystitis.   CT Abd/Pel   1. Small amount of pericholecystic fluid, slightly increased from prior. No radiopaque stone. If there is clinical concern for acute cholecystitis, consider further evaluation with right upper quadrant ultrasound. 2. Unchanged left nephrolithiasis. No obstructing ureteral calculi. No hydronephrosis. 3. No acute fracture or traumatic listhesis in the lumbar spine. 4. Cardiomegaly with interlobular septal thickening in the lower lungs and trace left effusion, suggestive of pulmonary edema.  Anti-infectives (From admission, onward)    Start     Dose/Rate Route Frequency Ordered Stop   01/26/23 0400  cefTRIAXone (ROCEPHIN) 2 g in sodium chloride 0.9 % 100 mL IVPB        2 g 200 mL/hr over 30 Minutes Intravenous Every 24 hours 01/26/23 0348 01/31/23 0359   01/26/23 0400  metroNIDAZOLE (FLAGYL) IVPB 500 mg        500 mg 100 mL/hr over 60 Minutes Intravenous Every 12 hours 01/26/23 0348           Assessment and Plan   [name] is an 87 y.o. female with abdominal pain, gallstones, and concern for cholecystitis.  I recommend HIDA scan as there has been diagnostic uncertainty for a few months now about what is causing her presentation.  Once the HIDA is complete we will provide additional recommendations.  ID - rocephin/flagyl FEN - IVF, NPO VTE - SCDs. Please hold eliquis. Ok for heparin gtt if indicated.  Bradycardia - ECHO and cardiology c/s pending CKD stage IV HTN Paroxysmal atrial fibrillation - hold  Eliquis Hypothyroidism HLD History of CVA/TIA Chronic anemia Memory impairment   Franne Forts, [facility] Use [email or web address] to contact on call provider

## 2023-01-26 NOTE — Progress Notes (Signed)
PROGRESS NOTE    [name]  QVZ:[id number] DOB: 1935/12/08 DOA: 01/25/2023 PCP: Benita Stabile, MD   Brief Narrative:  [name] is a 87 y.o. female with medical history CKD stage IV, significant of essential hypertension, paroxysmal atrial fibrillation on Eliquis 2.5 mg twice daily, hypothyroidism, hyperlipidemia, history of CVA/TIA, and chronic anemia presented to emergency department for evaluation for abdominal pain.  She was diagnosed with acute cholecystitis.  Initially hemoglobin was down to 8.1 all the way from 13 of baseline but FOBT was negative and repeat hemoglobin is 11 so 8.1 was likely an error.  Patient was admitted under hospitalist service, started on antibiotics, general surgery consulted.      Assessment & Plan:   Principal Problem:   Acute cholecystitis Active Problems:   Acute anemia   Hypothyroidism   History of CVA (cerebrovascular accident)   Hyperlipidemia   Memory impairment   CKD (chronic kidney disease), stage IV (HCC)   Acute kidney injury superimposed on CKD (HCC)   Atrial fibrillation, chronic (HCC)   Chronic back pain due to kyphosis  Acute cholecystitis/cholelithiasis:  RUQ which showed gallstone 9 mm gallbladder wall polyp, mild gallbladder wall thickening possible early cholecystitis.  She complains of right upper quadrant pain and has tenderness as well.  Patient has been started on Rocephin and Flagyl which we will continue and await general surgery consultation.  Asymptomatic sinus bradycardia with history of paroxysmal atrial fibrillation: TSH within normal limit.  Echo pending.  Patient was on flecainide 50 mg twice daily at home which has been stopped.  Cardiology consulted per notes from admitting hospitalist.  Eliquis on hold until further clarification of plans for general surgery.  Mild normocytic anemia: Patient's baseline hemoglobin appears to be around 13-14, current hemoglobin 7.5.  FOBT negative.  Iron studies and B12  normal as well.  Likely due to chronic disease.  No indication of transfusion.  Monitor.  AKI on CKD stage IIIb: Baseline creatinine around 1.3-1.9, she presented with 2.53 which is improved to 2.14.  She is on dextrose fluids, recent blood sugar 101.  Continue dextrose.  Chronic grade 3 diastolic heart failure: Appears to be euvolemic.  Holding Lasix and losartan in the setting of AKI.  Hypothyroidism, acquired: Continue Synthroid.  Hyperlipidemia: Continue ezetimibe.  Memory impairment: Continue memantine.  She is currently fully alert and oriented.  Chronic low back pain and right hip pain: Patient states that she lives with the pain.  Current pain is at her baseline.  Essential hypertension: Stable.  Continue home dose of amlodipine but holding losartan.  DVT prophylaxis: SCDs Start: 01/26/23 0349   Code Status: Full Code  Family Communication:  None present at bedside.  Plan of care discussed with patient in length and he/she verbalized understanding and agreed with it.  Status is: Inpatient Remains inpatient appropriate because: Needs IV antibiotics and evaluation by general surgery.  Needs IV fluids for AKI.   Estimated body mass index is 27.08 kg/m as calculated from the following:   Height as of 01/01/23: 5' (1.524 m).   Weight as of this encounter: 62.9 kg.    Nutritional Assessment: Body mass index is 27.08 kg/m.Marland Kitchen Seen by dietician.  I agree with the assessment and plan as outlined below: Nutrition Status:        . Skin Assessment: I have examined the patient's skin and I agree with the wound assessment as performed by the wound care RN as outlined below:    Consultants:  General surgery, cardiology.  Procedures:  None  Antimicrobials:  Anti-infectives (From admission, onward)    Start     Dose/Rate Route Frequency Ordered Stop   01/26/23 0400  cefTRIAXone (ROCEPHIN) 2 g in sodium chloride 0.9 % 100 mL IVPB        2 g 200 mL/hr over 30 Minutes  Intravenous Every 24 hours 01/26/23 0348 01/31/23 0359   01/26/23 0400  metroNIDAZOLE (FLAGYL) IVPB 500 mg        500 mg 100 mL/hr over 60 Minutes Intravenous Every 12 hours 01/26/23 0348           Subjective: Patient seen and examined.  She complains of chronic and stable low back pain and right hip pain.  But she also complains of right upper quadrant abdominal pain which is new to her.  No other complaint.  Objective: Vitals:   01/26/23 0446 01/26/23 0522 01/26/23 0528 01/26/23 0759  BP:   (!) 123/58 (!) 116/56  Pulse:   62 (!) 55  Resp:   20 19  Temp: 97.8 F (36.6 C)  98 F (36.7 C) (!) 97.5 F (36.4 C)  TempSrc: Oral  Oral   SpO2:   100% 91%  Weight:  62.9 kg     No intake or output data in the 24 hours ending 01/26/23 1032 Filed Weights   01/26/23 0522  Weight: 62.9 kg    Examination:  General exam: Appears calm and comfortable  Respiratory system: Clear to auscultation. Respiratory effort normal. Cardiovascular system: S1 & S2 heard, RRR. No JVD, murmurs, rubs, gallops or clicks. No pedal edema. Gastrointestinal system: Abdomen is nondistended, soft and tender at epigastrium and right upper quadrant area. No organomegaly or masses felt. Normal bowel sounds heard. Central nervous system: Alert and oriented. No focal neurological deficits. Extremities: Symmetric 5 x 5 power. Skin: No rashes, lesions or ulcers Psychiatry: Judgement and insight appear normal. Mood & affect appropriate.    Data Reviewed: I have personally reviewed following labs and imaging studies  CBC: Recent Labs  Lab 01/25/23 2340 01/26/23 0537  WBC 4.6 8.6  HGB 8.1* 11.5*  HCT 26.2* 36.1  MCV 96.0 90.3  PLT 145* 245   Basic Metabolic Panel: Recent Labs  Lab 01/25/23 2340 01/26/23 0542  NA 138 139  K 4.8 4.2  CL 104 108  CO2 22 22  GLUCOSE 104* 101*  BUN 41* 36*  CREATININE 2.53* 2.14*  CALCIUM 8.9 8.7*  MG 2.6*  --    GFR: Estimated Creatinine Clearance: 15.9 mL/min (A)  (by C-G formula based on SCr of 2.14 mg/dL (H)). Liver Function Tests: Recent Labs  Lab 01/25/23 2340 01/26/23 0120 01/26/23 0542  AST 57* 44* 44*  ALT 38 38 40  ALKPHOS 58 56 55  BILITOT 0.6 0.7 0.5  PROT 5.8* 5.9* 5.8*  ALBUMIN 3.5 3.4* 3.4*   Recent Labs  Lab 01/25/23 2340  LIPASE 92*   No results for input(s): "AMMONIA" in the last 168 hours. Coagulation Profile: Recent Labs  Lab 01/26/23 0537  INR 1.1   Cardiac Enzymes: No results for input(s): "CKTOTAL", "CKMB", "CKMBINDEX", "TROPONINI" in the last 168 hours. BNP (last 3 results) No results for input(s): "PROBNP" in the last 8760 hours. HbA1C: No results for input(s): "HGBA1C" in the last 72 hours. CBG: No results for input(s): "GLUCAP" in the last 168 hours. Lipid Profile: No results for input(s): "CHOL", "HDL", "LDLCALC", "TRIG", "CHOLHDL", "LDLDIRECT" in the last 72 hours. Thyroid Function Tests: Recent Labs  01/26/23 0120  TSH 1.610   Anemia Panel: Recent Labs    01/26/23 0537 01/26/23 0542  VITAMINB12 1,154*  --   FOLATE  --  29.8  FERRITIN 226  --   TIBC 300  --   IRON 38  --   RETICCTPCT 0.8  --    Sepsis Labs: No results for input(s): "PROCALCITON", "LATICACIDVEN" in the last 168 hours.  No results found for this or any previous visit (from the past 240 hour(s)).   Radiology Studies: US Abdomen Limited RUQ (LIVER/GB)  Result Date: 01/26/2023 CLINICAL DATA:  Right upper quadrant pain EXAM: ULTRASOUND ABDOMEN LIMITED RIGHT UPPER QUADRANT COMPARISON:  CT earlier today FINDINGS: Gallbladder: 6 mm stone in the gallbladder neck region. 9 mm polyp noted. Mild gallbladder wall thickening at 4 mm. Common bile duct: Diameter: Upper limits normal in size for patient's age measuring up to 7-8 mm. Liver: No focal lesion identified. Within normal limits in parenchymal echogenicity. Portal vein is patent on color Doppler imaging with normal direction of blood flow towards the liver. Other: None.  IMPRESSION: Small gallstone. 9 mm gallbladder wall polyp. Mild gallbladder wall thickening. Recommend clinical correlation for possible early cholecystitis. Electronically Signed   By: Charlett Nose M.D.   On: 01/26/2023 03:11   CT ABDOMEN PELVIS WO CONTRAST  Result Date: 01/26/2023 CLINICAL DATA:  Abdominal pain, acute, to the right flank EXAM: CT ABDOMEN AND PELVIS WITHOUT CONTRAST CT LUMBAR SPINE WITHOUT CONTRAST TECHNIQUE: Multidetector CT imaging of the abdomen and pelvis was performed following the standard protocol without IV contrast. Multidetector CT imaging of the lumbar spine was performed without intravenous contrast administration. Multiplanar CT image reconstructions were also generated. RADIATION DOSE REDUCTION: This exam was performed according to the departmental dose-optimization program which includes automated exposure control, adjustment of the mA and/or kV according to patient size and/or use of iterative reconstruction technique. COMPARISON:  CT abdomen and pelvis 01/01/2023 FINDINGS: ABDOMEN/PELVIS: Lower chest: Cardiomegaly. Interlobular septal thickening in the lower lungs. Trace left effusion. Hepatobiliary: Small amount of pericholecystic fluid, slightly increased from prior. No radiopaque stone. Stable caliber biliary ducts. Scattered hepatic cysts. Pancreas: Unremarkable. Spleen: Unremarkable. Adrenals/Urinary Tract: Stable adrenal glands. Unchanged left nephrolithiasis. No obstructing ureteral calculi. No hydronephrosis. Interpolar right kidney 1.8 cm hyperdense hemorrhagic or proteinaceous cyst. No specific follow-up imaging recommended. Unremarkable bladder. Stomach/Bowel: Normal caliber large and small bowel. No bowel wall thickening. Stomach is within normal limits. The appendix is not visualized. Vascular/Lymphatic: Aortic atherosclerosis. No enlarged abdominal or pelvic lymph nodes. Reproductive: Hysterectomy.  No adnexal mass. Other: No free intraperitoneal fluid or air.  Musculoskeletal: No acute fracture. LUMBAR SPINE: Segmentation: 5 lumbar type vertebrae. Alignment: Unchanged alignment from 01/01/2023. No evidence of traumatic listhesis. Vertebrae: No acute fracture. Chronic compression fracture of T12 status post vertebroplasty. Paraspinal and other soft tissues: Reported above. Disc levels: Multilevel spondylosis. Advanced disc space height loss and degenerative endplate changes at L5-S1. Advanced facet arthropathy throughout the lumbar spine. Posterior disc extrusion at L5-S1 causes narrowing of the left lateral recess mild spinal canal narrowing, similar to MRI 08/05/2021. IMPRESSION: 1. Small amount of pericholecystic fluid, slightly increased from prior. No radiopaque stone. If there is clinical concern for acute cholecystitis, consider further evaluation with right upper quadrant ultrasound. 2. Unchanged left nephrolithiasis. No obstructing ureteral calculi. No hydronephrosis. 3. No acute fracture or traumatic listhesis in the lumbar spine. 4. Cardiomegaly with interlobular septal thickening in the lower lungs and trace left effusion, suggestive of pulmonary edema. Aortic Atherosclerosis (ICD10-I70.0). Electronically  Signed   By: Minerva Fester M.D.   On: 01/26/2023 01:13   CT L-SPINE NO CHARGE  Result Date: 01/26/2023 CLINICAL DATA:  Abdominal pain, acute, to the right flank EXAM: CT ABDOMEN AND PELVIS WITHOUT CONTRAST CT LUMBAR SPINE WITHOUT CONTRAST TECHNIQUE: Multidetector CT imaging of the abdomen and pelvis was performed following the standard protocol without IV contrast. Multidetector CT imaging of the lumbar spine was performed without intravenous contrast administration. Multiplanar CT image reconstructions were also generated. RADIATION DOSE REDUCTION: This exam was performed according to the departmental dose-optimization program which includes automated exposure control, adjustment of the mA and/or kV according to patient size and/or use of iterative  reconstruction technique. COMPARISON:  CT abdomen and pelvis 01/01/2023 FINDINGS: ABDOMEN/PELVIS: Lower chest: Cardiomegaly. Interlobular septal thickening in the lower lungs. Trace left effusion. Hepatobiliary: Small amount of pericholecystic fluid, slightly increased from prior. No radiopaque stone. Stable caliber biliary ducts. Scattered hepatic cysts. Pancreas: Unremarkable. Spleen: Unremarkable. Adrenals/Urinary Tract: Stable adrenal glands. Unchanged left nephrolithiasis. No obstructing ureteral calculi. No hydronephrosis. Interpolar right kidney 1.8 cm hyperdense hemorrhagic or proteinaceous cyst. No specific follow-up imaging recommended. Unremarkable bladder. Stomach/Bowel: Normal caliber large and small bowel. No bowel wall thickening. Stomach is within normal limits. The appendix is not visualized. Vascular/Lymphatic: Aortic atherosclerosis. No enlarged abdominal or pelvic lymph nodes. Reproductive: Hysterectomy.  No adnexal mass. Other: No free intraperitoneal fluid or air. Musculoskeletal: No acute fracture. LUMBAR SPINE: Segmentation: 5 lumbar type vertebrae. Alignment: Unchanged alignment from 01/01/2023. No evidence of traumatic listhesis. Vertebrae: No acute fracture. Chronic compression fracture of T12 status post vertebroplasty. Paraspinal and other soft tissues: Reported above. Disc levels: Multilevel spondylosis. Advanced disc space height loss and degenerative endplate changes at L5-S1. Advanced facet arthropathy throughout the lumbar spine. Posterior disc extrusion at L5-S1 causes narrowing of the left lateral recess mild spinal canal narrowing, similar to MRI 08/05/2021. IMPRESSION: 1. Small amount of pericholecystic fluid, slightly increased from prior. No radiopaque stone. If there is clinical concern for acute cholecystitis, consider further evaluation with right upper quadrant ultrasound. 2. Unchanged left nephrolithiasis. No obstructing ureteral calculi. No hydronephrosis. 3. No acute  fracture or traumatic listhesis in the lumbar spine. 4. Cardiomegaly with interlobular septal thickening in the lower lungs and trace left effusion, suggestive of pulmonary edema. Aortic Atherosclerosis (ICD10-I70.0). Electronically Signed   By: Minerva Fester M.D.   On: 01/26/2023 01:13   DG Chest Port 1 View  Result Date: 01/25/2023 CLINICAL DATA:  cp EXAM: PORTABLE CHEST 1 VIEW COMPARISON:  CT angio chest 01/01/2023, chest x-ray 824 FINDINGS: Cardiac paddles overlie the chest. The heart and mediastinal contours are unchanged. Aortic calcification. No focal consolidation. Chronic coarsened interstitial markings with no overt pulmonary edema. No pleural effusion. No pneumothorax. No acute osseous abnormality. IMPRESSION: No active disease. Electronically Signed   By: Tish Frederickson M.D.   On: 01/25/2023 23:36    Scheduled Meds:  amLODipine  10 mg Oral Daily   ezetimibe  10 mg Oral Daily   ferrous gluconate  324 mg Oral Daily   levothyroxine  75 mcg Oral QAC breakfast   memantine  10 mg Oral BID   pantoprazole  40 mg Oral Daily   sodium chloride flush  3 mL Intravenous Q12H   Continuous Infusions:  sodium chloride     cefTRIAXone (ROCEPHIN)  IV Stopped (01/26/23 0546)   dextrose 5% lactated ringers 75 mL/hr at 01/26/23 0526   metronidazole 500 mg (01/26/23 0536)     LOS: 0 days  Hughie Closs, MD [facility]  01/26/2023, 10:32 AM   *Please note that this is a verbal dictation therefore any spelling or grammatical errors are due to the "[employer]agon [employer] One" system interpretation.  Please page via Amion and do not message via secure chat for urgent patient care matters. Secure chat can be used for non urgent patient care matters.  How to contact the [facility] Attending or Consulting provider 7A - 7P or covering provider during after hours 7P -7A, for this patient?  Check the care team in [facility] and look for a) attending/consulting [facility] provider listed and b) the [facility] team listed. Page or  secure chat 7A-7P. Log into [email or web address] and use [facility] universal password to access. If you do not have the password, please contact the hospital operator. Locate the [facility] provider you are looking for under [facility] and page to a number that you can be directly reached. If you still have difficulty reaching the provider, please page the [facility] (Director on Call) for the Hospitalists listed on amion for assistance.

## 2023-01-26 NOTE — ED Notes (Signed)
ED TO INPATIENT HANDOFF REPORT  ED Nurse Name and Phone #: Marchelle Folks RN   S Name/Age/Gender [name] 87 y.o. female Room/Bed: 017C/017C  Code Status   Code Status: Full Code  Home/SNF/Other Home Patient oriented to: self, place, time, and situation Is this baseline? Yes   Triage Complete: Triage complete  Chief Complaint Acute cholecystitis [K81.0]  Triage Note Pt coming in with generalized abd pain, transported by medic, medic reports she was in afib nut did have brief periods of bradycardia, pt presented with a bradycardic rhythm, and was hunched over with abd pain, she is hard of hearing as well.    Allergies Allergies  Allergen Reactions   Epinephrine Anaphylaxis   Demerol [Meperidine] Other (See Comments)    headaches   Vicodin [Hydrocodone-Acetaminophen] Other (See Comments)    York Spaniel it makes her crazy. Pt tolerates acetaminophen    Level of Care/Admitting Diagnosis ED Disposition     ED Disposition  Admit   Condition  --   Comment  Hospital Area: [facility] [[id number]]  Level of Care: Telemetry Medical [104]  May admit patient to [facility] or [facility] if equivalent level of care is available:: No  Covid Evaluation: Asymptomatic - no recent exposure (last 10 days) testing not required  Diagnosis: Acute cholecystitis [575.0.ICD-9-CM]  Admitting Physician: Tereasa Coop [[id number]]  Attending Physician: Tereasa Coop [[id number]]  Certification:: I certify this patient will need inpatient services for at least 2 midnights  Expected Medical Readiness: 01/31/2023          B Medical/Surgery History Past Medical History:  Diagnosis Date   Adenomatous colon polyp 2008   Anxiety    Asthma    has not needed in over a year   Complication of anesthesia    pt reports TIA and sezures after surgery   Coronary artery disease    CVA (cerebral vascular accident) (HCC)    x 3   Degenerative disc disease, lumbar    Depression     Diverticulosis    Dysrhythmia    GERD (gastroesophageal reflux disease)    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68   age 64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 2 weeks   Hemorrhoids 2007   History of kidney stones    HTN (hypertension)    Hyperplastic colon polyp 2005   Hypothyroidism    Narcolepsy    PONV (postoperative nausea and vomiting)    TIA (transient ischemic attack)    Past Surgical History:  Procedure Laterality Date   ABDOMINAL HYSTERECTOMY     age 55, complicated by poor wound healing, followed by revision of scar and radiation for ?malignancy   BACK SURGERY  07/2018   lost 2 inches   BREAST ENHANCEMENT SURGERY     BREAST IMPLANT EXCHANGE Right 06/23/2016   Procedure: RIGHT BREAST IMPLANT REMOVAL AND REPLACEMENT;  Surgeon: Louisa Second, MD;  Location: [facility];  Service: Plastics;  Laterality: Right;   COLONOSCOPY  2005   2 hyperplastic polyps   COLONOSCOPY  2007   Dr. Darrick Penna- hemorrhoids   COLONOSCOPY  2008   Dr. Steva Ready polyp- rare sigmoid diverticulosis, internal hemorrhoids   COLONOSCOPY  12/01/2011   Procedure: COLONOSCOPY;  Surgeon: West Bali, MD;  [facility];  Service: Endoscopy;  Laterality: N/A;  12:30 PM   COLONOSCOPY WITH PROPOFOL N/A 05/22/2022   Procedure: COLONOSCOPY WITH PROPOFOL;  Surgeon: Lanelle Bal, DO;  Location: [facility];  Service: Endoscopy;  Laterality: N/A;  1:00 PM   CYSTOSCOPY/URETEROSCOPY/HOLMIUM LASER/STENT PLACEMENT Left 02/29/2020   Procedure: CYSTOSCOPY/URETEROSCOPY/HOLMIUM LASER/STENT PLACEMENT;  Surgeon: Rene Paci, MD;  Location: [facility];  Service: Urology;  Laterality: Left;   epidural steroid injection     She is getting injections with Dr. Ethelene Hal q3wks.   ESOPHAGOGASTRODUODENOSCOPY (EGD) WITH ESOPHAGEAL DILATION N/A 04/15/2013   Procedure: ESOPHAGOGASTRODUODENOSCOPY (EGD) WITH ESOPHAGEAL DILATION;  Surgeon: West Bali, MD;  [facility];  Service: Endoscopy;  Laterality: N/A;   11:45-moved to 12:30 [name] to notify pt   EYE SURGERY  2010   EYE SURGERY     IR KYPHO LUMBAR INC FX REDUCE BONE BX UNI/BIL CANNULATION INC/IMAGING  08/23/2018   IR RADIOLOGIST EVAL & MGMT  12/23/2018   POLYPECTOMY  05/22/2022   Procedure: POLYPECTOMY;  Surgeon: Lanelle Bal, DO;  Location[facility];  Service: Endoscopy;;   right thumb surgery       A IV Location/Drains/Wounds Patient Lines/Drains/Airways Status     Active Line/Drains/Airways     Name Placement date Placement time Site Days   Peripheral IV 01/25/23 20 G Anterior;Right Forearm 01/25/23  2320  Forearm  1   Peripheral IV 01/25/23 20 G 1" Left;Posterior Forearm 01/25/23  2329  Forearm  1            Intake/Output Last 24 hours No intake or output data in the 24 hours ending 01/26/23 0443  Labs/Imaging Results for orders placed or performed during the hospital encounter of 01/25/23 (from the past 48 hour(s))  CBC     Status: Abnormal   Collection Time: 01/25/23 11:40 PM  Result Value Ref Range   WBC 4.6 4.0 - 10.5 K/uL   RBC 2.73 (L) 3.87 - 5.11 MIL/uL   Hemoglobin 8.1 (L) 12.0 - 15.0 g/dL   HCT 13.0 (L) 86.5 - 78.4 %   MCV 96.0 80.0 - 100.0 fL   MCH 29.7 26.0 - 34.0 pg   MCHC 30.9 30.0 - 36.0 g/dL   RDW 69.6 (H) 29.5 - 28.4 %   Platelets 145 (L) 150 - 400 K/uL    Comment: REPEATED TO VERIFY   nRBC 0.0 0.0 - 0.2 %    Comment: Performed at [facility], [address] [place], [place] [address]  Comprehensive metabolic panel     Status: Abnormal   Collection Time: 01/25/23 11:40 PM  Result Value Ref Range   Sodium 138 135 - 145 mmol/L   Potassium 4.8 3.5 - 5.1 mmol/L   Chloride 104 98 - 111 mmol/L   CO2 22 22 - 32 mmol/L   Glucose, Bld 104 (H) 70 - 99 mg/dL    Comment: Glucose reference range applies only to samples taken after fasting for at least 8 hours.   BUN 41 (H) 8 - 23 mg/dL   Creatinine, Ser 0.10 (H) 0.44 - 1.00 mg/dL   Calcium 8.9 8.9 - 27.2 mg/dL   Total Protein 5.8 (L)  6.5 - 8.1 g/dL   Albumin 3.5 3.5 - 5.0 g/dL   AST 57 (H) 15 - 41 U/L   ALT 38 0 - 44 U/L   Alkaline Phosphatase 58 38 - 126 U/L   Total Bilirubin 0.6 0.3 - 1.2 mg/dL   GFR, Estimated 18 (L) >60 mL/min    Comment: (NOTE) Calculated using the CKD-EPI Creatinine Equation (2021)    Anion gap 12 5 - 15    Comment: Performed at [facility], [address],  [place], [place] [address]  Lipase, blood     Status: Abnormal   Collection Time: 01/25/23 11:40 PM  Result Value Ref Range   Lipase 92 (H) 11 - 51 U/L    Comment: Performed at [facility], [address], [place], [place] [address]  Troponin I (High Sensitivity)     Status: None   Collection Time: 01/25/23 11:40 PM  Result Value Ref Range   Troponin I (High Sensitivity) 14 <18 ng/L    Comment: (NOTE) Elevated high sensitivity troponin I (hsTnI) values and significant  changes across serial measurements may suggest ACS but many other  chronic and acute conditions are known to elevate hsTnI results.  Refer to the "Links" section for chest pain algorithms and additional  guidance. Performed at [facility], [address] [place], [place] [address]   Magnesium     Status: Abnormal   Collection Time: 01/25/23 11:40 PM  Result Value Ref Range   Magnesium 2.6 (H) 1.7 - 2.4 mg/dL    Comment: Performed at [facility], [address] [place], [place] [address]  ABO/Rh     Status: None   Collection Time: 01/25/23 11:40 PM  Result Value Ref Range   ABO/RH(D)      A POS Performed at [facility], [address] [place], [place] [address]   Troponin I (High Sensitivity)     Status: None   Collection Time: 01/26/23  1:20 AM  Result Value Ref Range   Troponin I (High Sensitivity) 8 <18 ng/L    Comment: (NOTE) Elevated high sensitivity troponin I (hsTnI) values and significant  changes across serial measurements may suggest ACS but many other  chronic and acute conditions are known to elevate hsTnI  results.  Refer to the "Links" section for chest pain algorithms and additional  guidance. Performed at [facility], [address] [place], [place] [address]   POC occult blood, ED     Status: None   Collection Time: 01/26/23  1:49 AM  Result Value Ref Range   Fecal Occult Bld NEGATIVE NEGATIVE  Type and screen [facility]     Status: None   Collection Time: 01/26/23  2:12 AM  Result Value Ref Range   ABO/RH(D) A POS    Antibody Screen NEG    Sample Expiration      01/29/2023,2359 Performed at [facility], [address] [place], [place] [address]    US Abdomen Limited RUQ (LIVER/GB)  Result Date: 01/26/2023 CLINICAL DATA:  Right upper quadrant pain EXAM: ULTRASOUND ABDOMEN LIMITED RIGHT UPPER QUADRANT COMPARISON:  CT earlier today FINDINGS: Gallbladder: 6 mm stone in the gallbladder neck region. 9 mm polyp noted. Mild gallbladder wall thickening at 4 mm. Common bile duct: Diameter: Upper limits normal in size for patient's age measuring up to 7-8 mm. Liver: No focal lesion identified. Within normal limits in parenchymal echogenicity. Portal vein is patent on color Doppler imaging with normal direction of blood flow towards the liver. Other: None. IMPRESSION: Small gallstone. 9 mm gallbladder wall polyp. Mild gallbladder wall thickening. Recommend clinical correlation for possible early cholecystitis. Electronically Signed   By: Charlett Nose M.D.   On: 01/26/2023 03:11   CT ABDOMEN PELVIS WO CONTRAST  Result Date: 01/26/2023 CLINICAL DATA:  Abdominal pain, acute, to the right flank EXAM: CT ABDOMEN AND PELVIS WITHOUT CONTRAST CT LUMBAR SPINE WITHOUT CONTRAST TECHNIQUE: Multidetector CT imaging of the abdomen and  pelvis was performed following the standard protocol without IV contrast. Multidetector CT imaging of the lumbar spine was performed without intravenous contrast administration. Multiplanar CT image reconstructions were also generated. RADIATION DOSE  REDUCTION: This exam was performed according to the departmental dose-optimization program which includes automated exposure control, adjustment of the mA and/or kV according to patient size and/or use of iterative reconstruction technique. COMPARISON:  CT abdomen and pelvis 01/01/2023 FINDINGS: ABDOMEN/PELVIS: Lower chest: Cardiomegaly. Interlobular septal thickening in the lower lungs. Trace left effusion. Hepatobiliary: Small amount of pericholecystic fluid, slightly increased from prior. No radiopaque stone. Stable caliber biliary ducts. Scattered hepatic cysts. Pancreas: Unremarkable. Spleen: Unremarkable. Adrenals/Urinary Tract: Stable adrenal glands. Unchanged left nephrolithiasis. No obstructing ureteral calculi. No hydronephrosis. Interpolar right kidney 1.8 cm hyperdense hemorrhagic or proteinaceous cyst. No specific follow-up imaging recommended. Unremarkable bladder. Stomach/Bowel: Normal caliber large and small bowel. No bowel wall thickening. Stomach is within normal limits. The appendix is not visualized. Vascular/Lymphatic: Aortic atherosclerosis. No enlarged abdominal or pelvic lymph nodes. Reproductive: Hysterectomy.  No adnexal mass. Other: No free intraperitoneal fluid or air. Musculoskeletal: No acute fracture. LUMBAR SPINE: Segmentation: 5 lumbar type vertebrae. Alignment: Unchanged alignment from 01/01/2023. No evidence of traumatic listhesis. Vertebrae: No acute fracture. Chronic compression fracture of T12 status post vertebroplasty. Paraspinal and other soft tissues: Reported above. Disc levels: Multilevel spondylosis. Advanced disc space height loss and degenerative endplate changes at L5-S1. Advanced facet arthropathy throughout the lumbar spine. Posterior disc extrusion at L5-S1 causes narrowing of the left lateral recess mild spinal canal narrowing, similar to MRI 08/05/2021. IMPRESSION: 1. Small amount of pericholecystic fluid, slightly increased from prior. No radiopaque stone. If  there is clinical concern for acute cholecystitis, consider further evaluation with right upper quadrant ultrasound. 2. Unchanged left nephrolithiasis. No obstructing ureteral calculi. No hydronephrosis. 3. No acute fracture or traumatic listhesis in the lumbar spine. 4. Cardiomegaly with interlobular septal thickening in the lower lungs and trace left effusion, suggestive of pulmonary edema. Aortic Atherosclerosis (ICD10-I70.0). Electronically Signed   By: Minerva Fester M.D.   On: 01/26/2023 01:13   CT L-SPINE NO CHARGE  Result Date: 01/26/2023 CLINICAL DATA:  Abdominal pain, acute, to the right flank EXAM: CT ABDOMEN AND PELVIS WITHOUT CONTRAST CT LUMBAR SPINE WITHOUT CONTRAST TECHNIQUE: Multidetector CT imaging of the abdomen and pelvis was performed following the standard protocol without IV contrast. Multidetector CT imaging of the lumbar spine was performed without intravenous contrast administration. Multiplanar CT image reconstructions were also generated. RADIATION DOSE REDUCTION: This exam was performed according to the departmental dose-optimization program which includes automated exposure control, adjustment of the mA and/or kV according to patient size and/or use of iterative reconstruction technique. COMPARISON:  CT abdomen and pelvis 01/01/2023 FINDINGS: ABDOMEN/PELVIS: Lower chest: Cardiomegaly. Interlobular septal thickening in the lower lungs. Trace left effusion. Hepatobiliary: Small amount of pericholecystic fluid, slightly increased from prior. No radiopaque stone. Stable caliber biliary ducts. Scattered hepatic cysts. Pancreas: Unremarkable. Spleen: Unremarkable. Adrenals/Urinary Tract: Stable adrenal glands. Unchanged left nephrolithiasis. No obstructing ureteral calculi. No hydronephrosis. Interpolar right kidney 1.8 cm hyperdense hemorrhagic or proteinaceous cyst. No specific follow-up imaging recommended. Unremarkable bladder. Stomach/Bowel: Normal caliber large and small bowel. No  bowel wall thickening. Stomach is within normal limits. The appendix is not visualized. Vascular/Lymphatic: Aortic atherosclerosis. No enlarged abdominal or pelvic lymph nodes. Reproductive: Hysterectomy.  No adnexal mass. Other: No free intraperitoneal fluid or air. Musculoskeletal: No acute fracture. LUMBAR SPINE: Segmentation: 5 lumbar type vertebrae. Alignment: Unchanged alignment from 01/01/2023. No evidence of  traumatic listhesis. Vertebrae: No acute fracture. Chronic compression fracture of T12 status post vertebroplasty. Paraspinal and other soft tissues: Reported above. Disc levels: Multilevel spondylosis. Advanced disc space height loss and degenerative endplate changes at L5-S1. Advanced facet arthropathy throughout the lumbar spine. Posterior disc extrusion at L5-S1 causes narrowing of the left lateral recess mild spinal canal narrowing, similar to MRI 08/05/2021. IMPRESSION: 1. Small amount of pericholecystic fluid, slightly increased from prior. No radiopaque stone. If there is clinical concern for acute cholecystitis, consider further evaluation with right upper quadrant ultrasound. 2. Unchanged left nephrolithiasis. No obstructing ureteral calculi. No hydronephrosis. 3. No acute fracture or traumatic listhesis in the lumbar spine. 4. Cardiomegaly with interlobular septal thickening in the lower lungs and trace left effusion, suggestive of pulmonary edema. Aortic Atherosclerosis (ICD10-I70.0). Electronically Signed   By: Minerva Fester M.D.   On: 01/26/2023 01:13   DG Chest Port 1 View  Result Date: 01/25/2023 CLINICAL DATA:  cp EXAM: PORTABLE CHEST 1 VIEW COMPARISON:  CT angio chest 01/01/2023, chest x-ray 824 FINDINGS: Cardiac paddles overlie the chest. The heart and mediastinal contours are unchanged. Aortic calcification. No focal consolidation. Chronic coarsened interstitial markings with no overt pulmonary edema. No pleural effusion. No pneumothorax. No acute osseous abnormality. IMPRESSION:  No active disease. Electronically Signed   By: Tish Frederickson M.D.   On: 01/25/2023 23:36    Pending Labs Unresulted Labs (From admission, onward)     Start     Ordered   01/27/23 0500  CBC  Daily,   R      01/26/23 0348   01/26/23 0500  Comprehensive metabolic panel  Daily,   R      01/26/23 0348   01/25/23 2355  Protime-INR  Once,   STAT        01/25/23 2355   01/25/23 2312  Urinalysis, Routine w reflex microscopic -Urine, Clean Catch  Once,   URGENT       Question:  Specimen Source  Answer:  Urine, Clean Catch   01/25/23 2311            Vitals/Pain Today's Vitals   01/26/23 0215 01/26/23 0245 01/26/23 0300 01/26/23 0430  BP: 110/75 (!) 109/51 120/86 112/61  Pulse: 61 63 62 61  Resp: 16 (!) 22 19 20   Temp:      TempSrc:      SpO2: 100% 95% 96% 96%  PainSc:        Isolation Precautions No active isolations  Medications Medications  amLODipine (NORVASC) tablet 10 mg (has no administration in time range)  ezetimibe (ZETIA) tablet 10 mg (has no administration in time range)  losartan (COZAAR) tablet 25 mg (has no administration in time range)  memantine (NAMENDA) tablet 10 mg (has no administration in time range)  levothyroxine (SYNTHROID) tablet 75 mcg (has no administration in time range)  pantoprazole (PROTONIX) EC tablet 40 mg (has no administration in time range)  Iron TABS 1 tablet (has no administration in time range)  albuterol (PROVENTIL) (2.5 MG/3ML) 0.083% nebulizer solution 3 mL (has no administration in time range)  cefTRIAXone (ROCEPHIN) 2 g in sodium chloride 0.9 % 100 mL IVPB (has no administration in time range)  metroNIDAZOLE (FLAGYL) IVPB 500 mg (has no administration in time range)  dextrose 5 % in lactated ringers infusion (has no administration in time range)  sodium chloride flush (NS) 0.9 % injection 3 mL (has no administration in time range)  sodium chloride flush (NS) 0.9 % injection 3 mL (has  no administration in time range)  0.9 %  sodium  chloride infusion (has no administration in time range)  ondansetron (ZOFRAN) tablet 4 mg (has no administration in time range)    Or  ondansetron (ZOFRAN) injection 4 mg (has no administration in time range)  senna-docusate (Senokot-S) tablet 1 tablet (has no administration in time range)  flecainide (TAMBOCOR) tablet 50 mg (has no administration in time range)  morphine (PF) 4 MG/ML injection 4 mg (4 mg Intravenous Given 01/25/23 2320)  ondansetron (ZOFRAN) injection 4 mg (4 mg Intravenous Given 01/25/23 2337)  sodium chloride 0.9 % bolus 500 mL (500 mLs Intravenous Bolus 01/26/23 0229)    Mobility walks with device     Focused Assessments Cardiac Assessment Handoff:  Cardiac Rhythm: Sinus bradycardia (Heart rate in the 30's reportedly) No results found for: "CKTOTAL", "CKMB", "CKMBINDEX", "TROPONINI" Lab Results  Component Value Date   DDIMER 0.29 01/14/2023   Does the Patient currently have chest pain? No    R Recommendations: See Admitting Provider Note  Report given to:   Additional Notes:

## 2023-01-26 NOTE — Plan of Care (Signed)
  Problem: Clinical Measurements: Goal: Diagnostic test results will improve Outcome: Not Progressing   Problem: Clinical Measurements: Goal: Will remain free from infection Outcome: Not Progressing   Problem: Coping: Goal: Level of anxiety will decrease Outcome: Not Progressing   Problem: Safety: Goal: Ability to remain free from injury will improve Outcome: Not Progressing   Problem: Pain Managment: Goal: General experience of comfort will improve Outcome: Not Progressing

## 2023-01-26 NOTE — H&P (Addendum)
History and Physical    [name]:[id number] DOB: January 20, 1936 DOA: 01/25/2023  PCP: Benita Stabile, MD   Patient coming from: Home   Chief Complaint:  Chief Complaint  Patient presents with   Abdominal Pain    HPI:  [name] is a 87 y.o. female with medical history CKD stage IV, significant of essential hypertension, paroxysmal atrial fibrillation on Eliquis 2.5 mg twice daily, hypothyroidism, hyperlipidemia, history of CVA/TIA, and chronic anemia presented to emergency department for evaluation for abdominal pain.  Patient reported some abdominal pain shortness of breath at home.  She also had found to bradycardia with 1 episode of atrial fibrillation heart rate up to 120. During my evaluation patient is complaining about right upper quadrant abdominal pain 7 out of 10 intensity.  Patient denies any nausea, vomiting, fever and chills.  Denies any constipation and diarrhea.  Reported abdominal pain get worse after eating mostly solid food.  Part chart review patient has been recently admitted on 01/02/2023 for symptomatic bradycardia during that time metoprolol has been discontinued.  Also she had some abdominal discomfort and CT abdomen pelvis did not showed any acute abdominal finding.  Per chart review patient sees outpatient GI with [facility] gastro[facility]ology [facility] last seen on 12/18/2022.  She has history of GERD, colonic polyp recent colonoscopy in December 2023 showed scattered adenoma.  Patient also has chronic bloating likely related to kyphosis of her back.  EGD offered patient however she declined to outpatient GI.   ED Course:  At initial presentation to ED heart rate 58, respiratory rate 18, blood pressure 190/71 and O2 sat 98% room air.  CBC showed WBC 4.6, RBC 2.73, hemoglobin 8.1 (baseline hemoglobin around 13), hematocrit 26, and platelet count 145.  CMP sodium 138, potassium 4.8, chloride 108, bicarb 22, blood glucose 104, BUN 41, elevated  creatinine 2.53, calcium 8.9, protein 5.8, AST 57, ALT 38 and GFR 18. Elevated lipase 92. Troponin 14.EKG showing sinus bradycardia heart rate 51.  Prolonged PR interval. Elevated mag 2.6. In the ED type and screen has been ordered due to low hemoglobin 8.1.  CT abdomen and pelvis: IMPRESSION: 1. Small amount of pericholecystic fluid, slightly increased from prior. No radiopaque stone. If there is clinical concern for acute cholecystitis, consider further evaluation with right upper quadrant ultrasound. 2. Unchanged left nephrolithiasis. No obstructing ureteral calculi. No hydronephrosis. 3. No acute fracture or traumatic listhesis in the lumbar spine. 4. Cardiomegaly with interlobular septal thickening in the lower lungs and trace left effusion, suggestive of pulmonary edema.  CT lumbar spine: No acute fracture or traumatic listhesis in the lumbar spine.   ED physician Dr. Virgel Bouquet reported that patient hemoglobin was 13-14 however as there is sudden drop of hemoglobin to 8.1 he is concerned for some unknown source of bleeding.  Requested admission for anemia workup.  Review of Systems:  Review of Systems  Constitutional:  Negative for chills, fever, malaise/fatigue and weight loss.  HENT:  Positive for hearing loss.   Respiratory:  Negative for cough, hemoptysis, sputum production and shortness of breath.   Cardiovascular:  Negative for chest pain, palpitations, claudication and leg swelling.  Gastrointestinal:  Positive for abdominal pain. Negative for blood in stool, constipation, diarrhea, heartburn, nausea and vomiting.  Genitourinary:  Negative for dysuria and urgency.  Musculoskeletal:  Negative for back pain, myalgias and neck pain.  Neurological:  Negative for dizziness and headaches.  Psychiatric/Behavioral:  The patient is not nervous/anxious.     Past Medical History:  Diagnosis Date   Adenomatous colon polyp 2008   Anxiety    Asthma    has not needed in over a year    Complication of anesthesia    pt reports TIA and sezures after surgery   Coronary artery disease    CVA (cerebral vascular accident) (HCC)    x 3   Degenerative disc disease, lumbar    Depression    Diverticulosis    Dysrhythmia    GERD (gastroesophageal reflux disease)    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75   age 51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 2 weeks   Hemorrhoids 2007   History of kidney stones    HTN (hypertension)    Hyperplastic colon polyp 2005   Hypothyroidism    Narcolepsy    PONV (postoperative nausea and vomiting)    TIA (transient ischemic attack)     Past Surgical History:  Procedure Laterality Date   ABDOMINAL HYSTERECTOMY     age 43, complicated by poor wound healing, followed by revision of scar and radiation for ?malignancy   BACK SURGERY  07/2018   lost 2 inches   BREAST ENHANCEMENT SURGERY     BREAST IMPLANT EXCHANGE Right 06/23/2016   Procedure: RIGHT BREAST IMPLANT REMOVAL AND REPLACEMENT;  Surgeon: Louisa Second, MD;  Location: [facility];  Service: Plastics;  Laterality: Right;   COLONOSCOPY  2005   2 hyperplastic polyps   COLONOSCOPY  2007   Dr. Darrick Penna- hemorrhoids   COLONOSCOPY  2008   Dr. Steva Ready polyp- rare sigmoid diverticulosis, internal hemorrhoids   COLONOSCOPY  12/01/2011   Procedure: COLONOSCOPY;  Surgeon: West Bali, MD;  Location: [facility];  Service: Endoscopy;  Laterality: N/A;  12:30 PM   COLONOSCOPY WITH PROPOFOL N/A 05/22/2022   Procedure: COLONOSCOPY WITH PROPOFOL;  Surgeon: Lanelle Bal, DO;  Location: [facility];  Service: Endoscopy;  Laterality: N/A;  1:00 PM   CYSTOSCOPY/URETEROSCOPY/HOLMIUM LASER/STENT PLACEMENT Left 02/29/2020   Procedure: CYSTOSCOPY/URETEROSCOPY/HOLMIUM LASER/STENT PLACEMENT;  Surgeon: Rene Paci, MD;  Location: [facility];  Service: Urology;  Laterality: Left;   epidural steroid injection     She is getting injections with Dr. Ethelene Hal q3wks.   ESOPHAGOGASTRODUODENOSCOPY (EGD)  WITH ESOPHAGEAL DILATION N/A 04/15/2013   Procedure: ESOPHAGOGASTRODUODENOSCOPY (EGD) WITH ESOPHAGEAL DILATION;  Surgeon: West Bali, MD;  Location: [facility];  Service: Endoscopy;  Laterality: N/A;  11:45-moved to 12:30 [name] to notify pt   EYE SURGERY  2010   EYE SURGERY     IR KYPHO LUMBAR INC FX REDUCE BONE BX UNI/BIL CANNULATION INC/IMAGING  08/23/2018   IR RADIOLOGIST EVAL & MGMT  12/23/2018   POLYPECTOMY  05/22/2022   Procedure: POLYPECTOMY;  Surgeon: Lanelle Bal, DO;  Location: [facility];  Service: Endoscopy;;   right thumb surgery       reports that she has never smoked. She has never been exposed to tobacco smoke. She has never used smokeless tobacco. She reports that she does not drink alcohol and does not use drugs.  Allergies  Allergen Reactions   Epinephrine Anaphylaxis   Demerol [Meperidine] Other (See Comments)    headaches   Morphine Sulfate Anxiety    Patient states it messes with her mind makes her narcoleptic   Vicodin [Hydrocodone-Acetaminophen] Other (See Comments)    York Spaniel it makes her crazy. Pt tolerates acetaminophen    Family History  Problem Relation Age of Onset   Colon cancer Father  age 71   Prostate cancer Father    Pancreatic cancer Mother        age 48    Prior to Admission medications   Medication Sig Start Date End Date Taking? Authorizing Provider  albuterol (VENTOLIN HFA) 108 (90 Base) MCG/ACT inhaler Inhale 2 puffs into the lungs every 6 (six) hours as needed for wheezing or shortness of breath. 01/02/23   Shon Hale, MD  amLODipine (NORVASC) 10 MG tablet Take 1 tablet (10 mg total) by mouth daily. For BP 01/02/23 01/02/24  Shon Hale, MD  apixaban (ELIQUIS) 2.5 MG TABS tablet Take 1 tablet (2.5 mg total) by mouth 2 (two) times daily. 01/02/23   Shon Hale, MD  Ascorbic Acid (VITAMIN C) 1000 MG tablet Take 1,000 mg by mouth daily.    [provider]  B Complex Vitamins (VITAMIN B COMPLEX) TABS  Take 1 tablet by mouth daily.    [provider]  Calcium Carb-Cholecalciferol (CALCIUM 600 + D PO) Take 1 tablet by mouth in the morning and at bedtime.    [provider]  ezetimibe (ZETIA) 10 MG tablet Take 1 tablet (10 mg total) by mouth daily. 09/08/18   Sharee Holster, NP  Ferrous Gluconate (IRON) 240 (27 Fe) MG TABS Take 1 tablet by mouth daily. 09/08/18   Sharee Holster, NP  flecainide (TAMBOCOR) 50 MG tablet TAKE 1 TABLET(50 MG) BY MOUTH TWICE DAILY 02/11/22   Marinus Maw, MD  furosemide (LASIX) 40 MG tablet Take 1 tablet (40 mg total) by mouth daily. 01/03/23   Shon Hale, MD  levothyroxine (SYNTHROID) 75 MCG tablet Take 75 mcg by mouth daily before breakfast.    [provider]  lidocaine (LIDODERM) 5 % Place 1 patch onto the skin daily. Remove & Discard patch within 12 hours or as directed by MD 01/14/23   Burgess Amor, PA-C  losartan (COZAAR) 25 MG tablet Take 25 mg by mouth daily.    [provider]  Magnesium 250 MG TABS Take 250 mg by mouth daily.     [provider]  memantine (NAMENDA) 10 MG tablet Take 1 tablet (10 mg total) by mouth 2 (two) times daily. 12/09/22   Glean Salvo, NP  Omega-3 Fatty Acids (FISH OIL) 1200 MG CAPS Take 1,200 mg by mouth daily.    [provider]  pantoprazole (PROTONIX) 40 MG tablet Take 1 tablet (40 mg total) by mouth daily. 01/02/23   Shon Hale, MD  Potassium Chloride ER 20 MEQ TBCR Take 1 tablet (20 mEq total) by mouth daily. 1 tab daily by mouth--- take while taking Lasix/furosemide 01/02/23   Emokpae, Courage, MD  senna-docusate (SENOKOT-S) 8.6-50 MG tablet Take 2 tablets by mouth at bedtime. 01/02/23   Shon Hale, MD  Vitamin D, Cholecalciferol, 25 MCG (1000 UT) TABS Take 1,000 Int'l Units/1.66m2 by mouth daily at 6 (six) AM.    [provider]  vitamin E 180 MG (400 UNITS) capsule Take 400 Units by mouth daily.    [provider]     Physical Exam: Vitals:    01/26/23 0430 01/26/23 0446 01/26/23 0522 01/26/23 0528  BP: 112/61   (!) 123/58  Pulse: 61   62  Resp: 20   20  Temp:  97.8 F (36.6 C)  98 F (36.7 C)  TempSrc:  Oral  Oral  SpO2: 96%   100%  Weight:   62.9 kg     Physical Exam HENT:  Head: Normocephalic.  Cardiovascular:     Rate and Rhythm: Regular rhythm. Bradycardia present.     Heart sounds: Normal heart sounds.  Pulmonary:     Effort: Pulmonary effort is normal.     Breath sounds: Normal breath sounds.  Abdominal:     General: Abdomen is flat. Bowel sounds are normal. There is no distension.     Palpations: Abdomen is soft. There is no hepatomegaly or splenomegaly.     Tenderness: There is abdominal tenderness in the right upper quadrant. There is no guarding or rebound. Positive signs include Murphy's sign.     Hernia: There is no hernia in the umbilical area.  Skin:    General: Skin is dry.     Capillary Refill: Capillary refill takes less than 2 seconds.  Neurological:     Mental Status: She is alert and oriented to person, place, and time.  Psychiatric:        Mood and Affect: Mood normal. Mood is not anxious.      Labs on Admission: I have personally reviewed following labs and imaging studies  CBC: Recent Labs  Lab 01/25/23 2340  WBC 4.6  HGB 8.1*  HCT 26.2*  MCV 96.0  PLT 145*   Basic Metabolic Panel: Recent Labs  Lab 01/25/23 2340  NA 138  K 4.8  CL 104  CO2 22  GLUCOSE 104*  BUN 41*  CREATININE 2.53*  CALCIUM 8.9  MG 2.6*   GFR: Estimated Creatinine Clearance: 13.5 mL/min (A) (by C-G formula based on SCr of 2.53 mg/dL (H)). Liver Function Tests: Recent Labs  Lab 01/25/23 2340  AST 57*  ALT 38  ALKPHOS 58  BILITOT 0.6  PROT 5.8*  ALBUMIN 3.5   Recent Labs  Lab 01/25/23 2340  LIPASE 92*   No results for input(s): "AMMONIA" in the last 168 hours. Coagulation Profile: No results for input(s): "INR", "PROTIME" in the last 168 hours. Cardiac Enzymes: Recent Labs   Lab 01/25/23 2340 01/26/23 0120  TROPONINIHS 14 8   BNP (last 3 results) No results for input(s): "BNP" in the last 8760 hours. HbA1C: No results for input(s): "HGBA1C" in the last 72 hours. CBG: No results for input(s): "GLUCAP" in the last 168 hours. Lipid Profile: No results for input(s): "CHOL", "HDL", "LDLCALC", "TRIG", "CHOLHDL", "LDLDIRECT" in the last 72 hours. Thyroid Function Tests: No results for input(s): "TSH", "T4TOTAL", "FREET4", "T3FREE", "THYROIDAB" in the last 72 hours. Anemia Panel: No results for input(s): "VITAMINB12", "FOLATE", "FERRITIN", "TIBC", "IRON", "RETICCTPCT" in the last 72 hours. Urine analysis:    Component Value Date/Time   COLORURINE YELLOW 01/14/2023 1945   APPEARANCEUR CLEAR 01/14/2023 1945   LABSPEC 1.006 01/14/2023 1945   PHURINE 8.0 01/14/2023 1945   GLUCOSEU NEGATIVE 01/14/2023 1945   HGBUR NEGATIVE 01/14/2023 1945   BILIRUBINUR NEGATIVE 01/14/2023 1945   BILIRUBINUR neg 12/09/2019 0925   KETONESUR NEGATIVE 01/14/2023 1945   PROTEINUR NEGATIVE 01/14/2023 1945   UROBILINOGEN negative (A) 12/09/2019 0925   NITRITE NEGATIVE 01/14/2023 1945   LEUKOCYTESUR NEGATIVE 01/14/2023 1945    Radiological Exams on Admission: I have personally reviewed images US Abdomen Limited RUQ (LIVER/GB)  Result Date: 01/26/2023 CLINICAL DATA:  Right upper quadrant pain EXAM: ULTRASOUND ABDOMEN LIMITED RIGHT UPPER QUADRANT COMPARISON:  CT earlier today FINDINGS: Gallbladder: 6 mm stone in the gallbladder neck region. 9 mm polyp noted. Mild gallbladder wall thickening at 4 mm. Common bile duct: Diameter: Upper limits normal in size for patient's age measuring up to 7-8  mm. Liver: No focal lesion identified. Within normal limits in parenchymal echogenicity. Portal vein is patent on color Doppler imaging with normal direction of blood flow towards the liver. Other: None. IMPRESSION: Small gallstone. 9 mm gallbladder wall polyp. Mild gallbladder wall thickening.  Recommend clinical correlation for possible early cholecystitis. Electronically Signed   By: Charlett Nose M.D.   On: 01/26/2023 03:11   CT ABDOMEN PELVIS WO CONTRAST  Result Date: 01/26/2023 CLINICAL DATA:  Abdominal pain, acute, to the right flank EXAM: CT ABDOMEN AND PELVIS WITHOUT CONTRAST CT LUMBAR SPINE WITHOUT CONTRAST TECHNIQUE: Multidetector CT imaging of the abdomen and pelvis was performed following the standard protocol without IV contrast. Multidetector CT imaging of the lumbar spine was performed without intravenous contrast administration. Multiplanar CT image reconstructions were also generated. RADIATION DOSE REDUCTION: This exam was performed according to the departmental dose-optimization program which includes automated exposure control, adjustment of the mA and/or kV according to patient size and/or use of iterative reconstruction technique. COMPARISON:  CT abdomen and pelvis 01/01/2023 FINDINGS: ABDOMEN/PELVIS: Lower chest: Cardiomegaly. Interlobular septal thickening in the lower lungs. Trace left effusion. Hepatobiliary: Small amount of pericholecystic fluid, slightly increased from prior. No radiopaque stone. Stable caliber biliary ducts. Scattered hepatic cysts. Pancreas: Unremarkable. Spleen: Unremarkable. Adrenals/Urinary Tract: Stable adrenal glands. Unchanged left nephrolithiasis. No obstructing ureteral calculi. No hydronephrosis. Interpolar right kidney 1.8 cm hyperdense hemorrhagic or proteinaceous cyst. No specific follow-up imaging recommended. Unremarkable bladder. Stomach/Bowel: Normal caliber large and small bowel. No bowel wall thickening. Stomach is within normal limits. The appendix is not visualized. Vascular/Lymphatic: Aortic atherosclerosis. No enlarged abdominal or pelvic lymph nodes. Reproductive: Hysterectomy.  No adnexal mass. Other: No free intraperitoneal fluid or air. Musculoskeletal: No acute fracture. LUMBAR SPINE: Segmentation: 5 lumbar type vertebrae.  Alignment: Unchanged alignment from 01/01/2023. No evidence of traumatic listhesis. Vertebrae: No acute fracture. Chronic compression fracture of T12 status post vertebroplasty. Paraspinal and other soft tissues: Reported above. Disc levels: Multilevel spondylosis. Advanced disc space height loss and degenerative endplate changes at L5-S1. Advanced facet arthropathy throughout the lumbar spine. Posterior disc extrusion at L5-S1 causes narrowing of the left lateral recess mild spinal canal narrowing, similar to MRI 08/05/2021. IMPRESSION: 1. Small amount of pericholecystic fluid, slightly increased from prior. No radiopaque stone. If there is clinical concern for acute cholecystitis, consider further evaluation with right upper quadrant ultrasound. 2. Unchanged left nephrolithiasis. No obstructing ureteral calculi. No hydronephrosis. 3. No acute fracture or traumatic listhesis in the lumbar spine. 4. Cardiomegaly with interlobular septal thickening in the lower lungs and trace left effusion, suggestive of pulmonary edema. Aortic Atherosclerosis (ICD10-I70.0). Electronically Signed   By: Minerva Fester M.D.   On: 01/26/2023 01:13   CT L-SPINE NO CHARGE  Result Date: 01/26/2023 CLINICAL DATA:  Abdominal pain, acute, to the right flank EXAM: CT ABDOMEN AND PELVIS WITHOUT CONTRAST CT LUMBAR SPINE WITHOUT CONTRAST TECHNIQUE: Multidetector CT imaging of the abdomen and pelvis was performed following the standard protocol without IV contrast. Multidetector CT imaging of the lumbar spine was performed without intravenous contrast administration. Multiplanar CT image reconstructions were also generated. RADIATION DOSE REDUCTION: This exam was performed according to the departmental dose-optimization program which includes automated exposure control, adjustment of the mA and/or kV according to patient size and/or use of iterative reconstruction technique. COMPARISON:  CT abdomen and pelvis 01/01/2023 FINDINGS:  ABDOMEN/PELVIS: Lower chest: Cardiomegaly. Interlobular septal thickening in the lower lungs. Trace left effusion. Hepatobiliary: Small amount of pericholecystic fluid, slightly increased from prior. No radiopaque stone.  Stable caliber biliary ducts. Scattered hepatic cysts. Pancreas: Unremarkable. Spleen: Unremarkable. Adrenals/Urinary Tract: Stable adrenal glands. Unchanged left nephrolithiasis. No obstructing ureteral calculi. No hydronephrosis. Interpolar right kidney 1.8 cm hyperdense hemorrhagic or proteinaceous cyst. No specific follow-up imaging recommended. Unremarkable bladder. Stomach/Bowel: Normal caliber large and small bowel. No bowel wall thickening. Stomach is within normal limits. The appendix is not visualized. Vascular/Lymphatic: Aortic atherosclerosis. No enlarged abdominal or pelvic lymph nodes. Reproductive: Hysterectomy.  No adnexal mass. Other: No free intraperitoneal fluid or air. Musculoskeletal: No acute fracture. LUMBAR SPINE: Segmentation: 5 lumbar type vertebrae. Alignment: Unchanged alignment from 01/01/2023. No evidence of traumatic listhesis. Vertebrae: No acute fracture. Chronic compression fracture of T12 status post vertebroplasty. Paraspinal and other soft tissues: Reported above. Disc levels: Multilevel spondylosis. Advanced disc space height loss and degenerative endplate changes at L5-S1. Advanced facet arthropathy throughout the lumbar spine. Posterior disc extrusion at L5-S1 causes narrowing of the left lateral recess mild spinal canal narrowing, similar to MRI 08/05/2021. IMPRESSION: 1. Small amount of pericholecystic fluid, slightly increased from prior. No radiopaque stone. If there is clinical concern for acute cholecystitis, consider further evaluation with right upper quadrant ultrasound. 2. Unchanged left nephrolithiasis. No obstructing ureteral calculi. No hydronephrosis. 3. No acute fracture or traumatic listhesis in the lumbar spine. 4. Cardiomegaly with  interlobular septal thickening in the lower lungs and trace left effusion, suggestive of pulmonary edema. Aortic Atherosclerosis (ICD10-I70.0). Electronically Signed   By: Minerva Fester M.D.   On: 01/26/2023 01:13   DG Chest Port 1 View  Result Date: 01/25/2023 CLINICAL DATA:  cp EXAM: PORTABLE CHEST 1 VIEW COMPARISON:  CT angio chest 01/01/2023, chest x-ray 824 FINDINGS: Cardiac paddles overlie the chest. The heart and mediastinal contours are unchanged. Aortic calcification. No focal consolidation. Chronic coarsened interstitial markings with no overt pulmonary edema. No pleural effusion. No pneumothorax. No acute osseous abnormality. IMPRESSION: No active disease. Electronically Signed   By: Tish Frederickson M.D.   On: 01/25/2023 23:36    EKG: My personal interpretation of EKG shows: EKG showing sinus bradycardia heart rate 51.  Prolonged PR interval.  CBC showed WBC 4.6, RBC 2.73, hemoglobin 8.1 (baseline hemoglobin around 13), hematocrit 26, and platelet count 145.  CMP sodium 138, potassium 4.8, chloride 108, bicarb 22, blood glucose 104, BUN 41, elevated creatinine 2.53, calcium 8.9, protein 5.8, AST 57, ALT 38 and GFR 18. Elevated lipase 92. Troponin 14.EKG showing sinus bradycardia heart rate 51.  Prolonged PR interval. Elevated mag 2.6. In the ED type and screen has been ordered due to low hemoglobin 8.1.   Assessment/Plan: Principal Problem:   Acute cholecystitis Active Problems:   Acute anemia   Hypothyroidism   History of CVA (cerebrovascular accident)   Hyperlipidemia   Memory impairment   CKD (chronic kidney disease), stage IV (HCC)   Acute kidney injury superimposed on CKD (HCC)   Atrial fibrillation, chronic (HCC)   Chronic back pain due to kyphosis    Assessment and Plan: Acute cholecystitis Cholelithiasis -Patient presenting with acute on chronic development of right-sided upper quadrant abdominal pain.  Patient reported she has chronic abdominal discomfort and  pain however recently right upper quadrant abdominal pain has been getting worse.  Denies any associated nausea, vomiting, fever and chills. - Chronically elevated lipase 92. - CT abdomen pelvis showed small amount of pericholecystic fluid collection that has been increased to prior and concern for acute cholecystitis. -Obtained RUQ which showed gallstone 9 mm gallbladder wall polyp, mild gallbladder wall thickening possible early cholecystitis. -  Per chart review hepatic panel from 01/14/2023 normal AST, ALT alkaline phosphatase and bilirubin level. -Patient is afebrile and no evidence of leukocytosis. -Continue treating with empirically ceftriaxone and metronidazole. -Obtaining blood culture. - Consulted and reached out to general surgery Dr. Ivar Drape for further evaluation and recommendation for acute cholecystitis.  General surgery evaluate patient soon - Keeping patient n.p.o. - Continue maintenance fluid D5 LR - Continue Zofran as needed   Acute anemia -CBC today showed sudden drop of RBC to 2.73 and hemoglobin dropped 13-8.1.  Per chart review patient's hemoglobin baseline around 13-14 however today it showed sudden drop.  There is no external known source of bleeding. - Fecal occult blood test is negative. - Reached out to on-call GI Dr. Marina Goodell, per GI given patient has negative fecal occult stool blood test it is not a GI source of bleeding.  Recommended to check CBC back again to make sure it is not real drop of hemoglobin and further management of acute cholecystitis recommended to be consult general surgery. - Unclear etiology of anemia at this time - Patient has a show atrial fibrillation and takes Eliquis 2.5 mg twice daily.  Holding Eliquis. -Checking stat CBC.  If CBC showed persistent low hemoglobin in that case possibly need to reach out to hematology for further recommendation. - Doing type and screen.   Asymptomatic sinus bradycardia -Patient has history of atrial  fibrillation.  However patient is complaining about development of some low heart rate.  EKG showing sinus bradycardia heart rate 58. - Continue to monitor. - Reviewed home medication patient is on flecainide 50 mg twice daily which could be contributing to sinus bradycardia.  Patient is also on amlodipine dihydropyridine calcium channel blocker with does not cause bradycardia.  -Normal troponin level. -Checking TSH level. -Obtaining echocardiogram - Continue cardiac monitoring.  AKI on CKD stage 3B -Elevated creatinine 2.53.  Patient baseline creatinine is around 2 and GFR 27 to 40. - Acute kidney injury likely secondary to poor oral intake, dehydration and sudden drop of hemoglobin and possible internal bleeding? -Continue gentle hydration with D5 LR 100 cc/h. -Continue to monitor urine output - Continue to monitor renal function - Avoid nephrotoxic agent and renally dose medication.  History of atrial fibrillation -Currently EKG did not show any evidence of atrial fibrillation. - At home patient takes Eliquis 2.5 mg twice daily.  In the setting of sudden drop of  hemoglobin holding Eliquis. -Holding flecainide in the setting of bradycardia.  Grade 3 diastolic heart failure with preserved EF 60-65 Essential hypertension Per chart review previous echocardiogram from 02/2022 showed grade 3 diastolic heart failure and preserved ejection fraction of 60 to 65%. - CT abdomen showed cardiomegaly with intra lobular septal thickening in the lower lung and trace left effusion suggestive of pulmonary edema. -Chest x-ray no active disease process. - Continue amlodipine 10 mg daily, losartan 25 mg daily. -Blood pressure is well-controlled. - Continue amlodipine - Holding Lasix and losartan in the setting of acute kidney injury.   Hypothyroidism -Continue levothyroxine 75 mcg daily - Checking TSH level   History of CVA Hyperlipidemia -Continue ezetimibe 10 mg daily.  Chronic  anemia Chronic anemia secondary to CKD stage IIIb - Continue oral iron supplement daily  Memory impairment - Continue memantine 10 mg twice daily   DVT prophylaxis:  SCDs.  Hold pharmacological prophylaxis in the setting of low hemoglobin Code Status:  Full Code.  Verified with patient. Diet: Currently n.p.o. for possible surgical intervention due to acute  cholecystitis. Family Communication: Unable to reach patient brother over the phone currently. Disposition Plan: Pending improvement of acute cholecystitis and hemoglobin.  Pending general surgery evaluation.  Tentative discharge to home next 3 to 4 days. Consults: Gastroenterology and general surgery Admission status:   Inpatient, medical-Telemetry bed  Severity of Illness: The appropriate patient status for this patient is INPATIENT. Inpatient status is judged to be reasonable and necessary in order to provide the required intensity of service to ensure the patient's safety. The patient's presenting symptoms, physical exam findings, and initial radiographic and laboratory data in the context of their chronic comorbidities is felt to place them at high risk for further clinical deterioration. Furthermore, it is not anticipated that the patient will be medically stable for discharge from the hospital within 2 midnights of admission.   * I certify that at the point of admission it is my clinical judgment that the patient will require inpatient hospital care spanning beyond 2 midnights from the point of admission due to high intensity of service, high risk for further deterioration and high frequency of surveillance required.Marland Kitchen    Tereasa Coop, MD [facility]  How to contact the [facility] Attending or Consulting provider 7A - 7P or covering provider during after hours 7P -7A, for this patient.  Check the care team in [facility] and look for a) attending/consulting [facility] provider listed and b) the [facility] team listed Log into [email or web address] and use  [place]'s universal password to access. If you do not have the password, please contact the hospital operator. Locate the [facility] provider you are looking for under [facility] and page to a number that you can be directly reached. If you still have difficulty reaching the provider, please page the [facility] (Director on Call) for the Hospitalists listed on amion for assistance.  01/26/2023, 5:48 AM

## 2023-01-27 ENCOUNTER — Inpatient Hospital Stay (HOSPITAL_COMMUNITY): Payer: PPO

## 2023-01-27 DIAGNOSIS — K810 Acute cholecystitis: Secondary | ICD-10-CM | POA: Diagnosis not present

## 2023-01-27 DIAGNOSIS — R001 Bradycardia, unspecified: Secondary | ICD-10-CM

## 2023-01-27 DIAGNOSIS — I495 Sick sinus syndrome: Secondary | ICD-10-CM | POA: Diagnosis not present

## 2023-01-27 LAB — SURGICAL PCR SCREEN
MRSA, PCR: NEGATIVE
Staphylococcus aureus: NEGATIVE

## 2023-01-27 LAB — ECHOCARDIOGRAM COMPLETE
AR max vel: 2.03 cm2
AV Area VTI: 1.93 cm2
AV Area mean vel: 2.02 cm2
AV Mean grad: 7.5 mmHg
AV Peak grad: 12.9 mmHg
Ao pk vel: 1.8 m/s
Area-P 1/2: 4.06 cm2
Height: 59 in
MV M vel: 3.6 m/s
MV Peak grad: 51.8 mmHg
MV VTI: 1.87 cm2
S' Lateral: 2.5 cm
Weight: 2218.71 [oz_av]

## 2023-01-27 LAB — CBC
HCT: 33.7 % — ABNORMAL LOW (ref 36.0–46.0)
Hemoglobin: 10.8 g/dL — ABNORMAL LOW (ref 12.0–15.0)
MCH: 29.9 pg (ref 26.0–34.0)
MCHC: 32 g/dL (ref 30.0–36.0)
MCV: 93.4 fL (ref 80.0–100.0)
Platelets: 213 10*3/uL (ref 150–400)
RBC: 3.61 MIL/uL — ABNORMAL LOW (ref 3.87–5.11)
RDW: 15.9 % — ABNORMAL HIGH (ref 11.5–15.5)
WBC: 6.1 10*3/uL (ref 4.0–10.5)
nRBC: 0 % (ref 0.0–0.2)

## 2023-01-27 LAB — COMPREHENSIVE METABOLIC PANEL
ALT: 47 U/L — ABNORMAL HIGH (ref 0–44)
AST: 46 U/L — ABNORMAL HIGH (ref 15–41)
Albumin: 3.3 g/dL — ABNORMAL LOW (ref 3.5–5.0)
Alkaline Phosphatase: 57 U/L (ref 38–126)
Anion gap: 11 (ref 5–15)
BUN: 37 mg/dL — ABNORMAL HIGH (ref 8–23)
CO2: 22 mmol/L (ref 22–32)
Calcium: 8.4 mg/dL — ABNORMAL LOW (ref 8.9–10.3)
Chloride: 104 mmol/L (ref 98–111)
Creatinine, Ser: 2.06 mg/dL — ABNORMAL HIGH (ref 0.44–1.00)
GFR, Estimated: 23 mL/min — ABNORMAL LOW (ref >60)
Glucose, Bld: 97 mg/dL (ref 70–99)
Potassium: 4.2 mmol/L (ref 3.5–5.1)
Sodium: 137 mmol/L (ref 135–145)
Total Bilirubin: 0.2 mg/dL — ABNORMAL LOW (ref 0.3–1.2)
Total Protein: 5.6 g/dL — ABNORMAL LOW (ref 6.5–8.1)

## 2023-01-27 MED ORDER — TECHNETIUM TC 99M MEBROFENIN IV KIT
5.3000 | PACK | Freq: Once | INTRAVENOUS | Status: AC | PRN
  Administered 2023-01-27: 5.3 via INTRAVENOUS

## 2023-01-27 MED ORDER — SODIUM CHLORIDE 0.9 % IV SOLN: INTRAVENOUS | Status: DC

## 2023-01-27 MED ORDER — SODIUM CHLORIDE 0.9 % IV SOLN: INTRAVENOUS | Status: AC

## 2023-01-27 MED ORDER — CEFAZOLIN SODIUM-DEXTROSE 2-4 GM/100ML-% IV SOLN: 2.0000 g | INTRAVENOUS | Status: AC

## 2023-01-27 MED ORDER — CHLORHEXIDINE GLUCONATE 4 % EX SOLN
60.0000 mL | Freq: Once | CUTANEOUS | Status: AC
  Administered 2023-01-28: 4 via TOPICAL
  Filled 2023-01-27: qty 60

## 2023-01-27 MED ORDER — SODIUM CHLORIDE 0.9 % IV SOLN
80.0000 mg | INTRAVENOUS | Status: AC
  Filled 2023-01-27: qty 2

## 2023-01-27 MED ORDER — OXYCODONE HCL 5 MG PO TABS
5.0000 mg | ORAL_TABLET | Freq: Four times a day (QID) | ORAL | Status: DC | PRN
  Administered 2023-01-27 – 2023-02-03 (×14): 5 mg via ORAL
  Filled 2023-01-27 (×14): qty 1

## 2023-01-27 MED ORDER — CHLORHEXIDINE GLUCONATE 4 % EX SOLN
60.0000 mL | Freq: Once | CUTANEOUS | Status: AC
  Administered 2023-01-27: 4 via TOPICAL
  Filled 2023-01-27: qty 60

## 2023-01-27 NOTE — Progress Notes (Signed)
Subjective: Patient still with some abdominal pain, in the RUQ, but some centrally as well.  No nausea or vomiting.  Doesn't eat much at home as she "just doesn't think about it," not because of pain or N/V.    ROS: See above, otherwise other systems negative  Objective: Vital signs in last 24 hours: Temp:  [97.4 F (36.3 C)-97.7 F (36.5 C)] 97.7 F (36.5 C) (09/03 0734) Pulse Rate:  [57-90] 64 (09/03 0734) Resp:  [18-19] 18 (09/03 0423) BP: (94-138)/(35-84) 126/57 (09/03 0734) SpO2:  [94 %-100 %] 95 % (09/03 0734) Last BM Date : 01/26/23  Intake/Output from previous day: 09/02 0701 - 09/03 0700 In: 779.2 [I.V.:655; IV Piggyback:124.2] Out: 0  Intake/Output this shift: No intake/output data recorded.  PE: Abd: soft, mild central tenderness and mild RUQ tenderness, no guarding, +BS  Lab Results:  Recent Labs    01/26/23 0537 01/27/23 0618  WBC 8.6 6.1  HGB 11.5* 10.8*  HCT 36.1 33.7*  PLT 245 213   BMET Recent Labs    01/26/23 0542 01/27/23 0618  NA 139 137  K 4.2 4.2  CL 108 104  CO2 22 22  GLUCOSE 101* 97  BUN 36* 37*  CREATININE 2.14* 2.06*  CALCIUM 8.7* 8.4*   PT/INR Recent Labs    01/26/23 0537  LABPROT 13.9  INR 1.1   CMP     Component Value Date/Time   NA 137 01/27/2023 0618   K 4.2 01/27/2023 0618   CL 104 01/27/2023 0618   CO2 22 01/27/2023 0618   GLUCOSE 97 01/27/2023 0618   BUN 37 (H) 01/27/2023 0618   CREATININE 2.06 (H) 01/27/2023 0618   CREATININE 1.00 (H) 01/11/2015 0954   CALCIUM 8.4 (L) 01/27/2023 0618   PROT 5.6 (L) 01/27/2023 0618   ALBUMIN 3.3 (L) 01/27/2023 0618   AST 46 (H) 01/27/2023 0618   ALT 47 (H) 01/27/2023 0618   ALKPHOS 57 01/27/2023 0618   BILITOT 0.2 (L) 01/27/2023 0618   GFRNONAA 23 (L) 01/27/2023 0618   GFRAA 52 (L) 02/27/2020 1107   Lipase     Component Value Date/Time   LIPASE 92 (H) 01/25/2023 2340       Studies/Results: US Abdomen Limited RUQ (LIVER/GB)  Result Date:  01/26/2023 CLINICAL DATA:  Right upper quadrant pain EXAM: ULTRASOUND ABDOMEN LIMITED RIGHT UPPER QUADRANT COMPARISON:  CT earlier today FINDINGS: Gallbladder: 6 mm stone in the gallbladder neck region. 9 mm polyp noted. Mild gallbladder wall thickening at 4 mm. Common bile duct: Diameter: Upper limits normal in size for patient's age measuring up to 7-8 mm. Liver: No focal lesion identified. Within normal limits in parenchymal echogenicity. Portal vein is patent on color Doppler imaging with normal direction of blood flow towards the liver. Other: None. IMPRESSION: Small gallstone. 9 mm gallbladder wall polyp. Mild gallbladder wall thickening. Recommend clinical correlation for possible early cholecystitis. Electronically Signed   By: Charlett Nose M.D.   On: 01/26/2023 03:11   CT ABDOMEN PELVIS WO CONTRAST  Result Date: 01/26/2023 CLINICAL DATA:  Abdominal pain, acute, to the right flank EXAM: CT ABDOMEN AND PELVIS WITHOUT CONTRAST CT LUMBAR SPINE WITHOUT CONTRAST TECHNIQUE: Multidetector CT imaging of the abdomen and pelvis was performed following the standard protocol without IV contrast. Multidetector CT imaging of the lumbar spine was performed without intravenous contrast administration. Multiplanar CT image reconstructions were also generated. RADIATION DOSE REDUCTION: This exam was performed according to the departmental dose-optimization program which includes  automated exposure control, adjustment of the mA and/or kV according to patient size and/or use of iterative reconstruction technique. COMPARISON:  CT abdomen and pelvis 01/01/2023 FINDINGS: ABDOMEN/PELVIS: Lower chest: Cardiomegaly. Interlobular septal thickening in the lower lungs. Trace left effusion. Hepatobiliary: Small amount of pericholecystic fluid, slightly increased from prior. No radiopaque stone. Stable caliber biliary ducts. Scattered hepatic cysts. Pancreas: Unremarkable. Spleen: Unremarkable. Adrenals/Urinary Tract: Stable adrenal  glands. Unchanged left nephrolithiasis. No obstructing ureteral calculi. No hydronephrosis. Interpolar right kidney 1.8 cm hyperdense hemorrhagic or proteinaceous cyst. No specific follow-up imaging recommended. Unremarkable bladder. Stomach/Bowel: Normal caliber large and small bowel. No bowel wall thickening. Stomach is within normal limits. The appendix is not visualized. Vascular/Lymphatic: Aortic atherosclerosis. No enlarged abdominal or pelvic lymph nodes. Reproductive: Hysterectomy.  No adnexal mass. Other: No free intraperitoneal fluid or air. Musculoskeletal: No acute fracture. LUMBAR SPINE: Segmentation: 5 lumbar type vertebrae. Alignment: Unchanged alignment from 01/01/2023. No evidence of traumatic listhesis. Vertebrae: No acute fracture. Chronic compression fracture of T12 status post vertebroplasty. Paraspinal and other soft tissues: Reported above. Disc levels: Multilevel spondylosis. Advanced disc space height loss and degenerative endplate changes at L5-S1. Advanced facet arthropathy throughout the lumbar spine. Posterior disc extrusion at L5-S1 causes narrowing of the left lateral recess mild spinal canal narrowing, similar to MRI 08/05/2021. IMPRESSION: 1. Small amount of pericholecystic fluid, slightly increased from prior. No radiopaque stone. If there is clinical concern for acute cholecystitis, consider further evaluation with right upper quadrant ultrasound. 2. Unchanged left nephrolithiasis. No obstructing ureteral calculi. No hydronephrosis. 3. No acute fracture or traumatic listhesis in the lumbar spine. 4. Cardiomegaly with interlobular septal thickening in the lower lungs and trace left effusion, suggestive of pulmonary edema. Aortic Atherosclerosis (ICD10-I70.0). Electronically Signed   By: Minerva Fester M.D.   On: 01/26/2023 01:13   CT L-SPINE NO CHARGE  Result Date: 01/26/2023 CLINICAL DATA:  Abdominal pain, acute, to the right flank EXAM: CT ABDOMEN AND PELVIS WITHOUT CONTRAST  CT LUMBAR SPINE WITHOUT CONTRAST TECHNIQUE: Multidetector CT imaging of the abdomen and pelvis was performed following the standard protocol without IV contrast. Multidetector CT imaging of the lumbar spine was performed without intravenous contrast administration. Multiplanar CT image reconstructions were also generated. RADIATION DOSE REDUCTION: This exam was performed according to the departmental dose-optimization program which includes automated exposure control, adjustment of the mA and/or kV according to patient size and/or use of iterative reconstruction technique. COMPARISON:  CT abdomen and pelvis 01/01/2023 FINDINGS: ABDOMEN/PELVIS: Lower chest: Cardiomegaly. Interlobular septal thickening in the lower lungs. Trace left effusion. Hepatobiliary: Small amount of pericholecystic fluid, slightly increased from prior. No radiopaque stone. Stable caliber biliary ducts. Scattered hepatic cysts. Pancreas: Unremarkable. Spleen: Unremarkable. Adrenals/Urinary Tract: Stable adrenal glands. Unchanged left nephrolithiasis. No obstructing ureteral calculi. No hydronephrosis. Interpolar right kidney 1.8 cm hyperdense hemorrhagic or proteinaceous cyst. No specific follow-up imaging recommended. Unremarkable bladder. Stomach/Bowel: Normal caliber large and small bowel. No bowel wall thickening. Stomach is within normal limits. The appendix is not visualized. Vascular/Lymphatic: Aortic atherosclerosis. No enlarged abdominal or pelvic lymph nodes. Reproductive: Hysterectomy.  No adnexal mass. Other: No free intraperitoneal fluid or air. Musculoskeletal: No acute fracture. LUMBAR SPINE: Segmentation: 5 lumbar type vertebrae. Alignment: Unchanged alignment from 01/01/2023. No evidence of traumatic listhesis. Vertebrae: No acute fracture. Chronic compression fracture of T12 status post vertebroplasty. Paraspinal and other soft tissues: Reported above. Disc levels: Multilevel spondylosis. Advanced disc space height loss and  degenerative endplate changes at L5-S1. Advanced facet arthropathy throughout the lumbar spine. Posterior disc  extrusion at L5-S1 causes narrowing of the left lateral recess mild spinal canal narrowing, similar to MRI 08/05/2021. IMPRESSION: 1. Small amount of pericholecystic fluid, slightly increased from prior. No radiopaque stone. If there is clinical concern for acute cholecystitis, consider further evaluation with right upper quadrant ultrasound. 2. Unchanged left nephrolithiasis. No obstructing ureteral calculi. No hydronephrosis. 3. No acute fracture or traumatic listhesis in the lumbar spine. 4. Cardiomegaly with interlobular septal thickening in the lower lungs and trace left effusion, suggestive of pulmonary edema. Aortic Atherosclerosis (ICD10-I70.0). Electronically Signed   By: Minerva Fester M.D.   On: 01/26/2023 01:13   DG Chest Port 1 View  Result Date: 01/25/2023 CLINICAL DATA:  cp EXAM: PORTABLE CHEST 1 VIEW COMPARISON:  CT angio chest 01/01/2023, chest x-ray 824 FINDINGS: Cardiac paddles overlie the chest. The heart and mediastinal contours are unchanged. Aortic calcification. No focal consolidation. Chronic coarsened interstitial markings with no overt pulmonary edema. No pleural effusion. No pneumothorax. No acute osseous abnormality. IMPRESSION: No active disease. Electronically Signed   By: Tish Frederickson M.D.   On: 01/25/2023 23:36    Anti-infectives: Anti-infectives (From admission, onward)    Start     Dose/Rate Route Frequency Ordered Stop   01/26/23 0400  cefTRIAXone (ROCEPHIN) 2 g in sodium chloride 0.9 % 100 mL IVPB        2 g 200 mL/hr over 30 Minutes Intravenous Every 24 hours 01/26/23 0348 01/31/23 0359   01/26/23 0400  metroNIDAZOLE (FLAGYL) IVPB 500 mg        500 mg 100 mL/hr over 60 Minutes Intravenous Every 12 hours 01/26/23 0348          Assessment/Plan Abdominal pain, cholelithiasis - AF, normal WBC.  Mild RUQ tenderness.  She does have some gallstones,  but doesn't clinically seem like she has cholecystitis. - history of is a bit unclear and difficult to tell if she is having biliary colic -HIDA scan pending.  Further recommendations to follow this.  FEN - NPO for HIDA/IVFs VTE - Eliquis on hold ID - Rocephin, stopped flagyl - not indicated for cholecystitis unless severe infection thought to be had.  Anemia Bradycardia AKI on CKD H/o a fib Diastolic HF H/O CVA Memory impairment  I reviewed hospitalist notes, last 24 h vitals and pain scores, last 48 h intake and output, last 24 h labs and trends, and last 24 h imaging results.   LOS: 1 day    Letha Cape , [facility] 01/27/2023, 9:51 AM Please see Amion for pager number during day hours 7:00am-4:30pm or 7:00am -11:30am on weekends

## 2023-01-27 NOTE — Progress Notes (Signed)
HIDA negative. No role for cholecystectomy. Consider GI consult for further work up of abdominal pain. Message sent to primary team with these recommendations. We will sign off, please call with questions or concerns.   Franne Forts, PA-C [facility] 01/27/2023, 4:00 PM Please see Amion for pager number during day hours 7:00am-4:30pm

## 2023-01-27 NOTE — Plan of Care (Signed)
  Problem: Education: Goal: Knowledge of General Education information will improve Description: Including pain rating scale, medication(s)/side effects and non-pharmacologic comfort measures Outcome: Progressing   Problem: Activity: Goal: Risk for activity intolerance will decrease Outcome: Progressing   Problem: Elimination: Goal: Will not experience complications related to bowel motility Outcome: Progressing Goal: Will not experience complications related to urinary retention Outcome: Progressing   

## 2023-01-27 NOTE — Progress Notes (Signed)
Echocardiogram 2D Echocardiogram has been performed.  [name] 01/27/2023, 12:40 PM

## 2023-01-27 NOTE — Plan of Care (Signed)
  Problem: Education: Goal: Knowledge of General Education information will improve Description Including pain rating scale, medication(s)/side effects and non-pharmacologic comfort measures Outcome: Progressing   Problem: Education: Goal: Knowledge of cardiac device and self-care will improve Outcome: Progressing

## 2023-01-27 NOTE — Progress Notes (Addendum)
PROGRESS NOTE    [name]  ZOX:[id number] DOB: [birth year] DOA: 01/25/2023 PCP: Benita Stabile, MD   Brief Narrative:  [name] is a 87 y.o. female with medical history CKD stage IV, significant of essential hypertension, paroxysmal atrial fibrillation on Eliquis 2.5 mg twice daily, hypothyroidism, hyperlipidemia, history of CVA/TIA, and chronic anemia presented to emergency department for evaluation for abdominal pain.  She was diagnosed with acute cholecystitis.  Initially hemoglobin was down to 8.1 all the way from 13 of baseline but FOBT was negative and repeat hemoglobin is 11 so 8.1 was likely an error.  Patient was admitted under hospitalist service, started on antibiotics, general surgery consulted.     Assessment & Plan:   Principal Problem:   Acute cholecystitis Active Problems:   Acute anemia   Hypothyroidism   History of CVA (cerebrovascular accident)   Hyperlipidemia   Memory impairment   CKD (chronic kidney disease), stage IV (HCC)   Acute kidney injury superimposed on CKD (HCC)   Atrial fibrillation, chronic (HCC)   Chronic back pain due to kyphosis  Acute cholecystitis/cholelithiasis:  RUQ which showed gallstone 9 mm gallbladder wall polyp, mild gallbladder wall thickening possible early cholecystitis.   Patient has been started on Rocephin and Flagyl.  Abdominal pain and tenderness improving.  HIDA scan pending.  Further recommendations by surgery after HIDA scan results.  Addendum/update: HIDA scan is negative for acute cholecystitis and general surgery has signed off.  No role of antibiotics, will discontinue them.  Her pain is likely due to cholelithiasis.  I do not believe GI consultation is needed as inpatient.  Her pain is improving anyways.  I will consult PT OT, they will likely see her in the morning.  Patient may be ready for discharge tomorrow.  Asymptomatic sinus bradycardia with history of paroxysmal atrial fibrillation: TSH within normal  limit.  Echo pending.  Patient was on flecainide 50 mg twice daily at home which has been stopped.  Waiting for cardiology evaluation.  Eliquis on hold until further clarification of plans for general surgery.  Mild normocytic anemia: Patient's baseline hemoglobin appears to be around 13-14, current hemoglobin 7.5.  FOBT negative.  Iron studies and B12 normal as well.  Likely due to chronic disease.  No indication of transfusion.  Monitor.  AKI on CKD stage IIIb: Baseline creatinine around 1.3-1.9, she presented with 2.53 which is improved to 2.06.  Will resume normal saline for another 12 hours.  Chronic grade 3 diastolic heart failure: Appears to be euvolemic.  Holding Lasix and losartan in the setting of AKI.  Hypothyroidism, acquired: Continue Synthroid.  Hyperlipidemia: Continue ezetimibe.  Memory impairment: Continue memantine.  She is currently fully alert and oriented.  Chronic low back pain and right hip pain: Patient states that she lives with the pain.  Current pain is at her baseline.  Essential hypertension: Stable.  Continue home dose of amlodipine but holding losartan.  DVT prophylaxis: SCDs Start: 01/26/23 0349   Code Status: Full Code  Family Communication:  None present at bedside.  Plan of care discussed with patient in length and he/she verbalized understanding and agreed with it.  Status is: Inpatient Remains inpatient appropriate because: Needs IV antibiotics and HIDA scan.  Needs IV fluids as well.   Estimated body mass index is 28.01 kg/m as calculated from the following:   Height as of this encounter: 4\' 11"  (1.499 m).   Weight as of this encounter: 62.9 kg.    Nutritional Assessment:  Body mass index is 28.01 kg/m.Marland Kitchen Seen by dietician.  I agree with the assessment and plan as outlined below: Nutrition Status:        . Skin Assessment: I have examined the patient's skin and I agree with the wound assessment as performed by the wound care RN as  outlined below:    Consultants:  General surgery, cardiology.  Procedures:  None  Antimicrobials:  Anti-infectives (From admission, onward)    Start     Dose/Rate Route Frequency Ordered Stop   01/26/23 0400  cefTRIAXone (ROCEPHIN) 2 g in sodium chloride 0.9 % 100 mL IVPB        2 g 200 mL/hr over 30 Minutes Intravenous Every 24 hours 01/26/23 0348 01/31/23 0359   01/26/23 0400  metroNIDAZOLE (FLAGYL) IVPB 500 mg  Status:  Discontinued        500 mg 100 mL/hr over 60 Minutes Intravenous Every 12 hours 01/26/23 0348 01/27/23 0957         Subjective: Patient seen and examined.  No new complaints.  She wants to eat but she was counseled that she has to be n.p.o. until HIDA scan done and further clarification of plans by general surgery.  She says that her abdominal pain is improving.  Objective: Vitals:   01/26/23 1650 01/26/23 2059 01/27/23 0423 01/27/23 0734  BP: (!) 99/35 94/82 138/84 (!) 126/57  Pulse: 90 67 (!) 57 64  Resp: 19 18 18    Temp: (!) 97.4 F (36.3 C)  97.7 F (36.5 C) 97.7 F (36.5 C)  TempSrc:    Oral  SpO2: 98% 100% 94% 95%  Weight:      Height:        Intake/Output Summary (Last 24 hours) at 01/27/2023 1030 Last data filed at 01/27/2023 0511 Gross per 24 hour  Intake 776.22 ml  Output 0 ml  Net 776.22 ml   Filed Weights   01/26/23 0522  Weight: 62.9 kg    Examination:  General exam: Appears calm and comfortable  Respiratory system: Clear to auscultation. Respiratory effort normal. Cardiovascular system: S1 & S2 heard, RRR. No JVD, murmurs, rubs, gallops or clicks. No pedal edema. Gastrointestinal system: Abdomen is nondistended, soft and right upper quadrant tenderness. No organomegaly or masses felt. Normal bowel sounds heard. Central nervous system: Alert and oriented. No focal neurological deficits. Extremities: Symmetric 5 x 5 power. Skin: No rashes, lesions or ulcers.    Data Reviewed: I have personally reviewed following labs and  imaging studies  CBC: Recent Labs  Lab 01/25/23 2340 01/26/23 0537 01/27/23 0618  WBC 4.6 8.6 6.1  HGB 8.1* 11.5* 10.8*  HCT 26.2* 36.1 33.7*  MCV 96.0 90.3 93.4  PLT 145* 245 213   Basic Metabolic Panel: Recent Labs  Lab 01/25/23 2340 01/26/23 0542 01/27/23 0618  NA 138 139 137  K 4.8 4.2 4.2  CL 104 108 104  CO2 22 22 22   GLUCOSE 104* 101* 97  BUN 41* 36* 37*  CREATININE 2.53* 2.14* 2.06*  CALCIUM 8.9 8.7* 8.4*  MG 2.6*  --   --    GFR: Estimated Creatinine Clearance: 16.1 mL/min (A) (by C-G formula based on SCr of 2.06 mg/dL (H)). Liver Function Tests: Recent Labs  Lab 01/25/23 2340 01/26/23 0120 01/26/23 0542 01/27/23 0618  AST 57* 44* 44* 46*  ALT 38 38 40 47*  ALKPHOS 58 56 55 57  BILITOT 0.6 0.7 0.5 0.2*  PROT 5.8* 5.9* 5.8* 5.6*  ALBUMIN 3.5 3.4* 3.4* 3.3*  Recent Labs  Lab 01/25/23 2340  LIPASE 92*   No results for input(s): "AMMONIA" in the last 168 hours. Coagulation Profile: Recent Labs  Lab 01/26/23 0537  INR 1.1   Cardiac Enzymes: No results for input(s): "CKTOTAL", "CKMB", "CKMBINDEX", "TROPONINI" in the last 168 hours. BNP (last 3 results) No results for input(s): "PROBNP" in the last 8760 hours. HbA1C: No results for input(s): "HGBA1C" in the last 72 hours. CBG: No results for input(s): "GLUCAP" in the last 168 hours. Lipid Profile: No results for input(s): "CHOL", "HDL", "LDLCALC", "TRIG", "CHOLHDL", "LDLDIRECT" in the last 72 hours. Thyroid Function Tests: Recent Labs    01/26/23 0120  TSH 1.610   Anemia Panel: Recent Labs    01/26/23 0537 01/26/23 0542  VITAMINB12 1,154*  --   FOLATE  --  29.8  FERRITIN 226  --   TIBC 300  --   IRON 38  --   RETICCTPCT 0.8  --    Sepsis Labs: No results for input(s): "PROCALCITON", "LATICACIDVEN" in the last 168 hours.  Recent Results (from the past 240 hour(s))  Culture, blood (Routine X 2) w Reflex to ID Panel     Status: None (Preliminary result)   Collection Time:  01/26/23  5:35 AM   Specimen: BLOOD  Result Value Ref Range Status   Specimen Description BLOOD BLOOD RIGHT HAND  Final   Special Requests   Final    BOTTLES DRAWN AEROBIC AND ANAEROBIC Blood Culture adequate volume   Culture   Final    NO GROWTH 1 DAY Performed at [facility], [address] [place], [place] [address]    Report Status PENDING  Incomplete  Culture, blood (Routine X 2) w Reflex to ID Panel     Status: None (Preliminary result)   Collection Time: 01/26/23  5:37 AM   Specimen: BLOOD  Result Value Ref Range Status   Specimen Description BLOOD BLOOD LEFT HAND  Final   Special Requests   Final    BOTTLES DRAWN AEROBIC AND ANAEROBIC Blood Culture adequate volume   Culture   Final    NO GROWTH 1 DAY Performed at [facility], [address], [place], [place] [address]    Report Status PENDING  Incomplete     Radiology Studies: US Abdomen Limited RUQ (LIVER/GB)  Result Date: 01/26/2023 CLINICAL DATA:  Right upper quadrant pain EXAM: ULTRASOUND ABDOMEN LIMITED RIGHT UPPER QUADRANT COMPARISON:  CT earlier today FINDINGS: Gallbladder: 6 mm stone in the gallbladder neck region. 9 mm polyp noted. Mild gallbladder wall thickening at 4 mm. Common bile duct: Diameter: Upper limits normal in size for patient's age measuring up to 7-8 mm. Liver: No focal lesion identified. Within normal limits in parenchymal echogenicity. Portal vein is patent on color Doppler imaging with normal direction of blood flow towards the liver. Other: None. IMPRESSION: Small gallstone. 9 mm gallbladder wall polyp. Mild gallbladder wall thickening. Recommend clinical correlation for possible early cholecystitis. Electronically Signed   By: Charlett Nose M.D.   On: 01/26/2023 03:11   CT ABDOMEN PELVIS WO CONTRAST  Result Date: 01/26/2023 CLINICAL DATA:  Abdominal pain, acute, to the right flank EXAM: CT ABDOMEN AND PELVIS WITHOUT CONTRAST CT LUMBAR SPINE WITHOUT CONTRAST TECHNIQUE: Multidetector CT  imaging of the abdomen and pelvis was performed following the standard protocol without IV contrast. Multidetector CT imaging of the lumbar spine was performed without intravenous contrast administration. Multiplanar CT image reconstructions were also generated. RADIATION DOSE REDUCTION: This exam  was performed according to the departmental dose-optimization program which includes automated exposure control, adjustment of the mA and/or kV according to patient size and/or use of iterative reconstruction technique. COMPARISON:  CT abdomen and pelvis 01/01/2023 FINDINGS: ABDOMEN/PELVIS: Lower chest: Cardiomegaly. Interlobular septal thickening in the lower lungs. Trace left effusion. Hepatobiliary: Small amount of pericholecystic fluid, slightly increased from prior. No radiopaque stone. Stable caliber biliary ducts. Scattered hepatic cysts. Pancreas: Unremarkable. Spleen: Unremarkable. Adrenals/Urinary Tract: Stable adrenal glands. Unchanged left nephrolithiasis. No obstructing ureteral calculi. No hydronephrosis. Interpolar right kidney 1.8 cm hyperdense hemorrhagic or proteinaceous cyst. No specific follow-up imaging recommended. Unremarkable bladder. Stomach/Bowel: Normal caliber large and small bowel. No bowel wall thickening. Stomach is within normal limits. The appendix is not visualized. Vascular/Lymphatic: Aortic atherosclerosis. No enlarged abdominal or pelvic lymph nodes. Reproductive: Hysterectomy.  No adnexal mass. Other: No free intraperitoneal fluid or air. Musculoskeletal: No acute fracture. LUMBAR SPINE: Segmentation: 5 lumbar type vertebrae. Alignment: Unchanged alignment from 01/01/2023. No evidence of traumatic listhesis. Vertebrae: No acute fracture. Chronic compression fracture of T12 status post vertebroplasty. Paraspinal and other soft tissues: Reported above. Disc levels: Multilevel spondylosis. Advanced disc space height loss and degenerative endplate changes at L5-S1. Advanced facet  arthropathy throughout the lumbar spine. Posterior disc extrusion at L5-S1 causes narrowing of the left lateral recess mild spinal canal narrowing, similar to MRI 08/05/2021. IMPRESSION: 1. Small amount of pericholecystic fluid, slightly increased from prior. No radiopaque stone. If there is clinical concern for acute cholecystitis, consider further evaluation with right upper quadrant ultrasound. 2. Unchanged left nephrolithiasis. No obstructing ureteral calculi. No hydronephrosis. 3. No acute fracture or traumatic listhesis in the lumbar spine. 4. Cardiomegaly with interlobular septal thickening in the lower lungs and trace left effusion, suggestive of pulmonary edema. Aortic Atherosclerosis (ICD10-I70.0). Electronically Signed   By: Minerva Fester M.D.   On: 01/26/2023 01:13   CT L-SPINE NO CHARGE  Result Date: 01/26/2023 CLINICAL DATA:  Abdominal pain, acute, to the right flank EXAM: CT ABDOMEN AND PELVIS WITHOUT CONTRAST CT LUMBAR SPINE WITHOUT CONTRAST TECHNIQUE: Multidetector CT imaging of the abdomen and pelvis was performed following the standard protocol without IV contrast. Multidetector CT imaging of the lumbar spine was performed without intravenous contrast administration. Multiplanar CT image reconstructions were also generated. RADIATION DOSE REDUCTION: This exam was performed according to the departmental dose-optimization program which includes automated exposure control, adjustment of the mA and/or kV according to patient size and/or use of iterative reconstruction technique. COMPARISON:  CT abdomen and pelvis 01/01/2023 FINDINGS: ABDOMEN/PELVIS: Lower chest: Cardiomegaly. Interlobular septal thickening in the lower lungs. Trace left effusion. Hepatobiliary: Small amount of pericholecystic fluid, slightly increased from prior. No radiopaque stone. Stable caliber biliary ducts. Scattered hepatic cysts. Pancreas: Unremarkable. Spleen: Unremarkable. Adrenals/Urinary Tract: Stable adrenal glands.  Unchanged left nephrolithiasis. No obstructing ureteral calculi. No hydronephrosis. Interpolar right kidney 1.8 cm hyperdense hemorrhagic or proteinaceous cyst. No specific follow-up imaging recommended. Unremarkable bladder. Stomach/Bowel: Normal caliber large and small bowel. No bowel wall thickening. Stomach is within normal limits. The appendix is not visualized. Vascular/Lymphatic: Aortic atherosclerosis. No enlarged abdominal or pelvic lymph nodes. Reproductive: Hysterectomy.  No adnexal mass. Other: No free intraperitoneal fluid or air. Musculoskeletal: No acute fracture. LUMBAR SPINE: Segmentation: 5 lumbar type vertebrae. Alignment: Unchanged alignment from 01/01/2023. No evidence of traumatic listhesis. Vertebrae: No acute fracture. Chronic compression fracture of T12 status post vertebroplasty. Paraspinal and other soft tissues: Reported above. Disc levels: Multilevel spondylosis. Advanced disc space height loss and degenerative endplate changes at L5-S1.  Advanced facet arthropathy throughout the lumbar spine. Posterior disc extrusion at L5-S1 causes narrowing of the left lateral recess mild spinal canal narrowing, similar to MRI 08/05/2021. IMPRESSION: 1. Small amount of pericholecystic fluid, slightly increased from prior. No radiopaque stone. If there is clinical concern for acute cholecystitis, consider further evaluation with right upper quadrant ultrasound. 2. Unchanged left nephrolithiasis. No obstructing ureteral calculi. No hydronephrosis. 3. No acute fracture or traumatic listhesis in the lumbar spine. 4. Cardiomegaly with interlobular septal thickening in the lower lungs and trace left effusion, suggestive of pulmonary edema. Aortic Atherosclerosis (ICD10-I70.0). Electronically Signed   By: Minerva Fester M.D.   On: 01/26/2023 01:13   DG Chest Port 1 View  Result Date: 01/25/2023 CLINICAL DATA:  cp EXAM: PORTABLE CHEST 1 VIEW COMPARISON:  CT angio chest 01/01/2023, chest x-ray 824 FINDINGS:  Cardiac paddles overlie the chest. The heart and mediastinal contours are unchanged. Aortic calcification. No focal consolidation. Chronic coarsened interstitial markings with no overt pulmonary edema. No pleural effusion. No pneumothorax. No acute osseous abnormality. IMPRESSION: No active disease. Electronically Signed   By: Tish Frederickson M.D.   On: 01/25/2023 23:36    Scheduled Meds:  amLODipine  10 mg Oral Daily   ezetimibe  10 mg Oral Daily   ferrous gluconate  324 mg Oral Daily   levothyroxine  75 mcg Oral QAC breakfast   memantine  10 mg Oral BID   pantoprazole  40 mg Oral Daily   sodium chloride flush  3 mL Intravenous Q12H   Continuous Infusions:  sodium chloride     sodium chloride 100 mL/hr at 01/27/23 0826   cefTRIAXone (ROCEPHIN)  IV 2 g (01/27/23 0432)     LOS: 1 day   Hughie Closs, MD [facility]  01/27/2023, 10:30 AM   *Please note that this is a verbal dictation therefore any spelling or grammatical errors are due to the "[employer]agon [employer] One" system interpretation.  Please page via Amion and do not message via secure chat for urgent patient care matters. Secure chat can be used for non urgent patient care matters.  How to contact the [facility] Attending or Consulting provider 7A - 7P or covering provider during after hours 7P -7A, for this patient?  Check the care team in [facility] and look for a) attending/consulting [facility] provider listed and b) the [facility] team listed. Page or secure chat 7A-7P. Log into [email or web address] and use [facility] universal password to access. If you do not have the password, please contact the hospital operator. Locate the [facility] provider you are looking for under [facility] and page to a number that you can be directly reached. If you still have difficulty reaching the provider, please page the [facility] (Director on Call) for the Hospitalists listed on amion for assistance.

## 2023-01-27 NOTE — Consult Note (Signed)
Cardiology Consultation   Patient ID: [name] MRN: [id number]; DOB: [birth year]  Admit date: 01/25/2023 Date of Consult: 01/27/2023  PCP:  Cynthia Stabile, MD   [facility] Providers Cardiologist:  Cynthia Rich, MD  Electrophysiologist:  Cynthia Bunting, MD  {   Patient Profile:   [name] is a 87 y.o. female with a hx of AFib, HFpEF, HTN, prior stroke, hypothyroidism, CKD (IV),  who is being seen 01/27/2023 for the evaluation of bradycardia at the request of Dr. Jacqulyn Bath.  History of Present Illness:   [name] last saw [name] 03/11/22, discussed her hx of very symptomatic AFib w/RVR, managed with BB and flecainide.  At this visit she reported slow heart rates and planned for monitoring NSR with sinus bradycardia, down to 32/min Occaisional PAC's No atrial fib, VT or SVT No symptoms to correlate sinus brady with atrial fib. Seems discussed if no symptoms planned for watchful waiting Maintained on flecainide and metoprolol  Admitted last month with abdominal complaints and dizziness, noted with intermittent bradycardia to 30's. Cardiology saw her maintained flecainide, stopped metoprolol (noted as well that she also takes 17 supplements in addition to her prescribed medications) Pt reported dizziness after an unknown medication via her PMD Rates reportedly were improved and planned for out pt follow up with cardiology  Admitted THIS admission yesterday with abdominal pain perhaps some SOB, found with acute cholecystitis, afebrile. Cardiology consulted for bradycardia who deferred to EP Home eliquis held for surgical eval Home flecainide also held  Newly anemic with neg FOB  Surgery rec HIDA scan to better clarify symptoms and findings with unclear clinical picture for a few months of symptoms  LABS K+ 4.8 >> 4.2 Mag 2.6 BUN/Creat 41/2.53 > 2.14 > 2.06 (baseline 1.3 - 1.7) AST 57, 44, 44, 46 A:T 38, 38, 40, 47 Lipase 92 (chronically  elevated) HS Trop 14, 8 WBC 5.6 Hgb 8.1 > 11.5 > 10.8 Hct 33 Plts 213  01/26/23 TSH 1.610  Echo LVEF 60-65% RV OK, RVSP 43.101mmHg No significant VHD   She tells me that she came with lighthedadness, "fainty", not sure about belly trouble. Right now her asthma is acting up. She does say that she has fainted in the past, but unclear when or what the situation was. She reports on/off lightheadedness for "a long time", and reports honestly she can't remember if she has fainted or not. No CP She is having quite a bit of her chronic back pain reported several surgeries on her back, "all cemented up" RUQ pain remains, though also says this pain radiates down her leg.     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54   age 79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 2 weeks   Hemorrhoids 2007   History of kidney stones    HTN (hypertension)    Hyperplastic colon polyp 2005   Hypothyroidism    Narcolepsy    PONV (postoperative nausea and vomiting)    TIA (transient  ischemic attack)     Past Surgical History:  Procedure Laterality Date   ABDOMINAL HYSTERECTOMY     age 28, complicated by poor wound healing, followed by revision of scar and radiation for ?malignancy   BACK SURGERY  07/2018   lost 2 inches   BREAST ENHANCEMENT SURGERY     BREAST IMPLANT EXCHANGE Right 06/23/2016   Procedure: RIGHT BREAST IMPLANT REMOVAL AND REPLACEMENT;  Surgeon: Louisa Second, MD;  Location: [facility];  Service: Plastics;  Laterality: Right;   COLONOSCOPY  2005   2 hyperplastic polyps   COLONOSCOPY  2007   Dr. Darrick Penna-  hemorrhoids   COLONOSCOPY  2008   Dr. Steva Ready polyp- rare sigmoid diverticulosis, internal hemorrhoids   COLONOSCOPY  12/01/2011   Procedure: COLONOSCOPY;  Surgeon: West Bali, MD;  Location: [facility];  Service: Endoscopy;  Laterality: N/A;  12:30 PM   COLONOSCOPY WITH PROPOFOL N/A 05/22/2022   Procedure: COLONOSCOPY WITH PROPOFOL;  Surgeon: Lanelle Bal, DO;  Location: [facility];  Service: Endoscopy;  Laterality: N/A;  1:00 PM   CYSTOSCOPY/URETEROSCOPY/HOLMIUM LASER/STENT PLACEMENT Left 02/29/2020   Procedure: CYSTOSCOPY/URETEROSCOPY/HOLMIUM LASER/STENT PLACEMENT;  Surgeon: Rene Paci, MD;  Location: [facility];  Service: Urology;  Laterality: Left;   epidural steroid injection     She is getting injections with Dr. Ethelene Hal q3wks.   ESOPHAGOGASTRODUODENOSCOPY (EGD) WITH ESOPHAGEAL DILATION N/A 04/15/2013   Procedure: ESOPHAGOGASTRODUODENOSCOPY (EGD) WITH ESOPHAGEAL DILATION;  Surgeon: West Bali, MD;  Location: [facility];  Service: Endoscopy;  Laterality: N/A;  11:45-moved to 12:30 [name] to notify pt   EYE SURGERY  2010   EYE SURGERY     IR KYPHO LUMBAR INC FX REDUCE BONE BX UNI/BIL CANNULATION INC/IMAGING  08/23/2018   IR RADIOLOGIST EVAL & MGMT  12/23/2018   POLYPECTOMY  05/22/2022   Procedure: POLYPECTOMY;  Surgeon: Lanelle Bal, DO;  Location: [facility];  Service: Endoscopy;;   right thumb surgery       Home Medications:  Prior to Admission medications   Medication Sig Start Date End Date Taking? Authorizing [name]  albuterol (VENTOLIN HFA) 108 (90 Base) MCG/ACT inhaler Inhale 2 puffs into the lungs every 6 (six) hours as needed for wheezing or shortness of breath. 01/02/23  Yes [name], Courage, MD  amLODipine (NORVASC) 10 MG tablet Take 1 tablet (10 mg total) by mouth daily. For BP 01/02/23 01/02/24 Yes [name], Courage, MD  apixaban (ELIQUIS) 2.5 MG TABS tablet Take 1 tablet (2.5 mg total) by mouth 2 (two) times daily. 01/02/23  Yes  [name], Courage, MD  Ascorbic Acid (VITAMIN C) 1000 MG tablet Take 1,000 mg by mouth daily.   Yes [name], Historical, MD  B Complex Vitamins (VITAMIN B COMPLEX) TABS Take 1 tablet by mouth daily.   Yes [name], Historical, MD  Calcium Carb-Cholecalciferol (CALCIUM 600 + D PO) Take 1 tablet by mouth in the morning and at bedtime.   Yes [name], Historical, MD  ezetimibe (ZETIA) 10 MG tablet Take 1 tablet (10 mg total) by mouth daily. 09/08/18  Yes Sharee Holster, NP  Ferrous Gluconate (IRON) 240 (27 Fe) MG TABS Take 1 tablet by mouth daily. 09/08/18  Yes Sharee Holster, NP  flecainide (TAMBOCOR) 50 MG tablet TAKE 1 TABLET(50 MG) BY MOUTH TWICE DAILY 02/11/22  Yes Marinus Maw, MD  furosemide (LASIX) 40 MG tablet Take 1 tablet (40 mg total) by mouth daily. 01/03/23  Yes Shon Hale, MD  levothyroxine (SYNTHROID) 75  MCG tablet Take 75 mcg by mouth daily before breakfast.   Yes [name], Historical, MD  losartan (COZAAR) 25 MG tablet Take 25 mg by mouth daily.   Yes [name], Historical, MD  Magnesium 250 MG TABS Take 250 mg by mouth daily.    Yes [name], Historical, MD  memantine (NAMENDA) 10 MG tablet Take 1 tablet (10 mg total) by mouth 2 (two) times daily. 12/09/22  Yes Glean Salvo, NP  Omega-3 Fatty Acids (FISH OIL) 1200 MG CAPS Take 1,200 mg by mouth daily.   Yes [name], Historical, MD  pantoprazole (PROTONIX) 40 MG tablet Take 1 tablet (40 mg total) by mouth daily. 01/02/23  Yes [name], Courage, MD  Potassium Chloride ER 20 MEQ TBCR Take 1 tablet (20 mEq total) by mouth daily. 1 tab daily by mouth--- take while taking Lasix/furosemide 01/02/23  Yes [name], Courage, MD  Vitamin D, Cholecalciferol, 25 MCG (1000 UT) TABS Take 1,000 Int'l Units/1.47m2 by mouth daily at 6 (six) AM.   Yes [name], Historical, MD  vitamin E 180 MG (400 UNITS) capsule Take 400 Units by mouth daily.   Yes [name], Historical, MD  lidocaine (LIDODERM) 5 % Place 1 patch onto the skin daily. Remove &  Discard patch within 12 hours or as directed by MD Patient not taking: Reported on 01/26/2023 01/14/23   Burgess Amor, PA-C  senna-docusate (SENOKOT-S) 8.6-50 MG tablet Take 2 tablets by mouth at bedtime. Patient not taking: Reported on 01/26/2023 01/02/23   Shon Hale, MD    Inpatient Medications: Scheduled Meds:  amLODipine  10 mg Oral Daily   ezetimibe  10 mg Oral Daily   ferrous gluconate  324 mg Oral Daily   levothyroxine  75 mcg Oral QAC breakfast   memantine  10 mg Oral BID   pantoprazole  40 mg Oral Daily   sodium chloride flush  3 mL Intravenous Q12H   Continuous Infusions:  sodium chloride     sodium chloride Stopped (01/27/23 1305)   cefTRIAXone (ROCEPHIN)  IV Stopped (01/27/23 0502)   PRN Meds: sodium chloride, acetaminophen, albuterol, HYDROmorphone (DILAUDID) injection, methocarbamol, ondansetron **OR** ondansetron (ZOFRAN) IV, senna-docusate, sodium chloride flush  Allergies:    Allergies  Allergen Reactions   Epinephrine Anaphylaxis   Demerol [Meperidine] Other (See Comments)    headaches   Morphine Sulfate Anxiety    Patient states it messes with her mind makes her narcoleptic   Vicodin [Hydrocodone-Acetaminophen] Other (See Comments)    York Spaniel it makes her crazy. Pt tolerates acetaminophen    Social History:   Social History   Socioeconomic History   Marital status: Widowed    Spouse name: Not on file   Number of children: 1   Years of education: 28   Highest education level: 12th grade  Occupational History   Not on file  Tobacco Use   Smoking status: Never    Passive exposure: Never   Smokeless tobacco: Never  Vaping Use   Vaping status: Never Used  Substance and Sexual Activity   Alcohol use: No   Drug use: No   Sexual activity: Not Currently    Partners: Male  Other Topics Concern   Not on file  Social History Narrative   Lives alone   Right Handed    Drinks no caffeine daily   Social Determinants of Health   Financial Resource  Strain: Low Risk  (02/06/2022)   Overall Financial Resource Strain (CARDIA)    Difficulty of Paying Living Expenses: Not hard at all  Food Insecurity: No Food Insecurity (01/26/2023)   Hunger Vital Sign    Worried About Running Out of Food in the Last Year: Never true    Ran Out of Food in the Last Year: Never true  Transportation Needs: No Transportation Needs (01/26/2023)   PRAPARE - Administrator, Civil Service (Medical): No    Lack of Transportation (Non-Medical): No  Physical Activity: Inactive (02/06/2022)   Exercise Vital Sign    Days of Exercise per Week: 0 days    Minutes of Exercise per Session: 0 min  Stress: No Stress Concern Present (02/06/2022)   [employer] of Occupational Health - Occupational Stress Questionnaire    Feeling of Stress : Only a little  Social Connections: Moderately Integrated (02/06/2022)   Social Connection and Isolation Panel [NHANES]    Frequency of Communication with Friends and Family: More than three times a week    Frequency of Social Gatherings with Friends and Family: More than three times a week    Attends Religious Services: More than 4 times per year    Active Member of [employer] or Organizations: Yes    Attends Banker Meetings: More than 4 times per year    Marital Status: Widowed  Intimate Partner Violence: Not At Risk (01/26/2023)   Humiliation, Afraid, Rape, and Kick questionnaire    Fear of Current or Ex-Partner: No    Emotionally Abused: No    Physically Abused: No    Sexually Abused: No    Family History:   Family History  Problem Relation Age of Onset   Colon cancer Father        age 46   Prostate cancer Father    Pancreatic cancer Mother        age 55     ROS:  Please see the history of present illness.  All other ROS reviewed and negative.     Physical Exam/Data:   Vitals:   01/26/23 1650 01/26/23 2059 01/27/23 0423 01/27/23 0734  BP: (!) 99/35 94/82 138/84 (!) 126/57  Pulse: 90 67 (!) 57 64   Resp: 19 18 18    Temp: (!) 97.4 F (36.3 C)  97.7 F (36.5 C) 97.7 F (36.5 C)  TempSrc:    Oral  SpO2: 98% 100% 94% 95%  Weight:      Height:        Intake/Output Summary (Last 24 hours) at 01/27/2023 1525 Last data filed at 01/27/2023 1306 Gross per 24 hour  Intake 100 ml  Output 0 ml  Net 100 ml      01/26/2023    5:22 AM 01/14/2023   11:52 AM 12/18/2022    9:11 AM  Last 3 Weights  Weight (lbs) 138 lb 10.7 oz 132 lb 4.4 oz 132 lb 12.8 oz  Weight (kg) 62.9 kg 60 kg 60.238 kg     Body mass index is 28.01 kg/m.  General:  Well nourished, well developed, in no acute distress HEENT: normal Neck: no JVD Vascular: No carotid bruits; Distal pulses 2+ bilaterally Cardiac:  RRR; no murmurs, gallops or rubs Lungs:  CTA b/l, no wheezing, rhonchi or rales  Abd: soft, nontender, no hepatomegaly  Ext: no edema Musculoskeletal:  No deformities Skin: warm and dry  Neuro:  no focal abnormalities noted Psych:  Normal affect   EKG:  The EKG was personally reviewed and demonstrates:    #1 baseline artifact, looks junctional with about 30, vs slow AFib #2 SB 51, 1st degree  AVblock , QRS , LAD #3 SB 58, 1st degree AVblock , LAD #4 SB 58, PR , LAD  OLD 01/14/23: SR 66bpm, 1st degree AVBlock , LBBB, 187 (off BB) 01/02/23 : SR 70bpm, PR 03/12/23 SB 49bpm, PR 254  Telemetry:  Telemetry was personally reviewed and demonstrates:   SB/SR 50's-60's Junctional rhythm/bradycardia to 30's, more noted late evening/nocturnal with some pausing very early AM hours around 0300 3.8 seconds longest I found.     Relevant CV Studies:  01/27/23: TTE 1. Left ventricular ejection fraction, by estimation, is 60 to 65%. The  left ventricle has normal function. The left ventricle has no regional  wall motion abnormalities. Left ventricular diastolic parameters are  consistent with Grade II diastolic  dysfunction (pseudonormalization).   2. Right ventricular systolic  function is normal. The right ventricular  size is normal. There is mildly elevated pulmonary artery systolic  pressure. The estimated right ventricular systolic pressure is 43.1 mmHg.   3. Left atrial size was moderately dilated.   4. The mitral valve is normal in structure. Mild mitral valve  regurgitation. No evidence of mitral stenosis.   5. The aortic valve is calcified. There is mild calcification of the  aortic valve. Aortic valve regurgitation is trivial. Aortic valve  sclerosis/calcification is present, without any evidence of aortic  stenosis.   6. The inferior vena cava is dilated in size with <50% respiratory  variability, suggesting right atrial pressure of 15 mmHg.   Comparison(s): No significant change from prior study.   NST: 10/2020 Lexiscan stress is electrically negative for ischemia Myoview scan shows normal perfusion No ischemia or scar LVEF calculated at 88% Low risk study   Echocardiogram: 02/2022 IMPRESSIONS   1. Left ventricular ejection fraction, by estimation, is 60 to 65%. The  left ventricle has normal function. The left ventricle has no regional  wall motion abnormalities. There is mild left ventricular hypertrophy.  Left ventricular diastolic parameters  are consistent with Grade III diastolic dysfunction (restrictive).   2. Right ventricular systolic function is normal. The right ventricular  size is normal.   3. Left atrial size was mildly dilated.   4. Mitral Valve Area (MVA) = 1.76 cm2. The mitral valve is degenerative.  Mild mitral valve regurgitation. Mild mitral stenosis. Moderate mitral  annular calcification.   5. The aortic valve was not assessed. Aortic valve regurgitation is not  visualized.   Comparison(s): Similar to prior; heart rates low in both studies.   Laboratory Data:  High Sensitivity Troponin:   Recent Labs  Lab 01/01/23 1029 01/01/23 1239 01/14/23 1317 01/25/23 2340 01/26/23 0120  TROPONINIHS 11 10 17 14 8       Chemistry Recent Labs  Lab 01/25/23 2340 01/26/23 0542 01/27/23 0618  NA 138 139 137  K 4.8 4.2 4.2  CL 104 108 104  CO2 22 22 22   GLUCOSE 104* 101* 97  BUN 41* 36* 37*  CREATININE 2.53* 2.14* 2.06*  CALCIUM 8.9 8.7* 8.4*  MG 2.6*  --   --   GFRNONAA 18* 22* 23*  ANIONGAP 12 9 11     Recent Labs  Lab 01/26/23 0120 01/26/23 0542 01/27/23 0618  PROT 5.9* 5.8* 5.6*  ALBUMIN 3.4* 3.4* 3.3*  AST 44* 44* 46*  ALT 38 40 47*  ALKPHOS 56 55 57  BILITOT 0.7 0.5 0.2*   Lipids No results for input(s): "CHOL", "TRIG", "HDL", "LABVLDL", "LDLCALC", "CHOLHDL" in the last 168 hours.  Hematology Recent Labs  Lab 01/25/23 2340 01/26/23  4098 01/27/23 0618  WBC 4.6 8.6 6.1  RBC 2.73* 4.00  3.92 3.61*  HGB 8.1* 11.5* 10.8*  HCT 26.2* 36.1 33.7*  MCV 96.0 90.3 93.4  MCH 29.7 28.8 29.9  MCHC 30.9 31.9 32.0  RDW 16.1* 15.9* 15.9*  PLT 145* 245 213   Thyroid  Recent Labs  Lab 01/26/23 0120  TSH 1.610    BNPNo results for input(s): "BNP", "PROBNP" in the last 168 hours.  DDimer No results for input(s): "DDIMER" in the last 168 hours.   Radiology/Studies:  US Abdomen Limited RUQ (LIVER/GB) Result Date: 01/26/2023 CLINICAL DATA:  Right upper quadrant pain EXAM: ULTRASOUND ABDOMEN LIMITED RIGHT UPPER QUADRANT COMPARISON:  CT earlier today FINDINGS: Gallbladder: 6 mm stone in the gallbladder neck region. 9 mm polyp noted. Mild gallbladder wall thickening at 4 mm. Common bile duct: Diameter: Upper limits normal in size for patient's age measuring up to 7-8 mm. Liver: No focal lesion identified. Within normal limits in parenchymal echogenicity. Portal vein is patent on color Doppler imaging with normal direction of blood flow towards the liver. Other: None. IMPRESSION: Small gallstone. 9 mm gallbladder wall polyp. Mild gallbladder wall thickening. Recommend clinical correlation for possible early cholecystitis. Electronically Signed   By: Charlett Nose M.D.   On: 01/26/2023 03:11   CT  ABDOMEN PELVIS WO CONTRAST Result Date: 01/26/2023 CLINICAL DATA:  Abdominal pain, acute, to the right flank EXAM: CT ABDOMEN AND PELVIS WITHOUT CONTRAST CT LUMBAR SPINE WITHOUT CONTRAST TECHNIQUE: Multidetector CT imaging of the abdomen and pelvis was performed following the standard protocol without IV contrast. Multidetector CT imaging of the lumbar spine was performed without intravenous contrast administration. Multiplanar CT image reconstructions were also generated. RADIATION DOSE REDUCTION: This exam was performed according to the departmental dose-optimization program which includes automated exposure control, adjustment of the mA and/or kV according to patient size and/or use of iterative reconstruction technique. COMPARISON:  CT abdomen and pelvis 01/01/2023 FINDINGS: ABDOMEN/PELVIS: Lower chest: Cardiomegaly. Interlobular septal thickening in the lower lungs. Trace left effusion. Hepatobiliary: Small amount of pericholecystic fluid, slightly increased from prior. No radiopaque stone. Stable caliber biliary ducts. Scattered hepatic cysts. Pancreas: Unremarkable. Spleen: Unremarkable. Adrenals/Urinary Tract: Stable adrenal glands. Unchanged left nephrolithiasis. No obstructing ureteral calculi. No hydronephrosis. Interpolar right kidney 1.8 cm hyperdense hemorrhagic or proteinaceous cyst. No specific follow-up imaging recommended. Unremarkable bladder. Stomach/Bowel: Normal caliber large and small bowel. No bowel wall thickening. Stomach is within normal limits. The appendix is not visualized. Vascular/Lymphatic: Aortic atherosclerosis. No enlarged abdominal or pelvic lymph nodes. Reproductive: Hysterectomy.  No adnexal mass. Other: No free intraperitoneal fluid or air. Musculoskeletal: No acute fracture. LUMBAR SPINE: Segmentation: 5 lumbar type vertebrae. Alignment: Unchanged alignment from 01/01/2023. No evidence of traumatic listhesis. Vertebrae: No acute fracture. Chronic compression fracture of T12  status post vertebroplasty. Paraspinal and other soft tissues: Reported above. Disc levels: Multilevel spondylosis. Advanced disc space height loss and degenerative endplate changes at L5-S1. Advanced facet arthropathy throughout the lumbar spine. Posterior disc extrusion at L5-S1 causes narrowing of the left lateral recess mild spinal canal narrowing, similar to MRI 08/05/2021. IMPRESSION: 1. Small amount of pericholecystic fluid, slightly increased from prior. No radiopaque stone. If there is clinical concern for acute cholecystitis, consider further evaluation with right upper quadrant ultrasound. 2. Unchanged left nephrolithiasis. No obstructing ureteral calculi. No hydronephrosis. 3. No acute fracture or traumatic listhesis in the lumbar spine. 4. Cardiomegaly with interlobular septal thickening in the lower lungs and trace left effusion, suggestive of pulmonary edema. Aortic  Atherosclerosis (ICD10-I70.0). Electronically Signed   By: Minerva Fester M.D.   On: 01/26/2023 01:13   DG Chest Port 1 View Result Date: 01/25/2023 CLINICAL DATA:  cp EXAM: PORTABLE CHEST 1 VIEW COMPARISON:  CT angio chest 01/01/2023, chest x-ray 824 FINDINGS: Cardiac paddles overlie the chest. The heart and mediastinal contours are unchanged. Aortic calcification. No focal consolidation. Chronic coarsened interstitial markings with no overt pulmonary edema. No pleural effusion. No pneumothorax. No acute osseous abnormality. IMPRESSION: No active disease. Electronically Signed   By: Tish Frederickson M.D.   On: 01/25/2023 23:36     Assessment and Plan:   Bradycardia History of the same it seems Unclear symptoms Now off flecainide this admission (she has had one EKG in Aug with wide QRS, perhaps no longer the drug for her?) bradycardia/junctional rhythm, appears predominantly nocturnal  Tachy-brady Recommend PPM Dr. Lalla Brothers has seen her, discussed PPM, and perhaps tomorrow, pending her GB findings/HIDA result NPO after  MN    Paroxysmal AFib CHA2DS2Vasc is 6, home eliquis held for galbladder w/u/decision Suspect we may run into this off her meds   HIDA scan is done Final diagnosis/recommendations for her GB pending She is afebrile, WBC is normal     Risk Assessment/Risk Scores:    For questions or updates, please contact [facility] Please consult [email or web address] for contact info under    Signed, Sheilah Pigeon, PA-C  01/27/2023 3:25 PM

## 2023-01-28 ENCOUNTER — Encounter (HOSPITAL_COMMUNITY)
Admission: EM | Disposition: A | Discharge status: Skilled Nursing Facility | Payer: Self-pay | Source: Home / Self Care | Attending: Family Medicine

## 2023-01-28 DIAGNOSIS — K810 Acute cholecystitis: Secondary | ICD-10-CM | POA: Diagnosis not present

## 2023-01-28 DIAGNOSIS — I495 Sick sinus syndrome: Secondary | ICD-10-CM | POA: Diagnosis not present

## 2023-01-28 HISTORY — PX: PACEMAKER IMPLANT: EP1218

## 2023-01-28 LAB — COMPREHENSIVE METABOLIC PANEL
ALT: 41 U/L (ref 0–44)
AST: 35 U/L (ref 15–41)
Albumin: 3.4 g/dL — ABNORMAL LOW (ref 3.5–5.0)
Alkaline Phosphatase: 55 U/L (ref 38–126)
Anion gap: 13 (ref 5–15)
BUN: 26 mg/dL — ABNORMAL HIGH (ref 8–23)
CO2: 19 mmol/L — ABNORMAL LOW (ref 22–32)
Calcium: 8.2 mg/dL — ABNORMAL LOW (ref 8.9–10.3)
Chloride: 108 mmol/L (ref 98–111)
Creatinine, Ser: 1.34 mg/dL — ABNORMAL HIGH (ref 0.44–1.00)
GFR, Estimated: 39 mL/min — ABNORMAL LOW (ref >60)
Glucose, Bld: 93 mg/dL (ref 70–99)
Potassium: 3.9 mmol/L (ref 3.5–5.1)
Sodium: 140 mmol/L (ref 135–145)
Total Bilirubin: 0.6 mg/dL (ref 0.3–1.2)
Total Protein: 5.7 g/dL — ABNORMAL LOW (ref 6.5–8.1)

## 2023-01-28 LAB — CBC
HCT: 36.5 % (ref 36.0–46.0)
Hemoglobin: 11.7 g/dL — ABNORMAL LOW (ref 12.0–15.0)
MCH: 29 pg (ref 26.0–34.0)
MCHC: 32.1 g/dL (ref 30.0–36.0)
MCV: 90.3 fL (ref 80.0–100.0)
Platelets: 253 10*3/uL (ref 150–400)
RBC: 4.04 MIL/uL (ref 3.87–5.11)
RDW: 15.9 % — ABNORMAL HIGH (ref 11.5–15.5)
WBC: 6.2 10*3/uL (ref 4.0–10.5)
nRBC: 0 % (ref 0.0–0.2)

## 2023-01-28 LAB — GLUCOSE, CAPILLARY: Glucose-Capillary: 89 mg/dL (ref 70–99)

## 2023-01-28 SURGERY — PACEMAKER IMPLANT

## 2023-01-28 MED ORDER — LIDOCAINE HCL (PF) 1 % IJ SOLN
INTRAMUSCULAR | Status: AC
  Filled 2023-01-28: qty 60

## 2023-01-28 MED ORDER — FENTANYL CITRATE (PF) 100 MCG/2ML IJ SOLN
INTRAMUSCULAR | Status: DC | PRN
  Administered 2023-01-28: 25 ug via INTRAVENOUS
  Administered 2023-01-28: 12.5 ug via INTRAVENOUS

## 2023-01-28 MED ORDER — MIDAZOLAM HCL 2 MG/2ML IJ SOLN
INTRAMUSCULAR | Status: AC
  Filled 2023-01-28: qty 2

## 2023-01-28 MED ORDER — SODIUM CHLORIDE 0.9 % IV SOLN
INTRAVENOUS | Status: AC
  Administered 2023-01-28: 80 mg
  Filled 2023-01-28: qty 2

## 2023-01-28 MED ORDER — BISACODYL 10 MG RE SUPP
10.0000 mg | Freq: Every day | RECTAL | Status: DC | PRN
  Administered 2023-01-29: 10 mg via RECTAL
  Filled 2023-01-28: qty 1

## 2023-01-28 MED ORDER — POLYETHYLENE GLYCOL 3350 17 G PO PACK
17.0000 g | PACK | Freq: Two times a day (BID) | ORAL | Status: DC
  Administered 2023-01-28 – 2023-02-03 (×12): 17 g via ORAL
  Filled 2023-01-28 (×11): qty 1

## 2023-01-28 MED ORDER — HEPARIN (PORCINE) IN NACL 1000-0.9 UT/500ML-% IV SOLN
INTRAVENOUS | Status: DC | PRN
  Administered 2023-01-28: 500 mL

## 2023-01-28 MED ORDER — MIDAZOLAM HCL 5 MG/5ML IJ SOLN
INTRAMUSCULAR | Status: DC | PRN
  Administered 2023-01-28: 1 mg via INTRAVENOUS
  Administered 2023-01-28: 2 mg via INTRAVENOUS

## 2023-01-28 MED ORDER — FENTANYL CITRATE (PF) 100 MCG/2ML IJ SOLN
INTRAMUSCULAR | Status: AC
  Filled 2023-01-28: qty 2

## 2023-01-28 MED ORDER — LIDOCAINE HCL (PF) 1 % IJ SOLN
INTRAMUSCULAR | Status: DC | PRN
  Administered 2023-01-28: 60 mL

## 2023-01-28 MED ORDER — CEFAZOLIN SODIUM-DEXTROSE 2-4 GM/100ML-% IV SOLN
INTRAVENOUS | Status: AC
  Administered 2023-01-28: 2 g via INTRAVENOUS
  Filled 2023-01-28: qty 100

## 2023-01-28 SURGICAL SUPPLY — 14 items
CABLE SURGICAL S-101-97-12 (CABLE) ×1 IMPLANT
CATH RIGHTSITE C315HIS02 (CATHETERS) IMPLANT
IPG PACE AZUR XT DR MRI W1DR01 (Pacemaker) IMPLANT
LEAD CAPSURE NOVUS 5076-52CM (Lead) IMPLANT
LEAD SELECT SECURE 3830 383069 (Lead) IMPLANT
MAT PREVALON FULL STRYKER (MISCELLANEOUS) IMPLANT
PACE AZURE XT DR MRI W1DR01 (Pacemaker) ×1 IMPLANT
PAD DEFIB RADIO PHYSIO CONN (PAD) ×1 IMPLANT
SELECT SECURE 3830 383069 (Lead) ×1 IMPLANT
SHEATH 7FR PRELUDE SNAP 13 (SHEATH) IMPLANT
SHEATH 7FR PRELUDE SNAP 25 (SHEATH) IMPLANT
SLITTER 6232ADJ (MISCELLANEOUS) IMPLANT
TRAY PACEMAKER INSERTION (PACKS) ×1 IMPLANT
WIRE HI TORQ VERSACORE-J 145CM (WIRE) IMPLANT

## 2023-01-28 NOTE — Evaluation (Signed)
Physical Therapy Evaluation Patient Details Name: [name] MRN: [id number] DOB: 1935-12-26 Today's Date: 01/28/2023  History of Present Illness  87 y.o. female presents to [facility] hospital on 01/25/2023 with abdominal pain. Pt found to have gall stones. PMH includes CKD IV, HTN, PAF, hypothyroidism, HLD, CVA.  Clinical Impression  PT presents to PT with deficits in functional mobility, gait, balance, strength, power, endurance, coordination. Pt with intermittent deficits in strength and coordination in RUE and RLE during session, with periods of ataxic gait and intention tremor in RUE. Pt is also limited by abdominal pain, demonstrating poor tolerance for mobility at this time. Pt is at a high risk for falls due to endurance, strength and coordination deficits. PT anticipates the pt will have great difficulty managing ADLs, especially with mobility restrictions of LUE after planned PPM placement. Pt has little caregiver support available at home. PT recommends short term inpatient PT services at the time of discharge.        If plan is discharge home, recommend the following: A little help with walking and/or transfers;A lot of help with bathing/dressing/bathroom;Assistance with cooking/housework;Assist for transportation;Help with stairs or ramp for entrance   Can travel by private vehicle   Yes    Equipment Recommendations None recommended by PT  Recommendations for Other Services       Functional Status Assessment Patient has had a recent decline in their functional status and demonstrates the ability to make significant improvements in function in a reasonable and predictable amount of time.     Precautions / Restrictions Precautions Precautions: Fall Restrictions Weight Bearing Restrictions: No      Mobility  Bed Mobility               General bed mobility comments: not assessed, pt received and left in sitting    Transfers Overall transfer level: Needs  assistance Equipment used: None Transfers: Sit to/from Stand, Bed to chair/wheelchair/BSC Sit to Stand: Contact guard assist   Step pivot transfers: Contact guard assist            Ambulation/Gait Ambulation/Gait assistance: Contact guard assist Gait Distance (Feet): 20 Feet Assistive device:  (PRN support of furniture) Gait Pattern/deviations: Step-to pattern, Ataxic Gait velocity: reduced Gait velocity interpretation: <1.31 ft/sec, indicative of household ambulator   General Gait Details: pt with slowed step-to gait, intermittent periods of great effort to advance RLE with ataxic pattern, other times with smooth step-through pattern with RLE  Stairs            Wheelchair Mobility     Tilt Bed    Modified Rankin (Stroke Patients Only)       Balance Overall balance assessment: Needs assistance Sitting-balance support: No upper extremity supported, Feet supported Sitting balance-Leahy Scale: Good     Standing balance support: No upper extremity supported, During functional activity Standing balance-Leahy Scale: Fair                               Pertinent Vitals/Pain Pain Assessment Pain Assessment: Faces Faces Pain Scale: Hurts even more Pain Location: RUQ Pain Descriptors / Indicators: Aching Pain Intervention(s): Monitored during session    Home Living Family/patient expects to be discharged to:: Private residence Living Arrangements: Alone Available Help at Discharge: Family;Available PRN/intermittently (brother, is also in poor physical condition per patient) Type of Home: Apartment Home Access: Level entry       Home Layout: One level Home Equipment: Cane - single  point;Shower seat;BSC/3in1;Hospital bed      Prior Function Prior Level of Function : Independent/Modified Independent             Mobility Comments: ambulatory with use of [facility]       Extremity/Trunk Assessment   Upper Extremity Assessment Upper Extremity  Assessment: Right hand dominant;RUE deficits/detail;LUE deficits/detail RUE Deficits / Details: pt with 4-/5 RUE strength with formal MMT, intermittent intention tremor noted LUE Deficits / Details: grossly 4/5, ROM WFL    Lower Extremity Assessment Lower Extremity Assessment: RLE deficits/detail RLE Deficits / Details: knee extension/flexion 4-/5, hip flexion 3/5, ankle PF/DF 1/5 with formal assessment however no foot drag noted with ambulation    Cervical / Trunk Assessment Cervical / Trunk Assessment: Kyphotic  Communication   Communication Communication: Hearing impairment Cueing Techniques: Verbal cues  Cognition Arousal: Alert Behavior During Therapy: WFL for tasks assessed/performed Overall Cognitive Status: Within Functional Limits for tasks assessed                                          General Comments General comments (skin integrity, edema, etc.): VSS on RA, HR in 80s when mobilizing    Exercises     Assessment/Plan    PT Assessment Patient needs continued PT services  PT Problem List Decreased strength;Decreased activity tolerance;Decreased mobility;Decreased balance;Decreased coordination;Decreased knowledge of use of DME;Pain       PT Treatment Interventions DME instruction;Gait training;Functional mobility training;Therapeutic activities;Therapeutic exercise;Balance training;Neuromuscular re-education;Patient/family education    PT Goals (Current goals can be found in the Care Plan section)  Acute Rehab PT Goals Patient Stated Goal: to improve strength and activity tolerance PT Goal Formulation: With patient Time For Goal Achievement: 02/11/23 Potential to Achieve Goals: Fair    Frequency Min 1X/week     Co-evaluation               AM-PAC PT "6 Clicks" Mobility  Outcome Measure Help needed turning from your back to your side while in a flat bed without using bedrails?: A Little Help needed moving from lying on your back to  sitting on the side of a flat bed without using bedrails?: A Little Help needed moving to and from a bed to a chair (including a wheelchair)?: A Little Help needed standing up from a chair using your arms (e.g., wheelchair or bedside chair)?: A Little Help needed to walk in hospital room?: A Little Help needed climbing 3-5 steps with a railing? : Total 6 Click Score: 16    End of Session Equipment Utilized During Treatment: Gait belt Activity Tolerance: Patient limited by fatigue;Patient limited by pain Patient left: in bed;with call bell/phone within reach;with bed alarm set Nurse Communication: Mobility status PT Visit Diagnosis: Other abnormalities of gait and mobility (R26.89);Muscle weakness (generalized) (M62.81);Other symptoms and signs involving the nervous system (R29.898)    Time: 1610-9604 PT Time Calculation (min) (ACUTE ONLY): 22 min   Charges:   PT Evaluation $PT Eval Low Complexity: 1 Low   PT General Charges $$ ACUTE PT VISIT: 1 Visit         Arlyss Gandy, PT, DPT Acute Rehabilitation Office [phone]   Arlyss Gandy 01/28/2023, 10:25 AM

## 2023-01-28 NOTE — Progress Notes (Signed)
PROGRESS NOTE    [name]  ZOX:[id number] DOB: 1935/10/17 DOA: 01/25/2023 PCP: Benita Stabile, MD  Chief Complaint  Patient presents with   Abdominal Pain    Brief Narrative:   [name] is [name] 87 y.o. female with medical history CKD stage IV, significant of essential hypertension, paroxysmal atrial fibrillation on Eliquis 2.5 mg twice daily, hypothyroidism, hyperlipidemia, history of CVA/TIA, and chronic anemia presented to emergency department for evaluation for abdominal pain.  Initial suspicion for cholecystitis, but hida scan negative.  Concern for contribution from constipation?  Pending pacemaker placement then possible discharge pending symptoms.   Assessment & Plan:   Principal Problem:   Acute cholecystitis Active Problems:   Acute anemia   Hypothyroidism   History of CVA (cerebrovascular accident)   Hyperlipidemia   Memory impairment   CKD (chronic kidney disease), stage IV (HCC)   Acute kidney injury superimposed on CKD (HCC)   Atrial fibrillation, chronic (HCC)   Chronic back pain due to kyphosis  Tachybradycardia Syndrome Symptomatic Bradycardia History of Paroxysmal Atrial Fibrillation Appreciate cardiology assistance, planning for permanent pacemaker today Echo with EF 60-65%, no RWMA, grade II diastolic dysfunction Home eliquis currently on hold Flecainide is on hold   Abdominal Pain CT abd/pelvis with pericholecystic fluid, RUQ [place] with small gallstone, 9 mm gallbladder wall polyp, mild gallbladder wall thickening HIDA without common duct or cystic duct obstruction She notes constipation recently, this maybe contributing to her discomfort Bowel regimen Consider GI follow up outpatient   Mild normocytic anemia: Hb trending 10-11 here.  Normal iron, b12, folate.  Trend outpatient.   History Stroke Noted, eliquis on hold  AKI on CKD stage IIIb: Baseline creatinine around 1.3-1.9, she presented with 2.53 which is improved to 2.06.  Back  to baseline today.  Chronic grade 3 diastolic heart failure: Appears to be euvolemic.  Holding Lasix and losartan in the setting of AKI -> can resume at discharge or in the next day or so.   Hypothyroidism, acquired: Continue Synthroid.   Hyperlipidemia: Continue ezetimibe.   Memory impairment: Continue memantine.  She is currently fully alert and oriented.   Chronic low back pain and right hip pain: Patient states that she lives with the pain.  Current pain seems to be at her baseline.   Essential hypertension: Stable.  Continue home dose of amlodipine but holding losartan and lasix.    DVT prophylaxis: SCD Code Status: full Family Communication: none Disposition:   Status is: Inpatient Remains inpatient appropriate because: none   Consultants:  Cardiology surgery  Procedures:  Echo IMPRESSIONS     1. Left ventricular ejection fraction, by estimation, is 60 to 65%. The  left ventricle has normal function. The left ventricle has no regional  wall motion abnormalities. Left ventricular diastolic parameters are  consistent with Grade II diastolic  dysfunction (pseudonormalization).   2. Right ventricular systolic function is normal. The right ventricular  size is normal. There is mildly elevated pulmonary artery systolic  pressure. The estimated right ventricular systolic pressure is 43.1 mmHg.   3. Left atrial size was moderately dilated.   4. The mitral valve is normal in structure. Mild mitral valve  regurgitation. No evidence of mitral stenosis.   5. The aortic valve is calcified. There is mild calcification of the  aortic valve. Aortic valve regurgitation is trivial. Aortic valve  sclerosis/calcification is present, without any evidence of aortic  stenosis.   6. The inferior vena cava is dilated in size with <50%  respiratory  variability, suggesting right atrial pressure of 15 mmHg.   Comparison(s): No significant change from prior study.  Antimicrobials:   Anti-infectives (From admission, onward)    Start     Dose/Rate Route Frequency Ordered Stop   01/28/23 0900  gentamicin (GARAMYCIN) 80 mg in sodium chloride 0.9 % 500 mL irrigation        80 mg Irrigation On call 01/27/23 1939 01/29/23 0900   01/28/23 0900  ceFAZolin (ANCEF) IVPB 2g/100 mL premix        2 g 200 mL/hr over 30 Minutes Intravenous On call 01/27/23 1939 01/29/23 0900   01/26/23 0400  cefTRIAXone (ROCEPHIN) 2 g in sodium chloride 0.9 % 100 mL IVPB  Status:  Discontinued        2 g 200 mL/hr over 30 Minutes Intravenous Every 24 hours 01/26/23 0348 01/27/23 1717   01/26/23 0400  metroNIDAZOLE (FLAGYL) IVPB 500 mg  Status:  Discontinued        500 mg 100 mL/hr over 60 Minutes Intravenous Every 12 hours 01/26/23 0348 01/27/23 0957       Subjective: C/o chronic back pain Abd pain she attributes to constipation Pain from not having bra on   Objective: Vitals:   01/27/23 1625 01/27/23 2008 01/28/23 0544 01/28/23 0545  BP: 138/68 (!) 124/51 (!) 158/103 (!) 160/64  Pulse: 77 74 87 85  Resp: 18     Temp: 97.6 F (36.4 C) 97.7 F (36.5 C) 97.9 F (36.6 C)   TempSrc: Oral Oral Oral   SpO2: 92% 99% 94% 95%  Weight:      Height:        Intake/Output Summary (Last 24 hours) at 01/28/2023 0845 Last data filed at 01/28/2023 0600 Gross per 24 hour  Intake 1441.8 ml  Output 325 ml  Net 1116.8 ml   Filed Weights   01/26/23 0522  Weight: 62.9 kg    Examination:  General exam: Appears calm and comfortable  Respiratory system: unlabored Cardiovascular system: RRR Gastrointestinal system: mildly distended, diffuse mild TTP  Central nervous system: Alert and oriented. No focal neurological deficits. Extremities: trace LE edema   Data Reviewed: I have personally reviewed following labs and imaging studies  CBC: Recent Labs  Lab 01/25/23 2340 01/26/23 0537 01/27/23 0618 01/28/23 0632  WBC 4.6 8.6 6.1 6.2  HGB 8.1* 11.5* 10.8* 11.7*  HCT 26.2* 36.1 33.7* 36.5   MCV 96.0 90.3 93.4 90.3  PLT 145* 245 213 253    Basic Metabolic Panel: Recent Labs  Lab 01/25/23 2340 01/26/23 0542 01/27/23 0618 01/28/23 0632  NA 138 139 137 140  K 4.8 4.2 4.2 3.9  CL 104 108 104 108  CO2 22 22 22  19*  GLUCOSE 104* 101* 97 93  BUN 41* 36* 37* 26*  CREATININE 2.53* 2.14* 2.06* 1.34*  CALCIUM 8.9 8.7* 8.4* 8.2*  MG 2.6*  --   --   --     GFR: Estimated Creatinine Clearance: 24.8 mL/min (Arney Mayabb) (by C-G formula based on SCr of 1.34 mg/dL (H)).  Liver Function Tests: Recent Labs  Lab 01/25/23 2340 01/26/23 0120 01/26/23 0542 01/27/23 0618 01/28/23 0632  AST 57* 44* 44* 46* 35  ALT 38 38 40 47* 41  ALKPHOS 58 56 55 57 55  BILITOT 0.6 0.7 0.5 0.2* 0.6  PROT 5.8* 5.9* 5.8* 5.6* 5.7*  ALBUMIN 3.5 3.4* 3.4* 3.3* 3.4*    CBG: No results for input(s): "GLUCAP" in the last 168 hours.   Recent Results (  from the past 240 hour(s))  Culture, blood (Routine X 2) w Reflex to ID Panel     Status: None (Preliminary result)   Collection Time: 01/26/23  5:35 AM   Specimen: BLOOD  Result Value Ref Range Status   Specimen Description BLOOD BLOOD RIGHT HAND  Final   Special Requests   Final    BOTTLES DRAWN AEROBIC AND ANAEROBIC Blood Culture adequate volume   Culture   Final    NO GROWTH 2 DAYS Performed at [facility], [address], [place], [place] [address]    Report Status PENDING  Incomplete  Culture, blood (Routine X 2) w Reflex to ID Panel     Status: None (Preliminary result)   Collection Time: 01/26/23  5:37 AM   Specimen: BLOOD  Result Value Ref Range Status   Specimen Description BLOOD BLOOD LEFT HAND  Final   Special Requests   Final    BOTTLES DRAWN AEROBIC AND ANAEROBIC Blood Culture adequate volume   Culture   Final    NO GROWTH 2 DAYS Performed at [facility], [address] [place], [place] [address]    Report Status PENDING  Incomplete  Surgical PCR screen     Status: None   Collection Time: 01/27/23  7:39 PM    Specimen: Nasal Mucosa; Nasal Swab  Result Value Ref Range Status   MRSA, PCR NEGATIVE NEGATIVE Final   Staphylococcus aureus NEGATIVE NEGATIVE Final    Comment: (NOTE) The Xpert SA Assay (FDA approved for NASAL specimens in patients 3 years of age and older), is one component of [facility] comprehensive surveillance program. It is not intended to diagnose infection nor to guide or monitor treatment. Performed at [facility], [address] [place], [place] [address]          Radiology Studies: NM Hepatobiliary Liver Func  Result Date: 01/27/2023 CLINICAL DATA:  Cholelithiasis EXAM: NUCLEAR MEDICINE HEPATOBILIARY IMAGING TECHNIQUE: Sequential images of the abdomen were obtained out to 60 minutes following intravenous administration of radiopharmaceutical. RADIOPHARMACEUTICALS:  5.3 mCi Tc-10m  Choletec IV COMPARISON:  Ultrasound and CT 01/26/2023 FINDINGS: Prompt uptake and biliary excretion of activity by the liver is seen. Gallbladder activity is visualized, consistent with patency of cystic duct. Biliary activity passes into small bowel, consistent with patent common bile duct. IMPRESSION: No common duct or cystic duct obstruction. Electronically Signed   By: Karen Kays M.D.   On: 01/27/2023 15:52   ECHOCARDIOGRAM COMPLETE  Result Date: 01/27/2023    ECHOCARDIOGRAM REPORT   Patient Name:   [name] [facility] Date of Exam: 01/27/2023 Medical Rec #:  [id number]            Height:       59.0 in Accession #:    [id number]           Weight:       138.7 lb Date of Birth:  11/03/1935            BSA:          1.579 m Patient Age:    87 years             BP:           126/57 mmHg Patient Gender: F                    HR:           65 bpm. Exam Location:  Inpatient Procedure: 2D  Echo, Cardiac Doppler, Color Doppler and Strain Analysis Indications:    Bradycardia  History:        Patient has prior history of Echocardiogram examinations, most                 recent 03/07/2022. CHF, CAD, Stroke and TIA,  Arrythmias:Atrial                 Fibrillation, Signs/Symptoms:Chest Pain; Risk                 Factors:Hypertension and Dyslipidemia. CKD, stage 4                 Brast cancer.  Sonographer:    Lucendia Herrlich Referring Phys: [id number] SUBRINA SUNDIL  Sonographer Comments: Image acquisition challenging due to patient body habitus and Image acquisition challenging due to respiratory motion. IMPRESSIONS  1. Left ventricular ejection fraction, by estimation, is 60 to 65%. The left ventricle has normal function. The left ventricle has no regional wall motion abnormalities. Left ventricular diastolic parameters are consistent with Grade II diastolic dysfunction (pseudonormalization).  2. Right ventricular systolic function is normal. The right ventricular size is normal. There is mildly elevated pulmonary artery systolic pressure. The estimated right ventricular systolic pressure is 43.1 mmHg.  3. Left atrial size was moderately dilated.  4. The mitral valve is normal in structure. Mild mitral valve regurgitation. No evidence of mitral stenosis.  5. The aortic valve is calcified. There is mild calcification of the aortic valve. Aortic valve regurgitation is trivial. Aortic valve sclerosis/calcification is present, without any evidence of aortic stenosis.  6. The inferior vena cava is dilated in size with <50% respiratory variability, suggesting right atrial pressure of 15 mmHg. Comparison(s): No significant change from prior study. FINDINGS  Left Ventricle: Left ventricular ejection fraction, by estimation, is 60 to 65%. The left ventricle has normal function. The left ventricle has no regional wall motion abnormalities. The left ventricular internal cavity size was normal in size. There is  no left ventricular hypertrophy. Left ventricular diastolic parameters are consistent with Grade II diastolic dysfunction (pseudonormalization). Right Ventricle: The right ventricular size is normal. No increase in right ventricular  wall thickness. Right ventricular systolic function is normal. There is mildly elevated pulmonary artery systolic pressure. The tricuspid regurgitant velocity is 2.65  m/s, and with an assumed right atrial pressure of 15 mmHg, the estimated right ventricular systolic pressure is 43.1 mmHg. Left Atrium: Left atrial size was moderately dilated. Right Atrium: Right atrial size was normal in size. Pericardium: There is no evidence of pericardial effusion. Mitral Valve: The mitral valve is normal in structure. Mild to moderate mitral annular calcification. Mild mitral valve regurgitation, with centrally-directed jet. No evidence of mitral valve stenosis. MV peak gradient, 13.8 mmHg. The mean mitral valve gradient is 3.0 mmHg. Tricuspid Valve: The tricuspid valve is normal in structure. Tricuspid valve regurgitation is trivial. Aortic Valve: The aortic valve is calcified. There is mild calcification of the aortic valve. Aortic valve regurgitation is trivial. Aortic valve sclerosis/calcification is present, without any evidence of aortic stenosis. Aortic valve mean gradient measures 7.5 mmHg. Aortic valve peak gradient measures 12.9 mmHg. Aortic valve area, by VTI measures 1.93 cm. Pulmonic Valve: The pulmonic valve was not well visualized. Pulmonic valve regurgitation is not visualized. No evidence of pulmonic stenosis. Aorta: The aortic root and ascending aorta are structurally normal, with no evidence of dilitation. Venous: The inferior vena cava is dilated in size with less than 50% respiratory variability,  suggesting right atrial pressure of 15 mmHg. IAS/Shunts: No atrial level shunt detected by color flow Doppler.  LEFT VENTRICLE PLAX 2D LVIDd:         4.00 cm   Diastology LVIDs:         2.50 cm   LV e' medial:    7.62 cm/s LV PW:         1.10 cm   LV E/e' medial:  19.6 LV IVS:        1.00 cm   LV e' lateral:   8.70 cm/s LVOT diam:     1.90 cm   LV E/e' lateral: 17.1 LV SV:         85 LV SV Index:   54        2D  Longitudinal Strain LVOT Area:     2.84 cm  2D Strain GLS Avg:     -16.3 %  RIGHT VENTRICLE             IVC RV S prime:     11.30 cm/s  IVC diam: 2.10 cm TAPSE (M-mode): 2.0 cm LEFT ATRIUM             Index        RIGHT ATRIUM           Index LA diam:        4.10 cm 2.60 cm/m   RA Area:     18.90 cm LA Vol (A2C):   67.5 ml 42.76 ml/m  RA Volume:   50.10 ml  31.74 ml/m LA Vol (A4C):   64.4 ml 40.80 ml/m LA Biplane Vol: 68.6 ml 43.46 ml/m  AORTIC VALVE AV Area (Vmax):    2.03 cm AV Area (Vmean):   2.02 cm AV Area (VTI):     1.93 cm AV Vmax:           179.50 cm/s AV Vmean:          125.000 cm/s AV VTI:            0.442 m AV Peak Grad:      12.9 mmHg AV Mean Grad:      7.5 mmHg LVOT Vmax:         128.33 cm/s LVOT Vmean:        88.867 cm/s LVOT VTI:          0.301 m LVOT/AV VTI ratio: 0.68  AORTA Ao Root diam: 2.90 cm Ao Asc diam:  3.40 cm MITRAL VALVE                TRICUSPID VALVE MV Area (PHT): 4.06 cm     TR Peak grad:   28.1 mmHg MV Area VTI:   1.87 cm     TR Vmax:        265.00 cm/s MV Peak grad:  13.8 mmHg MV Mean grad:  3.0 mmHg     SHUNTS MV Vmax:       1.86 m/s     Systemic VTI:  0.30 m MV Vmean:      77.6 cm/s    Systemic Diam: 1.90 cm MV Decel Time: 187 msec MR Peak grad: 51.8 mmHg MR Vmax:      360.00 cm/s MV E velocity: 149.00 cm/s MV Albert Devaul velocity: 71.80 cm/s MV E/Kylan Liberati ratio:  2.08 Mihai Croitoru MD Electronically signed by Thurmon Fair MD Signature Date/Time: 01/27/2023/3:34:33 PM    Final         Scheduled Meds:  amLODipine  10  mg Oral Daily   ezetimibe  10 mg Oral Daily   ferrous gluconate  324 mg Oral Daily   gentamicin (GARAMYCIN) 80 mg in sodium chloride 0.9 % 500 mL irrigation  80 mg Irrigation On Call   levothyroxine  75 mcg Oral QAC breakfast   memantine  10 mg Oral BID   pantoprazole  40 mg Oral Daily   polyethylene glycol  17 g Oral BID   sodium chloride flush  3 mL Intravenous Q12H   Continuous Infusions:  sodium chloride     sodium chloride 50 mL/hr at 01/28/23 0019     ceFAZolin (ANCEF) IV       LOS: 2 days    Time spent: over 30 min    Lacretia Nicks, MD [facility]   To contact the attending provider between 7A-7P or the covering provider during after hours 7P-7A, please log into the web site [email or web address] and access using universal [place] password for that web site. If you do not have the password, please call the hospital operator.  01/28/2023, 8:45 AM

## 2023-01-28 NOTE — Progress Notes (Signed)
Occupational Therapy Evaluation Patient Details Name: [name] MRN: [id number] DOB: 1935-11-23 Today's Date: 01/28/2023   History of Present Illness 87 y.o. female presents to [facility] on 01/25/2023 with abdominal pain. Pt found to have gall stones. She is now s/p Pacemaker placement on 01/28/23. PMH includes CKD IV, HTN, PAF, hypothyroidism, HLD, CVA.   Clinical Impression   Pt admitted for above, PTA pt lived alone and reports being independent. Pt now s/p pace maker with LUE restrictions, pt needing Mod to Max A for ADLs and has a decreased recall of precautions. Pt also presented with chronic back and LUE pain, as well as general weakness. Pt with an ataxic RLE upon taking steps at bedside, she reports this has been ongoing from prior TIAs and occasionally her RUE will tremor. Pt would benefit from continued acute skilled OT services to address deficit, reinforce precautions, and help transition to next level of care. Patient would benefit from post acute skilled rehab facility with <3 hours of therapy and 24/7 support       If plan is discharge home, recommend the following: A little help with walking and/or transfers;A lot of help with bathing/dressing/bathroom;Assistance with cooking/housework;Assist for transportation    Functional Status Assessment  Patient has had a recent decline in their functional status and demonstrates the ability to make significant improvements in function in a reasonable and predictable amount of time.  Equipment Recommendations  None recommended by OT (defer to next level of care)    Recommendations for Other Services       Precautions / Restrictions Precautions Precautions: Fall;ICD/Pacemaker Precaution Comments: Pacemaker, log roll for bed mobility Restrictions Weight Bearing Restrictions: Yes Other Position/Activity Restrictions: no push/pull/lift LUE      Mobility Bed Mobility Overal bed mobility: Needs Assistance Bed Mobility:  Rolling, Sidelying to Sit, Sit to Supine Rolling: Min assist Sidelying to sit: Mod assist   Sit to supine: Min assist   General bed mobility comments: Pt prefers to log roll due to back pain    Transfers Overall transfer level: Needs assistance Equipment used: 1 person hand held assist Transfers: Sit to/from Stand Sit to Stand: Min assist           General transfer comment: Min A +2 for lateral steps to [facility], RLE very ataxic and not coordinating side steps appropriately      Balance Overall balance assessment: Needs assistance Sitting-balance support: No upper extremity supported, Feet supported Sitting balance-Leahy Scale: Good     Standing balance support: No upper extremity supported, During functional activity Standing balance-Leahy Scale: Poor Standing balance comment: reliant on ext support                           ADL either performed or assessed with clinical judgement   ADL Overall ADL's : Needs assistance/impaired Eating/Feeding: Bed level;Set up Eating/Feeding Details (indicate cue type and reason): due to LUE limitations Grooming: Sitting;Wash/dry face;Set up   Upper Body Bathing: Minimal assistance;Sitting   Lower Body Bathing: Moderate assistance;Sitting/lateral leans   Upper Body Dressing : Maximal assistance;Sitting Upper Body Dressing Details (indicate cue type and reason): Needs instruction on compensatory strategy following LUE ROM restrictions. Very limited by pain today Lower Body Dressing: Maximal assistance;Sitting/lateral leans     Toilet Transfer Details (indicate cue type and reason): NT   Toileting - Clothing Manipulation Details (indicate cue type and reason): NT   Tub/Shower Transfer Details (indicate cue type and reason): Nt  General ADL Comments: pt with c/o pain throughout her whole L side and back when positioned to sitting EOB. Took time but pt was repositioned comforably in bed, she c/o SOB with increased back pain.  Pt SOB resolved and became calmer with a pillow positioned under her shoulders, one at her head, a small cloth under her back, and pillow positoned underneath her legs with BLEs flexed     Vision         Perception         Praxis         Pertinent Vitals/Pain Pain Assessment Pain Assessment: 0-10 Pain Score: 10-Worst pain ever Pain Location: LUE hurts the most, strong back and RUQ pain as well Pain Descriptors / Indicators: Aching, Discomfort, Grimacing, Guarding, Pressure Pain Intervention(s): Limited activity within patient's tolerance, Monitored during session, Repositioned     Extremity/Trunk Assessment Upper Extremity Assessment Upper Extremity Assessment: Right hand dominant RUE Deficits / Details: pt with 4-/5 RUE strength with formal MMT, intermittent intention tremor noted LUE Deficits / Details: grossly 4/5, ROM WFL. Info gathered from PT session, not assessed in OT session due to s/p pacemaker placement   Lower Extremity Assessment Lower Extremity Assessment: RLE deficits/detail RLE Deficits / Details: knee extension/flexion 4-/5, hip flexion 3/5, ankle PF/DF 1/5 with formal assessment however no foot drag noted with ambulation   Cervical / Trunk Assessment Cervical / Trunk Assessment: Kyphotic   Communication Communication Communication: Hearing impairment   Cognition Arousal: Alert Behavior During Therapy: WFL for tasks assessed/performed Overall Cognitive Status: Within Functional Limits for tasks assessed                                       General Comments  HR in 80s throughout session, handout on pacemaker precautions left in room    Exercises     Shoulder Instructions      Home Living Family/patient expects to be discharged to:: Private residence Living Arrangements: Alone Available Help at Discharge: Family;Available PRN/intermittently (brother, is also in poor physical condition per patient) Type of Home: Apartment Home  Access: Level entry     Home Layout: One level     Bathroom Shower/Tub: Chief Strategy Officer: Standard     Home Equipment: Cane - single point;Shower seat;BSC/3in1;Hospital bed          Prior Functioning/Environment Prior Level of Function : Independent/Modified Independent             Mobility Comments: ambulatory with use of SPC ADLs Comments: ind        OT Problem List: Impaired UE functional use;Decreased range of motion;Impaired balance (sitting and/or standing);Decreased strength;Decreased knowledge of precautions      OT Treatment/Interventions: Self-care/ADL training;Balance training;Therapeutic exercise;Therapeutic activities;Patient/family education    OT Goals(Current goals can be found in the care plan section) Acute Rehab OT Goals Patient Stated Goal: get pain under control OT Goal Formulation: With patient Time For Goal Achievement: 02/11/23 Potential to Achieve Goals: Good ADL Goals Pt Will Perform Grooming: standing;with supervision Pt Will Perform Upper Body Dressing: with modified independence;sitting (using compensatory strategies) Pt Will Perform Lower Body Dressing: with min assist;sit to/from stand Pt Will Transfer to Toilet: ambulating;with contact guard assist Additional ADL Goal #1: pt will independently verablize understanding of pacemaker precautions  OT Frequency: Min 1X/week    Co-evaluation  AM-PAC OT "6 Clicks" Daily Activity     Outcome Measure Help from another person eating meals?: A Little Help from another person taking care of personal grooming?: A Little Help from another person toileting, which includes using toliet, bedpan, or urinal?: A Lot Help from another person bathing (including washing, rinsing, drying)?: A Lot Help from another person to put on and taking off regular upper body clothing?: A Lot Help from another person to put on and taking off regular lower body clothing?: A Lot 6  Click Score: 14   End of Session Equipment Utilized During Treatment: Gait belt;Other (comment) (L sling) Nurse Communication: Mobility status  Activity Tolerance: Patient limited by pain Patient left: in bed;with call bell/phone within reach;with bed alarm set;with family/visitor present  OT Visit Diagnosis: Unsteadiness on feet (R26.81);Other abnormalities of gait and mobility (R26.89);Pain Pain - Right/Left: Left Pain - part of body: Arm                Time: 1610-9604 OT Time Calculation (min): 43 min Charges:  OT General Charges $OT Visit: 1 Visit OT Evaluation $OT Eval Moderate Complexity: 1 Mod OT Treatments $Self Care/Home Management : 8-22 mins $Therapeutic Activity: 8-22 mins  01/28/2023  AB, OTR/L  Acute Rehabilitation Services  Office: [phone]   Tristan Schroeder 01/28/2023, 5:03 PM

## 2023-01-28 NOTE — Progress Notes (Addendum)
Rounding Note    Patient Name: [name] Date of Encounter: 01/28/2023  [facility] Cardiologist: Dina Rich, MD   Subjective   No new complaints, NAEO  Inpatient Medications    Scheduled Meds:  amLODipine  10 mg Oral Daily   ezetimibe  10 mg Oral Daily   ferrous gluconate  324 mg Oral Daily   gentamicin (GARAMYCIN) 80 mg in sodium chloride 0.9 % 500 mL irrigation  80 mg Irrigation On Call   levothyroxine  75 mcg Oral QAC breakfast   memantine  10 mg Oral BID   pantoprazole  40 mg Oral Daily   polyethylene glycol  17 g Oral BID   sodium chloride flush  3 mL Intravenous Q12H   Continuous Infusions:  sodium chloride     sodium chloride 50 mL/hr at 01/28/23 0019    ceFAZolin (ANCEF) IV     PRN Meds: sodium chloride, acetaminophen, albuterol, bisacodyl, methocarbamol, ondansetron **OR** ondansetron (ZOFRAN) IV, oxyCODONE, senna-docusate, sodium chloride flush   Vital Signs    Vitals:   01/27/23 1625 01/27/23 2008 01/28/23 0544 01/28/23 0545  BP: 138/68 (!) 124/51 (!) 158/103 (!) 160/64  Pulse: 77 74 87 85  Resp: 18     Temp: 97.6 F (36.4 C) 97.7 F (36.5 C) 97.9 F (36.6 C)   TempSrc: Oral Oral Oral   SpO2: 92% 99% 94% 95%  Weight:      Height:        Intake/Output Summary (Last 24 hours) at 01/28/2023 0856 Last data filed at 01/28/2023 0600 Gross per 24 hour  Intake 1441.8 ml  Output 325 ml  Net 1116.8 ml      01/26/2023    5:22 AM 01/14/2023   11:52 AM 12/18/2022    9:11 AM  Last 3 Weights  Weight (lbs) 138 lb 10.7 oz 132 lb 4.4 oz 132 lb 12.8 oz  Weight (kg) 62.9 kg 60 kg 60.238 kg      Telemetry    SR/SB 50's-60's - Personally Reviewed  ECG    No new EKGs - Personally Reviewed  Physical Exam   Pt seen/examined by Dr. Ladona Ridgel this am GEN: No acute distress.   Neck: No JVD Cardiac: RRR, no murmurs, rubs, or gallops.  Respiratory: CTA b/l. GI: Soft, nontender, non-distended  MS: No edema; No deformity. Neuro:   Nonfocal  Psych: Normal affect   Labs    High Sensitivity Troponin:   Recent Labs  Lab 01/01/23 1029 01/01/23 1239 01/14/23 1317 01/25/23 2340 01/26/23 0120  TROPONINIHS 11 10 17 14 8      Chemistry Recent Labs  Lab 01/25/23 2340 01/26/23 0120 01/26/23 0542 01/27/23 0618 01/28/23 0632  NA 138  --  139 137 140  K 4.8  --  4.2 4.2 3.9  CL 104  --  108 104 108  CO2 22  --  22 22 19*  GLUCOSE 104*  --  101* 97 93  BUN 41*  --  36* 37* 26*  CREATININE 2.53*  --  2.14* 2.06* 1.34*  CALCIUM 8.9  --  8.7* 8.4* 8.2*  MG 2.6*  --   --   --   --   PROT 5.8*   < > 5.8* 5.6* 5.7*  ALBUMIN 3.5   < > 3.4* 3.3* 3.4*  AST 57*   < > 44* 46* 35  ALT 38   < > 40 47* 41  ALKPHOS 58   < > 55 57 55  BILITOT  0.6   < > 0.5 0.2* 0.6  GFRNONAA 18*  --  22* 23* 39*  ANIONGAP 12  --  9 11 13    < > = values in this interval not displayed.    Lipids No results for input(s): "CHOL", "TRIG", "HDL", "LABVLDL", "LDLCALC", "CHOLHDL" in the last 168 hours.  Hematology Recent Labs  Lab 01/26/23 0537 01/27/23 0618 01/28/23 0632  WBC 8.6 6.1 6.2  RBC 4.00  3.92 3.61* 4.04  HGB 11.5* 10.8* 11.7*  HCT 36.1 33.7* 36.5  MCV 90.3 93.4 90.3  MCH 28.8 29.9 29.0  MCHC 31.9 32.0 32.1  RDW 15.9* 15.9* 15.9*  PLT 245 213 253   Thyroid  Recent Labs  Lab 01/26/23 0120  TSH 1.610    BNPNo results for input(s): "BNP", "PROBNP" in the last 168 hours.  DDimer No results for input(s): "DDIMER" in the last 168 hours.   Radiology    NM Hepatobiliary Liver Func Result Date: 01/27/2023 CLINICAL DATA:  Cholelithiasis EXAM: NUCLEAR MEDICINE HEPATOBILIARY IMAGING TECHNIQUE: Sequential images of the abdomen were obtained out to 60 minutes following intravenous administration of radiopharmaceutical. RADIOPHARMACEUTICALS:  5.3 mCi Tc-37m  Choletec IV COMPARISON:  Ultrasound and CT 01/26/2023 FINDINGS: Prompt uptake and biliary excretion of activity by the liver is seen. Gallbladder activity is visualized,  consistent with patency of cystic duct. Biliary activity passes into small bowel, consistent with patent common bile duct. IMPRESSION: No common duct or cystic duct obstruction. Electronically Signed   By: Karen Kays M.D.   On: 01/27/2023 15:52    Cardiac Studies   01/27/23: TTE 1. Left ventricular ejection fraction, by estimation, is 60 to 65%. The  left ventricle has normal function. The left ventricle has no regional  wall motion abnormalities. Left ventricular diastolic parameters are  consistent with Grade II diastolic  dysfunction (pseudonormalization).   2. Right ventricular systolic function is normal. The right ventricular  size is normal. There is mildly elevated pulmonary artery systolic  pressure. The estimated right ventricular systolic pressure is 43.1 mmHg.   3. Left atrial size was moderately dilated.   4. The mitral valve is normal in structure. Mild mitral valve  regurgitation. No evidence of mitral stenosis.   5. The aortic valve is calcified. There is mild calcification of the  aortic valve. Aortic valve regurgitation is trivial. Aortic valve  sclerosis/calcification is present, without any evidence of aortic  stenosis.   6. The inferior vena cava is dilated in size with <50% respiratory  variability, suggesting right atrial pressure of 15 mmHg.   Comparison(s): No significant change from prior study.    NST: 10/2020 Lexiscan stress is electrically negative for ischemia Myoview scan shows normal perfusion No ischemia or scar LVEF calculated at 88% Low risk study   Echocardiogram: 02/2022 IMPRESSIONS   1. Left ventricular ejection fraction, by estimation, is 60 to 65%. The  left ventricle has normal function. The left ventricle has no regional  wall motion abnormalities. There is mild left ventricular hypertrophy.  Left ventricular diastolic parameters  are consistent with Grade III diastolic dysfunction (restrictive).   2. Right ventricular systolic function  is normal. The right ventricular  size is normal.   3. Left atrial size was mildly dilated.   4. Mitral Valve Area (MVA) = 1.76 cm2. The mitral valve is degenerative.  Mild mitral valve regurgitation. Mild mitral stenosis. Moderate mitral  annular calcification.   5. The aortic valve was not assessed. Aortic valve regurgitation is not  visualized.  Comparison(s): Similar to prior; heart rates low in both studies.   Patient Profile     87 y.o. female with a hx of AFib, HFpEF, HTN, prior stroke, hypothyroidism, CKD (III) admitted with RUQ/abdominal pain, dizziness  Assessment & Plan    Bradycardia Tachy-brady Off flecainide with stable HRs since yesterday afternoon Hx of very symptomatic rapid Afib, will need rate/rhythm management   Planned for PPM today Dr. Ladona Ridgel has seen the patient She remains agreeable       Paroxysmal AFib CHA2DS2Vasc is 6, home eliquis held  Resume post PPM pending pocket stability   3. Abd complaints Symptoms seemed unusual by her description yesterday HIDA negative Surgery has s/o Off ABX C/w IM service  4. Severe chronic back pain As per IM    For questions or updates, please contact [facility] Please consult [email or web address] for contact info under   Signed, Sheilah Pigeon, PA-C  01/28/2023, 8:56 AM    EP Attending  Patient seen and examined. Agree with above. She is well known to me from prior clinic visits. She has developed symptomatic tachy-brady syndrome. I have reviewed the indications/risks/benefits/goals/expectations of PPM insertion and she wishes to proceed.  Sharlot Gowda Taylar Hartsough,MD

## 2023-01-29 ENCOUNTER — Encounter (HOSPITAL_COMMUNITY): Payer: Self-pay | Admitting: Internal Medicine

## 2023-01-29 ENCOUNTER — Inpatient Hospital Stay (HOSPITAL_COMMUNITY): Payer: PPO

## 2023-01-29 DIAGNOSIS — K810 Acute cholecystitis: Secondary | ICD-10-CM | POA: Diagnosis not present

## 2023-01-29 DIAGNOSIS — I495 Sick sinus syndrome: Secondary | ICD-10-CM | POA: Diagnosis not present

## 2023-01-29 LAB — CBC
HCT: 34.9 % — ABNORMAL LOW (ref 36.0–46.0)
Hemoglobin: 11.1 g/dL — ABNORMAL LOW (ref 12.0–15.0)
MCH: 29.7 pg (ref 26.0–34.0)
MCHC: 31.8 g/dL (ref 30.0–36.0)
MCV: 93.3 fL (ref 80.0–100.0)
Platelets: 230 10*3/uL (ref 150–400)
RBC: 3.74 MIL/uL — ABNORMAL LOW (ref 3.87–5.11)
RDW: 15.9 % — ABNORMAL HIGH (ref 11.5–15.5)
WBC: 6.5 10*3/uL (ref 4.0–10.5)
nRBC: 0 % (ref 0.0–0.2)

## 2023-01-29 LAB — COMPREHENSIVE METABOLIC PANEL
ALT: 30 U/L (ref 0–44)
AST: 35 U/L (ref 15–41)
Albumin: 3.1 g/dL — ABNORMAL LOW (ref 3.5–5.0)
Alkaline Phosphatase: 53 U/L (ref 38–126)
Anion gap: 6 (ref 5–15)
BUN: 15 mg/dL (ref 8–23)
CO2: 25 mmol/L (ref 22–32)
Calcium: 8.1 mg/dL — ABNORMAL LOW (ref 8.9–10.3)
Chloride: 109 mmol/L (ref 98–111)
Creatinine, Ser: 1.24 mg/dL — ABNORMAL HIGH (ref 0.44–1.00)
GFR, Estimated: 43 mL/min — ABNORMAL LOW (ref >60)
Glucose, Bld: 114 mg/dL — ABNORMAL HIGH (ref 70–99)
Potassium: 4.5 mmol/L (ref 3.5–5.1)
Sodium: 140 mmol/L (ref 135–145)
Total Bilirubin: 0.7 mg/dL (ref 0.3–1.2)
Total Protein: 5.2 g/dL — ABNORMAL LOW (ref 6.5–8.1)

## 2023-01-29 MED ORDER — METOPROLOL TARTRATE 25 MG PO TABS: 25.0000 mg | ORAL_TABLET | Freq: Two times a day (BID) | ORAL | Status: DC

## 2023-01-29 MED ORDER — ONDANSETRON HCL 4 MG/2ML IJ SOLN: 4.0000 mg | Freq: Four times a day (QID) | INTRAMUSCULAR | Status: DC | PRN

## 2023-01-29 MED ORDER — CEFAZOLIN SODIUM-DEXTROSE 1-4 GM/50ML-% IV SOLN
1.0000 g | Freq: Three times a day (TID) | INTRAVENOUS | Status: AC
  Administered 2023-01-29 – 2023-01-30 (×3): 1 g via INTRAVENOUS
  Filled 2023-01-29 (×3): qty 50

## 2023-01-29 MED ORDER — ACETAMINOPHEN 325 MG PO TABS
325.0000 mg | ORAL_TABLET | ORAL | Status: DC | PRN
  Administered 2023-01-30 – 2023-02-01 (×4): 650 mg via ORAL
  Filled 2023-01-29 (×4): qty 2

## 2023-01-29 MED ORDER — SIMETHICONE 80 MG PO CHEW
80.0000 mg | CHEWABLE_TABLET | Freq: Four times a day (QID) | ORAL | Status: DC | PRN
  Administered 2023-01-29: 80 mg via ORAL
  Filled 2023-01-29: qty 1

## 2023-01-29 MED ORDER — ACETAMINOPHEN 325 MG PO TABS: 325.0000 mg | ORAL_TABLET | ORAL | Status: DC | PRN

## 2023-01-29 MED ORDER — APIXABAN 2.5 MG PO TABS
2.5000 mg | ORAL_TABLET | Freq: Two times a day (BID) | ORAL | Status: DC
  Administered 2023-01-29: 2.5 mg via ORAL
  Filled 2023-01-29: qty 1

## 2023-01-29 NOTE — TOC Progression Note (Signed)
Transition of Care [facility]) - Progression Note    Patient Details  Name: [name] MRN: [id number] Date of Birth: [birth year]  Transition of Care [facility]) CM/SW Contact  Erin Sons, [place] Phone Number: 01/29/2023, 3:36 PM  Clinical Narrative:     CSW attempted to meet with pt to discuss bed offers. Pt busy with other staff.   Expected Discharge Plan: Skilled Nursing Facility Barriers to Discharge: Continued Medical Work up  Expected Discharge Plan and Services       Living arrangements for the past 2 months: Single Family Home                                       Social Determinants of Health (SDOH) Interventions SDOH Screenings   Food Insecurity: No Food Insecurity (01/26/2023)  Housing: Patient Declined (01/26/2023)  Transportation Needs: No Transportation Needs (01/26/2023)  Utilities: Not At Risk (01/26/2023)  Alcohol Screen: Low Risk  (02/06/2022)  Depression (PHQ2-9): Low Risk  (02/06/2022)  Financial Resource Strain: Low Risk  (02/06/2022)  Physical Activity: Inactive (02/06/2022)  Social Connections: Moderately Integrated (02/06/2022)  Stress: No Stress Concern Present (02/06/2022)  Tobacco Use: Low Risk  (01/26/2023)    Readmission Risk Interventions     No data to display

## 2023-01-29 NOTE — Progress Notes (Addendum)
Rounding Note    Patient Name: [name] Date of Encounter: 01/29/2023  [facility] Cardiologist: Dina Rich, MD   Subjective   Back pain remains her primary complaint, no site paine, no SOB, no dizziness She thinks she had a stroke at home prior to coming in with some difficult speech that she has noticed,  she reports at baseline poor memory   Inpatient Medications    Scheduled Meds:  amLODipine  10 mg Oral Daily   ezetimibe  10 mg Oral Daily   ferrous gluconate  324 mg Oral Daily   levothyroxine  75 mcg Oral QAC breakfast   memantine  10 mg Oral BID   metoprolol tartrate  25 mg Oral BID   pantoprazole  40 mg Oral Daily   polyethylene glycol  17 g Oral BID   sodium chloride flush  3 mL Intravenous Q12H   Continuous Infusions:  sodium chloride     PRN Meds: sodium chloride, acetaminophen, albuterol, bisacodyl, methocarbamol, ondansetron **OR** ondansetron (ZOFRAN) IV, oxyCODONE, senna-docusate, simethicone, sodium chloride flush   Vital Signs    Vitals:   01/28/23 1644 01/28/23 2156 01/29/23 0425 01/29/23 0816  BP: 123/67 (!) 147/57 (!) 146/73 (!) 142/68  Pulse: 78 84 77 93  Resp: 18 18 17 16   Temp: 98.5 F (36.9 C) 98.1 F (36.7 C) 97.8 F (36.6 C) (!) 97.5 F (36.4 C)  TempSrc:  Oral Oral   SpO2: 90% 96% 96% 92%  Weight:      Height:        Intake/Output Summary (Last 24 hours) at 01/29/2023 1038 Last data filed at 01/29/2023 0800 Gross per 24 hour  Intake 390.54 ml  Output 300 ml  Net 90.54 ml      01/26/2023    5:22 AM 01/14/2023   11:52 AM 12/18/2022    9:11 AM  Last 3 Weights  Weight (lbs) 138 lb 10.7 oz 132 lb 4.4 oz 132 lb 12.8 oz  Weight (kg) 62.9 kg 60 kg 60.238 kg      Telemetry    SR  80's - Personally Reviewed  ECG    SR 87bpm- Personally Reviewed  Physical Exam    GEN: No acute distress.   Neck: No JVD Cardiac: RRR, no murmurs, rubs, or gallops.  Respiratory: CTA b/l. GI: Soft, nontender,  non-distended  MS: No edema; No deformity. Neuro:  difficult to appreciate of her speech is different then the last time I saw her, (at which time she was in quite a bot of pain with her back, and wincing in pain with some intermittent pauses in speaking then)  She is moving all extremities Psych: Normal affect   PPM site is stable, no bleeding or hematoma  Labs    High Sensitivity Troponin:   Recent Labs  Lab 01/01/23 1029 01/01/23 1239 01/14/23 1317 01/25/23 2340 01/26/23 0120  TROPONINIHS 11 10 17 14 8      Chemistry Recent Labs  Lab 01/25/23 2340 01/26/23 0120 01/26/23 0542 01/27/23 0618 01/28/23 0632  NA 138  --  139 137 140  K 4.8  --  4.2 4.2 3.9  CL 104  --  108 104 108  CO2 22  --  22 22 19*  GLUCOSE 104*  --  101* 97 93  BUN 41*  --  36* 37* 26*  CREATININE 2.53*  --  2.14* 2.06* 1.34*  CALCIUM 8.9  --  8.7* 8.4* 8.2*  MG 2.6*  --   --   --   --  PROT 5.8*   < > 5.8* 5.6* 5.7*  ALBUMIN 3.5   < > 3.4* 3.3* 3.4*  AST 57*   < > 44* 46* 35  ALT 38   < > 40 47* 41  ALKPHOS 58   < > 55 57 55  BILITOT 0.6   < > 0.5 0.2* 0.6  GFRNONAA 18*  --  22* 23* 39*  ANIONGAP 12  --  9 11 13    < > = values in this interval not displayed.    Lipids No results for input(s): "CHOL", "TRIG", "HDL", "LABVLDL", "LDLCALC", "CHOLHDL" in the last 168 hours.  Hematology Recent Labs  Lab 01/26/23 0537 01/27/23 0618 01/28/23 0632  WBC 8.6 6.1 6.2  RBC 4.00  3.92 3.61* 4.04  HGB 11.5* 10.8* 11.7*  HCT 36.1 33.7* 36.5  MCV 90.3 93.4 90.3  MCH 28.8 29.9 29.0  MCHC 31.9 32.0 32.1  RDW 15.9* 15.9* 15.9*  PLT 245 213 253   Thyroid  Recent Labs  Lab 01/26/23 0120  TSH 1.610    BNPNo results for input(s): "BNP", "PROBNP" in the last 168 hours.  DDimer No results for input(s): "DDIMER" in the last 168 hours.   Radiology    NM Hepatobiliary Liver Func Result Date: 01/27/2023 CLINICAL DATA:  Cholelithiasis EXAM: NUCLEAR MEDICINE HEPATOBILIARY IMAGING TECHNIQUE: Sequential  images of the abdomen were obtained out to 60 minutes following intravenous administration of radiopharmaceutical. RADIOPHARMACEUTICALS:  5.3 mCi Tc-86m  Choletec IV COMPARISON:  Ultrasound and CT 01/26/2023 FINDINGS: Prompt uptake and biliary excretion of activity by the liver is seen. Gallbladder activity is visualized, consistent with patency of cystic duct. Biliary activity passes into small bowel, consistent with patent common bile duct. IMPRESSION: No common duct or cystic duct obstruction. Electronically Signed   By: Karen Kays M.D.   On: 01/27/2023 15:52    Cardiac Studies   01/27/23: TTE 1. Left ventricular ejection fraction, by estimation, is 60 to 65%. The  left ventricle has normal function. The left ventricle has no regional  wall motion abnormalities. Left ventricular diastolic parameters are  consistent with Grade II diastolic  dysfunction (pseudonormalization).   2. Right ventricular systolic function is normal. The right ventricular  size is normal. There is mildly elevated pulmonary artery systolic  pressure. The estimated right ventricular systolic pressure is 43.1 mmHg.   3. Left atrial size was moderately dilated.   4. The mitral valve is normal in structure. Mild mitral valve  regurgitation. No evidence of mitral stenosis.   5. The aortic valve is calcified. There is mild calcification of the  aortic valve. Aortic valve regurgitation is trivial. Aortic valve  sclerosis/calcification is present, without any evidence of aortic  stenosis.   6. The inferior vena cava is dilated in size with <50% respiratory  variability, suggesting right atrial pressure of 15 mmHg.   Comparison(s): No significant change from prior study.    NST: 10/2020 Lexiscan stress is electrically negative for ischemia Myoview scan shows normal perfusion No ischemia or scar LVEF calculated at 88% Low risk study   Echocardiogram: 02/2022 IMPRESSIONS   1. Left ventricular ejection fraction, by  estimation, is 60 to 65%. The  left ventricle has normal function. The left ventricle has no regional  wall motion abnormalities. There is mild left ventricular hypertrophy.  Left ventricular diastolic parameters  are consistent with Grade III diastolic dysfunction (restrictive).   2. Right ventricular systolic function is normal. The right ventricular  size is normal.   3.  Left atrial size was mildly dilated.   4. Mitral Valve Area (MVA) = 1.76 cm2. The mitral valve is degenerative.  Mild mitral valve regurgitation. Mild mitral stenosis. Moderate mitral  annular calcification.   5. The aortic valve was not assessed. Aortic valve regurgitation is not  visualized.   Comparison(s): Similar to prior; heart rates low in both studies.   Patient Profile     87 y.o. female with a hx of AFib, HFpEF, HTN, prior stroke, hypothyroidism, CKD (III) admitted with RUQ/abdominal pain, dizziness  Assessment & Plan    Bradycardia Tachy-brady Hx of very symptomatic rapid Afib, will need rate/rhythm management   S/p PPM implant yesterday  Site is stable Devic check this monring with good measurements CXR with stable lead positions, no PTX Wound care and activity restrictions were reviewed with the patient and placed in her AVS Usual post pacer EP follow up is in place       Paroxysmal AFib CHA2DS2Vasc is 6, home eliquis held   -Resume Eliquis in 5 days, on 02/03/23 >> pending CT/neuro  please reach out to [place] if felt needed sooner -Resume prior home lopressor 25mg  BID going home, start here when OK by IM team  -No flecainide going forward   Further management as per IM team   RUQ pain Back pain ? Speech changes, in d/w pt and RN, IM team aware, CT head done, pending read   Dr. Lalla Brothers has seen the patient this morning OK to discharge from EP perspective when ready medically otherwise We will sign off though remain available Please recall if needed    For questions or updates,  please contact [facility] Please consult [email or web address] for contact info under   Signed, Sheilah Pigeon, PA-C  01/29/2023, 10:38 AM

## 2023-01-29 NOTE — Care Management Important Message (Signed)
Important Message  Patient Details  Name: [name] MRN: [id number] Date of Birth: [birth year]   Medicare Important Message Given:  Yes     Johnwilliam Shepperson 01/29/2023, 2:50 PM

## 2023-01-29 NOTE — Plan of Care (Signed)
  Problem: Clinical Measurements: Goal: Respiratory complications will improve Outcome: Progressing Goal: Cardiovascular complication will be avoided Outcome: Progressing   Problem: Elimination: Goal: Will not experience complications related to bowel motility Outcome: Progressing Goal: Will not experience complications related to urinary retention Outcome: Progressing

## 2023-01-29 NOTE — Discharge Instructions (Signed)
After Your Pacemaker   You have a [employer]onic Pacemaker  ACTIVITY Do not lift your arm above shoulder height for 1 week after your procedure. After 7 days, you may progress as below.  You should remove your sling 24 hours after your procedure, unless otherwise instructed by your provider.     Thursday February 05, 2023  Friday February 06, 2023 Saturday February 07, 2023 Sunday February 08, 2023   Do not lift, push, pull, or carry anything over 10 pounds with the affected arm until 6 weeks (Thursday March 12, 2023 ) after your procedure.   You may drive AFTER your wound check, unless you have been told otherwise by your provider.   Ask your healthcare provider when you can go back to work   INCISION/Dressing If you are on a blood thinner such as Coumadin, Xarelto, Eliquis, Plavix, or Pradaxa please confirm with your provider when this should be resumed. Resume Eliquis 02/03/23  Monitor your Pacemaker site for redness, swelling, and drainage. Call the device clinic at [phone] if you experience these symptoms or fever/chills.  If your incision is sealed with Steri-strips or staples, you may shower 7 days after your procedure or when told by your provider. Do not remove the steri-strips or let the shower hit directly on your site. You may wash around your site with soap and water.    If you were discharged in a sling, please do not wear this during the day more than 48 hours after your surgery unless otherwise instructed. This may increase the risk of stiffness and soreness in your shoulder.   Avoid lotions, ointments, or perfumes over your incision until it is well-healed.  You may use a hot tub or a pool AFTER your wound check appointment if the incision is completely closed.  Pacemaker Alerts:  Some alerts are vibratory and others beep. These are NOT emergencies. Please call our office to let us know. If this occurs at night or on weekends, it can wait until the next business  day. Send a remote transmission.  If your device is capable of reading fluid status (for heart failure), you will be offered monthly monitoring to review this with you.   DEVICE MANAGEMENT Remote monitoring is used to monitor your pacemaker from home. This monitoring is scheduled every 91 days by our office. It allows [place] to keep an eye on the functioning of your device to ensure it is working properly. You will routinely see your Electrophysiologist annually (more often if necessary).   You should receive your ID card for your new device in 4-8 weeks. Keep this card with you at all times once received. Consider wearing a medical alert bracelet or necklace.  Your Pacemaker may be MRI compatible. This will be discussed at your next office visit/wound check.  You should avoid contact with strong electric or magnetic fields.   Do not use amateur (ham) radio equipment or electric (arc) welding torches. MP3 player headphones with magnets should not be used. Some devices are safe to use if held at least 12 inches (30 cm) from your Pacemaker. These include power tools, lawn mowers, and speakers. If you are unsure if something is safe to use, ask your health care provider.  When using your cell phone, hold it to the ear that is on the opposite side from the Pacemaker. Do not leave your cell phone in a pocket over the Pacemaker.  You may safely use electric blankets, heating pads, computers, and microwave ovens.  Call the office right away if: You have chest pain. You feel more short of breath than you have felt before. You feel more light-headed than you have felt before. Your incision starts to open up.  This information is not intended to replace advice given to you by your health care provider. Make sure you discuss any questions you have with your health care provider.

## 2023-01-29 NOTE — Progress Notes (Signed)
Physical Therapy Treatment Patient Details Name: [name] MRN: [id number] DOB: 12-13-1935 Today's Date: 01/29/2023   History of Present Illness 87 y.o. female presents to [facility] hospital on 01/25/2023 with abdominal pain. Pt found to have gall stones. She is now s/p Pacemaker placement on 01/28/23. PMH includes CKD IV, HTN, PAF, hypothyroidism, HLD, CVA.    PT Comments  Pt is progressing towards goals. Pt was able to increase gait distance this session but continues to require Min A and require a standing rest break after short distances. Pt is Min A for bed mobility and sit to stand for safety. Pt requires Max cueing in order to maintain pacemaker precautions. Due to pt current functional status, home set up and available assistance recommending skilled physical therapy services < 3 hours day, 5 days/week at a higher level of care and frequency on discharge from acute care hospitals setting in order to decrease risk for falls, injury, maintain pacemaker precautions and decrease risk for re-hospitalization.    If plan is discharge home, recommend the following: A little help with walking and/or transfers;Assistance with cooking/housework;Assist for transportation;Help with stairs or ramp for entrance   Can travel by private vehicle     Yes  Equipment Recommendations  None recommended by PT    Recommendations for Other Services       Precautions / Restrictions Precautions Precautions: Fall;ICD/Pacemaker Precaution Comments: Pacemaker, log roll for bed mobility Restrictions Weight Bearing Restrictions: No Other Position/Activity Restrictions: no push/pull/lift LUE     Mobility  Bed Mobility Overal bed mobility: Needs Assistance Bed Mobility: Rolling, Sidelying to Sit, Sit to Supine Rolling: Min assist Sidelying to sit: Min assist   Sit to supine: Min assist   General bed mobility comments: Pt prefers to log roll due to back pain    Transfers Overall transfer level: Needs  assistance Equipment used: Rolling walker (2 wheels) Transfers: Sit to/from Stand Sit to Stand: Min assist           General transfer comment: Light use of LUE on the RW    Ambulation/Gait Ambulation/Gait assistance: Min assist Gait Distance (Feet): 100 Feet Assistive device: Rolling walker (2 wheels) Gait Pattern/deviations: Step-to pattern, Ataxic Gait velocity: reduced Gait velocity interpretation: <1.8 ft/sec, indicate of risk for recurrent falls   General Gait Details: pt with slowed step-to gait, intermittent periods of RLE toe drag with ataxic extensor pattern, other times with smooth step-through pattern with RLE. Pt states this was better at home but after one of her strokes she had it much worse but it got better.       Balance Overall balance assessment: Needs assistance Sitting-balance support: No upper extremity supported, Feet supported Sitting balance-Leahy Scale: Good     Standing balance support: During functional activity, Bilateral upper extremity supported, Single extremity supported Standing balance-Leahy Scale: Poor Standing balance comment: reliant on ext support          Cognition Arousal: Alert Behavior During Therapy: WFL for tasks assessed/performed Overall Cognitive Status: No family/caregiver present to determine baseline cognitive functioning     Memory: Decreased recall of precautions                     General Comments General comments (skin integrity, edema, etc.): Pt has to be reminded frequently about pace maker precautions often times with tactile cues blocking pt from performing actions with the LUE      Pertinent Vitals/Pain Pain Assessment Pain Assessment: Faces Faces Pain Scale: Hurts whole lot  Pain Location: strong back and RUQ pain Pain Descriptors / Indicators: Aching, Discomfort, Grimacing, Guarding, Pressure Pain Intervention(s): Monitored during session, Limited activity within patient's tolerance,  Repositioned     PT Goals (current goals can now be found in the care plan section) Acute Rehab PT Goals Patient Stated Goal: to improve strength and activity tolerance PT Goal Formulation: With patient Time For Goal Achievement: 02/11/23 Potential to Achieve Goals: Fair Progress towards PT goals: Progressing toward goals    Frequency    Min 1X/week      PT Plan  Continue with current POC       AM-PAC PT "6 Clicks" Mobility   Outcome Measure  Help needed turning from your back to your side while in a flat bed without using bedrails?: A Little Help needed moving from lying on your back to sitting on the side of a flat bed without using bedrails?: A Little Help needed moving to and from a bed to a chair (including a wheelchair)?: A Little Help needed standing up from a chair using your arms (e.g., wheelchair or bedside chair)?: A Little Help needed to walk in hospital room?: A Little Help needed climbing 3-5 steps with a railing? : A Lot 6 Click Score: 17    End of Session Equipment Utilized During Treatment: Gait belt Activity Tolerance: Patient limited by fatigue;Patient limited by pain Patient left: in bed;with call bell/phone within reach;with bed alarm set Nurse Communication: Mobility status PT Visit Diagnosis: Other abnormalities of gait and mobility (R26.89);Muscle weakness (generalized) (M62.81);Other symptoms and signs involving the nervous system (R29.898)     Time: 9528-4132 PT Time Calculation (min) (ACUTE ONLY): 23 min  Charges:    $Gait Training: 8-22 mins $Therapeutic Activity: 8-22 mins PT General Charges $$ ACUTE PT VISIT: 1 Visit                    Harrel Carina, DPT, CLT  Acute Rehabilitation Services Office: [phone] (Secure chat preferred)    Claudia Desanctis 01/29/2023, 4:29 PM

## 2023-01-29 NOTE — TOC Initial Note (Signed)
Transition of Care [facility]) - Initial/Assessment Note    Patient Details  Name: [name] MRN: [id number] Date of Birth: [birth year]  Transition [facility] Care [facility]) CM/SW Contact:    Erin Sons, LCSW Phone Number: 01/29/2023, 1:06 PM  Clinical Narrative:                  CSW met with pt to discuss SNF recommendation. Pt lives alone in [place]. She is agreeable to SNF work up and prefers a facility [facility] as that is where her church community and brother are. Fl2 completed and bed requests sent in hub.   Expected Discharge Plan: Skilled Nursing Facility Barriers to Discharge: Continued Medical Work up   Patient Goals and CMS Choice    Rehab in [place]        Expected Discharge Plan and Services       Living arrangements for the past 2 months: Single Family Home                                      Prior Living Arrangements/Services Living arrangements for the past 2 months: Single Family Home Lives with:: Self Patient language and need for interpreter reviewed:: Yes        Need for Family Participation in Patient Care: No (Comment) Care giver support system in place?: Yes (comment)   Criminal Activity/Legal Involvement Pertinent to Current Situation/Hospitalization: No - Comment as needed  Activities of Daily Living Home Assistive Devices/Equipment: Cane (specify quad or straight) ADL Screening (condition at time of admission) Patient's cognitive ability adequate to safely complete daily activities?: Yes Is the patient deaf or have difficulty hearing?: No Does the patient have difficulty seeing, even when wearing glasses/contacts?: No Does the patient have difficulty concentrating, remembering, or making decisions?: No Patient able to express need for assistance with ADLs?: Yes Does the patient have difficulty dressing or bathing?: No Independently performs ADLs?: Yes (appropriate for developmental age) Does the patient have difficulty  walking or climbing stairs?: Yes Weakness of Legs: Both Weakness of Arms/Hands: Right  Permission Sought/Granted                  Emotional Assessment Appearance:: Appears stated age Attitude/Demeanor/Rapport: Engaged Affect (typically observed): Accepting Orientation: : Oriented to Self, Oriented to Place, Oriented to  Time, Oriented to Situation Alcohol / Substance Use: Not Applicable Psych Involvement: No (comment)  Admission diagnosis:  Acute cholecystitis [K81.0] AKI (acute kidney injury) (HCC) [N17.9] Abdominal pain, unspecified abdominal location [R10.9] Anemia, unspecified type [D64.9] Patient Active Problem List   Diagnosis Date Noted   Acute cholecystitis 01/26/2023   Acute anemia 01/26/2023   CKD (chronic kidney disease), stage IV (HCC) 01/26/2023   Acute kidney injury superimposed on CKD (HCC) 01/26/2023   Atrial fibrillation, chronic (HCC) 01/26/2023   Chronic back pain due to kyphosis 01/26/2023   Abdominal bloating 11/06/2022   Rectal bleeding 07/31/2022   Osteoporosis 01/07/2022   Pain of left hip joint 12/23/2021   Moderate persistent asthma 12/02/2021   Nasal congestion 10/22/2021   Pain in throat 10/22/2021   Expiratory wheezing 09/24/2021   Edema of lower extremity 06/03/2021   Hypercalcemia 06/03/2021   Pain in right foot 06/03/2021   Trochanteric bursitis of right hip 05/26/2021   History of seizure 05/07/2021   Memory impairment 05/07/2021   Moderate recurrent major depression (HCC) 05/02/2021   Paroxysmal atrial fibrillation (HCC) 02/01/2021  Muscle weakness 01/08/2021   Gastroesophageal reflux disease without esophagitis 10/24/2020   Hyperlipidemia 10/24/2020   Osteopenia 10/24/2020   Urge incontinence 12/09/2019   Mixed incontinence urge and stress (female)(female) 08/05/2019   Renal stones 08/05/2019   UPJ obstruction, 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   Hydronephrosis with ureteropelvic junction (UPJ) obstruction 08/05/2019   Postmenopausal  atrophic vaginitis 08/05/2019   Weak urinary stream 08/05/2019   Acquired thrombophilia (HCC) 06/16/2019   Lumbar spondylosis 02/15/2019   Paresthesia 12/17/2018   Chronic midline thoracic back pain 12/17/2018   Degeneration of lumbar intervertebral disc 12/15/2018   History of CVA (cerebrovascular accident) 08/25/2018   Depression, recurrent (HCC) 08/25/2018   T12 compression fracture, with delayed healing, subsequent encounter    Low back pain 08/19/2018   Constipation 08/19/2018   Chronic diastolic CHF (congestive heart failure) (HCC) 08/19/2018   Hypothyroidism 08/19/2018   Intractable pain 08/19/2018   Essential hypertension 08/19/2018   T12 compression fracture (HCC) 08/18/2018   Breast cancer (HCC) 06/23/2016   Change in bowel habits 01/11/2015   Loss of weight 01/11/2015   Abdominal pain, chronic, right upper quadrant 01/11/2015   Unspecified chronic bronchitis (HCC) 09/25/2013   Other dysphagia 04/01/2013   Cough 01/20/2013   Thrush 10/28/2012   Chest pain 09/26/2012   H/O adenomatous polyp of colon 11/18/2011   Chronic anticoagulation 11/18/2011   PCP:  Benita Stabile, MD Pharmacy:   [facility] [phone] - [place], [place] - [address] [place] [address] Phone: [phone] Fax: [phone]  [facility] - [address] [place], [place] - [address] [place] [address] Phone: [phone] Fax: [phone]  [facility] - [place], [place] - [address] [place] [address] Phone: [phone] Fax: [phone]  [facility] - [place], [place] - [address] [place] [address] Phone: [phone] Fax: [phone]     Social Determinants of Health (SDOH) Social History: SDOH Screenings   Food Insecurity: No Food Insecurity (01/26/2023)  Housing:  Patient Declined (01/26/2023)  Transportation Needs: No Transportation Needs (01/26/2023)  Utilities: Not At Risk (01/26/2023)  Alcohol Screen: Low Risk  (02/06/2022)  Depression (PHQ2-9): Low Risk  (02/06/2022)  Financial Resource Strain: Low Risk  (02/06/2022)  Physical Activity: Inactive (02/06/2022)  Social Connections: Moderately Integrated (02/06/2022)  Stress: No Stress Concern Present (02/06/2022)  Tobacco Use: Low Risk  (01/26/2023)   SDOH Interventions:     Readmission Risk Interventions     No data to display

## 2023-01-29 NOTE — [place] FL2 (Signed)
[place] MEDICAID FL2 LEVEL OF CARE FORM     IDENTIFICATION  Patient Name: [name] Birthdate: 02-Feb-1936 Sex: female Admission Date (Current Location): 01/25/2023  [facility] and [place] Number:  Producer, television/film/video and Address:  [facility], [address] [place], [place] [address]      Provider Number: [id number]  Attending Physician Name and Address:  Zigmund Daniel., *  Relative Name and Phone Number:       Current Level of Care: Hospital Recommended Level of Care: Skilled Nursing Facility Prior Approval Number:    Date Approved/Denied:   PASRR Number: [id number]  Discharge Plan: SNF    Current Diagnoses: Patient Active Problem List   Diagnosis Date Noted   Acute cholecystitis 01/26/2023   Acute anemia 01/26/2023   CKD (chronic kidney disease), stage IV (HCC) 01/26/2023   Acute kidney injury superimposed on CKD (HCC) 01/26/2023   Atrial fibrillation, chronic (HCC) 01/26/2023   Chronic back pain due to kyphosis 01/26/2023   Abdominal bloating 11/06/2022   Rectal bleeding 07/31/2022   Osteoporosis 01/07/2022   Pain of left hip joint 12/23/2021   Moderate persistent asthma 12/02/2021   Nasal congestion 10/22/2021   Pain in throat 10/22/2021   Expiratory wheezing 09/24/2021   Edema of lower extremity 06/03/2021   Hypercalcemia 06/03/2021   Pain in right foot 06/03/2021   Trochanteric bursitis of right hip 05/26/2021   History of seizure 05/07/2021   Memory impairment 05/07/2021   Moderate recurrent major depression (HCC) 05/02/2021   Paroxysmal atrial fibrillation (HCC) 02/01/2021   Muscle weakness 01/08/2021   Gastroesophageal reflux disease without esophagitis 10/24/2020   Hyperlipidemia 10/24/2020   Osteopenia 10/24/2020   Urge incontinence 12/09/2019   Mixed incontinence urge and stress (female)(female) 08/05/2019   Renal stones 08/05/2019   UPJ obstruction, 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   Hydronephrosis with  ureteropelvic junction (UPJ) obstruction 08/05/2019   Postmenopausal atrophic vaginitis 08/05/2019   Weak urinary stream 08/05/2019   Acquired thrombophilia (HCC) 06/16/2019   Lumbar spondylosis 02/15/2019   Paresthesia 12/17/2018   Chronic midline thoracic back pain 12/17/2018   Degeneration of lumbar intervertebral disc 12/15/2018   History of CVA (cerebrovascular accident) 08/25/2018   Depression, recurrent (HCC) 08/25/2018   T12 compression fracture, with delayed healing, subsequent encounter    Low back pain 08/19/2018   Constipation 08/19/2018   Chronic diastolic CHF (congestive heart failure) (HCC) 08/19/2018   Hypothyroidism 08/19/2018   Intractable pain 08/19/2018   Essential hypertension 08/19/2018   T12 compression fracture (HCC) 08/18/2018   Breast cancer (HCC) 06/23/2016   Change in bowel habits 01/11/2015   Loss of weight 01/11/2015   Abdominal pain, chronic, right upper quadrant 01/11/2015   Unspecified chronic bronchitis (HCC) 09/25/2013   Other dysphagia 04/01/2013   Cough 01/20/2013   Thrush 10/28/2012   Chest pain 09/26/2012   H/O adenomatous polyp of colon 11/18/2011   Chronic anticoagulation 11/18/2011    Orientation RESPIRATION BLADDER Height & Weight     Self, Time, Situation, Place  Normal Continent Weight: 138 lb 10.7 oz (62.9 kg) Height:  4\' 11"  (149.9 cm)  BEHAVIORAL SYMPTOMS/MOOD NEUROLOGICAL BOWEL NUTRITION STATUS      Continent Diet (see d/c summary)  AMBULATORY STATUS COMMUNICATION OF NEEDS Skin   Extensive Assist Verbally Normal                       Personal Care Assistance Level of Assistance  Feeding, Bathing, Dressing Bathing Assistance: Limited  assistance Feeding assistance: Independent Dressing Assistance: Limited assistance     Functional Limitations Info  Sight, Speech, Hearing Sight Info: Impaired Hearing Info: Impaired Speech Info: Adequate    SPECIAL CARE FACTORS FREQUENCY  PT (By licensed PT), OT (By licensed OT)      PT Frequency: 5x/week OT Frequency: 5x/week            Contractures Contractures Info: Not present    Additional Factors Info  Code Status, Allergies Code Status Info: full code Allergies Info: Epinephrine, demerol, morphine, Vicodin (hydrocodone-acetaminophen)           Current Medications (01/29/2023):  This is the current hospital active medication list Current Facility-Administered Medications  Medication Dose Route Frequency Provider Last Rate Last Admin   0.9 %  sodium chloride infusion  250 mL Intravenous PRN Marinus Maw, MD       acetaminophen (TYLENOL) tablet 325-650 mg  325-650 mg Oral Q4H PRN Calton Dach I, RPH       albuterol (PROVENTIL) (2.5 MG/3ML) 0.083% nebulizer solution 3 mL  3 mL Inhalation Q6H PRN Marinus Maw, MD   3 mL at 01/27/23 1627   amLODipine (NORVASC) tablet 10 mg  10 mg Oral Daily Marinus Maw, MD   10 mg at 01/29/23 2956   bisacodyl (DULCOLAX) suppository 10 mg  10 mg Rectal Daily PRN Marinus Maw, MD   10 mg at 01/29/23 0411   ceFAZolin (ANCEF) IVPB 1 g/50 mL premix  1 g Intravenous Q8H Marinus Maw, MD       ezetimibe (ZETIA) tablet 10 mg  10 mg Oral Daily Marinus Maw, MD   10 mg at 01/29/23 2130   ferrous gluconate (FERGON) tablet 324 mg  324 mg Oral Daily Marinus Maw, MD   324 mg at 01/28/23 1058   levothyroxine (SYNTHROID) tablet 75 mcg  75 mcg Oral QAC breakfast Marinus Maw, MD   75 mcg at 01/29/23 0714   memantine (NAMENDA) tablet 10 mg  10 mg Oral BID Marinus Maw, MD   10 mg at 01/29/23 0825   methocarbamol (ROBAXIN) tablet 500 mg  500 mg Oral Q6H PRN Marinus Maw, MD   500 mg at 01/29/23 0814   ondansetron (ZOFRAN) tablet 4 mg  4 mg Oral Q6H PRN Marinus Maw, MD       Or   ondansetron [facility]) injection 4 mg  4 mg Intravenous Q6H PRN Marinus Maw, MD       oxyCODONE (Oxy IR/ROXICODONE) immediate release tablet 5 mg  5 mg Oral Q6H PRN Marinus Maw, MD   5 mg at 01/29/23 0813    pantoprazole (PROTONIX) EC tablet 40 mg  40 mg Oral Daily Marinus Maw, MD   40 mg at 01/29/23 0814   polyethylene glycol (MIRALAX / GLYCOLAX) packet 17 g  17 g Oral BID Marinus Maw, MD   17 g at 01/29/23 8657   senna-docusate (Senokot-S) tablet 1 tablet  1 tablet Oral QHS PRN Marinus Maw, MD   1 tablet at 01/29/23 0248   simethicone (MYLICON) chewable tablet 80 mg  80 mg Oral QID PRN Gery Pray, MD   80 mg at 01/29/23 0244   sodium chloride flush (NS) 0.9 % injection 3 mL  3 mL Intravenous Q12H Marinus Maw, MD   3 mL at 01/28/23 1038   sodium chloride flush (NS) 0.9 % injection 3 mL  3 mL Intravenous PRN Ladona Ridgel,  Doylene Canning, MD         Discharge Medications: Please see discharge summary for a list of discharge medications.  Relevant Imaging Results:  Relevant Lab Results:   Additional Information SS# [id number]  [facility] [place], [place]

## 2023-01-29 NOTE — Progress Notes (Addendum)
PROGRESS NOTE    [name]  LKG:[id number] DOB: 14-Jul-1935 DOA: 01/25/2023 PCP: Benita Stabile, MD  Chief Complaint  Patient presents with   Abdominal Pain    Brief Narrative:   [name] is [name] 87 y.o. female with medical history CKD stage IV, significant of essential hypertension, paroxysmal atrial fibrillation on Eliquis 2.5 mg twice daily, hypothyroidism, hyperlipidemia, history of CVA/TIA, and chronic anemia presented to emergency department for evaluation for abdominal pain.  Initial suspicion for cholecystitis, but hida scan negative.  Concern for contribution from constipation?  Pending pacemaker placement then possible discharge pending symptoms.   Assessment & Plan:   Principal Problem:   Acute cholecystitis Active Problems:   Acute anemia   Hypothyroidism   History of CVA (cerebrovascular accident)   Hyperlipidemia   Memory impairment   CKD (chronic kidney disease), stage IV (HCC)   Acute kidney injury superimposed on CKD (HCC)   Atrial fibrillation, chronic (HCC)   Chronic back pain due to kyphosis  History Stroke  New Right Sided Ataxia Noted, eliquis on hold She refused MRI yesterday due to back pain -> head CT is without any acute intracranial abnormality Will repeat head CT 9/6 Notes symptoms present for [name] few days, unclear exactly when they started - abnormal FNF and heel to shin on R on exam Echo with EF 60-65%, no RWMA, grade II diastolic dysfunction Unfortunately with recent pacemaker placement, unable to get MRI at this time A1c, LDL PT/OT Needs neurology follow up outpatient (discussed informally today based on imaging findings) Eliquis on hold x5 days due to below  Tachybradycardia Syndrome Symptomatic Bradycardia History of Paroxysmal Atrial Fibrillation Appreciate cardiology assistance, planning for permanent pacemaker today Echo with EF 60-65%, no RWMA, grade II diastolic dysfunction Home eliquis currently on hold - resume  eliquis in 5 days (received 1 dose today) Flecainide is on hold  S/p PPM implant  Abdominal Pain CT abd/pelvis with pericholecystic fluid, RUQ [place] with small gallstone, 9 mm gallbladder wall polyp, mild gallbladder wall thickening HIDA without common duct or cystic duct obstruction Continued discomfort today, imaging without clear cause  She notes constipation recently, this maybe contributing to her discomfort Bowel regimen Consider GI follow up outpatient   Mild normocytic anemia: Hb trending 10-11 here.  Normal iron, b12, folate.  Trend outpatient.  AKI on CKD stage IIIb: Baseline creatinine around 1.3-1.9, she presented with 2.53 which is improved to 2.06.  Back to baseline today.  Chronic grade 3 diastolic heart failure: Appears to be euvolemic.  Holding Lasix and losartan in the setting of AKI -> can resume at discharge or in the next day or so.   Hypothyroidism, acquired: Continue Synthroid.   Hyperlipidemia: Continue ezetimibe.   Memory impairment: Continue memantine.  She is currently fully alert and oriented.   Chronic low back pain and right hip pain: Patient states that she lives with the pain.  Current pain seems to be at her baseline.   Essential hypertension: Stable.  Continue home dose of amlodipine but holding losartan and lasix.    DVT prophylaxis: SCD Code Status: full Family Communication: none Disposition:   Status is: Inpatient Remains inpatient appropriate because: none   Consultants:  Cardiology surgery  Procedures:  Echo IMPRESSIONS     1. Left ventricular ejection fraction, by estimation, is 60 to 65%. The  left ventricle has normal function. The left ventricle has no regional  wall motion abnormalities. Left ventricular diastolic parameters are  consistent with Grade II  diastolic  dysfunction (pseudonormalization).   2. Right ventricular systolic function is normal. The right ventricular  size is normal. There is mildly elevated pulmonary  artery systolic  pressure. The estimated right ventricular systolic pressure is 43.1 mmHg.   3. Left atrial size was moderately dilated.   4. The mitral valve is normal in structure. Mild mitral valve  regurgitation. No evidence of mitral stenosis.   5. The aortic valve is calcified. There is mild calcification of the  aortic valve. Aortic valve regurgitation is trivial. Aortic valve  sclerosis/calcification is present, without any evidence of aortic  stenosis.   6. The inferior vena cava is dilated in size with <50% respiratory  variability, suggesting right atrial pressure of 15 mmHg.   Comparison(s): No significant change from prior study.  Antimicrobials:  Anti-infectives (From admission, onward)    Start     Dose/Rate Route Frequency Ordered Stop   01/29/23 1400  ceFAZolin (ANCEF) IVPB 1 g/50 mL premix        1 g 100 mL/hr over 30 Minutes Intravenous Every 8 hours 01/29/23 1106 01/30/23 1359   01/28/23 0900  gentamicin (GARAMYCIN) 80 mg in sodium chloride 0.9 % 500 mL irrigation        80 mg Irrigation On call 01/27/23 1939 01/28/23 1222   01/28/23 0900  ceFAZolin (ANCEF) IVPB 2g/100 mL premix        2 g 200 mL/hr over 30 Minutes Intravenous On call 01/27/23 1939 01/28/23 1222   01/26/23 0400  cefTRIAXone (ROCEPHIN) 2 g in sodium chloride 0.9 % 100 mL IVPB  Status:  Discontinued        2 g 200 mL/hr over 30 Minutes Intravenous Every 24 hours 01/26/23 0348 01/27/23 1717   01/26/23 0400  metroNIDAZOLE (FLAGYL) IVPB 500 mg  Status:  Discontinued        500 mg 100 mL/hr over 60 Minutes Intravenous Every 12 hours 01/26/23 0348 01/27/23 0957       Subjective: No new complaints  Objective: Vitals:   01/28/23 1644 01/28/23 2156 01/29/23 0425 01/29/23 0816  BP: 123/67 (!) 147/57 (!) 146/73 (!) 142/68  Pulse: 78 84 77 93  Resp: 18 18 17 16   Temp: 98.5 F (36.9 C) 98.1 F (36.7 C) 97.8 F (36.6 C) (!) 97.5 F (36.4 C)  TempSrc:  Oral Oral   SpO2: 90% 96% 96% 92%  Weight:       Height:        Intake/Output Summary (Last 24 hours) at 01/29/2023 1407 Last data filed at 01/29/2023 0800 Gross per 24 hour  Intake 390.54 ml  Output 300 ml  Net 90.54 ml   Filed Weights   01/26/23 0522  Weight: 62.9 kg    Examination:  General: No acute distress. Cardiovascular: RRR Lungs: unlabored Abd: TTP to RUQ Neurological: abnormal FNF on R Extremities: No clubbing or cyanosis. No edema.  Data Reviewed: I have personally reviewed following labs and imaging studies  CBC: Recent Labs  Lab 01/25/23 2340 01/26/23 0537 01/27/23 0618 01/28/23 0632 01/29/23 0942  WBC 4.6 8.6 6.1 6.2 6.5  HGB 8.1* 11.5* 10.8* 11.7* 11.1*  HCT 26.2* 36.1 33.7* 36.5 34.9*  MCV 96.0 90.3 93.4 90.3 93.3  PLT 145* 245 213 253 230    Basic Metabolic Panel: Recent Labs  Lab 01/25/23 2340 01/26/23 0542 01/27/23 0618 01/28/23 0632 01/29/23 0942  NA 138 139 137 140 140  K 4.8 4.2 4.2 3.9 4.5  CL 104 108 104 108 109  CO2 22 22 22  19* 25  GLUCOSE 104* 101* 97 93 114*  BUN 41* 36* 37* 26* 15  CREATININE 2.53* 2.14* 2.06* 1.34* 1.24*  CALCIUM 8.9 8.7* 8.4* 8.2* 8.1*  MG 2.6*  --   --   --   --     GFR: Estimated Creatinine Clearance: 26.8 mL/min ([name]) (by C-G formula based on SCr of 1.24 mg/dL (H)).  Liver Function Tests: Recent Labs  Lab 01/26/23 0120 01/26/23 0542 01/27/23 0618 01/28/23 0632 01/29/23 0942  AST 44* 44* 46* 35 35  ALT 38 40 47* 41 30  ALKPHOS 56 55 57 55 53  BILITOT 0.7 0.5 0.2* 0.6 0.7  PROT 5.9* 5.8* 5.6* 5.7* 5.2*  ALBUMIN 3.4* 3.4* 3.3* 3.4* 3.1*    CBG: Recent Labs  Lab 01/28/23 2157  GLUCAP 89     Recent Results (from the past 240 hour(s))  Culture, blood (Routine X 2) w Reflex to ID Panel     Status: None (Preliminary result)   Collection Time: 01/26/23  5:35 AM   Specimen: BLOOD  Result Value Ref Range Status   Specimen Description BLOOD BLOOD RIGHT HAND  Final   Special Requests   Final    BOTTLES DRAWN AEROBIC AND ANAEROBIC  Blood Culture adequate volume   Culture   Final    NO GROWTH 3 DAYS Performed at [facility], [address] [place], [place] [address]    Report Status PENDING  Incomplete  Culture, blood (Routine X 2) w Reflex to ID Panel     Status: None (Preliminary result)   Collection Time: 01/26/23  5:37 AM   Specimen: BLOOD  Result Value Ref Range Status   Specimen Description BLOOD BLOOD LEFT HAND  Final   Special Requests   Final    BOTTLES DRAWN AEROBIC AND ANAEROBIC Blood Culture adequate volume   Culture   Final    NO GROWTH 3 DAYS Performed at [facility], [address], [place], [place] [address]    Report Status PENDING  Incomplete  Surgical PCR screen     Status: None   Collection Time: 01/27/23  7:39 PM   Specimen: Nasal Mucosa; Nasal Swab  Result Value Ref Range Status   MRSA, PCR NEGATIVE NEGATIVE Final   Staphylococcus aureus NEGATIVE NEGATIVE Final    Comment: (NOTE) The Xpert SA Assay (FDA approved for NASAL specimens in patients 93 years of age and older), is one component of Laron Angelini comprehensive surveillance program. It is not intended to diagnose infection nor to guide or monitor treatment. Performed at [facility], [address], [place], [place] [address]          Radiology Studies: CT HEAD WO CONTRAST ( )  Result Date: 01/29/2023 CLINICAL DATA:  Neuro deficit, acute, stroke suspected EXAM: CT HEAD WITHOUT CONTRAST TECHNIQUE: Contiguous axial images were obtained from the base of the skull through the vertex without intravenous contrast. RADIATION DOSE REDUCTION: This exam was performed according to the departmental dose-optimization program which includes automated exposure control, adjustment of the mA and/or kV according to patient size and/or use of iterative reconstruction technique. COMPARISON:  None Available. FINDINGS: Brain: No evidence of acute infarction, hemorrhage, hydrocephalus, extra-axial collection or mass lesion/mass effect.  Unchanged encephalomalacia in the inferomedial right frontal lobe. Unchanged calcified meningioma along the left frontal convexity. Chronic right cerebellar infarct. Vascular: No hyperdense vessel or unexpected calcification. Skull: Normal. Negative for fracture or focal lesion. Sinuses/Orbits: No middle ear or  mastoid effusion. Sinuses are notable for mucosal thickening of the floor of bilateral maxillary sinuses. Bilateral lens replacement. Orbits are otherwise unremarkable. Other: None. IMPRESSION: 1. No acute intracranial abnormality. 2. Unchanged encephalomalacia in the inferomedial right frontal lobe. 3. Unchanged calcified meningioma along the left frontal convexity. Electronically Signed   By: Lorenza Cambridge M.D.   On: 01/29/2023 13:17   DG Chest 2 View  Result Date: 01/29/2023 CLINICAL DATA:  Pacemaker placement. EXAM: CHEST - 2 VIEW COMPARISON:  January 25, 2023. FINDINGS: Stable cardiomediastinal silhouette. Left-sided pacemaker is noted with leads in grossly good position. No pneumothorax is noted. Right lung is clear. Small left pleural effusion is noted with associated left basilar atelectasis or infiltrate. Bony thorax is unremarkable. IMPRESSION: Left-sided pacemaker is noted in grossly good position. Small left pleural effusion is noted with associated left basilar atelectasis or infiltrate. Electronically Signed   By: Lupita Raider M.D.   On: 01/29/2023 07:59   EP PPM/ICD IMPLANT  Result Date: 01/28/2023 CONCLUSIONS:  1. Successful implantation of [name] Medtronic dual-chamber pacemaker for symptomatic bradycardia due to tachy-brady syndrome and 6 second pauses.  2. No early apparent complications.       Lewayne Bunting, MD 01/28/2023 1:23 PM   NM Hepatobiliary Liver Func  Result Date: 01/27/2023 CLINICAL DATA:  Cholelithiasis EXAM: NUCLEAR MEDICINE HEPATOBILIARY IMAGING TECHNIQUE: Sequential images of the abdomen were obtained out to 60 minutes following intravenous administration of  radiopharmaceutical. RADIOPHARMACEUTICALS:  5.3 mCi Tc-23m  Choletec IV COMPARISON:  Ultrasound and CT 01/26/2023 FINDINGS: Prompt uptake and biliary excretion of activity by the liver is seen. Gallbladder activity is visualized, consistent with patency of cystic duct. Biliary activity passes into small bowel, consistent with patent common bile duct. IMPRESSION: No common duct or cystic duct obstruction. Electronically Signed   By: Karen Kays M.D.   On: 01/27/2023 15:52        Scheduled Meds:  amLODipine  10 mg Oral Daily   apixaban  2.5 mg Oral BID   ezetimibe  10 mg Oral Daily   ferrous gluconate  324 mg Oral Daily   levothyroxine  75 mcg Oral QAC breakfast   memantine  10 mg Oral BID   pantoprazole  40 mg Oral Daily   polyethylene glycol  17 g Oral BID   sodium chloride flush  3 mL Intravenous Q12H   Continuous Infusions:  sodium chloride      ceFAZolin (ANCEF) IV 1 g (01/29/23 1353)     LOS: 3 days    Time spent: over 30 min    Lacretia Nicks, MD [facility]   To contact the attending provider between 7A-7P or the covering provider during after hours 7P-7A, please log into the web site [email or web address] and access using universal [place] password for that web site. If you do not have the password, please call the hospital operator.  01/29/2023, 2:07 PM

## 2023-01-30 ENCOUNTER — Inpatient Hospital Stay (HOSPITAL_COMMUNITY): Payer: PPO

## 2023-01-30 ENCOUNTER — Other Ambulatory Visit: Payer: Self-pay

## 2023-01-30 DIAGNOSIS — K810 Acute cholecystitis: Secondary | ICD-10-CM | POA: Diagnosis not present

## 2023-01-30 LAB — CBC
HCT: 33.7 % — ABNORMAL LOW (ref 36.0–46.0)
Hemoglobin: 11 g/dL — ABNORMAL LOW (ref 12.0–15.0)
MCH: 29.9 pg (ref 26.0–34.0)
MCHC: 32.6 g/dL (ref 30.0–36.0)
MCV: 91.6 fL (ref 80.0–100.0)
Platelets: 224 10*3/uL (ref 150–400)
RBC: 3.68 MIL/uL — ABNORMAL LOW (ref 3.87–5.11)
RDW: 16.3 % — ABNORMAL HIGH (ref 11.5–15.5)
WBC: 5.6 10*3/uL (ref 4.0–10.5)
nRBC: 0 % (ref 0.0–0.2)

## 2023-01-30 LAB — COMPREHENSIVE METABOLIC PANEL
ALT: 22 U/L (ref 0–44)
AST: 29 U/L (ref 15–41)
Albumin: 2.9 g/dL — ABNORMAL LOW (ref 3.5–5.0)
Alkaline Phosphatase: 48 U/L (ref 38–126)
Anion gap: 8 (ref 5–15)
BUN: 11 mg/dL (ref 8–23)
CO2: 25 mmol/L (ref 22–32)
Calcium: 8.1 mg/dL — ABNORMAL LOW (ref 8.9–10.3)
Chloride: 107 mmol/L (ref 98–111)
Creatinine, Ser: 1.07 mg/dL — ABNORMAL HIGH (ref 0.44–1.00)
GFR, Estimated: 51 mL/min — ABNORMAL LOW (ref >60)
Glucose, Bld: 83 mg/dL (ref 70–99)
Potassium: 3.9 mmol/L (ref 3.5–5.1)
Sodium: 140 mmol/L (ref 135–145)
Total Bilirubin: 0.6 mg/dL (ref 0.3–1.2)
Total Protein: 5.1 g/dL — ABNORMAL LOW (ref 6.5–8.1)

## 2023-01-30 LAB — LIPID PANEL
Cholesterol: 164 mg/dL (ref 0–200)
HDL: 72 mg/dL (ref >40)
LDL Cholesterol: 78 mg/dL (ref 0–99)
Total CHOL/HDL Ratio: 2.3 ratio
Triglycerides: 70 mg/dL (ref <150)
VLDL: 14 mg/dL (ref 0–40)

## 2023-01-30 LAB — HEMOGLOBIN A1C
Hgb A1c MFr Bld: 5.6 % (ref 4.8–5.6)
Mean Plasma Glucose: 114.02 mg/dL

## 2023-01-30 MED ORDER — DICLOFENAC SODIUM 1 % EX GEL
2.0000 g | Freq: Four times a day (QID) | CUTANEOUS | Status: DC
  Administered 2023-01-30 – 2023-02-03 (×15): 2 g via TOPICAL
  Filled 2023-01-30: qty 100

## 2023-01-30 NOTE — Progress Notes (Signed)
Physical Therapy Treatment Patient Details Name: [name] MRN: [id number] DOB: 27-Aug-1935 Today's Date: 01/30/2023   History of Present Illness 87 y.o. female presents to [facility] hospital on 01/25/2023 with abdominal pain. Pt found to have gall stones. She is now s/p Pacemaker placement on 01/28/23. PMH includes CKD IV, HTN, PAF, hypothyroidism, HLD, CVA.    PT Comments  Pt continues with slow progress towards goals. Pt continues at Min A for bed mobility and sit to stand/gait for safety. Pt requires Max cueing in order to maintain pacemaker precautions. Pt states she ambulates better stepping with her L foot first which did improve gait pattern and decrease R toe drag. Due to pt current functional status, home set up and available assistance recommending skilled physical therapy services < 3 hours day, 5 days/week at a higher level of care and frequency on discharge from acute care hospitals setting in order to decrease risk for falls, injury, maintain pacemaker precautions and decrease risk for re-hospitalization.     If plan is discharge home, recommend the following: A little help with walking and/or transfers;Assistance with cooking/housework;Assist for transportation;Help with stairs or ramp for entrance   Can travel by private vehicle     Yes  Equipment Recommendations  None recommended by PT       Precautions / Restrictions Precautions Precautions: Fall;ICD/Pacemaker Precaution Comments: Pacemaker, log roll for bed mobility Restrictions Weight Bearing Restrictions: No Other Position/Activity Restrictions: no push/pull/lift LUE     Mobility  Bed Mobility Overal bed mobility: Needs Assistance Bed Mobility: Rolling, Sidelying to Sit, Sit to Supine Rolling: Min assist Sidelying to sit: Min assist   Sit to supine: Min assist   General bed mobility comments: Pt prefers to log roll due to back pain, Assist with trunk for supine to sitting and LE for sitting to supine.     Transfers Overall transfer level: Needs assistance Equipment used: Rolling walker (2 wheels) Transfers: Sit to/from Stand Sit to Stand: Min assist           General transfer comment: Light use of LUE on the RW    Ambulation/Gait Ambulation/Gait assistance: Min assist Gait Distance (Feet): 75 Feet Assistive device: Rolling walker (2 wheels) Gait Pattern/deviations: Step-to pattern, Ataxic Gait velocity: reduced Gait velocity interpretation: <1.8 ft/sec, indicate of risk for recurrent falls   General Gait Details: pt with slowed step-to gait, intermittent periods of RLE toe drag with ataxic extensor pattern, other times with smooth step-through pattern with RLE. Pt performs better with L Foot stepping first followed by R.       Balance Overall balance assessment: Needs assistance Sitting-balance support: No upper extremity supported, Feet supported Sitting balance-Leahy Scale: Good     Standing balance support: During functional activity, Bilateral upper extremity supported, Single extremity supported Standing balance-Leahy Scale: Poor Standing balance comment: reliant on ext support          Cognition Arousal: Alert Behavior During Therapy: WFL for tasks assessed/performed Overall Cognitive Status: No family/caregiver present to determine baseline cognitive functioning         Memory: Decreased recall of precautions            General Comments General comments (skin integrity, edema, etc.): Continues to require frequent reminders of pacemaker precautions.      Pertinent Vitals/Pain Pain Assessment Pain Assessment: Faces Faces Pain Scale: Hurts whole lot Pain Location: strong back and RUQ pain Pain Descriptors / Indicators: Aching, Discomfort, Grimacing, Guarding, Pressure Pain Intervention(s): Monitored during session, Limited activity within  patient's tolerance, Premedicated before session     PT Goals (current goals can now be found in the care  plan section) Acute Rehab PT Goals Patient Stated Goal: to improve strength and activity tolerance PT Goal Formulation: With patient Time For Goal Achievement: 02/11/23 Potential to Achieve Goals: Fair Progress towards PT goals: Progressing toward goals    Frequency    Min 1X/week      PT Plan  Continue with current POC       AM-PAC PT "6 Clicks" Mobility   Outcome Measure  Help needed turning from your back to your side while in a flat bed without using bedrails?: A Little Help needed moving from lying on your back to sitting on the side of a flat bed without using bedrails?: A Little Help needed moving to and from a bed to a chair (including a wheelchair)?: A Little Help needed standing up from a chair using your arms (e.g., wheelchair or bedside chair)?: A Little Help needed to walk in hospital room?: A Little Help needed climbing 3-5 steps with a railing? : A Lot 6 Click Score: 17    End of Session Equipment Utilized During Treatment: Gait belt Activity Tolerance: Patient limited by fatigue;Patient limited by pain Patient left: in bed;with call bell/phone within reach;with bed alarm set Nurse Communication: Mobility status PT Visit Diagnosis: Other abnormalities of gait and mobility (R26.89);Muscle weakness (generalized) (M62.81);Other symptoms and signs involving the nervous system (R29.898)     Time: 7253-6644 PT Time Calculation (min) (ACUTE ONLY): 26 min  Charges:    $Gait Training: 8-22 mins $Therapeutic Activity: 8-22 mins PT General Charges $$ ACUTE PT VISIT: 1 Visit                    Harrel Carina, DPT, CLT  Acute Rehabilitation Services Office: [phone] (Secure chat preferred)    [name] 01/30/2023, 12:30 PM

## 2023-01-30 NOTE — Plan of Care (Signed)
  Problem: Education: Goal: Knowledge of General Education information will improve Description: Including pain rating scale, medication(s)/side effects and non-pharmacologic comfort measures Outcome: Progressing   Problem: Health Behavior/Discharge Planning: Goal: Ability to manage health-related needs will improve Outcome: Progressing   Problem: Clinical Measurements: Goal: Ability to maintain clinical measurements within normal limits will improve Outcome: Progressing Goal: Will remain free from infection Outcome: Progressing Goal: Diagnostic test results will improve Outcome: Progressing Goal: Respiratory complications will improve Outcome: Progressing Goal: Cardiovascular complication will be avoided Outcome: Progressing   Problem: Education: Goal: Knowledge of General Education information will improve Description: Including pain rating scale, medication(s)/side effects and non-pharmacologic comfort measures Outcome: Progressing   Problem: Health Behavior/Discharge Planning: Goal: Ability to manage health-related needs will improve Outcome: Progressing   Problem: Clinical Measurements: Goal: Ability to maintain clinical measurements within normal limits will improve Outcome: Progressing Goal: Will remain free from infection Outcome: Progressing Goal: Diagnostic test results will improve Outcome: Progressing Goal: Respiratory complications will improve Outcome: Progressing Goal: Cardiovascular complication will be avoided Outcome: Progressing

## 2023-01-30 NOTE — TOC Progression Note (Addendum)
Transition of Care [facility]) - Progression Note    Patient Details  Name: [name] MRN: [id number] Date of Birth: [birth year]  [facility] CM/SW Contact  Erin Sons, [place] Phone Number: 01/30/2023, 9:03 AM  Clinical Narrative:     CSW called pt and provided SNF bed offers. She chooses [facility]  CSW confirmed bed with [facility]   Auth started with [employer]; status pending  1540: Auth still pending for [facility]. They can admit tomorrow if Berkley Harvey is approved.  Expected Discharge Plan: Skilled Nursing Facility Barriers to Discharge: Insurance Authorization  Expected Discharge Plan and Services       Living arrangements for the past 2 months: Single Family Home                                       Social Determinants of Health (SDOH) Interventions SDOH Screenings   Food Insecurity: No Food Insecurity (01/26/2023)  Housing: Patient Declined (01/26/2023)  Transportation Needs: No Transportation Needs (01/26/2023)  Utilities: Not At Risk (01/26/2023)  Alcohol Screen: Low Risk  (02/06/2022)  Depression (PHQ2-9): Low Risk  (02/06/2022)  Financial Resource Strain: Low Risk  (02/06/2022)  Physical Activity: Inactive (02/06/2022)  Social Connections: Moderately Integrated (02/06/2022)  Stress: No Stress Concern Present (02/06/2022)  Tobacco Use: Low Risk  (01/26/2023)    Readmission Risk Interventions     No data to display

## 2023-01-30 NOTE — Progress Notes (Signed)
PROGRESS NOTE    [name]  VQQ:[id number] DOB: 06-04-1935 DOA: 01/25/2023 PCP: Cynthia Stabile, MD  Chief Complaint  Patient presents with   Abdominal Pain    Brief Narrative:   [name] is [name] 87 y.o. female with medical history CKD stage IV, significant of essential hypertension, paroxysmal atrial fibrillation on Eliquis 2.5 mg twice daily, hypothyroidism, hyperlipidemia, history of CVA/TIA, and chronic anemia presented to emergency department for evaluation for abdominal pain.  Initial suspicion for cholecystitis, but hida scan negative.  Concern for contribution from constipation?  Pending pacemaker placement then possible discharge pending symptoms.   Assessment & Plan:   Principal Problem:   Acute cholecystitis Active Problems:   Acute anemia   Hypothyroidism   History of CVA (cerebrovascular accident)   Hyperlipidemia   Memory impairment   CKD (chronic kidney disease), stage IV (HCC)   Acute kidney injury superimposed on CKD (HCC)   Atrial fibrillation, chronic (HCC)   Chronic back pain due to kyphosis  Right Breast Concern She notes concern for leaking of breast implant? Will follow CT chest  History Stroke  New Right Sided Ataxia Noted, eliquis on hold She refused MRI yesterday due to back pain -> head CT is without any acute intracranial abnormality Will repeat head CT 9/6 - stable Notes symptoms present for [place] few days, unclear exactly when they started - abnormal FNF and heel to shin on R on exam Echo with EF 60-65%, no RWMA, grade II diastolic dysfunction Unfortunately with recent pacemaker placement, unable to get MRI at this time A1c 5.6, LDL 78 PT/OT Needs neurology follow up outpatient  Eliquis on hold x5 days due to below, will resume once this hold is complete not clear why she's not on statin - will discuss  Tachybradycardia Syndrome Symptomatic Bradycardia History of Paroxysmal Atrial Fibrillation Appreciate cardiology  assistance, planning for permanent pacemaker today Echo with EF 60-65%, no RWMA, grade II diastolic dysfunction Home eliquis currently on hold - resume eliquis 9/10  Flecainide is on hold  S/p PPM implant  Abdominal Pain CT abd/pelvis with pericholecystic fluid, RUQ [place] with small gallstone, 9 mm gallbladder wall polyp, mild gallbladder wall thickening HIDA without common duct or cystic duct obstruction Continued discomfort today, imaging without clear cause (no visible rash or other findings on physical exam to explain discomfort) She notes constipation recently, this maybe contributing to her discomfort Bowel regimen Consider GI follow up outpatient   Mild normocytic anemia: Hb trending 10-11 here.  Normal iron, b12, folate.  Trend outpatient.  AKI on CKD stage IIIb: Baseline creatinine around 1.3-1.9, she presented with 2.53 which is improved to 2.06.  Back to baseline today.  Chronic grade 3 diastolic heart failure: Appears to be euvolemic.  Holding Lasix and losartan in the setting of AKI -> can resume at discharge or in the next day or so.   Hypothyroidism, acquired: Continue Synthroid.   Hyperlipidemia: Continue ezetimibe.   Memory impairment: Continue memantine.  She is currently fully alert and oriented.   Chronic low back pain and right hip pain: Patient states that she lives with the pain.  Current pain seems to be at her baseline.   Essential hypertension: Stable.  Continue home dose of amlodipine but holding losartan and lasix.    DVT prophylaxis: SCD Code Status: full Family Communication: none Disposition:   Status is: Inpatient Remains inpatient appropriate because: none   Consultants:  Cardiology surgery  Procedures:  Echo IMPRESSIONS     1.  Left ventricular ejection fraction, by estimation, is 60 to 65%. The  left ventricle has normal function. The left ventricle has no regional  wall motion abnormalities. Left ventricular diastolic parameters are   consistent with Grade II diastolic  dysfunction (pseudonormalization).   2. Right ventricular systolic function is normal. The right ventricular  size is normal. There is mildly elevated pulmonary artery systolic  pressure. The estimated right ventricular systolic pressure is 43.1 mmHg.   3. Left atrial size was moderately dilated.   4. The mitral valve is normal in structure. Mild mitral valve  regurgitation. No evidence of mitral stenosis.   5. The aortic valve is calcified. There is mild calcification of the  aortic valve. Aortic valve regurgitation is trivial. Aortic valve  sclerosis/calcification is present, without any evidence of aortic  stenosis.   6. The inferior vena cava is dilated in size with <50% respiratory  variability, suggesting right atrial pressure of 15 mmHg.   Comparison(s): No significant change from prior study.  Antimicrobials:  Anti-infectives (From admission, onward)    Start     Dose/Rate Route Frequency Ordered Stop   01/29/23 1400  ceFAZolin (ANCEF) IVPB 1 g/50 mL premix        1 g 100 mL/hr over 30 Minutes Intravenous Every 8 hours 01/29/23 1106 01/30/23 0553   01/28/23 0900  gentamicin (GARAMYCIN) 80 mg in sodium chloride 0.9 % 500 mL irrigation        80 mg Irrigation On call 01/27/23 1939 01/28/23 1222   01/28/23 0900  ceFAZolin (ANCEF) IVPB 2g/100 mL premix        2 g 200 mL/hr over 30 Minutes Intravenous On call 01/27/23 1939 01/28/23 1222   01/26/23 0400  cefTRIAXone (ROCEPHIN) 2 g in sodium chloride 0.9 % 100 mL IVPB  Status:  Discontinued        2 g 200 mL/hr over 30 Minutes Intravenous Every 24 hours 01/26/23 0348 01/27/23 1717   01/26/23 0400  metroNIDAZOLE (FLAGYL) IVPB 500 mg  Status:  Discontinued        500 mg 100 mL/hr over 60 Minutes Intravenous Every 12 hours 01/26/23 0348 01/27/23 0957       Subjective: Continued RUQ pain She told therapy her R breast was soft and was concern about her breast implant  Objective: Vitals:    01/29/23 2123 01/30/23 0555 01/30/23 0904 01/30/23 1725  BP: 137/64 (!) 153/69 (!) 134/56 137/63  Pulse: 83 79 83 88  Resp: 18 18 18 18   Temp: 98.2 F (36.8 C) 97.9 F (36.6 C) 98.7 F (37.1 C)   TempSrc: Oral Oral    SpO2: 98% 95% 94% 94%  Weight:  70.4 kg    Height:        Intake/Output Summary (Last 24 hours) at 01/30/2023 1740 Last data filed at 01/30/2023 0300 Gross per 24 hour  Intake 51.08 ml  Output --  Net 51.08 ml   Filed Weights   01/26/23 0522 01/30/23 0555  Weight: 62.9 kg 70.4 kg    Examination:  General: No acute distress. Cardiovascular: RRR Lungs: unlabored Abdomen: continued TTP to RUQ Neurological: Alert and oriented 3. Moves all extremities 4 with equal strength. Cranial nerves II through XII grossly intact. Extremities: No clubbing or cyanosis. No edema.   Data Reviewed: I have personally reviewed following labs and imaging studies  CBC: Recent Labs  Lab 01/26/23 0537 01/27/23 0618 01/28/23 0632 01/29/23 0942 01/30/23 0702  WBC 8.6 6.1 6.2 6.5 5.6  HGB 11.5*  10.8* 11.7* 11.1* 11.0*  HCT 36.1 33.7* 36.5 34.9* 33.7*  MCV 90.3 93.4 90.3 93.3 91.6  PLT 245 213 253 230 224    Basic Metabolic Panel: Recent Labs  Lab 01/25/23 2340 01/26/23 0542 01/27/23 0618 01/28/23 0632 01/29/23 0942 01/30/23 0702  NA 138 139 137 140 140 140  K 4.8 4.2 4.2 3.9 4.5 3.9  CL 104 108 104 108 109 107  CO2 22 22 22  19* 25 25  GLUCOSE 104* 101* 97 93 114* 83  BUN 41* 36* 37* 26* 15 11  CREATININE 2.53* 2.14* 2.06* 1.34* 1.24* 1.07*  CALCIUM 8.9 8.7* 8.4* 8.2* 8.1* 8.1*  MG 2.6*  --   --   --   --   --     GFR: Estimated Creatinine Clearance: 32.8 mL/min (Raianna Slight) (by C-G formula based on SCr of 1.07 mg/dL (H)).  Liver Function Tests: Recent Labs  Lab 01/26/23 0542 01/27/23 0618 01/28/23 1610 01/29/23 0942 01/30/23 0702  AST 44* 46* 35 35 29  ALT 40 47* 41 30 22  ALKPHOS 55 57 55 53 48  BILITOT 0.5 0.2* 0.6 0.7 0.6  PROT 5.8* 5.6* 5.7* 5.2* 5.1*   ALBUMIN 3.4* 3.3* 3.4* 3.1* 2.9*    CBG: Recent Labs  Lab 01/28/23 2157  GLUCAP 89     Recent Results (from the past 240 hour(s))  Culture, blood (Routine X 2) w Reflex to ID Panel     Status: None (Preliminary result)   Collection Time: 01/26/23  5:35 AM   Specimen: BLOOD  Result Value Ref Range Status   Specimen Description BLOOD BLOOD RIGHT HAND  Final   Special Requests   Final    BOTTLES DRAWN AEROBIC AND ANAEROBIC Blood Culture adequate volume   Culture   Final    NO GROWTH 4 DAYS Performed at [facility], [address] [place], [place] [address]    Report Status PENDING  Incomplete  Culture, blood (Routine X 2) w Reflex to ID Panel     Status: None (Preliminary result)   Collection Time: 01/26/23  5:37 AM   Specimen: BLOOD  Result Value Ref Range Status   Specimen Description BLOOD BLOOD LEFT HAND  Final   Special Requests   Final    BOTTLES DRAWN AEROBIC AND ANAEROBIC Blood Culture adequate volume   Culture   Final    NO GROWTH 4 DAYS Performed at [facility], [address] [place], [place] [address]    Report Status PENDING  Incomplete  Surgical PCR screen     Status: None   Collection Time: 01/27/23  7:39 PM   Specimen: Nasal Mucosa; Nasal Swab  Result Value Ref Range Status   MRSA, PCR NEGATIVE NEGATIVE Final   Staphylococcus aureus NEGATIVE NEGATIVE Final    Comment: (NOTE) The Xpert SA Assay (FDA approved for NASAL specimens in patients 52 years of age and older), is one component of Royalti Schauf comprehensive surveillance program. It is not intended to diagnose infection nor to guide or monitor treatment. Performed at [facility], [address], [place], [place] [address]          Radiology Studies: CT HEAD WO CONTRAST ( )  Result Date: 01/30/2023 CLINICAL DATA:  Neuro deficit, stroke suspected. EXAM: CT HEAD WITHOUT CONTRAST TECHNIQUE: Contiguous axial images were obtained from the base of the skull through the vertex without  intravenous contrast. RADIATION DOSE REDUCTION: This exam was performed according to the departmental dose-optimization program which  includes automated exposure control, adjustment of the mA and/or kV according to patient size and/or use of iterative reconstruction technique. COMPARISON:  CT head 1 day prior FINDINGS: Brain: There is no acute intracranial hemorrhage, extra-axial fluid collection. Background parenchymal volume is stable. The ventricles are stable in size. Encephalomalacia in the right anterior frontal and temporal lobes and with Hellon Vaccarella small remote infarct in the right cerebellar hemisphere are unchanged. The calcified left frontal meningioma is unchanged. The pituitary and suprasellar region are normal. There is no mass lesion. There is no mass effect or midline shift. Vascular: There is calcification of the bilateral carotid siphons. Skull: Normal. Negative for fracture or focal lesion. Sinuses/Orbits: The paranasal sinuses are clear. Bilateral lens implants are in place. The globes and orbits are otherwise unremarkable. Other: The mastoid air cells and middle ear cavities are clear. IMPRESSION: Stable noncontrast head CT with no acute intracranial pathology. Electronically Signed   By: Lesia Hausen M.D.   On: 01/30/2023 14:37   CT HEAD WO CONTRAST ( )  Result Date: 01/29/2023 CLINICAL DATA:  Neuro deficit, acute, stroke suspected EXAM: CT HEAD WITHOUT CONTRAST TECHNIQUE: Contiguous axial images were obtained from the base of the skull through the vertex without intravenous contrast. RADIATION DOSE REDUCTION: This exam was performed according to the departmental dose-optimization program which includes automated exposure control, adjustment of the mA and/or kV according to patient size and/or use of iterative reconstruction technique. COMPARISON:  None Available. FINDINGS: Brain: No evidence of acute infarction, hemorrhage, hydrocephalus, extra-axial collection or mass lesion/mass effect.  Unchanged encephalomalacia in the inferomedial right frontal lobe. Unchanged calcified meningioma along the left frontal convexity. Chronic right cerebellar infarct. Vascular: No hyperdense vessel or unexpected calcification. Skull: Normal. Negative for fracture or focal lesion. Sinuses/Orbits: No middle ear or mastoid effusion. Sinuses are notable for mucosal thickening of the floor of bilateral maxillary sinuses. Bilateral lens replacement. Orbits are otherwise unremarkable. Other: None. IMPRESSION: 1. No acute intracranial abnormality. 2. Unchanged encephalomalacia in the inferomedial right frontal lobe. 3. Unchanged calcified meningioma along the left frontal convexity. Electronically Signed   By: Lorenza Cambridge M.D.   On: 01/29/2023 13:17   DG Chest 2 View  Result Date: 01/29/2023 CLINICAL DATA:  Pacemaker placement. EXAM: CHEST - 2 VIEW COMPARISON:  January 25, 2023. FINDINGS: Stable cardiomediastinal silhouette. Left-sided pacemaker is noted with leads in grossly good position. No pneumothorax is noted. Right lung is clear. Small left pleural effusion is noted with associated left basilar atelectasis or infiltrate. Bony thorax is unremarkable. IMPRESSION: Left-sided pacemaker is noted in grossly good position. Small left pleural effusion is noted with associated left basilar atelectasis or infiltrate. Electronically Signed   By: Lupita Raider M.D.   On: 01/29/2023 07:59        Scheduled Meds:  amLODipine  10 mg Oral Daily   diclofenac Sodium  2 g Topical QID   ezetimibe  10 mg Oral Daily   ferrous gluconate  324 mg Oral Daily   levothyroxine  75 mcg Oral QAC breakfast   memantine  10 mg Oral BID   pantoprazole  40 mg Oral Daily   polyethylene glycol  17 g Oral BID   sodium chloride flush  3 mL Intravenous Q12H   Continuous Infusions:  sodium chloride       LOS: 4 days    Time spent: over 30 min    Lacretia Nicks, MD [facility]   To contact the attending provider  between 7A-7P or the covering provider  during after hours 7P-7A, please log into the web site [email or web address] and access using universal [place] password for that web site. If you do not have the password, please call the hospital operator.  01/30/2023, 5:40 PM

## 2023-01-31 DIAGNOSIS — K810 Acute cholecystitis: Secondary | ICD-10-CM | POA: Diagnosis not present

## 2023-01-31 LAB — CBC
HCT: 34.3 % — ABNORMAL LOW (ref 36.0–46.0)
Hemoglobin: 11.3 g/dL — ABNORMAL LOW (ref 12.0–15.0)
MCH: 29.8 pg (ref 26.0–34.0)
MCHC: 32.9 g/dL (ref 30.0–36.0)
MCV: 90.5 fL (ref 80.0–100.0)
Platelets: 218 10*3/uL (ref 150–400)
RBC: 3.79 MIL/uL — ABNORMAL LOW (ref 3.87–5.11)
RDW: 16.1 % — ABNORMAL HIGH (ref 11.5–15.5)
WBC: 6.3 10*3/uL (ref 4.0–10.5)
nRBC: 0 % (ref 0.0–0.2)

## 2023-01-31 LAB — CULTURE, BLOOD (ROUTINE X 2)
Culture: NO GROWTH
Culture: NO GROWTH
Special Requests: ADEQUATE
Special Requests: ADEQUATE

## 2023-01-31 LAB — COMPREHENSIVE METABOLIC PANEL
ALT: 22 U/L (ref 0–44)
AST: 36 U/L (ref 15–41)
Albumin: 3 g/dL — ABNORMAL LOW (ref 3.5–5.0)
Alkaline Phosphatase: 48 U/L (ref 38–126)
Anion gap: 6 (ref 5–15)
BUN: 12 mg/dL (ref 8–23)
CO2: 24 mmol/L (ref 22–32)
Calcium: 8.3 mg/dL — ABNORMAL LOW (ref 8.9–10.3)
Chloride: 108 mmol/L (ref 98–111)
Creatinine, Ser: 0.96 mg/dL (ref 0.44–1.00)
GFR, Estimated: 58 mL/min — ABNORMAL LOW (ref >60)
Glucose, Bld: 98 mg/dL (ref 70–99)
Potassium: 3.8 mmol/L (ref 3.5–5.1)
Sodium: 138 mmol/L (ref 135–145)
Total Bilirubin: 0.5 mg/dL (ref 0.3–1.2)
Total Protein: 5.4 g/dL — ABNORMAL LOW (ref 6.5–8.1)

## 2023-01-31 MED ORDER — VALACYCLOVIR HCL 500 MG PO TABS
1000.0000 mg | ORAL_TABLET | Freq: Two times a day (BID) | ORAL | Status: DC
  Administered 2023-01-31 – 2023-02-01 (×3): 1000 mg via ORAL
  Filled 2023-01-31 (×3): qty 2

## 2023-01-31 MED ORDER — GABAPENTIN 100 MG PO CAPS
100.0000 mg | ORAL_CAPSULE | Freq: Three times a day (TID) | ORAL | Status: DC
  Administered 2023-01-31 – 2023-02-03 (×9): 100 mg via ORAL
  Filled 2023-01-31 (×9): qty 1

## 2023-01-31 NOTE — Progress Notes (Signed)
PROGRESS NOTE    [name]  BMW:[id number] DOB: January 12, 1936 DOA: 01/25/2023 PCP: Benita Stabile, MD  Chief Complaint  Patient presents with   Abdominal Pain    Brief Narrative:   [name] is [name] 87 y.o. female with medical history CKD stage IV, significant of essential hypertension, paroxysmal atrial fibrillation on Eliquis 2.5 mg twice daily, hypothyroidism, hyperlipidemia, history of CVA/TIA, and chronic anemia presented to emergency department for evaluation for abdominal pain.  Initial suspicion for cholecystitis, but hida scan negative.  Concern for contribution from constipation?  Pending pacemaker placement then possible discharge pending symptoms.   Assessment & Plan:   Principal Problem:   Acute cholecystitis Active Problems:   Acute anemia   Hypothyroidism   History of CVA (cerebrovascular accident)   Hyperlipidemia   Memory impairment   CKD (chronic kidney disease), stage IV (HCC)   Acute kidney injury superimposed on CKD (HCC)   Atrial fibrillation, chronic (HCC)   Chronic back pain due to kyphosis  Rash  Right Upper Quadrant Pain  Right Flank Pain CT abd/pelvis with pericholecystic fluid, RUQ [place] with small gallstone, 9 mm gallbladder wall polyp, mild gallbladder wall thickening HIDA without common duct or cystic duct obstruction She continues to have pain which seems more impressive than would be expected based on her exam.  No clear cause.  She does have [name] mild rash on her R flank and similar tenderness to her back - pain possibly in [name] dermatomal pattern.  Could shingles explain this?  Mild rash is present, but not vesicular, not classic for shingles.  I think given her persistent symptoms and rash, will empirically treat and follow rash.   Continued discomfort today, imaging without clear cause (no visible rash or other findings on physical exam to explain discomfort) She notes constipation recently, this maybe contributing to her discomfort Bowel  regimen Consider GI follow up outpatient   Right Breast Concern She notes concern for leaking of breast implant? CT chest with linear folds vs rupture of R breast implant capsule Will need to follow up with plastics (none on call today, will call Monday)  History Stroke  New Right Sided Ataxia Noted, eliquis on hold She refused MRI due to back pain -> head CT is without any acute intracranial abnormality Will repeat head CT 9/6 - stable Notes symptoms present for [name] few days, unclear exactly when they started - abnormal FNF and heel to shin on R on exam Echo with EF 60-65%, no RWMA, grade II diastolic dysfunction Unfortunately with recent pacemaker placement, unable to get MRI at this time A1c 5.6, LDL 78 PT/OT Needs neurology follow up outpatient  Eliquis on hold x5 days due to below, will resume once this hold is complete (9/10) not clear why she's not on statin - will discuss  Tachybradycardia Syndrome Symptomatic Bradycardia History of Paroxysmal Atrial Fibrillation Appreciate cardiology assistance, planning for permanent pacemaker today Echo with EF 60-65%, no RWMA, grade II diastolic dysfunction Home eliquis currently on hold - resume eliquis 9/10  Flecainide is on hold  S/p PPM implant  Mild normocytic anemia: Hb trending 10-11 here.  Normal iron, b12, folate.  Trend outpatient.  AKI on CKD stage IIIb: Baseline creatinine around 1.3-1.9, she presented with 2.53 which is improved to 2.06.  Back to baseline today.  Chronic grade 3 diastolic heart failure: Appears to be euvolemic.  Holding Lasix and losartan in the setting of AKI -> can resume at discharge or in the next day  or so.   Hypothyroidism, acquired: Continue Synthroid.   Hyperlipidemia: Continue ezetimibe.   Memory impairment: Continue memantine.  She is currently fully alert and oriented.   Chronic low back pain and right hip pain: Patient states that she lives with the pain.  Current pain seems to be at her  baseline.   Essential hypertension: Stable.  Continue home dose of amlodipine but holding losartan and lasix.    DVT prophylaxis: SCD Code Status: full Family Communication: none Disposition:   Status is: Inpatient Remains inpatient appropriate because: none   Consultants:  Cardiology surgery  Procedures:  Echo IMPRESSIONS     1. Left ventricular ejection fraction, by estimation, is 60 to 65%. The  left ventricle has normal function. The left ventricle has no regional  wall motion abnormalities. Left ventricular diastolic parameters are  consistent with Grade II diastolic  dysfunction (pseudonormalization).   2. Right ventricular systolic function is normal. The right ventricular  size is normal. There is mildly elevated pulmonary artery systolic  pressure. The estimated right ventricular systolic pressure is 43.1 mmHg.   3. Left atrial size was moderately dilated.   4. The mitral valve is normal in structure. Mild mitral valve  regurgitation. No evidence of mitral stenosis.   5. The aortic valve is calcified. There is mild calcification of the  aortic valve. Aortic valve regurgitation is trivial. Aortic valve  sclerosis/calcification is present, without any evidence of aortic  stenosis.   6. The inferior vena cava is dilated in size with <50% respiratory  variability, suggesting right atrial pressure of 15 mmHg.   Comparison(s): No significant change from prior study.  Antimicrobials:  Anti-infectives (From admission, onward)    Start     Dose/Rate Route Frequency Ordered Stop   01/29/23 1400  ceFAZolin (ANCEF) IVPB 1 g/50 mL premix        1 g 100 mL/hr over 30 Minutes Intravenous Every 8 hours 01/29/23 1106 01/30/23 0553   01/28/23 0900  gentamicin (GARAMYCIN) 80 mg in sodium chloride 0.9 % 500 mL irrigation        80 mg Irrigation On call 01/27/23 1939 01/28/23 1222   01/28/23 0900  ceFAZolin (ANCEF) IVPB 2g/100 mL premix        2 g 200 mL/hr over 30 Minutes  Intravenous On call 01/27/23 1939 01/28/23 1222   01/26/23 0400  cefTRIAXone (ROCEPHIN) 2 g in sodium chloride 0.9 % 100 mL IVPB  Status:  Discontinued        2 g 200 mL/hr over 30 Minutes Intravenous Every 24 hours 01/26/23 0348 01/27/23 1717   01/26/23 0400  metroNIDAZOLE (FLAGYL) IVPB 500 mg  Status:  Discontinued        500 mg 100 mL/hr over 60 Minutes Intravenous Every 12 hours 01/26/23 0348 01/27/23 0957       Subjective: Continues to describe R flank pain and RUQ pain  Objective: Vitals:   01/30/23 1725 01/30/23 2019 01/31/23 0558 01/31/23 0852  BP: 137/63 (!) 152/71 (!) 151/75 130/65  Pulse: 88 96 71 83  Resp: 18 12 16 16   Temp:  98.2 F (36.8 C) 98.2 F (36.8 C) 98 F (36.7 C)  TempSrc:   Oral Oral  SpO2: 94% 97% 96% 97%  Weight:      Height:        Intake/Output Summary (Last 24 hours) at 01/31/2023 1533 Last data filed at 01/31/2023 0830 Gross per 24 hour  Intake 118 ml  Output 0 ml  Net 118  ml   Filed Weights   01/26/23 0522 01/30/23 0555  Weight: 62.9 kg 70.4 kg    Examination:  General: No acute distress. Cardiovascular: RRR Lungs: unlabored Abdomen: Soft, nontender, nondistended  Neurological: Alert and oriented 3. Moves all extremities 4 with equal strength. Cranial nerves II through XII grossly intact. Skin: mild rash at R flank, no obvious vesicular lesion Extremities: No clubbing or cyanosis. No edema.   Data Reviewed: I have personally reviewed following labs and imaging studies  CBC: Recent Labs  Lab 01/27/23 0618 01/28/23 0632 01/29/23 0942 01/30/23 0702 01/31/23 1137  WBC 6.1 6.2 6.5 5.6 6.3  HGB 10.8* 11.7* 11.1* 11.0* 11.3*  HCT 33.7* 36.5 34.9* 33.7* 34.3*  MCV 93.4 90.3 93.3 91.6 90.5  PLT 213 253 230 224 218    Basic Metabolic Panel: Recent Labs  Lab 01/25/23 2340 01/26/23 0542 01/27/23 0618 01/28/23 0632 01/29/23 0942 01/30/23 0702 01/31/23 1137  NA 138   < > 137 140 140 140 138  K 4.8   < > 4.2 3.9 4.5 3.9 3.8   CL 104   < > 104 108 109 107 108  CO2 22   < > 22 19* 25 25 24   GLUCOSE 104*   < > 97 93 114* 83 98  BUN 41*   < > 37* 26* 15 11 12   CREATININE 2.53*   < > 2.06* 1.34* 1.24* 1.07* 0.96  CALCIUM 8.9   < > 8.4* 8.2* 8.1* 8.1* 8.3*  MG 2.6*  --   --   --   --   --   --    < > = values in this interval not displayed.    GFR: Estimated Creatinine Clearance: 36.6 mL/min (by C-G formula based on SCr of 0.96 mg/dL).  Liver Function Tests: Recent Labs  Lab 01/27/23 0618 01/28/23 1610 01/29/23 0942 01/30/23 0702 01/31/23 1137  AST 46* 35 35 29 36  ALT 47* 41 30 22 22   ALKPHOS 57 55 53 48 48  BILITOT 0.2* 0.6 0.7 0.6 0.5  PROT 5.6* 5.7* 5.2* 5.1* 5.4*  ALBUMIN 3.3* 3.4* 3.1* 2.9* 3.0*    CBG: Recent Labs  Lab 01/28/23 2157  GLUCAP 89     Recent Results (from the past 240 hour(s))  Culture, blood (Routine X 2) w Reflex to ID Panel     Status: None   Collection Time: 01/26/23  5:35 AM   Specimen: BLOOD  Result Value Ref Range Status   Specimen Description BLOOD BLOOD RIGHT HAND  Final   Special Requests   Final    BOTTLES DRAWN AEROBIC AND ANAEROBIC Blood Culture adequate volume   Culture   Final    NO GROWTH 5 DAYS Performed at [facility], [address], [place], [place] [address]    Report Status 01/31/2023 FINAL  Final  Culture, blood (Routine X 2) w Reflex to ID Panel     Status: None   Collection Time: 01/26/23  5:37 AM   Specimen: BLOOD  Result Value Ref Range Status   Specimen Description BLOOD BLOOD LEFT HAND  Final   Special Requests   Final    BOTTLES DRAWN AEROBIC AND ANAEROBIC Blood Culture adequate volume   Culture   Final    NO GROWTH 5 DAYS Performed at [facility], [address] [place], [place] [address]    Report Status 01/31/2023 FINAL  Final  Surgical PCR screen     Status: None  Collection Time: 01/27/23  7:39 PM   Specimen: Nasal Mucosa; Nasal Swab  Result Value Ref Range Status   MRSA, PCR NEGATIVE NEGATIVE Final    Staphylococcus aureus NEGATIVE NEGATIVE Final    Comment: (NOTE) The Xpert SA Assay (FDA approved for NASAL specimens in patients 33 years of age and older), is one component of Humberto Addo comprehensive surveillance program. It is not intended to diagnose infection nor to guide or monitor treatment. Performed at [facility], [address] [place], [place] [address]          Radiology Studies: CT CHEST WO CONTRAST  Result Date: 01/30/2023 CLINICAL DATA:  Right chest wall/breast pain. EXAM: CT CHEST WITHOUT CONTRAST TECHNIQUE: Multidetector CT imaging of the chest was performed following the standard protocol without IV contrast. RADIATION DOSE REDUCTION: This exam was performed according to the departmental dose-optimization program which includes automated exposure control, adjustment of the mA and/or kV according to patient size and/or use of iterative reconstruction technique. COMPARISON:  CT of the chest abdomen pelvis dated 01/01/2023. FINDINGS: Evaluation of this exam is limited in the absence of intravenous contrast. Cardiovascular: There is no cardiomegaly or pericardial effusion. Left pectoral pacemaker device. Coronary vascular calcification noted. Mild atherosclerotic calcification of the thoracic aorta. No aneurysmal dilatation. The central pulmonary arteries are grossly unremarkable on this noncontrast CT. Mediastinum/Nodes: No hilar or mediastinal adenopathy. Evaluation however is limited in the absence of intravenous contrast. The esophagus is grossly unremarkable. No mediastinal fluid collection. Lungs/Pleura: Small bilateral pleural effusions, new since the prior CT. Large areas of consolidation with air bronchogram involving the lung bases bilaterally may represent atelectasis or pneumonia. Clinical correlation is recommended. There is no pneumothorax. The central airways are patent. Upper Abdomen: No acute abnormality. Musculoskeletal: Bilateral breast implants. There is  calcification of the left breast implant capsule. Linear folds versus rupture of the right breast implant capsule. There is osteopenia with degenerative changes of the spine. Old T12 compression fracture and vertebroplasty. No acute osseous pathology. Small pockets of air in the left anterior chest wall related to pacemaker placement. No fluid collection or hematoma. Mild thickening of the skin of the left breast. Clinical correlation is recommended. IMPRESSION: 1. Small bilateral pleural effusions bibasilar atelectasis or pneumonia. 2. Bilateral breast implants. Linear folds versus rupture of the right breast implant capsule. 3.  Aortic Atherosclerosis (ICD10-I70.0). Electronically Signed   By: Elgie Collard M.D.   On: 01/30/2023 19:50   CT HEAD WO CONTRAST ( )  Result Date: 01/30/2023 CLINICAL DATA:  Neuro deficit, stroke suspected. EXAM: CT HEAD WITHOUT CONTRAST TECHNIQUE: Contiguous axial images were obtained from the base of the skull through the vertex without intravenous contrast. RADIATION DOSE REDUCTION: This exam was performed according to the departmental dose-optimization program which includes automated exposure control, adjustment of the mA and/or kV according to patient size and/or use of iterative reconstruction technique. COMPARISON:  CT head 1 day prior FINDINGS: Brain: There is no acute intracranial hemorrhage, extra-axial fluid collection. Background parenchymal volume is stable. The ventricles are stable in size. Encephalomalacia in the right anterior frontal and temporal lobes and with Robyn Nohr small remote infarct in the right cerebellar hemisphere are unchanged. The calcified left frontal meningioma is unchanged. The pituitary and suprasellar region are normal. There is no mass lesion. There is no mass effect or midline shift. Vascular: There is calcification of the bilateral carotid siphons. Skull: Normal. Negative for fracture or focal lesion. Sinuses/Orbits: The paranasal sinuses are  clear. Bilateral lens implants are  in place. The globes and orbits are otherwise unremarkable. Other: The mastoid air cells and middle ear cavities are clear. IMPRESSION: Stable noncontrast head CT with no acute intracranial pathology. Electronically Signed   By: Lesia Hausen M.D.   On: 01/30/2023 14:37        Scheduled Meds:  amLODipine  10 mg Oral Daily   diclofenac Sodium  2 g Topical QID   ezetimibe  10 mg Oral Daily   ferrous gluconate  324 mg Oral Daily   levothyroxine  75 mcg Oral QAC breakfast   memantine  10 mg Oral BID   pantoprazole  40 mg Oral Daily   polyethylene glycol  17 g Oral BID   sodium chloride flush  3 mL Intravenous Q12H   Continuous Infusions:  sodium chloride       LOS: 5 days    Time spent: over 30 min    Lacretia Nicks, MD [facility]   To contact the attending provider between 7A-7P or the covering provider during after hours 7P-7A, please log into the web site [email or web address] and access using universal [place] password for that web site. If you do not have the password, please call the hospital operator.  01/31/2023, 3:33 PM

## 2023-01-31 NOTE — Plan of Care (Signed)
  Problem: Education: Goal: Knowledge of General Education information will improve Description: Including pain rating scale, medication(s)/side effects and non-pharmacologic comfort measures Outcome: Adequate for Discharge   Problem: Health Behavior/Discharge Planning: Goal: Ability to manage health-related needs will improve Outcome: Adequate for Discharge   Problem: Clinical Measurements: Goal: Ability to maintain clinical measurements within normal limits will improve Outcome: Adequate for Discharge Goal: Will remain free from infection Outcome: Adequate for Discharge Goal: Diagnostic test results will improve Outcome: Adequate for Discharge Goal: Respiratory complications will improve Outcome: Adequate for Discharge Goal: Cardiovascular complication will be avoided Outcome: Adequate for Discharge   Problem: Activity: Goal: Risk for activity intolerance will decrease Outcome: Adequate for Discharge   Problem: Nutrition: Goal: Adequate nutrition will be maintained Outcome: Adequate for Discharge   Problem: Coping: Goal: Level of anxiety will decrease Outcome: Adequate for Discharge   Problem: Elimination: Goal: Will not experience complications related to bowel motility Outcome: Adequate for Discharge Goal: Will not experience complications related to urinary retention Outcome: Adequate for Discharge   Problem: Pain Managment: Goal: General experience of comfort will improve Outcome: Adequate for Discharge   Problem: Safety: Goal: Ability to remain free from injury will improve Outcome: Adequate for Discharge   Problem: Skin Integrity: Goal: Risk for impaired skin integrity will decrease Outcome: Adequate for Discharge   Problem: Education: Goal: Knowledge of cardiac device and self-care will improve Outcome: Adequate for Discharge Goal: Ability to safely manage health related needs after discharge will improve Outcome: Adequate for Discharge Goal:  Individualized Educational Video(s) Outcome: Adequate for Discharge   Problem: Cardiac: Goal: Ability to achieve and maintain adequate cardiopulmonary perfusion will improve Outcome: Adequate for Discharge   Problem: Education: Goal: Knowledge of General Education information will improve Description: Including pain rating scale, medication(s)/side effects and non-pharmacologic comfort measures Outcome: Adequate for Discharge   Problem: Health Behavior/Discharge Planning: Goal: Ability to manage health-related needs will improve Outcome: Adequate for Discharge   Problem: Clinical Measurements: Goal: Ability to maintain clinical measurements within normal limits will improve Outcome: Adequate for Discharge Goal: Will remain free from infection Outcome: Adequate for Discharge Goal: Diagnostic test results will improve Outcome: Adequate for Discharge Goal: Respiratory complications will improve Outcome: Adequate for Discharge Goal: Cardiovascular complication will be avoided Outcome: Adequate for Discharge   Problem: Activity: Goal: Risk for activity intolerance will decrease Outcome: Adequate for Discharge   Problem: Nutrition: Goal: Adequate nutrition will be maintained Outcome: Adequate for Discharge   Problem: Coping: Goal: Level of anxiety will decrease Outcome: Adequate for Discharge   Problem: Elimination: Goal: Will not experience complications related to bowel motility Outcome: Adequate for Discharge Goal: Will not experience complications related to urinary retention Outcome: Adequate for Discharge   Problem: Pain Managment: Goal: General experience of comfort will improve Outcome: Adequate for Discharge   Problem: Safety: Goal: Ability to remain free from injury will improve Outcome: Adequate for Discharge   Problem: Skin Integrity: Goal: Risk for impaired skin integrity will decrease Outcome: Adequate for Discharge   Problem: Education: Goal:  Knowledge of cardiac device and self-care will improve Outcome: Adequate for Discharge Goal: Ability to safely manage health related needs after discharge will improve Outcome: Adequate for Discharge Goal: Individualized Educational Video(s) Outcome: Adequate for Discharge   Problem: Cardiac: Goal: Ability to achieve and maintain adequate cardiopulmonary perfusion will improve Outcome: Adequate for Discharge

## 2023-01-31 NOTE — TOC Progression Note (Signed)
Call received from [facility] with [employer] who reports pt has been approved for SNF/[facility], [phone].   MD updated.   Dellie Burns, MSW, LCSW [phone] (coverage)

## 2023-02-01 DIAGNOSIS — K810 Acute cholecystitis: Secondary | ICD-10-CM | POA: Diagnosis not present

## 2023-02-01 LAB — COMPREHENSIVE METABOLIC PANEL
ALT: 25 U/L (ref 0–44)
AST: 41 U/L (ref 15–41)
Albumin: 2.8 g/dL — ABNORMAL LOW (ref 3.5–5.0)
Alkaline Phosphatase: 44 U/L (ref 38–126)
Anion gap: 7 (ref 5–15)
BUN: 18 mg/dL (ref 8–23)
CO2: 23 mmol/L (ref 22–32)
Calcium: 8.8 mg/dL — ABNORMAL LOW (ref 8.9–10.3)
Chloride: 110 mmol/L (ref 98–111)
Creatinine, Ser: 1.16 mg/dL — ABNORMAL HIGH (ref 0.44–1.00)
GFR, Estimated: 46 mL/min — ABNORMAL LOW (ref >60)
Glucose, Bld: 91 mg/dL (ref 70–99)
Potassium: 4.5 mmol/L (ref 3.5–5.1)
Sodium: 140 mmol/L (ref 135–145)
Total Bilirubin: 0.4 mg/dL (ref 0.3–1.2)
Total Protein: 4.8 g/dL — ABNORMAL LOW (ref 6.5–8.1)

## 2023-02-01 LAB — CBC
HCT: 32.1 % — ABNORMAL LOW (ref 36.0–46.0)
Hemoglobin: 10.4 g/dL — ABNORMAL LOW (ref 12.0–15.0)
MCH: 28.9 pg (ref 26.0–34.0)
MCHC: 32.4 g/dL (ref 30.0–36.0)
MCV: 89.2 fL (ref 80.0–100.0)
Platelets: 223 10*3/uL (ref 150–400)
RBC: 3.6 MIL/uL — ABNORMAL LOW (ref 3.87–5.11)
RDW: 16.1 % — ABNORMAL HIGH (ref 11.5–15.5)
WBC: 6.6 10*3/uL (ref 4.0–10.5)
nRBC: 0 % (ref 0.0–0.2)

## 2023-02-01 NOTE — TOC Progression Note (Addendum)
[employer] offering peer-to-peer for PTAR which needs to be completed by 5pm. MD to call [employer] provider Dr. Logan Bores at [phone]  to complete review. MD updated.   UPDATE: ambulance transport denied.   Dellie Burns, MSW, LCSW [phone] (coverage)

## 2023-02-01 NOTE — Plan of Care (Signed)

## 2023-02-01 NOTE — Progress Notes (Signed)
PROGRESS NOTE    [name]  ZOX:[id number] DOB: 09/04/1935 DOA: 01/25/2023 PCP: Benita Stabile, MD  Chief Complaint  Patient presents with   Abdominal Pain    Brief Narrative:   [name] is [name] 87 y.o. female with medical history CKD stage IV, significant of essential hypertension, paroxysmal atrial fibrillation on Eliquis 2.5 mg twice daily, hypothyroidism, hyperlipidemia, history of CVA/TIA, and chronic anemia presented to emergency department for evaluation for abdominal pain.  Initial suspicion for cholecystitis, but hida scan negative.  Concern for contribution from constipation?  Pending pacemaker placement then possible discharge pending symptoms.   Assessment & Plan:   Principal Problem:   Acute cholecystitis Active Problems:   Acute anemia   Hypothyroidism   History of CVA (cerebrovascular accident)   Hyperlipidemia   Memory impairment   CKD (chronic kidney disease), stage IV (HCC)   Acute kidney injury superimposed on CKD (HCC)   Atrial fibrillation, chronic (HCC)   Chronic back pain due to kyphosis  Rash  Right Upper Quadrant Pain  Right Flank Pain CT abd/pelvis with pericholecystic fluid, RUQ [place] with small gallstone, 9 mm gallbladder wall polyp, mild gallbladder wall thickening HIDA without common duct or cystic duct obstruction She continues to have pain which seems more impressive than would be expected based on her exam.  No clear cause.  She does have [name] mild rash on her R flank and similar tenderness to her back - pain possibly in [name] dermatomal pattern.  Could shingles explain this?  Mild rash is present, but not vesicular, not classic for shingles.  I think given her persistent symptoms and rash, will empirically treat and follow rash.   Continued discomfort today, imaging without clear cause (no visible rash or other findings on physical exam to explain discomfort) She notes constipation recently, this maybe contributing to her discomfort Bowel  regimen Consider GI follow up outpatient   Right Breast Concern She notes concern for leaking of breast implant? CT chest with linear folds vs rupture of R breast implant capsule Will need to follow up with plastics (will call Monday)  History Stroke  New Right Sided Ataxia Noted, eliquis on hold She refused MRI due to back pain -> head CT is without any acute intracranial abnormality Will repeat head CT 9/6 - stable Notes symptoms present for [name] few days, unclear exactly when they started - abnormal FNF and heel to shin on R on exam Echo with EF 60-65%, no RWMA, grade II diastolic dysfunction Unfortunately with recent pacemaker placement, unable to get MRI at this time A1c 5.6, LDL 78 PT/OT Needs neurology follow up outpatient  Eliquis on hold x5 days due to below, will resume once this hold is complete (9/10) not clear why she's not on statin - will discuss  Tachybradycardia Syndrome Symptomatic Bradycardia History of Paroxysmal Atrial Fibrillation Appreciate cardiology assistance, planning for permanent pacemaker today Echo with EF 60-65%, no RWMA, grade II diastolic dysfunction Home eliquis currently on hold - resume eliquis 9/10  Flecainide is on hold  S/p PPM implant  Mild normocytic anemia: Hb trending 10-11 here.  Normal iron, b12, folate.  Trend outpatient.  AKI on CKD stage IIIb: Baseline creatinine around 1.3-1.9, she presented with 2.53 which is improved to 2.06.  Back to baseline today.  Chronic grade 3 diastolic heart failure: Appears to be euvolemic.  Holding Lasix and losartan in the setting of AKI -> can resume at discharge or in the next day or so.  Hypothyroidism, acquired: Continue Synthroid.   Hyperlipidemia: Continue ezetimibe.   Memory impairment: Continue memantine.  She is currently fully alert and oriented.   Chronic low back pain and right hip pain: Patient states that she lives with the pain.  Current pain seems to be at her baseline.    Essential hypertension: Stable.  Continue home dose of amlodipine but holding losartan and lasix.    DVT prophylaxis: SCD Code Status: full Family Communication: none Disposition:   Status is: Inpatient Remains inpatient appropriate because: none   Consultants:  Cardiology surgery  Procedures:  Echo IMPRESSIONS     1. Left ventricular ejection fraction, by estimation, is 60 to 65%. The  left ventricle has normal function. The left ventricle has no regional  wall motion abnormalities. Left ventricular diastolic parameters are  consistent with Grade II diastolic  dysfunction (pseudonormalization).   2. Right ventricular systolic function is normal. The right ventricular  size is normal. There is mildly elevated pulmonary artery systolic  pressure. The estimated right ventricular systolic pressure is 43.1 mmHg.   3. Left atrial size was moderately dilated.   4. The mitral valve is normal in structure. Mild mitral valve  regurgitation. No evidence of mitral stenosis.   5. The aortic valve is calcified. There is mild calcification of the  aortic valve. Aortic valve regurgitation is trivial. Aortic valve  sclerosis/calcification is present, without any evidence of aortic  stenosis.   6. The inferior vena cava is dilated in size with <50% respiratory  variability, suggesting right atrial pressure of 15 mmHg.   Comparison(s): No significant change from prior study.  Antimicrobials:  Anti-infectives (From admission, onward)    Start     Dose/Rate Route Frequency Ordered Stop   01/31/23 2200  valACYclovir (VALTREX) tablet 1,000 mg        1,000 mg Oral 2 times daily 01/31/23 1541 02/07/23 2159   01/29/23 1400  ceFAZolin (ANCEF) IVPB 1 g/50 mL premix        1 g 100 mL/hr over 30 Minutes Intravenous Every 8 hours 01/29/23 1106 01/30/23 0553   01/28/23 0900  gentamicin (GARAMYCIN) 80 mg in sodium chloride 0.9 % 500 mL irrigation        80 mg Irrigation On call 01/27/23 1939  01/28/23 1222   01/28/23 0900  ceFAZolin (ANCEF) IVPB 2g/100 mL premix        2 g 200 mL/hr over 30 Minutes Intravenous On call 01/27/23 1939 01/28/23 1222   01/26/23 0400  cefTRIAXone (ROCEPHIN) 2 g in sodium chloride 0.9 % 100 mL IVPB  Status:  Discontinued        2 g 200 mL/hr over 30 Minutes Intravenous Every 24 hours 01/26/23 0348 01/27/23 1717   01/26/23 0400  metroNIDAZOLE (FLAGYL) IVPB 500 mg  Status:  Discontinued        500 mg 100 mL/hr over 60 Minutes Intravenous Every 12 hours 01/26/23 0348 01/27/23 0957       Subjective: Frustrated with overnight  Objective: Vitals:   01/31/23 1646 01/31/23 1943 02/01/23 0349 02/01/23 0846  BP: (!) 135/56 112/71 120/66 129/62  Pulse: 79 91 89 75  Resp: 18 18 14 18   Temp: 98.3 F (36.8 C) 97.8 F (36.6 C) 97.9 F (36.6 C) 98.5 F (36.9 C)  TempSrc: Oral Oral Oral Oral  SpO2: 96% 98% 97% 97%  Weight:      Height:        Intake/Output Summary (Last 24 hours) at 02/01/2023 1403 Last data  filed at 02/01/2023 1000 Gross per 24 hour  Intake 480 ml  Output 0 ml  Net 480 ml   Filed Weights   01/26/23 0522 01/30/23 0555  Weight: 62.9 kg 70.4 kg    Examination:  General: No acute distress. Cardiovascular: RRR Lungs: unlabored Neurological: Alert and oriented 3. Moves all extremities 4 with equal strength. Cranial nerves II through XII grossly intact. Stable rash to R flank  Extremities: No clubbing or cyanosis. No edema.   Data Reviewed: I have personally reviewed following labs and imaging studies  CBC: Recent Labs  Lab 01/28/23 0632 01/29/23 0942 01/30/23 0702 01/31/23 1137 02/01/23 0639  WBC 6.2 6.5 5.6 6.3 6.6  HGB 11.7* 11.1* 11.0* 11.3* 10.4*  HCT 36.5 34.9* 33.7* 34.3* 32.1*  MCV 90.3 93.3 91.6 90.5 89.2  PLT 253 230 224 218 223    Basic Metabolic Panel: Recent Labs  Lab 01/25/23 2340 01/26/23 0542 01/28/23 0632 01/29/23 0942 01/30/23 0702 01/31/23 1137 02/01/23 0639  NA 138   < > 140 140 140  138 140  K 4.8   < > 3.9 4.5 3.9 3.8 4.5  CL 104   < > 108 109 107 108 110  CO2 22   < > 19* 25 25 24 23   GLUCOSE 104*   < > 93 114* 83 98 91  BUN 41*   < > 26* 15 11 12 18   CREATININE 2.53*   < > 1.34* 1.24* 1.07* 0.96 1.16*  CALCIUM 8.9   < > 8.2* 8.1* 8.1* 8.3* 8.8*  MG 2.6*  --   --   --   --   --   --    < > = values in this interval not displayed.    GFR: Estimated Creatinine Clearance: 30.3 mL/min (Omare Bilotta) (by C-G formula based on SCr of 1.16 mg/dL (H)).  Liver Function Tests: Recent Labs  Lab 01/28/23 1610 01/29/23 0942 01/30/23 0702 01/31/23 1137 02/01/23 0639  AST 35 35 29 36 41  ALT 41 30 22 22 25   ALKPHOS 55 53 48 48 44  BILITOT 0.6 0.7 0.6 0.5 0.4  PROT 5.7* 5.2* 5.1* 5.4* 4.8*  ALBUMIN 3.4* 3.1* 2.9* 3.0* 2.8*    CBG: Recent Labs  Lab 01/28/23 2157  GLUCAP 89     Recent Results (from the past 240 hour(s))  Culture, blood (Routine X 2) w Reflex to ID Panel     Status: None   Collection Time: 01/26/23  5:35 AM   Specimen: BLOOD  Result Value Ref Range Status   Specimen Description BLOOD BLOOD RIGHT HAND  Final   Special Requests   Final    BOTTLES DRAWN AEROBIC AND ANAEROBIC Blood Culture adequate volume   Culture   Final    NO GROWTH 5 DAYS Performed at [facility], [address] [place], [place] [address]    Report Status 01/31/2023 FINAL  Final  Culture, blood (Routine X 2) w Reflex to ID Panel     Status: None   Collection Time: 01/26/23  5:37 AM   Specimen: BLOOD  Result Value Ref Range Status   Specimen Description BLOOD BLOOD LEFT HAND  Final   Special Requests   Final    BOTTLES DRAWN AEROBIC AND ANAEROBIC Blood Culture adequate volume   Culture   Final    NO GROWTH 5 DAYS Performed at [facility], [address] [place], [place] [address]    Report Status 01/31/2023 FINAL  Final  Surgical PCR screen     Status: None   Collection Time: 01/27/23  7:39 PM   Specimen: Nasal Mucosa; Nasal Swab  Result Value Ref Range  Status   MRSA, PCR NEGATIVE NEGATIVE Final   Staphylococcus aureus NEGATIVE NEGATIVE Final    Comment: (NOTE) The Xpert SA Assay (FDA approved for NASAL specimens in patients 26 years of age and older), is one component of T[facility]lyn [facility]gley comprehensive surveillance program. It is not intended to diagnose infection nor to guide or monitor treatment. Performed at [facility], [address] [place], [place] [address]          Radiology Studies: CT CHEST WO CONTRAST  Result Date: 01/30/2023 CLINICAL DATA:  Right chest wall/breast pain. EXAM: CT CHEST WITHOUT CONTRAST TECHNIQUE: Multidetector CT imaging of the chest was performed following the standard protocol without IV contrast. RADIATION DOSE REDUCTION: This exam was performed according to the departmental dose-optimization program which includes automated exposure control, adjustment of the mA and/or kV according to patient size and/or use of iterative reconstruction technique. COMPARISON:  CT of the chest abdomen pelvis dated 01/01/2023. FINDINGS: Evaluation of this exam is limited in the absence of intravenous contrast. Cardiovascular: There is no cardiomegaly or pericardial effusion. Left pectoral pacemaker device. Coronary vascular calcification noted. Mild atherosclerotic calcification of the thoracic aorta. No aneurysmal dilatation. The central pulmonary arteries are grossly unremarkable on this noncontrast CT. Mediastinum/Nodes: No hilar or mediastinal adenopathy. Evaluation however is limited in the absence of intravenous contrast. The esophagus is grossly unremarkable. No mediastinal fluid collection. Lungs/Pleura: Small bilateral pleural effusions, new since the prior CT. Large areas of consolidation with air bronchogram involving the lung bases bilaterally may represent atelectasis or pneumonia. Clinical correlation is recommended. There is no pneumothorax. The central airways are patent. Upper Abdomen: No acute abnormality. Musculoskeletal:  Bilateral breast implants. There is calcification of the left breast implant capsule. Linear folds versus rupture of the right breast implant capsule. There is osteopenia with degenerative changes of the spine. Old T12 compression fracture and vertebroplasty. No acute osseous pathology. Small pockets of air in the left anterior chest wall related to pacemaker placement. No fluid collection or hematoma. Mild thickening of the skin of the left breast. Clinical correlation is recommended. IMPRESSION: 1. Small bilateral pleural effusions bibasilar atelectasis or pneumonia. 2. Bilateral breast implants. Linear folds versus rupture of the right breast implant capsule. 3.  Aortic Atherosclerosis (ICD10-I70.0). Electronically Signed   By: Elgie Collard M.D.   On: 01/30/2023 19:50        Scheduled Meds:  amLODipine  10 mg Oral Daily   diclofenac Sodium  2 g Topical QID   ezetimibe  10 mg Oral Daily   ferrous gluconate  324 mg Oral Daily   gabapentin  100 mg Oral TID   levothyroxine  75 mcg Oral QAC breakfast   memantine  10 mg Oral BID   pantoprazole  40 mg Oral Daily   polyethylene glycol  17 g Oral BID   sodium chloride flush  3 mL Intravenous Q12H   valACYclovir  1,000 mg Oral BID   Continuous Infusions:  sodium chloride       LOS: 6 days    Time spent: over 30 min    Lacretia Nicks, MD [facility]   To contact the attending provider between 7A-7P or the covering provider during after hours 7P-7A, please log into the web site [email or web address] and access using universal [place] password for that web site. If  you do not have the password, please call the hospital operator.  02/01/2023, 2:03 PM

## 2023-02-02 ENCOUNTER — Inpatient Hospital Stay (HOSPITAL_COMMUNITY): Payer: PPO

## 2023-02-02 DIAGNOSIS — K810 Acute cholecystitis: Secondary | ICD-10-CM | POA: Diagnosis not present

## 2023-02-02 LAB — CBC
HCT: 32.5 % — ABNORMAL LOW (ref 36.0–46.0)
Hemoglobin: 10.4 g/dL — ABNORMAL LOW (ref 12.0–15.0)
MCH: 28.7 pg (ref 26.0–34.0)
MCHC: 32 g/dL (ref 30.0–36.0)
MCV: 89.8 fL (ref 80.0–100.0)
Platelets: 202 10*3/uL (ref 150–400)
RBC: 3.62 MIL/uL — ABNORMAL LOW (ref 3.87–5.11)
RDW: 16.3 % — ABNORMAL HIGH (ref 11.5–15.5)
WBC: 6.8 10*3/uL (ref 4.0–10.5)
nRBC: 0 % (ref 0.0–0.2)

## 2023-02-02 LAB — BASIC METABOLIC PANEL
Anion gap: 8 (ref 5–15)
BUN: 26 mg/dL — ABNORMAL HIGH (ref 8–23)
CO2: 23 mmol/L (ref 22–32)
Calcium: 8.7 mg/dL — ABNORMAL LOW (ref 8.9–10.3)
Chloride: 106 mmol/L (ref 98–111)
Creatinine, Ser: 1.37 mg/dL — ABNORMAL HIGH (ref 0.44–1.00)
GFR, Estimated: 38 mL/min — ABNORMAL LOW (ref >60)
Glucose, Bld: 85 mg/dL (ref 70–99)
Potassium: 4.3 mmol/L (ref 3.5–5.1)
Sodium: 137 mmol/L (ref 135–145)

## 2023-02-02 MED ORDER — IPRATROPIUM-ALBUTEROL 0.5-2.5 (3) MG/3ML IN SOLN
3.0000 mL | Freq: Four times a day (QID) | RESPIRATORY_TRACT | Status: DC
  Administered 2023-02-02 – 2023-02-03 (×4): 3 mL via RESPIRATORY_TRACT
  Filled 2023-02-02 (×4): qty 3

## 2023-02-02 MED ORDER — ALBUTEROL SULFATE (2.5 MG/3ML) 0.083% IN NEBU: 3.0000 mL | INHALATION_SOLUTION | RESPIRATORY_TRACT | Status: DC | PRN

## 2023-02-02 MED ORDER — VALACYCLOVIR HCL 500 MG PO TABS
1000.0000 mg | ORAL_TABLET | Freq: Every day | ORAL | Status: DC
  Administered 2023-02-02 – 2023-02-03 (×2): 1000 mg via ORAL
  Filled 2023-02-02 (×2): qty 2

## 2023-02-02 MED ORDER — PREDNISONE 20 MG PO TABS
40.0000 mg | ORAL_TABLET | Freq: Every day | ORAL | Status: DC
  Administered 2023-02-02 – 2023-02-03 (×2): 40 mg via ORAL
  Filled 2023-02-02 (×2): qty 2

## 2023-02-02 NOTE — Progress Notes (Signed)
Occupational Therapy Treatment Patient Details Name: [name] MRN: [id number] DOB: 08/15/1935 Today's Date: 02/02/2023   History of present illness 87 y.o. female presents to [facility] hospital on 01/25/2023 with abdominal pain. Pt found to have gall stones. She is now s/p Pacemaker placement on 01/28/23. PMH includes CKD IV, HTN, PAF, hypothyroidism, HLD, CVA.   OT comments  Patient received in supine and eager to get OOB. Patient was min assist to get to EOB with log rolling and stood from EOB with min assist. Patient stepping forward and back 2-3 times with LLE before able to perform transfers. Patient performed grooming seated in recliner due to reliant on BUE support when standing. Pacemaker precautions reviewed with patient who continues to have difficulty with recall. Patient will benefit from continued inpatient follow up therapy, <3 hours/day to continue to address self care and functional transfers. Acute OT to continue to follow.       If plan is discharge home, recommend the following:  A little help with walking and/or transfers;A lot of help with bathing/dressing/bathroom;Assistance with cooking/housework;Assist for transportation   Equipment Recommendations  Other (comment) (defer)    Recommendations for Other Services      Precautions / Restrictions Precautions Precautions: Fall;ICD/Pacemaker Precaution Comments: Pacemaker, log roll for bed mobility Restrictions Weight Bearing Restrictions: No       Mobility Bed Mobility Overal bed mobility: Needs Assistance Bed Mobility: Rolling, Sidelying to Sit Rolling: Min assist Sidelying to sit: Min assist       General bed mobility comments: min assist with assistance for raising trunk and scoot to EOB    Transfers Overall transfer level: Needs assistance Equipment used: Rolling walker (2 wheels) Transfers: Sit to/from Stand, Bed to chair/wheelchair/BSC Sit to Stand: Min assist     Step pivot transfers: Min  assist, Contact guard assist     General transfer comment: assistance for stability     Balance Overall balance assessment: Needs assistance Sitting-balance support: No upper extremity supported, Feet supported Sitting balance-Leahy Scale: Good     Standing balance support: During functional activity, Bilateral upper extremity supported, Single extremity supported Standing balance-Leahy Scale: Poor Standing balance comment: reliant on ext support                           ADL either performed or assessed with clinical judgement   ADL Overall ADL's : Needs assistance/impaired Eating/Feeding: Set up;Sitting Eating/Feeding Details (indicate cue type and reason): provided setup for lunch Grooming: Wash/dry hands;Wash/dry face;Oral care;Brushing hair;Set up;Sitting                   Toilet Transfer: Minimal assistance;Contact guard assist;Rolling walker (2 wheels) Toilet Transfer Details (indicate cue type and reason): simulated to recliner           General ADL Comments: grooming performed in sitting due to reliant on BUE support when standing    Extremity/Trunk Assessment              Vision       Perception     Praxis      Cognition Arousal: Alert Behavior During Therapy: WFL for tasks assessed/performed Overall Cognitive Status: No family/caregiver present to determine baseline cognitive functioning                       Memory: Decreased recall of precautions         General Comments: unable to recall pacemaker precautions  Exercises      Shoulder Instructions       General Comments reviewed pacemaker precautions    Pertinent Vitals/ Pain       Pain Assessment Pain Assessment: Faces Faces Pain Scale: Hurts little more Pain Location: back and RUQ pain Pain Descriptors / Indicators: Aching, Discomfort, Grimacing, Guarding, Pressure Pain Intervention(s): Limited activity within patient's tolerance, Monitored  during session, Repositioned  Home Living                                          Prior Functioning/Environment              Frequency  Min 1X/week        Progress Toward Goals  OT Goals(current goals can now be found in the care plan section)  Progress towards OT goals: Progressing toward goals  Acute Rehab OT Goals Patient Stated Goal: get better OT Goal Formulation: With patient Time For Goal Achievement: 02/11/23 Potential to Achieve Goals: Good ADL Goals Pt Will Perform Grooming: standing;with supervision Pt Will Perform Upper Body Dressing: with modified independence;sitting Pt Will Perform Lower Body Dressing: with min assist;sit to/from stand Pt Will Transfer to Toilet: ambulating;with contact guard assist Additional ADL Goal #1: pt will independently verablize understanding of pacemaker precautions  Plan      Co-evaluation                 AM-PAC OT "6 Clicks" Daily Activity     Outcome Measure   Help from another person eating meals?: A Little Help from another person taking care of personal grooming?: A Little Help from another person toileting, which includes using toliet, bedpan, or urinal?: A Lot Help from another person bathing (including washing, rinsing, drying)?: A Lot Help from another person to put on and taking off regular upper body clothing?: A Lot Help from another person to put on and taking off regular lower body clothing?: A Lot 6 Click Score: 14    End of Session Equipment Utilized During Treatment: Gait belt;Rolling walker (2 wheels)  OT Visit Diagnosis: Unsteadiness on feet (R26.81);Other abnormalities of gait and mobility (R26.89);Pain Pain - part of body:  (back)   Activity Tolerance Patient tolerated treatment well   Patient Left in chair;with call bell/phone within reach   Nurse Communication Mobility status        Time: 1610-9604 OT Time Calculation (min): 35 min  Charges: OT General  Charges $OT Visit: 1 Visit OT Treatments $Self Care/Home Management : 8-22 mins $Therapeutic Activity: 8-22 mins  Alfonse Flavors, OTA Acute Rehabilitation Services  Office [phone]   Dewain Penning 02/02/2023, 1:48 PM

## 2023-02-02 NOTE — Progress Notes (Signed)
PROGRESS NOTE    [name]  ZOX:[id number] DOB: 1935/07/08 DOA: 01/25/2023 PCP: Benita Stabile, MD  Chief Complaint  Patient presents with   Abdominal Pain    Brief Narrative:   [name] is [name] 87 y.o. female with medical history CKD stage IV, significant of essential hypertension, paroxysmal atrial fibrillation on Eliquis 2.5 mg twice daily, hypothyroidism, hyperlipidemia, history of CVA/TIA, and chronic anemia presented to emergency department for evaluation for abdominal pain.    Initial suspicion for cholecystitis, but hida scan negative.  At this time, we're treating for possible shingles given mild rash to R flank and pain greater than expected based on exam, though the diagnosis is unclear.    Hospitalization complicated by bradycardia, now s/p pacemaker placement. She also had R sided ataxia with head CT negative for stroke x2 (unable to do MRI due to recent pacemaker placement).   At this time, pending discharge to SNF.  On hold today due to wheezing and concern for asthma exacerbation.  Possible discharge 9/10 if stable/improved.    Assessment & Plan:   Principal Problem:   Acute cholecystitis Active Problems:   Acute anemia   Hypothyroidism   History of CVA (cerebrovascular accident)   Hyperlipidemia   Memory impairment   CKD (chronic kidney disease), stage IV (HCC)   Acute kidney injury superimposed on CKD (HCC)   Atrial fibrillation, chronic (HCC)   Chronic back pain due to kyphosis  Shortness of Breath  Wheezing She feels she's having asthma exacerbation today Steroids, scheduled and prn nebs Follow CXR  Rash  Right Upper Quadrant Pain  Right Flank Pain Possible Shingles CT abd/pelvis with pericholecystic fluid, RUQ [place] with small gallstone, 9 mm gallbladder wall polyp, mild gallbladder wall thickening HIDA without common duct or cystic duct obstruction.  Initially concern for cholecystitis, surgery now signed off (9/3 with negative  Hida).  She continues to have pain which seems more impressive than would be expected based on her exam.  No clear cause.  She does have [name] mild rash on her R flank (improving at this time) and similar tenderness to her back - pain possibly in [name] dermatomal pattern.  Could shingles explain this?  Mild rash is present, but not clearly vesicular, not classic for shingles.  I think given her persistent symptoms and rash, will empirically treat and follow rash.   Continued discomfort today, imaging without clear cause (no visible rash or other findings on physical exam to explain discomfort) She notes constipation recently, this maybe contributing to her discomfort Bowel regimen Consider GI follow up outpatient   Right Breast Concern She notes concern for leaking of breast implant? CT chest with linear folds vs rupture of R breast implant capsule Will need to follow up with plastics (I planned to call 9/9, but wasn't able to, please call 9/10 to discuss)  History Stroke  New Right Sided Ataxia Noted, eliquis on hold She refused MRI due to back pain -> head CT is without any acute intracranial abnormality Will repeat head CT 9/6 - stable.   Notes symptoms present for [name] few days, unclear exactly when they started - abnormal FNF and heel to shin on R on exam Echo with EF 60-65%, no RWMA, grade II diastolic dysfunction Unfortunately with recent pacemaker placement, unable to get MRI at this time A1c 5.6, LDL 78 PT/OT Needs neurology follow up outpatient  Eliquis on hold x5 days due to below, will resume once this hold is complete (9/10) not  clear why she's not on statin - will need to discuss prior to discharge   Tachybradycardia Syndrome Symptomatic Bradycardia History of Paroxysmal Atrial Fibrillation Appreciate cardiology assistance, s/p pacemaker implant 01/28/2023 Echo with EF 60-65%, no RWMA, grade II diastolic dysfunction Home eliquis currently on hold - resume eliquis 9/10  Flecainide is  on hold  S/p PPM implant  Mild normocytic anemia: Hb trending 10-11 here.  Normal iron, b12, folate.  Trend outpatient.  AKI on CKD stage IIIb: Baseline creatinine around 1.3-1.9, she presented with 2.53 which is improved to 2.06.  Fluctuating over past few days, still around baseline.  Chronic grade 3 diastolic heart failure: Appears to be euvolemic.  Holding Lasix and losartan in the setting of AKI -> can resume at discharge or in the next day or so.   Hypothyroidism, acquired: Continue Synthroid.   Hyperlipidemia: Continue ezetimibe.   Memory impairment: Continue memantine.  She is currently fully alert and oriented.   Chronic low back pain and right hip pain: Patient states that she lives with the pain.  Current pain seems to be at her baseline.   Essential hypertension: Stable.  Continue home dose of amlodipine but holding losartan and lasix.    DVT prophylaxis: SCD Code Status: full Family Communication: none Disposition:   Status is: Inpatient Remains inpatient appropriate because: none   Consultants:  Cardiology surgery  Procedures:  Echo IMPRESSIONS     1. Left ventricular ejection fraction, by estimation, is 60 to 65%. The  left ventricle has normal function. The left ventricle has no regional  wall motion abnormalities. Left ventricular diastolic parameters are  consistent with Grade II diastolic  dysfunction (pseudonormalization).   2. Right ventricular systolic function is normal. The right ventricular  size is normal. There is mildly elevated pulmonary artery systolic  pressure. The estimated right ventricular systolic pressure is 43.1 mmHg.   3. Left atrial size was moderately dilated.   4. The mitral valve is normal in structure. Mild mitral valve  regurgitation. No evidence of mitral stenosis.   5. The aortic valve is calcified. There is mild calcification of the  aortic valve. Aortic valve regurgitation is trivial. Aortic valve   sclerosis/calcification is present, without any evidence of aortic  stenosis.   6. The inferior vena cava is dilated in size with <50% respiratory  variability, suggesting right atrial pressure of 15 mmHg.   Comparison(s): No significant change from prior study.  Antimicrobials:  Anti-infectives (From admission, onward)    Start     Dose/Rate Route Frequency Ordered Stop   02/02/23 1000  valACYclovir (VALTREX) tablet 1,000 mg        1,000 mg Oral Daily 02/02/23 0754 02/08/23 0959   01/31/23 2200  valACYclovir (VALTREX) tablet 1,000 mg  Status:  Discontinued        1,000 mg Oral 2 times daily 01/31/23 1541 02/02/23 0754   01/29/23 1400  ceFAZolin (ANCEF) IVPB 1 g/50 mL premix        1 g 100 mL/hr over 30 Minutes Intravenous Every 8 hours 01/29/23 1106 01/30/23 0553   01/28/23 0900  gentamicin (GARAMYCIN) 80 mg in sodium chloride 0.9 % 500 mL irrigation        80 mg Irrigation On call 01/27/23 1939 01/28/23 1222   01/28/23 0900  ceFAZolin (ANCEF) IVPB 2g/100 mL premix        2 g 200 mL/hr over 30 Minutes Intravenous On call 01/27/23 1939 01/28/23 1222   01/26/23 0400  cefTRIAXone (ROCEPHIN) 2  g in sodium chloride 0.9 % 100 mL IVPB  Status:  Discontinued        2 g 200 mL/hr over 30 Minutes Intravenous Every 24 hours 01/26/23 0348 01/27/23 1717   01/26/23 0400  metroNIDAZOLE (FLAGYL) IVPB 500 mg  Status:  Discontinued        500 mg 100 mL/hr over 60 Minutes Intravenous Every 12 hours 01/26/23 0348 01/27/23 0957       Subjective: C/o SOB, wheezing  Objective: Vitals:   02/01/23 1545 02/01/23 2247 02/02/23 0622 02/02/23 1051  BP: (!) 151/61 133/70 128/72 135/61  Pulse: 74 72 76 74  Resp: 15 20 19    Temp: (!) 97.5 F (36.4 C) 98.5 F (36.9 C) 98.2 F (36.8 C) 98 F (36.7 C)  TempSrc: Oral   Oral  SpO2: 99% 97% 97% 98%  Weight:      Height:        Intake/Output Summary (Last 24 hours) at 02/02/2023 1840 Last data filed at 02/02/2023 0102 Gross per 24 hour  Intake --   Output 0 ml  Net 0 ml   Filed Weights   01/26/23 0522 01/30/23 0555  Weight: 62.9 kg 70.4 kg    Examination:  General: No acute distress. Cardiovascular: RRR Lungs: scattered wheezing Abdomen: continued RUQ pain Improving R flank rash  Neurological: Alert and oriented 3. Moves all extremities 4 with equal strength. Cranial nerves II through XII grossly intact. Extremities: No clubbing or cyanosis. No edema.   Data Reviewed: I have personally reviewed following labs and imaging studies  CBC: Recent Labs  Lab 01/29/23 0942 01/30/23 0702 01/31/23 1137 02/01/23 0639 02/02/23 0617  WBC 6.5 5.6 6.3 6.6 6.8  HGB 11.1* 11.0* 11.3* 10.4* 10.4*  HCT 34.9* 33.7* 34.3* 32.1* 32.5*  MCV 93.3 91.6 90.5 89.2 89.8  PLT 230 224 218 223 202    Basic Metabolic Panel: Recent Labs  Lab 01/29/23 0942 01/30/23 0702 01/31/23 1137 02/01/23 0639 02/02/23 0617  NA 140 140 138 140 137  K 4.5 3.9 3.8 4.5 4.3  CL 109 107 108 110 106  CO2 25 25 24 23 23   GLUCOSE 114* 83 98 91 85  BUN 15 11 12 18  26*  CREATININE 1.24* 1.07* 0.96 1.16* 1.37*  CALCIUM 8.1* 8.1* 8.3* 8.8* 8.7*    GFR: Estimated Creatinine Clearance: 25.6 mL/min (Gayna Braddy) (by C-G formula based on SCr of 1.37 mg/dL (H)).  Liver Function Tests: Recent Labs  Lab 01/28/23 7253 01/29/23 0942 01/30/23 0702 01/31/23 1137 02/01/23 0639  AST 35 35 29 36 41  ALT 41 30 22 22 25   ALKPHOS 55 53 48 48 44  BILITOT 0.6 0.7 0.6 0.5 0.4  PROT 5.7* 5.2* 5.1* 5.4* 4.8*  ALBUMIN 3.4* 3.1* 2.9* 3.0* 2.8*    CBG: Recent Labs  Lab 01/28/23 2157  GLUCAP 89     Recent Results (from the past 240 hour(s))  Culture, blood (Routine X 2) w Reflex to ID Panel     Status: None   Collection Time: 01/26/23  5:35 AM   Specimen: BLOOD  Result Value Ref Range Status   Specimen Description BLOOD BLOOD RIGHT HAND  Final   Special Requests   Final    BOTTLES DRAWN AEROBIC AND ANAEROBIC Blood Culture adequate volume   Culture   Final    NO  GROWTH 5 DAYS Performed at [facility], [address], [place], [place] [address]    Report Status 01/31/2023 FINAL  Final  Culture,  blood (Routine X 2) w Reflex to ID Panel     Status: None   Collection Time: 01/26/23  5:37 AM   Specimen: BLOOD  Result Value Ref Range Status   Specimen Description BLOOD BLOOD LEFT HAND  Final   Special Requests   Final    BOTTLES DRAWN AEROBIC AND ANAEROBIC Blood Culture adequate volume   Culture   Final    NO GROWTH 5 DAYS Performed at [facility], [address], [place], [place] [address]    Report Status 01/31/2023 FINAL  Final  Surgical PCR screen     Status: None   Collection Time: 01/27/23  7:39 PM   Specimen: Nasal Mucosa; Nasal Swab  Result Value Ref Range Status   MRSA, PCR NEGATIVE NEGATIVE Final   Staphylococcus aureus NEGATIVE NEGATIVE Final    Comment: (NOTE) The Xpert SA Assay (FDA approved for NASAL specimens in patients 27 years of age and older), is one component of Meena Barrantes comprehensive surveillance program. It is not intended to diagnose infection nor to guide or monitor treatment. Performed at [facility], [address], [place], [place] [address]          Radiology Studies: No results found.      Scheduled Meds:  amLODipine  10 mg Oral Daily   diclofenac Sodium  2 g Topical QID   ezetimibe  10 mg Oral Daily   ferrous gluconate  324 mg Oral Daily   gabapentin  100 mg Oral TID   ipratropium-albuterol  3 mL Nebulization Q6H WA   levothyroxine  75 mcg Oral QAC breakfast   memantine  10 mg Oral BID   pantoprazole  40 mg Oral Daily   polyethylene glycol  17 g Oral BID   predniSONE  40 mg Oral Q breakfast   sodium chloride flush  3 mL Intravenous Q12H   valACYclovir  1,000 mg Oral Daily   Continuous Infusions:  sodium chloride       LOS: 7 days    Time spent: over 30 min    Lacretia Nicks, MD [facility]   To contact the attending provider between 7A-7P or the covering  provider during after hours 7P-7A, please log into the web site [email or web address] and access using universal [place] password for that web site. If you do not have the password, please call the hospital operator.  02/02/2023, 6:40 PM

## 2023-02-03 ENCOUNTER — Ambulatory Visit: Payer: PPO | Admitting: Cardiology

## 2023-02-03 DIAGNOSIS — I482 Chronic atrial fibrillation, unspecified: Secondary | ICD-10-CM | POA: Diagnosis not present

## 2023-02-03 DIAGNOSIS — N1832 Chronic kidney disease, stage 3b: Secondary | ICD-10-CM | POA: Diagnosis not present

## 2023-02-03 DIAGNOSIS — I495 Sick sinus syndrome: Secondary | ICD-10-CM | POA: Diagnosis not present

## 2023-02-03 DIAGNOSIS — D649 Anemia, unspecified: Secondary | ICD-10-CM | POA: Diagnosis not present

## 2023-02-03 DIAGNOSIS — N130 Hydronephrosis with ureteropelvic junction obstruction: Secondary | ICD-10-CM | POA: Diagnosis not present

## 2023-02-03 DIAGNOSIS — R488 Other symbolic dysfunctions: Secondary | ICD-10-CM | POA: Diagnosis not present

## 2023-02-03 DIAGNOSIS — I639 Cerebral infarction, unspecified: Secondary | ICD-10-CM | POA: Diagnosis not present

## 2023-02-03 DIAGNOSIS — G8929 Other chronic pain: Secondary | ICD-10-CM | POA: Diagnosis not present

## 2023-02-03 DIAGNOSIS — Z741 Need for assistance with personal care: Secondary | ICD-10-CM | POA: Diagnosis not present

## 2023-02-03 DIAGNOSIS — E039 Hypothyroidism, unspecified: Secondary | ICD-10-CM | POA: Diagnosis not present

## 2023-02-03 DIAGNOSIS — Z950 Presence of cardiac pacemaker: Secondary | ICD-10-CM | POA: Diagnosis not present

## 2023-02-03 DIAGNOSIS — K219 Gastro-esophageal reflux disease without esophagitis: Secondary | ICD-10-CM | POA: Diagnosis not present

## 2023-02-03 DIAGNOSIS — Z7901 Long term (current) use of anticoagulants: Secondary | ICD-10-CM | POA: Diagnosis not present

## 2023-02-03 DIAGNOSIS — F329 Major depressive disorder, single episode, unspecified: Secondary | ICD-10-CM | POA: Diagnosis not present

## 2023-02-03 DIAGNOSIS — Z8673 Personal history of transient ischemic attack (TIA), and cerebral infarction without residual deficits: Secondary | ICD-10-CM | POA: Diagnosis not present

## 2023-02-03 DIAGNOSIS — F0150 Vascular dementia without behavioral disturbance: Secondary | ICD-10-CM | POA: Diagnosis not present

## 2023-02-03 DIAGNOSIS — B029 Zoster without complications: Secondary | ICD-10-CM | POA: Diagnosis not present

## 2023-02-03 DIAGNOSIS — I130 Hypertensive heart and chronic kidney disease with heart failure and stage 1 through stage 4 chronic kidney disease, or unspecified chronic kidney disease: Secondary | ICD-10-CM | POA: Diagnosis not present

## 2023-02-03 DIAGNOSIS — R413 Other amnesia: Secondary | ICD-10-CM | POA: Diagnosis not present

## 2023-02-03 DIAGNOSIS — N184 Chronic kidney disease, stage 4 (severe): Secondary | ICD-10-CM | POA: Diagnosis not present

## 2023-02-03 DIAGNOSIS — I10 Essential (primary) hypertension: Secondary | ICD-10-CM | POA: Diagnosis not present

## 2023-02-03 DIAGNOSIS — J42 Unspecified chronic bronchitis: Secondary | ICD-10-CM | POA: Diagnosis not present

## 2023-02-03 DIAGNOSIS — I5032 Chronic diastolic (congestive) heart failure: Secondary | ICD-10-CM | POA: Diagnosis not present

## 2023-02-03 DIAGNOSIS — J4540 Moderate persistent asthma, uncomplicated: Secondary | ICD-10-CM | POA: Diagnosis not present

## 2023-02-03 DIAGNOSIS — I700 Atherosclerosis of aorta: Secondary | ICD-10-CM | POA: Diagnosis not present

## 2023-02-03 DIAGNOSIS — N179 Acute kidney failure, unspecified: Secondary | ICD-10-CM | POA: Diagnosis not present

## 2023-02-03 DIAGNOSIS — R262 Difficulty in walking, not elsewhere classified: Secondary | ICD-10-CM | POA: Diagnosis not present

## 2023-02-03 DIAGNOSIS — K810 Acute cholecystitis: Secondary | ICD-10-CM | POA: Diagnosis not present

## 2023-02-03 DIAGNOSIS — M6281 Muscle weakness (generalized): Secondary | ICD-10-CM | POA: Diagnosis not present

## 2023-02-03 DIAGNOSIS — N2889 Other specified disorders of kidney and ureter: Secondary | ICD-10-CM | POA: Diagnosis not present

## 2023-02-03 DIAGNOSIS — L03313 Cellulitis of chest wall: Secondary | ICD-10-CM | POA: Diagnosis not present

## 2023-02-03 DIAGNOSIS — G894 Chronic pain syndrome: Secondary | ICD-10-CM | POA: Diagnosis not present

## 2023-02-03 DIAGNOSIS — E785 Hyperlipidemia, unspecified: Secondary | ICD-10-CM | POA: Diagnosis not present

## 2023-02-03 DIAGNOSIS — F419 Anxiety disorder, unspecified: Secondary | ICD-10-CM | POA: Diagnosis not present

## 2023-02-03 DIAGNOSIS — M545 Low back pain, unspecified: Secondary | ICD-10-CM | POA: Diagnosis not present

## 2023-02-03 DIAGNOSIS — M62838 Other muscle spasm: Secondary | ICD-10-CM | POA: Diagnosis not present

## 2023-02-03 DIAGNOSIS — E440 Moderate protein-calorie malnutrition: Secondary | ICD-10-CM | POA: Diagnosis not present

## 2023-02-03 MED ORDER — VALACYCLOVIR HCL 1 G PO TABS: 1000.0000 mg | ORAL_TABLET | Freq: Every day | ORAL | Status: AC

## 2023-02-03 MED ORDER — GABAPENTIN 100 MG PO CAPS: 100.0000 mg | ORAL_CAPSULE | Freq: Three times a day (TID) | ORAL | Status: DC

## 2023-02-03 MED ORDER — METOPROLOL TARTRATE 25 MG PO TABS: 25.0000 mg | ORAL_TABLET | Freq: Two times a day (BID) | ORAL | Status: DC

## 2023-02-03 MED ORDER — OXYCODONE HCL 5 MG PO TABS: 5.0000 mg | ORAL_TABLET | Freq: Four times a day (QID) | ORAL | 0 refills | Status: DC | PRN

## 2023-02-03 MED ORDER — PREDNISONE 20 MG PO TABS: 40.0000 mg | ORAL_TABLET | Freq: Every day | ORAL | Status: AC

## 2023-02-03 NOTE — Progress Notes (Signed)
DISCHARGE NOTE SNF [name] to be discharged to Rehab per MD order. Patient verbalized understanding. Diagnosis, treatment, follow up appointments and medications discussed in detail with Fredderick Erb LPN at [facility].   Skin clean, dry and intact without evidence of skin break down, no evidence of skin tears noted. I  Discharge packet assembled. An After Visit Summary (AVS) was printed.  Tresa Endo, RN

## 2023-02-03 NOTE — Progress Notes (Deleted)
Clinical Summary Ms. Verser is a 87 y.o.female  seen today for follow up of the following medical problems.      1. Palpitations/ paroxysmal afib/Tachy brady - seen in ER 01/29/21 with abdominal pain, palpitations. Awoke her from sleep.  - found to be in afib, new diagnosis for her. Self converted in ER - already on coumadin - 11/2018 echo LVEF 60-65%, mild LAE, mild MR -has been on lopressor 25mg  bid for a number of years   07/2021 ER visit with chest pain, palpitatins - EMS eval HRs 180s, found to be in afib with RVR - given IV diltiazem 15mg  and then drip, converted to SR - trop up to 160 thought to be rate related. 10/2020 nuclear stress no ischemia   - in general 2-3 times per week, usually 5 minutes - episode in March was more severe than normal episodes. +SOB   - seen by EP, started on flecandie 08/2021  - occasional palpitations, lasts just a few minutes - symptosm better with flecanide.    03/2022 monitor: no significant arrhythmias - 12/2022 admission dizziness and bradycardia, flecanide stopped  - s/p pacemaker implant 01/2023        2. CVA/TIAs       3. Leg edema - recent leg swelling over summertime.  - some recent SOB though difficult for her to assess given her asthma - chronically sleeps at angle due to prior back injury - compliant with lasix 40mg  daily, will take additioanl 20mg  as needed    02/2022 echo: LVEF 60-65%, no WMAs, grade III dd, normal RV function, mild MR/MS   - Jan 2024 Cr 1.5. Was 1.7 in December. Last year 07/2021 Cr 1.1   4. Chest tightness  10/2020 nuclear stress no ischemia     - midchest discomfort, pressure 5/10 in severity - no recent episodes, random pattern.  Past Medical History:  Diagnosis Date   Adenomatous colon polyp 2008   Anxiety    Asthma    has not needed in over a year   Complication of anesthesia    pt reports TIA and sezures after surgery   Coronary artery disease    CVA (cerebral vascular accident)  (HCC)    x 3   Degenerative disc disease, lumbar    Depression    Diverticulosis    Dysrhythmia    GERD (gastroesophageal reflux disease)    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30   age 56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 2 weeks   Hemorrhoids 2007   History of kidney stones    HTN (hypertension)    Hyperplastic colon polyp 2005   Hypothyroidism    Narcolepsy    PONV (postoperative nausea and vomiting)    TIA (transient ischemic attack)      Allergies  Allergen Reactions   Epinephrine Anaphylaxis   Demerol [Meperidine] Other (See Comments)    headaches   Morphine Sulfate Anxiety    Patient states it messes with her mind makes her narcoleptic   Vicodin [Hydrocodone-Acetaminophen] Other (See Comments)    York Spaniel it makes her crazy. Pt tolerates acetaminophen     No current facility-administered medications for this visit.   No current outpatient medications on file.   Facility-Administered Medications Ordered in Other Visits  Medication Dose Route Frequency Provider Last Rate Last Admin   0.9 %  sodium chloride infusion  250 mL Intravenous PRN Marinus Maw, MD       acetaminophen (TYLENOL) tablet 325-650 mg  325-650  mg Oral Q4H PRN Calton Dach I, RPH   650 mg at 02/01/23 1541   albuterol (PROVENTIL) (2.5 MG/3ML) 0.083% nebulizer solution 3 mL  3 mL Inhalation Q2H PRN Zigmund Daniel., MD       amLODipine (NORVASC) tablet 10 mg  10 mg Oral Daily Marinus Maw, MD   10 mg at 02/02/23 1037   bisacodyl (DULCOLAX) suppository 10 mg  10 mg Rectal Daily PRN Marinus Maw, MD   10 mg at 01/29/23 0411   diclofenac Sodium (VOLTAREN) 1 % topical gel 2 g  2 g Topical QID Zigmund Daniel., MD   2 g at 02/02/23 2110   ezetimibe (ZETIA) tablet 10 mg  10 mg Oral Daily Marinus Maw, MD   10 mg at 02/02/23 1037   ferrous gluconate (FERGON) tablet 324 mg  324 mg Oral Daily Marinus Maw, MD   324 mg at 02/02/23 1038   gabapentin (NEURONTIN) capsule 100 mg  100 mg Oral TID Zigmund Daniel.,  MD   100 mg at 02/02/23 2108   ipratropium-albuterol (DUONEB) 0.5-2.5 (3) MG/3ML nebulizer solution 3 mL  3 mL Nebulization Q6H WA Zigmund Daniel., MD   3 mL at 02/03/23 0225   levothyroxine (SYNTHROID) tablet 75 mcg  75 mcg Oral QAC breakfast Marinus Maw, MD   75 mcg at 02/03/23 0509   memantine (NAMENDA) tablet 10 mg  10 mg Oral BID Marinus Maw, MD   10 mg at 02/02/23 2108   methocarbamol (ROBAXIN) tablet 500 mg  500 mg Oral Q6H PRN Marinus Maw, MD   500 mg at 02/03/23 0524   ondansetron (ZOFRAN) tablet 4 mg  4 mg Oral Q6H PRN Marinus Maw, MD       Or   ondansetron [facility]) injection 4 mg  4 mg Intravenous Q6H PRN Marinus Maw, MD       oxyCODONE (Oxy IR/ROXICODONE) immediate release tablet 5 mg  5 mg Oral Q6H PRN Marinus Maw, MD   5 mg at 02/03/23 0509   pantoprazole (PROTONIX) EC tablet 40 mg  40 mg Oral Daily Marinus Maw, MD   40 mg at 02/02/23 1038   polyethylene glycol (MIRALAX / GLYCOLAX) packet 17 g  17 g Oral BID Marinus Maw, MD   17 g at 02/02/23 2109   predniSONE (DELTASONE) tablet 40 mg  40 mg Oral Q breakfast Zigmund Daniel., MD   40 mg at 02/02/23 1505   senna-docusate (Senokot-S) tablet 1 tablet  1 tablet Oral QHS PRN Marinus Maw, MD   1 tablet at 01/29/23 0248   simethicone (MYLICON) chewable tablet 80 mg  80 mg Oral QID PRN Gery Pray, MD   80 mg at 01/29/23 0244   sodium chloride flush (NS) 0.9 % injection 3 mL  3 mL Intravenous Q12H Marinus Maw, MD   3 mL at 02/02/23 2108   sodium chloride flush (NS) 0.9 % injection 3 mL  3 mL Intravenous PRN Marinus Maw, MD   3 mL at 01/31/23 2230   valACYclovir (VALTREX) tablet 1,000 mg  1,000 mg Oral Daily Zigmund Daniel., MD   1,000 mg at 02/02/23 1045     Past Surgical History:  Procedure Laterality Date   ABDOMINAL HYSTERECTOMY     age 63, complicated by poor wound healing, followed by revision of scar and radiation for ?malignancy   BACK SURGERY  07/2018   lost 2  inches   BREAST ENHANCEMENT SURGERY     BREAST IMPLANT EXCHANGE Right 06/23/2016   Procedure: RIGHT BREAST IMPLANT REMOVAL AND REPLACEMENT;  Surgeon: Louisa Second, MD;  Location: [facility];  Service: Plastics;  Laterality: Right;   COLONOSCOPY  2005   2 hyperplastic polyps   COLONOSCOPY  2007   Dr. Darrick Penna- hemorrhoids   COLONOSCOPY  2008   Dr. Steva Ready polyp- rare sigmoid diverticulosis, internal hemorrhoids   COLONOSCOPY  12/01/2011   Procedure: COLONOSCOPY;  Surgeon: West Bali, MD;  Location: [facility];  Service: Endoscopy;  Laterality: N/A;  12:30 PM   COLONOSCOPY WITH PROPOFOL N/A 05/22/2022   Procedure: COLONOSCOPY WITH PROPOFOL;  Surgeon: Lanelle Bal, DO;  Location: [facility];  Service: Endoscopy;  Laterality: N/A;  1:00 PM   CYSTOSCOPY/URETEROSCOPY/HOLMIUM LASER/STENT PLACEMENT Left 02/29/2020   Procedure: CYSTOSCOPY/URETEROSCOPY/HOLMIUM LASER/STENT PLACEMENT;  Surgeon: Rene Paci, MD;  Location: [facility];  Service: Urology;  Laterality: Left;   epidural steroid injection     She is getting injections with Dr. Ethelene Hal q3wks.   ESOPHAGOGASTRODUODENOSCOPY (EGD) WITH ESOPHAGEAL DILATION N/A 04/15/2013   Procedure: ESOPHAGOGASTRODUODENOSCOPY (EGD) WITH ESOPHAGEAL DILATION;  Surgeon: West Bali, MD;  Location: [facility];  Service: Endoscopy;  Laterality: N/A;  11:45-moved to 12:30 [name] to notify pt   EYE SURGERY  2010   EYE SURGERY     IR KYPHO LUMBAR INC FX REDUCE BONE BX UNI/BIL CANNULATION INC/IMAGING  08/23/2018   IR RADIOLOGIST EVAL & MGMT  12/23/2018   PACEMAKER IMPLANT N/A 01/28/2023   Procedure: PACEMAKER IMPLANT;  Surgeon: Marinus Maw, MD;  Location: [facility];  Service: Cardiovascular;  Laterality: N/A;   POLYPECTOMY  05/22/2022   Procedure: POLYPECTOMY;  Surgeon: Lanelle Bal, DO;  Location: [facility];  Service: Endoscopy;;   right thumb surgery       Allergies  Allergen Reactions    Epinephrine Anaphylaxis   Demerol [Meperidine] Other (See Comments)    headaches   Morphine Sulfate Anxiety    Patient states it messes with her mind makes her narcoleptic   Vicodin [Hydrocodone-Acetaminophen] Other (See Comments)    York Spaniel it makes her crazy. Pt tolerates acetaminophen      Family History  Problem Relation Age of Onset   Colon cancer Father        age 35   Prostate cancer Father    Pancreatic cancer Mother        age 68     Social History Ms. Arena reports that she has never smoked. She has never been exposed to tobacco smoke. She has never used smokeless tobacco. Ms. Deleeuw reports no history of alcohol use.   Review of Systems CONSTITUTIONAL: No weight loss, fever, chills, weakness or fatigue.  HEENT: Eyes: No visual loss, blurred vision, double vision or yellow sclerae.No hearing loss, sneezing, congestion, runny nose or sore throat.  SKIN: No rash or itching.  CARDIOVASCULAR:  RESPIRATORY: No shortness of breath, cough or sputum.  GASTROINTESTINAL: No anorexia, nausea, vomiting or diarrhea. No abdominal pain or blood.  GENITOURINARY: No burning on urination, no polyuria NEUROLOGICAL: No headache, dizziness, syncope, paralysis, ataxia, numbness or tingling in the extremities. No change in bowel or bladder control.  MUSCULOSKELETAL: No muscle, back pain, joint pain or stiffness.  LYMPHATICS: No enlarged nodes. No history of splenectomy.  PSYCHIATRIC: No history of depression or anxiety.  ENDOCRINOLOGIC: No reports of sweating, cold or  heat intolerance. No polyuria or polydipsia.  Marland Kitchen   Physical Examination There were no vitals filed for this visit. There were no vitals filed for this visit.  Gen: resting comfortably, no acute distress HEENT: no scleral icterus, pupils equal round and reactive, no palptable cervical adenopathy,  CV Resp: Clear to auscultation bilaterally GI: abdomen is soft, non-tender, non-distended, normal bowel sounds, no  hepatosplenomegaly MSK: extremities are warm, no edema.  Skin: warm, no rash Neuro:  no focal deficits Psych: appropriate affect   Diagnostic Studies 2008 CORONARY ANGIOGRAPHY:  Left mainstem is angiographically normal.  It  bifurcates into the LAD and left circumflex.  The left main stem is  short.    The LAD is a medium caliber vessel in its proximal portion.  It gives  off a large first diagonal Jourden Delmont that is actually bigger than the mid  and distal true LAD.  The LAD in its midportion has a long tubular 40%  stenosis.  The vessel beyond this is small diameter.  The first diagonal  Dannisha Eckmann, which again is a large vessel and almost serves as a twin LAD  system, is angiographically without significant disease.    The left circumflex has nonobstructive plaque in its ostium that appears  no worse than 20-25%.  The circumflex gives off a first obtuse marginal  [place] which is tortuous but has no significant angiographic disease.  The remainder of the circumflex is small.    The right coronary artery is dominant.  It is angiographically normal  throughout its proximal and midportions.  The distal vessel has  nonobstructive plaque.  It gives off a large posterior descending Aubert Choyce  which is angiographically normal.  It gives off a medium size  posterolateral Felton Buczynski.    Left ventricular function assessed in the 30 degrees right anterior  oblique projection demonstrates normal left ventricular function with a  left ventricular ejection fraction of 65%.  There is no mitral  regurgitation.    ASSESSMENT:  1. Nonobstructive coronary artery disease.  2. Normal left ventricular function.    RECOMMENDATIONS:  The patient should continue with medical therapy for  coronary artery disease.  Will plan on discharge later today if there  are no other significant issues that come up.     Jan 2019 nuclear stress   There was no ST segment deviation noted during stress. The study is normal.  No ischemia or scar. This is a low risk study. Nuclear stress EF: 75%.       Jan 2019 echo Study Conclusions   - Left ventricle: The cavity size was normal. Wall thickness was    increased in a pattern of mild LVH. Systolic function was normal.    The estimated ejection fraction was in the range of 60% to 65%.    Wall motion was normal; there were no regional wall motion    abnormalities. The study is not technically sufficient to allow    evaluation of LV diastolic function.  - Mitral valve: Mildly calcified annulus. There was trivial    regurgitation.  - Right atrium: Central venous pressure (est): 3 mm Hg.  - Atrial septum: No defect or patent foramen ovale was identified.  - Tricuspid valve: There was mild regurgitation.  - Pulmonary arteries: PA peak pressure: 36 mm Hg (S).  - Pericardium, extracardiac: There was no pericardial effusion.    11/2018 carotid US IMPRESSION: 1. Bilateral carotid bifurcation plaque resulting in less than 50% diameter ICA stenosis. 2. Antegrade bilateral vertebral  arterial flow.   11/2018 echo IMPRESSIONS     1. The left ventricle has normal systolic function with an ejection  fraction of 60-65%. The cavity size was normal. There is mildly increased  left ventricular wall thickness. Left ventricular diastolic Doppler  parameters are consistent with  pseudonormalization.   2. The right ventricle has normal systolic function. The cavity was  normal. There is no increase in right ventricular wall thickness. Right  ventricular systolic pressure is normal with an estimated pressure of 28.4  mmHg.   3. Left atrial size was mildly dilated.   4. The mitral valve is grossly normal. Mild calcification of the mitral  valve leaflet. There is moderate mitral annular calcification present.  There is mild mitral regurgitation.   5. The tricuspid valve is grossly normal.   6. The aortic valve has an indeterminate number of cusps. Moderate  calcification  of the aortic valve.   7. The aorta is normal in size and structure.    10/2019 nuclear stress Lexiscan stress is electrically negative for ischemia Myoview scan shows normal perfusion No ischemia or scar LVEF calculated at 88% Low risk study   02/2022 echo 1. Left ventricular ejection fraction, by estimation, is 60 to 65%. The  left ventricle has normal function. The left ventricle has no regional  wall motion abnormalities. There is mild left ventricular hypertrophy.  Left ventricular diastolic parameters  are consistent with Grade III diastolic dysfunction (restrictive).   2. Right ventricular systolic function is normal. The right ventricular  size is normal.   3. Left atrial size was mildly dilated.   4. Mitral Valve Area (MVA) = 1.76 cm2. The mitral valve is degenerative.  Mild mitral valve regurgitation. Mild mitral stenosis. Moderate mitral  annular calcification.   5. The aortic valve was not assessed. Aortic valve regurgitation is not  visualized.   01/2023 echo 1. Left ventricular ejection fraction, by estimation, is 60 to 65%. The  left ventricle has normal function. The left ventricle has no regional  wall motion abnormalities. Left ventricular diastolic parameters are  consistent with Grade II diastolic  dysfunction (pseudonormalization).   2. Right ventricular systolic function is normal. The right ventricular  size is normal. There is mildly elevated pulmonary artery systolic  pressure. The estimated right ventricular systolic pressure is 43.1 mmHg.   3. Left atrial size was moderately dilated.   4. The mitral valve is normal in structure. Mild mitral valve  regurgitation. No evidence of mitral stenosis.   5. The aortic valve is calcified. There is mild calcification of the  aortic valve. Aortic valve regurgitation is trivial. Aortic valve  sclerosis/calcification is present, without any evidence of aortic  stenosis.   6. The inferior vena cava is dilated in size  with <50% respiratory  variability, suggesting right atrial pressure of 15 mmHg.     Assessment and Plan   1.PAF - symptoms improved on flecanide though not completely resolved - doing well, continue current meds   2. HFpEF - appears euvolemic today. Uptrend in Cr since we changed lasix to 60mg  daily, will lower to 60mg  alternating days with 40mg    3. HTN -at goal on manual recheck, conitnue current meds     Antoine Poche, M.D., F.A.C.C.

## 2023-02-03 NOTE — Discharge Summary (Signed)
Physician Discharge Summary  [name] ZOX:[id number] DOB: 1935/08/21 DOA: 01/25/2023  PCP: Benita Stabile, MD  Admit date: 01/25/2023 Discharge date: 02/03/2023  Admitted From: Home Disposition:  [facility]  Recommendations for Outpatient Follow-up:  Follow up with PCP in 1-2 weeks Outpatient follow-up with neurology Outpatient follow-up with plastic surgery for concern of leaking of breast implant. Consider outpatient GI referral if continues to complain of persistent abdominal pain for consideration of EGD Please obtain BMP/CBC in one week Please follow up on the following pending results:  Discharge Condition: Stable CODE STATUS: Full code Diet recommendation: Heart healthy diet  History of present illness:  [name] is a 87 y.o. female with past medical history significant for CKD stage IIIb, HTN, paroxysmal atrial fibrillation on Eliquis, hypothyroidism, hyperlipidemia, history of CVA/TIA, chronic anemia who presented to [facility] ED on 9/1 with complaint of abdominal pain.  Initial suspicion for cholecystitis, general surgery was consulted but HIDA scan was negative and surgery signed off as no indication for cholecystectomy at this time.  Hospitalization complicated by bradycardia and underwent PPM placement.  Also patient with right sided ataxia with CT head negative x 2 (unable to perform MRI due to recent pacemaker placement).  Additionally patient complaining of a rash/pain under her right breast and was treated empirically for suspected shingles.  Discharging to SNF.   Hospital course:  Abdominal pain Patient presenting with abdominal pain.  CT abdomen/pelvis with pericholecystic fluid, right upper quadrant ultrasound with small gallstone, 9 mm gallbladder wall polyp and mild gallbladder wall thickening.  General surgery was consulted and followed during hospital course.  HIDA scan without, duct or cystic duct obstruction.  Given negative HIDA scan, general  surgery signed off as no indication for cholecystectomy at this point.  Patient also notes being constipated recently, may be contributing factor to her discomfort.  Continue to monitor bowel movements and utilize as needed stool softeners.  Consider outpatient follow-up with gastroenterology.  Rash right flank concerning for shingles Patient complaining of rash under her right breast.  No active pustules/lesions, although appears to be in a dermatomal pattern.  Patient was started on Valtrex for suspected shingles, recommend continue to complete 7-day course.  Continue gabapentin x 14 days.  Right breast concern for rupture implant capsule CT chest notable for linear folds versus rupture right breast implant capsule.  Denies pain to the breast area.  Patient is afebrile without leukocytosis.  Recommend outpatient follow-up with plastic surgery for further evaluation and management.  Hx CVA New right-sided ataxia Patient initially refused MRI on admission due to back pain.  CT head without contrast with no acute intracranial abnormality.  Repeat CT head on 9/6 stable.  Patient reports symptoms of ataxia for a few days unclear exactly when started.  TTE with LVEF 60 to 65%, no regional wall motion abnormalities, grade 2 diastolic dysfunction.  Hemoglobin A1c 5.6, LDL 78.  Unfortunately patient required pacemaker placement and unable to further obtain MRI due to recent placement.  Continue Eliquis.  Outpatient follow-up with neurology.  Tachybradycardia syndrome Symptomatic bradycardia Hx paroxysmal atrial fibrillation Patient was seen by cardiology and underwent pacemaker placement on 01/28/2023.  TTE with LVEF 60 to 65%, no regional wall motion abnormalities, grade 2 diastolic dysfunction.  Continue Eliquis.  Discontinue flecainide per cardiology.  Outpatient follow-up with cardiology.  Normocytic anemia Hemoglobin 10.4 at time of discharge.  Iron, B12, folate within normal limits.  Continue to  monitor outpatient.  Acute renal failure on CKD  stage IIIb Patient presenting with a creatinine of 2.53.  Baseline creatinine 1.3-1.9.  Improved with IV fluid hydration.  Creatinine 1.37 at time of discharge.  Continue to follow outpatient.  Hypothyroidism Levothyroxine 75 mcg p.o. daily  Hyperlipidemia Ezetimibe 10 mg p.o. daily  Cognitive impairment Continue memantine  Chronic low back pain, right hip pain Continue oxycodone as needed  Essential hypertension Continue amlodipine 10 mg p.o. daily, metoprolol 25 g p.o. twice daily.  Furosemide 40 mg p.o. daily.  Discontinued home losartan for now.  Continue monitor BP and adjust as needed.  Morbid obesity Body mass index is 31.35 kg/m.  Complicates all facets of care.   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    Discharge Instructions  Discharge Instructions     Ambulatory referral to Neurology   Complete by: As directed    An appointment is requested in approximately: 2 weeks   Diet - low sodium heart healthy   Complete by: As directed    Increase activity slowly   Complete by: As directed       Allergies as of 02/03/2023       Reactions   Epinephrine Anaphylaxis   Demerol [meperidine] Other (See Comments)   headaches   Morphine Sulfate Anxiety   Patient states it messes with her mind makes her narcoleptic   Vicodin [hydrocodone-acetaminophen] Other (See Comments)   York Spaniel it makes her crazy. Pt tolerates acetaminophen        Medication List     STOP taking these medications    flecainide 50 MG tablet Commonly known as: TAMBOCOR   lidocaine 5 % Commonly known as: Lidoderm    losartan 25 MG tablet Commonly known as: COZAAR   Magnesium 250 MG Tabs   senna-docusate 8.6-50 MG tablet Commonly known as: Senokot-S       TAKE these medications    albuterol 108 (90 Base) MCG/ACT inhaler Commonly known as: VENTOLIN HFA Inhale 2 puffs into the lungs every 6 (six) hours as needed for wheezing or shortness of breath.   amLODipine 10 MG tablet Commonly known as: NORVASC Take 1 tablet (10 mg total) by mouth daily. For BP   apixaban 2.5 MG Tabs tablet Commonly known as: Eliquis Take 1 tablet (2.5 mg total) by mouth 2 (two) times daily. Notes to patient: Resume on 02/03/23   CALCIUM 600 + D PO Take 1 tablet by mouth in the morning and at bedtime.   ezetimibe 10 MG tablet Commonly known as: ZETIA Take 1 tablet (10 mg total) by mouth daily.   Fish Oil 1200 MG Caps Take 1,200 mg by mouth daily.   furosemide 40 MG tablet Commonly known as: LASIX Take 1 tablet (40 mg total) by mouth daily.   gabapentin 100 MG capsule Commonly known as: NEURONTIN Take 1 capsule (100 mg total) by mouth 3 (three) times daily for 14 days.   Iron 240 (27 Fe) MG Tabs Take 1 tablet by mouth daily.   levothyroxine 75 MCG tablet Commonly known as: SYNTHROID Take 75 mcg by mouth daily before breakfast.   memantine 10 MG tablet Commonly known as: Namenda Take 1 tablet (10 mg total) by  mouth 2 (two) times daily.   metoprolol tartrate 25 MG tablet Commonly known as: LOPRESSOR Take 1 tablet (25 mg total) by mouth 2 (two) times daily.   oxyCODONE 5 MG immediate release tablet Commonly known as: Oxy IR/ROXICODONE Take 1 tablet (5 mg total) by mouth every 6 (six) hours as needed for severe pain.   pantoprazole 40 MG tablet Commonly known as: PROTONIX Take 1 tablet (40 mg total) by mouth daily.   Potassium Chloride ER 20 MEQ Tbcr Take 1 tablet (20 mEq total) by mouth daily. 1 tab daily by mouth--- take while taking Lasix/furosemide   predniSONE 20 MG tablet Commonly known  as: DELTASONE Take 2 tablets (40 mg total) by mouth daily with breakfast for 3 days. Start taking on: February 05, 2023   valACYclovir 1000 MG tablet Commonly known as: VALTREX Take 1 tablet (1,000 mg total) by mouth daily for 3 days. Start taking on: February 05, 2023   Vitamin B Complex Tabs Take 1 tablet by mouth daily.   vitamin C 1000 MG tablet Take 1,000 mg by mouth daily.   Vitamin D (Cholecalciferol) 25 MCG (1000 UT) Tabs Take 1,000 Int'l Units/1.35m2 by mouth daily at 6 (six) AM.   vitamin E 180 MG (400 UNITS) capsule Take 400 Units by mouth daily.        Contact information for follow-up providers     Benita Stabile, MD. Schedule an appointment as soon as possible for a visit in 1 week(s).   Specialty: Internal Medicine Contact information: [address] [place] [address] [phone]              Contact information for after-discharge care     Destination     [facility] .   Service: Skilled Nursing Contact information: [address] [place] [address] [phone]                    Allergies  Allergen Reactions   Epinephrine Anaphylaxis   Demerol [Meperidine] Other (See Comments)    headaches   Morphine Sulfate Anxiety    Patient states it messes with her mind makes her narcoleptic   Vicodin [Hydrocodone-Acetaminophen] Other (See Comments)    York Spaniel it makes her crazy. Pt tolerates acetaminophen    Consultations: General Surgery Cardiology/electrophysiology   Procedures/Studies: DG CHEST PORT 1 VIEW  Result Date: 02/02/2023 CLINICAL DATA:  Shortness of breath EXAM: PORTABLE CHEST 1 VIEW COMPARISON:  Chest x-ray 01/29/2023 FINDINGS: Left-sided pacemaker is present. The heart is enlarged, unchanged. There are minimal patchy opacities in the left lung base with small left pleural effusion. Right lung is clear. No pneumothorax. Peripherally calcified left breast implant again  noted. No acute fractures are seen. IMPRESSION: Minimal patchy opacities in the left lung base with small left pleural effusion. Electronically Signed   By: Darliss Cheney M.D.   On: 02/02/2023 22:52   CT CHEST WO CONTRAST  Result Date: 01/30/2023 CLINICAL DATA:  Right chest wall/breast pain. EXAM: CT CHEST WITHOUT CONTRAST TECHNIQUE: Multidetector CT imaging of the chest was performed following the standard protocol without IV contrast. RADIATION DOSE REDUCTION: This exam was performed according to the departmental dose-optimization program which includes automated exposure control, adjustment of the mA and/or kV according to patient size and/or use of iterative reconstruction technique. COMPARISON:  CT of the chest abdomen pelvis dated 01/01/2023. FINDINGS: Evaluation of this exam is limited in the absence of intravenous contrast. Cardiovascular: There is  no cardiomegaly or pericardial effusion. Left pectoral pacemaker device. Coronary vascular calcification noted. Mild atherosclerotic calcification of the thoracic aorta. No aneurysmal dilatation. The central pulmonary arteries are grossly unremarkable on this noncontrast CT. Mediastinum/Nodes: No hilar or mediastinal adenopathy. Evaluation however is limited in the absence of intravenous contrast. The esophagus is grossly unremarkable. No mediastinal fluid collection. Lungs/Pleura: Small bilateral pleural effusions, new since the prior CT. Large areas of consolidation with air bronchogram involving the lung bases bilaterally may represent atelectasis or pneumonia. Clinical correlation is recommended. There is no pneumothorax. The central airways are patent. Upper Abdomen: No acute abnormality. Musculoskeletal: Bilateral breast implants. There is calcification of the left breast implant capsule. Linear folds versus rupture of the right breast implant capsule. There is osteopenia with degenerative changes of the spine. Old T12 compression fracture and  vertebroplasty. No acute osseous pathology. Small pockets of air in the left anterior chest wall related to pacemaker placement. No fluid collection or hematoma. Mild thickening of the skin of the left breast. Clinical correlation is recommended. IMPRESSION: 1. Small bilateral pleural effusions bibasilar atelectasis or pneumonia. 2. Bilateral breast implants. Linear folds versus rupture of the right breast implant capsule. 3.  Aortic Atherosclerosis (ICD10-I70.0). Electronically Signed   By: Elgie Collard M.D.   On: 01/30/2023 19:50   CT HEAD WO CONTRAST ( )  Result Date: 01/30/2023 CLINICAL DATA:  Neuro deficit, stroke suspected. EXAM: CT HEAD WITHOUT CONTRAST TECHNIQUE: Contiguous axial images were obtained from the base of the skull through the vertex without intravenous contrast. RADIATION DOSE REDUCTION: This exam was performed according to the departmental dose-optimization program which includes automated exposure control, adjustment of the mA and/or kV according to patient size and/or use of iterative reconstruction technique. COMPARISON:  CT head 1 day prior FINDINGS: Brain: There is no acute intracranial hemorrhage, extra-axial fluid collection. Background parenchymal volume is stable. The ventricles are stable in size. Encephalomalacia in the right anterior frontal and temporal lobes and with a small remote infarct in the right cerebellar hemisphere are unchanged. The calcified left frontal meningioma is unchanged. The pituitary and suprasellar region are normal. There is no mass lesion. There is no mass effect or midline shift. Vascular: There is calcification of the bilateral carotid siphons. Skull: Normal. Negative for fracture or focal lesion. Sinuses/Orbits: The paranasal sinuses are clear. Bilateral lens implants are in place. The globes and orbits are otherwise unremarkable. Other: The mastoid air cells and middle ear cavities are clear. IMPRESSION: Stable noncontrast head CT with no acute  intracranial pathology. Electronically Signed   By: Lesia Hausen M.D.   On: 01/30/2023 14:37   CT HEAD WO CONTRAST ( )  Result Date: 01/29/2023 CLINICAL DATA:  Neuro deficit, acute, stroke suspected EXAM: CT HEAD WITHOUT CONTRAST TECHNIQUE: Contiguous axial images were obtained from the base of the skull through the vertex without intravenous contrast. RADIATION DOSE REDUCTION: This exam was performed according to the departmental dose-optimization program which includes automated exposure control, adjustment of the mA and/or kV according to patient size and/or use of iterative reconstruction technique. COMPARISON:  None Available. FINDINGS: Brain: No evidence of acute infarction, hemorrhage, hydrocephalus, extra-axial collection or mass lesion/mass effect. Unchanged encephalomalacia in the inferomedial right frontal lobe. Unchanged calcified meningioma along the left frontal convexity. Chronic right cerebellar infarct. Vascular: No hyperdense vessel or unexpected calcification. Skull: Normal. Negative for fracture or focal lesion. Sinuses/Orbits: No middle ear or mastoid effusion. Sinuses are notable for mucosal thickening of the floor of bilateral maxillary sinuses. Bilateral lens replacement.  Orbits are otherwise unremarkable. Other: None. IMPRESSION: 1. No acute intracranial abnormality. 2. Unchanged encephalomalacia in the inferomedial right frontal lobe. 3. Unchanged calcified meningioma along the left frontal convexity. Electronically Signed   By: Lorenza Cambridge M.D.   On: 01/29/2023 13:17   DG Chest 2 View  Result Date: 01/29/2023 CLINICAL DATA:  Pacemaker placement. EXAM: CHEST - 2 VIEW COMPARISON:  January 25, 2023. FINDINGS: Stable cardiomediastinal silhouette. Left-sided pacemaker is noted with leads in grossly good position. No pneumothorax is noted. Right lung is clear. Small left pleural effusion is noted with associated left basilar atelectasis or infiltrate. Bony thorax is unremarkable.  IMPRESSION: Left-sided pacemaker is noted in grossly good position. Small left pleural effusion is noted with associated left basilar atelectasis or infiltrate. Electronically Signed   By: Lupita Raider M.D.   On: 01/29/2023 07:59   EP PPM/ICD IMPLANT  Result Date: 01/28/2023 CONCLUSIONS:  1. Successful implantation of a [employer]tronic dual-chamber pacemaker for symptomatic bradycardia due to tachy-brady syndrome and 6 second pauses.  2. No early apparent complications.       Lewayne Bunting, MD 01/28/2023 1:23 PM   NM Hepatobiliary Liver Func  Result Date: 01/27/2023 CLINICAL DATA:  Cholelithiasis EXAM: NUCLEAR MEDICINE HEPATOBILIARY IMAGING TECHNIQUE: Sequential images of the abdomen were obtained out to 60 minutes following intravenous administration of radiopharmaceutical. RADIOPHARMACEUTICALS:  5.3 mCi Tc-55m  Choletec IV COMPARISON:  Ultrasound and CT 01/26/2023 FINDINGS: Prompt uptake and biliary excretion of activity by the liver is seen. Gallbladder activity is visualized, consistent with patency of cystic duct. Biliary activity passes into small bowel, consistent with patent common bile duct. IMPRESSION: No common duct or cystic duct obstruction. Electronically Signed   By: Karen Kays M.D.   On: 01/27/2023 15:52   ECHOCARDIOGRAM COMPLETE  Result Date: 01/27/2023    ECHOCARDIOGRAM REPORT   Patient Name:   OZELL LEREW [facility] Date of Exam: 01/27/2023 Medical Rec #:  [id number]            Height:       59.0 in Accession #:    [id number]           Weight:       138.7 lb Date of Birth:  1935/07/26            BSA:          1.579 m Patient Age:    87 years             BP:           126/57 mmHg Patient Gender: F                    HR:           65 bpm. Exam Location:  Inpatient Procedure: 2D Echo, Cardiac Doppler, Color Doppler and Strain Analysis Indications:    Bradycardia  History:        Patient has prior history of Echocardiogram examinations, most                 recent 03/07/2022. CHF, CAD, Stroke and TIA,  Arrythmias:Atrial                 Fibrillation, Signs/Symptoms:Chest Pain; Risk                 Factors:Hypertension and Dyslipidemia. CKD, stage 4                 Brast cancer.  Sonographer:    Lucendia Herrlich  Referring Phys: [id number] SUBRINA SUNDIL  Sonographer Comments: Image acquisition challenging due to patient body habitus and Image acquisition challenging due to respiratory motion. IMPRESSIONS  1. Left ventricular ejection fraction, by estimation, is 60 to 65%. The left ventricle has normal function. The left ventricle has no regional wall motion abnormalities. Left ventricular diastolic parameters are consistent with Grade II diastolic dysfunction (pseudonormalization).  2. Right ventricular systolic function is normal. The right ventricular size is normal. There is mildly elevated pulmonary artery systolic pressure. The estimated right ventricular systolic pressure is 43.1 mmHg.  3. Left atrial size was moderately dilated.  4. The mitral valve is normal in structure. Mild mitral valve regurgitation. No evidence of mitral stenosis.  5. The aortic valve is calcified. There is mild calcification of the aortic valve. Aortic valve regurgitation is trivial. Aortic valve sclerosis/calcification is present, without any evidence of aortic stenosis.  6. The inferior vena cava is dilated in size with <50% respiratory variability, suggesting right atrial pressure of 15 mmHg. Comparison(s): No significant change from prior study. FINDINGS  Left Ventricle: Left ventricular ejection fraction, by estimation, is 60 to 65%. The left ventricle has normal function. The left ventricle has no regional wall motion abnormalities. The left ventricular internal cavity size was normal in size. There is  no left ventricular hypertrophy. Left ventricular diastolic parameters are consistent with Grade II diastolic dysfunction (pseudonormalization). Right Ventricle: The right ventricular size is normal. No increase in right ventricular  wall thickness. Right ventricular systolic function is normal. There is mildly elevated pulmonary artery systolic pressure. The tricuspid regurgitant velocity is 2.65  m/s, and with an assumed right atrial pressure of 15 mmHg, the estimated right ventricular systolic pressure is 43.1 mmHg. Left Atrium: Left atrial size was moderately dilated. Right Atrium: Right atrial size was normal in size. Pericardium: There is no evidence of pericardial effusion. Mitral Valve: The mitral valve is normal in structure. Mild to moderate mitral annular calcification. Mild mitral valve regurgitation, with centrally-directed jet. No evidence of mitral valve stenosis. MV peak gradient, 13.8 mmHg. The mean mitral valve gradient is 3.0 mmHg. Tricuspid Valve: The tricuspid valve is normal in structure. Tricuspid valve regurgitation is trivial. Aortic Valve: The aortic valve is calcified. There is mild calcification of the aortic valve. Aortic valve regurgitation is trivial. Aortic valve sclerosis/calcification is present, without any evidence of aortic stenosis. Aortic valve mean gradient measures 7.5 mmHg. Aortic valve peak gradient measures 12.9 mmHg. Aortic valve area, by VTI measures 1.93 cm. Pulmonic Valve: The pulmonic valve was not well visualized. Pulmonic valve regurgitation is not visualized. No evidence of pulmonic stenosis. Aorta: The aortic root and ascending aorta are structurally normal, with no evidence of dilitation. Venous: The inferior vena cava is dilated in size with less than 50% respiratory variability, suggesting right atrial pressure of 15 mmHg. IAS/Shunts: No atrial level shunt detected by color flow Doppler.  LEFT VENTRICLE PLAX 2D LVIDd:         4.00 cm   Diastology LVIDs:         2.50 cm   LV e' medial:    7.62 cm/s LV PW:         1.10 cm   LV E/e' medial:  19.6 LV IVS:        1.00 cm   LV e' lateral:   8.70 cm/s LVOT diam:     1.90 cm   LV E/e' lateral: 17.1 LV SV:         85 LV  SV Index:   54        2D  Longitudinal Strain LVOT Area:     2.84 cm  2D Strain GLS Avg:     -16.3 %  RIGHT VENTRICLE             IVC RV S prime:     11.30 cm/s  IVC diam: 2.10 cm TAPSE (M-mode): 2.0 cm LEFT ATRIUM             Index        RIGHT ATRIUM           Index LA diam:        4.10 cm 2.60 cm/m   RA Area:     18.90 cm LA Vol (A2C):   67.5 ml 42.76 ml/m  RA Volume:   50.10 ml  31.74 ml/m LA Vol (A4C):   64.4 ml 40.80 ml/m LA Biplane Vol: 68.6 ml 43.46 ml/m  AORTIC VALVE AV Area (Vmax):    2.03 cm AV Area (Vmean):   2.02 cm AV Area (VTI):     1.93 cm AV Vmax:           179.50 cm/s AV Vmean:          125.000 cm/s AV VTI:            0.442 m AV Peak Grad:      12.9 mmHg AV Mean Grad:      7.5 mmHg LVOT Vmax:         128.33 cm/s LVOT Vmean:        88.867 cm/s LVOT VTI:          0.301 m LVOT/AV VTI ratio: 0.68  AORTA Ao Root diam: 2.90 cm Ao Asc diam:  3.40 cm MITRAL VALVE                TRICUSPID VALVE MV Area (PHT): 4.06 cm     TR Peak grad:   28.1 mmHg MV Area VTI:   1.87 cm     TR Vmax:        265.00 cm/s MV Peak grad:  13.8 mmHg MV Mean grad:  3.0 mmHg     SHUNTS MV Vmax:       1.86 m/s     Systemic VTI:  0.30 m MV Vmean:      77.6 cm/s    Systemic Diam: 1.90 cm MV Decel Time: 187 msec MR Peak grad: 51.8 mmHg MR Vmax:      360.00 cm/s MV E velocity: 149.00 cm/s MV A velocity: 71.80 cm/s MV E/A ratio:  2.08 Mihai Croitoru MD Electronically signed by Thurmon Fair MD Signature Date/Time: 01/27/2023/3:34:33 PM    Final    US Abdomen Limited RUQ (LIVER/GB)  Result Date: 01/26/2023 CLINICAL DATA:  Right upper quadrant pain EXAM: ULTRASOUND ABDOMEN LIMITED RIGHT UPPER QUADRANT COMPARISON:  CT earlier today FINDINGS: Gallbladder: 6 mm stone in the gallbladder neck region. 9 mm polyp noted. Mild gallbladder wall thickening at 4 mm. Common bile duct: Diameter: Upper limits normal in size for patient's age measuring up to 7-8 mm. Liver: No focal lesion identified. Within normal limits in parenchymal echogenicity. Portal vein is  patent on color Doppler imaging with normal direction of blood flow towards the liver. Other: None. IMPRESSION: Small gallstone. 9 mm gallbladder wall polyp. Mild gallbladder wall thickening. Recommend clinical correlation for possible early cholecystitis. Electronically Signed   By: Charlett Nose M.D.   On: 01/26/2023 03:11   CT  ABDOMEN PELVIS WO CONTRAST  Result Date: 01/26/2023 CLINICAL DATA:  Abdominal pain, acute, to the right flank EXAM: CT ABDOMEN AND PELVIS WITHOUT CONTRAST CT LUMBAR SPINE WITHOUT CONTRAST TECHNIQUE: Multidetector CT imaging of the abdomen and pelvis was performed following the standard protocol without IV contrast. Multidetector CT imaging of the lumbar spine was performed without intravenous contrast administration. Multiplanar CT image reconstructions were also generated. RADIATION DOSE REDUCTION: This exam was performed according to the departmental dose-optimization program which includes automated exposure control, adjustment of the mA and/or kV according to patient size and/or use of iterative reconstruction technique. COMPARISON:  CT abdomen and pelvis 01/01/2023 FINDINGS: ABDOMEN/PELVIS: Lower chest: Cardiomegaly. Interlobular septal thickening in the lower lungs. Trace left effusion. Hepatobiliary: Small amount of pericholecystic fluid, slightly increased from prior. No radiopaque stone. Stable caliber biliary ducts. Scattered hepatic cysts. Pancreas: Unremarkable. Spleen: Unremarkable. Adrenals/Urinary Tract: Stable adrenal glands. Unchanged left nephrolithiasis. No obstructing ureteral calculi. No hydronephrosis. Interpolar right kidney 1.8 cm hyperdense hemorrhagic or proteinaceous cyst. No specific follow-up imaging recommended. Unremarkable bladder. Stomach/Bowel: Normal caliber large and small bowel. No bowel wall thickening. Stomach is within normal limits. The appendix is not visualized. Vascular/Lymphatic: Aortic atherosclerosis. No enlarged abdominal or pelvic lymph  nodes. Reproductive: Hysterectomy.  No adnexal mass. Other: No free intraperitoneal fluid or air. Musculoskeletal: No acute fracture. LUMBAR SPINE: Segmentation: 5 lumbar type vertebrae. Alignment: Unchanged alignment from 01/01/2023. No evidence of traumatic listhesis. Vertebrae: No acute fracture. Chronic compression fracture of T12 status post vertebroplasty. Paraspinal and other soft tissues: Reported above. Disc levels: Multilevel spondylosis. Advanced disc space height loss and degenerative endplate changes at L5-S1. Advanced facet arthropathy throughout the lumbar spine. Posterior disc extrusion at L5-S1 causes narrowing of the left lateral recess mild spinal canal narrowing, similar to MRI 08/05/2021. IMPRESSION: 1. Small amount of pericholecystic fluid, slightly increased from prior. No radiopaque stone. If there is clinical concern for acute cholecystitis, consider further evaluation with right upper quadrant ultrasound. 2. Unchanged left nephrolithiasis. No obstructing ureteral calculi. No hydronephrosis. 3. No acute fracture or traumatic listhesis in the lumbar spine. 4. Cardiomegaly with interlobular septal thickening in the lower lungs and trace left effusion, suggestive of pulmonary edema. Aortic Atherosclerosis (ICD10-I70.0). Electronically Signed   By: Minerva Fester M.D.   On: 01/26/2023 01:13   CT L-SPINE NO CHARGE  Result Date: 01/26/2023 CLINICAL DATA:  Abdominal pain, acute, to the right flank EXAM: CT ABDOMEN AND PELVIS WITHOUT CONTRAST CT LUMBAR SPINE WITHOUT CONTRAST TECHNIQUE: Multidetector CT imaging of the abdomen and pelvis was performed following the standard protocol without IV contrast. Multidetector CT imaging of the lumbar spine was performed without intravenous contrast administration. Multiplanar CT image reconstructions were also generated. RADIATION DOSE REDUCTION: This exam was performed according to the departmental dose-optimization program which includes automated  exposure control, adjustment of the mA and/or kV according to patient size and/or use of iterative reconstruction technique. COMPARISON:  CT abdomen and pelvis 01/01/2023 FINDINGS: ABDOMEN/PELVIS: Lower chest: Cardiomegaly. Interlobular septal thickening in the lower lungs. Trace left effusion. Hepatobiliary: Small amount of pericholecystic fluid, slightly increased from prior. No radiopaque stone. Stable caliber biliary ducts. Scattered hepatic cysts. Pancreas: Unremarkable. Spleen: Unremarkable. Adrenals/Urinary Tract: Stable adrenal glands. Unchanged left nephrolithiasis. No obstructing ureteral calculi. No hydronephrosis. Interpolar right kidney 1.8 cm hyperdense hemorrhagic or proteinaceous cyst. No specific follow-up imaging recommended. Unremarkable bladder. Stomach/Bowel: Normal caliber large and small bowel. No bowel wall thickening. Stomach is within normal limits. The appendix is not visualized. Vascular/Lymphatic: Aortic atherosclerosis. No enlarged  abdominal or pelvic lymph nodes. Reproductive: Hysterectomy.  No adnexal mass. Other: No free intraperitoneal fluid or air. Musculoskeletal: No acute fracture. LUMBAR SPINE: Segmentation: 5 lumbar type vertebrae. Alignment: Unchanged alignment from 01/01/2023. No evidence of traumatic listhesis. Vertebrae: No acute fracture. Chronic compression fracture of T12 status post vertebroplasty. Paraspinal and other soft tissues: Reported above. Disc levels: Multilevel spondylosis. Advanced disc space height loss and degenerative endplate changes at L5-S1. Advanced facet arthropathy throughout the lumbar spine. Posterior disc extrusion at L5-S1 causes narrowing of the left lateral recess mild spinal canal narrowing, similar to MRI 08/05/2021. IMPRESSION: 1. Small amount of pericholecystic fluid, slightly increased from prior. No radiopaque stone. If there is clinical concern for acute cholecystitis, consider further evaluation with right upper quadrant ultrasound. 2.  Unchanged left nephrolithiasis. No obstructing ureteral calculi. No hydronephrosis. 3. No acute fracture or traumatic listhesis in the lumbar spine. 4. Cardiomegaly with interlobular septal thickening in the lower lungs and trace left effusion, suggestive of pulmonary edema. Aortic Atherosclerosis (ICD10-I70.0). Electronically Signed   By: Minerva Fester M.D.   On: 01/26/2023 01:13   DG Chest Port 1 View  Result Date: 01/25/2023 CLINICAL DATA:  cp EXAM: PORTABLE CHEST 1 VIEW COMPARISON:  CT angio chest 01/01/2023, chest x-ray 824 FINDINGS: Cardiac paddles overlie the chest. The heart and mediastinal contours are unchanged. Aortic calcification. No focal consolidation. Chronic coarsened interstitial markings with no overt pulmonary edema. No pleural effusion. No pneumothorax. No acute osseous abnormality. IMPRESSION: No active disease. Electronically Signed   By: Tish Frederickson M.D.   On: 01/25/2023 23:36   US Abdomen Limited RUQ (LIVER/GB)  Result Date: 01/14/2023 CLINICAL DATA:  [id number] RUQ abdominal pain [id number] EXAM: ULTRASOUND ABDOMEN LIMITED RIGHT UPPER QUADRANT COMPARISON:  CT scan angiography abdomen and pelvis from 01/01/2023. FINDINGS: Examination is markedly limited due to patient related factors. Gallbladder: No gallstones or wall thickening visualized. No sonographic Murphy sign noted by sonographer. There are at least 2-3 sessile gallbladder polyps with largest measuring up to 6.3 mm. No associated gallbladder wall thickening. Please see follow-up recommendations below. Common bile duct: Diameter: Less than 4 mm. Liver: Markedly limited evaluation. No focal lesion identified. Within normal limits in parenchymal echogenicity. Portal vein is patent on color Doppler imaging with normal direction of blood flow towards the liver. Other: None. IMPRESSION: 1. Markedly limited examination due to patient related factors. 2. No cholelithiasis or sonographic evidence of acute cholecystitis. 3. Multiple low  risk sessile gallbladder polyps with largest measuring up to 6.3 mm. No further follow-up is recommended as per the guidelines described below. Management of Incidentally Detected Gallbladder Polyps: Society of Radiologists in Ultrasound Consensus Conference Recommendations (published online November 27, 2020): [email or web address] Extremely low-risk polyps, i.e. pedunculated ball-on-the-wall or thin stalk: <9 mm: no follow-up 10-14 mm: follow-up at 6, 12 and 24 months >15 mm: surgical consult Low-risk polyps, i.e. pedunculated with a thick or wide stalk, or sessile: ?6 mm: no follow-up 7-9 mm follow-up ultrasound at 12 months 10-14 mm: follow-up ultrasound at 6, 12, 24, and 36 months vs surgical consult >15 mm: surgical consult Intermediate risk: focal wall-thickening >27mm adjacent to polyp: <6 mm: follow-up at 6, 12, 24, 36 months vs surgical consult >7 mm: surgical consult Electronically Signed   By: Jules Schick M.D.   On: 01/14/2023 17:35   DG ABD ACUTE 2+V W 1V CHEST  Result Date: 01/14/2023 CLINICAL DATA:  abd pain, distention. EXAM: DG ABDOMEN ACUTE WITH 1 VIEW CHEST COMPARISON:  None Available. FINDINGS: The bowel  gas pattern is non-obstructive.  Small stool burden. No evidence of pneumoperitoneum, within the limitations of a supine film. No acute osseous abnormalities. Marked thoracolumbar spinal curvature noted. Bilateral lungs and bilateral lateral costophrenic angle are clear. Stable cardio mediastinal silhouette. Surgical changes, devices, tubes and lines: Redemonstration of calcification overlying the left lower hemithorax, likely from breast implant. IMPRESSION: 1. Nonobstructive bowel gas pattern. Small stool burden. 2. No active cardiopulmonary disease. Electronically Signed   By: Jules Schick M.D.   On: 01/14/2023 15:22     Subjective: Patient seen and examined at bedside, resting calmly.  RN present at bedside.  Continues to complain of mild pain under her right breast  overlying the elastic portion of her bra.  Patient with no active lesions/pustules.  Discussed with patient may need to consider removal of bra, but states "I have been told by multiple doctors never to be without a bra unless showering".  No other specific complaints at this time.  Tolerating diet.  Discharging to SNF today.  Denies headache, no visual changes, no chest pain, no palpitations, no shortness of breath, no fever/chills/night sweats, no nausea/vomiting/diarrhea, no focal weakness, no fatigue, no paresthesias.  No acute events overnight per nurse staff.  Discharge Exam: Vitals:   02/03/23 0817 02/03/23 0832  BP:  124/82  Pulse: 93 89  Resp: 20 18  Temp:  (!) 97.4 F (36.3 C)  SpO2: 99% 97%   Vitals:   02/02/23 2050 02/03/23 0510 02/03/23 0817 02/03/23 0832  BP: (!) 152/74 (!) 158/79  124/82  Pulse: (!) 104 98 93 89  Resp:   20 18  Temp: 97.7 F (36.5 C) 97.8 F (36.6 C)  (!) 97.4 F (36.3 C)  TempSrc: Oral Oral  Oral  SpO2: 96% 100% 99% 97%  Weight:      Height:        Physical Exam: GEN: NAD, alert and oriented x 3, obese HEENT: NCAT, PERRL, EOMI, sclera clear, MMM PULM: CTAB w/o wheezes/crackles, normal respiratory effort, on room air CV: RRR w/o M/G/R GI: abd soft, NTND, NABS, no R/G/M MSK: no peripheral edema, muscle strength globally intact 5/5 bilateral upper/lower extremities NEURO: CN II-XII intact, no focal deficits, sensation to light touch intact PSYCH: normal mood/affect Integumentary: Chronic appearing skin lesion under right breast without erythema/fluctuance, no pustules, otherwise no other concerning rashes/lesions/wounds noted on exposed skin surfaces.      The results of significant diagnostics from this hospitalization (including imaging, microbiology, ancillary and laboratory) are listed below for reference.     Microbiology: Recent Results (from the past 240 hour(s))  Culture, blood (Routine X 2) w Reflex to ID Panel     Status: None    Collection Time: 01/26/23  5:35 AM   Specimen: BLOOD  Result Value Ref Range Status   Specimen Description BLOOD BLOOD RIGHT HAND  Final   Special Requests   Final    BOTTLES DRAWN AEROBIC AND ANAEROBIC Blood Culture adequate volume   Culture   Final    NO GROWTH 5 DAYS Performed at [facility], [address], [place], [place] [address]    Report Status 01/31/2023 FINAL  Final  Culture, blood (Routine X 2) w Reflex to ID Panel     Status: None   Collection Time: 01/26/23  5:37 AM   Specimen: BLOOD  Result Value Ref Range Status   Specimen Description BLOOD BLOOD LEFT HAND  Final   Special Requests   Final    BOTTLES DRAWN  AEROBIC AND ANAEROBIC Blood Culture adequate volume   Culture   Final    NO GROWTH 5 DAYS Performed at [facility], [address], [place], [place] [address]    Report Status 01/31/2023 FINAL  Final  Surgical PCR screen     Status: None   Collection Time: 01/27/23  7:39 PM   Specimen: Nasal Mucosa; Nasal Swab  Result Value Ref Range Status   MRSA, PCR NEGATIVE NEGATIVE Final   Staphylococcus aureus NEGATIVE NEGATIVE Final    Comment: (NOTE) The Xpert SA Assay (FDA approved for NASAL specimens in patients 56 years of age and older), is one component of a comprehensive surveillance program. It is not intended to diagnose infection nor to guide or monitor treatment. Performed at [facility], [address], [place], [place] [address]      Labs: BNP (last 3 results) No results for input(s): "BNP" in the last 8760 hours. Basic Metabolic Panel: Recent Labs  Lab 01/29/23 0942 01/30/23 0702 01/31/23 1137 02/01/23 0639 02/02/23 0617  NA 140 140 138 140 137  K 4.5 3.9 3.8 4.5 4.3  CL 109 107 108 110 106  CO2 25 25 24 23 23   GLUCOSE 114* 83 98 91 85  BUN 15 11 12 18  26*  CREATININE 1.24* 1.07* 0.96 1.16* 1.37*  CALCIUM 8.1* 8.1* 8.3* 8.8* 8.7*   Liver Function Tests: Recent Labs  Lab 01/28/23 0632 01/29/23 0942  01/30/23 0702 01/31/23 1137 02/01/23 0639  AST 35 35 29 36 41  ALT 41 30 22 22 25   ALKPHOS 55 53 48 48 44  BILITOT 0.6 0.7 0.6 0.5 0.4  PROT 5.7* 5.2* 5.1* 5.4* 4.8*  ALBUMIN 3.4* 3.1* 2.9* 3.0* 2.8*   No results for input(s): "LIPASE", "AMYLASE" in the last 168 hours. No results for input(s): "AMMONIA" in the last 168 hours. CBC: Recent Labs  Lab 01/29/23 0942 01/30/23 0702 01/31/23 1137 02/01/23 0639 02/02/23 0617  WBC 6.5 5.6 6.3 6.6 6.8  HGB 11.1* 11.0* 11.3* 10.4* 10.4*  HCT 34.9* 33.7* 34.3* 32.1* 32.5*  MCV 93.3 91.6 90.5 89.2 89.8  PLT 230 224 218 223 202   Cardiac Enzymes: No results for input(s): "CKTOTAL", "CKMB", "CKMBINDEX", "TROPONINI" in the last 168 hours. BNP: Invalid input(s): "POCBNP" CBG: Recent Labs  Lab 01/28/23 2157  GLUCAP 89   D-Dimer No results for input(s): "DDIMER" in the last 72 hours. Hgb A1c No results for input(s): "HGBA1C" in the last 72 hours. Lipid Profile No results for input(s): "CHOL", "HDL", "LDLCALC", "TRIG", "CHOLHDL", "LDLDIRECT" in the last 72 hours. Thyroid function studies No results for input(s): "TSH", "T4TOTAL", "T3FREE", "THYROIDAB" in the last 72 hours.  Invalid input(s): "FREET3" Anemia work up No results for input(s): "VITAMINB12", "FOLATE", "FERRITIN", "TIBC", "IRON", "RETICCTPCT" in the last 72 hours. Urinalysis    Component Value Date/Time   COLORURINE YELLOW 01/14/2023 1945   APPEARANCEUR CLEAR 01/14/2023 1945   LABSPEC 1.006 01/14/2023 1945   PHURINE 8.0 01/14/2023 1945   GLUCOSEU NEGATIVE 01/14/2023 1945   HGBUR NEGATIVE 01/14/2023 1945   BILIRUBINUR NEGATIVE 01/14/2023 1945   BILIRUBINUR neg 12/09/2019 0925   KETONESUR NEGATIVE 01/14/2023 1945   PROTEINUR NEGATIVE 01/14/2023 1945   UROBILINOGEN negative (A) 12/09/2019 0925   NITRITE NEGATIVE 01/14/2023 1945   LEUKOCYTESUR NEGATIVE 01/14/2023 1945   Sepsis Labs Recent Labs  Lab 01/30/23 0702 01/31/23 1137 02/01/23 0639 02/02/23 0617  WBC  5.6 6.3 6.6 6.8   Microbiology Recent Results (from the past 240  hour(s))  Culture, blood (Routine X 2) w Reflex to ID Panel     Status: None   Collection Time: 01/26/23  5:35 AM   Specimen: BLOOD  Result Value Ref Range Status   Specimen Description BLOOD BLOOD RIGHT HAND  Final   Special Requests   Final    BOTTLES DRAWN AEROBIC AND ANAEROBIC Blood Culture adequate volume   Culture   Final    NO GROWTH 5 DAYS Performed at [facility], [address] [place], [place] [address]    Report Status 01/31/2023 FINAL  Final  Culture, blood (Routine X 2) w Reflex to ID Panel     Status: None   Collection Time: 01/26/23  5:37 AM   Specimen: BLOOD  Result Value Ref Range Status   Specimen Description BLOOD BLOOD LEFT HAND  Final   Special Requests   Final    BOTTLES DRAWN AEROBIC AND ANAEROBIC Blood Culture adequate volume   Culture   Final    NO GROWTH 5 DAYS Performed at [facility], [address], [place], [place] [address]    Report Status 01/31/2023 FINAL  Final  Surgical PCR screen     Status: None   Collection Time: 01/27/23  7:39 PM   Specimen: Nasal Mucosa; Nasal Swab  Result Value Ref Range Status   MRSA, PCR NEGATIVE NEGATIVE Final   Staphylococcus aureus NEGATIVE NEGATIVE Final    Comment: (NOTE) The Xpert SA Assay (FDA approved for NASAL specimens in patients 56 years of age and older), is one component of a comprehensive surveillance program. It is not intended to diagnose infection nor to guide or monitor treatment. Performed at [facility], [address], [place], [place] [address]      Time coordinating discharge: Over 30 minutes  SIGNED:   Alvira Philips [place], DO  [facility] 02/03/2023, 11:25 AM

## 2023-02-03 NOTE — TOC Transition Note (Signed)
Transition of Care [facility]) - CM/SW Discharge Note   Patient Details  Name: [name] MRN: [id number] Date of Birth: 01/18/1936  [facility] CM/SW Contact:  Erin Sons, LCSW Phone Number: 02/03/2023, 12:14 PM   Clinical Narrative:        Per MD patient ready for DC to [facility](SNF). RN, patient, patient's family, and facility notified of DC. Discharge Summary and FL2 sent to facility. RN to call report prior to discharge [phone]. ). DC packet on chart. Pt's brother will transport pt to SNF at DC.   CSW will sign off for now as social work intervention is no longer needed. Please consult [place] again if new needs arise.  Final next level of care: Skilled Nursing Facility Barriers to Discharge: No Barriers Identified   Patient Goals and CMS Choice      Discharge Placement                Patient chooses bed at: [facility] Patient to be transferred to facility by: [facility] Name of family member notified: Brother Kathlene November Patient and family notified of of transfer: 02/03/23  Discharge Plan and Services Additional resources added to the After Visit Summary for                                       Social Determinants of Health (SDOH) Interventions SDOH Screenings   Food Insecurity: No Food Insecurity (01/26/2023)  Housing: Patient Declined (01/26/2023)  Transportation Needs: No Transportation Needs (01/26/2023)  Utilities: Not At Risk (01/26/2023)  Alcohol Screen: Low Risk  (02/06/2022)  Depression (PHQ2-9): Low Risk  (02/06/2022)  Financial Resource Strain: Low Risk  (02/06/2022)  Physical Activity: Inactive (02/06/2022)  Social Connections: Moderately Integrated (02/06/2022)  Stress: No Stress Concern Present (02/06/2022)  Tobacco Use: Low Risk  (01/26/2023)     Readmission Risk Interventions     No data to display

## 2023-02-03 NOTE — Plan of Care (Signed)

## 2023-02-03 NOTE — Progress Notes (Signed)
Physical Therapy Treatment Patient Details Name: [name] MRN: [id number] DOB: May 18, 1936 Today's Date: 02/03/2023   History of Present Illness 87 y.o. female presents to [facility] hospital on 01/25/2023 with abdominal pain. Pt found to have gall stones. She is now s/p Pacemaker placement on 01/28/23. PMH includes CKD IV, HTN, PAF, hypothyroidism, HLD, CVA.    PT Comments  Pt tolerates treatment well. Pt demonstrates inconsistent coordination impairments in RLE throughout session, demonstrating improved control when ambulating while touching her RLE with her LUE, and then continued improvement in control with further ambulation without this tactile input. PT provides encouragement throughout session. Pt remains dependent on UE support for stability. PT continues to recommend inpatient PT placement at the time of discharge.    If plan is discharge home, recommend the following: A little help with walking and/or transfers;Assistance with cooking/housework;Assist for transportation;Help with stairs or ramp for entrance   Can travel by private vehicle     Yes  Equipment Recommendations  None recommended by PT    Recommendations for Other Services       Precautions / Restrictions Precautions Precautions: Fall;ICD/Pacemaker Restrictions Weight Bearing Restrictions: No     Mobility  Bed Mobility Overal bed mobility: Needs Assistance Bed Mobility: Rolling, Sidelying to Sit Rolling: Contact guard assist Sidelying to sit: Contact guard assist            Transfers Overall transfer level: Needs assistance Equipment used: 1 person hand held assist Transfers: Sit to/from Stand Sit to Stand: Contact guard assist                Ambulation/Gait Ambulation/Gait assistance: Min assist Gait Distance (Feet): 100 Feet Assistive device: 1 person hand held assist Gait Pattern/deviations: Step-to pattern, Ataxic Gait velocity: reduced Gait velocity interpretation: <1.31 ft/sec,  indicative of household ambulator   General Gait Details: pt ambulates with ataxic gait, during ambulation trial pt reaches to RLE when ambulating and reports that touching her RLE with LUE seems to improve control. PT does note improved coordination of RLE when LUE is touching. RLE coordination remains improved later during ambulation trial without LUE touching, however gait still remains slowed.   Stairs             Wheelchair Mobility     Tilt Bed    Modified Rankin (Stroke Patients Only)       Balance Overall balance assessment: Needs assistance Sitting-balance support: No upper extremity supported, Feet supported Sitting balance-Leahy Scale: Good     Standing balance support: Single extremity supported, Reliant on assistive device for balance Standing balance-Leahy Scale: Poor                              Cognition Arousal: Alert Behavior During Therapy: Anxious Overall Cognitive Status: No family/caregiver present to determine baseline cognitive functioning Area of Impairment: Awareness, Safety/judgement                         Safety/Judgement: Decreased awareness of safety Awareness: Emergent            Exercises      General Comments General comments (skin integrity, edema, etc.): VSS on RA      Pertinent Vitals/Pain Pain Assessment Pain Assessment: Faces Faces Pain Scale: Hurts even more Pain Location: R hip and RLQ Pain Descriptors / Indicators: Grimacing Pain Intervention(s): Monitored during session    Home Living  Prior Function            PT Goals (current goals can now be found in the care plan section) Acute Rehab PT Goals Patient Stated Goal: to improve strength and activity tolerance Progress towards PT goals: Progressing toward goals    Frequency    Min 1X/week      PT Plan      Co-evaluation              AM-PAC PT "6 Clicks" Mobility   Outcome  Measure  Help needed turning from your back to your side while in a flat bed without using bedrails?: A Little Help needed moving from lying on your back to sitting on the side of a flat bed without using bedrails?: A Little Help needed moving to and from a bed to a chair (including a wheelchair)?: A Little Help needed standing up from a chair using your arms (e.g., wheelchair or bedside chair)?: A Little Help needed to walk in hospital room?: A Little Help needed climbing 3-5 steps with a railing? : Total 6 Click Score: 16    End of Session Equipment Utilized During Treatment: Gait belt Activity Tolerance: Patient tolerated treatment well Patient left: in chair;with call bell/phone within reach Nurse Communication: Mobility status PT Visit Diagnosis: Other abnormalities of gait and mobility (R26.89);Muscle weakness (generalized) (M62.81);Other symptoms and signs involving the nervous system (R29.898)     Time: 4010-2725 PT Time Calculation (min) (ACUTE ONLY): 27 min  Charges:    $Gait Training: 8-22 mins $Therapeutic Activity: 8-22 mins PT General Charges $$ ACUTE PT VISIT: 1 Visit                     Arlyss Gandy, PT, DPT Acute Rehabilitation Office [phone]    Arlyss Gandy 02/03/2023, 12:17 PM

## 2023-02-03 NOTE — Plan of Care (Signed)
  Problem: Cardiac: Goal: Ability to achieve and maintain adequate cardiopulmonary perfusion will improve 02/03/2023 1133 by Tresa Endo, RN Outcome: Adequate for Discharge 02/03/2023 1133 by Tresa Endo, RN Outcome: Adequate for Discharge   Problem: Education: Goal: Knowledge of cardiac device and self-care will improve 02/03/2023 1133 by Tresa Endo, RN Outcome: Adequate for Discharge 02/03/2023 1133 by Tresa Endo, RN Outcome: Adequate for Discharge Goal: Ability to safely manage health related needs after discharge will improve 02/03/2023 1133 by Tresa Endo, RN Outcome: Adequate for Discharge 02/03/2023 1133 by Tresa Endo, RN Outcome: Adequate for Discharge Goal: Individualized Educational Video(s) 02/03/2023 1133 by Tresa Endo, RN Outcome: Adequate for Discharge 02/03/2023 1133 by Tresa Endo, RN Outcome: Adequate for Discharge   Problem: Skin Integrity: Goal: Risk for impaired skin integrity will decrease 02/03/2023 1133 by Tresa Endo, RN Outcome: Adequate for Discharge 02/03/2023 1133 by Tresa Endo, RN Outcome: Adequate for Discharge   Problem: Safety: Goal: Ability to remain free from injury will improve 02/03/2023 1133 by Tresa Endo, RN Outcome: Adequate for Discharge 02/03/2023 1133 by Tresa Endo, RN Outcome: Adequate for Discharge   Problem: Pain Managment: Goal: General experience of comfort will improve 02/03/2023 1133 by Tresa Endo, RN Outcome: Adequate for Discharge 02/03/2023 1133 by Tresa Endo, RN Outcome: Adequate for Discharge   Problem: Nutrition: Goal: Adequate nutrition will be maintained 02/03/2023 1133 by Tresa Endo, RN Outcome: Adequate for Discharge 02/03/2023 1133 by Tresa Endo, RN Outcome: Adequate for Discharge   Problem: Coping: Goal: Level of anxiety will decrease 02/03/2023 1133 by Tresa Endo, RN Outcome: Adequate for Discharge 02/03/2023 1133 by Tresa Endo, RN Outcome: Adequate  for Discharge   Problem: Activity: Goal: Risk for activity intolerance will decrease 02/03/2023 1133 by Tresa Endo, RN Outcome: Adequate for Discharge 02/03/2023 1133 by Tresa Endo, RN Outcome: Adequate for Discharge   Problem: Health Behavior/Discharge Planning: Goal: Ability to manage health-related needs will improve 02/03/2023 1133 by Tresa Endo, RN Outcome: Adequate for Discharge 02/03/2023 1133 by Tresa Endo, RN Outcome: Adequate for Discharge

## 2023-02-03 NOTE — Plan of Care (Signed)
  Problem: Education: Goal: Knowledge of General Education information will improve Description: Including pain rating scale, medication(s)/side effects and non-pharmacologic comfort measures Outcome: Progressing   Problem: Health Behavior/Discharge Planning: Goal: Ability to manage health-related needs will improve Outcome: Progressing   Problem: Clinical Measurements: Goal: Ability to maintain clinical measurements within normal limits will improve Outcome: Progressing Goal: Will remain free from infection Outcome: Progressing Goal: Diagnostic test results will improve Outcome: Progressing Goal: Respiratory complications will improve Outcome: Progressing Goal: Cardiovascular complication will be avoided Outcome: Progressing   Problem: Activity: Goal: Risk for activity intolerance will decrease Outcome: Progressing   Problem: Nutrition: Goal: Adequate nutrition will be maintained Outcome: Progressing   Problem: Coping: Goal: Level of anxiety will decrease Outcome: Progressing   Problem: Elimination: Goal: Will not experience complications related to bowel motility Outcome: Progressing Goal: Will not experience complications related to urinary retention Outcome: Progressing   Problem: Pain Managment: Goal: General experience of comfort will improve Outcome: Progressing   Problem: Safety: Goal: Ability to remain free from injury will improve Outcome: Progressing   Problem: Skin Integrity: Goal: Risk for impaired skin integrity will decrease Outcome: Progressing   Problem: Education: Goal: Knowledge of cardiac device and self-care will improve Outcome: Progressing Goal: Ability to safely manage health related needs after discharge will improve Outcome: Progressing Goal: Individualized Educational Video(s) Outcome: Progressing

## 2023-02-04 ENCOUNTER — Encounter: Payer: Self-pay | Admitting: Adult Health

## 2023-02-04 ENCOUNTER — Other Ambulatory Visit: Payer: Self-pay | Admitting: Adult Health

## 2023-02-04 ENCOUNTER — Non-Acute Institutional Stay (SKILLED_NURSING_FACILITY): Payer: PPO | Admitting: Adult Health

## 2023-02-04 DIAGNOSIS — M545 Low back pain, unspecified: Secondary | ICD-10-CM

## 2023-02-04 DIAGNOSIS — B029 Zoster without complications: Secondary | ICD-10-CM | POA: Diagnosis not present

## 2023-02-04 DIAGNOSIS — G8929 Other chronic pain: Secondary | ICD-10-CM

## 2023-02-04 DIAGNOSIS — D631 Anemia in chronic kidney disease: Secondary | ICD-10-CM

## 2023-02-04 DIAGNOSIS — K219 Gastro-esophageal reflux disease without esophagitis: Secondary | ICD-10-CM | POA: Diagnosis not present

## 2023-02-04 DIAGNOSIS — N184 Chronic kidney disease, stage 4 (severe): Secondary | ICD-10-CM

## 2023-02-04 DIAGNOSIS — N179 Acute kidney failure, unspecified: Secondary | ICD-10-CM | POA: Diagnosis not present

## 2023-02-04 DIAGNOSIS — E782 Mixed hyperlipidemia: Secondary | ICD-10-CM

## 2023-02-04 DIAGNOSIS — Z8673 Personal history of transient ischemic attack (TIA), and cerebral infarction without residual deficits: Secondary | ICD-10-CM

## 2023-02-04 DIAGNOSIS — I130 Hypertensive heart and chronic kidney disease with heart failure and stage 1 through stage 4 chronic kidney disease, or unspecified chronic kidney disease: Secondary | ICD-10-CM

## 2023-02-04 DIAGNOSIS — I5032 Chronic diastolic (congestive) heart failure: Secondary | ICD-10-CM | POA: Diagnosis not present

## 2023-02-04 DIAGNOSIS — N189 Chronic kidney disease, unspecified: Secondary | ICD-10-CM

## 2023-02-04 DIAGNOSIS — E039 Hypothyroidism, unspecified: Secondary | ICD-10-CM | POA: Diagnosis not present

## 2023-02-04 DIAGNOSIS — K810 Acute cholecystitis: Secondary | ICD-10-CM | POA: Diagnosis not present

## 2023-02-04 DIAGNOSIS — I482 Chronic atrial fibrillation, unspecified: Secondary | ICD-10-CM

## 2023-02-04 DIAGNOSIS — F0150 Vascular dementia without behavioral disturbance: Secondary | ICD-10-CM | POA: Diagnosis not present

## 2023-02-04 DIAGNOSIS — J42 Unspecified chronic bronchitis: Secondary | ICD-10-CM

## 2023-02-04 DIAGNOSIS — I700 Atherosclerosis of aorta: Secondary | ICD-10-CM

## 2023-02-04 DIAGNOSIS — I495 Sick sinus syndrome: Secondary | ICD-10-CM

## 2023-02-04 MED ORDER — OXYCODONE HCL 5 MG PO TABS: 5.0000 mg | ORAL_TABLET | Freq: Four times a day (QID) | ORAL | 0 refills | Status: DC | PRN

## 2023-02-04 NOTE — Progress Notes (Signed)
Location:  [facility] Room Number: 128 Place of Service:  SNF (31)   CODE STATUS: full   Allergies  Allergen Reactions   Epinephrine Anaphylaxis   Demerol [Meperidine] Other (See Comments)    headaches   Morphine Sulfate Anxiety    Patient states it messes with her mind makes her narcoleptic   Vicodin [Hydrocodone-Acetaminophen] Other (See Comments)    York Spaniel it makes her crazy. Pt tolerates acetaminophen    Chief Complaint  Patient presents with   Hospitalization Follow-up    HPI:  She is a 87 year old woman who has been hospitalized from 01-25-23 through 02-03-23. Her medical history includes: CKD stage 3b; hypertension; paroxsymal atrial fibrillation; hypothyroidism; hyperlipidemia; history of CVA and chronic anemia. She presented to the ED with complaint of abdominal pain. Her HIDA scan was negative; general surgery then signed off as no need for cholecystectomy was indicated. Her hospitalization was complicated by bradycardia and she underwent a pacemaker placement. She also had right side ataxia with negative CT scan X2. She developed a rash under her right breast and has been treated empirically for shingles. Abdominal pain: her ct scan demonstrated pericholecystic fluid. The right upper quad ultrasound with small gall stone, 9 mm; gallbladder with wall polyp and mild wall thickening. The HIDA scan without duct or cystic duct obstruction. Right flank rash: is concerned for shingles. No active pustules or lesion however follows a dermatomal pattern.  She was started on valtrex and 14 days of gabapentin.  Right breast concern for rupture implant capsule: ct chest noted linear folds versus rupture right breast implant capsule   New onset right-sided ataxia: has history of cva. Repeat ct scans without acute findings. The TEE 60-65% EF with no regional wall motion abnormalities.  Tachybrady syndrome: symptomatic brady cardia with atrial fibrillation: she underwent  pacemaker placement on 01-28-23.   She is here for short term rehab with her goal to return back home. She will continue to be followed for her chronic illnesses including:   Gastroesophageal reflux disease without esophagitis    Atrial fibrillation chronic/tachy-brady syndrome:      Hypertension with congestive heart failure and chronic renal failure:    Chronic diastolic CHF (Congestive heart failure)      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69   age 3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 2 weeks   Hemorrhoids 2007   History of kidney stones    HTN (hypertension)    Hyperplastic colon polyp 2005   Hypothyroidism    Narcolepsy    PONV (postoperative nausea and vomiting)    TIA (transient ischemic attack)     Past Surgical History:  Procedure Laterality Date   ABDOMINAL HYSTERECTOMY     age 71, complicated by poor wound healing, followed by revision of scar and radiation for ?malignancy   BACK SURGERY  07/2018   lost 2 inches   BREAST ENHANCEMENT SURGERY     BREAST IMPLANT EXCHANGE Right 06/23/2016  Procedure: RIGHT BREAST IMPLANT REMOVAL AND REPLACEMENT;  Surgeon: Louisa Second, MD;  Location: [facility];  Service: Plastics;  Laterality: Right;   COLONOSCOPY  2005   2 hyperplastic polyps   COLONOSCOPY  2007   Dr. Darrick Penna- hemorrhoids   COLONOSCOPY  2008   Dr. Steva Ready polyp- rare sigmoid diverticulosis, internal hemorrhoids   COLONOSCOPY  12/01/2011   Procedure: COLONOSCOPY;  Surgeon: West Bali, MD;  Location: [facility];  Service: Endoscopy;  Laterality: N/A;  12:30 PM    COLONOSCOPY WITH PROPOFOL N/A 05/22/2022   Procedure: COLONOSCOPY WITH PROPOFOL;  Surgeon: Lanelle Bal, DO;  Location: [facility];  Service: Endoscopy;  Laterality: N/A;  1:00 PM   CYSTOSCOPY/URETEROSCOPY/HOLMIUM LASER/STENT PLACEMENT Left 02/29/2020   Procedure: CYSTOSCOPY/URETEROSCOPY/HOLMIUM LASER/STENT PLACEMENT;  Surgeon: Rene Paci, MD;  Location: [facility];  Service: Urology;  Laterality: Left;   epidural steroid injection     She is getting injections with Dr. Ethelene Hal q3wks.   ESOPHAGOGASTRODUODENOSCOPY (EGD) WITH ESOPHAGEAL DILATION N/A 04/15/2013   Procedure: ESOPHAGOGASTRODUODENOSCOPY (EGD) WITH ESOPHAGEAL DILATION;  Surgeon: West Bali, MD;  Location: [facility];  Service: Endoscopy;  Laterality: N/A;  11:45-moved to 12:30 [name] to notify pt   EYE SURGERY  2010   EYE SURGERY     IR KYPHO LUMBAR INC FX REDUCE BONE BX UNI/BIL CANNULATION INC/IMAGING  08/23/2018   IR RADIOLOGIST EVAL & MGMT  12/23/2018   PACEMAKER IMPLANT N/A 01/28/2023   Procedure: PACEMAKER IMPLANT;  Surgeon: Marinus Maw, MD;  Location: [facility];  Service: Cardiovascular;  Laterality: N/A;   POLYPECTOMY  05/22/2022   Procedure: POLYPECTOMY;  Surgeon: Lanelle Bal, DO;  Location: [facility];  Service: Endoscopy;;   right thumb surgery      Social History   Socioeconomic History   Marital status: Widowed    Spouse name: Not on file   Number of children: 1   Years of education: 31   Highest education level: 12th grade  Occupational History   Not on file  Tobacco Use   Smoking status: Never    Passive exposure: Never   Smokeless tobacco: Never  Vaping Use   Vaping status: Never Used  Substance and Sexual Activity   Alcohol use: No   Drug use: No   Sexual activity: Not Currently    Partners: Male  Other Topics Concern   Not on file  Social History Narrative   Lives alone   Right Handed    Drinks no caffeine daily   Social Determinants of Health    Financial Resource Strain: Low Risk  (02/06/2022)   Overall Financial Resource Strain (CARDIA)    Difficulty of Paying Living Expenses: Not hard at all  Food Insecurity: No Food Insecurity (01/26/2023)   Hunger Vital Sign    Worried About Running Out of Food in the Last Year: Never true    Ran Out of Food in the Last Year: Never true  Transportation Needs: No Transportation Needs (01/26/2023)   PRAPARE - Administrator, Civil Service (Medical): No    Lack of Transportation (Non-Medical): No  Physical Activity: Inactive (02/06/2022)   Exercise Vital Sign    Days of Exercise per Week: 0 days    Minutes of Exercise per Session: 0 min  Stress: No Stress Concern Present (02/06/2022)   [employer] of Occupational Health - Occupational Stress Questionnaire    Feeling of Stress : Only a little  Social  Connections: Moderately Integrated (02/06/2022)   Social Connection and Isolation Panel [NHANES]    Frequency of Communication with Friends and Family: More than three times a week    Frequency of Social Gatherings with Friends and Family: More than three times a week    Attends Religious Services: More than 4 times per year    Active Member of [employer] or Organizations: Yes    Attends Banker Meetings: More than 4 times per year    Marital Status: Widowed  Intimate Partner Violence: Not At Risk (01/26/2023)   Humiliation, Afraid, Rape, and Kick questionnaire    Fear of Current or Ex-Partner: No    Emotionally Abused: No    Physically Abused: No    Sexually Abused: No   Family History  Problem Relation Age of Onset   Colon cancer Father        age 68   Prostate cancer Father    Pancreatic cancer Mother        age 28      VITAL SIGNS BP 124/66   Pulse 66   Temp 97.6 F (36.4 C)   Resp 20   Ht 5\' 2"  (1.575 m)   Wt 146 lb 8 oz (66.5 kg)   SpO2 95%   BMI 26.80 kg/m   Outpatient Encounter Medications as of 02/04/2023  Medication Sig Note   ferrous sulfate  325 (65 FE) MG tablet Take 325 mg by mouth daily with breakfast.    albuterol (VENTOLIN HFA) 108 (90 Base) MCG/ACT inhaler Inhale 2 puffs into the lungs every 6 (six) hours as needed for wheezing or shortness of breath.    amLODipine (NORVASC) 10 MG tablet Take 1 tablet (10 mg total) by mouth daily. For BP    apixaban (ELIQUIS) 2.5 MG TABS tablet Take 1 tablet (2.5 mg total) by mouth 2 (two) times daily.    Ascorbic Acid (VITAMIN C) 1000 MG tablet Take 1,000 mg by mouth daily.    B Complex Vitamins (VITAMIN B COMPLEX) TABS Take 1 tablet by mouth daily.    Calcium Carb-Cholecalciferol (CALCIUM 600 + D PO) Take 1 tablet by mouth in the morning and at bedtime.    ezetimibe (ZETIA) 10 MG tablet Take 1 tablet (10 mg total) by mouth daily.    furosemide (LASIX) 40 MG tablet Take 1 tablet (40 mg total) by mouth daily.    gabapentin (NEURONTIN) 100 MG capsule Take 1 capsule (100 mg total) by mouth 3 (three) times daily for 14 days.    levothyroxine (SYNTHROID) 75 MCG tablet Take 75 mcg by mouth daily before breakfast.    memantine (NAMENDA) 10 MG tablet Take 1 tablet (10 mg total) by mouth 2 (two) times daily.    metoprolol tartrate (LOPRESSOR) 25 MG tablet Take 1 tablet (25 mg total) by mouth 2 (two) times daily.    Omega-3 Fatty Acids (FISH OIL) 1200 MG CAPS Take 1,200 mg by mouth daily.    oxyCODONE (OXY IR/ROXICODONE) 5 MG immediate release tablet Take 1 tablet (5 mg total) by mouth every 6 (six) hours as needed for severe pain.    pantoprazole (PROTONIX) 40 MG tablet Take 1 tablet (40 mg total) by mouth daily. 01/26/2023: Pt couldn't remember taking this   Potassium Chloride ER 20 MEQ TBCR Take 1 tablet (20 mEq total) by mouth daily. 1 tab daily by mouth--- take while taking Lasix/furosemide    [START ON 02/05/2023] predniSONE (DELTASONE) 20 MG tablet Take 2 tablets (40 mg total)  by mouth daily with breakfast for 3 days.    [START ON 02/05/2023] valACYclovir (VALTREX) 1000 MG tablet Take 1 tablet (1,000  mg total) by mouth daily for 3 days.    Vitamin D, Cholecalciferol, 25 MCG (1000 UT) TABS Take 1,000 Int'l Units/1.49m2 by mouth daily at 6 (six) AM.    vitamin E 180 MG (400 UNITS) capsule Take 400 Units by mouth daily.    [DISCONTINUED] Ferrous Gluconate (IRON) 240 (27 Fe) MG TABS Take 1 tablet by mouth daily.    No facility-administered encounter medications on file as of 02/04/2023.     SIGNIFICANT DIAGNOSTIC EXAMS  TODAY  01-25-23: wbc 4.6; hgb 8.1; hct 26.2; mcv 96.0 plt 145; glucose 104; bun 41; creat 2.53; k+ 4.8; na++ 138; ca 8.9; gfr 18; protein 5.8 albumin 3.5 mag 2.6 01-26-23: tsh 1.610; vitamin B 12: 1154; iron 38; tibc 300 ferritin 226 folate 29.8 01-28-23: wbc 6.2; hgb 11.7; hct 36.5; mcv 90.3 plt 253; glucose 93; bun 26; creat 1.34; k+ 3.9; na++ 140; ca 8.2; gfr 39 protein 5.7 albumin 3.4 01-30-23: hgb A1c 5.6; chol 164; ldl 87; trig 70 hdl 72 02-02-23: wbc 68; hgb 10.4; hct 32.5; mcv 89.8 plt 202; glucose 85; bun 26; creat 1.37; k+ 4.3; na++ 137; ca 8.7; gfr 38   Review of Systems  Constitutional:  Negative for malaise/fatigue.  Respiratory:  Negative for cough and shortness of breath.   Cardiovascular:  Negative for chest pain, palpitations and leg swelling.  Gastrointestinal:  Negative for abdominal pain, constipation and heartburn.  Musculoskeletal:  Positive for back pain, joint pain and myalgias.       I hurt on my right side from my neck down   Skin: Negative.   Neurological:  Negative for dizziness.  Psychiatric/Behavioral:  The patient is not nervous/anxious.    Physical Exam Constitutional:      General: She is not in acute distress.    Appearance: She is well-developed. She is not diaphoretic.  Neck:     Thyroid: No thyromegaly.  Cardiovascular:     Rate and Rhythm: Normal rate. Rhythm irregular.     Pulses: Normal pulses.     Heart sounds: Normal heart sounds.     Comments: Visual merchandiser present  Pulmonary:     Effort: Pulmonary effort is normal. No respiratory  distress.     Breath sounds: Normal breath sounds.  Abdominal:     General: Bowel sounds are normal. There is no distension.     Palpations: Abdomen is soft.     Tenderness: There is no abdominal tenderness.  Musculoskeletal:        General: Normal range of motion.     Cervical back: Neck supple.     Right lower leg: No edema.     Left lower leg: No edema.     Comments: Right side weakness   Lymphadenopathy:     Cervical: No cervical adenopathy.  Skin:    General: Skin is warm and dry.  Neurological:     Mental Status: She is alert and oriented to person, place, and time.  Psychiatric:        Mood and Affect: Mood normal.       ASSESSMENT/ PLAN:  TODAY  Gastroesophageal reflux disease without esophagitis/ acute cholecystitis: HIDA scan without significant findings; will continue protonix 40 mg daily   2. Atrial fibrillation chronic/tachy-brady syndrome: is status post pacemaker 01-28-23. Will continue lopressor 25 mg twice daily for rate control and will continue eliquis  2.5 mg twice daily   3. Hypertension with congestive heart failure and chronic renal failure: b/p 124/66 will continue norvasc 10 mg daily and lopressor 25 mg twice daily   4. Chronic diastolic CHF (Congestive heart failure) will continue lasix 40 mg daily with k+ 20 meq daily   5. Chronic bronchitis unspecified chronic bronchitis type: will continue albuterol 2 puffs every 6 hours as needed  6. Acquired hypothyroidism: tsh 1.610 will continue synthroid 75 mcg daily   7. Acute kidney injury superimposed on CKD/CKD (Chronic kidney disease) stage iv: bun 26; creat 1.37; gfr 38   8. Chronic low back pain without sciatica: has oxycodone 5 mg every 6 hours as needed will begin tylenol cr 650 mg every 6 hours routinely   9. Vascular dementia without behavioral disturbance: will continue namenda 10 mg twice daily   10. Herpes zoster without complication: will complete valtrex and prednisone for 3 more day; has  gabapentin 100 mg three times daily for 14 days   11. Aortic atherosclerosis: (ct 01-30-23) is on zetia  12. History of CVA: is stable   13. Mixed hyperlipidemia: ldl 78; will continue zetia 10 mg daily   14. Anemia in stage 4 chronic kidney disease: hgb 10.4 will continue iron daily  15. Mild protein calorie malnutrition: albumin 3.4 will begin prosource twice daily    Will check; bmp; hgb/hct    Synthia Innocent NP [facility]   call [phone]

## 2023-02-05 ENCOUNTER — Non-Acute Institutional Stay (SKILLED_NURSING_FACILITY): Payer: Self-pay | Admitting: Internal Medicine

## 2023-02-05 ENCOUNTER — Encounter: Payer: Self-pay | Admitting: Internal Medicine

## 2023-02-05 DIAGNOSIS — I5032 Chronic diastolic (congestive) heart failure: Secondary | ICD-10-CM | POA: Diagnosis not present

## 2023-02-05 DIAGNOSIS — J4540 Moderate persistent asthma, uncomplicated: Secondary | ICD-10-CM | POA: Diagnosis not present

## 2023-02-05 DIAGNOSIS — N184 Chronic kidney disease, stage 4 (severe): Secondary | ICD-10-CM

## 2023-02-05 DIAGNOSIS — I495 Sick sinus syndrome: Secondary | ICD-10-CM | POA: Diagnosis not present

## 2023-02-05 DIAGNOSIS — E440 Moderate protein-calorie malnutrition: Secondary | ICD-10-CM | POA: Diagnosis not present

## 2023-02-05 DIAGNOSIS — G894 Chronic pain syndrome: Secondary | ICD-10-CM | POA: Diagnosis not present
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
## 2023-02-07 ENCOUNTER — Encounter: Payer: Self-pay | Admitting: Internal Medicine

## 2023-02-07 DIAGNOSIS — G894 Chronic pain syndrome: Secondary | ICD-10-CM | POA: Insufficient documentation

## 2023-02-07 DIAGNOSIS — E46 Unspecified protein-calorie malnutrition: Secondary | ICD-10-CM | POA: Insufficient documentation

## 2023-02-07 NOTE — Assessment & Plan Note (Signed)
Paced rhythm without dysrhythmia on exam.

## 2023-02-07 NOTE — Assessment & Plan Note (Signed)
Current creatinine 1.37 with an EGFR of 38 indicating CKD stage IIIb.  Med list reviewed; no indication for change in medications or dosage at this time.  Continue to monitor.

## 2023-02-07 NOTE — Patient Instructions (Signed)
See assessment and plan under each diagnosis in the problem list and acutely for this visit 

## 2023-02-07 NOTE — Assessment & Plan Note (Addendum)
CHF clinically compensated; no NVD or significant peripheral edema. No change in present cardiac regimen indicated.

## 2023-02-07 NOTE — Assessment & Plan Note (Addendum)
She does exhibit high-pitched expiratory wheezing with forced exhalation but bronchospasm not suggested with normal respirations.  O2 sats low normal on RA.  Continue to monitor.
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
## 2023-02-07 NOTE — Assessment & Plan Note (Signed)
Total protein 4.8 and albumin 2.8.  Nutritionist to consult at the SNF.

## 2023-02-09 ENCOUNTER — Other Ambulatory Visit (HOSPITAL_COMMUNITY)
Admission: RE | Admit: 2023-02-09 | Discharge: 2023-02-09 | Disposition: A | Discharge status: Home / Self Care | Payer: PPO | Source: Skilled Nursing Facility | Attending: Adult Health | Admitting: Adult Health

## 2023-02-09 DIAGNOSIS — I130 Hypertensive heart and chronic kidney disease with heart failure and stage 1 through stage 4 chronic kidney disease, or unspecified chronic kidney disease: Secondary | ICD-10-CM | POA: Insufficient documentation

## 2023-02-09 LAB — BASIC METABOLIC PANEL
Anion gap: 13 (ref 5–15)
BUN: 46 mg/dL — ABNORMAL HIGH (ref 8–23)
CO2: 29 mmol/L (ref 22–32)
Calcium: 9.8 mg/dL (ref 8.9–10.3)
Chloride: 99 mmol/L (ref 98–111)
Creatinine, Ser: 1.28 mg/dL — ABNORMAL HIGH (ref 0.44–1.00)
GFR, Estimated: 41 mL/min — ABNORMAL LOW (ref >60)
Glucose, Bld: 79 mg/dL (ref 70–99)
Potassium: 3.6 mmol/L (ref 3.5–5.1)
Sodium: 141 mmol/L (ref 135–145)

## 2023-02-09 LAB — CBC
HCT: 40.2 % (ref 36.0–46.0)
Hemoglobin: 12.6 g/dL (ref 12.0–15.0)
MCH: 28.8 pg (ref 26.0–34.0)
MCHC: 31.3 g/dL (ref 30.0–36.0)
MCV: 91.8 fL (ref 80.0–100.0)
Platelets: 305 K/uL (ref 150–400)
RBC: 4.38 MIL/uL (ref 3.87–5.11)
RDW: 16.1 % — ABNORMAL HIGH (ref 11.5–15.5)
WBC: 8.3 K/uL (ref 4.0–10.5)
nRBC: 0 % (ref 0.0–0.2)

## 2023-02-11 ENCOUNTER — Encounter: Payer: Self-pay | Admitting: Adult Health

## 2023-02-11 ENCOUNTER — Non-Acute Institutional Stay (SKILLED_NURSING_FACILITY): Payer: PPO | Admitting: Adult Health

## 2023-02-11 DIAGNOSIS — G894 Chronic pain syndrome: Secondary | ICD-10-CM | POA: Diagnosis not present

## 2023-02-11 DIAGNOSIS — M62838 Other muscle spasm: Secondary | ICD-10-CM | POA: Diagnosis not present
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
## 2023-02-12 ENCOUNTER — Ambulatory Visit: Payer: PPO

## 2023-02-12 DIAGNOSIS — M62838 Other muscle spasm: Secondary | ICD-10-CM | POA: Insufficient documentation

## 2023-02-18 ENCOUNTER — Non-Acute Institutional Stay (SKILLED_NURSING_FACILITY): Payer: PPO | Admitting: Adult Health

## 2023-02-18 ENCOUNTER — Encounter: Payer: Self-pay | Admitting: Adult Health

## 2023-02-18 DIAGNOSIS — L03313 Cellulitis of chest wall: Secondary | ICD-10-CM | POA: Diagnosis not present

## 2023-02-18 NOTE — Progress Notes (Signed)
Location:  [facility] Room Number: 128 Place of Service:  SNF (31)   CODE STATUS: full   Allergies  Allergen Reactions   Epinephrine Anaphylaxis   Demerol [Meperidine] Other (See Comments)    headaches   Morphine Sulfate Anxiety    Patient states it messes with her mind makes her narcoleptic   Vicodin [Hydrocodone-Acetaminophen] Other (See Comments)    York Spaniel it makes her crazy. Pt tolerates acetaminophen    Chief Complaint  Patient presents with   Acute Visit    Pace maker site pain     HPI:  She has been complaining of pain and swelling on her pace maker area. She is taking tylenol cr four times daily. She continues to participate in therapy. Today she is unable to do therapy. There is redness present; without warmth; tenderness to palpation at site and distal to site. There are no reports of fevers present.   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8   age 89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 2 weeks   Hemorrhoids 2007   History of kidney stones    HTN (hypertension)    Hyperplastic colon polyp 2005   Hypothyroidism    Narcolepsy    PONV (postoperative nausea and vomiting)    TIA (transient ischemic attack)     Past Surgical History:  Procedure Laterality Date   ABDOMINAL HYSTERECTOMY     age 53, complicated by poor wound healing, followed by revision of scar and radiation for ?malignancy   BACK SURGERY  07/2018   lost 2 inches   BREAST ENHANCEMENT SURGERY     BREAST IMPLANT EXCHANGE Right 06/23/2016   Procedure: RIGHT BREAST IMPLANT REMOVAL AND REPLACEMENT;  Surgeon:  Louisa Second, MD;  Location: [facility];  Service: Plastics;  Laterality: Right;   COLONOSCOPY  2005   2 hyperplastic polyps   COLONOSCOPY  2007   Dr. Darrick Penna- hemorrhoids   COLONOSCOPY  2008   Dr. Steva Ready polyp- rare sigmoid diverticulosis, internal hemorrhoids   COLONOSCOPY  12/01/2011   Procedure: COLONOSCOPY;  Surgeon: West Bali, MD;  [facility];  Service: Endoscopy;  Laterality: N/A;  12:30 PM   COLONOSCOPY WITH PROPOFOL N/A 05/22/2022   Procedure: COLONOSCOPY WITH PROPOFOL;  Surgeon: Lanelle Bal, DO;  [facility]: [facility];  Service: Endoscopy;  Laterality: N/A;  1:00 PM   CYSTOSCOPY/URETEROSCOPY/HOLMIUM LASER/STENT PLACEMENT Left 02/29/2020   Procedure: CYSTOSCOPY/URETEROSCOPY/HOLMIUM LASER/STENT PLACEMENT;  Surgeon: Rene Paci, MD;  Location: [facility];  Service: Urology;  Laterality: Left;   epidural steroid injection     She is getting injections with Dr. Ethelene Hal q3wks.   ESOPHAGOGASTRODUODENOSCOPY (EGD) WITH ESOPHAGEAL DILATION N/A 04/15/2013   Procedure: ESOPHAGOGASTRODUODENOSCOPY (EGD) WITH ESOPHAGEAL DILATION;  Surgeon: West Bali, MD;  [facility];  Service: Endoscopy;  Laterality: N/A;  11:45-moved to 12:30 [name] to notify pt   EYE SURGERY  2010   EYE SURGERY     IR  KYPHO LUMBAR INC FX REDUCE BONE BX UNI/BIL CANNULATION INC/IMAGING  08/23/2018   IR RADIOLOGIST EVAL & MGMT  12/23/2018   PACEMAKER IMPLANT N/A 01/28/2023   Procedure: PACEMAKER IMPLANT;  Surgeon: Marinus Maw, MD;  [facility];  Service: Cardiovascular;  Laterality: N/A;   POLYPECTOMY  05/22/2022   Procedure: POLYPECTOMY;  Surgeon: Lanelle Bal, DO;  [facility];  Service: Endoscopy;;   right thumb surgery      Social History   Socioeconomic History   Marital status: Widowed    Spouse name: Not on file   Number of children: 1   Years of education: 63   Highest education level: 12th grade   Occupational History   Not on file  Tobacco Use   Smoking status: Never    Passive exposure: Never   Smokeless tobacco: Never  Vaping Use   Vaping status: Never Used  Substance and Sexual Activity   Alcohol use: No   Drug use: No   Sexual activity: Not Currently    Partners: Male  Other Topics Concern   Not on file  Social History Narrative   Lives alone   Right Handed    Drinks no caffeine daily   Social Determinants of Health   Financial Resource Strain: Low Risk  (02/06/2022)   Overall Financial Resource Strain (CARDIA)    Difficulty of Paying Living Expenses: Not hard at all  Food Insecurity: No Food Insecurity (01/26/2023)   Hunger Vital Sign    Worried About Running Out of Food in the Last Year: Never true    Ran Out of Food in the Last Year: Never true  Transportation Needs: No Transportation Needs (01/26/2023)   PRAPARE - Administrator, Civil Service (Medical): No    Lack of Transportation (Non-Medical): No  Physical Activity: Inactive (02/06/2022)   Exercise Vital Sign    Days of Exercise per Week: 0 days    Minutes of Exercise per Session: 0 min  Stress: No Stress Concern Present (02/06/2022)   [employer] of Occupational Health - Occupational Stress Questionnaire    Feeling of Stress : Only a little  Social Connections: Moderately Integrated (02/06/2022)   Social Connection and Isolation Panel [NHANES]    Frequency of Communication with Friends and Family: More than three times a week    Frequency of Social Gatherings with Friends and Family: More than three times a week    Attends Religious Services: More than 4 times per year    Active Member of [employer] or Organizations: Yes    Attends Banker Meetings: More than 4 times per year    Marital Status: Widowed  Intimate Partner Violence: Not At Risk (01/26/2023)   Humiliation, Afraid, Rape, and Kick questionnaire    Fear of Current or Ex-Partner: No    Emotionally Abused: No     Physically Abused: No    Sexually Abused: No   Family History  Problem Relation Age of Onset   Colon cancer Father        age 73   Prostate cancer Father    Pancreatic cancer Mother        age 73      VITAL SIGNS BP 128/75   Pulse 62   Temp 97.6 F (36.4 C)   Resp 18   Ht 5\' 2"  (1.575 m)   Wt 140 lb (63.5 kg)   SpO2 97%   BMI 25.61 kg/m   Outpatient Encounter  Medications as of 02/18/2023  Medication Sig   acetaminophen (TYLENOL) 650 MG CR tablet Take 650 mg by mouth every 6 (six) hours.   albuterol (VENTOLIN HFA) 108 (90 Base) MCG/ACT inhaler Inhale 2 puffs into the lungs every 6 (six) hours as needed for wheezing or shortness of breath.   amLODipine (NORVASC) 10 MG tablet Take 1 tablet (10 mg total) by mouth daily. For BP   apixaban (ELIQUIS) 2.5 MG TABS tablet Take 1 tablet (2.5 mg total) by mouth 2 (two) times daily.   ascorbic acid (VITAMIN C) 500 MG tablet Take 1,000 mg by mouth daily.   B Complex Vitamins (VITAMIN B COMPLEX) TABS Take 1 tablet by mouth daily.   Calcium Carb-Cholecalciferol (CALCIUM 600 + D PO) Take 1 tablet by mouth in the morning and at bedtime.   ezetimibe (ZETIA) 10 MG tablet Take 1 tablet (10 mg total) by mouth daily.   ferrous sulfate 325 (65 FE) MG tablet Take 325 mg by mouth daily with breakfast.   furosemide (LASIX) 40 MG tablet Take 1 tablet (40 mg total) by mouth daily.   gabapentin (NEURONTIN) 100 MG capsule Take 1 capsule (100 mg total) by mouth 3 (three) times daily for 14 days.   levothyroxine (SYNTHROID) 75 MCG tablet Take 75 mcg by mouth daily before breakfast.   memantine (NAMENDA) 10 MG tablet Take 1 tablet (10 mg total) by mouth 2 (two) times daily.   methocarbamol (ROBAXIN) 500 MG tablet Take 500 mg by mouth every 8 (eight) hours as needed for muscle spasms.   metoprolol tartrate (LOPRESSOR) 25 MG tablet Take 1 tablet (25 mg total) by mouth 2 (two) times daily.   Omega-3 Fatty Acids (FISH OIL) 1200 MG CAPS Take 1,200 mg by mouth  daily.   pantoprazole (PROTONIX) 40 MG tablet Take 1 tablet (40 mg total) by mouth daily.   Potassium Chloride ER 20 MEQ TBCR Take 1 tablet (20 mEq total) by mouth daily. 1 tab daily by mouth--- take while taking Lasix/furosemide   Vitamin D, Cholecalciferol, 25 MCG (1000 UT) TABS Take 1,000 Int'l Units/1.84m2 by mouth daily at 6 (six) AM.   vitamin E 180 MG (400 UNITS) capsule Take 400 Units by mouth daily.   No facility-administered encounter medications on file as of 02/18/2023.     SIGNIFICANT DIAGNOSTIC EXAMS  TODAY  01-25-23: wbc 4.6; hgb 8.1; hct 26.2; mcv 96.0 plt 145; glucose 104; bun 41; creat 2.53; k+ 4.8; na++ 138; ca 8.9; gfr 18; protein 5.8 albumin 3.5 mag 2.6 01-26-23: tsh 1.610; vitamin B 12: 1154; iron 38; tibc 300 ferritin 226 folate 29.8 01-28-23: wbc 6.2; hgb 11.7; hct 36.5; mcv 90.3 plt 253; glucose 93; bun 26; creat 1.34; k+ 3.9; na++ 140; ca 8.2; gfr 39 protein 5.7 albumin 3.4 01-30-23: hgb A1c 5.6; chol 164; ldl 87; trig 70 hdl 72 02-02-23: wbc 68; hgb 10.4; hct 32.5; mcv 89.8 plt 202; glucose 85; bun 26; creat 1.37; k+ 4.3; na++ 137; ca 8.7; gfr 38   Review of Systems  Constitutional:  Negative for malaise/fatigue.  Respiratory:  Negative for cough and shortness of breath.   Cardiovascular:  Negative for chest pain, palpitations and leg swelling.  Gastrointestinal:  Negative for abdominal pain, constipation and heartburn.  Musculoskeletal:  Positive for myalgias. Negative for back pain and joint pain.  Skin: Negative.   Neurological:  Negative for dizziness.  Psychiatric/Behavioral:  The patient is not nervous/anxious.     Physical Exam Constitutional:  General: She is not in acute distress.    Appearance: She is well-developed. She is not diaphoretic.  Neck:     Thyroid: No thyromegaly.  Cardiovascular:     Rate and Rhythm: Normal rate and regular rhythm.     Pulses: Normal pulses.     Heart sounds: Normal heart sounds.     Comments: Has pace maker Area is  red; without warmth or drainage. The area and distal area is very tender to touch. There is small amount of swelling present.  Pulmonary:     Effort: Pulmonary effort is normal. No respiratory distress.     Breath sounds: Normal breath sounds.  Abdominal:     General: Bowel sounds are normal. There is no distension.     Palpations: Abdomen is soft.     Tenderness: There is no abdominal tenderness.  Musculoskeletal:        General: Normal range of motion.     Cervical back: Neck supple.     Right lower leg: No edema.     Left lower leg: No edema.     Comments: Has some right side weakness present    Lymphadenopathy:     Cervical: No cervical adenopathy.  Skin:    General: Skin is warm and dry.  Neurological:     Mental Status: She is alert and oriented to person, place, and time.  Psychiatric:        Mood and Affect: Mood normal.       ASSESSMENT/ PLAN:  TODAY  Cellulitis of chest wall: will begin doxycycline 100 mg twice daily through 02-25-23; will continue to monitor her status. This could represent overuse issue as well; for her pain. If there is no improve will return her back to cardiology for follow up    Synthia Innocent NP [facility]  call [phone]

## 2023-02-19 ENCOUNTER — Ambulatory Visit: Payer: PPO

## 2023-02-20 ENCOUNTER — Non-Acute Institutional Stay (SKILLED_NURSING_FACILITY): Payer: PPO | Admitting: Adult Health

## 2023-02-20 ENCOUNTER — Encounter: Payer: Self-pay | Admitting: Adult Health

## 2023-02-20 DIAGNOSIS — I5032 Chronic diastolic (congestive) heart failure: Secondary | ICD-10-CM

## 2023-02-20 DIAGNOSIS — I700 Atherosclerosis of aorta: Secondary | ICD-10-CM | POA: Diagnosis not present

## 2023-02-20 DIAGNOSIS — I482 Chronic atrial fibrillation, unspecified: Secondary | ICD-10-CM | POA: Diagnosis not present

## 2023-02-20 NOTE — Progress Notes (Signed)
Location:  [facility] Room Number: 128-P Place of Service:  SNF (31)   CODE STATUS: FULL CODE  Allergies  Allergen Reactions   Epinephrine Anaphylaxis   Demerol [Meperidine] Other (See Comments)    headaches   Morphine Sulfate Anxiety    Patient states it messes with her mind makes her narcoleptic   Vicodin [Hydrocodone-Acetaminophen] Other (See Comments)    York Spaniel it makes her crazy. Pt tolerates acetaminophen    Chief Complaint  Patient presents with   Acute Visit    Care plan meeting.    HPI:  We have come together for her care plan meeting. Family present BCAT 37/50 BIMS 14/15 mood 9/30: decreased energy; trouble concentrating; nervous. She is using wheelchair without falls. She requires supervision to moderate assist with adls. She is occasionally incontinent of bladder and frequently incontinent of bowel. Dietary: regular diet; weight is 144.8 pounds; regular diet appetite 76-100%; requires setup for meals. Therapy: ambulate 150 feet with rolling walker contact guard; upper body supervision; lower body contact guard; transfers supervision to min assist; step min assist with bilateral  rails.  Activities: bible reading.  She will continue to be followed for her chronic illnesses including: Aortic atherosclerosis    Atrial fibrillation chronic     Chronic congestive heart failure  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66   age 18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 2 weeks   Hemorrhoids 2007   History of kidney stones    HTN (hypertension)    Hyperplastic colon polyp 2005    Hypothyroidism    Narcolepsy    PONV (postoperative nausea and vomiting)    TIA (transient ischemic attack)     Past Surgical History:  Procedure Laterality Date   ABDOMINAL HYSTERECTOMY     age 62, complicated by poor wound healing, followed by revision of scar and radiation for ?malignancy   BACK SURGERY  07/2018   lost 2 inches   BREAST ENHANCEMENT SURGERY     BREAST IMPLANT EXCHANGE Right 06/23/2016   Procedure: RIGHT BREAST IMPLANT REMOVAL AND REPLACEMENT;  Surgeon: Louisa Second, MD;  Location: [facility];  Service: Plastics;  Laterality: Right;   COLONOSCOPY  2005   2 hyperplastic polyps   COLONOSCOPY  2007   Dr. Darrick Penna- hemorrhoids   COLONOSCOPY  2008   Dr. Steva Ready polyp- rare sigmoid diverticulosis, internal hemorrhoids   COLONOSCOPY  12/01/2011   Procedure: COLONOSCOPY;  Surgeon: West Bali, MD;  [facility];  Service: Endoscopy;  Laterality: N/A;  12:30 PM   COLONOSCOPY WITH PROPOFOL N/A 05/22/2022   Procedure: COLONOSCOPY WITH PROPOFOL;  Surgeon: Lanelle Bal, DO;  [facility];  Service: Endoscopy;  Laterality: N/A;  1:00 PM   CYSTOSCOPY/URETEROSCOPY/HOLMIUM LASER/STENT PLACEMENT Left 02/29/2020   Procedure: CYSTOSCOPY/URETEROSCOPY/HOLMIUM LASER/STENT PLACEMENT;  Surgeon: Rene Paci, MD;  Location: [facility];  Service: Urology;  Laterality: Left;   epidural steroid injection     She is getting injections  with Dr. Lynnea Maizes.   ESOPHAGOGASTRODUODENOSCOPY (EGD) WITH ESOPHAGEAL DILATION N/A 04/15/2013   Procedure: ESOPHAGOGASTRODUODENOSCOPY (EGD) WITH ESOPHAGEAL DILATION;  Surgeon: West Bali, MD;  [facility];  Service: Endoscopy;  Laterality: N/A;  11:45-moved to 12:30 [name] to notify pt   EYE SURGERY  2010   EYE SURGERY     IR KYPHO LUMBAR INC FX REDUCE BONE BX UNI/BIL CANNULATION INC/IMAGING  08/23/2018   IR RADIOLOGIST EVAL & MGMT  12/23/2018   PACEMAKER IMPLANT N/A 01/28/2023    Procedure: PACEMAKER IMPLANT;  Surgeon: Marinus Maw, MD;  [facility];  Service: Cardiovascular;  Laterality: N/A;   POLYPECTOMY  05/22/2022   Procedure: POLYPECTOMY;  Surgeon: Lanelle Bal, DO;  [facility];  Service: Endoscopy;;   right thumb surgery      Social History   Socioeconomic History   Marital status: Widowed    Spouse name: Not on file   Number of children: 1   Years of education: 66   Highest education level: 12th grade  Occupational History   Not on file  Tobacco Use   Smoking status: Never    Passive exposure: Never   Smokeless tobacco: Never  Vaping Use   Vaping status: Never Used  Substance and Sexual Activity   Alcohol use: No   Drug use: No   Sexual activity: Not Currently    Partners: Male  Other Topics Concern   Not on file  Social History Narrative   Lives alone   Right Handed    Drinks no caffeine daily   Social Determinants of Health   Financial Resource Strain: Low Risk  (02/06/2022)   Overall Financial Resource Strain (CARDIA)    Difficulty of Paying Living Expenses: Not hard at all  Food Insecurity: No Food Insecurity (01/26/2023)   Hunger Vital Sign    Worried About Running Out of Food in the Last Year: Never true    Ran Out of Food in the Last Year: Never true  Transportation Needs: No Transportation Needs (01/26/2023)   PRAPARE - Administrator, Civil Service (Medical): No    Lack of Transportation (Non-Medical): No  Physical Activity: Inactive (02/06/2022)   Exercise Vital Sign    Days of Exercise per Week: 0 days    Minutes of Exercise per Session: 0 min  Stress: No Stress Concern Present (02/06/2022)   [employer] of Occupational Health - Occupational Stress Questionnaire    Feeling of Stress : Only a little  Social Connections: Moderately Integrated (02/06/2022)   Social Connection and Isolation Panel [NHANES]    Frequency of Communication with Friends and Family: More than three  times a week    Frequency of Social Gatherings with Friends and Family: More than three times a week    Attends Religious Services: More than 4 times per year    Active Member of [employer] or Organizations: Yes    Attends Banker Meetings: More than 4 times per year    Marital Status: Widowed  Intimate Partner Violence: Not At Risk (01/26/2023)   Humiliation, Afraid, Rape, and Kick questionnaire    Fear of Current or Ex-Partner: No    Emotionally Abused: No    Physically Abused: No    Sexually Abused: No   Family History  Problem Relation Age of Onset   Colon cancer Father        age 38   Prostate cancer Father    Pancreatic  cancer Mother        age 13      VITAL SIGNS BP 133/69   Pulse (!) 58   Temp 97.6 F (36.4 C)   Resp 20   Ht 5\' 2"  (1.575 m)   Wt 140 lb (63.5 kg)   SpO2 97%   BMI 25.61 kg/m   Outpatient Encounter Medications as of 02/20/2023  Medication Sig   acetaminophen (TYLENOL) 650 MG CR tablet Take 650 mg by mouth every 6 (six) hours.   albuterol (VENTOLIN HFA) 108 (90 Base) MCG/ACT inhaler Inhale 2 puffs into the lungs every 6 (six) hours as needed for wheezing or shortness of breath.   Amino Acids (AMINO ACID PROTEIN PO) Take 30 mLs by mouth in the morning and at bedtime.   amLODipine (NORVASC) 10 MG tablet Take 1 tablet (10 mg total) by mouth daily. For BP   apixaban (ELIQUIS) 2.5 MG TABS tablet Take 1 tablet (2.5 mg total) by mouth 2 (two) times daily.   B Complex Vitamins (VITAMIN B COMPLEX) TABS Take 1 tablet by mouth daily.   Calcium Carb-Cholecalciferol (OYSCO 500 + D) 500-5 MG-MCG TABS Take 1 tablet by mouth in the morning and at bedtime.   DOXYCYCLINE HYCLATE PO Take 100 mg by mouth in the morning and at bedtime.   DULoxetine (CYMBALTA) 30 MG capsule Take 30 mg by mouth daily.   ezetimibe (ZETIA) 10 MG tablet Take 1 tablet (10 mg total) by mouth daily.   ferrous sulfate 325 (65 FE) MG tablet Take 325 mg by mouth daily with breakfast.    furosemide (LASIX) 40 MG tablet Take 1 tablet (40 mg total) by mouth daily.   levothyroxine (SYNTHROID) 75 MCG tablet Take 75 mcg by mouth daily before breakfast.   memantine (NAMENDA) 10 MG tablet Take 1 tablet (10 mg total) by mouth 2 (two) times daily.   methocarbamol (ROBAXIN) 500 MG tablet Take 500 mg by mouth every 8 (eight) hours as needed for muscle spasms.   metoprolol tartrate (LOPRESSOR) 25 MG tablet Take 1 tablet (25 mg total) by mouth 2 (two) times daily.   Omega-3 Fatty Acids (FISH OIL) 1200 MG CAPS Take 1,200 mg by mouth daily.   pantoprazole (PROTONIX) 40 MG tablet Take 1 tablet (40 mg total) by mouth daily.   Potassium Chloride ER 20 MEQ TBCR Take 1 tablet (20 mEq total) by mouth daily. 1 tab daily by mouth--- take while taking Lasix/furosemide   Vitamin D, Cholecalciferol, 25 MCG (1000 UT) TABS Take 1,000 Int'l Units/1.94m2 by mouth daily at 6 (six) AM.   vitamin E 180 MG (400 UNITS) capsule Take 400 Units by mouth daily.   [DISCONTINUED] ascorbic acid (VITAMIN C) 500 MG tablet Take 1,000 mg by mouth daily.   [DISCONTINUED] Calcium Carb-Cholecalciferol (CALCIUM 600 + D PO) Take 1 tablet by mouth in the morning and at bedtime.   [DISCONTINUED] gabapentin (NEURONTIN) 100 MG capsule Take 1 capsule (100 mg total) by mouth 3 (three) times daily for 14 days.   No facility-administered encounter medications on file as of 02/20/2023.     SIGNIFICANT DIAGNOSTIC EXAMS  PREVIOUS   01-25-23: wbc 4.6; hgb 8.1; hct 26.2; mcv 96.0 plt 145; glucose 104; bun 41; creat 2.53; k+ 4.8; na++ 138; ca 8.9; gfr 18; protein 5.8 albumin 3.5 mag 2.6 01-26-23: tsh 1.610; vitamin B 12: 1154; iron 38; tibc 300 ferritin 226 folate 29.8 01-28-23: wbc 6.2; hgb 11.7; hct 36.5; mcv 90.3 plt 253; glucose 93; bun 26;  creat 1.34; k+ 3.9; na++ 140; ca 8.2; gfr 39 protein 5.7 albumin 3.4 01-30-23: hgb A1c 5.6; chol 164; ldl 87; trig 70 hdl 72 02-02-23: wbc 68; hgb 10.4; hct 32.5; mcv 89.8 plt 202; glucose 85; bun 26; creat  1.37; k+ 4.3; na++ 137; ca 8.7; gfr 38    Review of Systems  Constitutional:  Negative for malaise/fatigue.  Respiratory:  Negative for cough and shortness of breath.   Cardiovascular:  Negative for chest pain, palpitations and leg swelling.  Gastrointestinal:  Negative for abdominal pain, constipation and heartburn.  Musculoskeletal:  Negative for back pain, joint pain and myalgias.  Skin: Negative.   Neurological:  Negative for dizziness.  Psychiatric/Behavioral:  The patient is not nervous/anxious.    Physical Exam Constitutional:      General: She is not in acute distress.    Appearance: She is well-developed. She is not diaphoretic.  Neck:     Thyroid: No thyromegaly.  Cardiovascular:     Rate and Rhythm: Normal rate and regular rhythm.     Pulses: Normal pulses.     Heart sounds: Normal heart sounds.     Comments:  Has pace maker Area is red; without warmth or drainage. The area and distal area is very tender to touch. There is small amount of swelling present.  Pulmonary:     Effort: Pulmonary effort is normal. No respiratory distress.     Breath sounds: Normal breath sounds.  Abdominal:     General: Bowel sounds are normal. There is no distension.     Palpations: Abdomen is soft.     Tenderness: There is no abdominal tenderness.  Musculoskeletal:        General: Normal range of motion.     Cervical back: Neck supple.     Right lower leg: No edema.     Left lower leg: No edema.     Comments: Some right side weakness   Lymphadenopathy:     Cervical: No cervical adenopathy.  Skin:    General: Skin is warm and dry.  Neurological:     Mental Status: She is alert. Mental status is at baseline.     Comments: BCAT 37/50     ASSESSMENT/ PLAN:  TODAY  Aortic atherosclerosis Atrial fibrillation chronic Chronic congestive heart failure  Will continue current medications Will continue therapy as directed Will continue to monitor her status.  The goal of her care  is long term placement  Time spent with patient: 40 minutes: medications; plan of care; therapy. ( 20 minutes with advanced directives MOST form given to family to fill out)    Synthia Innocent NP [facility]  call [phone]

## 2023-02-24 ENCOUNTER — Ambulatory Visit (HOSPITAL_COMMUNITY)
Admission: RE | Admit: 2023-02-24 | Discharge: 2023-02-24 | Disposition: A | Discharge status: Home / Self Care | Payer: PPO | Source: Ambulatory Visit | Attending: Urology | Admitting: Urology

## 2023-02-24 DIAGNOSIS — N135 Crossing vessel and stricture of ureter without hydronephrosis: Secondary | ICD-10-CM | POA: Insufficient documentation

## 2023-02-24 DIAGNOSIS — N130 Hydronephrosis with ureteropelvic junction obstruction: Secondary | ICD-10-CM | POA: Diagnosis not present

## 2023-02-24 DIAGNOSIS — N2889 Other specified disorders of kidney and ureter: Secondary | ICD-10-CM | POA: Diagnosis not present

## 2023-02-25 ENCOUNTER — Ambulatory Visit: Payer: PPO | Attending: Internal Medicine

## 2023-02-25 ENCOUNTER — Ambulatory Visit: Payer: PPO

## 2023-02-25 DIAGNOSIS — I495 Sick sinus syndrome: Secondary | ICD-10-CM | POA: Diagnosis not present

## 2023-02-25 LAB — CUP PACEART INCLINIC DEVICE CHECK
Battery Remaining Longevity: 160 mo
Battery Voltage: 3.22 V
Brady Statistic AP VP Percent: 0.03 %
Brady Statistic AP VS Percent: 53.84 %
Brady Statistic AS VP Percent: 0.03 %
Brady Statistic AS VS Percent: 46.11 %
Brady Statistic RA Percent Paced: 53.88 %
Brady Statistic RV Percent Paced: 0.06 %
Date Time Interrogation Session: 20241002102207
Implantable Lead Connection Status: 753985
Implantable Lead Connection Status: 753985
Implantable Lead Implant Date: 20240904
Implantable Lead Implant Date: 20240904
Implantable Lead Location: 753859
Implantable Lead Location: 753860
Implantable Lead Model: 3830
Implantable Lead Model: 5076
Implantable Pulse Generator Implant Date: 20240904
Lead Channel Impedance Value: 323 Ohm
Lead Channel Impedance Value: 380 Ohm
Lead Channel Impedance Value: 456 Ohm
Lead Channel Impedance Value: 608 Ohm
Lead Channel Pacing Threshold Amplitude: 0.625 V
Lead Channel Pacing Threshold Amplitude: 0.75 V
Lead Channel Pacing Threshold Pulse Width: 0.4 ms
Lead Channel Pacing Threshold Pulse Width: 0.4 ms
Lead Channel Sensing Intrinsic Amplitude: 1.75 mV
Lead Channel Sensing Intrinsic Amplitude: 2.75 mV
Lead Channel Sensing Intrinsic Amplitude: 21.125 mV
Lead Channel Sensing Intrinsic Amplitude: 24.875 mV
Lead Channel Setting Pacing Amplitude: 3.5 V
Lead Channel Setting Pacing Amplitude: 3.5 V
Lead Channel Setting Pacing Pulse Width: 0.4 ms
Lead Channel Setting Sensing Sensitivity: 1.2 mV
Zone Setting Status: 755011

## 2023-02-25 NOTE — Patient Instructions (Signed)

## 2023-02-25 NOTE — Progress Notes (Signed)
Wound check appointment. Steri-strips removed. Wound without redness or edema. Stitch noted at medial incision site clipped stitch. Will have pt continue to monitor for s/sx of infection. Incision edges approximated, wound well healed. Normal device function. Thresholds, sensing, and impedances consistent with implant measurements. Device programmed at 3.5V/auto capture programmed on for extra safety margin until 3 month visit. Histogram distribution appropriate for patient and level of activity. No mode switches or high ventricular rates noted. Patient educated about wound care, arm mobility, lifting restrictions. ROV in 3 months with implanting physician.

## 2023-03-02 ENCOUNTER — Other Ambulatory Visit: Payer: Self-pay | Admitting: Adult Health

## 2023-03-02 ENCOUNTER — Encounter (HOSPITAL_COMMUNITY): Payer: Self-pay | Admitting: Adult Health

## 2023-03-02 ENCOUNTER — Non-Acute Institutional Stay (SKILLED_NURSING_FACILITY): Payer: Self-pay | Admitting: Adult Health

## 2023-03-02 ENCOUNTER — Encounter: Payer: Self-pay | Admitting: Adult Health

## 2023-03-02 ENCOUNTER — Ambulatory Visit: Payer: PPO | Admitting: Urology

## 2023-03-02 DIAGNOSIS — I130 Hypertensive heart and chronic kidney disease with heart failure and stage 1 through stage 4 chronic kidney disease, or unspecified chronic kidney disease: Secondary | ICD-10-CM | POA: Diagnosis not present

## 2023-03-02 DIAGNOSIS — I5032 Chronic diastolic (congestive) heart failure: Secondary | ICD-10-CM | POA: Diagnosis not present

## 2023-03-02 DIAGNOSIS — I482 Chronic atrial fibrillation, unspecified: Secondary | ICD-10-CM | POA: Diagnosis not present

## 2023-03-02 DIAGNOSIS — F0150 Vascular dementia without behavioral disturbance: Secondary | ICD-10-CM

## 2023-03-02 MED ORDER — APIXABAN 2.5 MG PO TABS: 2.5000 mg | ORAL_TABLET | Freq: Two times a day (BID) | ORAL | 0 refills | Status: DC

## 2023-03-02 MED ORDER — LEVOTHYROXINE SODIUM 75 MCG PO TABS: 75.0000 ug | ORAL_TABLET | Freq: Every day | ORAL | 0 refills | Status: DC

## 2023-03-02 MED ORDER — CEPHALEXIN 750 MG PO CAPS: 750.0000 mg | ORAL_CAPSULE | Freq: Two times a day (BID) | ORAL | 0 refills | Status: DC

## 2023-03-02 MED ORDER — AMLODIPINE BESYLATE 10 MG PO TABS: 10.0000 mg | ORAL_TABLET | Freq: Every day | ORAL | 0 refills | Status: DC

## 2023-03-02 MED ORDER — METOPROLOL TARTRATE 25 MG PO TABS: 25.0000 mg | ORAL_TABLET | Freq: Two times a day (BID) | ORAL | 0 refills | Status: DC

## 2023-03-02 MED ORDER — ALBUTEROL SULFATE HFA 108 (90 BASE) MCG/ACT IN AERS: 2.0000 | INHALATION_SPRAY | Freq: Four times a day (QID) | RESPIRATORY_TRACT | 0 refills | Status: DC | PRN

## 2023-03-02 MED ORDER — METHOCARBAMOL 500 MG PO TABS: 500.0000 mg | ORAL_TABLET | Freq: Three times a day (TID) | ORAL | 0 refills | Status: DC | PRN

## 2023-03-02 MED ORDER — PANTOPRAZOLE SODIUM 40 MG PO TBEC: 40.0000 mg | DELAYED_RELEASE_TABLET | Freq: Every day | ORAL | 0 refills | Status: DC

## 2023-03-02 MED ORDER — FUROSEMIDE 20 MG PO TABS: 60.0000 mg | ORAL_TABLET | Freq: Every day | ORAL | 0 refills | Status: DC

## 2023-03-02 MED ORDER — MEMANTINE HCL 10 MG PO TABS: 10.0000 mg | ORAL_TABLET | Freq: Two times a day (BID) | ORAL | 0 refills | Status: DC

## 2023-03-02 MED ORDER — POTASSIUM CHLORIDE ER 20 MEQ PO TBCR: 20.0000 meq | EXTENDED_RELEASE_TABLET | Freq: Every day | ORAL | 0 refills | Status: DC

## 2023-03-02 MED ORDER — EZETIMIBE 10 MG PO TABS: 10.0000 mg | ORAL_TABLET | Freq: Every day | ORAL | 0 refills | Status: DC

## 2023-03-02 MED ORDER — FERROUS SULFATE 325 (65 FE) MG PO TABS: 325.0000 mg | ORAL_TABLET | Freq: Every day | ORAL | 0 refills | Status: DC

## 2023-03-02 MED ORDER — DULOXETINE HCL 30 MG PO CPEP: 30.0000 mg | ORAL_CAPSULE | Freq: Every day | ORAL | 0 refills | Status: DC

## 2023-03-02 NOTE — Progress Notes (Unsigned)
Location:  [facility] Room Number: 128 Place of Service:  SNF (31)   CODE STATUS: full   Allergies  Allergen Reactions   Epinephrine Anaphylaxis   Demerol [Meperidine] Other (See Comments)    headaches   Morphine Sulfate Anxiety    Patient states it messes with her mind makes her narcoleptic   Vicodin [Hydrocodone-Acetaminophen] Other (See Comments)    York Spaniel it makes her crazy. Pt tolerates acetaminophen    Chief Complaint  Patient presents with   Discharge Note    HPI:  She is being discharged to home with home health for pt/ot/cna. She will need a wheelchair. She will need her prescriptions written and will need to follow up with her medical provider. She had been hospitalized for right side ataxia; atrial fibrillation with pace maker insertion. She was admitted to this facility for short term rehab. She has participated in pt/ot to improve upon her independence with her adls. Therapy: ambulate 200 feet with rolling walker and supervision. Upper body supervision, lower body: stand by assist. Transfers at supervision. Stand/pivot supervision. Steps: contact guard with bilateral rails. BRP stand by assist. She has mild to moderate cognitive impairment.   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63   age 89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 2 weeks   Hemorrhoids 2007   History of kidney stones    HTN (hypertension)    Hyperplastic colon polyp 2005   Hypothyroidism    Narcolepsy    PONV (postoperative nausea and vomiting)    TIA (transient ischemic attack)     Past Surgical History:   Procedure Laterality Date   ABDOMINAL HYSTERECTOMY     age 4, complicated by poor wound healing, followed by revision of scar and radiation for ?malignancy   BACK SURGERY  07/2018   lost 2 inches   BREAST ENHANCEMENT SURGERY     BREAST IMPLANT EXCHANGE Right 06/23/2016   Procedure: RIGHT BREAST IMPLANT REMOVAL AND REPLACEMENT;  Surgeon: Louisa Second, MD;  Location: [facility];  Service: Plastics;  Laterality: Right;   COLONOSCOPY  2005   2 hyperplastic polyps   COLONOSCOPY  2007   Dr. Darrick Penna- hemorrhoids   COLONOSCOPY  2008   Dr. Steva Ready polyp- rare sigmoid diverticulosis, internal hemorrhoids   COLONOSCOPY  12/01/2011   Procedure: COLONOSCOPY;  Surgeon: West Bali, MD;  Location: [facility];  Service: Endoscopy;  Laterality: N/A;  12:30 PM   COLONOSCOPY WITH PROPOFOL N/A 05/22/2022   Procedure: COLONOSCOPY WITH PROPOFOL;  Surgeon: Lanelle Bal, DO;  Location: [facility];  Service: Endoscopy;  Laterality: N/A;  1:00 PM   CYSTOSCOPY/URETEROSCOPY/HOLMIUM LASER/STENT PLACEMENT Left 02/29/2020   Procedure: CYSTOSCOPY/URETEROSCOPY/HOLMIUM LASER/STENT PLACEMENT;  Surgeon: Rene Paci, MD;  Location: [facility];  Service: Urology;  Laterality: Left;   epidural steroid injection     She is getting injections with Dr. Ethelene Hal q3wks.   ESOPHAGOGASTRODUODENOSCOPY (EGD) WITH ESOPHAGEAL DILATION N/A 04/15/2013   Procedure: ESOPHAGOGASTRODUODENOSCOPY (EGD)  WITH ESOPHAGEAL DILATION;  Surgeon: West Bali, MD;  Location: [facility];  Service: Endoscopy;  Laterality: N/A;  11:45-moved to 12:30 [name] to notify pt   EYE SURGERY  2010   EYE SURGERY     IR KYPHO LUMBAR INC FX REDUCE BONE BX UNI/BIL CANNULATION INC/IMAGING  08/23/2018   IR RADIOLOGIST EVAL & MGMT  12/23/2018   PACEMAKER IMPLANT N/A 01/28/2023   Procedure: PACEMAKER IMPLANT;  Surgeon: Marinus Maw, MD;  Location: [facility];  Service: Cardiovascular;  Laterality: N/A;    POLYPECTOMY  05/22/2022   Procedure: POLYPECTOMY;  Surgeon: Lanelle Bal, DO;  Location: [facility];  Service: Endoscopy;;   right thumb surgery      Social History   Socioeconomic History   Marital status: Widowed    Spouse name: Not on file   Number of children: 1   Years of education: 33   Highest education level: 12th grade  Occupational History   Not on file  Tobacco Use   Smoking status: Never    Passive exposure: Never   Smokeless tobacco: Never  Vaping Use   Vaping status: Never Used  Substance and Sexual Activity   Alcohol use: No   Drug use: No   Sexual activity: Not Currently    Partners: Male  Other Topics Concern   Not on file  Social History Narrative   Lives alone   Right Handed    Drinks no caffeine daily   Social Determinants of Health   Financial Resource Strain: Low Risk  (02/06/2022)   Overall Financial Resource Strain (CARDIA)    Difficulty of Paying Living Expenses: Not hard at all  Food Insecurity: No Food Insecurity (01/26/2023)   Hunger Vital Sign    Worried About Running Out of Food in the Last Year: Never true    Ran Out of Food in the Last Year: Never true  Transportation Needs: No Transportation Needs (01/26/2023)   PRAPARE - Administrator, Civil Service (Medical): No    Lack of Transportation (Non-Medical): No  Physical Activity: Inactive (02/06/2022)   Exercise Vital Sign    Days of Exercise per Week: 0 days    Minutes of Exercise per Session: 0 min  Stress: No Stress Concern Present (02/06/2022)   [employer] of Occupational Health - Occupational Stress Questionnaire    Feeling of Stress : Only a little  Social Connections: Moderately Integrated (02/06/2022)   Social Connection and Isolation Panel [NHANES]    Frequency of Communication with Friends and Family: More than three times a week    Frequency of Social Gatherings with Friends and Family: More than three times a week    Attends Religious Services: More  than 4 times per year    Active Member of [employer] or Organizations: Yes    Attends Banker Meetings: More than 4 times per year    Marital Status: Widowed  Intimate Partner Violence: Not At Risk (01/26/2023)   Humiliation, Afraid, Rape, and Kick questionnaire    Fear of Current or Ex-Partner: No    Emotionally Abused: No    Physically Abused: No    Sexually Abused: No   Family History  Problem Relation Age of Onset   Colon cancer Father        age 67   Prostate cancer Father    Pancreatic cancer Mother        age 78      VITAL SIGNS  BP 109/78   Pulse (!) 56   Temp (!) 97.4 F (36.3 C)   Resp 20   Ht 5\' 2"  (1.575 m)   Wt 137 lb 12.8 oz (62.5 kg)   SpO2 98%   BMI 25.20 kg/m   Outpatient Encounter Medications as of 03/02/2023  Medication Sig   acetaminophen (TYLENOL) 650 MG CR tablet Take 650 mg by mouth every 6 (six) hours.   albuterol (VENTOLIN HFA) 108 (90 Base) MCG/ACT inhaler Inhale 2 puffs into the lungs every 6 (six) hours as needed for wheezing or shortness of breath.   Amino Acids (AMINO ACID PROTEIN PO) Take 30 mLs by mouth in the morning and at bedtime.   amLODipine (NORVASC) 10 MG tablet Take 1 tablet (10 mg total) by mouth daily. For BP   apixaban (ELIQUIS) 2.5 MG TABS tablet Take 1 tablet (2.5 mg total) by mouth 2 (two) times daily.   B Complex Vitamins (VITAMIN B COMPLEX) TABS Take 1 tablet by mouth daily.   Calcium Carb-Cholecalciferol (OYSCO 500 + D) 500-5 MG-MCG TABS Take 1 tablet by mouth in the morning and at bedtime.   DULoxetine (CYMBALTA) 30 MG capsule Take 30 mg by mouth daily.   ezetimibe (ZETIA) 10 MG tablet Take 1 tablet (10 mg total) by mouth daily.   ferrous sulfate 325 (65 FE) MG tablet Take 325 mg by mouth daily with breakfast.   furosemide (LASIX) 40 MG tablet Take 1 tablet (40 mg total) by mouth daily.   levothyroxine (SYNTHROID) 75 MCG tablet Take 75 mcg by mouth daily before breakfast.   memantine (NAMENDA) 10 MG tablet Take 1  tablet (10 mg total) by mouth 2 (two) times daily.   methocarbamol (ROBAXIN) 500 MG tablet Take 500 mg by mouth every 8 (eight) hours as needed for muscle spasms.   metoprolol tartrate (LOPRESSOR) 25 MG tablet Take 1 tablet (25 mg total) by mouth 2 (two) times daily.   Omega-3 Fatty Acids (FISH OIL) 1200 MG CAPS Take 1,200 mg by mouth daily.   pantoprazole (PROTONIX) 40 MG tablet Take 1 tablet (40 mg total) by mouth daily.   Potassium Chloride ER 20 MEQ TBCR Take 1 tablet (20 mEq total) by mouth daily. 1 tab daily by mouth--- take while taking Lasix/furosemide   Vitamin D, Cholecalciferol, 25 MCG (1000 UT) TABS Take 1,000 Int'l Units/1.44m2 by mouth daily at 6 (six) AM.   vitamin E 180 MG (400 UNITS) capsule Take 400 Units by mouth daily.   No facility-administered encounter medications on file as of 03/02/2023.     SIGNIFICANT DIAGNOSTIC EXAMS  PREVIOUS   01-25-23: wbc 4.6; hgb 8.1; hct 26.2; mcv 96.0 plt 145; glucose 104; bun 41; creat 2.53; k+ 4.8; na++ 138; ca 8.9; gfr 18; protein 5.8 albumin 3.5 mag 2.6 01-26-23: tsh 1.610; vitamin B 12: 1154; iron 38; tibc 300 ferritin 226 folate 29.8 01-28-23: wbc 6.2; hgb 11.7; hct 36.5; mcv 90.3 plt 253; glucose 93; bun 26; creat 1.34; k+ 3.9; na++ 140; ca 8.2; gfr 39 protein 5.7 albumin 3.4 01-30-23: hgb A1c 5.6; chol 164; ldl 87; trig 70 hdl 72 02-02-23: wbc 68; hgb 10.4; hct 32.5; mcv 89.8 plt 202; glucose 85; bun 26; creat 1.37; k+ 4.3; na++ 137; ca 8.7; gfr 38    Review of Systems  Constitutional:  Negative for malaise/fatigue.  Respiratory:  Negative for cough and shortness of breath.   Cardiovascular:  Negative for chest pain, palpitations and leg swelling.  Gastrointestinal:  Negative for abdominal  pain, constipation and heartburn.  Musculoskeletal:  Negative for back pain, joint pain and myalgias.  Skin:  Positive for rash.  Neurological:  Negative for dizziness.  Psychiatric/Behavioral:  The patient is not nervous/anxious.     Physical  Exam Constitutional:      General: She is not in acute distress.    Appearance: She is well-developed. She is not diaphoretic.  Neck:     Thyroid: No thyromegaly.  Cardiovascular:     Rate and Rhythm: Normal rate and regular rhythm.     Pulses: Normal pulses.     Heart sounds: Normal heart sounds.     Comments: Pace maker  Pulmonary:     Effort: Pulmonary effort is normal. No respiratory distress.     Breath sounds: Normal breath sounds.  Abdominal:     General: Bowel sounds are normal. There is no distension.     Palpations: Abdomen is soft.     Tenderness: There is no abdominal tenderness.  Musculoskeletal:        General: Normal range of motion.     Cervical back: Neck supple.     Right lower leg: Edema present.     Left lower leg: Edema present.  Lymphadenopathy:     Cervical: No cervical adenopathy.  Skin:    General: Skin is warm and dry.     Comments: Left lower leg with redness warmth tender to touch.   Neurological:     Mental Status: She is alert. Mental status is at baseline.     Comments: BCAT 37/50   Psychiatric:        Mood and Affect: Mood normal.      ASSESSMENT/ PLAN:   Patient is being discharged with the following home health services:  pt/ot/cna: to evaluate and treat as indicated for gait balance strength adl training and adl care.   Patient is being discharged with the following durable medical equipment:  will need a wheelchair   Patient has been advised to f/u with their PCP in 1-2 weeks to for a transitions of care visit.  Social services at their facility was responsible for arranging this appointment.  Pt was provided with adequate prescriptions of noncontrolled medications to reach the scheduled appointment .  For controlled substances, a limited supply was provided as appropriate for the individual patient.  If the pt normally receives these medications from a pain clinic or has a contract with another physician, these medications should be  received from that clinic or physician only).    A 30 day supply of her prescription medications have been sent to  [facility]. She will start on keflex 750 mg be twice daily for 7 days.   Time spent with patient: 40 minutes: medications; therapy; dme.   Synthia Innocent NP [facility]  call [phone]

## 2023-03-03 ENCOUNTER — Other Ambulatory Visit: Payer: Self-pay | Admitting: Adult Health

## 2023-03-03 DIAGNOSIS — I495 Sick sinus syndrome: Secondary | ICD-10-CM | POA: Diagnosis not present

## 2023-03-03 DIAGNOSIS — R109 Unspecified abdominal pain: Secondary | ICD-10-CM | POA: Diagnosis not present

## 2023-03-03 DIAGNOSIS — F03B3 Unspecified dementia, moderate, with mood disturbance: Secondary | ICD-10-CM | POA: Diagnosis not present

## 2023-03-03 DIAGNOSIS — F0393 Unspecified dementia, unspecified severity, with mood disturbance: Secondary | ICD-10-CM | POA: Diagnosis not present

## 2023-03-03 DIAGNOSIS — I129 Hypertensive chronic kidney disease with stage 1 through stage 4 chronic kidney disease, or unspecified chronic kidney disease: Secondary | ICD-10-CM | POA: Diagnosis not present

## 2023-03-03 DIAGNOSIS — Z9189 Other specified personal risk factors, not elsewhere classified: Secondary | ICD-10-CM | POA: Diagnosis not present

## 2023-03-03 DIAGNOSIS — K219 Gastro-esophageal reflux disease without esophagitis: Secondary | ICD-10-CM | POA: Diagnosis not present

## 2023-03-03 DIAGNOSIS — D631 Anemia in chronic kidney disease: Secondary | ICD-10-CM | POA: Diagnosis not present

## 2023-03-03 DIAGNOSIS — F331 Major depressive disorder, recurrent, moderate: Secondary | ICD-10-CM | POA: Diagnosis not present

## 2023-03-03 DIAGNOSIS — R600 Localized edema: Secondary | ICD-10-CM | POA: Diagnosis not present

## 2023-03-03 DIAGNOSIS — E663 Overweight: Secondary | ICD-10-CM | POA: Insufficient documentation

## 2023-03-03 DIAGNOSIS — I480 Paroxysmal atrial fibrillation: Secondary | ICD-10-CM | POA: Diagnosis not present

## 2023-03-03 DIAGNOSIS — N1831 Chronic kidney disease, stage 3a: Secondary | ICD-10-CM | POA: Diagnosis not present

## 2023-03-03 MED ORDER — PANTOPRAZOLE SODIUM 40 MG PO TBEC: 40.0000 mg | DELAYED_RELEASE_TABLET | Freq: Every day | ORAL | 0 refills | Status: AC

## 2023-03-03 MED ORDER — METOPROLOL TARTRATE 25 MG PO TABS: 25.0000 mg | ORAL_TABLET | Freq: Two times a day (BID) | ORAL | 0 refills | Status: DC

## 2023-03-03 MED ORDER — METHOCARBAMOL 500 MG PO TABS: 500.0000 mg | ORAL_TABLET | Freq: Three times a day (TID) | ORAL | 0 refills | Status: DC | PRN

## 2023-03-03 MED ORDER — MEMANTINE HCL 10 MG PO TABS: 10.0000 mg | ORAL_TABLET | Freq: Two times a day (BID) | ORAL | 0 refills | Status: AC

## 2023-03-03 MED ORDER — CEPHALEXIN 750 MG PO CAPS: 750.0000 mg | ORAL_CAPSULE | Freq: Two times a day (BID) | ORAL | 0 refills | Status: AC

## 2023-03-03 MED ORDER — ALBUTEROL SULFATE HFA 108 (90 BASE) MCG/ACT IN AERS: 2.0000 | INHALATION_SPRAY | Freq: Four times a day (QID) | RESPIRATORY_TRACT | 0 refills | Status: AC | PRN

## 2023-03-03 MED ORDER — FUROSEMIDE 20 MG PO TABS: 60.0000 mg | ORAL_TABLET | Freq: Every day | ORAL | 0 refills | Status: AC

## 2023-03-03 MED ORDER — EZETIMIBE 10 MG PO TABS: 10.0000 mg | ORAL_TABLET | Freq: Every day | ORAL | 0 refills | Status: AC

## 2023-03-03 MED ORDER — DULOXETINE HCL 30 MG PO CPEP: 30.0000 mg | ORAL_CAPSULE | Freq: Every day | ORAL | 0 refills | Status: DC

## 2023-03-03 MED ORDER — AMLODIPINE BESYLATE 10 MG PO TABS: 10.0000 mg | ORAL_TABLET | Freq: Every day | ORAL | 0 refills | Status: DC

## 2023-03-03 MED ORDER — LEVOTHYROXINE SODIUM 75 MCG PO TABS: 75.0000 ug | ORAL_TABLET | Freq: Every day | ORAL | 0 refills | Status: DC

## 2023-03-03 MED ORDER — POTASSIUM CHLORIDE ER 20 MEQ PO TBCR: 20.0000 meq | EXTENDED_RELEASE_TABLET | Freq: Every day | ORAL | 0 refills | Status: AC

## 2023-03-03 MED ORDER — FERROUS SULFATE 325 (65 FE) MG PO TABS: 325.0000 mg | ORAL_TABLET | Freq: Every day | ORAL | 0 refills | Status: DC

## 2023-03-03 MED ORDER — APIXABAN 2.5 MG PO TABS: 2.5000 mg | ORAL_TABLET | Freq: Two times a day (BID) | ORAL | 0 refills | Status: AC

## 2023-03-04 ENCOUNTER — Ambulatory Visit: Payer: PPO

## 2023-03-10 ENCOUNTER — Telehealth: Payer: Self-pay

## 2023-03-10 DIAGNOSIS — I5032 Chronic diastolic (congestive) heart failure: Secondary | ICD-10-CM

## 2023-03-11 ENCOUNTER — Telehealth: Payer: Self-pay | Admitting: *Deleted

## 2023-03-11 DIAGNOSIS — G47419 Narcolepsy without cataplexy: Secondary | ICD-10-CM | POA: Diagnosis not present

## 2023-03-11 DIAGNOSIS — M810 Age-related osteoporosis without current pathological fracture: Secondary | ICD-10-CM | POA: Diagnosis not present

## 2023-03-11 DIAGNOSIS — N184 Chronic kidney disease, stage 4 (severe): Secondary | ICD-10-CM | POA: Diagnosis not present

## 2023-03-11 DIAGNOSIS — I700 Atherosclerosis of aorta: Secondary | ICD-10-CM | POA: Diagnosis not present

## 2023-03-11 DIAGNOSIS — E782 Mixed hyperlipidemia: Secondary | ICD-10-CM | POA: Diagnosis not present

## 2023-03-11 DIAGNOSIS — F331 Major depressive disorder, recurrent, moderate: Secondary | ICD-10-CM | POA: Diagnosis not present

## 2023-03-11 DIAGNOSIS — J4540 Moderate persistent asthma, uncomplicated: Secondary | ICD-10-CM | POA: Diagnosis not present

## 2023-03-11 DIAGNOSIS — D631 Anemia in chronic kidney disease: Secondary | ICD-10-CM | POA: Diagnosis not present

## 2023-03-11 DIAGNOSIS — F01B4 Vascular dementia, moderate, with anxiety: Secondary | ICD-10-CM | POA: Diagnosis not present

## 2023-03-11 DIAGNOSIS — G8929 Other chronic pain: Secondary | ICD-10-CM | POA: Diagnosis not present

## 2023-03-11 DIAGNOSIS — I5032 Chronic diastolic (congestive) heart failure: Secondary | ICD-10-CM | POA: Diagnosis not present

## 2023-03-11 DIAGNOSIS — N179 Acute kidney failure, unspecified: Secondary | ICD-10-CM | POA: Diagnosis not present

## 2023-03-11 DIAGNOSIS — I480 Paroxysmal atrial fibrillation: Secondary | ICD-10-CM | POA: Diagnosis not present

## 2023-03-11 DIAGNOSIS — K219 Gastro-esophageal reflux disease without esophagitis: Secondary | ICD-10-CM | POA: Diagnosis not present

## 2023-03-11 DIAGNOSIS — K649 Unspecified hemorrhoids: Secondary | ICD-10-CM | POA: Diagnosis not present

## 2023-03-11 DIAGNOSIS — K5790 Diverticulosis of intestine, part unspecified, without perforation or abscess without bleeding: Secondary | ICD-10-CM | POA: Diagnosis not present

## 2023-03-11 DIAGNOSIS — M5116 Intervertebral disc disorders with radiculopathy, lumbar region: Secondary | ICD-10-CM | POA: Diagnosis not present

## 2023-03-11 DIAGNOSIS — E039 Hypothyroidism, unspecified: Secondary | ICD-10-CM | POA: Diagnosis not present

## 2023-03-11 DIAGNOSIS — I2510 Atherosclerotic heart disease of native coronary artery without angina pectoris: Secondary | ICD-10-CM | POA: Diagnosis not present

## 2023-03-11 DIAGNOSIS — J4489 Other specified chronic obstructive pulmonary disease: Secondary | ICD-10-CM | POA: Diagnosis not present

## 2023-03-11 DIAGNOSIS — I459 Conduction disorder, unspecified: Secondary | ICD-10-CM | POA: Diagnosis not present

## 2023-03-11 DIAGNOSIS — F01B3 Vascular dementia, moderate, with mood disturbance: Secondary | ICD-10-CM | POA: Diagnosis not present

## 2023-03-11 DIAGNOSIS — B029 Zoster without complications: Secondary | ICD-10-CM | POA: Diagnosis not present

## 2023-03-11 DIAGNOSIS — I130 Hypertensive heart and chronic kidney disease with heart failure and stage 1 through stage 4 chronic kidney disease, or unspecified chronic kidney disease: Secondary | ICD-10-CM | POA: Diagnosis not present

## 2023-03-11 NOTE — Progress Notes (Signed)
Care Coordination   Note   03/11/2023 Name: [name] MRN: [id number] DOB: 18-Apr-1936  [name] is a 87 y.o. year old female who sees Margo Aye, Kathleene Hazel, MD for primary care. I reached out to Alric Ran by phone today to offer care coordination services.  [name] was given information about Care Coordination services today including:   The Care Coordination services include support from the care team which includes your Nurse Coordinator, Clinical Social Worker, or Pharmacist.  The Care Coordination team is here to help remove barriers to the health concerns and goals most important to you. Care Coordination services are voluntary, and the patient may decline or stop services at any time by request to their care team member.   Care Coordination Consent Status: Patient agreed to services and verbal consent obtained.  Brother POA Daron Offer declines need for Pharmacy at this time. Patient has bubble packs now.   Follow up plan:  Telephone appointment with care coordination team member scheduled for:  10/22  Encounter Outcome:  Patient Scheduled  [facility] Coordination Care Guide  Direct Dial: [phone]

## 2023-03-11 NOTE — Progress Notes (Signed)
Care Coordination  Outreach Note  03/11/2023 Name: [name] MRN: [id number] DOB: May 08, 1936   Care Coordination Outreach Attempts: An unsuccessful telephone outreach was attempted today to offer the patient information about available care coordination services.  Follow Up Plan:  Additional outreach attempts will be made to offer the patient care coordination information and services.   Encounter Outcome:  No Answer  Gwenevere Ghazi  Care Coordination Care Guide  Direct Dial: [phone]

## 2023-03-16 ENCOUNTER — Encounter: Payer: Self-pay | Admitting: Urology

## 2023-03-16 ENCOUNTER — Ambulatory Visit: Payer: PPO | Admitting: Urology

## 2023-03-16 DIAGNOSIS — R3915 Urgency of urination: Secondary | ICD-10-CM

## 2023-03-16 DIAGNOSIS — N135 Crossing vessel and stricture of ureter without hydronephrosis: Secondary | ICD-10-CM

## 2023-03-16 DIAGNOSIS — N200 Calculus of kidney: Secondary | ICD-10-CM

## 2023-03-16 DIAGNOSIS — N201 Calculus of ureter: Secondary | ICD-10-CM | POA: Diagnosis not present

## 2023-03-16 NOTE — Progress Notes (Signed)
03/16/2023 2:04 PM   [name] 09-30-1935 [id number]  Referring provider: Benita Stabile, MD [address] [place],  [place] [address]  No chief complaint on file.   HPI:  F/u -    1) chronic left UPJ obstruction-patient has seen Dr. Annabell Howells and then followed with Dr. Liliane Shi routinely in [place]. She saw him December 2023 - left renal ultrasound was stable with left extrarenal pelvis appearance.   She had a 2020 renal scan which showed left function of 42% with a T half of 18 minutes, and a right function of 58% with a T half of 7.1 minutes.  Patient declined surgical intervention.   Her creatinine typically runs 1.03-1.77 looking back over the last years.   2) left renal stones-she underwent a left ureteroscopy with Dr. Liliane Shi in 2021 for a 6 mm dependent left renal stone.  She underwent a CT scan contrast 11/10/2022 for abdominal pain. This did show some delayed washout on the left.  Dilation appears stable.  There were 1-2 dependent 3 to 4 mm renal pelvic stones.  She went back to emergency 11/21/2022 and CT scan again revealed 2 dependent stones in the left renal pelvis.  This looked stable to improved compared to 2021 scan prior to URS. UA was clear.  Creatinine 1.79.   C/o pain holding her stomach - "Flat on the bottom" (the SP area) and upper stomach is "swollen". She is holding her stomach. No fever, nausea, emesis, dysuria, flank pain or gross hematuria. Drinking more water p ER visit. She has "constant back pain". No change. Not constipated. She gets dizzy when she gets the pain.   Today, seen for the above. She underwent 03/13/2023 renal US which showed a left extrarenal pelvis with no dilation of the calyces. In addition, she was admitted for a fib. On blood thinner. She was seen for flank pain and underwent Aug 2024 CT angio C/A/P, CT a/p in Sep 2024 which show the left two small stone stable in the pelvis and a droopy left extrarenal pelvis. Cr 0.96 - 1.37 in  Sep 2024. She was bradycardic. She underwent a pacemaker.   She is on a  diuretic and has some urgency. No dysuria. No flank pain.    She is on eliquis. She has a history of chronic back pain, compression fracture, CVA x 3.  History of pelvic radiation for nonhealing incision/cancerous lesion after hysterectomy.   She was an Radio producer and respiratory tech.   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47   age 23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 2 weeks   Hemorrhoids 2007   History of kidney stones    HTN (hypertension)    Hyperplastic colon polyp 2005   Hypothyroidism    Narcolepsy    PONV (postoperative nausea and vomiting)    TIA (transient ischemic attack)     Surgical History: Past Surgical History:  Procedure Laterality Date   ABDOMINAL HYSTERECTOMY     age  28, complicated by poor wound healing, followed by revision of scar and radiation for ?malignancy   BACK SURGERY  07/2018   lost 2 inches   BREAST ENHANCEMENT SURGERY     BREAST IMPLANT EXCHANGE Right 06/23/2016   Procedure: RIGHT BREAST IMPLANT REMOVAL AND REPLACEMENT;  Surgeon: Louisa Second, MD;  Location: [facility];  Service: Plastics;  Laterality: Right;   COLONOSCOPY  2005   2 hyperplastic polyps   COLONOSCOPY  2007   Dr. Darrick Penna- hemorrhoids   COLONOSCOPY  2008   Dr. Steva Ready polyp- rare sigmoid diverticulosis, internal hemorrhoids   COLONOSCOPY  12/01/2011   Procedure: COLONOSCOPY;  Surgeon: West Bali, MD;  Location: [facility];  Service: Endoscopy;  Laterality: N/A;  12:30 PM   COLONOSCOPY WITH PROPOFOL N/A  05/22/2022   Procedure: COLONOSCOPY WITH PROPOFOL;  Surgeon: Lanelle Bal, DO;  Location: [facility];  Service: Endoscopy;  Laterality: N/A;  1:00 PM   CYSTOSCOPY/URETEROSCOPY/HOLMIUM LASER/STENT PLACEMENT Left 02/29/2020   Procedure: CYSTOSCOPY/URETEROSCOPY/HOLMIUM LASER/STENT PLACEMENT;  Surgeon: Rene Paci, MD;  Location: [facility];  Service: Urology;  Laterality: Left;   epidural steroid injection     She is getting injections with Dr. Ethelene Hal q3wks.   ESOPHAGOGASTRODUODENOSCOPY (EGD) WITH ESOPHAGEAL DILATION N/A 04/15/2013   Procedure: ESOPHAGOGASTRODUODENOSCOPY (EGD) WITH ESOPHAGEAL DILATION;  Surgeon: West Bali, MD;  Location: [facility];  Service: Endoscopy;  Laterality: N/A;  11:45-moved to 12:30 [name] to notify pt   EYE SURGERY  2010   EYE SURGERY     IR KYPHO LUMBAR INC FX REDUCE BONE BX UNI/BIL CANNULATION INC/IMAGING  08/23/2018   IR RADIOLOGIST EVAL & MGMT  12/23/2018   PACEMAKER IMPLANT N/A 01/28/2023   Procedure: PACEMAKER IMPLANT;  Surgeon: Marinus Maw, MD;  [facility]: [facility];  Service: Cardiovascular;  Laterality: N/A;   POLYPECTOMY  05/22/2022   Procedure: POLYPECTOMY;  Surgeon: Lanelle Bal, DO;  Location: [facility];  Service: Endoscopy;;   right thumb surgery      Home Medications:  Allergies as of 03/16/2023       Reactions   Epinephrine Anaphylaxis   Demerol [meperidine] Other (See Comments)   headaches   Morphine Sulfate Anxiety   Patient states it messes with her mind makes her narcoleptic   Vicodin [hydrocodone-acetaminophen] Other (See Comments)   York Spaniel it makes her crazy. Pt tolerates acetaminophen        Medication List        Accurate as of March 16, 2023  2:04 PM. If you have any questions, ask your nurse or doctor.          acetaminophen 650 MG CR tablet Commonly known as: TYLENOL Take 650 mg by mouth every 6 (six) hours.   albuterol 108 (90 Base) MCG/ACT inhaler Commonly known as:  VENTOLIN HFA Inhale 2 puffs into the lungs every 6 (six) hours as needed for wheezing or shortness of breath.   AMINO ACID PROTEIN PO Take 30 mLs by mouth in the morning and at bedtime.   amLODipine 10 MG tablet Commonly known as: NORVASC Take 1 tablet (10 mg total) by mouth daily. For BP   apixaban 2.5 MG Tabs tablet Commonly known as: Eliquis Take 1 tablet (2.5 mg total) by mouth 2 (two) times daily.   DULoxetine 30 MG capsule Commonly known as: CYMBALTA Take 1 capsule (30 mg total) by mouth daily.   ezetimibe 10 MG tablet Commonly known as: ZETIA Take 1 tablet (  10 mg total) by mouth daily.   ferrous sulfate 325 (65 FE) MG tablet Take 1 tablet (325 mg total) by mouth daily with breakfast.   Fish Oil 1200 MG Caps Take 1,200 mg by mouth daily.   furosemide 20 MG tablet Commonly known as: LASIX Take 3 tablets (60 mg total) by mouth daily.   levothyroxine 75 MCG tablet Commonly known as: SYNTHROID Take 1 tablet (75 mcg total) by mouth daily before breakfast.   memantine 10 MG tablet Commonly known as: Namenda Take 1 tablet (10 mg total) by mouth 2 (two) times daily.   methocarbamol 500 MG tablet Commonly known as: ROBAXIN Take 1 tablet (500 mg total) by mouth every 8 (eight) hours as needed for muscle spasms.   metoprolol tartrate 25 MG tablet Commonly known as: LOPRESSOR Take 1 tablet (25 mg total) by mouth 2 (two) times daily.   OYSCO 500 + D 500-5 MG-MCG Tabs Generic drug: Calcium Carb-Cholecalciferol Take 1 tablet by mouth in the morning and at bedtime.   pantoprazole 40 MG tablet Commonly known as: PROTONIX Take 1 tablet (40 mg total) by mouth daily.   Potassium Chloride ER 20 MEQ Tbcr Take 1 tablet (20 mEq total) by mouth daily. 1 tab daily by mouth--- take while taking Lasix/furosemide   Vitamin B Complex Tabs Take 1 tablet by mouth daily.   Vitamin D (Cholecalciferol) 25 MCG (1000 UT) Tabs Take 1,000 Int'l Units/1.59m2 by mouth daily at 6 (six)  AM.   vitamin E 180 MG (400 UNITS) capsule Take 400 Units by mouth daily.        Allergies:  Allergies  Allergen Reactions   Epinephrine Anaphylaxis   Demerol [Meperidine] Other (See Comments)    headaches   Morphine Sulfate Anxiety    Patient states it messes with her mind makes her narcoleptic   Vicodin [Hydrocodone-Acetaminophen] Other (See Comments)    York Spaniel it makes her crazy. Pt tolerates acetaminophen    Family History: Family History  Problem Relation Age of Onset   Colon cancer Father        age 71   Prostate cancer Father    Pancreatic cancer Mother        age 106    Social History:  reports that she has never smoked. She has never been exposed to tobacco smoke. She has never used smokeless tobacco. She reports that she does not drink alcohol and does not use drugs.   Physical Exam: There were no vitals taken for this visit.  Constitutional:  Alert and oriented, No acute distress. HEENT: [place] AT, moist mucus membranes.  Trachea midline, no masses. Cardiovascular: No clubbing, cyanosis, or edema. Respiratory: Normal respiratory effort, no increased work of breathing. GI: Abdomen is soft, nontender, nondistended, no abdominal masses GU: No CVA tenderness Skin: No rashes, bruises or suspicious lesions. Neurologic: Grossly intact, no focal deficits, moving all 4 extremities. Psychiatric: Normal mood and affect.  Laboratory Data: Lab Results  Component Value Date   WBC 8.3 02/09/2023   HGB 12.6 02/09/2023   HCT 40.2 02/09/2023   MCV 91.8 02/09/2023   PLT 305 02/09/2023    Lab Results  Component Value Date   CREATININE 1.28 (H) 02/09/2023    No results found for: "PSA"  No results found for: "TESTOSTERONE"  Lab Results  Component Value Date   HGBA1C 5.6 01/30/2023    Urinalysis    Component Value Date/Time   COLORURINE YELLOW 01/14/2023 1945   APPEARANCEUR CLEAR 01/14/2023 1945  LABSPEC 1.006 01/14/2023 1945   PHURINE 8.0 01/14/2023 1945    GLUCOSEU NEGATIVE 01/14/2023 1945   HGBUR NEGATIVE 01/14/2023 1945   BILIRUBINUR NEGATIVE 01/14/2023 1945   BILIRUBINUR neg 12/09/2019 0925   KETONESUR NEGATIVE 01/14/2023 1945   PROTEINUR NEGATIVE 01/14/2023 1945   UROBILINOGEN negative (A) 12/09/2019 0925   NITRITE NEGATIVE 01/14/2023 1945   LEUKOCYTESUR NEGATIVE 01/14/2023 1945    Lab Results  Component Value Date   BACTERIA NONE SEEN 01/01/2023    Pertinent Imaging: CT in 2023, 2024 x 2 and renal US 2024 - images reviewed   Results for orders placed during the hospital encounter of 11/29/03  US VenoUS Imaging Bilateral  Narrative Clinical Data: Chest pain, stroke, hypothyroidism. Evaluate for potential deep venous thrombosis. VENOUS DOPPLER OF THE BILATERAL LOWER EXTREMITIES Deep venous system patent and compressible from the groin to the popliteal fossa bilaterally. Spontaneous venous flow present with intact augmentation and evidence of respiratory phasicity. No evidence of deep venous thrombosis. IMPRESSION No evidence of deep venous thrombosis.  Provider: Jake Seats  No results found for this or any previous visit.  No results found for this or any previous visit.  Results for orders placed during the hospital encounter of 02/24/23  US RENAL  Narrative CLINICAL DATA:  History of left upj obstruction  EXAM: RENAL / URINARY TRACT ULTRASOUND COMPLETE  COMPARISON:  January 26, 2023, Oct 18, 2021  FINDINGS: Limited assessment secondary to overlapping bowel contents and inability to optimally position patient.  Right Kidney:  Renal measurements: 10.4 x 4.5 x 5.1 cm = volume: 123 mL. Echogenicity is mildly increased. No suspicious mass or hydronephrosis visualized. Benign exophytic cyst is noted measuring up to 19 mm (for which no dedicated imaging follow-up is recommended) . Cortical thinning.  Left Kidney:  Renal measurements: 8.5 x 4.3 x 4.7 cm = volume: 91 mL. Echogenicity is mildly increased.  Revisualization of a distended extrarenal pelvis. Previously described nephrolithiasis are not visualized sonographically within the pelvis. Cortical thinning.  Bladder:  Appears normal for degree of bladder distention.  Other:  None.  IMPRESSION: 1. Revisualization of a LEFT-sided distended extrarenal pelvis, similar in comparison to priors. Previously described nephrolithiasis are not visualized sonographically within the pelvis. 2. Mildly increased echogenicity of the bilateral kidneys which can be seen in the setting of medical renal disease.   Electronically Signed By: Meda Klinefelter M.D. On: 03/13/2023 13:54  No valid procedures specified. No results found for this or any previous visit.  Results for orders placed during the hospital encounter of 12/06/19  CT RENAL STONE STUDY  Narrative CLINICAL DATA:  Hematuria, history of kidney stones,, history of left partial UPJ obstruction, back pain worse on the right  EXAM: CT ABDOMEN AND PELVIS WITHOUT CONTRAST  TECHNIQUE: Multidetector CT imaging of the abdomen and pelvis was performed following the standard protocol without IV contrast.  COMPARISON:  12/21/2018  FINDINGS: Lower chest: No acute abnormality. Cardiomegaly and coronary artery calcifications.  Hepatobiliary: Multiple low-attenuation liver lesions, unchanged compared to prior examination, incompletely characterized although likely cysts or hemangiomata. No gallstones, gallbladder wall thickening, or biliary dilatation.  Pancreas: Unremarkable. No pancreatic ductal dilatation or surrounding inflammatory changes.  Spleen: Normal in size without significant abnormality.  Adrenals/Urinary Tract: Adrenal glands are unremarkable. Exophytic hemorrhagic cyst of the inferior pole of the right kidney. Tiny nonobstructive right renal calculi. Redemonstrated moderate left hydronephrosis with an enlarged renal pelvis and abrupt caliber change of the  left ureter at the ureteropelvic junction. A previously seen  6 mm calyceal calculus is now located dependently in the left renal pelvis (series 2, image 28). Bladder is unremarkable.  Stomach/Bowel: Stomach is within normal limits. Appendix is not clearly visualized and may be surgically absent. No evidence of bowel wall thickening, distention, or inflammatory changes. Sigmoid diverticulosis.  Vascular/Lymphatic: Aortic atherosclerosis. No enlarged abdominal or pelvic lymph nodes.  Reproductive: Status post hysterectomy  Other: No abdominal wall hernia or abnormality. No abdominopelvic ascites.  Musculoskeletal: No acute or significant osseous findings.  IMPRESSION: 1. Redemonstrated moderate left hydronephrosis with an enlarged renal pelvis and abrupt caliber change of the left ureter at the ureteropelvic junction, in keeping with stricture. 2. A previously seen 6 mm calyceal calculus is now located dependently in the left renal pelvis. 3. Tiny nonobstructive right renal calculi. 4. Aortic Atherosclerosis (ICD10-I70.0).   Electronically Signed By: Lauralyn Primes M.D. On: 12/06/2019 17:02   Assessment & Plan:    UPJ, left - kidney stones - stable. She gets a lot of imaging which we can use to follow her kidneys. Will see back in 1 year or sooner if any issues.   Urgency - discussed oab meds and she will hold off on that.   No follow-ups on file.  Jerilee Field, MD  [facility]  [address] [place], [place] [address] [phone]

## 2023-03-17 ENCOUNTER — Ambulatory Visit: Payer: Self-pay | Admitting: Licensed Clinical Social Worker

## 2023-03-17 LAB — URINALYSIS, ROUTINE W REFLEX MICROSCOPIC
Bilirubin, UA: NEGATIVE
Glucose, UA: NEGATIVE
Ketones, UA: NEGATIVE
Nitrite, UA: NEGATIVE
Protein,UA: NEGATIVE
RBC, UA: NEGATIVE
Specific Gravity, UA: 1.01 (ref 1.005–1.030)
Urobilinogen, Ur: 0.2 mg/dL (ref 0.2–1.0)
pH, UA: 6 (ref 5.0–7.5)

## 2023-03-17 LAB — MICROSCOPIC EXAMINATION
Bacteria, UA: NONE SEEN
RBC, Urine: NONE SEEN /[HPF] (ref 0–2)

## 2023-03-17 NOTE — Patient Outreach (Signed)
Care Coordination   Initial Visit Note   03/17/2023 Name: [name] MRN: [id number] DOB: January 16, 1936  [name] is a 87 y.o. year old female who sees Margo Aye, Kathleene Hazel, MD for primary care. I spoke with Caregiver Daron Offer by phone today.  What matters to the patients health and wellness today?  Mr. Read Drivers request assistance with personal care assistance with sister [name].     Goals Addressed             This Visit's Progress    Care Coordination       Activities and task to complete in order to accomplish goals.   [facility]-Home Aide program at [facility]-call to be placed on wait list [phone] Review private pay home care options provided and discussed Consider close family and friends to delegate caregiver task           SDOH assessments and interventions completed:  Yes  SDOH Interventions Today    Flowsheet Row Most Recent Value  SDOH Interventions   Food Insecurity Interventions Intervention Not Indicated  Housing Interventions Intervention Not Indicated  Utilities Interventions Intervention Not Indicated        Care Coordination Interventions:  Yes, provided   Interventions Today    Flowsheet Row Most Recent Value  Chronic Disease   Chronic disease during today's visit Hypertension (HTN), Other  [Mr. Shearer reports pt has memory loss.]  General Interventions   General Interventions Discussed/Reviewed Level of Care  Level of Care Personal Care Services, Assisted Living  Nutrition Interventions   Nutrition Discussed/Reviewed Nutrition Discussed  [Mr. Shearer reports Ms. Defoor does not have an appetite and eats sandwiches and ensure. Ms. Pouliot does not eat big meals]  Safety Interventions   Safety Discussed/Reviewed Safety Discussed  [Mr. Shearer reports Ms. Masur still drives and he is worried about her driving.]        Follow up plan: Follow up call scheduled for 03/25/2023    Encounter  Outcome:  Patient Visit Completed   Gwyndolyn Saxon MSW, LCSW Licensed Clinical Social Worker  [facility], Population Health Direct Dial: [phone]  Fax: [phone]

## 2023-03-19 ENCOUNTER — Ambulatory Visit: Payer: PPO | Admitting: Gastroenterology

## 2023-03-25 ENCOUNTER — Ambulatory Visit: Payer: Self-pay | Admitting: Licensed Clinical Social Worker

## 2023-03-26 NOTE — Patient Outreach (Signed)
Care Coordination   Follow Up Visit Note   03/25/2023 Name: [name] MRN: [id number] DOB: March 10, 1936  [name] is a 87 y.o. year old female who sees Margo Aye, Kathleene Hazel, MD for primary care. I spoke with  [name] and patient's [name] phone today.  What matters to the patients health and wellness today?  LCSW A. Felton Clinton spk with pt [name] and Cynthia [name]. [name] reports she does not want an in home aide or placement. [name] reports she handles ADL's well and does not require assistance.     Goals Addressed             This Visit's Progress    COMPLETED: Care Coordination       Activities and task to complete in order to accomplish goals.   In-Home Aide program at [facility]-call to be placed on wait list [phone] Review private pay home care options provided and discussed Consider close family and friends to delegate caregiver task           SDOH assessments and interventions completed:  Yes  SDOH Interventions Today    Flowsheet Row Most Recent Value  SDOH Interventions   Food Insecurity Interventions Intervention Not Indicated  Housing Interventions Intervention Not Indicated  Utilities Interventions Intervention Not Indicated        Care Coordination Interventions:  No, not indicated     Follow up plan: No further intervention required.   Encounter Outcome:  Patient Visit Completed   Gwyndolyn Saxon MSW, LCSW Licensed Clinical Social Worker  [facility], Population Health Direct Dial: [phone]  Fax: [phone]

## 2023-03-29 ENCOUNTER — Other Ambulatory Visit: Payer: Self-pay | Admitting: Internal Medicine

## 2023-04-03 DIAGNOSIS — E782 Mixed hyperlipidemia: Secondary | ICD-10-CM | POA: Diagnosis not present

## 2023-04-03 DIAGNOSIS — E039 Hypothyroidism, unspecified: Secondary | ICD-10-CM | POA: Diagnosis not present

## 2023-04-09 DIAGNOSIS — F03B3 Unspecified dementia, moderate, with mood disturbance: Secondary | ICD-10-CM | POA: Diagnosis not present

## 2023-04-09 DIAGNOSIS — F331 Major depressive disorder, recurrent, moderate: Secondary | ICD-10-CM | POA: Diagnosis not present

## 2023-04-09 DIAGNOSIS — Z23 Encounter for immunization: Secondary | ICD-10-CM | POA: Diagnosis not present

## 2023-04-09 DIAGNOSIS — I480 Paroxysmal atrial fibrillation: Secondary | ICD-10-CM | POA: Diagnosis not present

## 2023-04-09 DIAGNOSIS — Z8673 Personal history of transient ischemic attack (TIA), and cerebral infarction without residual deficits: Secondary | ICD-10-CM | POA: Diagnosis not present

## 2023-04-09 DIAGNOSIS — I130 Hypertensive heart and chronic kidney disease with heart failure and stage 1 through stage 4 chronic kidney disease, or unspecified chronic kidney disease: Secondary | ICD-10-CM | POA: Diagnosis not present

## 2023-04-09 DIAGNOSIS — R600 Localized edema: Secondary | ICD-10-CM | POA: Diagnosis not present

## 2023-04-09 DIAGNOSIS — I495 Sick sinus syndrome: Secondary | ICD-10-CM | POA: Diagnosis not present

## 2023-04-09 DIAGNOSIS — I5032 Chronic diastolic (congestive) heart failure: Secondary | ICD-10-CM | POA: Diagnosis not present

## 2023-04-09 DIAGNOSIS — G250 Essential tremor: Secondary | ICD-10-CM | POA: Diagnosis not present

## 2023-04-09 DIAGNOSIS — F0393 Unspecified dementia, unspecified severity, with mood disturbance: Secondary | ICD-10-CM | POA: Diagnosis not present

## 2023-04-09 DIAGNOSIS — N1831 Chronic kidney disease, stage 3a: Secondary | ICD-10-CM | POA: Diagnosis not present

## 2023-04-17 DIAGNOSIS — J4530 Mild persistent asthma, uncomplicated: Secondary | ICD-10-CM | POA: Insufficient documentation

## 2023-04-17 DIAGNOSIS — H1011 Acute atopic conjunctivitis, right eye: Secondary | ICD-10-CM | POA: Diagnosis not present

## 2023-04-17 DIAGNOSIS — Z973 Presence of spectacles and contact lenses: Secondary | ICD-10-CM | POA: Diagnosis not present

## 2023-04-17 DIAGNOSIS — J4541 Moderate persistent asthma with (acute) exacerbation: Secondary | ICD-10-CM | POA: Diagnosis not present

## 2023-04-17 DIAGNOSIS — L853 Xerosis cutis: Secondary | ICD-10-CM | POA: Diagnosis not present

## 2023-05-01 ENCOUNTER — Ambulatory Visit (INDEPENDENT_AMBULATORY_CARE_PROVIDER_SITE_OTHER): Payer: PPO

## 2023-05-01 DIAGNOSIS — I495 Sick sinus syndrome: Secondary | ICD-10-CM | POA: Diagnosis not present

## 2023-05-03 LAB — CUP PACEART REMOTE DEVICE CHECK
Battery Remaining Longevity: 170 mo
Battery Voltage: 3.21 V
Brady Statistic AP VP Percent: 0.03 %
Brady Statistic AP VS Percent: 57.91 %
Brady Statistic AS VP Percent: 0.02 %
Brady Statistic AS VS Percent: 42.04 %
Brady Statistic RA Percent Paced: 57.98 %
Brady Statistic RV Percent Paced: 0.05 %
Date Time Interrogation Session: 20241206130114
Implantable Lead Connection Status: 753985
Implantable Lead Connection Status: 753985
Implantable Lead Implant Date: 20240904
Implantable Lead Implant Date: 20240904
Implantable Lead Location: 753859
Implantable Lead Location: 753860
Implantable Lead Model: 3830
Implantable Lead Model: 5076
Implantable Pulse Generator Implant Date: 20240904
Lead Channel Impedance Value: 304 Ohm
Lead Channel Impedance Value: 323 Ohm
Lead Channel Impedance Value: 418 Ohm
Lead Channel Impedance Value: 532 Ohm
Lead Channel Pacing Threshold Amplitude: 0.5 V
Lead Channel Pacing Threshold Amplitude: 0.875 V
Lead Channel Pacing Threshold Pulse Width: 0.4 ms
Lead Channel Pacing Threshold Pulse Width: 0.4 ms
Lead Channel Sensing Intrinsic Amplitude: 2.375 mV
Lead Channel Sensing Intrinsic Amplitude: 2.375 mV
Lead Channel Sensing Intrinsic Amplitude: 28.5 mV
Lead Channel Sensing Intrinsic Amplitude: 28.5 mV
Lead Channel Setting Pacing Amplitude: 1.5 V
Lead Channel Setting Pacing Amplitude: 2 V
Lead Channel Setting Pacing Pulse Width: 0.4 ms
Lead Channel Setting Sensing Sensitivity: 1.2 mV
Zone Setting Status: 755011

## 2023-05-05 ENCOUNTER — Encounter: Payer: Self-pay | Admitting: Internal Medicine

## 2023-05-05 ENCOUNTER — Ambulatory Visit: Payer: PPO | Attending: Internal Medicine | Admitting: Internal Medicine

## 2023-05-05 VITALS — BP 128/68 | HR 60 | Ht 60.0 in | Wt 127.2 lb

## 2023-05-05 DIAGNOSIS — I495 Sick sinus syndrome: Secondary | ICD-10-CM | POA: Diagnosis not present

## 2023-05-05 LAB — CUP PACEART INCLINIC DEVICE CHECK
Battery Remaining Longevity: 171 mo
Battery Voltage: 3.21 V
Brady Statistic AP VP Percent: 0.05 %
Brady Statistic AP VS Percent: 54.56 %
Brady Statistic AS VP Percent: 0.01 %
Brady Statistic AS VS Percent: 45.37 %
Brady Statistic RA Percent Paced: 54.63 %
Brady Statistic RV Percent Paced: 0.07 %
Date Time Interrogation Session: 20241210101119
Implantable Lead Connection Status: 753985
Implantable Lead Connection Status: 753985
Implantable Lead Implant Date: 20240904
Implantable Lead Implant Date: 20240904
Implantable Lead Location: 753859
Implantable Lead Location: 753860
Implantable Lead Model: 3830
Implantable Lead Model: 5076
Implantable Pulse Generator Implant Date: 20240904
Lead Channel Impedance Value: 342 Ohm
Lead Channel Impedance Value: 342 Ohm
Lead Channel Impedance Value: 456 Ohm
Lead Channel Impedance Value: 570 Ohm
Lead Channel Pacing Threshold Amplitude: 0.5 V
Lead Channel Pacing Threshold Amplitude: 0.875 V
Lead Channel Pacing Threshold Pulse Width: 0.4 ms
Lead Channel Pacing Threshold Pulse Width: 0.4 ms
Lead Channel Sensing Intrinsic Amplitude: 2.5 mV
Lead Channel Sensing Intrinsic Amplitude: 2.625 mV
Lead Channel Sensing Intrinsic Amplitude: 28.625 mV
Lead Channel Sensing Intrinsic Amplitude: 31.625 mV
Lead Channel Setting Pacing Amplitude: 1.5 V
Lead Channel Setting Pacing Amplitude: 2 V
Lead Channel Setting Pacing Pulse Width: 0.4 ms
Lead Channel Setting Sensing Sensitivity: 1.2 mV
Zone Setting Status: 755011

## 2023-05-05 MED ORDER — METOPROLOL TARTRATE 50 MG PO TABS: 50.0000 mg | ORAL_TABLET | Freq: Two times a day (BID) | ORAL | 3 refills | Status: DC

## 2023-05-05 NOTE — Progress Notes (Signed)
HPI Ms. Sangha returns for ongoing evaluation of sinus node dysfunction s/p PPM insertion. She also has PAF and AT. She has occasional palpitations which can be bothersome to her. She has been better on flecainide. When she goes out of rhythm she has an RVR.  Allergies  Allergen Reactions   Epinephrine Anaphylaxis   Demerol [Meperidine] Other (See Comments)    headaches   Morphine Sulfate Anxiety    Patient states it messes with her mind makes her narcoleptic   Vicodin [Hydrocodone-Acetaminophen] Other (See Comments)    York Spaniel it makes her crazy. Pt tolerates acetaminophen     Current Outpatient Medications  Medication Sig Dispense Refill   acetaminophen (TYLENOL) 650 MG CR tablet Take 650 mg by mouth every 6 (six) hours.     albuterol (VENTOLIN HFA) 108 (90 Base) MCG/ACT inhaler Inhale 2 puffs into the lungs every 6 (six) hours as needed for wheezing or shortness of breath. 18 g 0   Amino Acids (AMINO ACID PROTEIN PO) Take 30 mLs by mouth in the morning and at bedtime.     apixaban (ELIQUIS) 2.5 MG TABS tablet Take 1 tablet (2.5 mg total) by mouth 2 (two) times daily. 60 tablet 0   B Complex Vitamins (VITAMIN B COMPLEX) TABS Take 1 tablet by mouth daily.     Calcium Carb-Cholecalciferol (OYSCO 500 + D) 500-5 MG-MCG TABS Take 1 tablet by mouth in the morning and at bedtime.     DULoxetine (CYMBALTA) 30 MG capsule Take 1 capsule (30 mg total) by mouth daily. 30 capsule 0   ezetimibe (ZETIA) 10 MG tablet Take 1 tablet (10 mg total) by mouth daily. 30 tablet 0   ferrous sulfate 325 (65 FE) MG tablet Take 1 tablet (325 mg total) by mouth daily with breakfast. 30 tablet 0   furosemide (LASIX) 20 MG tablet Take 3 tablets (60 mg total) by mouth daily. 90 tablet 0   levothyroxine (SYNTHROID) 75 MCG tablet Take 1 tablet (75 mcg total) by mouth daily before breakfast. 30 tablet 0   memantine (NAMENDA) 10 MG tablet Take 1 tablet (10 mg total) by mouth 2 (two) times daily. 60 tablet 0    methocarbamol (ROBAXIN) 500 MG tablet Take 1 tablet (500 mg total) by mouth every 8 (eight) hours as needed for muscle spasms. 30 tablet 0   metoprolol tartrate (LOPRESSOR) 50 MG tablet Take 1 tablet (50 mg total) by mouth 2 (two) times daily. 180 tablet 3   Omega-3 Fatty Acids (FISH OIL) 1200 MG CAPS Take 1,200 mg by mouth daily.     pantoprazole (PROTONIX) 40 MG tablet Take 1 tablet (40 mg total) by mouth daily. 30 tablet 0   Potassium Chloride ER 20 MEQ TBCR Take 1 tablet (20 mEq total) by mouth daily. 1 tab daily by mouth--- take while taking Lasix/furosemide 30 tablet 0   Vitamin D, Cholecalciferol, 25 MCG (1000 UT) TABS Take 1,000 Int'l Units/1.64m2 by mouth daily at 6 (six) AM.     vitamin E 180 MG (400 UNITS) capsule Take 400 Units by mouth daily.     No current facility-administered medications for this visit.     Past Medical History:  Diagnosis Date   Adenomatous colon polyp 2008   Anxiety    Asthma    has not needed in over a year   Complication of anesthesia    pt reports TIA and sezures after surgery   Coronary artery disease    CVA (cerebral  vascular accident) (HCC)    x 3   Degenerative disc disease, lumbar    Depression    Diverticulosis    Dysrhythmia    GERD (gastroesophageal reflux disease)    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79   age 55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 2 weeks   Hemorrhoids 2007   History of kidney stones    HTN (hypertension)    Hyperplastic colon polyp 2005   Hypothyroidism    Narcolepsy    PONV (postoperative nausea and vomiting)    TIA (transient ischemic attack)     ROS:   All systems reviewed and negative except as noted in the HPI.   Past Surgical History:  Procedure Laterality Date   ABDOMINAL HYSTERECTOMY     age 56, complicated by poor wound healing, followed by revision of scar and radiation for ?malignancy   BACK SURGERY  07/2018   lost 2 inches   BREAST ENHANCEMENT SURGERY     BREAST IMPLANT EXCHANGE Right 06/23/2016   Procedure: RIGHT BREAST  IMPLANT REMOVAL AND REPLACEMENT;  Surgeon: Louisa Second, MD;  Location: [facility];  Service: Plastics;  Laterality: Right;   COLONOSCOPY  2005   2 hyperplastic polyps   COLONOSCOPY  2007   Dr. Darrick Penna- hemorrhoids   COLONOSCOPY  2008   Dr. Steva Ready polyp- rare sigmoid diverticulosis, internal hemorrhoids   COLONOSCOPY  12/01/2011   Procedure: COLONOSCOPY;  Surgeon: West Bali, MD;  [facility]: [facility];  Service: Endoscopy;  Laterality: N/A;  12:30 PM   COLONOSCOPY WITH PROPOFOL N/A 05/22/2022   Procedure: COLONOSCOPY WITH PROPOFOL;  Surgeon: Lanelle Bal, DO;  [facility]: [facility];  Service: Endoscopy;  Laterality: N/A;  1:00 PM   CYSTOSCOPY/URETEROSCOPY/HOLMIUM LASER/STENT PLACEMENT Left 02/29/2020   Procedure: CYSTOSCOPY/URETEROSCOPY/HOLMIUM LASER/STENT PLACEMENT;  Surgeon: Rene Paci, MD;  Location: [facility];  Service: Urology;  Laterality: Left;   epidural steroid injection     She is getting injections with Dr. Ethelene Hal q3wks.   ESOPHAGOGASTRODUODENOSCOPY (EGD) WITH ESOPHAGEAL DILATION N/A 04/15/2013   Procedure: ESOPHAGOGASTRODUODENOSCOPY (EGD) WITH ESOPHAGEAL DILATION;  Surgeon: West Bali, MD;  [facility];  Service: Endoscopy;  Laterality: N/A;  11:45-moved to 12:30 [name] to notify pt   EYE SURGERY  2010   EYE SURGERY     IR KYPHO LUMBAR INC FX REDUCE BONE BX UNI/BIL CANNULATION INC/IMAGING  08/23/2018   IR RADIOLOGIST EVAL & MGMT  12/23/2018   PACEMAKER IMPLANT N/A 01/28/2023   Procedure: PACEMAKER IMPLANT;  Surgeon: Marinus Maw, MD;  [facility]: [facility];  Service: Cardiovascular;  Laterality: N/A;   POLYPECTOMY  05/22/2022   Procedure: POLYPECTOMY;  Surgeon: Lanelle Bal, DO;  Location: [facility];  Service: Endoscopy;;   right thumb surgery       Family History  Problem Relation Age of Onset   Colon cancer Father        age 59   Prostate cancer Father    Pancreatic cancer Mother         age 57     Social History   Socioeconomic History   Marital status: Widowed    Spouse name: Not on file   Number of children: 1   Years of education: 53   Highest education level: 12th grade  Occupational History   Not on file  Tobacco Use   Smoking status: Never    Passive exposure: Never   Smokeless tobacco: Never  Vaping Use  Vaping status: Never Used  Substance and Sexual Activity   Alcohol use: No   Drug use: No   Sexual activity: Not Currently    Partners: Male  Other Topics Concern   Not on file  Social History Narrative   Lives alone   Right Handed    Drinks no caffeine daily   Social Determinants of Health   Financial Resource Strain: Low Risk  (02/06/2022)   Overall Financial Resource Strain (CARDIA)    Difficulty of Paying Living Expenses: Not hard at all  Food Insecurity: No Food Insecurity (03/25/2023)   Hunger Vital Sign    Worried About Running Out of Food in the Last Year: Never true    Ran Out of Food in the Last Year: Never true  Transportation Needs: No Transportation Needs (01/26/2023)   PRAPARE - Administrator, Civil Service (Medical): No    Lack of Transportation (Non-Medical): No  Physical Activity: Inactive (02/06/2022)   Exercise Vital Sign    Days of Exercise per Week: 0 days    Minutes of Exercise per Session: 0 min  Stress: No Stress Concern Present (02/06/2022)   [employer] of Occupational Health - Occupational Stress Questionnaire    Feeling of Stress : Only a little  Social Connections: Moderately Integrated (02/06/2022)   Social Connection and Isolation Panel [NHANES]    Frequency of Communication with Friends and Family: More than three times a week    Frequency of Social Gatherings with Friends and Family: More than three times a week    Attends Religious Services: More than 4 times per year    Active Member of [employer] or Organizations: Yes    Attends Banker Meetings: More than 4 times per year     Marital Status: Widowed  Intimate Partner Violence: Not At Risk (01/26/2023)   Humiliation, Afraid, Rape, and Kick questionnaire    Fear of Current or Ex-Partner: No    Emotionally Abused: No    Physically Abused: No    Sexually Abused: No     BP 128/68   Pulse 60   Ht 5' (1.524 m)   Wt 127 lb 3.2 oz (57.7 kg)   SpO2 98%   BMI 24.84 kg/m   Physical Exam:  Well appearing elderly woman, NAD HEENT: Unremarkable Neck:  No JVD, no thyromegally Lymphatics:  No adenopathy Back:  No CVA tenderness Lungs:  Clear with no wheezes HEART:  Regular rate rhythm, no murmurs, no rubs, no clicks Abd:  soft, positive bowel sounds, no organomegally, no rebound, no guarding Ext:  2 plus pulses, no edema, no cyanosis, no clubbing Skin:  No rashes no nodules Neuro:  CN II through XII intact, motor grossly intact  EKG - NSR with atrial pacing  DEVICE  Normal device function.  See PaceArt for details.   Assess/Plan: PAF - I asked her to increase her dose of metoprolol. HTN - I asked her to stop the amlodipine and we will reassess the need for more HTN meds while on a higher dose of the beta blocker. PPM - her Medtronic DDD PPM is working normally. No change.  Coags - she has not had any bleeding on eliquis. Continue.  Sharlot Gowda Deryck Hippler,MD

## 2023-05-05 NOTE — Patient Instructions (Signed)
Medication Instructions:  Your physician has recommended you make the following change in your medication:  On January 10th 2025 -  Stop amlodipine.  Increase lopressor 50mg  twice daily.  Lab Work: None ordered.  If you have labs (blood work) drawn today and your tests are completely normal, you will receive your results only by: MyChart Message (if you have MyChart) OR A paper copy in the mail If you have any lab test that is abnormal or we need to change your treatment, we will call you to review the results.  Testing/Procedures: None ordered.  Follow-Up: At [facility], you and your health needs are our priority.  As part of our continuing mission to provide you with exceptional heart care, we have created designated Provider Care Teams.  These Care Teams include your primary Cardiologist (physician) and Advanced Practice Providers (APPs -  Physician Assistants and Nurse Practitioners) who all work together to provide you with the care you need, when you need it.   Your next appointment:   1 year(s)  The format for your next appointment:   In Person  Provider:   Lewayne Bunting, MD{or one of the following Advanced Practice Providers on your designated Care Team:   Francis Dowse, [place] Casimiro Needle "Mardelle Matte" [place], [place] Earnest Rosier, NP  Remote monitoring is used to monitor your Pacemaker/ ICD from home. This monitoring reduces the number of office visits required to check your device to one time per year. It allows [place] to keep an eye on the functioning of your device to ensure it is working properly.  Important Information About Sugar

## 2023-06-01 ENCOUNTER — Other Ambulatory Visit: Payer: Self-pay | Admitting: Pharmacy Technician

## 2023-06-02 ENCOUNTER — Telehealth: Payer: Self-pay

## 2023-06-02 NOTE — Telephone Encounter (Signed)
 Auth Submission: NO AUTH NEEDED Site of care: Site of care: [facility] Payer: health team advtg ppo Medication & CPT/J Code(s) submitted: Prolia  (Denosumab ) [id number] Route of submission (phone, fax, portal): phone Phone # Fax # Auth type: Buy/Bill PB Units/visits requested: 60mg , q53months Reference number: [id number] Approval from: 06/02/23 to 05/25/24

## 2023-06-04 DIAGNOSIS — N201 Calculus of ureter: Secondary | ICD-10-CM | POA: Diagnosis not present

## 2023-06-10 DIAGNOSIS — G8929 Other chronic pain: Secondary | ICD-10-CM | POA: Diagnosis not present

## 2023-06-10 DIAGNOSIS — N1831 Chronic kidney disease, stage 3a: Secondary | ICD-10-CM | POA: Diagnosis not present

## 2023-06-10 DIAGNOSIS — I10 Essential (primary) hypertension: Secondary | ICD-10-CM | POA: Diagnosis not present

## 2023-06-10 DIAGNOSIS — Z79890 Hormone replacement therapy (postmenopausal): Secondary | ICD-10-CM | POA: Diagnosis not present

## 2023-06-10 DIAGNOSIS — Z713 Dietary counseling and surveillance: Secondary | ICD-10-CM | POA: Diagnosis not present

## 2023-06-10 DIAGNOSIS — Z7901 Long term (current) use of anticoagulants: Secondary | ICD-10-CM | POA: Diagnosis not present

## 2023-06-10 DIAGNOSIS — M545 Low back pain, unspecified: Secondary | ICD-10-CM | POA: Diagnosis not present

## 2023-06-10 DIAGNOSIS — Z79899 Other long term (current) drug therapy: Secondary | ICD-10-CM | POA: Diagnosis not present

## 2023-06-10 DIAGNOSIS — I480 Paroxysmal atrial fibrillation: Secondary | ICD-10-CM | POA: Diagnosis not present

## 2023-06-10 DIAGNOSIS — N130 Hydronephrosis with ureteropelvic junction obstruction: Secondary | ICD-10-CM | POA: Diagnosis not present

## 2023-06-10 DIAGNOSIS — E039 Hypothyroidism, unspecified: Secondary | ICD-10-CM | POA: Diagnosis not present

## 2023-06-10 DIAGNOSIS — I5032 Chronic diastolic (congestive) heart failure: Secondary | ICD-10-CM | POA: Diagnosis not present

## 2023-06-25 ENCOUNTER — Other Ambulatory Visit: Payer: Self-pay | Admitting: Urology

## 2023-06-25 ENCOUNTER — Telehealth: Payer: Self-pay | Admitting: Internal Medicine

## 2023-06-25 NOTE — Telephone Encounter (Signed)
   Pre-operative Risk Assessment    Patient Name: [name]  DOB: 07/09/1935 MRN: [id number]   Date of last office visit: 05/05/23 Date of next office visit: N/A   Request for Surgical Clearance    Procedure:   Left Ureteroscopy with Laser and Stint Placement  Date of Surgery:  Clearance 07/08/23                                Surgeon:  Dr. Rhoderick Moody Surgeon's Group or Practice Name:  [facility]  Phone number:  [phone] Ext. [phone] Fax number:  [phone]   Type of Clearance Requested:   - Medical  - Pharmacy:  Hold Apixaban (Eliquis) 2 days prior   Type of Anesthesia:  General    Additional requests/questions:  Please fax a copy of medical clearance to the surgeon's office.  Mardelle Matte   06/25/2023, 10:36 AM

## 2023-06-26 NOTE — Telephone Encounter (Signed)
Patient with diagnosis of A Fib on Eliquis for anticoagulation.    Procedure: Left Ureteroscopy with Laser and Stent Placement  Date of procedure: 07/08/23   CHA2DS2-VASc Score = 8  This indicates a 10.8% annual risk of stroke. The patient's score is based upon: CHF History: 1 HTN History: 1 Diabetes History: 0 Stroke History: 2 Vascular Disease History: 1 Age Score: 2 Gender Score: 1   CrCl 32 ml/min Platelet count 263K  Patient with high risk score and history of CVA. Will route to Dr Wyline Mood to see if 3 day Eliquis hold is acceptable.    **This guidance is not considered finalized until pre-operative APP has relayed final recommendations.**

## 2023-06-29 ENCOUNTER — Other Ambulatory Visit: Payer: Self-pay

## 2023-06-30 NOTE — Telephone Encounter (Signed)
 Dr. Waddell, [name] is requesting preoperative cardiac evaluation for left ureteroscopy with laser and stent placement.  Procedure is scheduled for 07/08/2023.  She was recently seen by you in clinic in December.  Which be able to comment on cardiac risk for her upcoming surgery?  Thank you for your help.  Please direct your response to CV DIV preop pool.  [name]. Cynthia Vespa NP-C     06/30/2023, 8:07 AM [facility] [address] Office [phone] Fax [phone]

## 2023-07-01 ENCOUNTER — Telehealth: Payer: Self-pay | Admitting: Internal Medicine

## 2023-07-01 ENCOUNTER — Encounter: Payer: Self-pay | Admitting: Internal Medicine

## 2023-07-01 DIAGNOSIS — I495 Sick sinus syndrome: Secondary | ICD-10-CM

## 2023-07-01 MED ORDER — METOPROLOL TARTRATE 50 MG PO TABS: 50.0000 mg | ORAL_TABLET | Freq: Two times a day (BID) | ORAL | 3 refills | Status: AC

## 2023-07-01 NOTE — Telephone Encounter (Signed)
*  STAT* If patient is at the pharmacy, call can be transferred to refill team.   1. Which medications need to be refilled? (please list name of each medication and dose if known) Metoprolol  Tartrate 50 MG   2. Would you like to learn more about the convenience, safety, & potential cost savings by using the [facility]? No   3. Are you open to using the [facility] (Type [facility]. ) No   4. Which pharmacy/location (including street and city if local pharmacy) is medication to be sent to? [facility] [place], [place] - [address]    5. Do they need a 30 day or 90 day supply? 30 day   Pt's brother would like a c/b from nurse regarding pt's medications as well.

## 2023-07-01 NOTE — Progress Notes (Signed)
 PERIOPERATIVE PRESCRIPTION FOR IMPLANTED CARDIAC DEVICE PROGRAMMING  Patient Information: Name:  [name]  DOB:  12-26-1935  MRN:  [id number]  Planned Procedure:  CYSTOSCOPY/LEFT RETROGRADE PYELOGRAM/URETEROSCOPY/HOLMIUM LASER/STENT PLACEMENT - Left  Surgeon:  Dr. Wonda Winter  Date of Procedure:  07/08/2023  Cautery will be used.  Position during surgery:     Device Information:  Clinic EP Physician:  Danelle Birmingham, MD   Device Type:  Pacemaker Manufacturer and Phone #:  Medtronic: [phone] Pacemaker Dependent?:  No. Date of Last Device Check:  05/05/23 Normal Device Function?:  Yes.    Electrophysiologist's Recommendations:  Have magnet available. Provide continuous ECG monitoring when magnet is used or reprogramming is to be performed.  Procedure may interfere with device function.  Magnet should be placed over device during procedure.  Per Device Clinic Standing Orders, [name], RN  2:23 PM 07/01/2023

## 2023-07-01 NOTE — Telephone Encounter (Signed)
 Ms. Braver is an acceptable surgical candidate. She may hold her eliquis  for up to 3 days before the procedure. Restart when the bleeding risk is acceptable.

## 2023-07-01 NOTE — Patient Instructions (Signed)
 DUE TO COVID-19 ONLY TWO VISITORS  (aged 86 and older)  ARE ALLOWED TO COME WITH YOU AND STAY IN THE WAITING ROOM ONLY DURING PRE OP AND PROCEDURE.   **NO VISITORS ARE ALLOWED IN THE SHORT STAY AREA OR RECOVERY ROOM!!**  IF YOU WILL BE ADMITTED INTO THE HOSPITAL YOU ARE ALLOWED ONLY FOUR SUPPORT PEOPLE DURING VISITATION HOURS ONLY (7 AM -8PM)   The support person(s) must pass our screening, gel in and out, and wear a mask at all times, including in the patient's room. Patients must also wear a mask when staff or their support person are in the room. Visitors GUEST BADGE MUST BE WORN VISIBLY  One adult visitor may remain with you overnight and MUST be in the room by 8 P.M.     Your procedure is scheduled on: 07/08/23   Report to [facility]    Report to admitting at : 9:45 AM   Call this number if you have problems the morning of surgery [phone]   Do not eat food :After Midnight.   After Midnight you may have the following liquids until: 9:00 AM DAY OF SURGERY  Water  Black Coffee (sugar ok, NO MILK/CREAM OR CREAMERS)  Tea (sugar ok, NO MILK/CREAM OR CREAMERS) regular and decaf                             Plain Jell-O (NO RED)                                           Fruit ices (not with fruit pulp, NO RED)                                     Popsicles (NO RED)                                                                  Juice: apple, WHITE grape, WHITE cranberry Sports drinks like Gatorade (NO RED)              FOLLOW ANY ADDITIONAL PRE OP INSTRUCTIONS YOU RECEIVED FROM YOUR SURGEON'S OFFICE!!!   Oral Hygiene is also important to reduce your risk of infection.                                    Remember - BRUSH YOUR TEETH THE MORNING OF SURGERY WITH YOUR REGULAR TOOTHPASTE  DENTURES WILL BE REMOVED PRIOR TO SURGERY PLEASE DO NOT APPLY Poly grip OR ADHESIVES!!!   Do NOT smoke after Midnight   Take these medicines the morning of surgery with A  SIP OF WATER : metoprolol ,amlodipine ,memantine ,levothyroxine ,pantoprazole .Use inhalers as usual.Tylenol  as needed.                  You may not have any metal on your body including hair pins, jewelry, and body piercing             Do not wear make-up, lotions, powders,  perfumes/cologne, or deodorant  Do not wear nail polish including gel and S&S, artificial/acrylic nails, or any other type of covering on natural nails including finger and toenails. If you have artificial nails, gel coating, etc. that needs to be removed by a nail salon please have this removed prior to surgery or surgery may need to be canceled/ delayed if the surgeon/ anesthesia feels like they are unable to be safely monitored.   Do not shave  48 hours prior to surgery.    Do not bring valuables to the hospital. [facility] IS NOT             RESPONSIBLE   FOR VALUABLES.   Contacts, glasses, or bridgework may not be worn into surgery.   Bring small overnight bag day of surgery.   DO NOT BRING YOUR HOME MEDICATIONS TO THE HOSPITAL. PHARMACY WILL DISPENSE MEDICATIONS LISTED ON YOUR MEDICATION LIST TO YOU DURING YOUR ADMISSION IN THE HOSPITAL!    Patients discharged on the day of surgery will not be allowed to drive home.  Someone NEEDS to stay with you for the first 24 hours after anesthesia.   Special Instructions: Bring a copy of your healthcare power of attorney and living will documents         the day of surgery if you haven't scanned them before.              Please read over the following fact sheets you were given: IF YOU HAVE QUESTIONS ABOUT YOUR PRE-OP INSTRUCTIONS PLEASE CALL [phone]    [facility] - Preparing for Surgery Before surgery, you can play an important role.  Because skin is not sterile, your skin needs to be as free of germs as possible.  You can reduce the number of germs on your skin by washing with CHG (chlorahexidine gluconate) soap before surgery.  CHG is an antiseptic cleaner which kills  germs and bonds with the skin to continue killing germs even after washing. Please DO NOT use if you have an allergy  to CHG or antibacterial soaps.  If your skin becomes reddened/irritated stop using the CHG and inform your nurse when you arrive at Short Stay. Do not shave (including legs and underarms) for at least 48 hours prior to the first CHG shower.  You may shave your face/neck. Please follow these instructions carefully:  1.  Shower with CHG Soap the night before surgery and the  morning of Surgery.  2.  If you choose to wash your hair, wash your hair first as usual with your  normal  shampoo.  3.  After you shampoo, rinse your hair and body thoroughly to remove the  shampoo.                           4.  Use CHG as you would any other liquid soap.  You can apply chg directly  to the skin and wash                       Gently with a scrungie or clean washcloth.  5.  Apply the CHG Soap to your body ONLY FROM THE NECK DOWN.   Do not use on face/ open                           Wound or open sores. Avoid contact with eyes, ears mouth and genitals (private parts).  Wash face,  Genitals (private parts) with your normal soap.             6.  Wash thoroughly, paying special attention to the area where your surgery  will be performed.  7.  Thoroughly rinse your body with warm water  from the neck down.  8.  DO NOT shower/wash with your normal soap after using and rinsing off  the CHG Soap.                9.  Pat yourself dry with a clean towel.            10.  Wear clean pajamas.            11.  Place clean sheets on your bed the night of your first shower and do not  sleep with pets. Day of Surgery : Do not apply any lotions/deodorants the morning of surgery.  Please wear clean clothes to the hospital/surgery center.  FAILURE TO FOLLOW THESE INSTRUCTIONS MAY RESULT IN THE CANCELLATION OF YOUR SURGERY PATIENT SIGNATURE_________________________________  NURSE  SIGNATURE__________________________________  ________________________________________________________________________

## 2023-07-01 NOTE — Telephone Encounter (Signed)
 At last office visit per provider notes patient was to stop amlodipine  and increase metoprolol  to 50 mg BID.   Ordered metoprolol  50 mg and discontinue amlodipine  in medication chart

## 2023-07-02 ENCOUNTER — Other Ambulatory Visit: Payer: Self-pay

## 2023-07-02 ENCOUNTER — Encounter (HOSPITAL_COMMUNITY): Payer: Self-pay

## 2023-07-02 ENCOUNTER — Encounter (HOSPITAL_COMMUNITY)
Admission: RE | Admit: 2023-07-02 | Discharge: 2023-07-02 | Disposition: A | Discharge status: Home / Self Care | Payer: PPO | Source: Ambulatory Visit | Attending: Urology | Admitting: Urology

## 2023-07-02 VITALS — BP 132/86 | HR 62 | Temp 98.1°F | Ht 60.0 in | Wt 130.0 lb

## 2023-07-02 DIAGNOSIS — I130 Hypertensive heart and chronic kidney disease with heart failure and stage 1 through stage 4 chronic kidney disease, or unspecified chronic kidney disease: Secondary | ICD-10-CM | POA: Diagnosis not present

## 2023-07-02 DIAGNOSIS — N201 Calculus of ureter: Secondary | ICD-10-CM | POA: Insufficient documentation

## 2023-07-02 DIAGNOSIS — I5030 Unspecified diastolic (congestive) heart failure: Secondary | ICD-10-CM | POA: Diagnosis not present

## 2023-07-02 DIAGNOSIS — F419 Anxiety disorder, unspecified: Secondary | ICD-10-CM | POA: Diagnosis not present

## 2023-07-02 DIAGNOSIS — R569 Unspecified convulsions: Secondary | ICD-10-CM | POA: Insufficient documentation

## 2023-07-02 DIAGNOSIS — J45909 Unspecified asthma, uncomplicated: Secondary | ICD-10-CM | POA: Insufficient documentation

## 2023-07-02 DIAGNOSIS — Z8673 Personal history of transient ischemic attack (TIA), and cerebral infarction without residual deficits: Secondary | ICD-10-CM | POA: Insufficient documentation

## 2023-07-02 DIAGNOSIS — K219 Gastro-esophageal reflux disease without esophagitis: Secondary | ICD-10-CM | POA: Diagnosis not present

## 2023-07-02 DIAGNOSIS — G47419 Narcolepsy without cataplexy: Secondary | ICD-10-CM | POA: Diagnosis not present

## 2023-07-02 DIAGNOSIS — E039 Hypothyroidism, unspecified: Secondary | ICD-10-CM | POA: Diagnosis not present

## 2023-07-02 DIAGNOSIS — Z950 Presence of cardiac pacemaker: Secondary | ICD-10-CM | POA: Insufficient documentation

## 2023-07-02 DIAGNOSIS — Z01812 Encounter for preprocedural laboratory examination: Secondary | ICD-10-CM | POA: Diagnosis not present

## 2023-07-02 DIAGNOSIS — N189 Chronic kidney disease, unspecified: Secondary | ICD-10-CM | POA: Insufficient documentation

## 2023-07-02 DIAGNOSIS — Z87828 Personal history of other (healed) physical injury and trauma: Secondary | ICD-10-CM | POA: Insufficient documentation

## 2023-07-02 DIAGNOSIS — I2510 Atherosclerotic heart disease of native coronary artery without angina pectoris: Secondary | ICD-10-CM | POA: Insufficient documentation

## 2023-07-02 DIAGNOSIS — F32A Depression, unspecified: Secondary | ICD-10-CM | POA: Insufficient documentation

## 2023-07-02 DIAGNOSIS — I10 Essential (primary) hypertension: Secondary | ICD-10-CM

## 2023-07-02 HISTORY — DX: Pneumonia, unspecified organism: J18.9

## 2023-07-02 HISTORY — DX: Presence of cardiac pacemaker: Z95.0

## 2023-07-02 HISTORY — DX: Anemia, unspecified: D64.9

## 2023-07-02 LAB — BASIC METABOLIC PANEL
Anion gap: 12 (ref 5–15)
BUN: 36 mg/dL — ABNORMAL HIGH (ref 8–23)
CO2: 29 mmol/L (ref 22–32)
Calcium: 9.6 mg/dL (ref 8.9–10.3)
Chloride: 105 mmol/L (ref 98–111)
Creatinine, Ser: 1.22 mg/dL — ABNORMAL HIGH (ref 0.44–1.00)
GFR, Estimated: 43 mL/min — ABNORMAL LOW (ref >60)
Glucose, Bld: 94 mg/dL (ref 70–99)
Potassium: 4.8 mmol/L (ref 3.5–5.1)
Sodium: 146 mmol/L — ABNORMAL HIGH (ref 135–145)

## 2023-07-02 LAB — CBC
HCT: 43.6 % (ref 36.0–46.0)
Hemoglobin: 13.9 g/dL (ref 12.0–15.0)
MCH: 28.5 pg (ref 26.0–34.0)
MCHC: 31.9 g/dL (ref 30.0–36.0)
MCV: 89.5 fL (ref 80.0–100.0)
Platelets: 279 10*3/uL (ref 150–400)
RBC: 4.87 MIL/uL (ref 3.87–5.11)
RDW: 14.5 % (ref 11.5–15.5)
WBC: 7.8 10*3/uL (ref 4.0–10.5)
nRBC: 0 % (ref 0.0–0.2)

## 2023-07-02 NOTE — Progress Notes (Addendum)
 For Anesthesia: PCP - [name] [name], MD  Cardiologist - Branch, Dorn FALCON, MD  Waddell Danelle ORN, MD Electrophysiology   Bowel Prep reminder:  Chest x-ray - 02/02/23 EKG - 05/05/23 Stress Test -  ECHO - 01/27/23 Cardiac Cath -  Pacemaker/ICD device last checked:05/05/23 Pacemaker orders received: Yes: EPIC Device Rep notified:Yes: Donley James was notified.  Spinal Cord Stimulator:N/A  Sleep Study - N/A CPAP -   Fasting Blood Sugar - N/A Checks Blood Sugar _____ times a day Date and result of last Hgb A1c-  Last dose of GLP1 agonist- N/A GLP1 instructions:   Last dose of SGLT-2 inhibitors- N/A SGLT-2 instructions:   Blood Thinner Instructions: [facility] pharmacy was contacted,since they pack and deliver medicine to the pt. RN spoke to a pharmacy member about Eliquis ,that should be hold after 07/05/23.She verbalized that they will take care of it. Aspirin  Instructions: Last Dose:  Activity level: Can go up a flight of stairs and activities of daily living without stopping and without chest pain and/or shortness of breath      Unable to go up a flight of stairs without chest pain and/or shortness of breath    Anesthesia review: Hx: CVA x 4,with short term memory issues,Afib,CKD 3,CHF,HTN. Pt. Lives by herself,she depends on church members for transportation,her only family is her brother,who's hospitalized with pneumonia at the moment.Pt. does not know who will transport her the day of surgery.Dr. Devere office was contacted as well as [facility] case management team to notify them about pt.'s Situation.As per pt.,she's unable to lay down flat due to back condition.   Patient denies shortness of breath, fever, cough and chest pain at PAT appointment   Patient verbalized understanding of instructions that were given to them at the PAT appointment. Patient was also instructed that they will need to review over the PAT instructions again at home before surgery.

## 2023-07-02 NOTE — Progress Notes (Addendum)
 Received a call back from [facility] urologist scheduler)the patient's situation was explained,Pt. Status will be change to out patient on bed. Pt. Was notified by RN about it.

## 2023-07-02 NOTE — Telephone Encounter (Signed)
 I will forward to preop APP if any further notes are needed; please review notes from Dr. Carolynne Citron.

## 2023-07-02 NOTE — Telephone Encounter (Signed)
   Patient Name: [name]  DOB: 1935/08/02 MRN: [id number]  Primary Cardiologist: Alvan Carrier, MD  Chart reviewed as part of pre-operative protocol coverage. Given past medical history and time since last visit, based on ACC/AHA guidelines, [name] is at acceptable risk for the planned procedure without further cardiovascular testing.   Per Dr. Waddell,  Ms. Mcfayden is an acceptable surgical candidate.  She may hold her Eliquis  for up to 3 days before the procedure.  Restart when the bleeding risk is acceptable.  I will route this recommendation to the requesting party via Epic fax function and remove from pre-op pool.  Please call with questions.  Tranice Laduke D Erasmus Bistline, NP 07/02/2023, 12:33 PM

## 2023-07-03 ENCOUNTER — Encounter (HOSPITAL_COMMUNITY): Payer: Self-pay

## 2023-07-03 NOTE — Anesthesia Preprocedure Evaluation (Addendum)
Anesthesia Evaluation  Patient identified by MRN, date of birth, ID band Patient awake    Reviewed: Allergy & Precautions, H&P , NPO status , Patient's Chart, lab work & pertinent test results  History of Anesthesia Complications (+) PONV and history of anesthetic complications  Airway Mallampati: II   Neck ROM: full    Dental   Pulmonary asthma    breath sounds clear to auscultation       Cardiovascular hypertension, + CAD  + dysrhythmias + pacemaker  Rhythm:regular Rate:Normal     Neuro/Psych  PSYCHIATRIC DISORDERS Anxiety Depression    TIA   GI/Hepatic ,GERD  ,,  Endo/Other  Hypothyroidism    Renal/GU      Musculoskeletal  (+) Arthritis ,    Abdominal   Peds  Hematology   Anesthesia Other Findings   Reproductive/Obstetrics                             Anesthesia Physical Anesthesia Plan  ASA: 3  Anesthesia Plan: General   Post-op Pain Management:    Induction: Intravenous  PONV Risk Score and Plan: 4 or greater and Ondansetron, Dexamethasone and Treatment may vary due to age or medical condition  Airway Management Planned: LMA  Additional Equipment:   Intra-op Plan:   Post-operative Plan: Extubation in OR  Informed Consent: I have reviewed the patients History and Physical, chart, labs and discussed the procedure including the risks, benefits and alternatives for the proposed anesthesia with the patient or authorized representative who has indicated his/her understanding and acceptance.     Dental advisory given  Plan Discussed with: CRNA, Anesthesiologist and Surgeon  Anesthesia Plan Comments: (See PAT note from 2/6 by K Gekas PA-C )        Anesthesia Quick Evaluation

## 2023-07-03 NOTE — Progress Notes (Signed)
 Case: [id number] Date/Time: 07/08/23 1145   Procedure: CYSTOSCOPY/LEFT RETROGRADE PYELOGRAM/URETEROSCOPY/HOLMIUM LASER/STENT PLACEMENT (Left) - 45 MINUTES NEEDED   Anesthesia type: General   Pre-op diagnosis: LEFT URETERAL STONE   Location: [facility]   Surgeons: Devere Lonni Righter, MD       DISCUSSION: [name] is an 88 yo female who presents to PAT prior to surgery above. PMH of hx of HTN, CAD (non obstructive by cath), HFpEF, asthma, narcolepsy, seizures, hx of multiple CVAs/TIA, GERD, CKD, hypothyroidism, anxiety, depression.  Prior anesthesia complications include PONV, and TIA and seizures after surgery. Also hx of difficult intubation noted during surgery on 06/23/16, Difficult Airway- due to limited oral opening and Difficult Airway- due to reduced neck mobility  Successful intubation with 1 attempt. IV induction, Laryngoscope Size: Miller and 3, Tube type: Oral, Tube size: 7.0 mm).  Patient admitted from 9/1-9/10 for multiple issues. Presented with abdominal pain but no definite found cause. Advised f/u with GI. Hospitalization was complicated by bradycardia and underwent PPM placement.  Also patient with right sided ataxia with CT head negative x 2 (unable to perform MRI due to recent pacemaker placement).  Additionally patient complaining of a rash/pain under her right breast and was treated empirically for suspected shingles.  Discharging to SNF.  Patient follows with Cardiology for hx of PAF diagnosed in 2022 and HFpEF. She is symptomatic with A.fib (has CP and palpitations) so was started on Flecainide  but then had symptomatic bradycardia so underwent PPM during hospitalization described above on 01/28/2023. Last seen by Dr. Waddell on 05/05/23. She reports occasional palpitations but otherwise doing well after PPM placement. She was cleared for surgery:  [name] is an acceptable surgical candidate. She may hold her eliquis  for up to 3 days  before the procedure. Restart when the bleeding risk is acceptable.  Device orders: Electrophysiologist's Recommendations:   Have magnet available. Provide continuous ECG monitoring when magnet is used or reprogramming is to be performed.  Procedure may interfere with device function.  Magnet should be placed over device during procedure.  Patient follows with neurology for hx of seizures s/p head injury as a child. She does not take AEDs. Has some memory issues and takes Namenda . She has had hx of multiple CVAs, last one was approximately 2015 per chart review. Last seen by Neurology on 12/09/22. Noted that her memory is declining but she still drives/manages ADLs.  VS: BP 132/86   Pulse 62   Temp 36.7 C (Oral)   Ht 5' (1.524 m)   Wt 59 kg   SpO2 100%   BMI 25.39 kg/m   PROVIDERS: [name] [name], MD Cardiology: Ethel Ross, MD EP: Danelle Waddell, MD  LABS: Labs reviewed: Acceptable for surgery. Kidney function at baseline (all labs ordered are listed, but only abnormal results are displayed)  Labs Reviewed  BASIC METABOLIC PANEL - Abnormal; Notable for the following components:      Result Value   Sodium 146 (*)    BUN 36 (*)    Creatinine, Ser 1.22 (*)    GFR, Estimated 43 (*)    All other components within normal limits  CBC     IMAGES:  CXR 02/02/23:  FINDINGS: Left-sided pacemaker is present. The heart is enlarged, unchanged. There are minimal patchy opacities in the left lung base with small left pleural effusion. Right lung is clear. No pneumothorax. Peripherally calcified left breast implant again noted. No acute fractures are seen.   IMPRESSION: Minimal patchy  opacities in the left lung base with small left pleural effusion.   EKG 05/05/23:  Atrial paced, rate 60  CV:  Device check 05/05/23: Pacemaker check in clinic. Normal device function. Thresholds, sensing, impedance's consistent with previous measurements. Device programmed to maximize  longevity. Device programmed at appropriate safety margins. Histogram distribution appropriate for patient activity level. Device programmed to optimize intrinsic conduction. Estimated longevity 14.1 years. Patient enrolled in remote follow-up. Patient education completed.  Echo 01/27/23:  IMPRESSIONS    1. Left ventricular ejection fraction, by estimation, is 60 to 65%. The left ventricle has normal function. The left ventricle has no regional wall motion abnormalities. Left ventricular diastolic parameters are consistent with Grade II diastolic dysfunction (pseudonormalization).  2. Right ventricular systolic function is normal. The right ventricular size is normal. There is mildly elevated pulmonary artery systolic pressure. The estimated right ventricular systolic pressure is 43.1 mmHg.  3. Left atrial size was moderately dilated.  4. The mitral valve is normal in structure. Mild mitral valve regurgitation. No evidence of mitral stenosis.  5. The aortic valve is calcified. There is mild calcification of the aortic valve. Aortic valve regurgitation is trivial. Aortic valve sclerosis/calcification is present, without any evidence of aortic stenosis.  6. The inferior vena cava is dilated in size with <50% respiratory variability, suggesting right atrial pressure of 15 mmHg.  Holter monitor 04/10/2022:  NSR with sinus bradycardia, down to 32/min Occaisional PAC's No atrial fib, VT or SVT No symptoms to correlate sinus brady with atrial fib.   Gregg Taylor,MD Patch Wear Time:  7 days and 5 hours (2023-10-17T11:20:57-0400 to 2023-10-24T16:51:29-0400)   Patient had a min HR of 32 bpm, max HR of 84 bpm, and avg HR of 53 bpm. Predominant underlying rhythm was Sinus Rhythm. Isolated SVEs were occasional (1.5%, 7295), SVE Couplets were rare (<1.0%, 96), and no SVE Triplets were present. No Isolated VEs, VE  Couplets, or VE Triplets were present.   Stress test 10/30/20:  Normal stress  test, no evidence of any blockages as the cause of her symptoms. Needs to f/u with pcp to consider noncardiac causes, f/u with us  just as needed   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63   age 62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 2 weeks   Hemorrhoids 2007   History of kidney stones    HTN (hypertension)    Hyperplastic colon polyp 2005   Hypothyroidism    Narcolepsy    Pneumonia    PONV (postoperative nausea and vomiting)    Presence of permanent cardiac pacemaker    TIA (transient ischemic attack)     Past Surgical History:  Procedure Laterality Date   ABDOMINAL HYSTERECTOMY     age 89, complicated by poor wound healing, followed by revision of scar and radiation for ?malignancy   BACK SURGERY  07/2018   lost 2 inches   BREAST ENHANCEMENT SURGERY     BREAST IMPLANT EXCHANGE Right 06/23/2016   Procedure: RIGHT  BREAST IMPLANT REMOVAL AND REPLACEMENT;  Surgeon: Elna Pick, MD;  Location: [facility];  Service: Plastics;  Laterality: Right;   COLONOSCOPY  2005   2 hyperplastic polyps   COLONOSCOPY  2007   Dr. Harvey- hemorrhoids   COLONOSCOPY  2008   Dr. Bernis polyp- rare sigmoid diverticulosis, internal hemorrhoids   COLONOSCOPY  12/01/2011   Procedure: COLONOSCOPY;  Surgeon: Margo LITTIE Harvey, MD;  [facility];  Service: Endoscopy;  Laterality: N/A;  12:30 PM   COLONOSCOPY WITH PROPOFOL  N/A 05/22/2022   Procedure: COLONOSCOPY WITH PROPOFOL ;  Surgeon: Cindie Carlin POUR, DO;  [facility];  Service: Endoscopy;  Laterality: N/A;  1:00 PM    CYSTOSCOPY/URETEROSCOPY/HOLMIUM LASER/STENT PLACEMENT Left 02/29/2020   Procedure: CYSTOSCOPY/URETEROSCOPY/HOLMIUM LASER/STENT PLACEMENT;  Surgeon: Devere Lonni Righter, MD;  Location: [facility];  Service: Urology;  Laterality: Left;   epidural steroid injection     She is getting injections with Dr. Bonner q3wks.   ESOPHAGOGASTRODUODENOSCOPY (EGD) WITH ESOPHAGEAL DILATION N/A 04/15/2013   Procedure: ESOPHAGOGASTRODUODENOSCOPY (EGD) WITH ESOPHAGEAL DILATION;  Surgeon: Margo LITTIE Harvey, MD;  [facility];  Service: Endoscopy;  Laterality: N/A;  11:45-moved to 12:30 Anette Caldron to notify pt   EYE SURGERY  2010   EYE SURGERY     IR KYPHO LUMBAR INC FX REDUCE BONE BX UNI/BIL CANNULATION INC/IMAGING  08/23/2018   IR RADIOLOGIST EVAL & MGMT  12/23/2018   PACEMAKER IMPLANT N/A 01/28/2023   Procedure: PACEMAKER IMPLANT;  Surgeon: Waddell Danelle ORN, MD;  [facility];  Service: Cardiovascular;  Laterality: N/A;   POLYPECTOMY  05/22/2022   Procedure: POLYPECTOMY;  Surgeon: Cindie Carlin POUR, DO;  [facility];  Service: Endoscopy;;   right thumb surgery      MEDICATIONS:  acetaminophen  (TYLENOL ) 650 MG CR tablet   albuterol  (VENTOLIN  HFA) 108 (90 Base) MCG/ACT inhaler   apixaban  (ELIQUIS ) 2.5 MG TABS tablet   ezetimibe  (ZETIA ) 10 MG tablet   furosemide  (LASIX ) 20 MG tablet   levothyroxine  (SYNTHROID ) 50 MCG tablet   memantine  (NAMENDA ) 10 MG tablet   metoprolol  tartrate (LOPRESSOR ) 50 MG tablet   pantoprazole  (PROTONIX ) 40 MG tablet   Potassium Chloride  ER 20 MEQ TBCR   No current facility-administered medications for this encounter.   [name] [name] Surgical Short Stay/Anesthesiology [facility] Phone [phone] 07/03/2023 9:54 AM

## 2023-07-08 ENCOUNTER — Encounter (HOSPITAL_COMMUNITY)
Admission: RE | Disposition: A | Discharge status: Home Health | Payer: Self-pay | Source: Home / Self Care | Attending: Urology

## 2023-07-08 ENCOUNTER — Observation Stay (HOSPITAL_COMMUNITY): Payer: PPO

## 2023-07-08 ENCOUNTER — Ambulatory Visit (HOSPITAL_COMMUNITY): Payer: Self-pay | Admitting: Medical

## 2023-07-08 ENCOUNTER — Observation Stay (HOSPITAL_COMMUNITY)
Admission: RE | Admit: 2023-07-08 | Discharge: 2023-07-10 | Disposition: A | Discharge status: Home Health | Payer: PPO | Attending: Urology | Admitting: Urology

## 2023-07-08 ENCOUNTER — Other Ambulatory Visit: Payer: Self-pay

## 2023-07-08 ENCOUNTER — Ambulatory Visit (HOSPITAL_BASED_OUTPATIENT_CLINIC_OR_DEPARTMENT_OTHER): Payer: PPO | Admitting: Anesthesiology

## 2023-07-08 ENCOUNTER — Encounter (HOSPITAL_COMMUNITY): Payer: Self-pay | Admitting: Urology

## 2023-07-08 DIAGNOSIS — E039 Hypothyroidism, unspecified: Secondary | ICD-10-CM

## 2023-07-08 DIAGNOSIS — J45909 Unspecified asthma, uncomplicated: Secondary | ICD-10-CM | POA: Insufficient documentation

## 2023-07-08 DIAGNOSIS — Z950 Presence of cardiac pacemaker: Secondary | ICD-10-CM | POA: Diagnosis not present

## 2023-07-08 DIAGNOSIS — N200 Calculus of kidney: Principal | ICD-10-CM | POA: Insufficient documentation

## 2023-07-08 DIAGNOSIS — D631 Anemia in chronic kidney disease: Secondary | ICD-10-CM | POA: Diagnosis not present

## 2023-07-08 DIAGNOSIS — I10 Essential (primary) hypertension: Secondary | ICD-10-CM

## 2023-07-08 DIAGNOSIS — I2510 Atherosclerotic heart disease of native coronary artery without angina pectoris: Secondary | ICD-10-CM

## 2023-07-08 DIAGNOSIS — N135 Crossing vessel and stricture of ureter without hydronephrosis: Secondary | ICD-10-CM | POA: Diagnosis not present

## 2023-07-08 DIAGNOSIS — Z8673 Personal history of transient ischemic attack (TIA), and cerebral infarction without residual deficits: Secondary | ICD-10-CM | POA: Insufficient documentation

## 2023-07-08 DIAGNOSIS — N201 Calculus of ureter: Secondary | ICD-10-CM

## 2023-07-08 DIAGNOSIS — N184 Chronic kidney disease, stage 4 (severe): Secondary | ICD-10-CM | POA: Diagnosis not present

## 2023-07-08 DIAGNOSIS — N130 Hydronephrosis with ureteropelvic junction obstruction: Secondary | ICD-10-CM | POA: Diagnosis not present

## 2023-07-08 DIAGNOSIS — I129 Hypertensive chronic kidney disease with stage 1 through stage 4 chronic kidney disease, or unspecified chronic kidney disease: Secondary | ICD-10-CM | POA: Diagnosis not present

## 2023-07-08 HISTORY — PX: CYSTOSCOPY/URETEROSCOPY/HOLMIUM LASER/STENT PLACEMENT: SHX6546

## 2023-07-08 SURGERY — CYSTOSCOPY/URETEROSCOPY/HOLMIUM LASER/STENT PLACEMENT
Anesthesia: General | Laterality: Left

## 2023-07-08 MED ORDER — DEXAMETHASONE SODIUM PHOSPHATE 4 MG/ML IJ SOLN
INTRAMUSCULAR | Status: DC | PRN
  Administered 2023-07-08: 5 mg via INTRAVENOUS

## 2023-07-08 MED ORDER — FENTANYL CITRATE PF 50 MCG/ML IJ SOSY
PREFILLED_SYRINGE | INTRAMUSCULAR | Status: AC
  Filled 2023-07-08: qty 1

## 2023-07-08 MED ORDER — PROPOFOL 500 MG/50ML IV EMUL
INTRAVENOUS | Status: DC | PRN
  Administered 2023-07-08: 150 ug/kg/min via INTRAVENOUS

## 2023-07-08 MED ORDER — HYDROMORPHONE HCL 1 MG/ML IJ SOLN
0.2500 mg | INTRAMUSCULAR | Status: DC | PRN
Start: 2023-07-08 — End: 2023-07-08
  Administered 2023-07-08: 0.25 mg via INTRAVENOUS
  Administered 2023-07-08: 0.5 mg via INTRAVENOUS
  Administered 2023-07-08: 0.25 mg via INTRAVENOUS

## 2023-07-08 MED ORDER — ONDANSETRON HCL 4 MG/2ML IJ SOLN
INTRAMUSCULAR | Status: AC
  Filled 2023-07-08: qty 2

## 2023-07-08 MED ORDER — POTASSIUM CHLORIDE ER 20 MEQ PO TBCR: 20.0000 meq | EXTENDED_RELEASE_TABLET | Freq: Every day | ORAL | Status: DC

## 2023-07-08 MED ORDER — OXYCODONE HCL 5 MG PO TABS: 5.0000 mg | ORAL_TABLET | Freq: Once | ORAL | Status: DC | PRN

## 2023-07-08 MED ORDER — HYDROMORPHONE HCL 1 MG/ML IJ SOLN
0.5000 mg | INTRAMUSCULAR | Status: DC | PRN
  Administered 2023-07-08: 0.5 mg via INTRAVENOUS
  Administered 2023-07-08: 1 mg via INTRAVENOUS
  Filled 2023-07-08: qty 1

## 2023-07-08 MED ORDER — DIPHENHYDRAMINE HCL 12.5 MG/5ML PO ELIX: 12.5000 mg | ORAL_SOLUTION | Freq: Four times a day (QID) | ORAL | Status: DC | PRN

## 2023-07-08 MED ORDER — FUROSEMIDE 40 MG PO TABS
60.0000 mg | ORAL_TABLET | Freq: Every day | ORAL | Status: DC
  Administered 2023-07-09 – 2023-07-10 (×2): 60 mg via ORAL
  Filled 2023-07-08 (×2): qty 1

## 2023-07-08 MED ORDER — SENNOSIDES-DOCUSATE SODIUM 8.6-50 MG PO TABS: 1.0000 | ORAL_TABLET | Freq: Every evening | ORAL | Status: DC | PRN

## 2023-07-08 MED ORDER — PANTOPRAZOLE SODIUM 40 MG PO TBEC
40.0000 mg | DELAYED_RELEASE_TABLET | Freq: Every day | ORAL | Status: DC
  Administered 2023-07-09 – 2023-07-10 (×2): 40 mg via ORAL
  Filled 2023-07-08 (×2): qty 1

## 2023-07-08 MED ORDER — FENTANYL CITRATE (PF) 100 MCG/2ML IJ SOLN
INTRAMUSCULAR | Status: AC
  Filled 2023-07-08: qty 2

## 2023-07-08 MED ORDER — FENTANYL CITRATE (PF) 100 MCG/2ML IJ SOLN
INTRAMUSCULAR | Status: DC | PRN
  Administered 2023-07-08: 25 ug via INTRAVENOUS
  Administered 2023-07-08: 50 ug via INTRAVENOUS

## 2023-07-08 MED ORDER — ALBUTEROL SULFATE HFA 108 (90 BASE) MCG/ACT IN AERS: 2.0000 | INHALATION_SPRAY | Freq: Four times a day (QID) | RESPIRATORY_TRACT | Status: DC | PRN

## 2023-07-08 MED ORDER — PHENYLEPHRINE HCL (PRESSORS) 10 MG/ML IV SOLN
INTRAVENOUS | Status: DC | PRN
  Administered 2023-07-08: 160 ug via INTRAVENOUS
  Administered 2023-07-08 (×2): 80 ug via INTRAVENOUS

## 2023-07-08 MED ORDER — ONDANSETRON HCL 4 MG/2ML IJ SOLN: 4.0000 mg | Freq: Four times a day (QID) | INTRAMUSCULAR | Status: DC | PRN

## 2023-07-08 MED ORDER — ALBUTEROL SULFATE (2.5 MG/3ML) 0.083% IN NEBU: 2.5000 mg | INHALATION_SOLUTION | Freq: Four times a day (QID) | RESPIRATORY_TRACT | Status: DC | PRN

## 2023-07-08 MED ORDER — CEFAZOLIN SODIUM-DEXTROSE 2-4 GM/100ML-% IV SOLN
2.0000 g | INTRAVENOUS | Status: AC
  Administered 2023-07-08: 2 g via INTRAVENOUS
  Filled 2023-07-08: qty 100

## 2023-07-08 MED ORDER — ONDANSETRON HCL 4 MG/2ML IJ SOLN: 4.0000 mg | INTRAMUSCULAR | Status: DC | PRN

## 2023-07-08 MED ORDER — ONDANSETRON HCL 4 MG/2ML IJ SOLN
INTRAMUSCULAR | Status: DC | PRN
  Administered 2023-07-08: 4 mg via INTRAVENOUS

## 2023-07-08 MED ORDER — LIDOCAINE HCL (CARDIAC) PF 100 MG/5ML IV SOSY
PREFILLED_SYRINGE | INTRAVENOUS | Status: DC | PRN
  Administered 2023-07-08: 60 mg via INTRAVENOUS

## 2023-07-08 MED ORDER — OXYBUTYNIN CHLORIDE 5 MG PO TABS
5.0000 mg | ORAL_TABLET | Freq: Three times a day (TID) | ORAL | Status: DC | PRN
  Filled 2023-07-08: qty 1

## 2023-07-08 MED ORDER — POTASSIUM CHLORIDE CRYS ER 20 MEQ PO TBCR
20.0000 meq | EXTENDED_RELEASE_TABLET | Freq: Every day | ORAL | Status: DC
  Administered 2023-07-09 – 2023-07-10 (×2): 20 meq via ORAL
  Filled 2023-07-08 (×2): qty 1

## 2023-07-08 MED ORDER — ORAL CARE MOUTH RINSE: 15.0000 mL | Freq: Once | OROMUCOSAL | Status: AC

## 2023-07-08 MED ORDER — OXYCODONE HCL 5 MG PO TABS
5.0000 mg | ORAL_TABLET | ORAL | Status: DC | PRN
  Administered 2023-07-08 – 2023-07-09 (×2): 5 mg via ORAL
  Filled 2023-07-08 (×3): qty 1

## 2023-07-08 MED ORDER — APIXABAN 2.5 MG PO TABS
2.5000 mg | ORAL_TABLET | Freq: Two times a day (BID) | ORAL | Status: DC
  Administered 2023-07-09 – 2023-07-10 (×3): 2.5 mg via ORAL
  Filled 2023-07-08 (×3): qty 1

## 2023-07-08 MED ORDER — ACETAMINOPHEN 325 MG PO TABS: 650.0000 mg | ORAL_TABLET | ORAL | Status: DC | PRN

## 2023-07-08 MED ORDER — EZETIMIBE 10 MG PO TABS
10.0000 mg | ORAL_TABLET | Freq: Every day | ORAL | Status: DC
  Administered 2023-07-09 – 2023-07-10 (×2): 10 mg via ORAL
  Filled 2023-07-08 (×2): qty 1

## 2023-07-08 MED ORDER — FENTANYL CITRATE PF 50 MCG/ML IJ SOSY
25.0000 ug | PREFILLED_SYRINGE | INTRAMUSCULAR | Status: DC | PRN
Start: 2023-07-08 — End: 2023-07-08
  Administered 2023-07-08: 25 ug via INTRAVENOUS
  Administered 2023-07-08: 50 ug via INTRAVENOUS
  Administered 2023-07-08: 25 ug via INTRAVENOUS
  Administered 2023-07-08: 50 ug via INTRAVENOUS

## 2023-07-08 MED ORDER — SODIUM CHLORIDE 0.9 % IR SOLN
Status: DC | PRN
  Administered 2023-07-08: 3000 mL via INTRAVESICAL

## 2023-07-08 MED ORDER — LACTATED RINGERS IV SOLN: INTRAVENOUS | Status: DC

## 2023-07-08 MED ORDER — LEVOTHYROXINE SODIUM 50 MCG PO TABS
50.0000 ug | ORAL_TABLET | Freq: Every day | ORAL | Status: DC
  Administered 2023-07-09 – 2023-07-10 (×2): 50 ug via ORAL
  Filled 2023-07-08 (×2): qty 1

## 2023-07-08 MED ORDER — PHENYLEPHRINE 80 MCG/ML (10ML) SYRINGE FOR IV PUSH (FOR BLOOD PRESSURE SUPPORT)
PREFILLED_SYRINGE | INTRAVENOUS | Status: AC
  Filled 2023-07-08: qty 10

## 2023-07-08 MED ORDER — HYDROMORPHONE HCL 1 MG/ML IJ SOLN
INTRAMUSCULAR | Status: AC
  Filled 2023-07-08: qty 1

## 2023-07-08 MED ORDER — CHLORHEXIDINE GLUCONATE 0.12 % MT SOLN
15.0000 mL | Freq: Once | OROMUCOSAL | Status: AC
  Administered 2023-07-08: 15 mL via OROMUCOSAL

## 2023-07-08 MED ORDER — DIPHENHYDRAMINE HCL 50 MG/ML IJ SOLN: 12.5000 mg | Freq: Four times a day (QID) | INTRAMUSCULAR | Status: DC | PRN

## 2023-07-08 MED ORDER — DEXAMETHASONE SODIUM PHOSPHATE 10 MG/ML IJ SOLN
INTRAMUSCULAR | Status: AC
  Filled 2023-07-08: qty 1

## 2023-07-08 MED ORDER — METOPROLOL TARTRATE 50 MG PO TABS
50.0000 mg | ORAL_TABLET | Freq: Two times a day (BID) | ORAL | Status: DC
  Administered 2023-07-08 – 2023-07-10 (×3): 50 mg via ORAL
  Filled 2023-07-08 (×4): qty 1

## 2023-07-08 MED ORDER — ZOLPIDEM TARTRATE 5 MG PO TABS: 5.0000 mg | ORAL_TABLET | Freq: Every evening | ORAL | Status: DC | PRN

## 2023-07-08 MED ORDER — MEMANTINE HCL 10 MG PO TABS
10.0000 mg | ORAL_TABLET | Freq: Two times a day (BID) | ORAL | Status: DC
  Administered 2023-07-08 – 2023-07-10 (×4): 10 mg via ORAL
  Filled 2023-07-08 (×4): qty 1

## 2023-07-08 MED ORDER — OXYCODONE HCL 5 MG/5ML PO SOLN: 5.0000 mg | Freq: Once | ORAL | Status: DC | PRN

## 2023-07-08 MED ORDER — LIDOCAINE HCL (PF) 2 % IJ SOLN
INTRAMUSCULAR | Status: AC
Start: 2023-07-08 — End: ?
  Filled 2023-07-08: qty 5

## 2023-07-08 MED ORDER — PROPOFOL 10 MG/ML IV BOLUS
INTRAVENOUS | Status: DC | PRN
  Administered 2023-07-08: 30 mg via INTRAVENOUS
  Administered 2023-07-08: 140 mg via INTRAVENOUS
  Administered 2023-07-08: 50 mg via INTRAVENOUS

## 2023-07-08 SURGICAL SUPPLY — 19 items
BAG URO CATCHER STRL LF (MISCELLANEOUS) ×1 IMPLANT
BASKET ZERO TIP NITINOL 2.4FR (BASKET) IMPLANT
CATH URETL OPEN 5X70 (CATHETERS) ×1 IMPLANT
CLOTH BEACON ORANGE TIMEOUT ST (SAFETY) ×1 IMPLANT
EXTRACTOR STONE NITINOL NGAGE (UROLOGICAL SUPPLIES) IMPLANT
FIBER LASER MOSES 200 DFL (Laser) IMPLANT
FIBER LASER MOSES 365 DFL (Laser) IMPLANT
GLOVE SURG LX STRL 7.5 STRW (GLOVE) ×1 IMPLANT
GOWN STRL SURGICAL XL XLNG (GOWN DISPOSABLE) ×1 IMPLANT
GUIDEWIRE STR DUAL SENSOR (WIRE) IMPLANT
GUIDEWIRE ZIPWRE .038 STRAIGHT (WIRE) ×1 IMPLANT
KIT TURNOVER KIT A (KITS) IMPLANT
MANIFOLD NEPTUNE II (INSTRUMENTS) ×1 IMPLANT
PACK CYSTO (CUSTOM PROCEDURE TRAY) ×1 IMPLANT
SHEATH NAVIGATOR HD 11/13X36 (SHEATH) IMPLANT
STENT URET 6FRX24 CONTOUR (STENTS) IMPLANT
STENT URET 6FRX26 CONTOUR (STENTS) IMPLANT
TUBING CONNECTING 10 (TUBING) ×1 IMPLANT
TUBING UROLOGY SET (TUBING) ×1 IMPLANT

## 2023-07-08 NOTE — Progress Notes (Signed)
Call to Dr. Liliane Shi to discuss that patient more than likely took her Eliquis this AM as her pills are provided in pre filled packages provided from her pharmacy and upon call to pharmacy it was determined the following: Katie with pharmacy removed Eliquis from pack (prefilled at pharm) for 2/10& 2/11. Per Florentina Addison, she "did not have instructions for today to hold for 2/12" spoke to [place]. Patient likely had Eliquis today 2/12 as Pharmacy [facility]) states they did not have instructions to remove from prefilled pack.

## 2023-07-08 NOTE — Transfer of Care (Signed)
Immediate Anesthesia Transfer of Care Note  Patient: [name]  Procedure(s) Performed: Procedure(s) with comments: CYSTOSCOPY/LEFT RETROGRADE PYELOGRAM/URETEROSCOPY/HOLMIUM LASER/STENT PLACEMENT (Left) - 45 MINUTES NEEDED  Patient Location: PACU  Anesthesia Type:General  Level of Consciousness: Alert, Awake, Oriented  Airway & Oxygen Therapy: Patient Spontanous Breathing  Post-op Assessment: Report given to RN  Post vital signs: Reviewed and stable  Last Vitals:  Vitals:   07/08/23 0954  BP: (!) 145/69  Pulse: 60  Resp: 16  Temp: 36.5 C  SpO2: 96%    Complications: No apparent anesthesia complications

## 2023-07-08 NOTE — Anesthesia Procedure Notes (Signed)
Procedure Name: LMA Insertion Date/Time: 07/08/2023 12:13 PM  Performed by: Cleda Clarks, CRNAPre-anesthesia Checklist: Patient identified, Emergency Drugs available, Suction available and Patient being monitored Patient Re-evaluated:Patient Re-evaluated prior to induction Oxygen Delivery Method: Circle system utilized Preoxygenation: Pre-oxygenation with 100% oxygen Induction Type: IV induction Ventilation: Mask ventilation without difficulty LMA: LMA inserted LMA Size: 4.0 Number of attempts: 1 Placement Confirmation: positive ETCO2 Tube secured with: Tape Dental Injury: Teeth and Oropharynx as per pre-operative assessment

## 2023-07-08 NOTE — Op Note (Signed)
Operative Note  Preoperative diagnosis:  1.  Chronic left UPJ obstruction 2.  Two 5 mm left renal stones  Postoperative diagnosis: Same  Procedure(s): 1.  Cystoscopy with left ureteroscopy, holmium laser lithotripsy and left J stent placement 2.  Left retrograde pyelogram with intraoperative interpretation fluoroscopic imaging  Surgeon: Rhoderick Moody, MD  Assistants:  None  Anesthesia:  General  Complications:  None  EBL: Less than 5 mL  Specimens: 1.  None  Drains/Catheters: 1.  Left 6 French, 24 cm JJ stent without tether  Intraoperative findings:   No intravesical or urethral abnormalities were seen during cystoscopy Left retrograde pyelogram revealed no filling defects along the entirety of the left ureter.  Was narrowing at the left UPJ with a diffusely dilated left renal pelvis with no other filling defects. The left UPJ was widely patent and easily bypassed with the flexible ureteroscope Excellent fragmentation of left renal stones  Indication:  [name] is a 88 y.o. female with a history of a chronic left UPJ obstruction and left renal stones.  She has had intermittent episodes of left-sided flank pain thought to potentially be due to her left renal stones ball valving at her UPJ.  She has been consented for the above procedures, voiced understanding and wishes to proceed.  Description of procedure:  After informed consent was obtained, the patient was brought to the operating room and general LMA anesthesia was administered. The patient was then placed in the dorsolithotomy position and prepped and draped in the usual sterile fashion. A timeout was performed. A 23 French rigid cystoscope was then inserted into the urethral meatus and advanced into the bladder under direct vision. A complete bladder survey revealed no intravesical pathology.  A 5 French ureteral catheter was then inserted into the left ureteral orifice and a retrograde pyelogram was  obtained, with the findings listed above.  A Glidewire was then used to intubate the lumen of the ureteral catheter and was advanced up to the left renal pelvis, under fluoroscopic guidance.  The catheter was then removed, leaving the wire in place.  A flexible ureteroscope was then advanced up the left ureter and into the left renal pelvis, with the findings listed above.  Her left renal stone burden was identified within the renal pelvis.  A 2 and a micron holmium laser was then used to fracture the 2 stones into less than 2 mm fragments.  The flexible ureteroscope was then removed under direct vision, leaving the wire in place.  A 6 French, 24 cm JJ stent was advanced over the Glidewire and into good position within the left collecting system, confirming placement via fluoroscopy.  The patient's bladder was drained.  She tolerated the procedure well and was transferred to the postanesthesia in stable condition.  Plan: Monitor her on the floor overnight as she does not have anybody to stay with her at home.  Follow-up in 1 week for office cystoscopy and stent removal

## 2023-07-08 NOTE — H&P (Signed)
Urology Preoperative H&P   Chief Complaint: Left renal stones  History of Present Illness: [name] is a 88 y.o. female referred by Dr. Annabell Howells, with a history of kidney stones and a left sided UPJ obstruction.   Last serum creatinine-1.28 (01/2023)   Lasix renogram from 07/02/2018 showed a differential function of 42% left vs 58% right.  T1/2 post LASIK:  Left kidney will sign 18.3 minutes  Right kidney was signed 7.1 minutes   CT stone study from 12/06/2019 was obtained due to hematuria and worsening right-sided back pain. The scan showed stable left-sided hydronephrosis, but a persistent 6 mm renal pelvis stone. She was also noted to have nonobstructing right renal calculi.   -hx of chronic right-sided lower back pain (hx of T12 compression fracture)  -hx of CVA x3  -hx of prior radiation-- for non-healing incision/cancerous lesion following hysterectomy  -hx of chronic right-sided lower back pain (hx of T12 compression fracture)--planning on right hip surgery in the near future   Her most recent BMP showed a stable serum creatinine of 1.28 (01/2023). CT from Sept. showed stable, left sided UPJO/extrarenal pelvis along with multiple non-obstructing left UPJ stones. She notes a 2 day history of sharp, non-radiating left flank/low back pain--denies N/V/F/C, dysuria or hematuria. She was seen by Dr. Mena Goes in October at [facility] and did not report any pain at that time. She has OAB at baseline.    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15   age 16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 2 weeks   Hemorrhoids  2007   History of kidney stones    HTN (hypertension)    Hyperplastic colon polyp 2005   Hypothyroidism    Narcolepsy    Pneumonia    PONV (postoperative nausea and vomiting)    Presence of permanent cardiac pacemaker    TIA (transient ischemic attack)     Past Surgical History:  Procedure Laterality Date   ABDOMINAL HYSTERECTOMY     age 79, complicated by poor wound healing, followed by revision of scar and radiation for ?malignancy   BACK SURGERY  07/2018   lost 2 inches   BREAST ENHANCEMENT SURGERY     BREAST IMPLANT EXCHANGE Right 06/23/2016   Procedure: RIGHT BREAST IMPLANT REMOVAL AND REPLACEMENT;  Surgeon: Louisa Second, MD;  Location: [facility];  Service: Plastics;  Laterality: Right;   COLONOSCOPY  2005   2 hyperplastic polyps   COLONOSCOPY  2007   Dr. Darrick Penna- hemorrhoids   COLONOSCOPY  2008   Dr. Steva Ready polyp- rare sigmoid diverticulosis, internal hemorrhoids   COLONOSCOPY  12/01/2011   Procedure: COLONOSCOPY;  Surgeon: West Bali, MD;  [facility];  Service: Endoscopy;  Laterality: N/A;  12:30 PM   COLONOSCOPY WITH PROPOFOL N/A 05/22/2022   Procedure: COLONOSCOPY WITH PROPOFOL;  Surgeon: Lanelle Bal, DO;  [facility];  Service: Endoscopy;  Laterality: N/A;  1:00 PM   CYSTOSCOPY/URETEROSCOPY/HOLMIUM LASER/STENT PLACEMENT Left  02/29/2020   Procedure: CYSTOSCOPY/URETEROSCOPY/HOLMIUM LASER/STENT PLACEMENT;  Surgeon: Rene Paci, MD;  Location: [facility];  Service: Urology;  Laterality: Left;   epidural steroid injection     She is getting injections with Dr. Ethelene Hal q3wks.   ESOPHAGOGASTRODUODENOSCOPY (EGD) WITH ESOPHAGEAL DILATION N/A 04/15/2013   Procedure: ESOPHAGOGASTRODUODENOSCOPY (EGD) WITH ESOPHAGEAL DILATION;  Surgeon: West Bali, MD;  [facility];  Service: Endoscopy;  Laterality: N/A;  11:45-moved to 12:30 [name] to notify pt   EYE SURGERY  2010   EYE SURGERY     IR KYPHO  LUMBAR INC FX REDUCE BONE BX UNI/BIL CANNULATION INC/IMAGING  08/23/2018   IR RADIOLOGIST EVAL & MGMT  12/23/2018   PACEMAKER IMPLANT N/A 01/28/2023   Procedure: PACEMAKER IMPLANT;  Surgeon: Marinus Maw, MD;  [facility];  Service: Cardiovascular;  Laterality: N/A;   POLYPECTOMY  05/22/2022   Procedure: POLYPECTOMY;  Surgeon: Lanelle Bal, DO;  [facility];  Service: Endoscopy;;   right thumb surgery      Allergies:  Allergies  Allergen Reactions   Epinephrine Anaphylaxis   Demerol [Meperidine] Other (See Comments)    headaches   Morphine Sulfate Anxiety    Patient states it messes with her mind makes her narcoleptic   Vicodin [Hydrocodone-Acetaminophen] Other (See Comments)    York Spaniel it makes her crazy. Pt tolerates acetaminophen    Family History  Problem Relation Age of Onset   Colon cancer Father        age 33   Prostate cancer Father    Pancreatic cancer Mother        age 38    Social History:  reports that she has never smoked. She has never been exposed to tobacco smoke. She has never used smokeless tobacco. She reports that she does not drink alcohol and does not use drugs.  ROS: A complete review of systems was performed.  All systems are negative except for pertinent findings as noted.  Physical Exam:  Vital signs in last 24 hours:   Constitutional:  Alert and oriented, No acute distress Cardiovascular: Regular rate and rhythm, No JVD Respiratory: Normal respiratory effort, Lungs clear bilaterally GI: Abdomen is soft, nontender, nondistended, no abdominal masses GU: No CVA tenderness Lymphatic: No lymphadenopathy Neurologic: Grossly intact, no focal deficits Psychiatric: Normal mood and affect  Laboratory Data:  No results for input(s): "WBC", "HGB", "HCT", "PLT" in the last 72 hours.  No results for input(s): "NA", "K", "CL", "GLUCOSE", "BUN", "CALCIUM", "CREATININE" in the last 72 hours.  Invalid input(s): "CO3"   No  results found for this or any previous visit (from the past 24 hours). No results found for this or any previous visit (from the past 240 hours).  Renal Function: Recent Labs    07/02/23 1040  CREATININE 1.22*   Estimated Creatinine Clearance: 26.6 mL/min (A) (by C-G formula based on SCr of 1.22 mg/dL (H)).  Radiologic Imaging: No results found.  I independently reviewed the above imaging studies.  Assessment and Plan EDUARDO WURTH is a 88 y.o. female with a chronic left UPJO and left renal stones   The risks, benefits and alternatives of cystoscopy with LEFT ureteroscopy, laser lithotripsy and ureteral stent placement was discussed the patient.  Risks included, but are not limited to: bleeding, urinary tract infection, ureteral injury/avulsion, ureteral stricture formation, retained stone fragments, the possibility that multiple surgeries may be required to treat the stone(s), MI, stroke, PE and the inherent risks  of general anesthesia.  The patient voices understanding and wishes to proceed.      Rhoderick Moody, MD 07/08/2023, 7:16 AM  [facility] Pager: [phone]

## 2023-07-09 ENCOUNTER — Encounter (HOSPITAL_COMMUNITY): Payer: Self-pay | Admitting: Urology

## 2023-07-09 DIAGNOSIS — N135 Crossing vessel and stricture of ureter without hydronephrosis: Secondary | ICD-10-CM | POA: Diagnosis not present

## 2023-07-09 MED ORDER — OXYBUTYNIN CHLORIDE 5 MG PO TABS: 5.0000 mg | ORAL_TABLET | Freq: Three times a day (TID) | ORAL | 1 refills | Status: AC | PRN

## 2023-07-09 MED ORDER — PHENAZOPYRIDINE HCL 200 MG PO TABS: 200.0000 mg | ORAL_TABLET | Freq: Three times a day (TID) | ORAL | 0 refills | Status: AC | PRN

## 2023-07-09 MED ORDER — IOHEXOL 300 MG/ML  SOLN
INTRAMUSCULAR | Status: DC | PRN
Start: 2023-07-09 — End: 2023-07-10
  Administered 2023-07-09: 10 mL via URETHRAL

## 2023-07-09 MED ORDER — TRAMADOL HCL 50 MG PO TABS: 50.0000 mg | ORAL_TABLET | Freq: Four times a day (QID) | ORAL | 0 refills | Status: AC | PRN

## 2023-07-09 NOTE — Progress Notes (Addendum)
Patient alert this morning, some disorientation and forgetfulness, but easily reoriented to time and situation. Hematuria, bright red, but up to bathroom with assistance to void. Unsteady gait, right lower extremity weak.  At 11:00 AM, entered patient's room, she was yelling that she needed to be discharged and had friends waiting all day to pick her up. (I spoke with both friends on the phone who were more concerned with patient's safety since she lives alone and advocated keeping her until she is safe to discharge). Patient got dressed in her clothes and afterward was crying and saying her abdomen really hurt, had trouble moving back to bed and she was having trouble peeing. Provider notified, ordered bladder scan post void and PT to evaluate.  After PT ambulated with patient, about 15 minutes later patient was again crying and said she couldn't stand the pain in her abdomen after moving and was worried her friends were coming. I called friends again and was told they want to wait until she is ready. She said before she had gone home from a hospital admission and was hurting so bad she had to crawl across the floor to phone for help. Stated she is ok to stay here another night.

## 2023-07-09 NOTE — Progress Notes (Signed)
1 Day Post-Op Subjective: NAEO.  She reports mild left flank pain.  She is tolerating a regular diet and denies N/V/F/C.  She voided a small amount last night, but states that she doesn't have the urge to void right now.   Objective: Vital signs in last 24 hours: Temp:  [97.8 F (36.6 C)-98.4 F (36.9 C)] 98.3 F (36.8 C) (02/13 0900) Pulse Rate:  [59-71] 61 (02/13 0900) Resp:  [10-39] 16 (02/13 0900) BP: (101-150)/(51-96) 101/65 (02/13 0900) SpO2:  [90 %-100 %] 99 % (02/13 0900)  Intake/Output from previous day: 02/12 0701 - 02/13 0700 In: 940 [P.O.:240; I.V.:700] Out: 10 [Blood:10]  Intake/Output this shift: Total I/O In: 120 [P.O.:120] Out: 75 [Urine:75]  Physical Exam:  General: Alert and oriented x3   Lab Results: No results for input(s): "HGB", "HCT" in the last 72 hours. BMET No results for input(s): "NA", "K", "CL", "CO2", "GLUCOSE", "BUN", "CREATININE", "CALCIUM" in the last 72 hours.   Studies/Results: DG C-Arm 1-60 Min-No Report Result Date: 07/08/2023 Fluoroscopy was utilized by the requesting physician.  No radiographic interpretation.    Assessment/Plan: 82 yF s/p left URS/L/S on 07/08/23  -OOBTC and ambulate -PT consulted for at home recs -The patient is apprehensive about going home today--will continue to monitor.    LOS: 0 days   Rhoderick Moody, MD [facility] Pager: [phone]  07/09/2023, 4:10 PM

## 2023-07-09 NOTE — Evaluation (Addendum)
Physical Therapy Evaluation Patient Details Name: [name] MRN: [id number] DOB: 07-23-1935 Today's Date: 07/09/2023  History of Present Illness  88 y.o. female admitted 07/08/23 with kidney stones and a left sided UPJ obstruction,  s/p Cystoscopy with left ureteroscopy, holmium laser lithotripsy and left J stent placement 07/08/23. PMH: pacemaker 01/2023, CKD, HTN, PAF, hypothyroid, CVA x 3, chronic T12 compression fracture.  Clinical Impression  Pt admitted with above diagnosis. Pt ambulated 140' with a straight cane with mildly unsteady gait but no overt loss of balance. She refused use of rolling walker, but would likely benefit from increased support of a walker. Pt reports she "wobbles around normally because my back is cemented in a curve and because of the 4 strokes". She denies falls in the past 6 months and stated she's at baseline with mobility.  With mobility pt reported chronic back and RLE pain, she declined pain medication. Following PT eval, per RN, pt had significant abdominal pain. Pt currently with functional limitations due to the deficits listed below (see PT Problem List). Pt will benefit from acute skilled PT to increase their independence and safety with mobility to allow discharge.           If plan is discharge home, recommend the following: Assist for transportation;Help with stairs or ramp for entrance   Can travel by private vehicle        Equipment Recommendations None recommended by PT  Recommendations for Other Services       Functional Status Assessment Patient has had a recent decline in their functional status and demonstrates the ability to make significant improvements in function in a reasonable and predictable amount of time.     Precautions / Restrictions Precautions Precautions: Fall Recall of Precautions/Restrictions: Intact Precaution/Restrictions Comments: pt denies falls in the past 6 months Restrictions Weight Bearing Restrictions  Per Provider Order: No      Mobility  Bed Mobility Overal bed mobility: Modified Independent             General bed mobility comments: HOB up, used rail    Transfers Overall transfer level: Independent                      Ambulation/Gait Ambulation/Gait assistance: Supervision Gait Distance (Feet): 140 Feet Assistive device: Straight cane Gait Pattern/deviations: Step-through pattern, Steppage Gait velocity: WFL     General Gait Details: some steppage on R, decr R ankle DF strength noted, pt reports this is baseline 2* prior CVAs, pt observed reaching for handrail/wall in hall at times for steadying assist, she reports this is her baseline. No overt loss of balance noted but pt mildly unsteady, she repeatedly stated she wobbles around at baseline because her, "back is cemented in a curve and because of the 4 strokes".  Stairs            Wheelchair Mobility     Tilt Bed    Modified Rankin (Stroke Patients Only)       Balance Overall balance assessment: Mild deficits observed, not formally tested                                           Pertinent Vitals/Pain Pain Assessment Pain Assessment: Faces Faces Pain Scale: Hurts little more Pain Location: back and RLE (pt reports this is chronic), declined pain meds Pain Descriptors / Indicators: Aching Pain Intervention(s):  Limited activity within patient's tolerance, Monitored during session, Repositioned    Home Living Family/patient expects to be discharged to:: Private residence Living Arrangements: Alone Available Help at Discharge: Family;Available PRN/intermittently Type of Home: Apartment Home Access: Level entry       Home Layout: One level Home Equipment: Cane - single point;Shower seat;BSC/3in1;Hospital bed Additional Comments: pt reports she's unsteady at baseline 2* RLE weakness from "4 strokes", uses SPC as needed, denies falls in past 6 months    Prior  Function Prior Level of Function : Independent/Modified Independent;Driving             Mobility Comments: ambulatory with use of SPC ADLs Comments: reports she drives, gets her own groceries     Extremity/Trunk Assessment   Upper Extremity Assessment Upper Extremity Assessment: Overall WFL for tasks assessed    Lower Extremity Assessment Lower Extremity Assessment: RLE deficits/detail RLE Deficits / Details: R ankle DF 1/5    Cervical / Trunk Assessment Cervical / Trunk Assessment: Kyphotic  Communication   Communication Communication: Impaired Factors Affecting Communication: Hearing impaired    Cognition Arousal: Alert Behavior During Therapy: Anxious   PT - Cognitive impairments: No apparent impairments                       PT - Cognition Comments: pt in tears because she expected to go home earlier today and is worried that her friends who arranged to transport her home may now not be able to come. Nurse and TOC notified. Following commands: Intact       Cueing       General Comments      Exercises     Assessment/Plan    PT Assessment Patient needs continued PT services  PT Problem List Decreased balance       PT Treatment Interventions DME instruction;Gait training;Therapeutic exercise;Functional mobility training    PT Goals (Current goals can be found in the Care Plan section)  Acute Rehab PT Goals Patient Stated Goal: return home PT Goal Formulation: With patient Time For Goal Achievement: 07/23/23 Potential to Achieve Goals: Good    Frequency Min 1X/week     Co-evaluation               AM-PAC PT "6 Clicks" Mobility  Outcome Measure Help needed turning from your back to your side while in a flat bed without using bedrails?: None Help needed moving from lying on your back to sitting on the side of a flat bed without using bedrails?: None Help needed moving to and from a bed to a chair (including a wheelchair)?:  None Help needed standing up from a chair using your arms (e.g., wheelchair or bedside chair)?: None Help needed to walk in hospital room?: None Help needed climbing 3-5 steps with a railing? : None 6 Click Score: 24    End of Session Equipment Utilized During Treatment: Gait belt Activity Tolerance: Patient tolerated treatment well Patient left: in bed;with bed alarm set;with call bell/phone within reach Nurse Communication: Mobility status PT Visit Diagnosis: Unsteadiness on feet (R26.81);Difficulty in walking, not elsewhere classified (R26.2)    Time: 4098-1191 PT Time Calculation (min) (ACUTE ONLY): 12 min   Charges:   PT Evaluation $PT Eval Moderate Complexity: 1 Mod   PT General Charges $$ ACUTE PT VISIT: 1 Visit         Tamala Ser PT 07/09/2023  Acute Rehabilitation Services  Office [phone]

## 2023-07-09 NOTE — Plan of Care (Signed)

## 2023-07-09 NOTE — Care Management Obs Status (Signed)
MEDICARE OBSERVATION STATUS NOTIFICATION   Patient Details  Name: [name] MRN: [id number] Date of Birth: May 19, 1936   Medicare Observation Status Notification Given:  Yes    Larrie Kass, LCSW 07/09/2023, 1:38 PM

## 2023-07-09 NOTE — TOC Transition Note (Signed)
Transition of Care [facility]) - Discharge Note   Patient Details  Name: [name] MRN: [id number] Date of Birth: 04/02/1936  [facility]) CM/SW Contact:  Larrie Kass, LCSW Phone Number: 07/09/2023, 1:49 PM   Clinical Narrative:    CSW met with the pt to discuss the Medicare notice. Pt reports that she uses a cane at baseline and does not need any additional DME.  CSW spoke with the pt's friend, Darl Pikes, who expressed concerns about the pt returning home by herself. Pt's friends stated they were willing to pick her up but spoke with the patient's RN, who mentioned the pt is still in a lot of pain. RN will speak with the MD about delaying discharge.  Patient was recommended for home health, and she has no preferences for a home health agency.CSW to arranged home health services.   Received a message from MD pt can stay over night and d/c in the morning. TOC to follow.         Patient Goals and CMS Choice            Discharge Placement                       Discharge Plan and Services Additional resources added to the After Visit Summary for                                       Social Drivers of Health (SDOH) Interventions SDOH Screenings   Food Insecurity: No Food Insecurity (07/08/2023)  Housing: Low Risk  (07/08/2023)  Transportation Needs: No Transportation Needs (07/08/2023)  Utilities: Not At Risk (07/08/2023)  Alcohol Screen: Low Risk  (02/06/2022)  Depression (PHQ2-9): Low Risk  (02/06/2022)  Financial Resource Strain: Low Risk  (02/06/2022)  Physical Activity: Inactive (02/06/2022)  Social Connections: Moderately Integrated (07/08/2023)  Stress: No Stress Concern Present (02/06/2022)  Tobacco Use: Low Risk  (07/08/2023)     Readmission Risk Interventions     No data to display

## 2023-07-09 NOTE — Anesthesia Postprocedure Evaluation (Signed)
Anesthesia Post Note  Patient: [name]  Procedure(s) Performed: CYSTOSCOPY/LEFT RETROGRADE PYELOGRAM/URETEROSCOPY/HOLMIUM LASER/STENT PLACEMENT (Left)     Patient location during evaluation: PACU Anesthesia Type: General Level of consciousness: awake and alert Pain management: pain level controlled Vital Signs Assessment: post-procedure vital signs reviewed and stable Respiratory status: spontaneous breathing, nonlabored ventilation, respiratory function stable and patient connected to nasal cannula oxygen Cardiovascular status: blood pressure returned to baseline and stable Postop Assessment: no apparent nausea or vomiting Anesthetic complications: no   No notable events documented.  Last Vitals:  Vitals:   07/09/23 0458 07/09/23 0900  BP: (!) 102/51 101/65  Pulse: 60 61  Resp: 18 16  Temp: 36.7 C 36.8 C  SpO2: 94% 99%    Last Pain:  Vitals:   07/09/23 0900  TempSrc: Oral  PainSc: 0-No pain                 Lurie Mullane S

## 2023-07-09 NOTE — Progress Notes (Signed)
Patient IV removed for planned DC in the morning. No IV placed. Provider aware.

## 2023-07-10 DIAGNOSIS — N135 Crossing vessel and stricture of ureter without hydronephrosis: Secondary | ICD-10-CM | POA: Diagnosis not present

## 2023-07-10 NOTE — Plan of Care (Signed)

## 2023-07-10 NOTE — TOC Transition Note (Signed)
Transition of Care [facility]) - Discharge Note   Patient Details  Name: [name] MRN: [id number] Date of Birth: Dec 08, 1935  [facility]) CM/SW Contact:  Larrie Kass, LCSW Phone Number: 07/10/2023, 8:30 AM   Clinical Narrative:     Pt's home health PT/OT and SW was arranged with [facility], will need HH orders placed. Pts friends Darl Pikes will pick pt up upon d/c . No further TOC needs TOC sign off.         Patient Goals and CMS Choice            Discharge Placement                       Discharge Plan and Services Additional resources added to the After Visit Summary for                                       Social Drivers of Health (SDOH) Interventions SDOH Screenings   Food Insecurity: No Food Insecurity (07/08/2023)  Housing: Low Risk  (07/08/2023)  Transportation Needs: No Transportation Needs (07/08/2023)  Utilities: Not At Risk (07/08/2023)  Alcohol Screen: Low Risk  (02/06/2022)  Depression (PHQ2-9): Low Risk  (02/06/2022)  Financial Resource Strain: Low Risk  (02/06/2022)  Physical Activity: Inactive (02/06/2022)  Social Connections: Moderately Integrated (07/08/2023)  Stress: No Stress Concern Present (02/06/2022)  Tobacco Use: Low Risk  (07/08/2023)     Readmission Risk Interventions     No data to display

## 2023-07-14 DIAGNOSIS — N200 Calculus of kidney: Secondary | ICD-10-CM | POA: Diagnosis not present

## 2023-07-16 ENCOUNTER — Ambulatory Visit: Payer: PPO

## 2023-07-17 DIAGNOSIS — M51369 Other intervertebral disc degeneration, lumbar region without mention of lumbar back pain or lower extremity pain: Secondary | ICD-10-CM | POA: Diagnosis not present

## 2023-07-17 DIAGNOSIS — Z7901 Long term (current) use of anticoagulants: Secondary | ICD-10-CM | POA: Diagnosis not present

## 2023-07-17 DIAGNOSIS — K219 Gastro-esophageal reflux disease without esophagitis: Secondary | ICD-10-CM | POA: Diagnosis not present

## 2023-07-17 DIAGNOSIS — G8929 Other chronic pain: Secondary | ICD-10-CM | POA: Diagnosis not present

## 2023-07-17 DIAGNOSIS — Z8673 Personal history of transient ischemic attack (TIA), and cerebral infarction without residual deficits: Secondary | ICD-10-CM | POA: Diagnosis not present

## 2023-07-17 DIAGNOSIS — K648 Other hemorrhoids: Secondary | ICD-10-CM | POA: Diagnosis not present

## 2023-07-17 DIAGNOSIS — I129 Hypertensive chronic kidney disease with stage 1 through stage 4 chronic kidney disease, or unspecified chronic kidney disease: Secondary | ICD-10-CM | POA: Diagnosis not present

## 2023-07-17 DIAGNOSIS — D631 Anemia in chronic kidney disease: Secondary | ICD-10-CM | POA: Diagnosis not present

## 2023-07-17 DIAGNOSIS — G47419 Narcolepsy without cataplexy: Secondary | ICD-10-CM | POA: Diagnosis not present

## 2023-07-17 DIAGNOSIS — N132 Hydronephrosis with renal and ureteral calculous obstruction: Secondary | ICD-10-CM | POA: Diagnosis not present

## 2023-07-17 DIAGNOSIS — I2510 Atherosclerotic heart disease of native coronary artery without angina pectoris: Secondary | ICD-10-CM | POA: Diagnosis not present

## 2023-07-17 DIAGNOSIS — N3281 Overactive bladder: Secondary | ICD-10-CM | POA: Diagnosis not present

## 2023-07-17 DIAGNOSIS — Z960 Presence of urogenital implants: Secondary | ICD-10-CM | POA: Diagnosis not present

## 2023-07-17 DIAGNOSIS — F32A Depression, unspecified: Secondary | ICD-10-CM | POA: Diagnosis not present

## 2023-07-17 DIAGNOSIS — Z860102 Personal history of hyperplastic colon polyps: Secondary | ICD-10-CM | POA: Diagnosis not present

## 2023-07-17 DIAGNOSIS — Z950 Presence of cardiac pacemaker: Secondary | ICD-10-CM | POA: Diagnosis not present

## 2023-07-17 DIAGNOSIS — Z8701 Personal history of pneumonia (recurrent): Secondary | ICD-10-CM | POA: Diagnosis not present

## 2023-07-17 DIAGNOSIS — N189 Chronic kidney disease, unspecified: Secondary | ICD-10-CM | POA: Diagnosis not present

## 2023-07-17 DIAGNOSIS — F419 Anxiety disorder, unspecified: Secondary | ICD-10-CM | POA: Diagnosis not present

## 2023-07-17 DIAGNOSIS — J45909 Unspecified asthma, uncomplicated: Secondary | ICD-10-CM | POA: Diagnosis not present

## 2023-07-17 DIAGNOSIS — I480 Paroxysmal atrial fibrillation: Secondary | ICD-10-CM | POA: Diagnosis not present

## 2023-07-17 DIAGNOSIS — Z48816 Encounter for surgical aftercare following surgery on the genitourinary system: Secondary | ICD-10-CM | POA: Diagnosis not present

## 2023-07-17 DIAGNOSIS — K5730 Diverticulosis of large intestine without perforation or abscess without bleeding: Secondary | ICD-10-CM | POA: Diagnosis not present

## 2023-07-17 DIAGNOSIS — E039 Hypothyroidism, unspecified: Secondary | ICD-10-CM | POA: Diagnosis not present

## 2023-07-22 DIAGNOSIS — I480 Paroxysmal atrial fibrillation: Secondary | ICD-10-CM | POA: Diagnosis not present

## 2023-07-22 DIAGNOSIS — Z79899 Other long term (current) drug therapy: Secondary | ICD-10-CM | POA: Diagnosis not present

## 2023-07-22 DIAGNOSIS — R197 Diarrhea, unspecified: Secondary | ICD-10-CM | POA: Diagnosis not present

## 2023-07-22 DIAGNOSIS — Z79890 Hormone replacement therapy (postmenopausal): Secondary | ICD-10-CM | POA: Diagnosis not present

## 2023-07-22 DIAGNOSIS — N1831 Chronic kidney disease, stage 3a: Secondary | ICD-10-CM | POA: Diagnosis not present

## 2023-07-22 DIAGNOSIS — I10 Essential (primary) hypertension: Secondary | ICD-10-CM | POA: Diagnosis not present

## 2023-07-22 DIAGNOSIS — Z7952 Long term (current) use of systemic steroids: Secondary | ICD-10-CM | POA: Diagnosis not present

## 2023-07-22 DIAGNOSIS — E870 Hyperosmolality and hypernatremia: Secondary | ICD-10-CM | POA: Diagnosis not present

## 2023-07-22 DIAGNOSIS — Z7901 Long term (current) use of anticoagulants: Secondary | ICD-10-CM | POA: Diagnosis not present

## 2023-07-22 DIAGNOSIS — E039 Hypothyroidism, unspecified: Secondary | ICD-10-CM | POA: Diagnosis not present

## 2023-07-22 DIAGNOSIS — I129 Hypertensive chronic kidney disease with stage 1 through stage 4 chronic kidney disease, or unspecified chronic kidney disease: Secondary | ICD-10-CM | POA: Diagnosis not present

## 2023-07-22 DIAGNOSIS — N130 Hydronephrosis with ureteropelvic junction obstruction: Secondary | ICD-10-CM | POA: Diagnosis not present

## 2023-07-23 ENCOUNTER — Encounter: Payer: PPO | Attending: Internal Medicine | Admitting: *Deleted

## 2023-07-23 VITALS — BP 106/62 | HR 60 | Temp 97.6°F | Resp 16

## 2023-07-23 DIAGNOSIS — M810 Age-related osteoporosis without current pathological fracture: Secondary | ICD-10-CM

## 2023-07-23 MED ORDER — DENOSUMAB 60 MG/ML ~~LOC~~ SOSY
60.0000 mg | PREFILLED_SYRINGE | Freq: Once | SUBCUTANEOUS | Status: AC
Start: 2023-07-23 — End: 2023-07-23
  Administered 2023-07-23: 60 mg via SUBCUTANEOUS

## 2023-07-23 NOTE — Progress Notes (Signed)
 Diagnosis: Osteoporosis  Provider:  Dwana Melena MD  Procedure: Injection  Prolia (Denosumab), Dose: 60 mg, Site: subcutaneous, Number of injections: 1  Injection Site(s): Right arm  Post Care: Observation period completed  Discharge: Condition: Good, Destination: Home . AVS Provided  Performed by:  Daleen Squibb, RN

## 2023-07-24 NOTE — Discharge Summary (Signed)
 Date of admission: 07/08/2023  Date of discharge: 07/10/2023  Admission diagnosis: Left renal/UPJ stones and left UPJ obstruction  Discharge diagnosis: Same  Procedures: Uncomplicated left ureteroscopy with laser lithotripsy and ureteral stent placement.  History and Physical: For full details, please see admission history and physical. Briefly, [name] is a 88 y.o. year old patient with a history of a chronic UPJO and left renal/UPJ stones.   Hospital Course: Patient was admitted due to not having anyone at home to care for her after surgery.  She had a routine post-op course and was discharged home with home health.   Physical Exam:  General: Alert and oriented CV: RRR, palpable distal pulses Lungs: CTAB, equal chest rise Abdomen: Soft, NTND, no rebound or guarding Ext: NT, No erythema  Laboratory values: No results for input(s): "HGB", "HCT" in the last 72 hours. No results for input(s): "CREATININE" in the last 72 hours.  Disposition: Home  Discharge instruction: The patient was instructed to be ambulatory but told to refrain from heavy lifting, strenuous activity, or driving.  Discharge medications:  Allergies as of 07/10/2023       Reactions   Epinephrine Anaphylaxis   Demerol [meperidine] Other (See Comments)   headaches   Morphine Sulfate Anxiety   Patient states it messes with her mind makes her narcoleptic   Vicodin [hydrocodone-acetaminophen] Other (See Comments)   [place] Spaniel it makes her crazy. Pt tolerates acetaminophen        Medication List     TAKE these medications    acetaminophen 650 MG CR tablet Commonly known as: TYLENOL Take 650 mg by mouth every 8 (eight) hours as needed for pain.   albuterol 108 (90 Base) MCG/ACT inhaler Commonly known as: VENTOLIN HFA Inhale 2 puffs into the lungs every 6 (six) hours as needed for wheezing or shortness of breath.   apixaban 2.5 MG Tabs tablet Commonly known as: Eliquis Take 1 tablet (2.5 mg total)  by mouth 2 (two) times daily.   ezetimibe 10 MG tablet Commonly known as: ZETIA Take 1 tablet (10 mg total) by mouth daily.   furosemide 20 MG tablet Commonly known as: LASIX Take 3 tablets (60 mg total) by mouth daily.   levothyroxine 50 MCG tablet Commonly known as: SYNTHROID Take 50 mcg by mouth daily before breakfast.   memantine 10 MG tablet Commonly known as: Namenda Take 1 tablet (10 mg total) by mouth 2 (two) times daily.   metoprolol tartrate 50 MG tablet Commonly known as: LOPRESSOR Take 1 tablet (50 mg total) by mouth 2 (two) times daily.   oxybutynin 5 MG tablet Commonly known as: DITROPAN Take 1 tablet (5 mg total) by mouth every 8 (eight) hours as needed for bladder spasms.   pantoprazole 40 MG tablet Commonly known as: PROTONIX Take 1 tablet (40 mg total) by mouth daily.   phenazopyridine 200 MG tablet Commonly known as: Pyridium Take 1 tablet (200 mg total) by mouth 3 (three) times daily as needed (for pain with urination).   Potassium Chloride ER 20 MEQ Tbcr Take 1 tablet (20 mEq total) by mouth daily. 1 tab daily by mouth--- take while taking Lasix/furosemide       ASK your doctor about these medications    traMADol 50 MG tablet Commonly known as: Ultram Take 1 tablet (50 mg total) by mouth every 6 (six) hours as needed for up to 5 days. Ask about: Should I take this medication?        Followup:  Follow-up Information     [facility] Follow up on 07/15/2023.   Why: Post-op appointment for stent removal at 11:00 AM Contact information: [address] [place] [address] [phone]        Care, [facility] Follow up.   Specialty: Home Health Services Why: home health Contact information: [address] [place] [address] [phone]

## 2023-07-31 ENCOUNTER — Ambulatory Visit (INDEPENDENT_AMBULATORY_CARE_PROVIDER_SITE_OTHER): Payer: PPO

## 2023-07-31 DIAGNOSIS — I495 Sick sinus syndrome: Secondary | ICD-10-CM

## 2023-08-02 LAB — CUP PACEART REMOTE DEVICE CHECK
Battery Remaining Longevity: 168 mo
Battery Voltage: 3.18 V
Brady Statistic AP VP Percent: 0.02 %
Brady Statistic AP VS Percent: 42.94 %
Brady Statistic AS VP Percent: 0.02 %
Brady Statistic AS VS Percent: 57.03 %
Brady Statistic RA Percent Paced: 43 %
Brady Statistic RV Percent Paced: 0.04 %
Date Time Interrogation Session: 20250306234423
Implantable Lead Connection Status: 753985
Implantable Lead Connection Status: 753985
Implantable Lead Implant Date: 20240904
Implantable Lead Implant Date: 20240904
Implantable Lead Location: 753859
Implantable Lead Location: 753860
Implantable Lead Model: 3830
Implantable Lead Model: 5076
Implantable Pulse Generator Implant Date: 20240904
Lead Channel Impedance Value: 304 Ohm
Lead Channel Impedance Value: 323 Ohm
Lead Channel Impedance Value: 437 Ohm
Lead Channel Impedance Value: 513 Ohm
Lead Channel Pacing Threshold Amplitude: 0.625 V
Lead Channel Pacing Threshold Amplitude: 0.875 V
Lead Channel Pacing Threshold Pulse Width: 0.4 ms
Lead Channel Pacing Threshold Pulse Width: 0.4 ms
Lead Channel Sensing Intrinsic Amplitude: 1.5 mV
Lead Channel Sensing Intrinsic Amplitude: 1.5 mV
Lead Channel Sensing Intrinsic Amplitude: 26.625 mV
Lead Channel Sensing Intrinsic Amplitude: 26.625 mV
Lead Channel Setting Pacing Amplitude: 1.5 V
Lead Channel Setting Pacing Amplitude: 2 V
Lead Channel Setting Pacing Pulse Width: 0.4 ms
Lead Channel Setting Sensing Sensitivity: 1.2 mV
Zone Setting Status: 755011

## 2023-08-04 ENCOUNTER — Encounter: Payer: Self-pay | Admitting: Internal Medicine

## 2023-08-10 DIAGNOSIS — H01004 Unspecified blepharitis left upper eyelid: Secondary | ICD-10-CM | POA: Diagnosis not present

## 2023-08-10 DIAGNOSIS — H0012 Chalazion right lower eyelid: Secondary | ICD-10-CM | POA: Diagnosis not present

## 2023-08-10 DIAGNOSIS — H01005 Unspecified blepharitis left lower eyelid: Secondary | ICD-10-CM | POA: Diagnosis not present

## 2023-08-10 DIAGNOSIS — H01002 Unspecified blepharitis right lower eyelid: Secondary | ICD-10-CM | POA: Diagnosis not present

## 2023-08-10 DIAGNOSIS — H01001 Unspecified blepharitis right upper eyelid: Secondary | ICD-10-CM | POA: Diagnosis not present

## 2023-08-10 DIAGNOSIS — Z961 Presence of intraocular lens: Secondary | ICD-10-CM | POA: Diagnosis not present

## 2023-08-10 DIAGNOSIS — H353131 Nonexudative age-related macular degeneration, bilateral, early dry stage: Secondary | ICD-10-CM | POA: Diagnosis not present

## 2023-08-25 DIAGNOSIS — N202 Calculus of kidney with calculus of ureter: Secondary | ICD-10-CM | POA: Diagnosis not present

## 2023-08-25 DIAGNOSIS — R1084 Generalized abdominal pain: Secondary | ICD-10-CM | POA: Diagnosis not present

## 2023-08-28 DIAGNOSIS — E039 Hypothyroidism, unspecified: Secondary | ICD-10-CM | POA: Diagnosis not present

## 2023-08-28 DIAGNOSIS — E782 Mixed hyperlipidemia: Secondary | ICD-10-CM | POA: Diagnosis not present

## 2023-08-28 DIAGNOSIS — I10 Essential (primary) hypertension: Secondary | ICD-10-CM | POA: Diagnosis not present

## 2023-09-01 NOTE — Progress Notes (Signed)
 Remote pacemaker transmission.

## 2023-09-02 NOTE — Addendum Note (Signed)
 Addended by: Elease Etienne A on: 09/02/2023 12:19 PM   Modules accepted: Orders

## 2023-09-03 DIAGNOSIS — E782 Mixed hyperlipidemia: Secondary | ICD-10-CM | POA: Diagnosis not present

## 2023-09-03 DIAGNOSIS — I10 Essential (primary) hypertension: Secondary | ICD-10-CM | POA: Diagnosis not present

## 2023-09-03 DIAGNOSIS — E870 Hyperosmolality and hypernatremia: Secondary | ICD-10-CM | POA: Diagnosis not present

## 2023-09-03 DIAGNOSIS — E039 Hypothyroidism, unspecified: Secondary | ICD-10-CM | POA: Diagnosis not present

## 2023-09-03 DIAGNOSIS — F03B3 Unspecified dementia, moderate, with mood disturbance: Secondary | ICD-10-CM | POA: Diagnosis not present

## 2023-09-03 DIAGNOSIS — I130 Hypertensive heart and chronic kidney disease with heart failure and stage 1 through stage 4 chronic kidney disease, or unspecified chronic kidney disease: Secondary | ICD-10-CM | POA: Diagnosis not present

## 2023-09-03 DIAGNOSIS — N130 Hydronephrosis with ureteropelvic junction obstruction: Secondary | ICD-10-CM | POA: Diagnosis not present

## 2023-09-03 DIAGNOSIS — F331 Major depressive disorder, recurrent, moderate: Secondary | ICD-10-CM | POA: Diagnosis not present

## 2023-09-03 DIAGNOSIS — I480 Paroxysmal atrial fibrillation: Secondary | ICD-10-CM | POA: Diagnosis not present

## 2023-09-03 DIAGNOSIS — G250 Essential tremor: Secondary | ICD-10-CM | POA: Diagnosis not present

## 2023-09-03 DIAGNOSIS — N1831 Chronic kidney disease, stage 3a: Secondary | ICD-10-CM | POA: Diagnosis not present

## 2023-09-03 DIAGNOSIS — Z8673 Personal history of transient ischemic attack (TIA), and cerebral infarction without residual deficits: Secondary | ICD-10-CM | POA: Diagnosis not present

## 2023-09-14 DIAGNOSIS — H0012 Chalazion right lower eyelid: Secondary | ICD-10-CM | POA: Diagnosis not present

## 2023-10-08 DIAGNOSIS — J4530 Mild persistent asthma, uncomplicated: Secondary | ICD-10-CM | POA: Diagnosis not present

## 2023-10-08 DIAGNOSIS — Z8673 Personal history of transient ischemic attack (TIA), and cerebral infarction without residual deficits: Secondary | ICD-10-CM | POA: Diagnosis not present

## 2023-10-08 DIAGNOSIS — E782 Mixed hyperlipidemia: Secondary | ICD-10-CM | POA: Diagnosis not present

## 2023-10-08 DIAGNOSIS — I480 Paroxysmal atrial fibrillation: Secondary | ICD-10-CM | POA: Diagnosis not present

## 2023-10-08 DIAGNOSIS — F331 Major depressive disorder, recurrent, moderate: Secondary | ICD-10-CM | POA: Diagnosis not present

## 2023-10-08 DIAGNOSIS — E870 Hyperosmolality and hypernatremia: Secondary | ICD-10-CM | POA: Diagnosis not present

## 2023-10-08 DIAGNOSIS — E039 Hypothyroidism, unspecified: Secondary | ICD-10-CM | POA: Diagnosis not present

## 2023-10-08 DIAGNOSIS — F03B3 Unspecified dementia, moderate, with mood disturbance: Secondary | ICD-10-CM | POA: Diagnosis not present

## 2023-10-08 DIAGNOSIS — N200 Calculus of kidney: Secondary | ICD-10-CM | POA: Diagnosis not present

## 2023-10-08 DIAGNOSIS — K219 Gastro-esophageal reflux disease without esophagitis: Secondary | ICD-10-CM | POA: Diagnosis not present

## 2023-10-08 DIAGNOSIS — I10 Essential (primary) hypertension: Secondary | ICD-10-CM | POA: Diagnosis not present

## 2023-10-08 DIAGNOSIS — I495 Sick sinus syndrome: Secondary | ICD-10-CM | POA: Diagnosis not present

## 2023-10-23 DIAGNOSIS — F03B3 Unspecified dementia, moderate, with mood disturbance: Secondary | ICD-10-CM | POA: Diagnosis not present

## 2023-10-23 DIAGNOSIS — I480 Paroxysmal atrial fibrillation: Secondary | ICD-10-CM | POA: Diagnosis not present

## 2023-10-23 DIAGNOSIS — F331 Major depressive disorder, recurrent, moderate: Secondary | ICD-10-CM | POA: Diagnosis not present

## 2023-10-23 DIAGNOSIS — I10 Essential (primary) hypertension: Secondary | ICD-10-CM | POA: Diagnosis not present

## 2023-10-30 ENCOUNTER — Ambulatory Visit (INDEPENDENT_AMBULATORY_CARE_PROVIDER_SITE_OTHER): Payer: PPO

## 2023-10-30 DIAGNOSIS — I495 Sick sinus syndrome: Secondary | ICD-10-CM | POA: Diagnosis not present

## 2023-10-30 DIAGNOSIS — E782 Mixed hyperlipidemia: Secondary | ICD-10-CM | POA: Diagnosis not present

## 2023-10-30 DIAGNOSIS — I480 Paroxysmal atrial fibrillation: Secondary | ICD-10-CM | POA: Diagnosis not present

## 2023-10-30 DIAGNOSIS — K219 Gastro-esophageal reflux disease without esophagitis: Secondary | ICD-10-CM | POA: Diagnosis not present

## 2023-10-30 DIAGNOSIS — I10 Essential (primary) hypertension: Secondary | ICD-10-CM | POA: Diagnosis not present

## 2023-10-30 DIAGNOSIS — F331 Major depressive disorder, recurrent, moderate: Secondary | ICD-10-CM | POA: Diagnosis not present

## 2023-10-30 DIAGNOSIS — F03B3 Unspecified dementia, moderate, with mood disturbance: Secondary | ICD-10-CM | POA: Diagnosis not present

## 2023-10-30 DIAGNOSIS — Z8673 Personal history of transient ischemic attack (TIA), and cerebral infarction without residual deficits: Secondary | ICD-10-CM | POA: Diagnosis not present

## 2023-10-30 DIAGNOSIS — E039 Hypothyroidism, unspecified: Secondary | ICD-10-CM | POA: Diagnosis not present

## 2023-10-30 DIAGNOSIS — E870 Hyperosmolality and hypernatremia: Secondary | ICD-10-CM | POA: Diagnosis not present

## 2023-10-30 DIAGNOSIS — J4530 Mild persistent asthma, uncomplicated: Secondary | ICD-10-CM | POA: Diagnosis not present

## 2023-10-30 DIAGNOSIS — N200 Calculus of kidney: Secondary | ICD-10-CM | POA: Diagnosis not present

## 2023-10-30 LAB — CUP PACEART REMOTE DEVICE CHECK
Battery Remaining Longevity: 161 mo
Battery Voltage: 3.15 V
Brady Statistic AP VP Percent: 0.04 %
Brady Statistic AP VS Percent: 77.39 %
Brady Statistic AS VP Percent: 0.01 %
Brady Statistic AS VS Percent: 22.56 %
Brady Statistic RA Percent Paced: 77.34 %
Brady Statistic RV Percent Paced: 0.05 %
Date Time Interrogation Session: 20250606033931
Implantable Lead Connection Status: 753985
Implantable Lead Connection Status: 753985
Implantable Lead Implant Date: 20240904
Implantable Lead Implant Date: 20240904
Implantable Lead Location: 753859
Implantable Lead Location: 753860
Implantable Lead Model: 3830
Implantable Lead Model: 5076
Implantable Pulse Generator Implant Date: 20240904
Lead Channel Impedance Value: 304 Ohm
Lead Channel Impedance Value: 323 Ohm
Lead Channel Impedance Value: 361 Ohm
Lead Channel Impedance Value: 513 Ohm
Lead Channel Pacing Threshold Amplitude: 0.625 V
Lead Channel Pacing Threshold Amplitude: 1.25 V
Lead Channel Pacing Threshold Pulse Width: 0.4 ms
Lead Channel Pacing Threshold Pulse Width: 0.4 ms
Lead Channel Sensing Intrinsic Amplitude: 1.625 mV
Lead Channel Sensing Intrinsic Amplitude: 1.625 mV
Lead Channel Sensing Intrinsic Amplitude: 25.375 mV
Lead Channel Sensing Intrinsic Amplitude: 25.375 mV
Lead Channel Setting Pacing Amplitude: 1.5 V
Lead Channel Setting Pacing Amplitude: 2.5 V
Lead Channel Setting Pacing Pulse Width: 0.4 ms
Lead Channel Setting Sensing Sensitivity: 1.2 mV
Zone Setting Status: 755011

## 2023-11-01 ENCOUNTER — Ambulatory Visit: Payer: Self-pay | Admitting: Internal Medicine

## 2023-11-10 ENCOUNTER — Encounter: Payer: Self-pay | Admitting: Gastroenterology

## 2023-11-10 ENCOUNTER — Telehealth: Payer: Self-pay | Admitting: *Deleted

## 2023-11-10 ENCOUNTER — Ambulatory Visit: Admitting: Gastroenterology

## 2023-11-10 VITALS — BP 123/74 | HR 68 | Temp 98.4°F | Ht 60.0 in | Wt 134.4 lb

## 2023-11-10 DIAGNOSIS — Z800 Family history of malignant neoplasm of digestive organs: Secondary | ICD-10-CM

## 2023-11-10 DIAGNOSIS — R6881 Early satiety: Secondary | ICD-10-CM

## 2023-11-10 DIAGNOSIS — K824 Cholesterolosis of gallbladder: Secondary | ICD-10-CM | POA: Diagnosis not present

## 2023-11-10 DIAGNOSIS — R194 Change in bowel habit: Secondary | ICD-10-CM | POA: Diagnosis not present

## 2023-11-10 NOTE — Patient Instructions (Addendum)
 We are arranging a colonoscopy and upper endoscopy with Dr. Mordechai April in the near future.  We are requesting to hold Eliquis  for 2 days before the procedure.   We can update imaging if these are negative!  We will see you in follow-up thereafter!  I enjoyed seeing you again today! I value our relationship and want to provide genuine, compassionate, and quality care. You may receive a survey regarding your visit with me, and I welcome your feedback! Thanks so much for taking the time to complete this. I look forward to seeing you again.      Delman Ferns, PhD, ANP-BC [facility]

## 2023-11-10 NOTE — Progress Notes (Unsigned)
 Gastroenterology Office Note     Primary Care Physician:  [name] [name], MD  Primary Gastroenterologist: Dr. Cindie   Chief Complaint   Chief Complaint  Patient presents with   Follow-up    Follow up on abd distension. No problems with GERD. Pt states she barely eats (2 small meals a day)     History of Present Illness   [name] is an 88 y.o. female presenting today with a history of chronic GERD, polyps, colonoscopy Dec 2023 with serrated adenomas. No surveillance due to age. She was last seen in July 2024 with reports of decreased appetite, early satiety, distension. Declined any invasive testing, declined EGD. Negative celiac serologies.    Feels bloated upper abdominal. Concerned due to FH pancreatic cancer (mother). Father with colon cancer.   Barely eats twice a day. Eats small bowl. Feels full off of small amounts of food. Points at belly and says piked out at RUQ from bottom of bra and feels full across lower abdomen. No N/V. Rare GERD. No pain after eating. Pantoprazole  40 mg daily.   States now has BM 3-5 times a day and is very small amounts, long, skinny stool. Hasn't looked for rectal bleeding. No improvement with bowel movement.   CT with pancreatic protocol June 2024 with cholelithiasis, otherwise no significant or acute findings. Gallstones.   RUQ US  Aug 2024: multiple low risk sessile gallbladder polyps with largest 6.3 mm.  RUQ US  Sept 2024: small gallstones, 9 mm gallbladder wall polyp, mild wall thickening.   CTA chest/abd/pelvis: celiac mild narrowing of origin, SMA patent, IMA patent.    Lives in apartments on [address] in [place]. Lives alone.    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64   age 67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 2 weeks   Hemorrhoids 2007   History of kidney stones    HTN (hypertension)    Hyperplastic colon polyp 2005   Hypothyroidism    Narcolepsy    Pneumonia    PONV (postoperative nausea and vomiting)    Presence of permanent cardiac pacemaker    TIA (transient ischemic attack)     Past Surgical History:  Procedure Laterality Date   ABDOMINAL HYSTERECTOMY     age 44, complicated by poor wound healing, followed by revision of scar and radiation for ?malignancy   BACK SURGERY  07/2018   lost 2 inches   BREAST ENHANCEMENT SURGERY     BREAST IMPLANT EXCHANGE Right 06/23/2016   Procedure: RIGHT BREAST IMPLANT REMOVAL AND REPLACEMENT;  Surgeon: Elna Pick, MD;  Location: [facility];  Service: Plastics;  Laterality: Right;   COLONOSCOPY  2005   2 hyperplastic polyps   COLONOSCOPY  2007   Dr. Harvey- hemorrhoids   COLONOSCOPY  2008   Dr. Bernis polyp- rare sigmoid diverticulosis, internal hemorrhoids   COLONOSCOPY  12/01/2011   Procedure: COLONOSCOPY;  Surgeon: Margo LITTIE Harvey, MD;  [facility];  Service: Endoscopy;  Laterality: N/A;  12:30 PM   COLONOSCOPY WITH PROPOFOL  N/A 05/22/2022   Procedure: COLONOSCOPY WITH PROPOFOL ;  Surgeon: Cindie Carlin POUR, DO;  [facility];  Service: Endoscopy;  Laterality: N/A;  1:00 PM   CYSTOSCOPY/URETEROSCOPY/HOLMIUM LASER/STENT PLACEMENT Left 02/29/2020   Procedure: CYSTOSCOPY/URETEROSCOPY/HOLMIUM LASER/STENT PLACEMENT;  Surgeon: Devere Lonni Righter, MD;  Location: [facility];  Service: Urology;  Laterality: Left;   CYSTOSCOPY/URETEROSCOPY/HOLMIUM LASER/STENT PLACEMENT Left 07/08/2023   Procedure: CYSTOSCOPY/LEFT RETROGRADE PYELOGRAM/URETEROSCOPY/HOLMIUM LASER/STENT PLACEMENT;  Surgeon: Devere Lonni Righter, MD;  Location:  [facility];  Service: Urology;  Laterality: Left;  45 MINUTES NEEDED   epidural steroid injection     She is getting injections with Dr. Bonner q3wks.   ESOPHAGOGASTRODUODENOSCOPY (EGD) WITH ESOPHAGEAL DILATION N/A 04/15/2013   Procedure: ESOPHAGOGASTRODUODENOSCOPY (EGD) WITH ESOPHAGEAL DILATION;  Surgeon: Margo LITTIE Haddock, MD;  [facility];  Service: Endoscopy;  Laterality: N/A;  11:45-moved to 12:30 Anette Caldron to notify pt   EYE SURGERY  2010   EYE SURGERY     IR KYPHO LUMBAR INC FX REDUCE BONE BX UNI/BIL CANNULATION INC/IMAGING  08/23/2018   IR RADIOLOGIST EVAL & MGMT  12/23/2018   PACEMAKER IMPLANT N/A 01/28/2023   Procedure: PACEMAKER IMPLANT;  Surgeon: Waddell Danelle ORN, MD;  [facility];  Service: Cardiovascular;  Laterality: N/A;   POLYPECTOMY  05/22/2022   Procedure: POLYPECTOMY;  Surgeon: Cindie Carlin POUR, DO;  [facility];  Service: Endoscopy;;   right thumb surgery      Current Outpatient Medications  Medication Sig Dispense Refill   acetaminophen  (TYLENOL ) 650 MG CR tablet Take 650 mg by mouth every 8 (eight) hours as needed for pain.     albuterol  (VENTOLIN  HFA) 108 (90 Base) MCG/ACT inhaler Inhale 2 puffs into the lungs every 6 (six) hours as needed for wheezing or shortness of breath. 18 g 0   apixaban  (ELIQUIS ) 2.5 MG TABS tablet Take 1 tablet (2.5 mg total) by mouth 2 (two) times daily. 60 tablet 0   ezetimibe  (ZETIA ) 10 MG tablet Take 1 tablet (10 mg total) by mouth daily. 30 tablet 0   furosemide  (LASIX ) 20 MG tablet Take 3 tablets (60 mg total) by mouth daily. 90 tablet 0   levothyroxine  (SYNTHROID ) 50 MCG tablet Take 50 mcg by mouth daily before breakfast.     memantine  (NAMENDA ) 10 MG tablet Take 1 tablet (10 mg total) by mouth 2 (two) times daily. 60 tablet 0   metoprolol  tartrate (LOPRESSOR ) 50 MG tablet Take 1 tablet (50 mg total) by mouth 2 (two) times daily. 180 tablet 3   oxybutynin  (DITROPAN ) 5 MG tablet Take 1 tablet (5 mg total) by  mouth every 8 (eight) hours as needed for bladder spasms. 30 tablet 1   pantoprazole  (PROTONIX ) 40 MG tablet Take 1 tablet (40 mg total) by mouth daily. 30 tablet 0   phenazopyridine  (PYRIDIUM ) 200 MG tablet Take 1 tablet (200 mg total) by mouth 3 (three) times daily as needed (for pain with urination). 30 tablet 0   Potassium Chloride  ER 20 MEQ TBCR Take 1 tablet (20 mEq total) by mouth daily. 1 tab daily by mouth--- take while taking Lasix /furosemide  30 tablet 0   No current facility-administered medications for this visit.    Allergies as of 11/10/2023 - Review Complete 11/10/2023  Allergen Reaction Noted   Epinephrine  Anaphylaxis 11/18/2011   Demerol  [meperidine ] Other (See Comments) 11/18/2011   Morphine  sulfate Anxiety 01/26/2023   Vicodin [hydrocodone -acetaminophen ]  Other (See Comments) 11/18/2011    Family History  Problem Relation Age of Onset   Colon cancer Father        age 10   Prostate cancer Father    Pancreatic cancer Mother        age 35    Social History   Socioeconomic History   Marital status: Widowed    Spouse name: Not on file   Number of children: 1   Years of education: 39   Highest education level: 12th grade  Occupational History   Not on file  Tobacco Use   Smoking status: Never    Passive exposure: Never   Smokeless tobacco: Never  Vaping Use   Vaping status: Never Used  Substance and Sexual Activity   Alcohol use: No   Drug use: No   Sexual activity: Not Currently    Partners: Male  Other Topics Concern   Not on file  Social History Narrative   Lives alone   Right Handed    Drinks no caffeine daily   Social Drivers of Health   Financial Resource Strain: Low Risk  (02/06/2022)   Overall Financial Resource Strain (CARDIA)    Difficulty of Paying Living Expenses: Not hard at all  Food Insecurity: No Food Insecurity (07/08/2023)   Hunger Vital Sign    Worried About Running Out of Food in the Last Year: Never true    Ran Out of Food in  the Last Year: Never true  Transportation Needs: No Transportation Needs (07/08/2023)   PRAPARE - Administrator, Civil Service (Medical): No    Lack of Transportation (Non-Medical): No  Physical Activity: Inactive (02/06/2022)   Exercise Vital Sign    Days of Exercise per Week: 0 days    Minutes of Exercise per Session: 0 min  Stress: No Stress Concern Present (02/06/2022)   [employer] of Occupational Health - Occupational Stress Questionnaire    Feeling of Stress : Only a little  Social Connections: Moderately Integrated (07/08/2023)   Social Connection and Isolation Panel    Frequency of Communication with Friends and Family: More than three times a week    Frequency of Social Gatherings with Friends and Family: More than three times a week    Attends Religious Services: More than 4 times per year    Active Member of [employer] or Organizations: Yes    Attends Banker Meetings: More than 4 times per year    Marital Status: Widowed  Intimate Partner Violence: Not At Risk (07/08/2023)   Humiliation, Afraid, Rape, and Kick questionnaire    Fear of Current or Ex-Partner: No    Emotionally Abused: No    Physically Abused: No    Sexually Abused: No     Review of Systems   Gen: Denies any fever, chills, fatigue, weight loss, lack of appetite.  CV: Denies chest pain, heart palpitations, peripheral edema, syncope.  Resp: Denies shortness of breath at rest or with exertion. Denies wheezing or cough.  GI: Denies dysphagia or odynophagia. Denies jaundice, hematemesis, fecal incontinence. GU : Denies urinary burning, urinary frequency, urinary hesitancy MS: Denies joint pain, muscle weakness, cramps, or limitation of movement.  Derm: Denies rash, itching, dry skin Psych: Denies depression, anxiety, memory loss, and confusion Heme: Denies bruising, bleeding, and enlarged lymph nodes.   Physical Exam   BP 123/74   Pulse 68   Temp 98.4 F (36.9 C)   Ht 5'  (1.524 m)   Wt  134 lb 6.4 oz (61 kg)   BMI 26.25 kg/m  General:   Alert and oriented. Pleasant and cooperative. Well-nourished and well-developed.  Head:  Normocephalic and atraumatic. Eyes:  Without icterus Abdomen:  +BS, soft, TTP epigastric, non-tender and non-distended. No HSM noted. No guarding or rebound. No masses appreciated.  Rectal:  Deferred  Msk:  kyphosis  Extremities:  Without edema. Neurologic:  Alert and  oriented x4;  grossly normal neurologically. Skin:  Intact without significant lesions or rashes. Psych:  Alert and cooperative. Normal mood and affect.   Assessment   [name] is a 88 y.o. female presenting today with a history of   Early satiety Change in bowel habits  BLoating: appears worsened when sitting, likely due to kyphosis a   Gallbladder polyp: doesn't seem biliary related  Prior CTA negative  High anxiety   PLAN   *****    Therisa MICAEL Stager, PhD, ANP-BC [facility]

## 2023-11-10 NOTE — H&P (View-Only) (Signed)
 Gastroenterology Office Note     Primary Care Physician:  [name] [name], MD  Primary Gastroenterologist: Dr. Cindie   Chief Complaint   Chief Complaint  Patient presents with   Follow-up    Follow up on abd distension. No problems with GERD. Pt states she barely eats (2 small meals a day)     History of Present Illness   [name] is an 88 y.o. female presenting today with a history of chronic GERD, polyps, colonoscopy Dec 2023 with serrated adenomas. No surveillance due to age. She was last seen in July 2024 with reports of decreased appetite, early satiety, distension. Declined any invasive testing, declined EGD. Negative celiac serologies.    Feels bloated upper abdominal. Concerned due to FH pancreatic cancer (mother). Father with colon cancer.   Barely eats twice a day. Eats small bowl. Feels full off of small amounts of food. Points at belly and says piked out at RUQ from bottom of bra and feels full across lower abdomen. No N/V. Rare GERD. No pain after eating. Pantoprazole  40 mg daily.   States now has BM 3-5 times a day and is very small amounts, long, skinny stool. Hasn't looked for rectal bleeding. No improvement with bowel movement.   CT with pancreatic protocol June 2024 with cholelithiasis, otherwise no significant or acute findings. Gallstones.   RUQ US  Aug 2024: multiple low risk sessile gallbladder polyps with largest 6.3 mm.  RUQ US  Sept 2024: small gallstones, 9 mm gallbladder wall polyp, mild wall thickening.   CTA chest/abd/pelvis: celiac mild narrowing of origin, SMA patent, IMA patent.    Lives in apartments on [address] in [place]. Lives alone.    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64   age 67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 2 weeks   Hemorrhoids 2007   History of kidney stones    HTN (hypertension)    Hyperplastic colon polyp 2005   Hypothyroidism    Narcolepsy    Pneumonia    PONV (postoperative nausea and vomiting)    Presence of permanent cardiac pacemaker    TIA (transient ischemic attack)     Past Surgical History:  Procedure Laterality Date   ABDOMINAL HYSTERECTOMY     age 44, complicated by poor wound healing, followed by revision of scar and radiation for ?malignancy   BACK SURGERY  07/2018   lost 2 inches   BREAST ENHANCEMENT SURGERY     BREAST IMPLANT EXCHANGE Right 06/23/2016   Procedure: RIGHT BREAST IMPLANT REMOVAL AND REPLACEMENT;  Surgeon: Elna Pick, MD;  Location: [facility];  Service: Plastics;  Laterality: Right;   COLONOSCOPY  2005   2 hyperplastic polyps   COLONOSCOPY  2007   Dr. Harvey- hemorrhoids   COLONOSCOPY  2008   Dr. Bernis polyp- rare sigmoid diverticulosis, internal hemorrhoids   COLONOSCOPY  12/01/2011   Procedure: COLONOSCOPY;  Surgeon: Margo LITTIE Harvey, MD;  [facility];  Service: Endoscopy;  Laterality: N/A;  12:30 PM   COLONOSCOPY WITH PROPOFOL  N/A 05/22/2022   Procedure: COLONOSCOPY WITH PROPOFOL ;  Surgeon: Cindie Carlin POUR, DO;  [facility];  Service: Endoscopy;  Laterality: N/A;  1:00 PM   CYSTOSCOPY/URETEROSCOPY/HOLMIUM LASER/STENT PLACEMENT Left 02/29/2020   Procedure: CYSTOSCOPY/URETEROSCOPY/HOLMIUM LASER/STENT PLACEMENT;  Surgeon: Devere Lonni Righter, MD;  Location: [facility];  Service: Urology;  Laterality: Left;   CYSTOSCOPY/URETEROSCOPY/HOLMIUM LASER/STENT PLACEMENT Left 07/08/2023   Procedure: CYSTOSCOPY/LEFT RETROGRADE PYELOGRAM/URETEROSCOPY/HOLMIUM LASER/STENT PLACEMENT;  Surgeon: Devere Lonni Righter, MD;  Location:  [facility];  Service: Urology;  Laterality: Left;  45 MINUTES NEEDED   epidural steroid injection     She is getting injections with Dr. Bonner q3wks.   ESOPHAGOGASTRODUODENOSCOPY (EGD) WITH ESOPHAGEAL DILATION N/A 04/15/2013   Procedure: ESOPHAGOGASTRODUODENOSCOPY (EGD) WITH ESOPHAGEAL DILATION;  Surgeon: Margo LITTIE Haddock, MD;  [facility];  Service: Endoscopy;  Laterality: N/A;  11:45-moved to 12:30 Anette Caldron to notify pt   EYE SURGERY  2010   EYE SURGERY     IR KYPHO LUMBAR INC FX REDUCE BONE BX UNI/BIL CANNULATION INC/IMAGING  08/23/2018   IR RADIOLOGIST EVAL & MGMT  12/23/2018   PACEMAKER IMPLANT N/A 01/28/2023   Procedure: PACEMAKER IMPLANT;  Surgeon: Waddell Danelle ORN, MD;  [facility];  Service: Cardiovascular;  Laterality: N/A;   POLYPECTOMY  05/22/2022   Procedure: POLYPECTOMY;  Surgeon: Cindie Carlin POUR, DO;  [facility];  Service: Endoscopy;;   right thumb surgery      Current Outpatient Medications  Medication Sig Dispense Refill   acetaminophen  (TYLENOL ) 650 MG CR tablet Take 650 mg by mouth every 8 (eight) hours as needed for pain.     albuterol  (VENTOLIN  HFA) 108 (90 Base) MCG/ACT inhaler Inhale 2 puffs into the lungs every 6 (six) hours as needed for wheezing or shortness of breath. 18 g 0   apixaban  (ELIQUIS ) 2.5 MG TABS tablet Take 1 tablet (2.5 mg total) by mouth 2 (two) times daily. 60 tablet 0   ezetimibe  (ZETIA ) 10 MG tablet Take 1 tablet (10 mg total) by mouth daily. 30 tablet 0   furosemide  (LASIX ) 20 MG tablet Take 3 tablets (60 mg total) by mouth daily. 90 tablet 0   levothyroxine  (SYNTHROID ) 50 MCG tablet Take 50 mcg by mouth daily before breakfast.     memantine  (NAMENDA ) 10 MG tablet Take 1 tablet (10 mg total) by mouth 2 (two) times daily. 60 tablet 0   metoprolol  tartrate (LOPRESSOR ) 50 MG tablet Take 1 tablet (50 mg total) by mouth 2 (two) times daily. 180 tablet 3   oxybutynin  (DITROPAN ) 5 MG tablet Take 1 tablet (5 mg total) by  mouth every 8 (eight) hours as needed for bladder spasms. 30 tablet 1   pantoprazole  (PROTONIX ) 40 MG tablet Take 1 tablet (40 mg total) by mouth daily. 30 tablet 0   phenazopyridine  (PYRIDIUM ) 200 MG tablet Take 1 tablet (200 mg total) by mouth 3 (three) times daily as needed (for pain with urination). 30 tablet 0   Potassium Chloride  ER 20 MEQ TBCR Take 1 tablet (20 mEq total) by mouth daily. 1 tab daily by mouth--- take while taking Lasix /furosemide  30 tablet 0   No current facility-administered medications for this visit.    Allergies as of 11/10/2023 - Review Complete 11/10/2023  Allergen Reaction Noted   Epinephrine  Anaphylaxis 11/18/2011   Demerol  [meperidine ] Other (See Comments) 11/18/2011   Morphine  sulfate Anxiety 01/26/2023   Vicodin [hydrocodone -acetaminophen ]  Other (See Comments) 11/18/2011    Family History  Problem Relation Age of Onset   Colon cancer Father        age 10   Prostate cancer Father    Pancreatic cancer Mother        age 35    Social History   Socioeconomic History   Marital status: Widowed    Spouse name: Not on file   Number of children: 1   Years of education: 39   Highest education level: 12th grade  Occupational History   Not on file  Tobacco Use   Smoking status: Never    Passive exposure: Never   Smokeless tobacco: Never  Vaping Use   Vaping status: Never Used  Substance and Sexual Activity   Alcohol use: No   Drug use: No   Sexual activity: Not Currently    Partners: Male  Other Topics Concern   Not on file  Social History Narrative   Lives alone   Right Handed    Drinks no caffeine daily   Social Drivers of Health   Financial Resource Strain: Low Risk  (02/06/2022)   Overall Financial Resource Strain (CARDIA)    Difficulty of Paying Living Expenses: Not hard at all  Food Insecurity: No Food Insecurity (07/08/2023)   Hunger Vital Sign    Worried About Running Out of Food in the Last Year: Never true    Ran Out of Food in  the Last Year: Never true  Transportation Needs: No Transportation Needs (07/08/2023)   PRAPARE - Administrator, Civil Service (Medical): No    Lack of Transportation (Non-Medical): No  Physical Activity: Inactive (02/06/2022)   Exercise Vital Sign    Days of Exercise per Week: 0 days    Minutes of Exercise per Session: 0 min  Stress: No Stress Concern Present (02/06/2022)   [employer] of Occupational Health - Occupational Stress Questionnaire    Feeling of Stress : Only a little  Social Connections: Moderately Integrated (07/08/2023)   Social Connection and Isolation Panel    Frequency of Communication with Friends and Family: More than three times a week    Frequency of Social Gatherings with Friends and Family: More than three times a week    Attends Religious Services: More than 4 times per year    Active Member of [employer] or Organizations: Yes    Attends Banker Meetings: More than 4 times per year    Marital Status: Widowed  Intimate Partner Violence: Not At Risk (07/08/2023)   Humiliation, Afraid, Rape, and Kick questionnaire    Fear of Current or Ex-Partner: No    Emotionally Abused: No    Physically Abused: No    Sexually Abused: No     Review of Systems   Gen: Denies any fever, chills, fatigue, weight loss, lack of appetite.  CV: Denies chest pain, heart palpitations, peripheral edema, syncope.  Resp: Denies shortness of breath at rest or with exertion. Denies wheezing or cough.  GI: Denies dysphagia or odynophagia. Denies jaundice, hematemesis, fecal incontinence. GU : Denies urinary burning, urinary frequency, urinary hesitancy MS: Denies joint pain, muscle weakness, cramps, or limitation of movement.  Derm: Denies rash, itching, dry skin Psych: Denies depression, anxiety, memory loss, and confusion Heme: Denies bruising, bleeding, and enlarged lymph nodes.   Physical Exam   BP 123/74   Pulse 68   Temp 98.4 F (36.9 C)   Ht 5'  (1.524 m)   Wt  134 lb 6.4 oz (61 kg)   BMI 26.25 kg/m  General:   Alert and oriented. Pleasant and cooperative. Well-nourished and well-developed.  Head:  Normocephalic and atraumatic. Eyes:  Without icterus Abdomen:  +BS, soft, TTP epigastric, non-tender and non-distended. No HSM noted. No guarding or rebound. No masses appreciated.  Rectal:  Deferred  Msk:  kyphosis  Extremities:  Without edema. Neurologic:  Alert and  oriented x4;  grossly normal neurologically. Skin:  Intact without significant lesions or rashes. Psych:  Alert and cooperative. Normal mood and affect.   Assessment   [name] is a 88 y.o. female presenting today with a history of   Early satiety Change in bowel habits  BLoating: appears worsened when sitting, likely due to kyphosis a   Gallbladder polyp: doesn't seem biliary related  Prior CTA negative  High anxiety   PLAN   *****    Cynthia MICAEL Stager, PhD, ANP-BC [facility]

## 2023-11-10 NOTE — Telephone Encounter (Signed)
  Request for patient to stop medication prior to procedure or is needing cleareance  11/10/23  [name] 06-14-1935  What type of surgery is being performed? Colonoscopy/EGD  When is surgery scheduled? TBD  What type of clearance is required (medical or pharmacy to hold medication or both? medication  Are there any medications that need to be held prior to surgery and how long? Eliquis  x 2 days  Name of physician performing surgery?  Dr.Charles Chryl [facility] Phone: [phone] Fax: [phone]  Anethesia type (none, local, MAC, general)? MAC

## 2023-11-11 ENCOUNTER — Telehealth: Payer: Self-pay | Admitting: Internal Medicine

## 2023-11-11 NOTE — Telephone Encounter (Signed)
 Calling to say the patient is having memorey issues. She is missing her afternoon dosage of medication. And sometimes double up because she is not sure if she taken them already. Planning to change her meteropol to extended release so it will be 1 daily instead of 2. Having concern with Eliquis  since it 2 twice a day, calling to see if she can be switch to xarelto to where it will be once a day.

## 2023-11-11 NOTE — Telephone Encounter (Signed)
 Spoke with [place] Dales and she states patient is having cognitive issues. She is missing her second dose of medications a lot during the week. They were thinking of changing her metoprolol  tartrate to metoprolol  XL and changing her memantine  to once a day.  They would like to know if you are ok with changing her from eliquis  to xarelto so she can only have to take it once a day.

## 2023-11-12 NOTE — Telephone Encounter (Signed)
     Primary Cardiologist: Armida Lander, MD  Clinical pharmacist have reviewed patient's chart and provided the following recommendations for , [name] :  CHA2DS2-VASc Score = 8   This indicates a 10.8% annual risk of stroke. The patient's score is based upon: CHF History: 1 HTN History: 1 Diabetes History: 0 Stroke History: 2 Vascular Disease History: 1 Age Score: 2 Gender Score: 1   (Was reviewed with MD in January, no bridging required at that time)   CrCl 32 Platelet count 279   Patient has not had an Afib/aflutter ablation within the last 3 months or DCCV within the last 30 days   Per office protocol, patient can hold Eliquis  for 2 days prior to procedure.   Patient will not need bridging with Lovenox  (enoxaparin ) around procedure.    I will route this recommendation to the requesting party via Epic fax function and remove from pre-op pool.  Please call with questions.  Chet Cota. Kevante Lunt NP-C     11/12/2023, 1:26 PM [facility] [address] Office [phone] Fax [phone]

## 2023-11-12 NOTE — Telephone Encounter (Signed)
 Patient with diagnosis of atrial fibrillation on Eliquis  for anticoagulation.    What type of surgery is being performed? Colonoscopy/EGD   When is surgery scheduled? TBD   CHA2DS2-VASc Score = 8   This indicates a 10.8% annual risk of stroke. The patient's score is based upon: CHF History: 1 HTN History: 1 Diabetes History: 0 Stroke History: 2 Vascular Disease History: 1 Age Score: 2 Gender Score: 1   (Was reviewed with MD in January, no bridging required at that time)  CrCl 32 Platelet count 279  Patient has not had an Afib/aflutter ablation within the last 3 months or DCCV within the last 30 days  Per office protocol, patient can hold Eliquis  for 2 days prior to procedure.   Patient will not need bridging with Lovenox  (enoxaparin ) around procedure.  **This guidance is not considered finalized until pre-operative APP has relayed final recommendations.**

## 2023-11-12 NOTE — Telephone Encounter (Signed)
 Ok to switch to toprol . 50 mg daily

## 2023-11-13 DIAGNOSIS — I480 Paroxysmal atrial fibrillation: Secondary | ICD-10-CM | POA: Diagnosis not present

## 2023-11-13 DIAGNOSIS — E870 Hyperosmolality and hypernatremia: Secondary | ICD-10-CM | POA: Diagnosis not present

## 2023-11-13 DIAGNOSIS — J4530 Mild persistent asthma, uncomplicated: Secondary | ICD-10-CM | POA: Diagnosis not present

## 2023-11-13 DIAGNOSIS — K219 Gastro-esophageal reflux disease without esophagitis: Secondary | ICD-10-CM | POA: Diagnosis not present

## 2023-11-13 DIAGNOSIS — I10 Essential (primary) hypertension: Secondary | ICD-10-CM | POA: Diagnosis not present

## 2023-11-13 DIAGNOSIS — K5909 Other constipation: Secondary | ICD-10-CM | POA: Diagnosis not present

## 2023-11-13 DIAGNOSIS — I495 Sick sinus syndrome: Secondary | ICD-10-CM | POA: Diagnosis not present

## 2023-11-13 DIAGNOSIS — F03B3 Unspecified dementia, moderate, with mood disturbance: Secondary | ICD-10-CM | POA: Diagnosis not present

## 2023-11-13 DIAGNOSIS — F331 Major depressive disorder, recurrent, moderate: Secondary | ICD-10-CM | POA: Diagnosis not present

## 2023-11-13 DIAGNOSIS — E039 Hypothyroidism, unspecified: Secondary | ICD-10-CM | POA: Diagnosis not present

## 2023-11-13 DIAGNOSIS — E782 Mixed hyperlipidemia: Secondary | ICD-10-CM | POA: Diagnosis not present

## 2023-11-13 DIAGNOSIS — Z8673 Personal history of transient ischemic attack (TIA), and cerebral infarction without residual deficits: Secondary | ICD-10-CM | POA: Diagnosis not present

## 2023-11-13 NOTE — Telephone Encounter (Signed)
 Ok to switch to xarelto 15 mg daily to help with compliance. GT

## 2023-11-13 NOTE — Telephone Encounter (Signed)
 Left message for [name] to call back.

## 2023-11-13 NOTE — Telephone Encounter (Signed)
 [name] is calling to find out about the Eliquis  question

## 2023-11-16 NOTE — Telephone Encounter (Signed)
 May go ahead and arrange procedures with Dr. Cindie (EGD and colonoscopy as planned).  Hold Eliquis  2 days prior.

## 2023-11-17 ENCOUNTER — Other Ambulatory Visit: Payer: Self-pay | Admitting: *Deleted

## 2023-11-17 ENCOUNTER — Encounter: Payer: Self-pay | Admitting: *Deleted

## 2023-11-17 DIAGNOSIS — R140 Abdominal distension (gaseous): Secondary | ICD-10-CM | POA: Diagnosis not present

## 2023-11-17 DIAGNOSIS — J069 Acute upper respiratory infection, unspecified: Secondary | ICD-10-CM | POA: Diagnosis not present

## 2023-11-17 MED ORDER — PEG 3350-KCL-NA BICARB-NACL 420 G PO SOLR: 4000.0000 mL | Freq: Once | ORAL | 0 refills | Status: AC

## 2023-11-17 NOTE — Telephone Encounter (Signed)
 Pt has been scheduled for 12/07/23. Instructions mailed and prep sent to pharmacy

## 2023-11-23 DIAGNOSIS — I5032 Chronic diastolic (congestive) heart failure: Secondary | ICD-10-CM | POA: Diagnosis not present

## 2023-11-23 DIAGNOSIS — I480 Paroxysmal atrial fibrillation: Secondary | ICD-10-CM | POA: Diagnosis not present

## 2023-11-23 DIAGNOSIS — I10 Essential (primary) hypertension: Secondary | ICD-10-CM | POA: Diagnosis not present

## 2023-11-23 DIAGNOSIS — F03B3 Unspecified dementia, moderate, with mood disturbance: Secondary | ICD-10-CM | POA: Diagnosis not present

## 2023-12-01 DIAGNOSIS — I10 Essential (primary) hypertension: Secondary | ICD-10-CM | POA: Diagnosis not present

## 2023-12-01 DIAGNOSIS — K5909 Other constipation: Secondary | ICD-10-CM | POA: Diagnosis not present

## 2023-12-01 DIAGNOSIS — E870 Hyperosmolality and hypernatremia: Secondary | ICD-10-CM | POA: Diagnosis not present

## 2023-12-01 DIAGNOSIS — F331 Major depressive disorder, recurrent, moderate: Secondary | ICD-10-CM | POA: Diagnosis not present

## 2023-12-01 DIAGNOSIS — E039 Hypothyroidism, unspecified: Secondary | ICD-10-CM | POA: Diagnosis not present

## 2023-12-01 DIAGNOSIS — J4530 Mild persistent asthma, uncomplicated: Secondary | ICD-10-CM | POA: Diagnosis not present

## 2023-12-01 DIAGNOSIS — Z8673 Personal history of transient ischemic attack (TIA), and cerebral infarction without residual deficits: Secondary | ICD-10-CM | POA: Diagnosis not present

## 2023-12-01 DIAGNOSIS — I495 Sick sinus syndrome: Secondary | ICD-10-CM | POA: Diagnosis not present

## 2023-12-01 DIAGNOSIS — F03B3 Unspecified dementia, moderate, with mood disturbance: Secondary | ICD-10-CM | POA: Diagnosis not present

## 2023-12-01 DIAGNOSIS — K219 Gastro-esophageal reflux disease without esophagitis: Secondary | ICD-10-CM | POA: Diagnosis not present

## 2023-12-01 DIAGNOSIS — E782 Mixed hyperlipidemia: Secondary | ICD-10-CM | POA: Diagnosis not present

## 2023-12-01 DIAGNOSIS — I480 Paroxysmal atrial fibrillation: Secondary | ICD-10-CM | POA: Diagnosis not present

## 2023-12-01 NOTE — Patient Instructions (Signed)
 [name]  12/01/2023     @PREFPERIOPPHARMACY @   Your procedure is scheduled on  12/07/2023.   [facility] to [facility] at  1145  A.M.   Call this number if you have problems the morning of surgery:  [phone]  If you experience any cold or flu symptoms such as cough, fever, chills, shortness of breath, etc. between now and your scheduled surgery, please notify us  at the above number.   Remember:        Your last dose of eliquis  should be on 12/04/2023.   Follow the diet and prep instructions given to you by the office.   You may drink clear liquids until  0945 am on 12/07/2023.     Clear liquids allowed are:                    Water , Juice (No red color; non-citric and without pulp; diabetics please choose diet or no sugar options), Carbonated beverages (diabetics please choose diet or no sugar options), Clear Tea (No creamer, milk, or cream, including half & half and powdered creamer), Black Coffee Only (No creamer, milk or cream, including half & half and powdered creamer), and Clear Sports drink (No red color; diabetics please choose diet or no sugar options)    Take these medicines the morning of surgery with A SIP OF WATER                 levothyroxine , memantine , metoprolol , pantoprazole .    Do not wear jewelry, make-up or nail polish, including gel polish,  artificial nails, or any other type of covering on natural nails (fingers and  toes).  Do not wear lotions, powders, or perfumes, or deodorant.  Do not shave 48 hours prior to surgery.  Men may shave face and neck.  Do not bring valuables to the hospital.  [facility] is not responsible for any belongings or valuables.  Contacts, dentures or bridgework may not be worn into surgery.  Leave your suitcase in the car.  After surgery it may be brought to your room.  For patients admitted to the hospital, discharge time will be determined by your treatment team.  Patients discharged the day of surgery  will not be allowed to drive home and must have someone with them for 24 hours.    Special instructions:   DO NOT smoke tobacco or vape for 24 hours before your procedure.  Please read over the following fact sheets that you were given. Anesthesia Post-op Instructions and Care and Recovery After Surgery      Upper Endoscopy, Adult, Care After After the procedure, it is common to have a sore throat. It is also common to have: Mild stomach pain or discomfort. Bloating. Nausea. Follow these instructions at home: The instructions below may help you care for yourself at home. Your health care provider may give you more instructions. If you have questions, ask your health care provider. If you were given a sedative during the procedure, it can affect you for several hours. Do not drive or operate machinery until your health care provider says that it is safe. If you will be going home right after the procedure, plan to have a responsible adult: Take you home from the hospital or clinic. You will not be allowed to drive. Care for you for the time you are told. Follow instructions from your health care provider about what you may eat and drink. Return to  your normal activities as told by your health care provider. Ask your health care provider what activities are safe for you. Take over-the-counter and prescription medicines only as told by your health care provider. Contact a health care provider if you: Have a sore throat that lasts longer than one day. Have trouble swallowing. Have a fever. Get help right away if you: Vomit blood or your vomit looks like coffee grounds. Have bloody, black, or tarry stools. Have a very bad sore throat or you cannot swallow. Have difficulty breathing or very bad pain in your chest or abdomen. These symptoms may be an emergency. Get help right away. Call 911. Do not wait to see if the symptoms will go away. Do not drive yourself to the  hospital. Summary After the procedure, it is common to have a sore throat, mild stomach discomfort, bloating, and nausea. If you were given a sedative during the procedure, it can affect you for several hours. Do not drive until your health care provider says that it is safe. Follow instructions from your health care provider about what you may eat and drink. Return to your normal activities as told by your health care provider. This information is not intended to replace advice given to you by your health care provider. Make sure you discuss any questions you have with your health care provider. Document Revised: 08/21/2021 Document Reviewed: 08/21/2021 Elsevier Patient Education  2024 Elsevier Inc.Colonoscopy, Adult, Care After The following information offers guidance on how to care for yourself after your procedure. Your health care provider may also give you more specific instructions. If you have problems or questions, contact your health care provider. What can I expect after the procedure? After the procedure, it is common to have: A small amount of blood in your stool for 24 hours after the procedure. Some gas. Mild cramping or bloating of your abdomen. Follow these instructions at home: Eating and drinking  Drink enough fluid to keep your urine pale yellow. Follow instructions from your health care provider about eating or drinking restrictions. Resume your normal diet as told by your health care provider. Avoid heavy or fried foods that are hard to digest. Activity Rest as told by your health care provider. Avoid sitting for a long time without moving. Get up to take short walks every 1-2 hours. This is important to improve blood flow and breathing. Ask for help if you feel weak or unsteady. Return to your normal activities as told by your health care provider. Ask your health care provider what activities are safe for you. Managing cramping and bloating  Try walking around  when you have cramps or feel bloated. If directed, apply heat to your abdomen as told by your health care provider. Use the heat source that your health care provider recommends, such as a moist heat pack or a heating pad. Place a towel between your skin and the heat source. Leave the heat on for 20-30 minutes. Remove the heat if your skin turns bright red. This is especially important if you are unable to feel pain, heat, or cold. You have a greater risk of getting burned. General instructions If you were given a sedative during the procedure, it can affect you for several hours. Do not drive or operate machinery until your health care provider says that it is safe. For the first 24 hours after the procedure: Do not sign important documents. Do not drink alcohol. Do your regular daily activities at a slower pace than  normal. Eat soft foods that are easy to digest. Take over-the-counter and prescription medicines only as told by your health care provider. Keep all follow-up visits. This is important. Contact a health care provider if: You have blood in your stool 2-3 days after the procedure. Get help right away if: You have more than a small spotting of blood in your stool. You have large blood clots in your stool. You have swelling of your abdomen. You have nausea or vomiting. You have a fever. You have increasing pain in your abdomen that is not relieved with medicine. These symptoms may be an emergency. Get help right away. Call 911. Do not wait to see if the symptoms will go away. Do not drive yourself to the hospital. Summary After the procedure, it is common to have a small amount of blood in your stool. You may also have mild cramping and bloating of your abdomen. If you were given a sedative during the procedure, it can affect you for several hours. Do not drive or operate machinery until your health care provider says that it is safe. Get help right away if you have a lot of  blood in your stool, nausea or vomiting, a fever, or increased pain in your abdomen. This information is not intended to replace advice given to you by your health care provider. Make sure you discuss any questions you have with your health care provider. Document Revised: 06/24/2022 Document Reviewed: 01/02/2021 Elsevier Patient Education  2024 Elsevier Inc.General Anesthesia, Adult, Care After The following information offers guidance on how to care for yourself after your procedure. Your health care provider may also give you more specific instructions. If you have problems or questions, contact your health care provider. What can I expect after the procedure? After the procedure, it is common for people to: Have pain or discomfort at the IV site. Have nausea or vomiting. Have a sore throat or hoarseness. Have trouble concentrating. Feel cold or chills. Feel weak, sleepy, or tired (fatigue). Have soreness and body aches. These can affect parts of the body that were not involved in surgery. Follow these instructions at home: For the time period you were told by your health care provider:  Rest. Do not participate in activities where you could fall or become injured. Do not drive or use machinery. Do not drink alcohol. Do not take sleeping pills or medicines that cause drowsiness. Do not make important decisions or sign legal documents. Do not take care of children on your own. General instructions Drink enough fluid to keep your urine pale yellow. If you have sleep apnea, surgery and certain medicines can increase your risk for breathing problems. Follow instructions from your health care provider about wearing your sleep device: Anytime you are sleeping, including during daytime naps. While taking prescription pain medicines, sleeping medicines, or medicines that make you drowsy. Return to your normal activities as told by your health care provider. Ask your health care provider what  activities are safe for you. Take over-the-counter and prescription medicines only as told by your health care provider. Do not use any products that contain nicotine or tobacco. These products include cigarettes, chewing tobacco, and vaping devices, such as e-cigarettes. These can delay incision healing after surgery. If you need help quitting, ask your health care provider. Contact a health care provider if: You have nausea or vomiting that does not get better with medicine. You vomit every time you eat or drink. You have pain that does not get  better with medicine. You cannot urinate or have bloody urine. You develop a skin rash. You have a fever. Get help right away if: You have trouble breathing. You have chest pain. You vomit blood. These symptoms may be an emergency. Get help right away. Call 911. Do not wait to see if the symptoms will go away. Do not drive yourself to the hospital. Summary After the procedure, it is common to have a sore throat, hoarseness, nausea, vomiting, or to feel weak, sleepy, or fatigue. For the time period you were told by your health care provider, do not drive or use machinery. Get help right away if you have difficulty breathing, have chest pain, or vomit blood. These symptoms may be an emergency. This information is not intended to replace advice given to you by your health care provider. Make sure you discuss any questions you have with your health care provider. Document Revised: 08/09/2021 Document Reviewed: 08/09/2021 Elsevier Patient Education  2024 [employer].

## 2023-12-02 ENCOUNTER — Encounter (HOSPITAL_COMMUNITY)
Admission: RE | Admit: 2023-12-02 | Discharge: 2023-12-02 | Disposition: A | Discharge status: Home / Self Care | Source: Ambulatory Visit | Attending: Internal Medicine | Admitting: Internal Medicine

## 2023-12-02 ENCOUNTER — Encounter (HOSPITAL_COMMUNITY): Payer: Self-pay

## 2023-12-02 VITALS — BP 132/59 | HR 61 | Resp 18 | Ht 60.0 in | Wt 134.5 lb

## 2023-12-02 DIAGNOSIS — Z01812 Encounter for preprocedural laboratory examination: Secondary | ICD-10-CM | POA: Diagnosis not present

## 2023-12-02 DIAGNOSIS — D631 Anemia in chronic kidney disease: Secondary | ICD-10-CM | POA: Diagnosis not present

## 2023-12-02 DIAGNOSIS — N184 Chronic kidney disease, stage 4 (severe): Secondary | ICD-10-CM | POA: Diagnosis not present

## 2023-12-02 LAB — BASIC METABOLIC PANEL WITH GFR
Anion gap: 14 (ref 5–15)
BUN: 28 mg/dL — ABNORMAL HIGH (ref 8–23)
CO2: 25 mmol/L (ref 22–32)
Calcium: 9.1 mg/dL (ref 8.9–10.3)
Chloride: 103 mmol/L (ref 98–111)
Creatinine, Ser: 1.33 mg/dL — ABNORMAL HIGH (ref 0.44–1.00)
GFR, Estimated: 39 mL/min — ABNORMAL LOW (ref >60)
Glucose, Bld: 92 mg/dL (ref 70–99)
Potassium: 4.1 mmol/L (ref 3.5–5.1)
Sodium: 142 mmol/L (ref 135–145)

## 2023-12-02 LAB — CBC WITH DIFFERENTIAL/PLATELET
Abs Immature Granulocytes: 0.01 K/uL (ref 0.00–0.07)
Basophils Absolute: 0.1 K/uL (ref 0.0–0.1)
Basophils Relative: 1 %
Eosinophils Absolute: 0.2 K/uL (ref 0.0–0.5)
Eosinophils Relative: 3 %
HCT: 43.9 % (ref 36.0–46.0)
Hemoglobin: 14.4 g/dL (ref 12.0–15.0)
Immature Granulocytes: 0 %
Lymphocytes Relative: 31 %
Lymphs Abs: 2.3 K/uL (ref 0.7–4.0)
MCH: 29 pg (ref 26.0–34.0)
MCHC: 32.8 g/dL (ref 30.0–36.0)
MCV: 88.5 fL (ref 80.0–100.0)
Monocytes Absolute: 0.8 K/uL (ref 0.1–1.0)
Monocytes Relative: 10 %
Neutro Abs: 4.1 K/uL (ref 1.7–7.7)
Neutrophils Relative %: 55 %
Platelets: 200 K/uL (ref 150–400)
RBC: 4.96 MIL/uL (ref 3.87–5.11)
RDW: 14.6 % (ref 11.5–15.5)
WBC: 7.4 K/uL (ref 4.0–10.5)
nRBC: 0 % (ref 0.0–0.2)

## 2023-12-03 ENCOUNTER — Encounter: Payer: Self-pay | Admitting: Internal Medicine

## 2023-12-03 NOTE — Progress Notes (Signed)
 PERIOPERATIVE PRESCRIPTION FOR IMPLANTED CARDIAC DEVICE PROGRAMMING  Patient Information: Name:  [name]  DOB:  1935-11-25  MRN:  [id number]   Planned Procedure:   Colonoscopy and EGD Surgeon:  Cindie Carlin POUR, DO  Date of Procedure: 12/07/2023 Position during surgery:  left lateral   Device Information:  Clinic EP Physician:  Danelle Birmingham, MD   Device Type:  Pacemaker Manufacturer and Phone #:  [employer]: [phone] Pacemaker Dependent?:  No. Date of Last Device Check:  10/30/2023 Normal Device Function?:  Yes.    Electrophysiologist's Recommendations:  Have magnet available. Provide continuous ECG monitoring when magnet is used or reprogramming is to be performed.  Procedure should not interfere with device function.  No device programming or magnet placement needed.  Per Device Clinic Standing Orders, Delon DELENA Sharps, RN  1:37 PM 12/03/2023

## 2023-12-07 ENCOUNTER — Encounter (HOSPITAL_COMMUNITY)
Admission: RE | Disposition: A | Discharge status: Home / Self Care | Payer: Self-pay | Source: Home / Self Care | Attending: Internal Medicine

## 2023-12-07 ENCOUNTER — Ambulatory Visit (HOSPITAL_COMMUNITY): Admitting: Anesthesiology

## 2023-12-07 ENCOUNTER — Ambulatory Visit (HOSPITAL_COMMUNITY)
Admission: RE | Admit: 2023-12-07 | Discharge: 2023-12-07 | Disposition: A | Discharge status: Home / Self Care | Attending: Internal Medicine | Admitting: Internal Medicine

## 2023-12-07 ENCOUNTER — Encounter (HOSPITAL_COMMUNITY): Payer: Self-pay | Admitting: Internal Medicine

## 2023-12-07 DIAGNOSIS — K317 Polyp of stomach and duodenum: Secondary | ICD-10-CM | POA: Diagnosis not present

## 2023-12-07 DIAGNOSIS — Z950 Presence of cardiac pacemaker: Secondary | ICD-10-CM | POA: Insufficient documentation

## 2023-12-07 DIAGNOSIS — D123 Benign neoplasm of transverse colon: Secondary | ICD-10-CM | POA: Diagnosis not present

## 2023-12-07 DIAGNOSIS — I110 Hypertensive heart disease with heart failure: Secondary | ICD-10-CM | POA: Insufficient documentation

## 2023-12-07 DIAGNOSIS — I2510 Atherosclerotic heart disease of native coronary artery without angina pectoris: Secondary | ICD-10-CM | POA: Diagnosis not present

## 2023-12-07 DIAGNOSIS — Z800 Family history of malignant neoplasm of digestive organs: Secondary | ICD-10-CM | POA: Insufficient documentation

## 2023-12-07 DIAGNOSIS — D122 Benign neoplasm of ascending colon: Secondary | ICD-10-CM | POA: Insufficient documentation

## 2023-12-07 DIAGNOSIS — Z09 Encounter for follow-up examination after completed treatment for conditions other than malignant neoplasm: Secondary | ICD-10-CM | POA: Diagnosis not present

## 2023-12-07 DIAGNOSIS — K635 Polyp of colon: Secondary | ICD-10-CM | POA: Diagnosis not present

## 2023-12-07 DIAGNOSIS — Z860100 Personal history of colon polyps, unspecified: Secondary | ICD-10-CM

## 2023-12-07 DIAGNOSIS — G47419 Narcolepsy without cataplexy: Secondary | ICD-10-CM | POA: Diagnosis not present

## 2023-12-07 DIAGNOSIS — Z1211 Encounter for screening for malignant neoplasm of colon: Secondary | ICD-10-CM | POA: Diagnosis not present

## 2023-12-07 DIAGNOSIS — R6881 Early satiety: Secondary | ICD-10-CM

## 2023-12-07 DIAGNOSIS — K5730 Diverticulosis of large intestine without perforation or abscess without bleeding: Secondary | ICD-10-CM | POA: Diagnosis not present

## 2023-12-07 DIAGNOSIS — K219 Gastro-esophageal reflux disease without esophagitis: Secondary | ICD-10-CM | POA: Diagnosis not present

## 2023-12-07 DIAGNOSIS — I509 Heart failure, unspecified: Secondary | ICD-10-CM | POA: Insufficient documentation

## 2023-12-07 DIAGNOSIS — J45909 Unspecified asthma, uncomplicated: Secondary | ICD-10-CM | POA: Diagnosis not present

## 2023-12-07 DIAGNOSIS — I4891 Unspecified atrial fibrillation: Secondary | ICD-10-CM | POA: Diagnosis not present

## 2023-12-07 HISTORY — PX: ESOPHAGOGASTRODUODENOSCOPY: SHX5428

## 2023-12-07 HISTORY — PX: COLONOSCOPY: SHX5424

## 2023-12-07 SURGERY — COLONOSCOPY
Anesthesia: General

## 2023-12-07 MED ORDER — PROPOFOL 500 MG/50ML IV EMUL
INTRAVENOUS | Status: DC | PRN
  Administered 2023-12-07: 100 ug/kg/min via INTRAVENOUS

## 2023-12-07 MED ORDER — PROPOFOL 10 MG/ML IV BOLUS
INTRAVENOUS | Status: DC | PRN
Start: 2023-12-07 — End: 2023-12-07
  Administered 2023-12-07 (×2): 30 mg via INTRAVENOUS

## 2023-12-07 MED ORDER — LACTATED RINGERS IV SOLN: INTRAVENOUS | Status: DC

## 2023-12-07 MED ORDER — LIDOCAINE 2% (20 MG/ML) 5 ML SYRINGE
INTRAMUSCULAR | Status: DC | PRN
  Administered 2023-12-07: 50 mg via INTRAVENOUS

## 2023-12-07 NOTE — Progress Notes (Signed)
 Pt unaware of what meds she has taken today. Stated [employer] pre packages her meds. Talked with Melody, pharmacist at [facility]. She stated that pt is now taking xarelto and her last dose was 12/04/23.

## 2023-12-07 NOTE — Anesthesia Preprocedure Evaluation (Signed)
 Anesthesia Evaluation  Patient identified by MRN, date of birth, ID band Patient awake    Reviewed: Allergy  & Precautions, NPO status , Patient's Chart, lab work & pertinent test results, reviewed documented beta blocker date and time   History of Anesthesia Complications (+) PONV and history of anesthetic complications (pt reports TIA and sezures after surgery)  Airway Mallampati: II  TM Distance: >3 FB Neck ROM: Full    Dental  (+) Dental Advisory Given, Caps   Pulmonary asthma    Pulmonary exam normal breath sounds clear to auscultation       Cardiovascular Exercise Tolerance: Good hypertension, Pt. on medications and Pt. on home beta blockers + CAD and +CHF  Normal cardiovascular exam+ dysrhythmias Atrial Fibrillation + pacemaker  Rhythm:Regular Rate:Normal  1. Left ventricular ejection fraction, by estimation, is 60 to 65%. The  left ventricle has normal function. The left ventricle has no regional  wall motion abnormalities. There is mild left ventricular hypertrophy.  Left ventricular diastolic parameters  are consistent with Grade III diastolic dysfunction (restrictive).   2. Right ventricular systolic function is normal. The right ventricular  size is normal.   3. Left atrial size was mildly dilated.   4. Mitral Valve Area (MVA) = 1.76 cm2. The mitral valve is degenerative.  Mild mitral valve regurgitation. Mild mitral stenosis. Moderate mitral  annular calcification.   5. The aortic valve was not assessed. Aortic valve regurgitation is not  visualized.     Neuro/Psych  PSYCHIATRIC DISORDERS Anxiety Depression    TIACVA (right sided weakness), Residual Symptoms    GI/Hepatic ,GERD  Medicated and Controlled,,  Endo/Other  Hypothyroidism    Renal/GU Renal InsufficiencyRenal disease     Musculoskeletal  (+) Arthritis , Osteoarthritis,    Abdominal   Peds  Hematology   Anesthesia Other  Findings Narcolepsy  Reproductive/Obstetrics                              Anesthesia Physical Anesthesia Plan  ASA: 3  Anesthesia Plan: General   Post-op Pain Management: Minimal or no pain anticipated   Induction: Intravenous  PONV Risk Score and Plan: Propofol  infusion  Airway Management Planned: Nasal Cannula and Natural Airway  Additional Equipment: None  Intra-op Plan:   Post-operative Plan:   Informed Consent: I have reviewed the patients History and Physical, chart, labs and discussed the procedure including the risks, benefits and alternatives for the proposed anesthesia with the patient or authorized representative who has indicated his/her understanding and acceptance.     Dental advisory given  Plan Discussed with: CRNA  Anesthesia Plan Comments:         Anesthesia Quick Evaluation

## 2023-12-07 NOTE — Interval H&P Note (Signed)
 History and Physical Interval Note:  12/07/2023 1:41 PM  [name]  has presented today for surgery, with the diagnosis of change in bowels,early satiety.  The various methods of treatment have been discussed with the patient and family. After consideration of risks, benefits and other options for treatment, the patient has consented to  Procedure(s) with comments: COLONOSCOPY (N/A) - 1:45 pm, asa 3 EGD (ESOPHAGOGASTRODUODENOSCOPY) (N/A) as a surgical intervention.  The patient's history has been reviewed, patient examined, no change in status, stable for surgery.  I have reviewed the patient's chart and labs.  Questions were answered to the patient's satisfaction.     Carlin MARLA Hasty

## 2023-12-07 NOTE — Anesthesia Postprocedure Evaluation (Signed)
 Anesthesia Post Note  Patient: [name]  Procedure(s) Performed: COLONOSCOPY EGD (ESOPHAGOGASTRODUODENOSCOPY)  Patient location during evaluation: Phase II Anesthesia Type: General Level of consciousness: awake and alert Pain management: pain level controlled Vital Signs Assessment: post-procedure vital signs reviewed and stable Respiratory status: spontaneous breathing, nonlabored ventilation and respiratory function stable Cardiovascular status: blood pressure returned to baseline and stable Postop Assessment: no apparent nausea or vomiting Anesthetic complications: no   There were no known notable events for this encounter.   Last Vitals:  Vitals:   12/07/23 1330 12/07/23 1516  BP:  (!) 88/53  Pulse: 68 60  Resp: 14 14  Temp:  (!) 36.4 C  SpO2: 100% 98%    Last Pain:  Vitals:   12/07/23 1516  TempSrc: Oral  PainSc: 0-No pain                 Kourtnei Rauber L Lajoya Dombek

## 2023-12-07 NOTE — Op Note (Signed)
 [facility] Patient Name: [name] Procedure Date: 12/07/2023 2:19 PM MRN: [id number] Date of Birth: 1936/01/13 Attending MD: Carlin POUR. Cindie , [place], [id number] CSN: [id number] Age: 88 Admit Type: Outpatient Procedure:                Colonoscopy Indications:              High risk colon cancer surveillance: Personal                            history of colonic polyps Providers:                Carlin POUR. Cindie, DO, Crystal Page, Daphne Mulch                            Technician, Technician Referring MD:              Medicines:                See the Anesthesia note for documentation of the                            administered medications Complications:            No immediate complications. Estimated Blood Loss:     Estimated blood loss was minimal. Procedure:                Pre-Anesthesia Assessment:                           - The anesthesia plan was to use monitored                            anesthesia care (MAC).                           After obtaining informed consent, the colonoscope                            was passed under direct vision. Throughout the                            procedure, the patient's blood pressure, pulse, and                            oxygen  saturations were monitored continuously. The                            [id number]-HQ190L ([id number]) scope was introduced through                            the anus and advanced to the the cecum, identified                            by appendiceal orifice and ileocecal valve. The                            colonoscopy was somewhat difficult due to  multiple                            diverticula in the colon. The patient tolerated the                            procedure well. The quality of the bowel                            preparation was evaluated using the BBPS [facility]                            Bowel Preparation Scale) with scores of: Right                            Colon = 3, Transverse  Colon = 3 and Left Colon = 3                            (entire mucosa seen well with no residual staining,                            small fragments of stool or opaque liquid). The                            total BBPS score equals 9. Scope In: 2:49:48 PM Scope Out: 3:09:52 PM Scope Withdrawal Time: 0 hours 12 minutes 53 seconds  Total Procedure Duration: 0 hours 20 minutes 4 seconds  Findings:      Multiple medium-mouthed and small-mouthed diverticula were found in the       sigmoid colon and descending colon.      An 8 mm polyp was found in the ascending colon. The polyp was sessile.       The polyp was removed with a cold snare. Resection and retrieval were       complete.      A 5 mm polyp was found in the transverse colon. The polyp was sessile.       The polyp was removed with a cold snare. Resection and retrieval were       complete.      The exam was otherwise without abnormality. Impression:               - Diverticulosis in the sigmoid colon and in the                            descending colon.                           - One 8 mm polyp in the ascending colon, removed                            with a cold snare. Resected and retrieved.                           - One 5 mm polyp in the transverse colon, removed  with a cold snare. Resected and retrieved.                           - The examination was otherwise normal. Moderate Sedation:      Per Anesthesia Care Recommendation:           - Patient has a contact number available for                            emergencies. The signs and symptoms of potential                            delayed complications were discussed with the                            patient. Return to normal activities tomorrow.                            Written discharge instructions were provided to the                            patient.                           - Resume previous diet.                           -  Continue present medications.                           - Await pathology results.                           - No repeat colonoscopy due to age.                           - Return to GI clinic in 3 months. Procedure Code(s):        --- Professional ---                           [phone], Colonoscopy, flexible; with removal of                            tumor(s), polyp(s), or other lesion(s) by snare                            technique Diagnosis Code(s):        --- Professional ---                           Z86.010, Personal history of colonic polyps                           D12.2, Benign neoplasm of ascending colon                           D12.3, Benign neoplasm of  transverse colon (hepatic                            flexure or splenic flexure)                           K57.30, Diverticulosis of large intestine without                            perforation or abscess without bleeding CPT copyright 2022 American Medical Association. All rights reserved. The codes documented in this report are preliminary and upon coder review may  be revised to meet current compliance requirements. Carlin POUR. Cindie, DO Carlin POUR. Cindie, DO 12/07/2023 3:17:16 PM This report has been signed electronically. Number of Addenda: 0

## 2023-12-07 NOTE — Discharge Instructions (Addendum)
 EGD Discharge instructions Please read the instructions outlined below and refer to this sheet in the next few weeks. These discharge instructions provide you with general information on caring for yourself after you leave the hospital. Your doctor may also give you specific instructions. While your treatment has been planned according to the most current medical practices available, unavoidable complications occasionally occur. If you have any problems or questions after discharge, please call your doctor. ACTIVITY You may resume your regular activity but move at a slower pace for the next 24 hours.  Take frequent rest periods for the next 24 hours.  Walking will help expel (get rid of) the air and reduce the bloated feeling in your abdomen.  No driving for 24 hours (because of the anesthesia (medicine) used during the test).  You may shower.  Do not sign any important legal documents or operate any machinery for 24 hours (because of the anesthesia used during the test).  NUTRITION Drink plenty of fluids.  You may resume your normal diet.  Begin with a light meal and progress to your normal diet.  Avoid alcoholic beverages for 24 hours or as instructed by your caregiver.  MEDICATIONS You may resume your normal medications unless your caregiver tells you otherwise.  WHAT YOU CAN EXPECT TODAY You may experience abdominal discomfort such as a feeling of fullness or "gas" pains.  FOLLOW-UP Your doctor will discuss the results of your test with you.  SEEK IMMEDIATE MEDICAL ATTENTION IF ANY OF THE FOLLOWING OCCUR: Excessive nausea (feeling sick to your stomach) and/or vomiting.  Severe abdominal pain and distention (swelling).  Trouble swallowing.  Temperature over 101 F (37.8 C).  Rectal bleeding or vomiting of blood.     Colonoscopy Discharge Instructions  Read the instructions outlined below and refer to this sheet in the next few weeks. These discharge instructions provide you with  general information on caring for yourself after you leave the hospital. Your doctor may also give you specific instructions. While your treatment has been planned according to the most current medical practices available, unavoidable complications occasionally occur.   ACTIVITY You may resume your regular activity, but move at a slower pace for the next 24 hours.  Take frequent rest periods for the next 24 hours.  Walking will help get rid of the air and reduce the bloated feeling in your belly (abdomen).  No driving for 24 hours (because of the medicine (anesthesia) used during the test).   Do not sign any important legal documents or operate any machinery for 24 hours (because of the anesthesia used during the test).  NUTRITION Drink plenty of fluids.  You may resume your normal diet as instructed by your doctor.  Begin with a light meal and progress to your normal diet. Heavy or fried foods are harder to digest and may make you feel sick to your stomach (nauseated).  Avoid alcoholic beverages for 24 hours or as instructed.  MEDICATIONS You may resume your normal medications unless your doctor tells you otherwise.  WHAT YOU CAN EXPECT TODAY Some feelings of bloating in the abdomen.  Passage of more gas than usual.  Spotting of blood in your stool or on the toilet paper.  IF YOU HAD POLYPS REMOVED DURING THE COLONOSCOPY: No aspirin  products for 7 days or as instructed.  No alcohol for 7 days or as instructed.  Eat a soft diet for the next 24 hours.  FINDING OUT THE RESULTS OF YOUR TEST Not all test results are  available during your visit. If your test results are not back during the visit, make an appointment with your caregiver to find out the results. Do not assume everything is normal if you have not heard from your caregiver or the medical facility. It is important for you to follow up on all of your test results.  SEEK IMMEDIATE MEDICAL ATTENTION IF: You have more than a spotting of  blood in your stool.  Your belly is swollen (abdominal distention).  You are nauseated or vomiting.  You have a temperature over 101.  You have abdominal pain or discomfort that is severe or gets worse throughout the day.   Your upper endoscopy revealed a normal-appearing esophagus.  Multiple small polyps in your stomach which are likely benign and nothing to worry about.  I did take samples however.  We will call with these results.  Small bowel appeared normal.  Your colonoscopy revealed 2 polyps which I removed successfully.  We will call with these results as well.  Given your age, I do not think you need further colonoscopies for surveillance purposes.  You also have diverticulosis and internal hemorrhoids. I would recommend increasing fiber in your diet or adding OTC Benefiber/Metamucil. Be sure to drink at least 4 to 6 glasses of water  daily. Follow-up with GI in 2-3 months   I hope you have a great rest of your week!  [name]. [name], D.O. Gastroenterology and Hepatology [facility]

## 2023-12-07 NOTE — Op Note (Signed)
 [facility] Patient Name: [name] Procedure Date: 12/07/2023 2:20 PM MRN: [id number] Date of Birth: December 13, 1935 Attending MD: Carlin POUR. Cindie , [place], [id number] CSN: [id number] Age: 88 Admit Type: Outpatient Procedure:                Upper GI endoscopy Indications:              Early satiety Providers:                Carlin POUR. Cindie, DO, Crystal Page, [place],                            Technician Referring MD:              Medicines:                See the Anesthesia note for documentation of the                            administered medications Complications:            No immediate complications. Estimated Blood Loss:     Estimated blood loss was minimal. Procedure:                Pre-Anesthesia Assessment:                           - The anesthesia plan was to use monitored                            anesthesia care (MAC).                           After obtaining informed consent, the endoscope was                            passed under direct vision. Throughout the                            procedure, the patient's blood pressure, pulse, and                            oxygen  saturations were monitored continuously. The                            [id number]-H190 ([id number]) scope was introduced through the                            mouth, and advanced to the second part of duodenum.                            The upper GI endoscopy was accomplished without                            difficulty. The patient tolerated the procedure                            well. Scope In: 2:41:01 PM Scope  Out: 2:44:31 PM Total Procedure Duration: 0 hours 3 minutes 30 seconds  Findings:      The Z-line was regular and was found 36 cm from the incisors.      Multiple small likely fundic gland polyps with no bleeding and no       stigmata of recent bleeding were found in the gastric body. Biopsies       were taken with a cold forceps of multiple polyps for histology.      The  duodenal bulb, first portion of the duodenum and second portion of       the duodenum were normal. Impression:               - Z-line regular, 36 cm from the incisors.                           - Multiple gastric polyps. Biopsied.                           - Normal duodenal bulb, first portion of the                            duodenum and second portion of the duodenum. Moderate Sedation:      Per Anesthesia Care Recommendation:           - Patient has a contact number available for                            emergencies. The signs and symptoms of potential                            delayed complications were discussed with the                            patient. Return to normal activities tomorrow.                            Written discharge instructions were provided to the                            patient.                           - Resume previous diet.                           - Continue present medications.                           - Await pathology results.                           - Return to GI clinic in 3 months. Procedure Code(s):        --- Professional ---                           [phone], Esophagogastroduodenoscopy, flexible,  transoral; with biopsy, single or multiple Diagnosis Code(s):        --- Professional ---                           K31.7, Polyp of stomach and duodenum                           R68.81, Early satiety CPT copyright 2022 American Medical Association. All rights reserved. The codes documented in this report are preliminary and upon coder review may  be revised to meet current compliance requirements. Carlin POUR. Cindie, DO Carlin POUR. Cindie, DO 12/07/2023 2:47:51 PM This report has been signed electronically. Number of Addenda: 0

## 2023-12-07 NOTE — Transfer of Care (Signed)
 Immediate Anesthesia Transfer of Care Note  Patient: [name]  Procedure(s) Performed: COLONOSCOPY EGD (ESOPHAGOGASTRODUODENOSCOPY)  Patient Location: Short Stay  Anesthesia Type:General  Level of Consciousness: drowsy  Airway & Oxygen  Therapy: Patient Spontanous Breathing  Post-op Assessment: Report given to RN and Post -op Vital signs reviewed and stable  Post vital signs: Reviewed and stable  Last Vitals:  Vitals Value Taken Time  BP 88/53   Temp 36.4   Pulse 60   Resp 14   SpO2 98     Last Pain:  Vitals:   12/07/23 1432  PainSc: 6          Complications: No notable events documented.

## 2023-12-08 ENCOUNTER — Encounter (HOSPITAL_COMMUNITY): Payer: Self-pay | Admitting: Internal Medicine

## 2023-12-08 ENCOUNTER — Telehealth: Payer: Self-pay | Admitting: Neurology

## 2023-12-08 NOTE — Telephone Encounter (Signed)
 Pt called to reschedule appt for tomorrow informed Pt  appt date the patient explained this is a yearly appy  the patient need to see Doctor. Pt will have to cancel appt tomorrow because Pt had surgery Yesterday and will not be able to make in office . Pt is requesting to be seen this year .

## 2023-12-08 NOTE — Telephone Encounter (Signed)
 New appointment card has been placed in the mail

## 2023-12-08 NOTE — Telephone Encounter (Signed)
 Call to patient, she reports she had EGD and colonoscopy yesterday and unable to come to appointment tomorrow. Offered multiple visits int he next weekend patient unable to make those times and declined VV visit. Schedule for next available and added to waitlist. She denies any seizures but does report confusion and says she thinks she is having TIA's/ Advised on ER if she feels she is having multiple TIAs. She says is her way of life and declined ER. She asked that we mail an appointment card.

## 2023-12-09 ENCOUNTER — Ambulatory Visit: Payer: Self-pay | Admitting: Internal Medicine

## 2023-12-09 ENCOUNTER — Ambulatory Visit: Payer: PPO | Admitting: Neurology

## 2023-12-09 LAB — SURGICAL PATHOLOGY

## 2023-12-10 DIAGNOSIS — E039 Hypothyroidism, unspecified: Secondary | ICD-10-CM | POA: Diagnosis not present

## 2023-12-10 DIAGNOSIS — E782 Mixed hyperlipidemia: Secondary | ICD-10-CM | POA: Diagnosis not present

## 2023-12-15 ENCOUNTER — Telehealth: Payer: Self-pay

## 2023-12-15 DIAGNOSIS — F331 Major depressive disorder, recurrent, moderate: Secondary | ICD-10-CM | POA: Diagnosis not present

## 2023-12-15 DIAGNOSIS — E039 Hypothyroidism, unspecified: Secondary | ICD-10-CM | POA: Diagnosis not present

## 2023-12-15 DIAGNOSIS — I495 Sick sinus syndrome: Secondary | ICD-10-CM | POA: Diagnosis not present

## 2023-12-15 DIAGNOSIS — K5909 Other constipation: Secondary | ICD-10-CM | POA: Diagnosis not present

## 2023-12-15 DIAGNOSIS — R413 Other amnesia: Secondary | ICD-10-CM

## 2023-12-15 DIAGNOSIS — I10 Essential (primary) hypertension: Secondary | ICD-10-CM | POA: Diagnosis not present

## 2023-12-15 DIAGNOSIS — J4530 Mild persistent asthma, uncomplicated: Secondary | ICD-10-CM | POA: Diagnosis not present

## 2023-12-15 DIAGNOSIS — F03B3 Unspecified dementia, moderate, with mood disturbance: Secondary | ICD-10-CM | POA: Diagnosis not present

## 2023-12-15 DIAGNOSIS — K219 Gastro-esophageal reflux disease without esophagitis: Secondary | ICD-10-CM | POA: Diagnosis not present

## 2023-12-15 DIAGNOSIS — Z8673 Personal history of transient ischemic attack (TIA), and cerebral infarction without residual deficits: Secondary | ICD-10-CM | POA: Diagnosis not present

## 2023-12-15 DIAGNOSIS — E870 Hyperosmolality and hypernatremia: Secondary | ICD-10-CM | POA: Diagnosis not present

## 2023-12-15 DIAGNOSIS — I480 Paroxysmal atrial fibrillation: Secondary | ICD-10-CM | POA: Diagnosis not present

## 2023-12-15 DIAGNOSIS — E782 Mixed hyperlipidemia: Secondary | ICD-10-CM | POA: Diagnosis not present

## 2023-12-15 NOTE — Progress Notes (Signed)
 Remote pacemaker transmission.

## 2023-12-16 ENCOUNTER — Encounter: Payer: Self-pay | Admitting: Internal Medicine

## 2023-12-16 DIAGNOSIS — G250 Essential tremor: Secondary | ICD-10-CM | POA: Diagnosis not present

## 2023-12-16 DIAGNOSIS — N130 Hydronephrosis with ureteropelvic junction obstruction: Secondary | ICD-10-CM | POA: Diagnosis not present

## 2023-12-16 DIAGNOSIS — F03B3 Unspecified dementia, moderate, with mood disturbance: Secondary | ICD-10-CM | POA: Diagnosis not present

## 2023-12-16 DIAGNOSIS — E039 Hypothyroidism, unspecified: Secondary | ICD-10-CM | POA: Diagnosis not present

## 2023-12-16 DIAGNOSIS — I10 Essential (primary) hypertension: Secondary | ICD-10-CM | POA: Diagnosis not present

## 2023-12-16 DIAGNOSIS — E782 Mixed hyperlipidemia: Secondary | ICD-10-CM | POA: Diagnosis not present

## 2023-12-16 DIAGNOSIS — Z8673 Personal history of transient ischemic attack (TIA), and cerebral infarction without residual deficits: Secondary | ICD-10-CM | POA: Diagnosis not present

## 2023-12-16 DIAGNOSIS — I480 Paroxysmal atrial fibrillation: Secondary | ICD-10-CM | POA: Diagnosis not present

## 2023-12-16 DIAGNOSIS — I130 Hypertensive heart and chronic kidney disease with heart failure and stage 1 through stage 4 chronic kidney disease, or unspecified chronic kidney disease: Secondary | ICD-10-CM | POA: Diagnosis not present

## 2023-12-16 DIAGNOSIS — F331 Major depressive disorder, recurrent, moderate: Secondary | ICD-10-CM | POA: Diagnosis not present

## 2023-12-16 DIAGNOSIS — E870 Hyperosmolality and hypernatremia: Secondary | ICD-10-CM | POA: Diagnosis not present

## 2023-12-16 DIAGNOSIS — N1831 Chronic kidney disease, stage 3a: Secondary | ICD-10-CM | POA: Diagnosis not present

## 2023-12-17 ENCOUNTER — Telehealth: Payer: Self-pay

## 2023-12-17 NOTE — Progress Notes (Signed)
 Complex Care Management Note  Care Guide Note 12/17/2023 Name: VELIA PAMER MRN: [id number] DOB: 01/08/1936  POETRY CERRO is a 87 y.o. year old female who sees [name], [name], MD for primary care. I reached out to [name] by phone today to offer complex care management services.  Ms. Ulloa was given information about Complex Care Management services today including:   The Complex Care Management services include support from the care team which includes your Nurse Care Manager, Clinical Social Worker, or Pharmacist.  The Complex Care Management team is here to help remove barriers to the health concerns and goals most important to you. Complex Care Management services are voluntary, and the patient may decline or stop services at any time by request to their care team member.   Complex Care Management Consent Status: Patient agreed to services and verbal consent obtained.   Follow up plan:  Telephone appointment with complex care management team member scheduled for:  12/21/23 @ 11 AM.   Encounter Outcome:  Patient Scheduled  Leotis Rase [facility] Guide  Direct Dial: [phone]  Fax [phone]

## 2023-12-21 ENCOUNTER — Other Ambulatory Visit: Payer: Self-pay

## 2023-12-21 NOTE — Patient Outreach (Addendum)
 Complex Care Management   Visit Note  12/21/2023  Name:  [name] MRN: [id number] DOB: [birth year]  Situation: Referral received for Complex Care Management related to Dementia I obtained verbal consent from Patient and POA.  Visit completed with Patient and POA on the phone.  Background:   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60   age 54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 2 weeks   Hemorrhoids 2007   History of kidney stones    HTN (hypertension)    Hyperplastic colon polyp 2005   Hypothyroidism    Narcolepsy    Pneumonia    PONV (postoperative nausea and vomiting)    Presence of permanent cardiac pacemaker    TIA (transient ischemic attack)     Assessment: Patient Reported Symptoms:  Cognitive Cognitive Status: Normal speech and language skills, Requires Assistance Decision Making Cognitive/Intellectual Conditions Management [RPT]: None reported or documented in medical history or problem list      Neurological Neurological Review of Symptoms: Not assessed    HEENT HEENT Symptoms Reported: Not assessed      Cardiovascular Cardiovascular Symptoms Reported: Not assessed    Respiratory Respiratory Symptoms Reported: Not assesed    Endocrine Endocrine Symptoms Reported: Not assessed    Gastrointestinal Gastrointestinal Symptoms Reported: Not assessed      Genitourinary Genitourinary Symptoms Reported: Not assessed    Integumentary Integumentary Symptoms Reported: Not assessed    Musculoskeletal Musculoskelatal Symptoms Reviewed: Not assessed        Psychosocial        Quality of Family Relationships: supportive Do you feel physically threatened by others?: No      07/23/2023    8:56 AM  Depression screen PHQ 2/9  Decreased Interest 0  Down, Depressed, Hopeless 0  PHQ - 2 Score 0    There were no vitals filed for this visit.  Medications Reviewed Today   Medications were not reviewed in this encounter     Recommendation:   Continue Current Plan of Care  Follow Up Plan:   Telephone follow-up 12/24/2023 at 11:30 am.  Olam Ally, MSW, LCSW [place]  [facility], [facility] Licensed Clinical Social Worker Direct Dial: [phone]

## 2023-12-21 NOTE — Patient Instructions (Signed)
 Visit Information  Thank you for taking time to visit with me today. Please don't hesitate to contact me if I can be of assistance to you before our next scheduled appointment.  Our next appointment is by telephone on 12/24/2023 at 11:30 am Please call the care guide team at [phone] if you need to cancel or reschedule your appointment.     Please call 911 if you are experiencing a Mental Health or Behavioral Health Crisis or need someone to talk to.  Patient verbalizes understanding of instructions and care plan provided today and agrees to view in MyChart. Active MyChart status and patient understanding of how to access instructions and care plan via MyChart confirmed with patient.     Olam Ally, MSW, LCSW [place]  [facility], [facility] Licensed Clinical Social Worker Direct Dial: [phone]

## 2023-12-24 ENCOUNTER — Other Ambulatory Visit: Payer: Self-pay

## 2023-12-24 DIAGNOSIS — F03B3 Unspecified dementia, moderate, with mood disturbance: Secondary | ICD-10-CM | POA: Diagnosis not present

## 2023-12-24 DIAGNOSIS — F331 Major depressive disorder, recurrent, moderate: Secondary | ICD-10-CM | POA: Diagnosis not present

## 2023-12-24 DIAGNOSIS — E782 Mixed hyperlipidemia: Secondary | ICD-10-CM | POA: Diagnosis not present

## 2023-12-24 DIAGNOSIS — I10 Essential (primary) hypertension: Secondary | ICD-10-CM | POA: Diagnosis not present

## 2023-12-24 NOTE — Patient Instructions (Signed)
 Visit Information  Thank you for taking time to visit with me today. Please don't hesitate to contact me if I can be of assistance to you before our next scheduled appointment.  Your next care management appointment is by telephone on 01/01/2024 at 11:30 am   Please call the care guide team at [phone] if you need to cancel, schedule, or reschedule an appointment.   Please call 911 if you are experiencing a Mental Health or Behavioral Health Crisis or need someone to talk to.  Olam Ally, MSW, LCSW [place]  [facility], [facility] Licensed Clinical Social Worker Direct Dial: [phone]

## 2023-12-24 NOTE — Patient Outreach (Signed)
 Complex Care Management   Visit Note  12/24/2023  Name:  [name] MRN: [id number] DOB: February 21, 1936  Situation:  Referral received for Complex Care Management related to Dementia I obtained verbal consent from Caregiver.  Visit completed with caregiver  on the phone. LCSW and caregiver discussed possible options for patient to age in place at home.  Background:   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36   age 30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 2 weeks   Hemorrhoids 2007   History of kidney stones    HTN (hypertension)    Hyperplastic colon polyp 2005   Hypothyroidism    Narcolepsy    Pneumonia    PONV (postoperative nausea and vomiting)    Presence of permanent cardiac pacemaker    TIA (transient ischemic attack)     Assessment: Patient Reported Symptoms:  Cognitive Cognitive Status: Normal speech and language skills, Requires Assistance Decision Making (memory recalling) Cognitive/Intellectual Conditions Management [RPT]: None reported or documented in medical history or problem list      Neurological Neurological Review of Symptoms: Not assessed    HEENT HEENT Symptoms Reported: Not assessed      Cardiovascular Cardiovascular Symptoms Reported: Not assessed    Respiratory Respiratory Symptoms Reported: Not assesed    Endocrine Endocrine Symptoms Reported: Not assessed    Gastrointestinal Gastrointestinal Symptoms Reported: Not assessed      Genitourinary Genitourinary Symptoms Reported: Not assessed    Integumentary Integumentary Symptoms Reported: Not assessed     Musculoskeletal Musculoskelatal Symptoms Reviewed: Not assessed        Psychosocial Psychosocial Symptoms Reported: Not assessed            07/23/2023    8:56 AM  Depression screen PHQ 2/9  Decreased Interest 0  Down, Depressed, Hopeless 0  PHQ - 2 Score 0    There were no vitals filed for this visit.  Medications Reviewed Today   Medications were not reviewed in this encounter     Recommendation:   Continue Current Plan of Care  Follow Up Plan:   Telephone follow-up 01/01/2024 at 11:30 am  Olam Ally, MSW, LCSW [place]  [facility], [facility] Licensed Clinical Social Worker Direct Dial: [phone]

## 2023-12-29 DIAGNOSIS — E870 Hyperosmolality and hypernatremia: Secondary | ICD-10-CM | POA: Diagnosis not present

## 2023-12-29 DIAGNOSIS — J4530 Mild persistent asthma, uncomplicated: Secondary | ICD-10-CM | POA: Diagnosis not present

## 2023-12-29 DIAGNOSIS — E782 Mixed hyperlipidemia: Secondary | ICD-10-CM | POA: Diagnosis not present

## 2023-12-29 DIAGNOSIS — I480 Paroxysmal atrial fibrillation: Secondary | ICD-10-CM | POA: Diagnosis not present

## 2023-12-29 DIAGNOSIS — I10 Essential (primary) hypertension: Secondary | ICD-10-CM | POA: Diagnosis not present

## 2023-12-29 DIAGNOSIS — E039 Hypothyroidism, unspecified: Secondary | ICD-10-CM | POA: Diagnosis not present

## 2023-12-29 DIAGNOSIS — Z8673 Personal history of transient ischemic attack (TIA), and cerebral infarction without residual deficits: Secondary | ICD-10-CM | POA: Diagnosis not present

## 2023-12-29 DIAGNOSIS — K5909 Other constipation: Secondary | ICD-10-CM | POA: Diagnosis not present

## 2023-12-29 DIAGNOSIS — F03B3 Unspecified dementia, moderate, with mood disturbance: Secondary | ICD-10-CM | POA: Diagnosis not present

## 2023-12-29 DIAGNOSIS — F331 Major depressive disorder, recurrent, moderate: Secondary | ICD-10-CM | POA: Diagnosis not present

## 2023-12-29 DIAGNOSIS — I495 Sick sinus syndrome: Secondary | ICD-10-CM | POA: Diagnosis not present

## 2023-12-29 DIAGNOSIS — K219 Gastro-esophageal reflux disease without esophagitis: Secondary | ICD-10-CM | POA: Diagnosis not present

## 2023-12-31 ENCOUNTER — Telehealth

## 2023-12-31 ENCOUNTER — Other Ambulatory Visit: Payer: Self-pay

## 2023-12-31 NOTE — Patient Outreach (Signed)
 Complex Care Management   Visit Note  12/31/2023  Name:  [name] MRN: [id number] DOB: 1935/09/05  Situation: Referral received for Complex Care Management related to Dementia I obtained verbal consent from patient.  Visit completed with caregiver  on the phone. LCSW and patient completed all of patient's assessment. LCSW asked patient to think about which resources she thought she may need.    Background:   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65   age 84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 2 weeks   Hemorrhoids 2007   History of kidney stones    HTN (hypertension)    Hyperplastic colon polyp 2005   Hypothyroidism    Narcolepsy    Pneumonia    PONV (postoperative nausea and vomiting)    Presence of permanent cardiac pacemaker    TIA (transient ischemic attack)     Assessment: Patient Reported Symptoms:  Cognitive Cognitive Status: Normal speech and language skills, Requires Assistance Decision Making, Other: (dementia) Cognitive/Intellectual Conditions Management [RPT]: None reported or documented in medical history or problem list      Neurological Neurological Review of Symptoms: No symptoms reported    HEENT HEENT Symptoms Reported: No symptoms reported, Other: (history of hearing loss) HEENT Management Strategies: Medical device (Patient reports wearing a hearing aide in both ears)    Cardiovascular Cardiovascular Symptoms Reported: Other: Other Cardiovascular Symptoms: Patient reports having a pace maker Does patient have uncontrolled Hypertension?: No    Respiratory Respiratory  Symptoms Reported: Shortness of breath Other Respiratory Symptoms: Patient reports having asthma Additional Respiratory Details: Patient reports using inhalers Respiratory Management Strategies: Medication therapy  Endocrine Endocrine Symptoms Reported: No symptoms reported Is patient diabetic?: No    Gastrointestinal Gastrointestinal Symptoms Reported: No symptoms reported      Genitourinary Genitourinary Symptoms Reported: No symptoms reported    Integumentary Integumentary Symptoms Reported: No symptoms reported    Musculoskeletal Musculoskelatal Symptoms Reviewed: Difficulty walking Additional Musculoskeletal Details: Patient reports using a cane   Falls in the past year?: No Number of falls in past year: 1 or less Was there an injury with Fall?: No Fall Risk Category Calculator: 0 Patient Fall Risk Level: Low Fall Risk Patient at Risk for Falls Due to: No Fall Risks  Psychosocial Psychosocial Symptoms Reported: No symptoms reported Additional Psychological Details: Patient reports depression in the past however screened negative today     Quality of Family Relationships: supportive Do you feel physically threatened by others?: No      12/31/2023    2:33 PM  Depression screen PHQ 2/9  Decreased Interest 0  Down, Depressed, Hopeless 0  PHQ - 2 Score 0    There were no vitals filed for this visit.  Medications Reviewed Today   Medications were not reviewed in this encounter     Recommendation:   Continue Current Plan of Care  Follow Up Plan:   Telephone follow-up 01/07/2024 at 11:00 am  Olam Ally, MSW, LCSW [facility]  [facility], [facility] Licensed Clinical Social Worker Direct Dial: [phone]

## 2023-12-31 NOTE — Patient Instructions (Signed)
 Visit Information  Thank you for taking time to visit with me today. Please don't hesitate to contact me if I can be of assistance to you before our next scheduled appointment.  Your next care management appointment is by telephone on 01/07/2024 at 1100 am   Please call the care guide team at [phone] if you need to cancel, schedule, or reschedule an appointment.   Please call 911 if you are experiencing a Mental Health or Behavioral Health Crisis or need someone to talk to.  Olam Ally, MSW, LCSW [place]  [facility], [facility] Licensed Clinical Social Worker Direct Dial: [phone]

## 2024-01-01 ENCOUNTER — Other Ambulatory Visit: Payer: Self-pay

## 2024-01-01 NOTE — Patient Instructions (Signed)
 Visit Information  Thank you for taking time to visit with me today. Please don't hesitate to contact me if I can be of assistance to you before our next scheduled appointment.  Your next care management appointment is by telephone on 01/07/2024 at 1100 am   Please call the care guide team at [phone] if you need to cancel, schedule, or reschedule an appointment.   Please call 911 if you are experiencing a Mental Health or Behavioral Health Crisis or need someone to talk to.  Olam Ally, MSW, LCSW [place]  [facility], [facility] Licensed Clinical Social Worker Direct Dial: [phone]

## 2024-01-01 NOTE — Patient Outreach (Signed)
 Complex Care Management   Visit Note  01/01/2024  Name:  [name] MRN: [id number] DOB: 26-Jan-1936  Situation: Referral received for Complex Care Management related to Dementia I obtained verbal consent from patient.  Visit completed with caregiver  on the phone. LCSW and patient's brother discussed the resources for the personal care assistant.  Background:   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36   age 88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 2 weeks   Hemorrhoids 2007   History of kidney stones    HTN (hypertension)    Hyperplastic colon polyp 2005   Hypothyroidism    Narcolepsy    Pneumonia    PONV (postoperative nausea and vomiting)    Presence of permanent cardiac pacemaker    TIA (transient ischemic attack)     Assessment: Patient Reported Symptoms:  Cognitive Cognitive Status: Normal speech and language skills, Requires Assistance Decision Making Cognitive/Intellectual Conditions Management [RPT]: None reported or documented in medical history or problem list      Neurological Neurological Review of Symptoms: Not assessed    HEENT HEENT Symptoms Reported: Not assessed      Cardiovascular Cardiovascular Symptoms Reported: Not assessed    Respiratory Respiratory Symptoms Reported: Not assesed    Endocrine Endocrine Symptoms Reported: Not assessed    Gastrointestinal Gastrointestinal Symptoms Reported: Not assessed      Genitourinary Genitourinary Symptoms Reported: Not assessed    Integumentary Integumentary Symptoms Reported: Not assessed    Musculoskeletal  Musculoskelatal Symptoms Reviewed: Not assessed        Psychosocial Psychosocial Symptoms Reported: Not assessed            12/31/2023    2:33 PM  Depression screen PHQ 2/9  Decreased Interest 0  Down, Depressed, Hopeless 0  PHQ - 2 Score 0    There were no vitals filed for this visit.  Medications Reviewed Today   Medications were not reviewed in this encounter     Recommendation:   Continue Current Plan of Care  Follow Up Plan:   Telephone follow-up 01/07/2024 at 11:00 am  Olam Ally, MSW, LCSW [place]  [facility], [facility] Licensed Clinical Social Worker Direct Dial: [phone]

## 2024-01-07 ENCOUNTER — Telehealth: Payer: Self-pay

## 2024-01-07 NOTE — Patient Outreach (Addendum)
 LCSW called patient's insurance [employer] [phone] to ascertain patient's benefits. LCSW was informed that patient has the [employer] benefit for up to 30 hours a year. LCSW called [employer] to get enrollment process. LCSW will relay this information to patient/family.  Olam Ally, MSW, LCSW [place]  [facility], [facility] Health Licensed Clinical Social Worker Direct Dial: [phone]

## 2024-01-14 ENCOUNTER — Other Ambulatory Visit: Payer: Self-pay

## 2024-01-14 NOTE — Patient Outreach (Signed)
 Complex Care Management   Visit Note  01/14/2024  Name:  [name] MRN: [id number] DOB: August 27, 1935  Situation: Referral received for Complex Care Management related to Dementia I obtained verbal consent from patient.  Visit completed with  patient and caregiver  on the phone. LCSW discussed the [employer] Dun benefits with patient and patient's caregiver . LCSW email the resources for them to review and discuss. LCSW will follow up with them in 2 weeks to ascertain if they want to executed the benefit. Background:   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70   age 72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 2 weeks   Hemorrhoids 2007   History of kidney stones    HTN (hypertension)    Hyperplastic colon polyp 2005   Hypothyroidism    Narcolepsy    Pneumonia    PONV (postoperative nausea and vomiting)    Presence of permanent cardiac pacemaker    TIA (transient ischemic attack)     Assessment: Patient Reported Symptoms:  Cognitive Cognitive Status: Normal speech and language skills, Requires Assistance Decision Making Cognitive/Intellectual Conditions Management [RPT]: None reported or documented in medical history or problem list      Neurological Neurological Review of Symptoms: Not assessed    HEENT HEENT Symptoms Reported: Not assessed      Cardiovascular Cardiovascular Symptoms Reported: Not assessed    Respiratory Respiratory Symptoms Reported: Not assesed    Endocrine Endocrine Symptoms Reported: Not assessed    Gastrointestinal Gastrointestinal Symptoms Reported: Not assessed       Genitourinary Genitourinary Symptoms Reported: Not assessed    Integumentary Integumentary Symptoms Reported: Not assessed    Musculoskeletal Musculoskelatal Symptoms Reviewed: Not assessed        Psychosocial Psychosocial Symptoms Reported: No symptoms reported Additional Psychological Details: Patient report no symptom          01/14/2024    PHQ2-9 Depression Screening   Little interest or pleasure in doing things    Feeling down, depressed, or hopeless    PHQ-2 - Total Score    Trouble falling or staying asleep, or sleeping too much    Feeling tired or having little energy    Poor appetite or overeating     Feeling bad about yourself - or that you are a failure or have let yourself or your family down    Trouble concentrating on things, such as reading the newspaper or watching television    Moving or speaking so slowly that other people could have noticed.  Or the opposite - being so fidgety or restless that you have been moving around a lot more than usual    Thoughts that you would be better off dead, or hurting yourself in some way    PHQ2-9 Total Score    If you checked off any problems, how difficult have these problems made it for you to do your work, take care of things at home, or get along with other people  Depression Interventions/Treatment      There were no vitals filed for this visit.  Medications Reviewed Today     Reviewed by Sherren Olam FORBES KEN (Social Worker) on 01/14/24 at 1142  Med List Status: <None>   Medication Order Taking? Sig Documenting Provider Last Dose Status Informant  acetaminophen  (TYLENOL ) 650 MG CR tablet [id number]  Take 650 mg by mouth every 8 (eight) hours as needed for pain. [provider]  Active Self  albuterol  (VENTOLIN  HFA) 108 (90 Base) MCG/ACT inhaler [id number] Yes Inhale 2 puffs into the lungs every 6 (six) hours as needed for wheezing or shortness of breath. [name], NP  Active Self  apixaban  (ELIQUIS )  2.5 MG TABS tablet [id number]  Take 1 tablet (2.5 mg total) by mouth 2 (two) times daily. [name], NP  Active Self           Med Note [facility], [name]   Wed Jul 08, 2023 10:21 AM) [name] with pharmacy removed Eliquis  from pack (prefilled at [facility]) for 2/10& 2/11. Per [name], she did not have instructions for today to hold for 2/12 spoke to [place]. Patient likely had Eliquis  today 2/12 as Pharmacy [facility]) states they did not have instructions to remove from prefilled pack.   ezetimibe  (ZETIA ) 10 MG tablet [id number] Yes Take 1 tablet (10 mg total) by mouth daily. [name], NP  Active Self  furosemide  (LASIX ) 20 MG tablet [id number] Yes Take 3 tablets (60 mg total) by mouth daily. [name], NP  Active Self  levothyroxine  (SYNTHROID ) 50 MCG tablet [id number] Yes Take 50 mcg by mouth daily before breakfast. [provider]  Active Self  memantine  (NAMENDA ) 10 MG tablet [id number] Yes Take 1 tablet (10 mg total) by mouth 2 (two) times daily. [name], NP  Active Self  metoprolol  tartrate (LOPRESSOR ) 50 MG tablet [id number] Yes Take 1 tablet (50 mg total) by mouth 2 (two) times daily. Waddell Danelle ORN, MD  Active   oxybutynin  (DITROPAN ) 5 MG tablet [id number] Yes Take 1 tablet (5 mg total) by mouth every 8 (eight) hours as needed for bladder spasms. Devere Lonni Righter, MD  Active   pantoprazole  (PROTONIX ) 40 MG tablet [id number] Yes Take 1 tablet (40 mg total) by mouth daily. [name], NP  Active Self  phenazopyridine  (PYRIDIUM ) 200 MG tablet [id number] Yes Take 1 tablet (200 mg total) by mouth 3 (three) times daily as needed (for pain with urination). Devere Lonni Righter, MD  Active   Potassium Chloride  ER 20 MEQ TBCR [id number] Yes Take 1 tablet (20 mEq total) by mouth daily. 1 tab daily by mouth--- take while taking Lasix /furosemide  [name], NP  Active Self            Recommendation:   Continue Current Plan of Care  Follow Up  Plan:   Telephone follow-up 01/28/2024 at 2:30 PM  Olam Sherren, MSW, LCSW [place]  [facility], [facility] Health Licensed Clinical Social Worker Direct Dial: [phone]

## 2024-01-14 NOTE — Patient Instructions (Signed)
 Visit Information  Thank you for taking time to visit with me today. Please don't hesitate to contact me if I can be of assistance to you before our next scheduled appointment.  Your next care management appointment is by telephone on 01/28/2024 at 2:30 pm   Please call the care guide team at [phone] if you need to cancel, schedule, or reschedule an appointment.   Please call 911 if you are experiencing a Mental Health or Behavioral Health Crisis or need someone to talk to.  Olam Ally, MSW, LCSW [place]  [facility], [facility] Licensed Clinical Social Worker Direct Dial: [phone]

## 2024-01-20 DIAGNOSIS — K219 Gastro-esophageal reflux disease without esophagitis: Secondary | ICD-10-CM | POA: Diagnosis not present

## 2024-01-20 DIAGNOSIS — E039 Hypothyroidism, unspecified: Secondary | ICD-10-CM | POA: Diagnosis not present

## 2024-01-20 DIAGNOSIS — N130 Hydronephrosis with ureteropelvic junction obstruction: Secondary | ICD-10-CM | POA: Diagnosis not present

## 2024-01-20 DIAGNOSIS — M5441 Lumbago with sciatica, right side: Secondary | ICD-10-CM | POA: Diagnosis not present

## 2024-01-20 DIAGNOSIS — I130 Hypertensive heart and chronic kidney disease with heart failure and stage 1 through stage 4 chronic kidney disease, or unspecified chronic kidney disease: Secondary | ICD-10-CM | POA: Diagnosis not present

## 2024-01-20 DIAGNOSIS — G8929 Other chronic pain: Secondary | ICD-10-CM | POA: Diagnosis not present

## 2024-01-20 DIAGNOSIS — J4540 Moderate persistent asthma, uncomplicated: Secondary | ICD-10-CM | POA: Diagnosis not present

## 2024-01-20 DIAGNOSIS — G250 Essential tremor: Secondary | ICD-10-CM | POA: Diagnosis not present

## 2024-01-20 DIAGNOSIS — Z7901 Long term (current) use of anticoagulants: Secondary | ICD-10-CM | POA: Diagnosis not present

## 2024-01-20 DIAGNOSIS — E782 Mixed hyperlipidemia: Secondary | ICD-10-CM | POA: Diagnosis not present

## 2024-01-20 DIAGNOSIS — J4489 Other specified chronic obstructive pulmonary disease: Secondary | ICD-10-CM | POA: Diagnosis not present

## 2024-01-20 DIAGNOSIS — M810 Age-related osteoporosis without current pathological fracture: Secondary | ICD-10-CM | POA: Diagnosis not present

## 2024-01-20 DIAGNOSIS — K5900 Constipation, unspecified: Secondary | ICD-10-CM | POA: Diagnosis not present

## 2024-01-20 DIAGNOSIS — I5032 Chronic diastolic (congestive) heart failure: Secondary | ICD-10-CM | POA: Diagnosis not present

## 2024-01-20 DIAGNOSIS — D631 Anemia in chronic kidney disease: Secondary | ICD-10-CM | POA: Diagnosis not present

## 2024-01-20 DIAGNOSIS — F02B3 Dementia in other diseases classified elsewhere, moderate, with mood disturbance: Secondary | ICD-10-CM | POA: Diagnosis not present

## 2024-01-20 DIAGNOSIS — L821 Other seborrheic keratosis: Secondary | ICD-10-CM | POA: Diagnosis not present

## 2024-01-20 DIAGNOSIS — Z6828 Body mass index (BMI) 28.0-28.9, adult: Secondary | ICD-10-CM | POA: Diagnosis not present

## 2024-01-20 DIAGNOSIS — I700 Atherosclerosis of aorta: Secondary | ICD-10-CM | POA: Diagnosis not present

## 2024-01-20 DIAGNOSIS — I480 Paroxysmal atrial fibrillation: Secondary | ICD-10-CM | POA: Diagnosis not present

## 2024-01-20 DIAGNOSIS — E663 Overweight: Secondary | ICD-10-CM | POA: Diagnosis not present

## 2024-01-20 DIAGNOSIS — F331 Major depressive disorder, recurrent, moderate: Secondary | ICD-10-CM | POA: Diagnosis not present

## 2024-01-20 DIAGNOSIS — N1831 Chronic kidney disease, stage 3a: Secondary | ICD-10-CM | POA: Diagnosis not present

## 2024-01-21 ENCOUNTER — Encounter: Payer: PPO | Attending: Internal Medicine | Admitting: *Deleted

## 2024-01-21 VITALS — BP 113/66 | HR 60 | Temp 97.4°F | Resp 16

## 2024-01-21 DIAGNOSIS — M810 Age-related osteoporosis without current pathological fracture: Secondary | ICD-10-CM

## 2024-01-21 DIAGNOSIS — F03B3 Unspecified dementia, moderate, with mood disturbance: Secondary | ICD-10-CM | POA: Diagnosis not present

## 2024-01-21 DIAGNOSIS — I10 Essential (primary) hypertension: Secondary | ICD-10-CM | POA: Diagnosis not present

## 2024-01-21 DIAGNOSIS — I5032 Chronic diastolic (congestive) heart failure: Secondary | ICD-10-CM | POA: Diagnosis not present

## 2024-01-21 DIAGNOSIS — E782 Mixed hyperlipidemia: Secondary | ICD-10-CM | POA: Diagnosis not present

## 2024-01-21 MED ORDER — DENOSUMAB 60 MG/ML ~~LOC~~ SOSY
60.0000 mg | PREFILLED_SYRINGE | Freq: Once | SUBCUTANEOUS | Status: AC
Start: 2024-01-21 — End: 2024-01-21
  Administered 2024-01-21: 60 mg via SUBCUTANEOUS

## 2024-01-21 NOTE — Progress Notes (Signed)
 Diagnosis: Osteoporosis  Provider:  Dwana Melena MD  Procedure: Injection  Prolia (Denosumab), Dose: 60 mg, Site: subcutaneous, Number of injections: 1  Injection Site(s): Right arm  Post Care: Observation period completed  Discharge: Condition: Good, Destination: Home . AVS Provided  Performed by:  Daleen Squibb, RN

## 2024-01-22 DIAGNOSIS — I10 Essential (primary) hypertension: Secondary | ICD-10-CM | POA: Diagnosis not present

## 2024-01-22 DIAGNOSIS — I495 Sick sinus syndrome: Secondary | ICD-10-CM | POA: Diagnosis not present

## 2024-01-22 DIAGNOSIS — K219 Gastro-esophageal reflux disease without esophagitis: Secondary | ICD-10-CM | POA: Diagnosis not present

## 2024-01-22 DIAGNOSIS — E870 Hyperosmolality and hypernatremia: Secondary | ICD-10-CM | POA: Diagnosis not present

## 2024-01-22 DIAGNOSIS — K5909 Other constipation: Secondary | ICD-10-CM | POA: Diagnosis not present

## 2024-01-22 DIAGNOSIS — I480 Paroxysmal atrial fibrillation: Secondary | ICD-10-CM | POA: Diagnosis not present

## 2024-01-22 DIAGNOSIS — F331 Major depressive disorder, recurrent, moderate: Secondary | ICD-10-CM | POA: Diagnosis not present

## 2024-01-22 DIAGNOSIS — Z8673 Personal history of transient ischemic attack (TIA), and cerebral infarction without residual deficits: Secondary | ICD-10-CM | POA: Diagnosis not present

## 2024-01-22 DIAGNOSIS — E039 Hypothyroidism, unspecified: Secondary | ICD-10-CM | POA: Diagnosis not present

## 2024-01-22 DIAGNOSIS — E782 Mixed hyperlipidemia: Secondary | ICD-10-CM | POA: Diagnosis not present

## 2024-01-22 DIAGNOSIS — F03B3 Unspecified dementia, moderate, with mood disturbance: Secondary | ICD-10-CM | POA: Diagnosis not present

## 2024-01-22 DIAGNOSIS — J4530 Mild persistent asthma, uncomplicated: Secondary | ICD-10-CM | POA: Diagnosis not present

## 2024-01-28 ENCOUNTER — Telehealth: Payer: Self-pay

## 2024-01-29 ENCOUNTER — Ambulatory Visit (INDEPENDENT_AMBULATORY_CARE_PROVIDER_SITE_OTHER): Payer: PPO

## 2024-01-29 DIAGNOSIS — I495 Sick sinus syndrome: Secondary | ICD-10-CM | POA: Diagnosis not present

## 2024-01-30 LAB — CUP PACEART REMOTE DEVICE CHECK
Battery Remaining Longevity: 158 mo
Battery Voltage: 3.1 V
Brady Statistic AP VP Percent: 0.03 %
Brady Statistic AP VS Percent: 69.16 %
Brady Statistic AS VP Percent: 0.01 %
Brady Statistic AS VS Percent: 30.8 %
Brady Statistic RA Percent Paced: 68.97 %
Brady Statistic RV Percent Paced: 0.04 %
Date Time Interrogation Session: 20250905040930
Implantable Lead Connection Status: 753985
Implantable Lead Connection Status: 753985
Implantable Lead Implant Date: 20240904
Implantable Lead Implant Date: 20240904
Implantable Lead Location: 753859
Implantable Lead Location: 753860
Implantable Lead Model: 3830
Implantable Lead Model: 5076
Implantable Pulse Generator Implant Date: 20240904
Lead Channel Impedance Value: 304 Ohm
Lead Channel Impedance Value: 323 Ohm
Lead Channel Impedance Value: 361 Ohm
Lead Channel Impedance Value: 494 Ohm
Lead Channel Pacing Threshold Amplitude: 0.5 V
Lead Channel Pacing Threshold Amplitude: 1.25 V
Lead Channel Pacing Threshold Pulse Width: 0.4 ms
Lead Channel Pacing Threshold Pulse Width: 0.4 ms
Lead Channel Sensing Intrinsic Amplitude: 2 mV
Lead Channel Sensing Intrinsic Amplitude: 2 mV
Lead Channel Sensing Intrinsic Amplitude: 25.375 mV
Lead Channel Sensing Intrinsic Amplitude: 25.375 mV
Lead Channel Setting Pacing Amplitude: 1.5 V
Lead Channel Setting Pacing Amplitude: 2.5 V
Lead Channel Setting Pacing Pulse Width: 0.4 ms
Lead Channel Setting Sensing Sensitivity: 1.2 mV
Zone Setting Status: 755011

## 2024-01-31 ENCOUNTER — Ambulatory Visit: Payer: Self-pay | Admitting: Internal Medicine

## 2024-02-02 DIAGNOSIS — E870 Hyperosmolality and hypernatremia: Secondary | ICD-10-CM | POA: Diagnosis not present

## 2024-02-02 DIAGNOSIS — Z8673 Personal history of transient ischemic attack (TIA), and cerebral infarction without residual deficits: Secondary | ICD-10-CM | POA: Diagnosis not present

## 2024-02-02 DIAGNOSIS — F03B3 Unspecified dementia, moderate, with mood disturbance: Secondary | ICD-10-CM | POA: Diagnosis not present

## 2024-02-02 DIAGNOSIS — F331 Major depressive disorder, recurrent, moderate: Secondary | ICD-10-CM | POA: Diagnosis not present

## 2024-02-02 DIAGNOSIS — E039 Hypothyroidism, unspecified: Secondary | ICD-10-CM | POA: Diagnosis not present

## 2024-02-02 DIAGNOSIS — K5909 Other constipation: Secondary | ICD-10-CM | POA: Diagnosis not present

## 2024-02-02 DIAGNOSIS — I10 Essential (primary) hypertension: Secondary | ICD-10-CM | POA: Diagnosis not present

## 2024-02-02 DIAGNOSIS — K219 Gastro-esophageal reflux disease without esophagitis: Secondary | ICD-10-CM | POA: Diagnosis not present

## 2024-02-02 DIAGNOSIS — I495 Sick sinus syndrome: Secondary | ICD-10-CM | POA: Diagnosis not present

## 2024-02-02 DIAGNOSIS — I480 Paroxysmal atrial fibrillation: Secondary | ICD-10-CM | POA: Diagnosis not present

## 2024-02-02 DIAGNOSIS — J4530 Mild persistent asthma, uncomplicated: Secondary | ICD-10-CM | POA: Diagnosis not present

## 2024-02-02 DIAGNOSIS — E782 Mixed hyperlipidemia: Secondary | ICD-10-CM | POA: Diagnosis not present

## 2024-02-11 ENCOUNTER — Other Ambulatory Visit: Payer: Self-pay

## 2024-02-11 NOTE — Patient Instructions (Signed)
 Visit Information  Thank you for taking time to visit with me today. Please don't hesitate to contact me if I can be of assistance to you before our next scheduled appointment.  Your next care management appointment is by telephone on 03/02/2024 at 11:00 am   Please call the care guide team at [phone] if you need to cancel, schedule, or reschedule an appointment.   Please call 911 if you are experiencing a Mental Health or Behavioral Health Crisis or need someone to talk to.  Olam Ally, MSW, LCSW [place]  [facility], [facility] Licensed Clinical Social Worker Direct Dial: [phone]

## 2024-02-11 NOTE — Progress Notes (Signed)
 Remote PPM Transmission

## 2024-02-11 NOTE — Patient Outreach (Signed)
 Complex Care Management   Visit Note  02/11/2024  Name:  [name] MRN: [id number] DOB: 10-02-1935  Situation: Referral received for Complex Care Management related to Dementia I obtained verbal consent from Patient.  Visit completed with Patient on the phone. LCSW inquired with patient if she wanted to executed the [employer] Dun benefit through her insurance. Patient requested the information be sent to her as well and a follow up visit has been scheduled. Background:   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49   age 8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 2 weeks   Hemorrhoids 2007   History of kidney stones    HTN (hypertension)    Hyperplastic colon polyp 2005   Hypothyroidism    Narcolepsy    Pneumonia    PONV (postoperative nausea and vomiting)    Presence of permanent cardiac pacemaker    TIA (transient ischemic attack)     Assessment: Patient Reported Symptoms:  Cognitive Cognitive Status: Normal speech and language skills, Requires Assistance Decision Making Cognitive/Intellectual Conditions Management [RPT]: None reported or documented in medical history or problem list      Neurological Neurological Review of Symptoms: No symptoms reported    HEENT HEENT Symptoms Reported: Other: (Patient reports wearing hearing aides) HEENT Management Strategies: Medical device HEENT Comment: 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    Cardiovascular Cardiovascular Symptoms Reported: No symptoms reported Other Cardiovascular Symptoms: Patient reports having a pace maker Does patient have  uncontrolled Hypertension?: No Cardiovascular Management Strategies: Adequate rest  Respiratory Other Respiratory Symptoms: Patient reports having asthma Additional Respiratory Details: Patient reports using inhalers Respiratory Management Strategies: Medication therapy  Endocrine Endocrine Symptoms Reported: No symptoms reported Is patient diabetic?: No    Gastrointestinal Gastrointestinal Symptoms Reported: No symptoms reported      Genitourinary Genitourinary Symptoms Reported: No symptoms reported    Integumentary Integumentary Symptoms Reported: No symptoms reported    Musculoskeletal Musculoskelatal Symptoms Reviewed: Joint pain, Difficulty walking Additional Musculoskeletal Details: Patient reports using cane   Falls in the past year?: No Number of falls in past year: 1 or less Was there an injury with Fall?: No Fall Risk Category Calculator: 0 Patient Fall Risk Level: Low Fall Risk Patient at Risk for Falls Due to: No Fall Risks Fall risk Follow up: Falls evaluation completed  Psychosocial Psychosocial Symptoms Reported: No symptoms reported Additional Psychological Details: Patient reports no symptom          02/11/2024    PHQ2-9 Depression Screening   Little interest or pleasure in doing things    Feeling down, depressed, or hopeless    PHQ-2 - Total Score    Trouble falling or staying asleep, or sleeping too much    Feeling tired or having little energy    Poor appetite or overeating     Feeling bad about yourself - or that you are a failure or have let yourself or your family down    Trouble concentrating  on things, such as reading the newspaper or watching television    Moving or speaking so slowly that other people could have noticed.  Or the opposite - being so fidgety or restless that you have been moving around a lot more than usual    Thoughts that you would be better off dead, or hurting yourself in some way    PHQ2-9 Total Score    If you checked off  any problems, how difficult have these problems made it for you to do your work, take care of things at home, or get along with other people    Depression Interventions/Treatment      There were no vitals filed for this visit.  Medications Reviewed Today     Reviewed by Sherren Olam FORBES KEN (Social Worker) on 02/11/24 at 1036  Med List Status: <None>   Medication Order Taking? Sig Documenting Provider Last Dose Status Informant  acetaminophen  (TYLENOL ) 650 MG CR tablet [id number]  Take 650 mg by mouth every 8 (eight) hours as needed for pain. [provider]  Active Self  albuterol  (VENTOLIN  HFA) 108 (90 Base) MCG/ACT inhaler [id number]  Inhale 2 puffs into the lungs every 6 (six) hours as needed for wheezing or shortness of breath. [name], NP  Active Self  apixaban  (ELIQUIS ) 2.5 MG TABS tablet [id number]  Take 1 tablet (2.5 mg total) by mouth 2 (two) times daily. [name], NP  Active Self           Med Note [facility], [name]   Wed Jul 08, 2023 10:21 AM) [name] with pharmacy removed Eliquis  from pack (prefilled at [facility]) for 2/10& 2/11. Per [name], she did not have instructions for today to hold for 2/12 spoke to [place]. Patient likely had Eliquis  today 2/12 as Pharmacy [facility]) states they did not have instructions to remove from prefilled pack.   ezetimibe  (ZETIA ) 10 MG tablet [id number]  Take 1 tablet (10 mg total) by mouth daily. [name], NP  Active Self  furosemide  (LASIX ) 20 MG tablet [id number]  Take 3 tablets (60 mg total) by mouth daily. [name], NP  Active Self  levothyroxine  (SYNTHROID ) 50 MCG tablet [id number]  Take 50 mcg by mouth daily before breakfast. [provider]  Active Self  memantine  (NAMENDA ) 10 MG tablet [id number]  Take 1 tablet (10 mg total) by mouth 2 (two) times daily. [name], NP  Active Self  metoprolol  tartrate (LOPRESSOR ) 50 MG tablet [id number]  Take 1 tablet (50 mg total) by mouth 2 (two) times  daily. Waddell Danelle ORN, MD  Active   oxybutynin  (DITROPAN ) 5 MG tablet [id number]  Take 1 tablet (5 mg total) by mouth every 8 (eight) hours as needed for bladder spasms. Devere Lonni Righter, MD  Active   pantoprazole  (PROTONIX ) 40 MG tablet [id number]  Take 1 tablet (40 mg total) by mouth daily. [name], NP  Active Self  phenazopyridine  (PYRIDIUM ) 200 MG tablet [id number]  Take 1 tablet (200 mg total) by mouth 3 (three) times daily as needed (for pain with urination). Devere Lonni Righter, MD  Active   Potassium Chloride  ER 20 MEQ TBCR [id number]  Take 1 tablet (20 mEq total) by mouth daily. 1 tab daily by mouth--- take while taking Lasix [place] Landy, Barnie RAMAN, NP  Active Self            Recommendation:   Continue Current Plan of Care  Follow Up Plan:   Telephone follow-up  03/02/2024 at 11:00 am  Olam Ally, MSW, LCSW [place]  [facility], [facility] Health Licensed Clinical Social Worker Direct Dial: [phone]

## 2024-02-15 DIAGNOSIS — I10 Essential (primary) hypertension: Secondary | ICD-10-CM | POA: Diagnosis not present

## 2024-02-16 ENCOUNTER — Ambulatory Visit: Admitting: Gastroenterology

## 2024-02-16 ENCOUNTER — Telehealth (INDEPENDENT_AMBULATORY_CARE_PROVIDER_SITE_OTHER): Payer: Self-pay

## 2024-02-16 ENCOUNTER — Encounter: Payer: Self-pay | Admitting: Gastroenterology

## 2024-02-16 VITALS — BP 102/67 | HR 59 | Temp 98.2°F | Ht 60.0 in | Wt 134.5 lb

## 2024-02-16 DIAGNOSIS — R109 Unspecified abdominal pain: Secondary | ICD-10-CM

## 2024-02-16 DIAGNOSIS — R140 Abdominal distension (gaseous): Secondary | ICD-10-CM

## 2024-02-16 DIAGNOSIS — R1084 Generalized abdominal pain: Secondary | ICD-10-CM

## 2024-02-16 NOTE — Patient Instructions (Signed)
 I recommend starting Benefiber daily. This can help with your stools being more productive and formed together.   I have ordered a special CT scan.  We will see you back in 3 months!  I enjoyed seeing you again today! I value our relationship and want to provide genuine, compassionate, and quality care. You may receive a survey regarding your visit with me, and I welcome your feedback! Thanks so much for taking the time to complete this. I look forward to seeing you again.      Therisa MICAEL Stager, PhD, ANP-BC [facility]

## 2024-02-16 NOTE — Telephone Encounter (Signed)
 ATC pt for inform of CT appointment on 03/23/2024 at 4:00pm

## 2024-02-16 NOTE — Progress Notes (Signed)
 Gastroenterology Office Note     Primary Care Physician:  [name] [name], MD  Primary Gastroenterologist: Dr. Cindie   Chief Complaint   Chief Complaint  Patient presents with   Follow-up    Pt arrives for follow up. Has gone from 3 bowel movements a week to 3-4 bowel movements a day. Bms have gone from being big to little skinny ones. Having some abdominal discomfort across lower abdominal area. Family history of colon cancer. Pt states she is falling asleep more which is not normal for her.     History of Present Illness   [name] is an 88 y.o. female presenting today with a history of  chronic GERD, polyps, colonoscopy Dec 2023 with serrated adenomas. No surveillance due to age had been planned. Last seen in June 2025 for early satiety, bloating, changes in bowel habits, gallbladder polyp. In interim from last visit, she has undergone both colonoscopy and upper endoscopy as noted below. No surveillance colonoscopy due to age.   She has a FH pancreatic cancer (mother age 18) and FH colon cancer (father age 54). She has had significant anxiety regarding health concerns. CT with pancreatic protocol June 2024 with cholelithiasis, otherwise no significant or acute findings.   Weight fluctuates. Currently 134, similar to June 2025. Review of weights anywhere from 127 to high of upper 140s over past few years   Negative celiac serologies in the past. At last visit, she noted frequent bowel movements per day but no diarrhea.   Notes today 3-4 bowel movements a day, thin in shape. Will have a BM and then a few hours later feel she needs to go again. Eats a few times a day. Small portions.   Feels tired but unable to recall how long. Difficulty with memory issues since last stroke, TIAs. July 2025 Hgb 14.6, Hct 45, platelets 265, TSH 2.48. LFTs normal. Waking up at 3am lately. Hard to fall back asleep.   Notes lower abdominal pain and radiates around to her lower back. Feels  like if she could push her belly down, she breathes better. Has pain in right hip that radiates down right leg. Pain is chronic and always underlying everywhere, all the time. Notes lower back pain and upper abdominal pain. No nausea. Upon further discussion, notes pain above umbilicus and feels like a balloon is inside and getting bigger. Worried about her family history of cancers.     EGD July 2025: multiple gastric polyps s/p biopsy, normal duodenum. Path: no H.pylori. negative metaplasia or dysplasia.   Colonoscopy July 2025: diverticulosis, one 8 mm and one 5 mm polyp. Path: tubular adenoma and fragments of sessile serrated polyp without dysplasia. No surveillance due to age.     Prior imaging:  CT with pancreatic protocol June 2024 with cholelithiasis, otherwise no significant or acute findings. Gallstones.    RUQ US  Aug 2024: multiple low risk sessile gallbladder polyps with largest 6.3 mm.  RUQ US  Sept 2024: small gallstones, 9 mm gallbladder wall polyp, mild wall thickening.  HIDA Sept 2024 normal   Aug 2024 CTA chest/abd/pelvis: celiac mild narrowing of origin, SMA patent, IMA patent.    Past Medical History:  Diagnosis Date   Adenomatous colon polyp 2008   Anemia    Anxiety    Asthma    has not needed in over a year   Complication of anesthesia    pt reports TIA and sezures after surgery   Coronary artery disease  CVA (cerebral vascular accident) (HCC)    x 3   Degenerative disc disease, lumbar    Depression    Diverticulosis    Dysrhythmia    GERD (gastroesophageal reflux disease)    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54   age 66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 2 weeks   Hemorrhoids 2007   History of kidney stones    HTN (hypertension)    Hyperplastic colon polyp 2005   Hypothyroidism    Narcolepsy    Pneumonia    PONV (postoperative nausea and vomiting)    Presence of permanent cardiac pacemaker    TIA (transient ischemic attack)     Past Surgical History:  Procedure  Laterality Date   ABDOMINAL HYSTERECTOMY     age 35, complicated by poor wound healing, followed by revision of scar and radiation for ?malignancy   BACK SURGERY  07/2018   lost 2 inches   BREAST ENHANCEMENT SURGERY     BREAST IMPLANT EXCHANGE Right 06/23/2016   Procedure: RIGHT BREAST IMPLANT REMOVAL AND REPLACEMENT;  Surgeon: Elna Pick, MD;  Location: [facility];  Service: Plastics;  Laterality: Right;   COLONOSCOPY  2005   2 hyperplastic polyps   COLONOSCOPY  2007   Dr. Harvey- hemorrhoids   COLONOSCOPY  2008   Dr. Bernis polyp- rare sigmoid diverticulosis, internal hemorrhoids   COLONOSCOPY  12/01/2011   Procedure: COLONOSCOPY;  Surgeon: Margo LITTIE Harvey, MD;  Location: [facility];  Service: Endoscopy;  Laterality: N/A;  12:30 PM   COLONOSCOPY N/A 12/07/2023   Procedure: COLONOSCOPY;  Surgeon: Cindie Carlin POUR, DO;  Location: [facility];  Service: Endoscopy;  Laterality: N/A;  1:45 pm, asa 3   COLONOSCOPY WITH PROPOFOL  N/A 05/22/2022   Procedure: COLONOSCOPY WITH PROPOFOL ;  Surgeon: Cindie Carlin POUR, DO;  [facility]: [facility];  Service: Endoscopy;  Laterality: N/A;  1:00 PM   CYSTOSCOPY/URETEROSCOPY/HOLMIUM LASER/STENT PLACEMENT Left 02/29/2020   Procedure: CYSTOSCOPY/URETEROSCOPY/HOLMIUM LASER/STENT PLACEMENT;  Surgeon: Devere Lonni Righter, MD;  Location: [facility];  Service: Urology;  Laterality: Left;   CYSTOSCOPY/URETEROSCOPY/HOLMIUM LASER/STENT PLACEMENT Left 07/08/2023   Procedure: CYSTOSCOPY/LEFT RETROGRADE PYELOGRAM/URETEROSCOPY/HOLMIUM LASER/STENT PLACEMENT;  Surgeon: Devere Lonni Righter, MD;  Location: [facility];  Service: Urology;  Laterality: Left;  45 MINUTES NEEDED   epidural steroid injection     She is getting injections with Dr. Bonner q3wks.   ESOPHAGOGASTRODUODENOSCOPY N/A 12/07/2023   Procedure: EGD (ESOPHAGOGASTRODUODENOSCOPY);  Surgeon: Cindie Carlin POUR, DO;  [facility];  Service: Endoscopy;  Laterality: N/A;    ESOPHAGOGASTRODUODENOSCOPY (EGD) WITH ESOPHAGEAL DILATION N/A 04/15/2013   Procedure: ESOPHAGOGASTRODUODENOSCOPY (EGD) WITH ESOPHAGEAL DILATION;  Surgeon: Margo LITTIE Harvey, MD;  [facility]: [facility];  Service: Endoscopy;  Laterality: N/A;  11:45-moved to 12:30 Anette Caldron to notify pt   EYE SURGERY  2010   EYE SURGERY     IR KYPHO LUMBAR INC FX REDUCE BONE BX UNI/BIL CANNULATION INC/IMAGING  08/23/2018   IR RADIOLOGIST EVAL & MGMT  12/23/2018   PACEMAKER IMPLANT N/A 01/28/2023   Procedure: PACEMAKER IMPLANT;  Surgeon: Waddell Danelle ORN, MD;  Location: [facility];  Service: Cardiovascular;  Laterality: N/A;   POLYPECTOMY  05/22/2022   Procedure: POLYPECTOMY;  Surgeon: Cindie Carlin POUR, DO;  Location: [facility];  Service: Endoscopy;;   right thumb surgery      Current Outpatient Medications  Medication Sig Dispense Refill   acetaminophen  (TYLENOL ) 650 MG CR tablet Take 650 mg by mouth every 8 (eight) hours as  needed for pain.     albuterol  (VENTOLIN  HFA) 108 (90 Base) MCG/ACT inhaler Inhale 2 puffs into the lungs every 6 (six) hours as needed for wheezing or shortness of breath. 18 g 0   amLODipine  (NORVASC ) 10 MG tablet Take 10 mg by mouth daily.     apixaban  (ELIQUIS ) 2.5 MG TABS tablet Take 1 tablet (2.5 mg total) by mouth 2 (two) times daily. 60 tablet 0   ezetimibe  (ZETIA ) 10 MG tablet Take 1 tablet (10 mg total) by mouth daily. 30 tablet 0   furosemide  (LASIX ) 20 MG tablet Take 3 tablets (60 mg total) by mouth daily. 90 tablet 0   levothyroxine  (SYNTHROID ) 50 MCG tablet Take 50 mcg by mouth daily before breakfast.     memantine  (NAMENDA ) 10 MG tablet Take 1 tablet (10 mg total) by mouth 2 (two) times daily. 60 tablet 0   metoprolol  tartrate (LOPRESSOR ) 50 MG tablet Take 1 tablet (50 mg total) by mouth 2 (two) times daily. (Patient taking differently: Take 25 mg by mouth 2 (two) times daily.) 180 tablet 3   pantoprazole  (PROTONIX ) 40 MG tablet Take 1 tablet (40 mg total) by mouth  daily. 30 tablet 0   phenazopyridine  (PYRIDIUM ) 200 MG tablet Take 1 tablet (200 mg total) by mouth 3 (three) times daily as needed (for pain with urination). 30 tablet 0   Potassium Chloride  ER 20 MEQ TBCR Take 1 tablet (20 mEq total) by mouth daily. 1 tab daily by mouth--- take while taking Lasix /furosemide  30 tablet 0   oxybutynin  (DITROPAN ) 5 MG tablet Take 1 tablet (5 mg total) by mouth every 8 (eight) hours as needed for bladder spasms. (Patient not taking: Reported on 02/16/2024) 30 tablet 1   No current facility-administered medications for this visit.    Allergies as of 02/16/2024 - Review Complete 02/16/2024  Allergen Reaction Noted   Epinephrine  Anaphylaxis 11/18/2011   Demerol  [meperidine ] Other (See Comments) 11/18/2011   Morphine  sulfate Anxiety 01/26/2023   Vicodin [hydrocodone -acetaminophen ] Other (See Comments) 11/18/2011    Family History  Problem Relation Age of Onset   Colon cancer Father        age 67   Prostate cancer Father    Pancreatic cancer Mother        age 41    Social History   Socioeconomic History   Marital status: Widowed    Spouse name: Not on file   Number of children: 1   Years of education: 19   Highest education level: 12th grade  Occupational History   Not on file  Tobacco Use   Smoking status: Never    Passive exposure: Never   Smokeless tobacco: Never  Vaping Use   Vaping status: Never Used  Substance and Sexual Activity   Alcohol use: No   Drug use: No   Sexual activity: Not Currently    Partners: Male  Other Topics Concern   Not on file  Social History Narrative   Lives alone   Right Handed    Drinks no caffeine daily   Walks with a cane.  Has bilateral hearing aids.  Wears Glasses.   Social Drivers of Corporate investment banker Strain: Low Risk  (02/06/2022)   Overall Financial Resource Strain (CARDIA)    Difficulty of Paying Living Expenses: Not hard at all  Food Insecurity: No Food Insecurity (12/24/2023)   Hunger  Vital Sign    Worried About Running Out of Food in the Last Year: Never true  Ran Out of Food in the Last Year: Never true  Transportation Needs: No Transportation Needs (12/24/2023)   PRAPARE - Administrator, Civil Service (Medical): No    Lack of Transportation (Non-Medical): No  Physical Activity: Inactive (02/06/2022)   Exercise Vital Sign    Days of Exercise per Week: 0 days    Minutes of Exercise per Session: 0 min  Stress: No Stress Concern Present (02/06/2022)   [employer] of Occupational Health - Occupational Stress Questionnaire    Feeling of Stress : Only a little  Social Connections: Moderately Integrated (07/08/2023)   Social Connection and Isolation Panel    Frequency of Communication with Friends and Family: More than three times a week    Frequency of Social Gatherings with Friends and Family: More than three times a week    Attends Religious Services: More than 4 times per year    Active Member of [employer] or Organizations: Yes    Attends Banker Meetings: More than 4 times per year    Marital Status: Widowed  Intimate Partner Violence: Not At Risk (12/24/2023)   Humiliation, Afraid, Rape, and Kick questionnaire    Fear of Current or Ex-Partner: No    Emotionally Abused: No    Physically Abused: No    Sexually Abused: No     Review of Systems   Gen: Denies any fever, chills, fatigue, weight loss, lack of appetite.  CV: Denies chest pain, heart palpitations, peripheral edema, syncope.  Resp: Denies shortness of breath at rest or with exertion. Denies wheezing or cough.  GI: Denies dysphagia or odynophagia. Denies jaundice, hematemesis, fecal incontinence. GU : Denies urinary burning, urinary frequency, urinary hesitancy MS: Denies joint pain, muscle weakness, cramps, or limitation of movement.  Derm: Denies rash, itching, dry skin Psych: Denies depression, anxiety, memory loss, and confusion Heme: Denies bruising, bleeding, and  enlarged lymph nodes.   Physical Exam   BP 102/67   Pulse (!) 59   Temp 98.2 F (36.8 C)   Ht 5' (1.524 m)   Wt 134 lb 8 oz (61 kg)   BMI 26.27 kg/m  General:   Alert and oriented. Pleasant and cooperative. Well-nourished and well-developed.  Head:  Normocephalic and atraumatic. Eyes:  Without icterus Abdomen:  +BS, soft, non-tender and non-distended. No HSM noted. No guarding or rebound. No masses appreciated.  Rectal:  Deferred  Msk:  kyphosis  Extremities:  Without edema. Neurologic:  Alert and  oriented x4;  grossly normal neurologically. Skin:  Intact without significant lesions or rashes. Psych:  Alert and cooperative. Normal mood and affect.   Assessment   CALISHA TINDEL is an 88 y.o. female presenting today with a history of chronic GERD, polyps, early satiety, bloating, changes in bowel habits, gallbladder polyp. In interim from last visit, she has undergone both colonoscopy and upper endoscopy as noted above. No surveillance colonoscopy due to age.   Quite anxious at baseline. Reassured that colonoscopy is on file recently, and her bowel habit changes are due to unproductive stool and underlying constipation. Start taking benefiber and can consider further OTC agents in future.   Chronic abdominal pain, bloating, weight fluctuating but similar to June 2025.Extensive evaluation as above. Update CTA. Doubt dealing with gastroparesis as cause for bloating as no nausea or other symptoms. Known gallstones and gallbladder polyps. HIDA normal in Sept 2024.  If CTA negative, update US . Consider Buspar if both unrevealing.    PLAN    CTA. If  negative, update US  Consider Buspar Add benefiber 3 month follow-up   Therisa MICAEL Stager, PhD, ANP-BC [facility]

## 2024-02-19 DIAGNOSIS — F331 Major depressive disorder, recurrent, moderate: Secondary | ICD-10-CM | POA: Diagnosis not present

## 2024-02-19 DIAGNOSIS — Z8673 Personal history of transient ischemic attack (TIA), and cerebral infarction without residual deficits: Secondary | ICD-10-CM | POA: Diagnosis not present

## 2024-02-19 DIAGNOSIS — I495 Sick sinus syndrome: Secondary | ICD-10-CM | POA: Diagnosis not present

## 2024-02-19 DIAGNOSIS — F03B3 Unspecified dementia, moderate, with mood disturbance: Secondary | ICD-10-CM | POA: Diagnosis not present

## 2024-02-19 DIAGNOSIS — K219 Gastro-esophageal reflux disease without esophagitis: Secondary | ICD-10-CM | POA: Diagnosis not present

## 2024-02-19 DIAGNOSIS — E782 Mixed hyperlipidemia: Secondary | ICD-10-CM | POA: Diagnosis not present

## 2024-02-19 DIAGNOSIS — E039 Hypothyroidism, unspecified: Secondary | ICD-10-CM | POA: Diagnosis not present

## 2024-02-19 DIAGNOSIS — I480 Paroxysmal atrial fibrillation: Secondary | ICD-10-CM | POA: Diagnosis not present

## 2024-02-19 DIAGNOSIS — I10 Essential (primary) hypertension: Secondary | ICD-10-CM | POA: Diagnosis not present

## 2024-02-19 DIAGNOSIS — E870 Hyperosmolality and hypernatremia: Secondary | ICD-10-CM | POA: Diagnosis not present

## 2024-02-19 DIAGNOSIS — K5909 Other constipation: Secondary | ICD-10-CM | POA: Diagnosis not present

## 2024-02-19 DIAGNOSIS — J4530 Mild persistent asthma, uncomplicated: Secondary | ICD-10-CM | POA: Diagnosis not present

## 2024-02-22 DIAGNOSIS — H353131 Nonexudative age-related macular degeneration, bilateral, early dry stage: Secondary | ICD-10-CM | POA: Diagnosis not present

## 2024-02-22 DIAGNOSIS — H01001 Unspecified blepharitis right upper eyelid: Secondary | ICD-10-CM | POA: Diagnosis not present

## 2024-02-22 DIAGNOSIS — H01004 Unspecified blepharitis left upper eyelid: Secondary | ICD-10-CM | POA: Diagnosis not present

## 2024-02-22 DIAGNOSIS — H01002 Unspecified blepharitis right lower eyelid: Secondary | ICD-10-CM | POA: Diagnosis not present

## 2024-02-23 DIAGNOSIS — F03B3 Unspecified dementia, moderate, with mood disturbance: Secondary | ICD-10-CM | POA: Diagnosis not present

## 2024-02-23 DIAGNOSIS — I10 Essential (primary) hypertension: Secondary | ICD-10-CM | POA: Diagnosis not present

## 2024-02-23 DIAGNOSIS — I5032 Chronic diastolic (congestive) heart failure: Secondary | ICD-10-CM | POA: Diagnosis not present

## 2024-02-23 DIAGNOSIS — E782 Mixed hyperlipidemia: Secondary | ICD-10-CM | POA: Diagnosis not present

## 2024-02-24 NOTE — Telephone Encounter (Signed)
 Spoke with patient, she is aware of her CT appointment on 03/23/2024 at 4pm

## 2024-03-01 DIAGNOSIS — R410 Disorientation, unspecified: Secondary | ICD-10-CM | POA: Diagnosis not present

## 2024-03-01 DIAGNOSIS — Z8673 Personal history of transient ischemic attack (TIA), and cerebral infarction without residual deficits: Secondary | ICD-10-CM | POA: Diagnosis not present

## 2024-03-02 ENCOUNTER — Other Ambulatory Visit: Payer: Self-pay

## 2024-03-02 NOTE — Patient Instructions (Signed)
 Visit Information  [name] - I am sorry I was unable to reach you today for our scheduled appointment. I work with [name], [name], MD and am calling to support your healthcare needs. Please contact me at [phone] at your earliest convenience. I look forward to speaking with you soon.    Please call the care guide team at [phone] if you need to cancel, schedule, or reschedule an appointment.   Please call 911 if you are experiencing a Mental Health or Behavioral Health Crisis or need someone to talk to.  Olam Ally, MSW, LCSW [place]  [facility], [facility] Licensed Clinical Social Worker Direct Dial: [phone]

## 2024-03-02 NOTE — Patient Outreach (Signed)
 LCSW called patient's brother for scheduled visit and was unable to reach him. LCSW left voice mail that LCSW would send him an email to find another time to reschedule.  Olam Ally, MSW, LCSW [place]  [facility], [facility] Licensed Clinical Social Worker Direct Dial: [phone]

## 2024-03-04 DIAGNOSIS — Z7951 Long term (current) use of inhaled steroids: Secondary | ICD-10-CM | POA: Diagnosis not present

## 2024-03-04 DIAGNOSIS — R051 Acute cough: Secondary | ICD-10-CM | POA: Diagnosis not present

## 2024-03-04 DIAGNOSIS — R062 Wheezing: Secondary | ICD-10-CM | POA: Diagnosis not present

## 2024-03-10 DIAGNOSIS — E870 Hyperosmolality and hypernatremia: Secondary | ICD-10-CM | POA: Diagnosis not present

## 2024-03-10 DIAGNOSIS — E782 Mixed hyperlipidemia: Secondary | ICD-10-CM | POA: Diagnosis not present

## 2024-03-10 DIAGNOSIS — J4530 Mild persistent asthma, uncomplicated: Secondary | ICD-10-CM | POA: Diagnosis not present

## 2024-03-10 DIAGNOSIS — I10 Essential (primary) hypertension: Secondary | ICD-10-CM | POA: Diagnosis not present

## 2024-03-10 DIAGNOSIS — K5909 Other constipation: Secondary | ICD-10-CM | POA: Diagnosis not present

## 2024-03-10 DIAGNOSIS — F03B3 Unspecified dementia, moderate, with mood disturbance: Secondary | ICD-10-CM | POA: Diagnosis not present

## 2024-03-10 DIAGNOSIS — E039 Hypothyroidism, unspecified: Secondary | ICD-10-CM | POA: Diagnosis not present

## 2024-03-10 DIAGNOSIS — K219 Gastro-esophageal reflux disease without esophagitis: Secondary | ICD-10-CM | POA: Diagnosis not present

## 2024-03-10 DIAGNOSIS — Z8673 Personal history of transient ischemic attack (TIA), and cerebral infarction without residual deficits: Secondary | ICD-10-CM | POA: Diagnosis not present

## 2024-03-10 DIAGNOSIS — I495 Sick sinus syndrome: Secondary | ICD-10-CM | POA: Diagnosis not present

## 2024-03-10 DIAGNOSIS — I480 Paroxysmal atrial fibrillation: Secondary | ICD-10-CM | POA: Diagnosis not present

## 2024-03-10 DIAGNOSIS — F331 Major depressive disorder, recurrent, moderate: Secondary | ICD-10-CM | POA: Diagnosis not present

## 2024-03-12 ENCOUNTER — Ambulatory Visit
Admission: EM | Admit: 2024-03-12 | Discharge: 2024-03-12 | Disposition: A | Discharge status: Home / Self Care | Attending: Nurse Practitioner | Admitting: Nurse Practitioner

## 2024-03-12 DIAGNOSIS — H1132 Conjunctival hemorrhage, left eye: Secondary | ICD-10-CM | POA: Diagnosis not present

## 2024-03-12 DIAGNOSIS — R109 Unspecified abdominal pain: Secondary | ICD-10-CM | POA: Diagnosis not present

## 2024-03-12 LAB — POCT URINE DIPSTICK
Bilirubin, UA: NEGATIVE
Blood, UA: NEGATIVE
Glucose, UA: NEGATIVE mg/dL
Ketones, POC UA: NEGATIVE mg/dL
Leukocytes, UA: NEGATIVE
Nitrite, UA: NEGATIVE
POC PROTEIN,UA: NEGATIVE
Spec Grav, UA: 1.02 (ref 1.010–1.025)
Urobilinogen, UA: 0.2 U/dL
pH, UA: 7 (ref 5.0–8.0)

## 2024-03-12 NOTE — ED Notes (Signed)
 NP Leath-Warren at bedside. Pt interacting with NP at this time about concerns with eye redness.

## 2024-03-12 NOTE — ED Notes (Addendum)
 During orthostatic vitals pt began to complain of difficulty breathing. Resp's have increased from 20 to 26. Grabbing at left side of the abdomen.

## 2024-03-12 NOTE — ED Provider Notes (Signed)
 [facility]    CSN: [id number] Arrival date & time: 03/12/24  0905      History   Chief Complaint No chief complaint on file.   HPI [name] is a 88 y.o. female.   The history is provided by the patient.   Patient presents for complaints of redness in the left eye.  Patient states symptoms started approximately 2 days ago.  She states that her vision is blurry.  Patient denies injury, trauma, eye swelling, purulent drainage, or loss of vision.  During triage, patient complained of right-sided abdominal pain.  She states pain is been present for quite some time.  She denies fever, chills, nausea, vomiting, diarrhea, urinary frequency, urgency, or hesitancy.  Patient is very anxious during triage.  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 2 weeks   Hemorrhoids 2007   History of kidney stones    HTN (hypertension)    Hyperplastic colon polyp 2005   Hypothyroidism    Narcolepsy    Pneumonia    PONV (postoperative nausea and vomiting)    Presence of permanent cardiac pacemaker    TIA (transient ischemic attack)     Patient Active Problem List   Diagnosis Date Noted   Early satiety 11/10/2023   Kidney calculi 07/08/2023   Mild persistent asthma 04/17/2023   Overweight 03/03/2023   Cellulitis of chest wall 02/18/2023   Muscle spasm of right leg 02/12/2023   Chronic pain syndrome 02/07/2023   Unspecified protein-calorie malnutrition 02/07/2023   Hypertension with congestive heart failure and renal failure (HCC) 02/04/2023    Vascular dementia without behavioral disturbance (HCC) 02/04/2023   Shingles 02/04/2023   Aortic atherosclerosis 02/04/2023   Tachy-brady syndrome (HCC) 02/04/2023   Anemia in chronic kidney disease (CKD) 02/04/2023   Acute cholecystitis 01/26/2023   Acute anemia 01/26/2023   CKD (chronic kidney disease), stage IV (HCC) 01/26/2023   Acute kidney injury superimposed on CKD 01/26/2023   Atrial fibrillation, chronic (HCC) 01/26/2023   Chronic back pain due to kyphosis 01/26/2023   Seborrheic keratosis 12/29/2022   Pulmonary edema 11/24/2022   Abdominal bloating 11/06/2022   Acute sinusitis 10/29/2022   Rectal bleeding 07/31/2022   Sacroiliac joint pain 07/18/2022   Hypernatremia 06/18/2022   Osteoporosis 01/07/2022   Pain of left hip joint 12/23/2021   Moderate persistent asthma 12/02/2021   Bilateral swelling of feet and ankles 11/13/2021   Nasal congestion 10/22/2021   Pain in throat 10/22/2021   Expiratory wheezing 09/24/2021   Dementia (HCC) 09/12/2021   Edema of lower extremity 06/03/2021   Hypercalcemia 06/03/2021   Pain in right foot 06/03/2021   History of seizure 05/07/2021   Memory impairment 05/07/2021   Moderate recurrent major depression (HCC) 05/02/2021   Paroxysmal atrial fibrillation (HCC) 02/01/2021   Muscle weakness 01/08/2021   Chronic kidney disease, stage 3a (HCC) 01/08/2021   Gastroesophageal reflux disease without esophagitis 10/24/2020  Hyperlipidemia 10/24/2020   Osteopenia 10/24/2020   Urge incontinence 12/09/2019   Mixed incontinence urge and stress (female)(female) 08/05/2019   Renal stones 08/05/2019   UPJ obstruction, 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   Hydronephrosis with ureteropelvic junction (UPJ) obstruction 08/05/2019   Postmenopausal atrophic vaginitis 08/05/2019   Weak urinary stream 08/05/2019   Acquired thrombophilia 06/16/2019   Lumbar spondylosis 02/15/2019   Paresthesia 12/17/2018   Chronic midline thoracic back pain 12/17/2018    Degeneration of lumbar intervertebral disc 12/15/2018   History of CVA (cerebrovascular accident) 08/25/2018   Depression, recurrent 08/25/2018   Low back pain 08/19/2018   Constipation 08/19/2018   Chronic diastolic CHF (congestive heart failure) (HCC) 08/19/2018   Hypothyroidism 08/19/2018   Intractable pain 08/19/2018   Essential hypertension 08/19/2018   Breast cancer (HCC) 06/23/2016   Change in bowel habits 01/11/2015   Loss of weight 01/11/2015   Abdominal pain, chronic, right upper quadrant 01/11/2015   Unspecified chronic bronchitis (HCC) 09/25/2013   Other dysphagia 04/01/2013   Cough 01/20/2013   Chest pain 09/26/2012   H/O adenomatous polyp of colon 11/18/2011   Chronic anticoagulation 11/18/2011    Past Surgical History:  Procedure Laterality Date   ABDOMINAL HYSTERECTOMY     age 73, complicated by poor wound healing, followed by revision of scar and radiation for ?malignancy   BACK SURGERY  07/2018   lost 2 inches   BREAST ENHANCEMENT SURGERY     BREAST IMPLANT EXCHANGE Right 06/23/2016   Procedure: RIGHT BREAST IMPLANT REMOVAL AND REPLACEMENT;  Surgeon: Elna Pick, MD;  Location: [facility];  Service: Plastics;  Laterality: Right;   COLONOSCOPY  2005   2 hyperplastic polyps   COLONOSCOPY  2007   Dr. Harvey- hemorrhoids   COLONOSCOPY  2008   Dr. Bernis polyp- rare sigmoid diverticulosis, internal hemorrhoids   COLONOSCOPY  12/01/2011   Procedure: COLONOSCOPY;  Surgeon: Margo LITTIE Harvey, MD;  Location: [facility];  Service: Endoscopy;  Laterality: N/A;  12:30 PM   COLONOSCOPY N/A 12/07/2023   Procedure: COLONOSCOPY;  Surgeon: Cindie Carlin POUR, DO;  Location: [facility];  Service: Endoscopy;  Laterality: N/A;  1:45 pm, asa 3   COLONOSCOPY WITH PROPOFOL  N/A 05/22/2022   Procedure: COLONOSCOPY WITH PROPOFOL ;  Surgeon: Cindie Carlin POUR, DO;  Location: [facility];  Service: Endoscopy;  Laterality: N/A;  1:00 PM    CYSTOSCOPY/URETEROSCOPY/HOLMIUM LASER/STENT PLACEMENT Left 02/29/2020   Procedure: CYSTOSCOPY/URETEROSCOPY/HOLMIUM LASER/STENT PLACEMENT;  Surgeon: Devere Lonni Righter, MD;  Location: [facility];  Service: Urology;  Laterality: Left;   CYSTOSCOPY/URETEROSCOPY/HOLMIUM LASER/STENT PLACEMENT Left 07/08/2023   Procedure: CYSTOSCOPY/LEFT RETROGRADE PYELOGRAM/URETEROSCOPY/HOLMIUM LASER/STENT PLACEMENT;  Surgeon: Devere Lonni Righter, MD;  Location: [facility];  Service: Urology;  Laterality: Left;  45 MINUTES NEEDED   epidural steroid injection     She is getting injections with Dr. Bonner q3wks.   ESOPHAGOGASTRODUODENOSCOPY N/A 12/07/2023   Procedure: EGD (ESOPHAGOGASTRODUODENOSCOPY);  Surgeon: Cindie Carlin POUR, DO;  Location: [facility];  Service: Endoscopy;  Laterality: N/A;   ESOPHAGOGASTRODUODENOSCOPY (EGD) WITH ESOPHAGEAL DILATION N/A 04/15/2013   Procedure: ESOPHAGOGASTRODUODENOSCOPY (EGD) WITH ESOPHAGEAL DILATION;  Surgeon: Margo LITTIE Harvey, MD;  Location: [facility];  Service: Endoscopy;  Laterality: N/A;  11:45-moved to 12:30 Anette Caldron to notify pt   EYE SURGERY  2010   EYE SURGERY     IR KYPHO LUMBAR INC FX REDUCE BONE BX UNI/BIL CANNULATION INC/IMAGING  08/23/2018   IR RADIOLOGIST EVAL & MGMT  12/23/2018   PACEMAKER IMPLANT N/A 01/28/2023  Procedure: PACEMAKER IMPLANT;  Surgeon: Waddell Danelle ORN, MD;  Location: [facility];  Service: Cardiovascular;  Laterality: N/A;   POLYPECTOMY  05/22/2022   Procedure: POLYPECTOMY;  Surgeon: Cindie Carlin POUR, DO;  Location: [facility];  Service: Endoscopy;;   right thumb surgery      OB History     Gravida      Para      Term      Preterm      AB      Living  1      SAB      IAB      Ectopic      Multiple      Live Births               Home Medications    Prior to Admission medications   Medication Sig Start Date End Date Taking? Authorizing Provider  acetaminophen  (TYLENOL ) 650 MG CR tablet Take 650 mg by  mouth every 8 (eight) hours as needed for pain.    [provider]  albuterol  (VENTOLIN  HFA) 108 (90 Base) MCG/ACT inhaler Inhale 2 puffs into the lungs every 6 (six) hours as needed for wheezing or shortness of breath. 03/03/23   [name], NP  amLODipine  (NORVASC ) 10 MG tablet Take 10 mg by mouth daily. 01/27/24   [provider]  apixaban  (ELIQUIS ) 2.5 MG TABS tablet Take 1 tablet (2.5 mg total) by mouth 2 (two) times daily. 03/03/23   [name], NP  ezetimibe  (ZETIA ) 10 MG tablet Take 1 tablet (10 mg total) by mouth daily. 03/03/23   [name], NP  furosemide  (LASIX ) 20 MG tablet Take 3 tablets (60 mg total) by mouth daily. 03/03/23   [name], NP  levothyroxine  (SYNTHROID ) 50 MCG tablet Take 50 mcg by mouth daily before breakfast.    [provider]  memantine  (NAMENDA ) 10 MG tablet Take 1 tablet (10 mg total) by mouth 2 (two) times daily. 03/03/23   [name], NP  metoprolol  tartrate (LOPRESSOR ) 50 MG tablet Take 1 tablet (50 mg total) by mouth 2 (two) times daily. Patient taking differently: Take 25 mg by mouth 2 (two) times daily. 07/01/23 07/01/24  Waddell Danelle ORN, MD  oxybutynin  (DITROPAN ) 5 MG tablet Take 1 tablet (5 mg total) by mouth every 8 (eight) hours as needed for bladder spasms. Patient not taking: Reported on 02/16/2024 07/09/23   Devere Lonni Righter, MD  pantoprazole  (PROTONIX ) 40 MG tablet Take 1 tablet (40 mg total) by mouth daily. 03/03/23   [name], NP  phenazopyridine  (PYRIDIUM ) 200 MG tablet Take 1 tablet (200 mg total) by mouth 3 (three) times daily as needed (for pain with urination). 07/09/23 07/08/24  Devere Lonni Righter, MD  Potassium Chloride  ER 20 MEQ TBCR Take 1 tablet (20 mEq total) by mouth daily. 1 tab daily by mouth--- take while taking Lasix /furosemide  03/03/23   [name], NP    Family History Family History  Problem Relation Age of Onset   Colon cancer Father        age 109    Prostate cancer Father    Pancreatic cancer Mother        age 75    Social History Social History   Tobacco Use   Smoking status: Never    Passive exposure: Never   Smokeless tobacco: Never  Vaping Use   Vaping status: Never Used  Substance Use Topics  Alcohol use: No   Drug use: No     Allergies   Epinephrine , Demerol  [meperidine ], Morphine  sulfate, and Vicodin [hydrocodone -acetaminophen ]   Review of Systems Review of Systems Per HPI  Physical Exam Triage Vital Signs ED Triage Vitals  Encounter Vitals Group     BP 03/12/24 0911 94/60     Girls Systolic BP Percentile --      Girls Diastolic BP Percentile --      Boys Systolic BP Percentile --      Boys Diastolic BP Percentile --      Pulse Rate 03/12/24 0911 73     Resp 03/12/24 0911 20     Temp 03/12/24 0911 98.2 F (36.8 C)     Temp Source 03/12/24 0911 Oral     SpO2 03/12/24 0911 98 %     Weight --      Height --      Head Circumference --      Peak Flow --      Pain Score 03/12/24 0912 4     Pain Loc --      Pain Education --      Exclude from Growth Chart --    Orthostatic VS for the past 24 hrs:  BP- Lying Pulse- Lying BP- Sitting Pulse- Sitting BP- Standing at 0 minutes Pulse- Standing at 0 minutes  03/12/24 0918 (!) 81/49 68 99/62 63 92/59 70    Updated Vital Signs BP 94/60 (BP Location: Right Arm)   Pulse 73   Temp 98.2 F (36.8 C) (Oral)   Resp 20   SpO2 98%   Visual Acuity Right Eye Distance:   Left Eye Distance:   Bilateral Distance:    Right Eye Near:   Left Eye Near:    Bilateral Near:     Physical Exam Vitals and nursing note reviewed.  Constitutional:      General: She is not in acute distress.    Appearance: Normal appearance.  HENT:     Head: Normocephalic.     Right Ear: Tympanic membrane, ear canal and external ear normal.     Left Ear: Tympanic membrane, ear canal and external ear normal.     Nose: Nose normal.     Mouth/Throat:     Mouth: Mucous membranes are  moist.  Eyes:     General: Lids are normal.        Left eye: No foreign body, discharge or hordeolum.     Extraocular Movements: Extraocular movements intact.     Left eye: Normal extraocular motion and no nystagmus.     Conjunctiva/sclera:     Left eye: Hemorrhage present.     Pupils: Pupils are equal, round, and reactive to light.  Cardiovascular:     Rate and Rhythm: Normal rate and regular rhythm.     Pulses: Normal pulses.     Heart sounds: Normal heart sounds.  Pulmonary:     Effort: Pulmonary effort is normal. No respiratory distress.     Breath sounds: Normal breath sounds. No stridor. No wheezing, rhonchi or rales.  Musculoskeletal:     Cervical back: Normal range of motion.  Skin:    General: Skin is warm and dry.  Neurological:     Mental Status: She is alert and oriented to person, place, and time.      UC Treatments / Results  Labs (all labs ordered are listed, but only abnormal results are displayed) Labs Reviewed  POCT URINE DIPSTICK  EKG   Radiology No results found.  Procedures Procedures (including critical care time)  Medications Ordered in UC Medications - No data to display  Initial Impression / Assessment and Plan / UC Course  I have reviewed the triage vital signs and the nursing notes.  Pertinent labs & imaging results that were available during my care of the patient were reviewed by me and considered in my medical decision making (see chart for details).  On exam, patient has a left subconjunctival hemorrhage of the left eye.  Patient advised to continue over-the-counter eyedrops while symptoms persist.  Recommend patient follow-up with her eye doctor within the next 48 to 72 hours for reevaluation for worsening symptoms.  With regard to her abdominal pain, urinalysis was negative.  Her abdominal pain has improved.  She no longer complains of abdominal pain at this time.  Difficult to determine the cause of the patient's pain.  Patient was  advised it is recommended that she go to the emergency department for further evaluation if her symptoms worsen.  Patient was in agreement with this plan of care and verbalizes understanding.  All questions were answered.  Patient stable for discharge.  Final Clinical Impressions(s) / UC Diagnoses   Final diagnoses:  Abdominal pain, unspecified abdominal location   Discharge Instructions   None    ED Prescriptions   None    PDMP not reviewed this encounter.   Gilmer Etta PARAS, NP 03/12/24 1102

## 2024-03-12 NOTE — ED Triage Notes (Signed)
 Pt reports left eye redness, went to pharmacy they recommended eye drops tried them and they are now worse.

## 2024-03-12 NOTE — Discharge Instructions (Addendum)
 The urinalysis was negative. You may take over-the-counter Tylenol  as needed for pain, fever, or general discomfort. Apply warm compresses to the left eye to help with pain or discomfort. Recommend Visine or Clear Eyes eyedrops to help keep the eye moist and lubricated. Do not rub or manipulate the eye while symptoms persist. If your symptoms begin to worsen, would like for you to follow-up with your eye doctor within the next 72 hours for further evaluation. If you develop worsening abdominal pain, develop fever, chills, or other concerns, please go to the emergency department for further evaluation. Follow-up as needed.

## 2024-03-12 NOTE — ED Notes (Addendum)
 Pt reporting pain in R side of abdomen and flank and holding R side of abdomen. Pt reports pain is radiating to R thigh area. Pt reporting this is normal pain for pt.

## 2024-03-14 ENCOUNTER — Ambulatory Visit: Payer: PPO | Admitting: Urology

## 2024-03-14 ENCOUNTER — Encounter: Payer: Self-pay | Admitting: Urology

## 2024-03-14 VITALS — BP 111/72 | HR 69

## 2024-03-14 DIAGNOSIS — H01001 Unspecified blepharitis right upper eyelid: Secondary | ICD-10-CM | POA: Diagnosis not present

## 2024-03-14 DIAGNOSIS — N135 Crossing vessel and stricture of ureter without hydronephrosis: Secondary | ICD-10-CM

## 2024-03-14 DIAGNOSIS — Z961 Presence of intraocular lens: Secondary | ICD-10-CM | POA: Diagnosis not present

## 2024-03-14 DIAGNOSIS — H01002 Unspecified blepharitis right lower eyelid: Secondary | ICD-10-CM | POA: Diagnosis not present

## 2024-03-14 DIAGNOSIS — H01005 Unspecified blepharitis left lower eyelid: Secondary | ICD-10-CM | POA: Diagnosis not present

## 2024-03-14 DIAGNOSIS — N200 Calculus of kidney: Secondary | ICD-10-CM | POA: Diagnosis not present

## 2024-03-14 DIAGNOSIS — H11823 Conjunctivochalasis, bilateral: Secondary | ICD-10-CM | POA: Diagnosis not present

## 2024-03-14 DIAGNOSIS — H1132 Conjunctival hemorrhage, left eye: Secondary | ICD-10-CM | POA: Diagnosis not present

## 2024-03-14 DIAGNOSIS — H01004 Unspecified blepharitis left upper eyelid: Secondary | ICD-10-CM | POA: Diagnosis not present

## 2024-03-14 DIAGNOSIS — H04123 Dry eye syndrome of bilateral lacrimal glands: Secondary | ICD-10-CM | POA: Diagnosis not present

## 2024-03-14 DIAGNOSIS — H353131 Nonexudative age-related macular degeneration, bilateral, early dry stage: Secondary | ICD-10-CM | POA: Diagnosis not present

## 2024-03-14 NOTE — Progress Notes (Unsigned)
 03/14/2024 1:57 PM   [name] [birth year] [id number]  Referring provider: [name] [name], MD [address] [place],  [place] [address]  No chief complaint on file.   HPI:  F/u -    1) chronic left UPJ obstruction-patient has seen Dr. Watt and now with Dr. Devere routinely in [place]. Seen December 2023 - left renal ultrasound was stable with left extrarenal pelvis appearance. She had a 2020 renal scan which showed left function of 42% with a T half of 18 minutes, and a right function of 58% with a T half of 7.1 minutes.  Patient declined surgical intervention.   Her creatinine typically runs 1.03-1.77 looking back over the last years.   2) left renal stones-she underwent a left ureteroscopy with Dr. Devere in 2021 for a 6 mm dependent left renal stone. She underwent a CT scan contrast 11/10/2022 for abdominal pain. This did show some delayed washout on the left.  Dilation appears stable.  There were 1-2 dependent 3 to 4 mm renal pelvic stones.  She went back to emergency 11/21/2022 and CT scan again revealed 2 dependent stones in the left renal pelvis.  This looked stable to improved compared to 2021 scan prior to URS. UA was clear.  Creatinine 1.79.   C/o pain holding her stomach - Flat on the bottom (the SP area) and upper stomach is swollen. She is holding her stomach. No fever, nausea, emesis, dysuria, flank pain or gross hematuria. Drinking more water  p ER visit. She has constant back pain. No change. Not constipated. She gets dizzy when she gets the pain. She underwent 03/13/2023 renal US  which showed a left extrarenal pelvis with no dilation of the calyces. In addition, she was admitted for a fib. On blood thinner. She was seen for flank pain and underwent Aug 2024 CT angio C/A/P, CT a/p in Sep 2024 which show the left two small stone stable in the pelvis and a droopy left extrarenal pelvis. Cr 0.96 - 1.37 in Sep 2024.  Today, seen for the above. Feb 2025 left  URS/HLL/stent with Dr. Devere. Left UPJ patent and easily traversable. F/u APR 2025 left renal US  stable. Has f/u at [facility] OCT 2025. She is well today without complaint.   She is on a  diuretic and has some urgency. She was bradycardic. She underwent a pacemaker. She is on eliquis . She has a history of chronic back pain, compression fracture, CVA x 3.  History of pelvic radiation for nonhealing incision/cancerous lesion after hysterectomy. She was an Radio producer and respiratory tech.   PMH: 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38   age 31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 2 weeks   Hemorrhoids 2007   History of kidney stones    HTN (hypertension)    Hyperplastic colon polyp 2005   Hypothyroidism    Narcolepsy    Pneumonia    PONV (postoperative nausea and vomiting)    Presence of permanent  cardiac pacemaker    TIA (transient ischemic attack)     Surgical History: Past Surgical History:  Procedure Laterality Date   ABDOMINAL HYSTERECTOMY     age 41, complicated by poor wound healing, followed by revision of scar and radiation for ?malignancy   BACK SURGERY  07/2018   lost 2 inches   BREAST ENHANCEMENT SURGERY     BREAST IMPLANT EXCHANGE Right 06/23/2016   Procedure: RIGHT BREAST IMPLANT REMOVAL AND REPLACEMENT;  Surgeon: Elna Pick, MD;  Location: [facility];  Service: Plastics;  Laterality: Right;   COLONOSCOPY  2005   2 hyperplastic polyps   COLONOSCOPY  2007   Dr. Harvey- hemorrhoids   COLONOSCOPY  2008   Dr. Bernis polyp- rare sigmoid diverticulosis, internal hemorrhoids    COLONOSCOPY  12/01/2011   Procedure: COLONOSCOPY;  Surgeon: Margo LITTIE Harvey, MD;  [facility];  Service: Endoscopy;  Laterality: N/A;  12:30 PM   COLONOSCOPY N/A 12/07/2023   Procedure: COLONOSCOPY;  Surgeon: Cindie Carlin POUR, DO;  [facility]: [facility];  Service: Endoscopy;  Laterality: N/A;  1:45 pm, asa 3   COLONOSCOPY WITH PROPOFOL  N/A 05/22/2022   Procedure: COLONOSCOPY WITH PROPOFOL ;  Surgeon: Cindie Carlin POUR, DO;  [facility];  Service: Endoscopy;  Laterality: N/A;  1:00 PM   CYSTOSCOPY/URETEROSCOPY/HOLMIUM LASER/STENT PLACEMENT Left 02/29/2020   Procedure: CYSTOSCOPY/URETEROSCOPY/HOLMIUM LASER/STENT PLACEMENT;  Surgeon: Devere Lonni Righter, MD;  Location: [facility];  Service: Urology;  Laterality: Left;   CYSTOSCOPY/URETEROSCOPY/HOLMIUM LASER/STENT PLACEMENT Left 07/08/2023   Procedure: CYSTOSCOPY/LEFT RETROGRADE PYELOGRAM/URETEROSCOPY/HOLMIUM LASER/STENT PLACEMENT;  Surgeon: Devere Lonni Righter, MD;  Location: [facility];  Service: Urology;  Laterality: Left;  45 MINUTES NEEDED   epidural steroid injection     She is getting injections with Dr. Bonner q3wks.   ESOPHAGOGASTRODUODENOSCOPY N/A 12/07/2023   Procedure: EGD (ESOPHAGOGASTRODUODENOSCOPY);  Surgeon: Cindie Carlin POUR, DO;  [facility];  Service: Endoscopy;  Laterality: N/A;   ESOPHAGOGASTRODUODENOSCOPY (EGD) WITH ESOPHAGEAL DILATION N/A 04/15/2013   Procedure: ESOPHAGOGASTRODUODENOSCOPY (EGD) WITH ESOPHAGEAL DILATION;  Surgeon: Margo LITTIE Harvey, MD;  [facility];  Service: Endoscopy;  Laterality: N/A;  11:45-moved to 12:30 Anette Caldron to notify pt   EYE SURGERY  2010   EYE SURGERY     IR KYPHO LUMBAR INC FX REDUCE BONE BX UNI/BIL CANNULATION INC/IMAGING  08/23/2018   IR RADIOLOGIST EVAL & MGMT  12/23/2018   PACEMAKER IMPLANT N/A 01/28/2023   Procedure: PACEMAKER IMPLANT;  Surgeon: Waddell Danelle ORN, MD;  [facility];  Service: Cardiovascular;  Laterality: N/A;    POLYPECTOMY  05/22/2022   Procedure: POLYPECTOMY;  Surgeon: Cindie Carlin POUR, DO;  [facility];  Service: Endoscopy;;   right thumb surgery      Home Medications:  Allergies as of 03/14/2024       Reactions   Epinephrine  Anaphylaxis   Demerol  [meperidine ] Other (See Comments)   headaches   Morphine  Sulfate Anxiety   Patient states it messes with her mind makes her narcoleptic   Vicodin [hydrocodone -acetaminophen ] Other (See Comments)   [place] it makes her crazy. Pt tolerates acetaminophen         Medication List        Accurate as of March 14, 2024  1:57 PM. If you have any questions, ask your nurse or doctor.          acetaminophen  650 MG CR tablet Commonly known as: TYLENOL  Take 650 mg by mouth every 8 (eight) hours  as needed for pain.   albuterol  108 (90 Base) MCG/ACT inhaler Commonly known as: VENTOLIN  HFA Inhale 2 puffs into the lungs every 6 (six) hours as needed for wheezing or shortness of breath.   amLODipine  10 MG tablet Commonly known as: NORVASC  Take 10 mg by mouth daily.   apixaban  2.5 MG Tabs tablet Commonly known as: Eliquis  Take 1 tablet (2.5 mg total) by mouth 2 (two) times daily.   ezetimibe  10 MG tablet Commonly known as: ZETIA  Take 1 tablet (10 mg total) by mouth daily.   furosemide  20 MG tablet Commonly known as: LASIX  Take 3 tablets (60 mg total) by mouth daily.   levothyroxine  50 MCG tablet Commonly known as: SYNTHROID  Take 50 mcg by mouth daily before breakfast.   memantine  10 MG tablet Commonly known as: Namenda  Take 1 tablet (10 mg total) by mouth 2 (two) times daily.   metoprolol  tartrate 50 MG tablet Commonly known as: LOPRESSOR  Take 1 tablet (50 mg total) by mouth 2 (two) times daily. What changed: how much to take   oxybutynin  5 MG tablet Commonly known as: DITROPAN  Take 1 tablet (5 mg total) by mouth every 8 (eight) hours as needed for bladder spasms.   pantoprazole  40 MG tablet Commonly known as:  PROTONIX  Take 1 tablet (40 mg total) by mouth daily.   phenazopyridine  200 MG tablet Commonly known as: Pyridium  Take 1 tablet (200 mg total) by mouth 3 (three) times daily as needed (for pain with urination).   Potassium Chloride  ER 20 MEQ Tbcr Take 1 tablet (20 mEq total) by mouth daily. 1 tab daily by mouth--- take while taking Lasix /furosemide         Allergies:  Allergies  Allergen Reactions   Epinephrine  Anaphylaxis   Demerol  [Meperidine ] Other (See Comments)    headaches   Morphine  Sulfate Anxiety    Patient states it messes with her mind makes her narcoleptic   Vicodin [Hydrocodone -Acetaminophen ] Other (See Comments)    [place] it makes her crazy. Pt tolerates acetaminophen     Family History: Family History  Problem Relation Age of Onset   Colon cancer Father        age 66   Prostate cancer Father    Pancreatic cancer Mother        age 6    Social History:  reports that she has never smoked. She has never been exposed to tobacco smoke. She has never used smokeless tobacco. She reports that she does not drink alcohol and does not use drugs.   Physical Exam: BP 111/72   Pulse 69   Constitutional:  Alert and oriented, No acute distress. HEENT: [place] AT, moist mucus membranes.  Trachea midline, no masses. Cardiovascular: No clubbing, cyanosis, or edema. Respiratory: Normal respiratory effort, no increased work of breathing. GI: Abdomen is soft, nontender, nondistended, no abdominal masses GU: No CVA tenderness Skin: No rashes, bruises or suspicious lesions. Neurologic: Grossly intact, no focal deficits, moving all 4 extremities. Psychiatric: Normal mood and affect.  Laboratory Data: Lab Results  Component Value Date   WBC 7.4 12/02/2023   HGB 14.4 12/02/2023   HCT 43.9 12/02/2023   MCV 88.5 12/02/2023   PLT 200 12/02/2023    Lab Results  Component Value Date   CREATININE 1.33 (H) 12/02/2023    No results found for: PSA  No results found for:  TESTOSTERONE  Lab Results  Component Value Date   HGBA1C 5.6 01/30/2023    Urinalysis    Component Value Date/Time  COLORURINE YELLOW 01/14/2023 1945   APPEARANCEUR Clear 03/16/2023 1519   LABSPEC 1.006 01/14/2023 1945   PHURINE 8.0 01/14/2023 1945   GLUCOSEU Negative 03/16/2023 1519   HGBUR NEGATIVE 01/14/2023 1945   BILIRUBINUR negative 03/12/2024 1019   BILIRUBINUR Negative 03/16/2023 1519   KETONESUR negative 03/12/2024 1019   KETONESUR NEGATIVE 01/14/2023 1945   PROTEINUR Negative 03/16/2023 1519   PROTEINUR NEGATIVE 01/14/2023 1945   UROBILINOGEN 0.2 03/12/2024 1019   NITRITE Negative 03/12/2024 1019   NITRITE Negative 03/16/2023 1519   NITRITE NEGATIVE 01/14/2023 1945   LEUKOCYTESUR Negative 03/12/2024 1019   LEUKOCYTESUR Trace (A) 03/16/2023 1519   LEUKOCYTESUR NEGATIVE 01/14/2023 1945    Lab Results  Component Value Date   LABMICR See below: 03/16/2023   WBCUA 0-5 03/16/2023   LABEPIT 0-10 03/16/2023   BACTERIA None seen 03/16/2023    Pertinent Imaging:  No results found for this or any previous visit.  US  RENAL  Narrative CLINICAL DATA:  History of left upj obstruction  EXAM: RENAL / URINARY TRACT ULTRASOUND COMPLETE  COMPARISON:  January 26, 2023, Oct 18, 2021  FINDINGS: Limited assessment secondary to overlapping bowel contents and inability to optimally position patient.  Right Kidney:  Renal measurements: 10.4 x 4.5 x 5.1 cm = volume: 123 mL. Echogenicity is mildly increased. No suspicious mass or hydronephrosis visualized. Benign exophytic cyst is noted measuring up to 19 mm (for which no dedicated imaging follow-up is recommended) . Cortical thinning.  Left Kidney:  Renal measurements: 8.5 x 4.3 x 4.7 cm = volume: 91 mL. Echogenicity is mildly increased. Revisualization of a distended extrarenal pelvis. Previously described nephrolithiasis are not visualized sonographically within the pelvis. Cortical  thinning.  Bladder:  Appears normal for degree of bladder distention.  Other:  None.  IMPRESSION: 1. Revisualization of a LEFT-sided distended extrarenal pelvis, similar in comparison to priors. Previously described nephrolithiasis are not visualized sonographically within the pelvis. 2. Mildly increased echogenicity of the bilateral kidneys which can be seen in the setting of medical renal disease.   Electronically Signed By: Corean Salter M.D. On: 03/13/2023 13:54  No results found for this or any previous visit.  No results found for this or any previous visit.  Results for orders placed during the hospital encounter of 12/06/19  CT RENAL STONE STUDY  Narrative CLINICAL DATA:  Hematuria, history of kidney stones,, history of left partial UPJ obstruction, back pain worse on the right  EXAM: CT ABDOMEN AND PELVIS WITHOUT CONTRAST  TECHNIQUE: Multidetector CT imaging of the abdomen and pelvis was performed following the standard protocol without IV contrast.  COMPARISON:  12/21/2018  FINDINGS: Lower chest: No acute abnormality. Cardiomegaly and coronary artery calcifications.  Hepatobiliary: Multiple low-attenuation liver lesions, unchanged compared to prior examination, incompletely characterized although likely cysts or hemangiomata. No gallstones, gallbladder wall thickening, or biliary dilatation.  Pancreas: Unremarkable. No pancreatic ductal dilatation or surrounding inflammatory changes.  Spleen: Normal in size without significant abnormality.  Adrenals/Urinary Tract: Adrenal glands are unremarkable. Exophytic hemorrhagic cyst of the inferior pole of the right kidney. Tiny nonobstructive right renal calculi. Redemonstrated moderate left hydronephrosis with an enlarged renal pelvis and abrupt caliber change of the left ureter at the ureteropelvic junction. A previously seen 6 mm calyceal calculus is now located dependently in the left renal  pelvis (series 2, image 28). Bladder is unremarkable.  Stomach/Bowel: Stomach is within normal limits. Appendix is not clearly visualized and may be surgically absent. No evidence of bowel wall thickening, distention, or inflammatory  changes. Sigmoid diverticulosis.  Vascular/Lymphatic: Aortic atherosclerosis. No enlarged abdominal or pelvic lymph nodes.  Reproductive: Status post hysterectomy  Other: No abdominal wall hernia or abnormality. No abdominopelvic ascites.  Musculoskeletal: No acute or significant osseous findings.  IMPRESSION: 1. Redemonstrated moderate left hydronephrosis with an enlarged renal pelvis and abrupt caliber change of the left ureter at the ureteropelvic junction, in keeping with stricture. 2. A previously seen 6 mm calyceal calculus is now located dependently in the left renal pelvis. 3. Tiny nonobstructive right renal calculi. 4. Aortic Atherosclerosis (ICD10-I70.0).   Electronically Signed By: Marolyn Jaksch M.D. On: 12/06/2019 17:02   Assessment & Plan:    1. Kidney stones (Primary) Status post left ureteroscopy with Dr. Devere.  She can really follow-up here as needed as she wants to minimize appts. She does have CT angio abdomen and pelvis pending October 2025 and follow-up at [facility] October 2025 arranged. - Urinalysis, Routine w reflex microscopic; Future  2.  Left UPJ obstruction-stable.  No follow-ups on file.  Donnice Brooks, MD  [facility]  [address] [place], [place] [address] [phone]

## 2024-03-15 LAB — URINALYSIS, ROUTINE W REFLEX MICROSCOPIC
Bilirubin, UA: NEGATIVE
Glucose, UA: NEGATIVE
Ketones, UA: NEGATIVE
Nitrite, UA: NEGATIVE
Protein,UA: NEGATIVE
RBC, UA: NEGATIVE
Specific Gravity, UA: 1.01 (ref 1.005–1.030)
Urobilinogen, Ur: 0.2 mg/dL (ref 0.2–1.0)
pH, UA: 6 (ref 5.0–7.5)

## 2024-03-15 LAB — MICROSCOPIC EXAMINATION: Bacteria, UA: NONE SEEN

## 2024-03-17 DIAGNOSIS — F03B3 Unspecified dementia, moderate, with mood disturbance: Secondary | ICD-10-CM | POA: Diagnosis not present

## 2024-03-17 DIAGNOSIS — E870 Hyperosmolality and hypernatremia: Secondary | ICD-10-CM | POA: Diagnosis not present

## 2024-03-17 DIAGNOSIS — K5909 Other constipation: Secondary | ICD-10-CM | POA: Diagnosis not present

## 2024-03-17 DIAGNOSIS — E039 Hypothyroidism, unspecified: Secondary | ICD-10-CM | POA: Diagnosis not present

## 2024-03-17 DIAGNOSIS — I480 Paroxysmal atrial fibrillation: Secondary | ICD-10-CM | POA: Diagnosis not present

## 2024-03-17 DIAGNOSIS — Z8673 Personal history of transient ischemic attack (TIA), and cerebral infarction without residual deficits: Secondary | ICD-10-CM | POA: Diagnosis not present

## 2024-03-17 DIAGNOSIS — J4530 Mild persistent asthma, uncomplicated: Secondary | ICD-10-CM | POA: Diagnosis not present

## 2024-03-17 DIAGNOSIS — I10 Essential (primary) hypertension: Secondary | ICD-10-CM | POA: Diagnosis not present

## 2024-03-17 DIAGNOSIS — F331 Major depressive disorder, recurrent, moderate: Secondary | ICD-10-CM | POA: Diagnosis not present

## 2024-03-17 DIAGNOSIS — E782 Mixed hyperlipidemia: Secondary | ICD-10-CM | POA: Diagnosis not present

## 2024-03-17 DIAGNOSIS — K219 Gastro-esophageal reflux disease without esophagitis: Secondary | ICD-10-CM | POA: Diagnosis not present

## 2024-03-17 DIAGNOSIS — I495 Sick sinus syndrome: Secondary | ICD-10-CM | POA: Diagnosis not present

## 2024-03-23 ENCOUNTER — Ambulatory Visit (HOSPITAL_COMMUNITY)
Admission: RE | Admit: 2024-03-23 | Discharge: 2024-03-23 | Disposition: A | Discharge status: Home / Self Care | Source: Ambulatory Visit | Attending: Gastroenterology | Admitting: Gastroenterology

## 2024-03-23 DIAGNOSIS — E870 Hyperosmolality and hypernatremia: Secondary | ICD-10-CM | POA: Diagnosis not present

## 2024-03-23 DIAGNOSIS — I10 Essential (primary) hypertension: Secondary | ICD-10-CM | POA: Diagnosis not present

## 2024-03-23 DIAGNOSIS — K219 Gastro-esophageal reflux disease without esophagitis: Secondary | ICD-10-CM | POA: Diagnosis not present

## 2024-03-23 DIAGNOSIS — K838 Other specified diseases of biliary tract: Secondary | ICD-10-CM | POA: Diagnosis not present

## 2024-03-23 DIAGNOSIS — K7689 Other specified diseases of liver: Secondary | ICD-10-CM | POA: Diagnosis not present

## 2024-03-23 DIAGNOSIS — K5909 Other constipation: Secondary | ICD-10-CM | POA: Diagnosis not present

## 2024-03-23 DIAGNOSIS — K824 Cholesterolosis of gallbladder: Secondary | ICD-10-CM | POA: Diagnosis not present

## 2024-03-23 DIAGNOSIS — I480 Paroxysmal atrial fibrillation: Secondary | ICD-10-CM | POA: Diagnosis not present

## 2024-03-23 DIAGNOSIS — F331 Major depressive disorder, recurrent, moderate: Secondary | ICD-10-CM | POA: Diagnosis not present

## 2024-03-23 DIAGNOSIS — E039 Hypothyroidism, unspecified: Secondary | ICD-10-CM | POA: Diagnosis not present

## 2024-03-23 DIAGNOSIS — F03B3 Unspecified dementia, moderate, with mood disturbance: Secondary | ICD-10-CM | POA: Diagnosis not present

## 2024-03-23 DIAGNOSIS — R1084 Generalized abdominal pain: Secondary | ICD-10-CM | POA: Diagnosis not present

## 2024-03-23 DIAGNOSIS — K5730 Diverticulosis of large intestine without perforation or abscess without bleeding: Secondary | ICD-10-CM | POA: Diagnosis not present

## 2024-03-23 DIAGNOSIS — I495 Sick sinus syndrome: Secondary | ICD-10-CM | POA: Diagnosis not present

## 2024-03-23 DIAGNOSIS — E782 Mixed hyperlipidemia: Secondary | ICD-10-CM | POA: Diagnosis not present

## 2024-03-23 DIAGNOSIS — J4530 Mild persistent asthma, uncomplicated: Secondary | ICD-10-CM | POA: Diagnosis not present

## 2024-03-23 MED ORDER — IOHEXOL 350 MG/ML SOLN
100.0000 mL | Freq: Once | INTRAVENOUS | Status: AC | PRN
  Administered 2024-03-23: 100 mL via INTRAVENOUS

## 2024-03-24 ENCOUNTER — Telehealth: Payer: Self-pay

## 2024-03-24 DIAGNOSIS — F0150 Vascular dementia without behavioral disturbance: Secondary | ICD-10-CM

## 2024-03-25 ENCOUNTER — Telehealth: Payer: Self-pay

## 2024-03-25 DIAGNOSIS — F03B3 Unspecified dementia, moderate, with mood disturbance: Secondary | ICD-10-CM | POA: Diagnosis not present

## 2024-03-25 DIAGNOSIS — E782 Mixed hyperlipidemia: Secondary | ICD-10-CM | POA: Diagnosis not present

## 2024-03-25 DIAGNOSIS — F331 Major depressive disorder, recurrent, moderate: Secondary | ICD-10-CM | POA: Diagnosis not present

## 2024-03-25 NOTE — Progress Notes (Signed)
 Complex Care Management Note Care Guide Note  03/25/2024 Name: [name] MRN: [id number] DOB: 02-28-1936   Complex Care Management Outreach Attempts: An unsuccessful telephone outreach was attempted today to offer the patient information about available complex care management services.  Follow Up Plan:  Additional outreach attempts will be made to offer the patient complex care management information and services.   Encounter Outcome:  No Answer-No Voicemail  Leotis Rase [facility], [facility] Guide  Direct Dial: [phone]  Fax [phone]

## 2024-03-30 DIAGNOSIS — J4530 Mild persistent asthma, uncomplicated: Secondary | ICD-10-CM | POA: Diagnosis not present

## 2024-03-30 DIAGNOSIS — E782 Mixed hyperlipidemia: Secondary | ICD-10-CM | POA: Diagnosis not present

## 2024-03-30 DIAGNOSIS — E039 Hypothyroidism, unspecified: Secondary | ICD-10-CM | POA: Diagnosis not present

## 2024-03-30 DIAGNOSIS — F331 Major depressive disorder, recurrent, moderate: Secondary | ICD-10-CM | POA: Diagnosis not present

## 2024-03-30 DIAGNOSIS — I10 Essential (primary) hypertension: Secondary | ICD-10-CM | POA: Diagnosis not present

## 2024-03-30 DIAGNOSIS — E870 Hyperosmolality and hypernatremia: Secondary | ICD-10-CM | POA: Diagnosis not present

## 2024-03-30 DIAGNOSIS — K219 Gastro-esophageal reflux disease without esophagitis: Secondary | ICD-10-CM | POA: Diagnosis not present

## 2024-03-30 DIAGNOSIS — Z8673 Personal history of transient ischemic attack (TIA), and cerebral infarction without residual deficits: Secondary | ICD-10-CM | POA: Diagnosis not present

## 2024-03-30 DIAGNOSIS — F03B3 Unspecified dementia, moderate, with mood disturbance: Secondary | ICD-10-CM | POA: Diagnosis not present

## 2024-03-30 DIAGNOSIS — I480 Paroxysmal atrial fibrillation: Secondary | ICD-10-CM | POA: Diagnosis not present

## 2024-03-30 DIAGNOSIS — I495 Sick sinus syndrome: Secondary | ICD-10-CM | POA: Diagnosis not present

## 2024-03-30 DIAGNOSIS — Z23 Encounter for immunization: Secondary | ICD-10-CM | POA: Diagnosis not present

## 2024-03-30 DIAGNOSIS — K5909 Other constipation: Secondary | ICD-10-CM | POA: Diagnosis not present

## 2024-03-31 NOTE — Progress Notes (Signed)
 Complex Care Management Note  Care Guide Note 03/31/2024 Name: [name] MRN: [id number] DOB: 1936/02/04  [name] is a 88 y.o. year old female who sees [name], [name], MD for primary care. I reached out to [name] by phone today to offer complex care management services.  [name] was given information about Complex Care Management services today including:   The Complex Care Management services include support from the care team which includes your Nurse Care Manager, Clinical Social Worker, or Pharmacist.  The Complex Care Management team is here to help remove barriers to the health concerns and goals most important to you. Complex Care Management services are voluntary, and the patient may decline or stop services at any time by request to their care team member.   Complex Care Management Consent Status: Patient agreed to services and verbal consent obtained.   Follow up plan:  Telephone appointment with complex care management team member scheduled for:  04/26/24 @ 9 AM  Encounter Outcome:  Patient Scheduled  Leotis Rase [facility], [facility] Guide  Direct Dial: [phone]  Fax [phone]

## 2024-04-04 ENCOUNTER — Ambulatory Visit (HOSPITAL_COMMUNITY)
Admission: RE | Admit: 2024-04-04 | Discharge: 2024-04-04 | Disposition: A | Discharge status: Home / Self Care | Source: Ambulatory Visit | Attending: Nurse Practitioner | Admitting: Nurse Practitioner

## 2024-04-04 ENCOUNTER — Telehealth: Payer: Self-pay

## 2024-04-04 ENCOUNTER — Ambulatory Visit: Payer: Self-pay | Admitting: Gastroenterology

## 2024-04-04 ENCOUNTER — Other Ambulatory Visit (HOSPITAL_COMMUNITY): Payer: Self-pay | Admitting: Nurse Practitioner

## 2024-04-04 DIAGNOSIS — J441 Chronic obstructive pulmonary disease with (acute) exacerbation: Secondary | ICD-10-CM

## 2024-04-04 DIAGNOSIS — J984 Other disorders of lung: Secondary | ICD-10-CM | POA: Diagnosis not present

## 2024-04-04 DIAGNOSIS — Z79899 Other long term (current) drug therapy: Secondary | ICD-10-CM | POA: Diagnosis not present

## 2024-04-04 DIAGNOSIS — R0989 Other specified symptoms and signs involving the circulatory and respiratory systems: Secondary | ICD-10-CM | POA: Diagnosis not present

## 2024-04-04 DIAGNOSIS — Z20822 Contact with and (suspected) exposure to covid-19: Secondary | ICD-10-CM | POA: Diagnosis not present

## 2024-04-04 NOTE — Telephone Encounter (Signed)
 Pt had nurse from [employer] to call here for the results of CT scan and plan for the pt of care. Please advise of CT results

## 2024-04-04 NOTE — Telephone Encounter (Signed)
 Please see result note

## 2024-04-06 ENCOUNTER — Other Ambulatory Visit: Payer: Self-pay

## 2024-04-06 DIAGNOSIS — I495 Sick sinus syndrome: Secondary | ICD-10-CM | POA: Diagnosis not present

## 2024-04-06 DIAGNOSIS — K219 Gastro-esophageal reflux disease without esophagitis: Secondary | ICD-10-CM

## 2024-04-06 DIAGNOSIS — E039 Hypothyroidism, unspecified: Secondary | ICD-10-CM | POA: Diagnosis not present

## 2024-04-06 DIAGNOSIS — R194 Change in bowel habit: Secondary | ICD-10-CM

## 2024-04-06 DIAGNOSIS — J4530 Mild persistent asthma, uncomplicated: Secondary | ICD-10-CM | POA: Diagnosis not present

## 2024-04-06 DIAGNOSIS — R1084 Generalized abdominal pain: Secondary | ICD-10-CM

## 2024-04-06 DIAGNOSIS — I480 Paroxysmal atrial fibrillation: Secondary | ICD-10-CM | POA: Diagnosis not present

## 2024-04-06 DIAGNOSIS — E870 Hyperosmolality and hypernatremia: Secondary | ICD-10-CM | POA: Diagnosis not present

## 2024-04-06 DIAGNOSIS — F03B3 Unspecified dementia, moderate, with mood disturbance: Secondary | ICD-10-CM | POA: Diagnosis not present

## 2024-04-06 DIAGNOSIS — E782 Mixed hyperlipidemia: Secondary | ICD-10-CM | POA: Diagnosis not present

## 2024-04-06 DIAGNOSIS — K5909 Other constipation: Secondary | ICD-10-CM | POA: Diagnosis not present

## 2024-04-06 DIAGNOSIS — Z8673 Personal history of transient ischemic attack (TIA), and cerebral infarction without residual deficits: Secondary | ICD-10-CM | POA: Diagnosis not present

## 2024-04-06 DIAGNOSIS — I10 Essential (primary) hypertension: Secondary | ICD-10-CM | POA: Diagnosis not present

## 2024-04-06 DIAGNOSIS — F331 Major depressive disorder, recurrent, moderate: Secondary | ICD-10-CM | POA: Diagnosis not present

## 2024-04-08 DIAGNOSIS — J069 Acute upper respiratory infection, unspecified: Secondary | ICD-10-CM | POA: Diagnosis not present

## 2024-04-08 DIAGNOSIS — J4541 Moderate persistent asthma with (acute) exacerbation: Secondary | ICD-10-CM | POA: Diagnosis not present

## 2024-04-12 ENCOUNTER — Telehealth: Payer: Self-pay | Admitting: *Deleted

## 2024-04-12 DIAGNOSIS — J069 Acute upper respiratory infection, unspecified: Secondary | ICD-10-CM | POA: Diagnosis not present

## 2024-04-12 DIAGNOSIS — T8543XA Leakage of breast prosthesis and implant, initial encounter: Secondary | ICD-10-CM | POA: Diagnosis not present

## 2024-04-12 DIAGNOSIS — N644 Mastodynia: Secondary | ICD-10-CM | POA: Diagnosis not present

## 2024-04-12 NOTE — Telephone Encounter (Signed)
 Nurse Jenna) from [facility] called about pt's CT results and lab work. She states pt has dementia and no one to take care of her. Informed her that it looks like someone did speak to her about her results of the CT scan and lab requisitions were mailed to pt. She stated she would call pt today and inform her again that someone spoke to her and tell her to wait for lab requisitions to come in the mail and then go have labs done. She stated that pt has been sick for over a week with a cold and forgetting to take her antibiotics.

## 2024-04-13 ENCOUNTER — Other Ambulatory Visit (HOSPITAL_COMMUNITY): Payer: Self-pay | Admitting: Family Medicine

## 2024-04-13 DIAGNOSIS — R0789 Other chest pain: Secondary | ICD-10-CM | POA: Diagnosis not present

## 2024-04-13 DIAGNOSIS — T8543XA Leakage of breast prosthesis and implant, initial encounter: Secondary | ICD-10-CM

## 2024-04-13 DIAGNOSIS — R197 Diarrhea, unspecified: Secondary | ICD-10-CM | POA: Diagnosis not present

## 2024-04-13 DIAGNOSIS — J069 Acute upper respiratory infection, unspecified: Secondary | ICD-10-CM | POA: Diagnosis not present

## 2024-04-15 DIAGNOSIS — J4530 Mild persistent asthma, uncomplicated: Secondary | ICD-10-CM | POA: Diagnosis not present

## 2024-04-15 DIAGNOSIS — F03B3 Unspecified dementia, moderate, with mood disturbance: Secondary | ICD-10-CM | POA: Diagnosis not present

## 2024-04-15 DIAGNOSIS — I480 Paroxysmal atrial fibrillation: Secondary | ICD-10-CM | POA: Diagnosis not present

## 2024-04-15 DIAGNOSIS — F331 Major depressive disorder, recurrent, moderate: Secondary | ICD-10-CM | POA: Diagnosis not present

## 2024-04-15 DIAGNOSIS — E870 Hyperosmolality and hypernatremia: Secondary | ICD-10-CM | POA: Diagnosis not present

## 2024-04-15 DIAGNOSIS — I495 Sick sinus syndrome: Secondary | ICD-10-CM | POA: Diagnosis not present

## 2024-04-15 DIAGNOSIS — E782 Mixed hyperlipidemia: Secondary | ICD-10-CM | POA: Diagnosis not present

## 2024-04-15 DIAGNOSIS — I10 Essential (primary) hypertension: Secondary | ICD-10-CM | POA: Diagnosis not present

## 2024-04-15 DIAGNOSIS — K219 Gastro-esophageal reflux disease without esophagitis: Secondary | ICD-10-CM | POA: Diagnosis not present

## 2024-04-15 DIAGNOSIS — Z8673 Personal history of transient ischemic attack (TIA), and cerebral infarction without residual deficits: Secondary | ICD-10-CM | POA: Diagnosis not present

## 2024-04-15 DIAGNOSIS — R0789 Other chest pain: Secondary | ICD-10-CM | POA: Diagnosis not present

## 2024-04-15 DIAGNOSIS — E039 Hypothyroidism, unspecified: Secondary | ICD-10-CM | POA: Diagnosis not present

## 2024-04-19 ENCOUNTER — Ambulatory Visit (HOSPITAL_COMMUNITY)
Admission: RE | Admit: 2024-04-19 | Discharge: 2024-04-19 | Disposition: A | Discharge status: Home / Self Care | Source: Ambulatory Visit | Attending: Family Medicine | Admitting: Family Medicine

## 2024-04-19 ENCOUNTER — Other Ambulatory Visit (HOSPITAL_COMMUNITY): Payer: Self-pay | Admitting: Family Medicine

## 2024-04-19 ENCOUNTER — Encounter: Payer: Self-pay | Admitting: Gastroenterology

## 2024-04-19 DIAGNOSIS — T8543XA Leakage of breast prosthesis and implant, initial encounter: Secondary | ICD-10-CM | POA: Diagnosis not present

## 2024-04-19 DIAGNOSIS — J4530 Mild persistent asthma, uncomplicated: Secondary | ICD-10-CM | POA: Diagnosis not present

## 2024-04-19 DIAGNOSIS — E039 Hypothyroidism, unspecified: Secondary | ICD-10-CM | POA: Diagnosis not present

## 2024-04-19 DIAGNOSIS — Z8673 Personal history of transient ischemic attack (TIA), and cerebral infarction without residual deficits: Secondary | ICD-10-CM | POA: Diagnosis not present

## 2024-04-19 DIAGNOSIS — E870 Hyperosmolality and hypernatremia: Secondary | ICD-10-CM | POA: Diagnosis not present

## 2024-04-19 DIAGNOSIS — I480 Paroxysmal atrial fibrillation: Secondary | ICD-10-CM | POA: Diagnosis not present

## 2024-04-19 DIAGNOSIS — E782 Mixed hyperlipidemia: Secondary | ICD-10-CM | POA: Diagnosis not present

## 2024-04-19 DIAGNOSIS — K219 Gastro-esophageal reflux disease without esophagitis: Secondary | ICD-10-CM | POA: Diagnosis not present

## 2024-04-19 DIAGNOSIS — Z9882 Breast implant status: Secondary | ICD-10-CM | POA: Diagnosis not present

## 2024-04-19 DIAGNOSIS — R0789 Other chest pain: Secondary | ICD-10-CM | POA: Diagnosis not present

## 2024-04-19 DIAGNOSIS — I10 Essential (primary) hypertension: Secondary | ICD-10-CM | POA: Diagnosis not present

## 2024-04-19 DIAGNOSIS — F331 Major depressive disorder, recurrent, moderate: Secondary | ICD-10-CM | POA: Diagnosis not present

## 2024-04-19 DIAGNOSIS — F03B3 Unspecified dementia, moderate, with mood disturbance: Secondary | ICD-10-CM | POA: Diagnosis not present

## 2024-04-19 DIAGNOSIS — I495 Sick sinus syndrome: Secondary | ICD-10-CM | POA: Diagnosis not present

## 2024-04-19 NOTE — Progress Notes (Signed)
 Patient was monitored by this RN during MRI scan due to presence of a pacemaker. Cardiac rhythm was continuously monitored throughout the procedure. Prior to the start of the scan, the pacemaker was placed in MRI-safe mode by the MRI technician and/or pacemaker representative. Following the completion of the scan, the device was returned to its pre-MRI settings. Neurological status and orientation post-procedure were unchanged from baseline.   Pre-procedure Heart Rate (Prior to being placed in MRI safe mode):65 Post-procedure Heart Rate (Once pacemaker is returned to baseline mode): 78

## 2024-04-24 DIAGNOSIS — F03B3 Unspecified dementia, moderate, with mood disturbance: Secondary | ICD-10-CM | POA: Diagnosis not present

## 2024-04-24 DIAGNOSIS — I5032 Chronic diastolic (congestive) heart failure: Secondary | ICD-10-CM | POA: Diagnosis not present

## 2024-04-24 DIAGNOSIS — F331 Major depressive disorder, recurrent, moderate: Secondary | ICD-10-CM | POA: Diagnosis not present

## 2024-04-25 DIAGNOSIS — I480 Paroxysmal atrial fibrillation: Secondary | ICD-10-CM | POA: Diagnosis not present

## 2024-04-25 DIAGNOSIS — K219 Gastro-esophageal reflux disease without esophagitis: Secondary | ICD-10-CM | POA: Diagnosis not present

## 2024-04-25 DIAGNOSIS — E039 Hypothyroidism, unspecified: Secondary | ICD-10-CM | POA: Diagnosis not present

## 2024-04-25 DIAGNOSIS — I10 Essential (primary) hypertension: Secondary | ICD-10-CM | POA: Diagnosis not present

## 2024-04-25 DIAGNOSIS — E782 Mixed hyperlipidemia: Secondary | ICD-10-CM | POA: Diagnosis not present

## 2024-04-25 DIAGNOSIS — E870 Hyperosmolality and hypernatremia: Secondary | ICD-10-CM | POA: Diagnosis not present

## 2024-04-25 DIAGNOSIS — F03B3 Unspecified dementia, moderate, with mood disturbance: Secondary | ICD-10-CM | POA: Diagnosis not present

## 2024-04-25 DIAGNOSIS — F331 Major depressive disorder, recurrent, moderate: Secondary | ICD-10-CM | POA: Diagnosis not present

## 2024-04-25 DIAGNOSIS — R0789 Other chest pain: Secondary | ICD-10-CM | POA: Diagnosis not present

## 2024-04-25 DIAGNOSIS — I495 Sick sinus syndrome: Secondary | ICD-10-CM | POA: Diagnosis not present

## 2024-04-25 DIAGNOSIS — J4530 Mild persistent asthma, uncomplicated: Secondary | ICD-10-CM | POA: Diagnosis not present

## 2024-04-25 DIAGNOSIS — Z8673 Personal history of transient ischemic attack (TIA), and cerebral infarction without residual deficits: Secondary | ICD-10-CM | POA: Diagnosis not present

## 2024-04-26 ENCOUNTER — Other Ambulatory Visit: Payer: Self-pay

## 2024-04-26 NOTE — Patient Instructions (Signed)
 Visit Information  Thank you for taking time to visit with me today. Please don't hesitate to contact me if I can be of assistance to you before our next scheduled appointment.  Your next care management appointment is by telephone on 12/16 at 2025    Please call the care guide team at [phone] if you need to cancel, schedule, or reschedule an appointment.   Please call 911 if you are experiencing a Mental Health or Behavioral Health Crisis or need someone to talk to.  Olam Ally, MSW, LCSW [place]  [facility], [facility] Licensed Clinical Social Worker Direct Dial: [phone]

## 2024-04-26 NOTE — Patient Outreach (Signed)
 Complex Care Management   Visit Note  04/26/2024  Name:  [name] MRN: [id number] DOB: 1935/08/16  Situation: Referral received for Complex Care Management related to Dementia I obtained verbal consent from Patient.  Visit completed with Patient  and Patient's brother Garrel on the phone. LCSW inquired with patient if she wanted to executed the [employer] benefit through her insurance. Patient requested that the information be sent again to both of their emails. Patient appeared to in agreement with initiating the [facility] services once information is received. Background:   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48   age 22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 2 weeks   Hemorrhoids 2007   History of kidney stones    HTN (hypertension)    Hyperplastic colon polyp 2005   Hypothyroidism    Narcolepsy    Pneumonia    PONV (postoperative nausea and vomiting)    Presence of permanent cardiac pacemaker    TIA (transient ischemic attack)     Assessment: Patient Reported Symptoms:  Cognitive Cognitive Status: Normal speech and language skills, Requires Assistance Decision Making Cognitive/Intellectual Conditions Management [RPT]: None reported or documented in medical history or problem list      Neurological Neurological Review of Symptoms: No symptoms reported    HEENT HEENT Symptoms Reported: Other: (Patient reports wearing hearing aides) HEENT Management Strategies: Medical device    Cardiovascular Cardiovascular Symptoms Reported: No symptoms reported Other Cardiovascular Symptoms: Patient reports  having a pace maker Does patient have uncontrolled Hypertension?: Yes Cardiovascular Management Strategies: Adequate rest  Respiratory Respiratory Symptoms Reported: No symptoms reported Other Respiratory Symptoms: Patient reports having asthma Additional Respiratory Details: Patient reports using inhlares Respiratory Management Strategies: Medication therapy  Endocrine Endocrine Symptoms Reported: No symptoms reported Is patient diabetic?: Yes    Gastrointestinal Gastrointestinal Symptoms Reported: Abdominal pain or discomfort Additional Gastrointestinal Details: Patient reports pain and has had a MRI and awaiting results Gastrointestinal Management Strategies: Adequate rest    Genitourinary Genitourinary Symptoms Reported: No symptoms reported    Integumentary Integumentary Symptoms Reported: No symptoms reported    Musculoskeletal Musculoskelatal Symptoms Reviewed: Joint pain, Difficulty walking Additional Musculoskeletal Details: Patient reports using a cane Musculoskeletal Management Strategies: Adequate rest Falls in the past year?: No Number of falls in past year: 1 or less Was there an injury with Fall?: No Fall Risk Category Calculator: 0 Patient Fall Risk Level: Low Fall Risk Patient at Risk for Falls Due to: No Fall Risks Fall risk Follow up: Falls evaluation completed  Psychosocial Psychosocial Symptoms Reported: No symptoms reported Additional Psychological Details: Patient reports no symptoms     Quality of Family Relationships: supportive Do you feel physically threatened by others?: No    04/26/2024    PHQ2-9 Depression Screening   Little interest or pleasure in doing things Not at all  Feeling down, depressed, or hopeless Not at all  PHQ-2 - Total Score 0  Trouble falling or staying asleep, or sleeping too  much    Feeling tired or having little energy    Poor appetite or overeating     Feeling bad about yourself - or that you are a failure or have let  yourself or your family down    Trouble concentrating on things, such as reading the newspaper or watching television    Moving or speaking so slowly that other people could have noticed.  Or the opposite - being so fidgety or restless that you have been moving around a lot more than usual    Thoughts that you would be better off dead, or hurting yourself in some way    PHQ2-9 Total Score    If you checked off any problems, how difficult have these problems made it for you to do your work, take care of things at home, or get along with other people    Depression Interventions/Treatment      There were no vitals filed for this visit.    Medications Reviewed Today     Reviewed by Sherren Olam FORBES KEN (Social Worker) on 04/26/24 at [phone]  Med List Status: <None>   Medication Order Taking? Sig Documenting Provider Last Dose Status Informant  acetaminophen  (TYLENOL ) 650 MG CR tablet [id number]  Take 650 mg by mouth every 8 (eight) hours as needed for pain. [provider]  Active Self  albuterol  (VENTOLIN  HFA) 108 (90 Base) MCG/ACT inhaler [id number]  Inhale 2 puffs into the lungs every 6 (six) hours as needed for wheezing or shortness of breath. [name], NP  Active Self  amLODipine  (NORVASC ) 10 MG tablet [id number]  Take 10 mg by mouth daily. [provider]  Active   apixaban  (ELIQUIS ) 2.5 MG TABS tablet [id number]  Take 1 tablet (2.5 mg total) by mouth 2 (two) times daily. [name], NP  Active Self           Med Note [facility], [name]   Wed Jul 08, 2023 10:21 AM) [name] with pharmacy removed Eliquis  from pack (prefilled at [facility]) for 2/10& 2/11. Per [name], she did not have instructions for today to hold for 2/12 spoke to [place]. Patient likely had Eliquis  today 2/12 as Pharmacy [facility]) states they did not have instructions to remove from prefilled pack.   ezetimibe  (ZETIA ) 10 MG tablet [id number]  Take 1 tablet (10 mg total) by mouth daily. [name], NP  Active Self  furosemide  (LASIX ) 20 MG tablet [id number]  Take 3 tablets (60 mg total) by mouth daily. [name], NP  Active Self  levothyroxine  (SYNTHROID ) 50 MCG tablet [id number]  Take 50 mcg by mouth daily before breakfast. [provider]  Active Self  memantine  (NAMENDA ) 10 MG tablet [id number]  Take 1 tablet (10 mg total) by mouth 2 (two) times daily. [name], NP  Active Self  metoprolol  tartrate (LOPRESSOR ) 50 MG tablet [id number]  Take 1 tablet (50 mg total) by mouth 2 (two) times daily.  Patient taking differently: Take 25 mg by mouth 2 (two) times daily.   Waddell Danelle ORN, MD  Active   oxybutynin  (DITROPAN ) 5 MG tablet [id number]  Take 1 tablet (5 mg total) by mouth every 8 (eight) hours as needed for bladder spasms.  Patient not taking: Reported on 03/14/2024   Winter, Christopher Aaron, MD  Active   pantoprazole  (PROTONIX ) 40 MG tablet [id number]  Take 1 tablet (40 mg total) by mouth daily. [name], NP  Active Self  phenazopyridine  (PYRIDIUM )  200 MG tablet [id number]  Take 1 tablet (200 mg total) by mouth 3 (three) times daily as needed (for pain with urination). Devere Lonni Righter, MD  Active   Potassium Chloride  ER 20 MEQ TBCR [id number]  Take 1 tablet (20 mEq total) by mouth daily. 1 tab daily by mouth--- take while taking Lasix /furosemide  [name], NP  Active Self            Recommendation:   Continue Current Plan of Care  Follow Up Plan:   Telephone follow-up 05/10/2024 at 10:00 am.  Olam Ally, MSW, LCSW [place]  [facility], [facility] Licensed Clinical Social Worker Direct Dial: [phone]

## 2024-04-28 DIAGNOSIS — R062 Wheezing: Secondary | ICD-10-CM | POA: Diagnosis not present

## 2024-04-28 DIAGNOSIS — R051 Acute cough: Secondary | ICD-10-CM | POA: Diagnosis not present

## 2024-04-28 DIAGNOSIS — J441 Chronic obstructive pulmonary disease with (acute) exacerbation: Secondary | ICD-10-CM | POA: Diagnosis not present

## 2024-04-29 ENCOUNTER — Ambulatory Visit: Payer: PPO

## 2024-04-29 DIAGNOSIS — I480 Paroxysmal atrial fibrillation: Secondary | ICD-10-CM

## 2024-05-02 LAB — CUP PACEART REMOTE DEVICE CHECK
Battery Remaining Longevity: 154 mo
Battery Voltage: 3.07 V
Brady Statistic AP VP Percent: 0.02 %
Brady Statistic AP VS Percent: 58.5 %
Brady Statistic AS VP Percent: 0.01 %
Brady Statistic AS VS Percent: 41.47 %
Brady Statistic RA Percent Paced: 58.44 %
Brady Statistic RV Percent Paced: 0.04 %
Date Time Interrogation Session: 20251204202057
Implantable Lead Connection Status: 753985
Implantable Lead Connection Status: 753985
Implantable Lead Implant Date: 20240904
Implantable Lead Implant Date: 20240904
Implantable Lead Location: 753859
Implantable Lead Location: 753860
Implantable Lead Model: 3830
Implantable Lead Model: 5076
Implantable Pulse Generator Implant Date: 20240904
Lead Channel Impedance Value: 304 Ohm
Lead Channel Impedance Value: 342 Ohm
Lead Channel Impedance Value: 342 Ohm
Lead Channel Impedance Value: 494 Ohm
Lead Channel Pacing Threshold Amplitude: 0.625 V
Lead Channel Pacing Threshold Amplitude: 1.125 V
Lead Channel Pacing Threshold Pulse Width: 0.4 ms
Lead Channel Pacing Threshold Pulse Width: 0.4 ms
Lead Channel Sensing Intrinsic Amplitude: 1.875 mV
Lead Channel Sensing Intrinsic Amplitude: 1.875 mV
Lead Channel Sensing Intrinsic Amplitude: 25.875 mV
Lead Channel Sensing Intrinsic Amplitude: 25.875 mV
Lead Channel Setting Pacing Amplitude: 1.5 V
Lead Channel Setting Pacing Amplitude: 2.25 V
Lead Channel Setting Pacing Pulse Width: 0.4 ms
Lead Channel Setting Sensing Sensitivity: 1.2 mV
Zone Setting Status: 755011

## 2024-05-03 NOTE — Progress Notes (Signed)
 Remote PPM Transmission

## 2024-05-04 ENCOUNTER — Ambulatory Visit: Payer: Self-pay | Admitting: Internal Medicine

## 2024-05-04 DIAGNOSIS — E039 Hypothyroidism, unspecified: Secondary | ICD-10-CM | POA: Diagnosis not present

## 2024-05-04 DIAGNOSIS — E782 Mixed hyperlipidemia: Secondary | ICD-10-CM | POA: Diagnosis not present

## 2024-05-06 DIAGNOSIS — K219 Gastro-esophageal reflux disease without esophagitis: Secondary | ICD-10-CM | POA: Diagnosis not present

## 2024-05-06 DIAGNOSIS — I495 Sick sinus syndrome: Secondary | ICD-10-CM | POA: Diagnosis not present

## 2024-05-06 DIAGNOSIS — I10 Essential (primary) hypertension: Secondary | ICD-10-CM | POA: Diagnosis not present

## 2024-05-06 DIAGNOSIS — E870 Hyperosmolality and hypernatremia: Secondary | ICD-10-CM | POA: Diagnosis not present

## 2024-05-06 DIAGNOSIS — E039 Hypothyroidism, unspecified: Secondary | ICD-10-CM | POA: Diagnosis not present

## 2024-05-06 DIAGNOSIS — F03B3 Unspecified dementia, moderate, with mood disturbance: Secondary | ICD-10-CM | POA: Diagnosis not present

## 2024-05-06 DIAGNOSIS — I480 Paroxysmal atrial fibrillation: Secondary | ICD-10-CM | POA: Diagnosis not present

## 2024-05-06 DIAGNOSIS — F331 Major depressive disorder, recurrent, moderate: Secondary | ICD-10-CM | POA: Diagnosis not present

## 2024-05-06 DIAGNOSIS — E782 Mixed hyperlipidemia: Secondary | ICD-10-CM | POA: Diagnosis not present

## 2024-05-06 DIAGNOSIS — J4530 Mild persistent asthma, uncomplicated: Secondary | ICD-10-CM | POA: Diagnosis not present

## 2024-05-06 DIAGNOSIS — Z8673 Personal history of transient ischemic attack (TIA), and cerebral infarction without residual deficits: Secondary | ICD-10-CM | POA: Diagnosis not present

## 2024-05-06 DIAGNOSIS — R0789 Other chest pain: Secondary | ICD-10-CM | POA: Diagnosis not present

## 2024-05-10 ENCOUNTER — Telehealth

## 2024-05-10 ENCOUNTER — Telehealth: Payer: Self-pay

## 2024-05-10 NOTE — Telephone Encounter (Signed)
 Pt had labs done at her PCP office on Dec 10th and [facility] phoned regarding  these labs from her PCP office is good enough for us  or does the pt need to have more labs done.  I tried calling and no one ever answered the phone [phone]). I didn't see in her chart where labs were done

## 2024-05-13 ENCOUNTER — Encounter: Payer: Self-pay | Admitting: Internal Medicine

## 2024-05-17 ENCOUNTER — Telehealth: Admitting: Licensed Clinical Social Worker

## 2024-05-17 NOTE — Patient Outreach (Signed)
 Social Drivers of Health  Community Resource and Care Coordination Visit Note   05/17/2024  Name: [name] MRN: [id number] DOB:1936-04-30  Situation: Referral received for [facility] needs assessment and assistance related to [employer]. I obtained verbal consent from Caregiver Patient.  Visit completed with Patient on the phone.   Background:   SDOH Interventions Today    Flowsheet Row Most Recent Value  SDOH Interventions   Food Insecurity Interventions Intervention Not Indicated  Housing Interventions Intervention Not Indicated  Transportation Interventions FindHelp Community Resource Referral  [Family applied for [facility]]  Utilities Interventions Intervention Not Indicated     Assessment:   Goals Addressed   None     Recommendation:   attend all scheduled provider appointments [facility] PALs follow up by the family (patients brother) is going well , he has applied for the program for the patient and she is eligible. 30 hours per month for transportation will be covered by [employer]. On other SDOH needs at this time. Scheduled follow up with LCSW on 05/23/2024 at 10:00 am SW had a 3-way call with patient and her brother [name]  Follow Up Plan:   Telephone follow up appointment date/time:  05/23/2024 at 10:00 am with L. Serina Tobias CHARM Maranda HEDWIG, PhD [facility], [facility] Social Worker Direct Dial: [phone]  Fax: [phone]

## 2024-05-17 NOTE — Patient Instructions (Signed)
 Visit Information  Thank you for taking time to visit with me today. Please don't hesitate to contact me if I can be of assistance to you before our next scheduled appointment.  Your next care management appointment is by telephone on 05/23/2024 at 10:00 am with LCSW L. Braken    Please call the care guide team at [phone] if you need to cancel, schedule, or reschedule an appointment.   Please call the Suicide and Crisis Lifeline: 988 call the [facility]: [phone] call 911 if you are experiencing a Mental Health or Behavioral Health Crisis or need someone to talk to.  Cynthia CHARM Maranda HEDWIG, PhD [facility], [facility] Social Worker Direct Dial: [phone]  Fax: [phone]

## 2024-05-23 ENCOUNTER — Other Ambulatory Visit: Payer: Self-pay

## 2024-05-23 NOTE — Patient Instructions (Signed)
 Visit Information  Thank you for taking time to visit with me today. Please don't hesitate to contact me if I can be of assistance to you before our next scheduled appointment.  Your next care management appointment is no further scheduled appointments.    Patient has met all care management goals. Care Management case will be closed. Patient has been provided contact information should new needs arise.   Please call the care guide team at [phone] if you need to cancel, schedule, or reschedule an appointment.   Please call 911 if you are experiencing a Mental Health or Behavioral Health Crisis or need someone to talk to.  Georgine Kitchens, MSW, LCSW [place]  [facility], [facility] Licensed Clinical Social Worker Direct Dial: [phone]

## 2024-05-23 NOTE — Patient Outreach (Signed)
 Complex Care Management   Visit Note  05/23/2024  Name:  [name] MRN: [id number] DOB: August 12, 1935  Situation: Referral received for Complex Care Management related to Dementia I obtained verbal consent from Patient.  Visit completed with Patient  and Patient's brother [name] on the phone. LCSW was informed that pt's brother [name] has enrolled pt in the [place] [facility] services. It was agreed that since pt has met her goal that services would be closed. LCSW provided pt and pt's brother with LCSW's contact information if further needs arise. Background:   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66   age 36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 2 weeks   Hemorrhoids 2007   History of kidney stones    HTN (hypertension)    Hyperplastic colon polyp 2005   Hypothyroidism    Narcolepsy    Pneumonia    PONV (postoperative nausea and vomiting)    Presence of permanent cardiac pacemaker    TIA (transient ischemic attack)     Assessment: Patient Reported Symptoms:  Cognitive Cognitive Status: Requires Assistance Decision Making, Normal speech and language skills Cognitive/Intellectual Conditions Management [RPT]: None reported or documented in medical history or problem list      Neurological Neurological Review of Symptoms: Not assessed    HEENT HEENT Symptoms Reported: Not assessed      Cardiovascular Cardiovascular Symptoms Reported: Not assessed    Respiratory Respiratory Symptoms Reported: Not assesed    Endocrine Endocrine Symptoms Reported: Not assessed    Gastrointestinal Gastrointestinal Symptoms  Reported: No symptoms reported Additional Gastrointestinal Details: MRI did not show any concens      Genitourinary Genitourinary Symptoms Reported: Not assessed    Integumentary Integumentary Symptoms Reported: Not assessed    Musculoskeletal Musculoskelatal Symptoms Reviewed: Not assessed        Psychosocial Psychosocial Symptoms Reported: No symptoms reported          05/23/2024    PHQ2-9 Depression Screening   Little interest or pleasure in doing things    Feeling down, depressed, or hopeless    PHQ-2 - Total Score    Trouble falling or staying asleep, or sleeping too much    Feeling tired or having little energy    Poor appetite or overeating     Feeling bad about yourself - or that you are a failure or have let yourself or your family down    Trouble concentrating on things, such as reading the newspaper or watching television    Moving or speaking so slowly that other people could have noticed.  Or the opposite - being so fidgety or restless that you have been moving around a lot more than usual    Thoughts that you would be better off dead, or hurting yourself in some way    PHQ2-9 Total Score    If you checked off any problems, how difficult have these problems made it for you to do your work, take care of things at  home, or get along with other people    Depression Interventions/Treatment      There were no vitals filed for this visit.    Medications Reviewed Today     Reviewed by Sherren Olam FORBES KEN (Social Worker) on 05/23/24 at 1004  Med List Status: <None>   Medication Order Taking? Sig Documenting Provider Last Dose Status Informant  acetaminophen  (TYLENOL ) 650 MG CR tablet [id number]  Take 650 mg by mouth every 8 (eight) hours as needed for pain. [provider]  Active Self  albuterol  (VENTOLIN  HFA) 108 (90 Base) MCG/ACT inhaler [id number]  Inhale 2 puffs into the lungs every 6 (six) hours as needed for wheezing or shortness of breath. [name], NP  Active Self  amLODipine  (NORVASC ) 10 MG tablet [id number]  Take 10 mg by mouth daily. [provider]  Active   apixaban  (ELIQUIS ) 2.5 MG TABS tablet [id number]  Take 1 tablet (2.5 mg total) by mouth 2 (two) times daily. [name], NP  Active Self           Med Note [facility], [name]   Wed Jul 08, 2023 10:21 AM) [name] with pharmacy removed Eliquis  from pack (prefilled at [facility]) for 2/10& 2/11. Per [name], she did not have instructions for today to hold for 2/12 spoke to [place]. Patient likely had Eliquis  today 2/12 as Pharmacy [facility]) states they did not have instructions to remove from prefilled pack.   ezetimibe  (ZETIA ) 10 MG tablet [id number]  Take 1 tablet (10 mg total) by mouth daily. [name], NP  Active Self  furosemide  (LASIX ) 20 MG tablet [id number]  Take 3 tablets (60 mg total) by mouth daily. [name], NP  Active Self  levothyroxine  (SYNTHROID ) 50 MCG tablet [id number]  Take 50 mcg by mouth daily before breakfast. [provider]  Active Self  memantine  (NAMENDA ) 10 MG tablet [id number]  Take 1 tablet (10 mg total) by mouth 2 (two) times daily. [name], NP  Active Self  metoprolol  tartrate (LOPRESSOR ) 50 MG tablet [id number]  Take 1 tablet (50 mg total) by mouth 2 (two) times daily.  Patient taking differently: Take 25 mg by mouth 2 (two) times daily.   Waddell Danelle ORN, MD  Active   oxybutynin  (DITROPAN ) 5 MG tablet [id number]  Take 1 tablet (5 mg total) by mouth every 8 (eight) hours as needed for bladder spasms.  Patient not taking: Reported on 03/14/2024   Winter, Christopher Aaron, MD  Active   pantoprazole  (PROTONIX ) 40 MG tablet [id number]  Take 1 tablet (40 mg total) by mouth daily. [name], NP  Active Self  phenazopyridine  (PYRIDIUM ) 200 MG tablet [id number]  Take 1 tablet (200 mg total) by mouth 3 (three) times daily as needed (for pain with urination). Devere Lonni Righter, MD  Active   Potassium  Chloride ER 20 MEQ TBCR [id number]  Take 1 tablet (20 mEq total) by mouth daily. 1 tab daily by mouth--- take while taking Lasix /furosemide  [name], NP  Active Self            Recommendation:   To utilize [facility] services when needed  Follow Up Plan:   Patient has met all care management goals. Care Management case will be closed. Patient has been provided contact information should new needs arise.   Olam Sherren, MSW, LCSW [place]  [facility], [facility] Licensed Clinical Social Worker Direct Dial: [phone]

## 2024-06-06 ENCOUNTER — Telehealth: Payer: Self-pay

## 2024-06-06 NOTE — Telephone Encounter (Signed)
 T's brother Garrel Mar phoned wanting to know what the next steps are in the pt's treatment. The pt's brother would like to speak with you. Please advise.

## 2024-06-08 ENCOUNTER — Telehealth (HOSPITAL_COMMUNITY): Payer: Self-pay | Admitting: Pharmacist

## 2024-06-08 NOTE — Telephone Encounter (Signed)
 CMP on 05/04/2024, calcium  wnl  Sent to media tab

## 2024-06-08 NOTE — Telephone Encounter (Signed)
 Received Prolia  orders via fax from Dr. Milford office however order is not signed. Left VM with Dr. Milford nurse/assistant to refax order with signature  Patient's next dose is scheduled for 07/25/2024 @ [facility] infusion  [name], PharmD, MPH, BCPS, CPP Clinical Pharmacist

## 2024-06-10 NOTE — Telephone Encounter (Signed)
 Reached out to Dr. Milford again for updated orders for Prolia .  Sherry Pennant, PharmD, MPH, BCPS, CPP Clinical Pharmacist

## 2024-06-16 NOTE — Telephone Encounter (Signed)
 Brooke from [employer] phoned inquiring of the pt's next steps in the pt's treatment. Pt has had labs and they are needing to know from you the pt's next steps in her treatment. Lyle asks if she can speak with you. Her number is [phone]. Also the states the pt's brother (Mike----POA) @ [phone] wants this information as well. Please advise. Pt was suppose to return in 3 months when she was last here in 02/16/2024

## 2024-06-17 NOTE — Telephone Encounter (Signed)
 Addressed under Jan 2026 calls.

## 2024-06-17 NOTE — Telephone Encounter (Signed)
 Labs completed 04/2024 with PCP and are scanned into media.   White blood cell count 10.1, hemoglobin 14.5, platelets 340.  Creatinine 1.54, GFR 33, BUN 22, potassium 4.1, total bilirubin 0.4, alkaline phosphatase 78, AST 18, and ALT 13.  She was last seen in September 2025 for chronic abdominal pain, bloating, and a CT angiogram was ordered that did not show any concerns for chronic mesenteric ischemia.  However the liver did appear somewhat nodular in the common bile duct was dilated more so than prior ultrasound in 2024.  It is not actually measured on the CT.  She does have known gallbladder sludge and polyps.  Prior HIDA scan in September 2024 for similar symptoms with patent duct and no concerns for dyskinesia or obstruction.  Ultrasound in 2024 also with a small gallstone and a 9 mm gallbladder wall polyp and mild gallbladder wall thickening.    Due to slightly increased, bile duct seen on the CT angiogram, the labs were completed as noted above through primary care.  She has no signs of obstruction, her LFTs are completely normal.  Her hemoglobin is normal.  At this point, I would I would recommend noninvasive evaluation to include updating an ultrasound and we can order this as a complete abdominal ultrasound.  She could certainly have biliary etiology going on at this time, but she would not be a good surgical candidate and I would recommend conservative interventions.  I would avoid an MRI as likely the images would not be ideal as she would have to remain still and she also has a pacemaker and it is unsure if this is compatible with MRI or not.  I spoke with Spurgeon Reid, pharmacist today with [facility].  I discussed the plan with him.  I have called patient's power of attorney, Garrel Mar, and I had to leave a message.  I was unable to speak to him directly.  I have left him my personal cell phone number and will discuss with him when he returns call.  At this point, I recommend  updating an ultrasound, and we can see her back in clinic after that if needed.  I would not suggest doing an extensive evaluation for any liver disease at this point due to her age.  She does not have any signs of portal hypertension and could just be dealing with more so fatty deposition    Mindy/Tammy: please arrange US  abdomen complete for patient. Thank you!

## 2024-06-17 NOTE — Telephone Encounter (Signed)
 noted

## 2024-06-21 NOTE — Telephone Encounter (Signed)
 Per Therisa, will wait before scheduling US  until she speaks with son

## 2024-06-22 NOTE — Telephone Encounter (Signed)
 Phoned and LMOVM of the pt's POA to look at the pt's MyChart at the message that was sent to the pt on 1/27/20236.

## 2024-06-24 NOTE — Telephone Encounter (Signed)
 Phoned and LMOVM for Garrel [facility]) to return call. Also checked with provider Therisa Stager to see if she has spoken with the POA and she has not. MyChart message has not been read either

## 2024-06-28 NOTE — Telephone Encounter (Signed)
 [name], I have tried to call the pt's POA and we have sent MyChart messages. Can I put this in letter form and mail this out to the pt that way she can also have it for her other providers if needed. Please advise

## 2024-07-12 ENCOUNTER — Ambulatory Visit: Admitting: Neurology

## 2024-07-25 ENCOUNTER — Ambulatory Visit
